# Patient Record
Sex: Female | Born: 1935 | Race: White | Hispanic: No | Smoking: Former smoker
Health system: Southern US, Community
[De-identification: ages and names within clinical notes are randomized; demographics above are authoritative.]

## PROBLEM LIST (undated history)

## (undated) DIAGNOSIS — I10 Essential (primary) hypertension: Secondary | ICD-10-CM

## (undated) DIAGNOSIS — G4730 Sleep apnea, unspecified: Secondary | ICD-10-CM

## (undated) DIAGNOSIS — I255 Ischemic cardiomyopathy: Secondary | ICD-10-CM

## (undated) DIAGNOSIS — G4733 Obstructive sleep apnea (adult) (pediatric): Secondary | ICD-10-CM

## (undated) DIAGNOSIS — E785 Hyperlipidemia, unspecified: Secondary | ICD-10-CM

## (undated) DIAGNOSIS — Z9221 Personal history of antineoplastic chemotherapy: Secondary | ICD-10-CM

## (undated) DIAGNOSIS — R011 Cardiac murmur, unspecified: Secondary | ICD-10-CM

## (undated) DIAGNOSIS — I472 Ventricular tachycardia: Secondary | ICD-10-CM

## (undated) DIAGNOSIS — M549 Dorsalgia, unspecified: Secondary | ICD-10-CM

## (undated) DIAGNOSIS — R9081 Abnormal echoencephalogram: Secondary | ICD-10-CM

## (undated) DIAGNOSIS — Z923 Personal history of irradiation: Secondary | ICD-10-CM

## (undated) DIAGNOSIS — I252 Old myocardial infarction: Secondary | ICD-10-CM

## (undated) DIAGNOSIS — I350 Nonrheumatic aortic (valve) stenosis: Secondary | ICD-10-CM

## (undated) DIAGNOSIS — I4729 Other ventricular tachycardia: Secondary | ICD-10-CM

## (undated) DIAGNOSIS — R001 Bradycardia, unspecified: Secondary | ICD-10-CM

## (undated) DIAGNOSIS — G8929 Other chronic pain: Secondary | ICD-10-CM

## (undated) DIAGNOSIS — I2510 Atherosclerotic heart disease of native coronary artery without angina pectoris: Secondary | ICD-10-CM

## (undated) DIAGNOSIS — G709 Myoneural disorder, unspecified: Secondary | ICD-10-CM

## (undated) DIAGNOSIS — C55 Malignant neoplasm of uterus, part unspecified: Secondary | ICD-10-CM

## (undated) DIAGNOSIS — M1990 Unspecified osteoarthritis, unspecified site: Secondary | ICD-10-CM

## (undated) DIAGNOSIS — Z9889 Other specified postprocedural states: Secondary | ICD-10-CM

## (undated) DIAGNOSIS — I4821 Permanent atrial fibrillation: Secondary | ICD-10-CM

## (undated) DIAGNOSIS — I272 Pulmonary hypertension, unspecified: Secondary | ICD-10-CM

## (undated) DIAGNOSIS — I219 Acute myocardial infarction, unspecified: Secondary | ICD-10-CM

## (undated) DIAGNOSIS — T451X5A Adverse effect of antineoplastic and immunosuppressive drugs, initial encounter: Secondary | ICD-10-CM

## (undated) DIAGNOSIS — I447 Left bundle-branch block, unspecified: Secondary | ICD-10-CM

## (undated) DIAGNOSIS — G620 Drug-induced polyneuropathy: Secondary | ICD-10-CM

## (undated) HISTORY — DX: Myoneural disorder, unspecified: G70.9

## (undated) HISTORY — PX: ABDOMINAL HYSTERECTOMY: SHX81

## (undated) HISTORY — DX: Other specified postprocedural states: Z98.890

## (undated) HISTORY — DX: Abnormal echoencephalogram: R90.81

## (undated) HISTORY — DX: Dorsalgia, unspecified: M54.9

## (undated) HISTORY — DX: Atherosclerotic heart disease of native coronary artery without angina pectoris: I25.10

## (undated) HISTORY — DX: Ventricular tachycardia: I47.2

## (undated) HISTORY — DX: Malignant neoplasm of uterus, part unspecified: C55

## (undated) HISTORY — DX: Hyperlipidemia, unspecified: E78.5

## (undated) HISTORY — DX: Other chronic pain: G89.29

## (undated) HISTORY — DX: Bradycardia, unspecified: R00.1

## (undated) HISTORY — DX: Other ventricular tachycardia: I47.29

---
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
## 2004-01-28 ENCOUNTER — Ambulatory Visit: Payer: Self-pay | Admitting: Unknown Physician Specialty

## 2005-02-23 ENCOUNTER — Ambulatory Visit: Payer: Self-pay | Admitting: Unknown Physician Specialty

## 2006-02-26 ENCOUNTER — Ambulatory Visit: Payer: Self-pay | Admitting: Unknown Physician Specialty

## 2006-09-19 ENCOUNTER — Ambulatory Visit: Payer: Self-pay | Admitting: Gastroenterology

## 2007-03-07 ENCOUNTER — Ambulatory Visit: Payer: Self-pay | Admitting: Unknown Physician Specialty

## 2008-03-09 ENCOUNTER — Ambulatory Visit: Payer: Self-pay | Admitting: Unknown Physician Specialty

## 2009-03-11 ENCOUNTER — Ambulatory Visit: Payer: Self-pay | Admitting: Unknown Physician Specialty

## 2010-01-10 ENCOUNTER — Ambulatory Visit: Payer: Self-pay | Admitting: Unknown Physician Specialty

## 2010-03-14 ENCOUNTER — Ambulatory Visit: Payer: Self-pay | Admitting: Unknown Physician Specialty

## 2010-10-10 ENCOUNTER — Ambulatory Visit: Payer: Self-pay | Admitting: Unknown Physician Specialty

## 2011-04-04 ENCOUNTER — Ambulatory Visit: Payer: Self-pay | Admitting: Unknown Physician Specialty

## 2012-04-04 ENCOUNTER — Ambulatory Visit: Payer: Self-pay | Admitting: Family Medicine

## 2012-10-19 ENCOUNTER — Emergency Department: Payer: Self-pay | Admitting: Emergency Medicine

## 2012-10-19 ENCOUNTER — Ambulatory Visit (HOSPITAL_COMMUNITY): Admit: 2012-10-19 | Payer: Self-pay | Admitting: Cardiovascular Disease

## 2012-10-19 ENCOUNTER — Encounter (HOSPITAL_COMMUNITY)
Admission: RE | Disposition: A | Discharge status: Home / Self Care | Payer: Self-pay | Attending: Cardiovascular Disease

## 2012-10-19 ENCOUNTER — Inpatient Hospital Stay (HOSPITAL_COMMUNITY)
Admission: RE | Admit: 2012-10-19 | Discharge: 2012-10-25 | DRG: 247 | Disposition: A | Discharge status: Home / Self Care | Payer: Medicare Other | Source: Other Acute Inpatient Hospital | Attending: Cardiovascular Disease | Admitting: Cardiovascular Disease

## 2012-10-19 DIAGNOSIS — I252 Old myocardial infarction: Secondary | ICD-10-CM | POA: Diagnosis present

## 2012-10-19 DIAGNOSIS — I2582 Chronic total occlusion of coronary artery: Secondary | ICD-10-CM | POA: Diagnosis present

## 2012-10-19 DIAGNOSIS — I498 Other specified cardiac arrhythmias: Secondary | ICD-10-CM | POA: Diagnosis not present

## 2012-10-19 DIAGNOSIS — I2102 ST elevation (STEMI) myocardial infarction involving left anterior descending coronary artery: Secondary | ICD-10-CM

## 2012-10-19 DIAGNOSIS — I237 Postinfarction angina: Secondary | ICD-10-CM | POA: Diagnosis not present

## 2012-10-19 DIAGNOSIS — E785 Hyperlipidemia, unspecified: Secondary | ICD-10-CM | POA: Diagnosis present

## 2012-10-19 DIAGNOSIS — I472 Ventricular tachycardia, unspecified: Secondary | ICD-10-CM | POA: Diagnosis not present

## 2012-10-19 DIAGNOSIS — I2510 Atherosclerotic heart disease of native coronary artery without angina pectoris: Secondary | ICD-10-CM | POA: Diagnosis not present

## 2012-10-19 DIAGNOSIS — I4729 Other ventricular tachycardia: Secondary | ICD-10-CM | POA: Diagnosis not present

## 2012-10-19 DIAGNOSIS — I2109 ST elevation (STEMI) myocardial infarction involving other coronary artery of anterior wall: Principal | ICD-10-CM

## 2012-10-19 DIAGNOSIS — I10 Essential (primary) hypertension: Secondary | ICD-10-CM | POA: Diagnosis present

## 2012-10-19 DIAGNOSIS — I119 Hypertensive heart disease without heart failure: Secondary | ICD-10-CM | POA: Diagnosis present

## 2012-10-19 DIAGNOSIS — I2589 Other forms of chronic ischemic heart disease: Secondary | ICD-10-CM | POA: Diagnosis present

## 2012-10-19 DIAGNOSIS — I219 Acute myocardial infarction, unspecified: Secondary | ICD-10-CM

## 2012-10-19 DIAGNOSIS — I255 Ischemic cardiomyopathy: Secondary | ICD-10-CM | POA: Diagnosis present

## 2012-10-19 DIAGNOSIS — I495 Sick sinus syndrome: Secondary | ICD-10-CM | POA: Diagnosis not present

## 2012-10-19 HISTORY — DX: Essential (primary) hypertension: I10

## 2012-10-19 HISTORY — PX: LEFT HEART CATHETERIZATION WITH CORONARY ANGIOGRAM: SHX5451

## 2012-10-19 HISTORY — PX: PERCUTANEOUS CORONARY STENT INTERVENTION (PCI-S): SHX5485

## 2012-10-19 LAB — BASIC METABOLIC PANEL
Anion Gap: 5 — ABNORMAL LOW (ref 7–16)
Calcium, Total: 9.9 mg/dL (ref 8.5–10.1)
Chloride: 103 mmol/L (ref 98–107)
Co2: 29 mmol/L (ref 21–32)
EGFR (African American): >60
EGFR (Non-African Amer.): 52 — ABNORMAL LOW
Glucose: 136 mg/dL — ABNORMAL HIGH (ref 65–99)
Potassium: 3.7 mmol/L (ref 3.5–5.1)
Sodium: 137 mmol/L (ref 136–145)

## 2012-10-19 LAB — CBC
HCT: 41.4 % (ref 35.0–47.0)
HGB: 14 g/dL (ref 12.0–16.0)
MCH: 29.8 pg (ref 26.0–34.0)
MCHC: 34 g/dL (ref 32.0–36.0)
Platelet: 258 10*3/uL (ref 150–440)
RBC: 4.71 10*6/uL (ref 3.80–5.20)
RDW: 13.3 % (ref 11.5–14.5)
WBC: 12 10*3/uL — ABNORMAL HIGH (ref 3.6–11.0)

## 2012-10-19 LAB — CK TOTAL AND CKMB (NOT AT ARMC): CK-MB: 63.8 ng/mL — ABNORMAL HIGH (ref 0.5–3.6)

## 2012-10-19 SURGERY — LEFT HEART CATHETERIZATION WITH CORONARY ANGIOGRAM

## 2012-10-19 MED ORDER — NITROGLYCERIN 0.4 MG SL SUBL
0.4000 mg | SUBLINGUAL_TABLET | SUBLINGUAL | Status: DC | PRN
  Administered 2012-10-21 (×2): 0.4 mg via SUBLINGUAL

## 2012-10-19 MED ORDER — TICAGRELOR 90 MG PO TABS
ORAL_TABLET | ORAL | Status: AC
  Administered 2012-10-20: 90 mg via ORAL
  Filled 2012-10-19: qty 2

## 2012-10-19 MED ORDER — FENTANYL CITRATE 0.05 MG/ML IJ SOLN
INTRAMUSCULAR | Status: AC
  Filled 2012-10-19: qty 2

## 2012-10-19 MED ORDER — ONDANSETRON HCL 4 MG/2ML IJ SOLN: 4.0000 mg | Freq: Four times a day (QID) | INTRAMUSCULAR | Status: DC | PRN

## 2012-10-19 MED ORDER — NITROGLYCERIN IN D5W 200-5 MCG/ML-% IV SOLN
INTRAVENOUS | Status: AC
  Filled 2012-10-19: qty 250

## 2012-10-19 MED ORDER — ATORVASTATIN CALCIUM 80 MG PO TABS: 80.0000 mg | ORAL_TABLET | Freq: Every day | ORAL | Status: DC

## 2012-10-19 MED ORDER — HEPARIN (PORCINE) IN NACL 2-0.9 UNIT/ML-% IJ SOLN
INTRAMUSCULAR | Status: AC
  Filled 2012-10-19: qty 1000

## 2012-10-19 MED ORDER — NITROGLYCERIN IN D5W 200-5 MCG/ML-% IV SOLN
2.0000 ug/min | INTRAVENOUS | Status: DC
  Filled 2012-10-19: qty 250

## 2012-10-19 MED ORDER — ASPIRIN EC 81 MG PO TBEC: 81.0000 mg | DELAYED_RELEASE_TABLET | Freq: Every day | ORAL | Status: DC

## 2012-10-19 MED ORDER — ATORVASTATIN CALCIUM 80 MG PO TABS
80.0000 mg | ORAL_TABLET | Freq: Every day | ORAL | Status: DC
  Administered 2012-10-20 – 2012-10-24 (×5): 80 mg via ORAL
  Filled 2012-10-19 (×6): qty 1

## 2012-10-19 MED ORDER — ACETAMINOPHEN 325 MG PO TABS: 650.0000 mg | ORAL_TABLET | ORAL | Status: DC | PRN

## 2012-10-19 MED ORDER — ACETAMINOPHEN 325 MG PO TABS
650.0000 mg | ORAL_TABLET | ORAL | Status: DC | PRN
  Administered 2012-10-20: 650 mg via ORAL
  Filled 2012-10-19: qty 2

## 2012-10-19 MED ORDER — LISINOPRIL 2.5 MG PO TABS
2.5000 mg | ORAL_TABLET | Freq: Every day | ORAL | Status: DC
  Administered 2012-10-20 – 2012-10-24 (×5): 2.5 mg via ORAL
  Filled 2012-10-19 (×6): qty 1

## 2012-10-19 MED ORDER — BIVALIRUDIN 250 MG IV SOLR
INTRAVENOUS | Status: AC
  Filled 2012-10-19: qty 250

## 2012-10-19 MED ORDER — METOPROLOL TARTRATE 25 MG PO TABS
25.0000 mg | ORAL_TABLET | Freq: Two times a day (BID) | ORAL | Status: DC
  Administered 2012-10-20 – 2012-10-23 (×7): 25 mg via ORAL
  Filled 2012-10-19 (×9): qty 1

## 2012-10-19 MED ORDER — TICAGRELOR 90 MG PO TABS
90.0000 mg | ORAL_TABLET | Freq: Two times a day (BID) | ORAL | Status: DC
  Administered 2012-10-20 – 2012-10-23 (×7): 90 mg via ORAL
  Filled 2012-10-19 (×9): qty 1

## 2012-10-19 MED ORDER — SODIUM CHLORIDE 0.9 % IV SOLN
INTRAVENOUS | Status: DC
  Administered 2012-10-20: 02:00:00 via INTRAVENOUS

## 2012-10-19 MED ORDER — NITROGLYCERIN 0.2 MG/ML ON CALL CATH LAB
INTRAVENOUS | Status: AC
  Filled 2012-10-19: qty 1

## 2012-10-19 MED ORDER — ASPIRIN EC 81 MG PO TBEC
81.0000 mg | DELAYED_RELEASE_TABLET | Freq: Every day | ORAL | Status: DC
  Administered 2012-10-20 – 2012-10-23 (×4): 81 mg via ORAL
  Filled 2012-10-19 (×4): qty 1

## 2012-10-19 MED ORDER — ENOXAPARIN SODIUM 40 MG/0.4ML ~~LOC~~ SOLN
40.0000 mg | SUBCUTANEOUS | Status: DC
  Filled 2012-10-19: qty 0.4

## 2012-10-19 MED ORDER — MIDAZOLAM HCL 2 MG/2ML IJ SOLN
INTRAMUSCULAR | Status: AC
  Filled 2012-10-19: qty 2

## 2012-10-19 MED ORDER — SODIUM CHLORIDE 0.9 % IV SOLN: 0.2500 mg/kg/h | INTRAVENOUS | Status: AC

## 2012-10-19 MED ORDER — LIDOCAINE HCL (PF) 1 % IJ SOLN
INTRAMUSCULAR | Status: AC
  Filled 2012-10-19: qty 30

## 2012-10-19 NOTE — Cardiovascular Report (Signed)
Susan [name], Susan [name]              ACCOUNT NO.:  [id number]  MEDICAL RECORD NO.:  [id number]  LOCATION:  2908                         FACILITY:  [facility]  PHYSICIAN:  Nicki Guadalajara, M.D.     DATE OF BIRTH:  November 07, 1934  DATE OF PROCEDURE:  10/19/2012 DATE OF DISCHARGE:                           CARDIAC CATHETERIZATION   PROCEDURES:  Emergent cardiac catheterization:  Cine coronary angiography; percutaneous coronary intervention to a totally occluded left anterior descending coronary artery with percutaneous transluminal coronary angioplasty stenting; left ventriculography.  INDICATIONS:  [name] is a very pleasant 78 year old female who lives in [place], [place].  Apparently, she developed back discomfort since 8 a.m. this morning.  She felt this was due to muscle spasm or strain.  However, the pain kept intensifying throughout the day.  Finally, this evening, she presented to [facility] Emergency Room where she was found to have acute ST-segment anterior wall elevation and a code STEMI was called.  The patient was treated with heparin and sublingual nitroglycerin.  She was transported to the [facility] by EMS and taken directly to the cardiac catheterization laboratory.  DESCRIPTION OF PROCEDURE:  Upon arrival to the catheterization laboratory, the patient was still having at least 5/10 chest, back discomfort.  Her EKG en route had shown at least 4 to 5 mm of ST-segment elevation anteriorly with Q-waves V1 through V5.  She was given 2 mg Versed and 50 mcg of fentanyl for conscious sedation.  Right femoral artery was punctured anteriorly and a 6-French sheath was inserted without difficulty.  Catheterization was done utilizing 6-French diagnostic test FL-4 and FR-4 catheters.  With a demonstration of total proximal-to-mid LAD occlusion with disease in the ostial proximal LAD prior to the total occlusion, we proceeded to perform percutaneous coronary intervention.   Angiomax bolus plus infusion was administered. 180 mg of oral Brilinta was given.  A 6-French XB LAD 3.5 guide was used.  A ChoICE PT wire was advanced into the LAD, was able to cross the total occlusion and was advanced to the LV apex.  A 2.5 x 15 mm Sprinter balloon was dilated at the site of total occlusion.  Two inflations at 16 atmospheres were made.  Since the patient did have significant proximal disease prior to this, it was felt that the entire region needed to be stented.  A 3.0 x 38 mm Promus Premier DES stent was then inserted to cover the most distal aspect of the lesion in the mid segment.  This covered the majority of the proximal stenoses, but there was still an area of proximal stenosis extending to the LAD ostium which needed to be stented.  A 3.0 x 16 mm Promus Premier DES stent was then inserted with careful attention, so as the left circumflex was not jailed.  The stent was advanced to the ostium.  This was dilated x2 up to 12 atmospheres.  A 3.25 x 20 mm noncompliant Trek was used for post stent dilatation in the entire stented segment.  Scout angiography confirmed an excellent angiographic result with all stenoses being reduced to 0%.  There was brisk TIMI-3 flow.  There was no evidence for dissection.  The wire  balloon and catheter were then removed.  A 6- French pigtail catheter was then advanced into the LV.  RAO ventriculography was performed.  An LVAO pullback was performed.  During the procedure, the patient did receive several doses of intracoronary nitroglycerin.  She tolerated the procedure well.  At the end of the procedure, she had stable hemodynamics and then essentially was chest pain free, although did have some mild residual faint back discomfort. The arterial sheath was sutured in place with plans for sheath removal 2 hours after Angiomax is discontinued.  The Angiomax will be continued at reduced dose for 4 additional hours following the acute  intervention.  HEMODYNAMIC DATA:  Upon arrival to the catheterization laboratory, her blood pressure initially was 143/74.  On pullback, left ventricular pressure was 152/21, post A-wave 31, and AO pressure 152/79.  ANGIOGRAPHIC DATA:  Left main coronary artery was angiographically normal and bifurcated into an LAD and left circumflex system.  The LAD had 60% to 70% smooth ostial narrowing followed by 70% and then 80% stenosis before the first septal perforating artery.  The LAD after the septal perforating artery was occluded with initial TIMI 0 flow.  The circumflex vessel was moderate-sized vessel.  This gave rise to upper takeoff marginal branch, but seemed to have some collateral filling from this.  The mid AV groove circumflex had 95% focal stenosis followed by 30% to 40% stenosis before the OM-2 vessel. The right coronary artery was a large caliber very dominant vessel.  It had a large PDA inferior LV branch and large PLA system which extended to the apex.  The proximal portion of the right coronary artery had 50% to 60% narrowing.  There was diffuse narrowing of 20% to 30% in the mid segment.  Following percutaneous coronary intervention with PTCA and stenting of the LAD with Tandem 3.0 x 38 mm stent and the more proximal 3.0 x 16 mm Promus Premier DES stent.  The entire stenoses from the ostium to the point of previous total occlusion were reduced to 0%.  There was brisk TIMI-3 flow.  There was no evidence for dissection.  The patient arrived at the catheterization laboratory at 2039.  Time of the first balloon inflation was 2106, giving an arrival to balloon inflation at 27 minutes.  RAO ventriculography revealed moderate LV acute LV dysfunction with an ejection fraction of approximately 35% to 40%.  There was severe hypo-to-akinesis involving the mid distal anterolateral wall, extending around the apex into the apical inferior segment with possible small region of apical  dyskinesis.  The basal segments were vigorously hypercontractile.  IMPRESSION: 1. Acute ST-segment elevation myocardial infarction secondary to total     occlusion of the proximal to mid left anterior descending artery. 2. Significant multivessel coronary artery disease with segmental left     anterior descending coronary artery narrowings of 60% to 70%     ostially, 70% and 80% prior to the septal perforating artery with     total occlusion after the septal perforating artery with initial     TIMI 0 flow. 3. 95% mid AV groove circumflex stenosis. 4. Large dominant right coronary artery with 50% to 60% proximal     stenosis and 30% irregularity in the mid segment in a very large     right coronary artery. 5. Successful percutaneous coronary intervention of the left anterior     descending artery with percutaneous transluminal coronary     angioplasty and ultimate insertion of Tandem 3.0 x 38 mm  and 3.0 x     16 mm Promus Premier DES stents with all lesions being reduced to     0% and resulted in TIMI-3 flow. 6. Total balloon time 27 minutes. 7. Anticoagulation with IV Angiomax and 180 mg of oral Brilinta. 8. Acute LV dysfunction with ejection fraction of 35% to 40% with     extensive mid distal anterolateral, apical, and apical inferior     severe hypocontractility, hypo-to-akinesis with small area of     possible apical dyskinesis.         ______________________________ Nicki Guadalajara, M.D.    TK/MEDQ  D:  10/19/2012  T:  10/19/2012  Job:  [id number]

## 2012-10-19 NOTE — Progress Notes (Signed)
Pt unable to void on bedpan. Bladder scan done 670 ml noted on scan. Dr. Tresa Endo at bedside. Okay to insert FC until BR is up.

## 2012-10-19 NOTE — H&P (Signed)
Susan [name] is an 78 y.o. female.    Chief Complaint: Chest Pain  HPI: 78 y/o female with a PMH of HTN presenting as a transfer from [facility] where she was originally seen for chest pain evaluation. Her chest pain started about 8 am on the day of presentation. It is described as a sharp pain 6/10 in severity and associated with shortness of breath and nausea.  She denied diaphoresis, palpitation or syncope.  At the outside hospital her EKG obtained 10/19/2012 at 20:35 showed sinus rhythm with 1st degree AVB and 3 mm ST elevation is leads V2-V5.  She was subsequently transferred to [facility] cath lab in consideration of cardiac catheterization. She is currently complaining of 5/10 chest pain, but is hemodynamically stable.  No past medical history on file. HTN  No past surgical history on file. None  No family history on file. Social History:  has no tobacco, alcohol, and drug history on file. Denies tobacco abuse of IV drug abuse  Allergies: Allergies not on file Penicillin   No prescriptions prior to admission   Medication: Not available  No results found for this or any previous visit (from the past 48 hour(s)). No results found. Labs currently pending  Review of Systems  Constitutional: Negative for fever, chills, weight loss, malaise/fatigue and diaphoresis.  HENT: Negative for hearing loss, ear pain, nosebleeds, congestion, sore throat, neck pain, tinnitus and ear discharge.   Eyes: Negative for pain.  Respiratory: Positive for shortness of breath. Negative for cough, hemoptysis, sputum production and wheezing.   Cardiovascular: Positive for chest pain. Negative for palpitations, orthopnea, claudication, leg swelling and PND.  Gastrointestinal: Positive for nausea. Negative for heartburn, vomiting, abdominal pain, diarrhea and constipation.  Genitourinary: Negative for dysuria, urgency, frequency, hematuria and flank pain.   Musculoskeletal: Negative for myalgias and back pain.  Skin: Negative for itching and rash.  Neurological: Negative for dizziness, tingling, tremors, sensory change, speech change, focal weakness, weakness and headaches.  Psychiatric/Behavioral: Negative for hallucinations.   T 97.8  BP 155/78 P 72 R18 O2 saturation 95% on 2 L nasal canula  There were no vitals taken for this visit. Physical Exam  Constitutional: She is oriented to person, place, and time. She appears well-developed and well-nourished. No distress.  HENT:  Head: Normocephalic.  Eyes: Pupils are equal, round, and reactive to light. Right eye exhibits no discharge. Left eye exhibits no discharge. No scleral icterus.  Neck: No JVD present. No tracheal deviation present. No thyromegaly present.  Cardiovascular: Normal rate, regular rhythm and normal heart sounds.  Exam reveals no gallop and no friction rub.   Respiratory: Effort normal and breath sounds normal. No stridor. No respiratory distress. She has no wheezes. She has no rales. She exhibits no tenderness.  GI: Soft. Bowel sounds are normal. She exhibits no distension. There is no tenderness. There is no rebound and no guarding.  Musculoskeletal: She exhibits no edema and no tenderness.  Neurological: She is alert and oriented to person, place, and time. No cranial nerve deficit. Coordination normal.  Skin: No rash noted. She is not diaphoretic. No erythema.  Psychiatric: She has a normal mood and affect.     Assessment/Plan  1. STEMI: Patient was transferred from [facility] strait to our cardiac catheterization lab for diagnostic and interventional procedure.  She received Heparin and SL nitroglycerin en-route to [facility], and she is hemodynamically stable.  We will start patient on dual anti-platelet medication, Metoprolol 25  mg bid, Lipitor 80 mg qhs and prn Nitrates for pain control.  We will obtain CBC, CMP, serial cardiac markers and obtain a TTE  in the morning to evaluate her LV function.    2.  HTN: Currently elevated in the setting of chest pain. We will start Lisinopril and re-evaluate after cardiac catheterization.    Dhilan Brauer E 10/19/2012, 9:18 PM

## 2012-10-20 ENCOUNTER — Encounter (HOSPITAL_COMMUNITY): Payer: Self-pay | Admitting: *Deleted

## 2012-10-20 DIAGNOSIS — I2109 ST elevation (STEMI) myocardial infarction involving other coronary artery of anterior wall: Secondary | ICD-10-CM

## 2012-10-20 DIAGNOSIS — I119 Hypertensive heart disease without heart failure: Secondary | ICD-10-CM | POA: Diagnosis present

## 2012-10-20 DIAGNOSIS — I10 Essential (primary) hypertension: Secondary | ICD-10-CM

## 2012-10-20 DIAGNOSIS — I252 Old myocardial infarction: Secondary | ICD-10-CM | POA: Diagnosis present

## 2012-10-20 DIAGNOSIS — I517 Cardiomegaly: Secondary | ICD-10-CM

## 2012-10-20 LAB — TSH: TSH: 0.754 u[IU]/mL (ref 0.350–4.500)

## 2012-10-20 LAB — T4, FREE: Free T4: 1.01 ng/dL (ref 0.80–1.80)

## 2012-10-20 LAB — CBC
MCH: 30 pg (ref 26.0–34.0)
MCH: 30 pg (ref 26.0–34.0)
MCHC: 34.1 g/dL (ref 30.0–36.0)
MCV: 87.8 fL (ref 78.0–100.0)
MCV: 87.9 fL (ref 78.0–100.0)
Platelets: 221 10*3/uL (ref 150–400)
Platelets: 236 10*3/uL (ref 150–400)
RBC: 4.34 MIL/uL (ref 3.87–5.11)
RDW: 13.1 % (ref 11.5–15.5)
WBC: 11.1 10*3/uL — ABNORMAL HIGH (ref 4.0–10.5)

## 2012-10-20 LAB — LIPID PANEL
Cholesterol: 170 mg/dL (ref 0–200)
HDL: 40 mg/dL (ref >39)
Total CHOL/HDL Ratio: 4.3 RATIO
Triglycerides: 202 mg/dL — ABNORMAL HIGH (ref <150)
VLDL: 40 mg/dL (ref 0–40)

## 2012-10-20 LAB — COMPREHENSIVE METABOLIC PANEL
ALT: 25 U/L (ref 0–35)
AST: 148 U/L — ABNORMAL HIGH (ref 0–37)
Albumin: 3.6 g/dL (ref 3.5–5.2)
Alkaline Phosphatase: 88 U/L (ref 39–117)
BUN: 18 mg/dL (ref 6–23)
Chloride: 100 mEq/L (ref 96–112)
Potassium: 4.1 mEq/L (ref 3.5–5.1)
Sodium: 134 mEq/L — ABNORMAL LOW (ref 135–145)
Total Bilirubin: 0.3 mg/dL (ref 0.3–1.2)
Total Protein: 6.5 g/dL (ref 6.0–8.3)

## 2012-10-20 LAB — BASIC METABOLIC PANEL
CO2: 27 mEq/L (ref 19–32)
Calcium: 9.6 mg/dL (ref 8.4–10.5)
Creatinine, Ser: 0.82 mg/dL (ref 0.50–1.10)
GFR calc non Af Amer: 68 mL/min — ABNORMAL LOW (ref >90)
Glucose, Bld: 109 mg/dL — ABNORMAL HIGH (ref 70–99)
Sodium: 136 mEq/L (ref 135–145)

## 2012-10-20 LAB — HEMOGLOBIN A1C: Mean Plasma Glucose: 117 mg/dL — ABNORMAL HIGH (ref <117)

## 2012-10-20 LAB — TROPONIN I
Troponin I: >20 ng/mL (ref <0.30)
Troponin I: >20 ng/mL (ref <0.30)
Troponin I: >20 ng/mL (ref <0.30)

## 2012-10-20 LAB — MRSA PCR SCREENING: MRSA by PCR: NEGATIVE

## 2012-10-20 MED ORDER — ATROPINE SULFATE 1 MG/ML IJ SOLN
INTRAMUSCULAR | Status: AC
  Filled 2012-10-20: qty 1

## 2012-10-20 MED ORDER — MORPHINE SULFATE 2 MG/ML IJ SOLN: 1.0000 mg | INTRAMUSCULAR | Status: DC | PRN

## 2012-10-20 NOTE — Plan of Care (Signed)
Attempted to place [facility] x2 with 2 different nurses, attempts  unsuccessful. Pt wanted to try to use bedpan again. Pt was able to void 700 ml of urine at this time. Foley cath not placed. Will continue to monitor. Peri care performed before and after each cath attempt. Pt tolerated well.

## 2012-10-20 NOTE — Progress Notes (Signed)
Utilization Review Completed.Susan [name] T7/27/2014  

## 2012-10-20 NOTE — Progress Notes (Signed)
*  PRELIMINARY RESULTS* Echocardiogram 2D Echocardiogram has been performed.  Jeryl Columbia 10/20/2012, 11:24 AM

## 2012-10-20 NOTE — Progress Notes (Signed)
DAILY PROGRESS NOTE  Subjective:  Events noted overnight - anterior STEMI.  She had resolution of her upper back pain after cath (it was 9/10 originally) ... This morning her pain has returned (4/10) and she is hypotensive.  She had PCI to the LAD, but had residual LCx disease, which was a small vessel.  Objective:  Temp:  [97.7 F (36.5 C)-97.8 F (36.6 C)] 97.7 F (36.5 C) (07/27 0800) Pulse Rate:  [43-68] 55 (07/27 0900) Resp:  [11-22] 15 (07/27 0900) BP: (86-142)/(36-88) 86/44 mmHg (07/27 0900) SpO2:  [94 %-100 %] 99 % (07/27 0900) Weight:  [226 lb 3.1 oz (102.6 kg)] 226 lb 3.1 oz (102.6 kg) (07/26 2300) Weight change:   Intake/Output from previous day: 07/26 0701 - 07/27 0700 In: 984.2 [P.O.:240; I.V.:744.2] Out: 700 [Urine:700]  Intake/Output from this shift: Total I/O In: 270 [P.O.:120; I.V.:150] Out: -   Medications: Current Facility-Administered Medications  Medication Dose Route Frequency Provider Last Rate Last Dose  . 0.9 %  sodium chloride infusion   Intravenous Continuous Lennette Bihari, MD 75 mL/hr at 10/20/12 0143    . acetaminophen (TYLENOL) tablet 650 mg  650 mg Oral Q4H PRN Lennette Bihari, MD      . aspirin EC tablet 81 mg  81 mg Oral Daily Lennette Bihari, MD      . atorvastatin (LIPITOR) tablet 80 mg  80 mg Oral q1800 Lennette Bihari, MD      . lisinopril (PRINIVIL,ZESTRIL) tablet 2.5 mg  2.5 mg Oral Daily Lennette Bihari, MD   2.5 mg at 10/20/12 0020  . metoprolol tartrate (LOPRESSOR) tablet 25 mg  25 mg Oral BID Lennette Bihari, MD   25 mg at 10/20/12 0019  . nitroGLYCERIN (NITROSTAT) SL tablet 0.4 mg  0.4 mg Sublingual Q5 Min x 3 PRN Lennette Bihari, MD      . nitroGLYCERIN 0.2 mg/mL in dextrose 5 % infusion  2-200 mcg/min Intravenous Continuous Lennette Bihari, MD   10 mcg/min at 10/20/12 0200  . ondansetron (ZOFRAN) injection 4 mg  4 mg Intravenous Q6H PRN Lennette Bihari, MD      . Ticagrelor [facility]) tablet 90 mg  90 mg Oral BID Lennette Bihari, MD   90  mg at 10/20/12 0020    Physical Exam: General appearance: alert and mild distress Neck: no adenopathy, no carotid bruit, no JVD, supple, symmetrical, trachea midline and thyroid not enlarged, symmetric, no tenderness/mass/nodules Lungs: clear to auscultation bilaterally Heart: bradycardic Abdomen: soft, non-tender; bowel sounds normal; no masses,  no organomegaly Extremities: extremities normal, atraumatic, no cyanosis or edema Pulses: 2+ and symmetric Skin: Skin color, texture, turgor normal. No rashes or lesions Neurologic: Grossly normal  Lab Results: Results for orders placed during the hospital encounter of 10/19/12 (from the past 48 hour(s))  MRSA PCR SCREENING     Status: None   Collection Time    10/19/12 10:43 PM      Result Value Range   MRSA by PCR NEGATIVE  NEGATIVE   Comment:            The GeneXpert MRSA Assay (FDA     approved for NASAL specimens     only), is one component of a     comprehensive MRSA colonization     surveillance program. It is not     intended to diagnose MRSA     infection nor to guide or     monitor treatment for  MRSA infections.  TROPONIN I     Status: Abnormal   Collection Time    10/19/12 11:50 PM      Result Value Range   Troponin I >20.00 (*) <0.30 ng/mL   Comment:            Due to the release kinetics of cTnI,     a negative result within the first hours     of the onset of symptoms does not rule out     myocardial infarction with certainty.     If myocardial infarction is still suspected,     repeat the test at appropriate intervals.     CRITICAL RESULT CALLED TO, READ BACK BY AND VERIFIED WITH:     STOWE [facility] 10/20/12 0042 WAYK  PROTIME-INR     Status: Abnormal   Collection Time    10/19/12 11:50 PM      Result Value Range   Prothrombin Time 20.9 (*) 11.6 - 15.2 seconds   INR 1.86 (*) 0.00 - 1.49  APTT     Status: Abnormal   Collection Time    10/19/12 11:50 PM      Result Value Range   aPTT 91 (*) 24 - 37 seconds    Comment:            IF BASELINE aPTT IS ELEVATED,     SUGGEST PATIENT RISK ASSESSMENT     BE USED TO DETERMINE APPROPRIATE     ANTICOAGULANT THERAPY.  COMPREHENSIVE METABOLIC PANEL     Status: Abnormal   Collection Time    10/19/12 11:50 PM      Result Value Range   Sodium 134 (*) 135 - 145 mEq/L   Potassium 4.1  3.5 - 5.1 mEq/L   Chloride 100  96 - 112 mEq/L   CO2 25  19 - 32 mEq/L   Glucose, Bld 128 (*) 70 - 99 mg/dL   BUN 18  6 - 23 mg/dL   Creatinine, Ser 4.13  0.50 - 1.10 mg/dL   Calcium 9.7  8.4 - 24.4 mg/dL   Total Protein 6.5  6.0 - 8.3 g/dL   Albumin 3.6  3.5 - 5.2 g/dL   AST 010 (*) 0 - 37 U/L   ALT 25  0 - 35 U/L   Alkaline Phosphatase 88  39 - 117 U/L   Total Bilirubin 0.3  0.3 - 1.2 mg/dL   GFR calc non Af Amer 80 (*) >90 mL/min   GFR calc Af Amer >90  >90 mL/min   Comment:            The eGFR has been calculated     using the CKD EPI equation.     This calculation has not been     validated in all clinical     situations.     eGFR's persistently     <90 mL/min signify     possible Chronic Kidney Disease.  MAGNESIUM     Status: None   Collection Time    10/19/12 11:50 PM      Result Value Range   Magnesium 2.1  1.5 - 2.5 mg/dL  PRO B NATRIURETIC PEPTIDE     Status: Abnormal   Collection Time    10/19/12 11:50 PM      Result Value Range   Pro B Natriuretic peptide (BNP) 1299.0 (*) 0 - 450 pg/mL  CBC     Status: Abnormal   Collection Time    10/19/12 11:50  PM      Result Value Range   WBC 11.1 (*) 4.0 - 10.5 K/uL   RBC 4.34  3.87 - 5.11 MIL/uL   Hemoglobin 13.0  12.0 - 15.0 g/dL   HCT 16.1  09.6 - 04.5 %   MCV 87.8  78.0 - 100.0 fL   MCH 30.0  26.0 - 34.0 pg   MCHC 34.1  30.0 - 36.0 g/dL   RDW 40.9  81.1 - 91.4 %   Platelets 236  150 - 400 K/uL  LIPID PANEL     Status: Abnormal   Collection Time    10/20/12  4:20 AM      Result Value Range   Cholesterol 170  0 - 200 mg/dL   Triglycerides 782 (*) <150 mg/dL   HDL 40  >95 mg/dL   Total  CHOL/HDL Ratio 4.3     VLDL 40  0 - 40 mg/dL   LDL Cholesterol 90  0 - 99 mg/dL   Comment:            Total Cholesterol/HDL:CHD Risk     Coronary Heart Disease Risk Table                         Men   Women      1/2 Average Risk   3.4   3.3      Average Risk       5.0   4.4      2 X Average Risk   9.6   7.1      3 X Average Risk  23.4   11.0                Use the calculated Patient Ratio     above and the CHD Risk Table     to determine the patient's CHD Risk.                ATP III CLASSIFICATION (LDL):      <100     mg/dL   Optimal      621-308  mg/dL   Near or Above                        Optimal      130-159  mg/dL   Borderline      657-846  mg/dL   High      >962     mg/dL   Very High  CBC     Status: None   Collection Time    10/20/12  4:20 AM      Result Value Range   WBC 9.6  4.0 - 10.5 K/uL   RBC 4.20  3.87 - 5.11 MIL/uL   Hemoglobin 12.6  12.0 - 15.0 g/dL   HCT 95.2  84.1 - 32.4 %   MCV 87.9  78.0 - 100.0 fL   MCH 30.0  26.0 - 34.0 pg   MCHC 34.1  30.0 - 36.0 g/dL   RDW 40.1  02.7 - 25.3 %   Platelets 221  150 - 400 K/uL  BASIC METABOLIC PANEL     Status: Abnormal   Collection Time    10/20/12  4:20 AM      Result Value Range   Sodium 136  135 - 145 mEq/L   Potassium 3.9  3.5 - 5.1 mEq/L   Chloride 101  96 - 112 mEq/L   CO2 27  19 -  32 mEq/L   Glucose, Bld 109 (*) 70 - 99 mg/dL   BUN 17  6 - 23 mg/dL   Creatinine, Ser 1.61  0.50 - 1.10 mg/dL   Calcium 9.6  8.4 - 09.6 mg/dL   GFR calc non Af Amer 68 (*) >90 mL/min   GFR calc Af Amer 79 (*) >90 mL/min   Comment:            The eGFR has been calculated     using the CKD EPI equation.     This calculation has not been     validated in all clinical     situations.     eGFR's persistently     <90 mL/min signify     possible Chronic Kidney Disease.    Imaging: No results found.  Assessment:  Principal Problem:   ST elevation (STEMI) myocardial infarction involving left anterior descending  coronary artery Active Problems:   HTN (hypertension)   Plan:  1. Anterior STEMI with resolution of upper back/chest pain, however, it may have returned. EKG this am shows worsening ST elevation in V2-V5 (about 2mm higher) than the post-PCI EKG. Awaiting cardiac enyzymes. She is hypotensive and bradycardic. Will d/w Dr. Tresa Endo, however, may have to go back to the cath lab this am. Please keep NPO.  Time Spent Directly with Patient:  15 minutes  Length of Stay:  LOS: 1 day   Chrystie Nose, MD, [facility] Attending Cardiologist [facility]  HILTY,Kenneth C 10/20/2012, 9:13 AM

## 2012-10-20 NOTE — Progress Notes (Signed)
Reviewed recurrent EKG's with Dr. Tresa Endo (interventional cardiology). He has examined the patient and feels that she is has not occluded the stent, despite mild re-development of upper back pain and increased ST segments. He suspects this could be evolving LV aneurysm.  Troponin remains >20. Will Rx for low dose morphine for pain.  Chrystie Nose, MD, [facility] Attending Cardiologist [facility]

## 2012-10-20 NOTE — Progress Notes (Signed)
Right femoral 6 FR sheath removed. Manual pressure held for 30 mins., hemostasis achieved. Pt had Slight oozing at beginning of pull. Vital signs remained stable during procedure (See flowsheet). Gauze pressure dressing applied. Groin site level 0 before and after pull. Pt educated on site care. Pt demonstrated and verbalized understanding. Pt tolerated well. Will monitor.

## 2012-10-20 NOTE — CV Procedure (Addendum)
This procedure was performed by Dr. Tresa Endo, the note was mistakenly placed in the wrong chart. I have moved it to the proper chart and deleted the incorrectly placed note.  -Dr. Rennis Golden   Emergency Catheterization/PCI of LAD  Susan [name], 78 y.o., female  Full note dictated; see diagram in chart  DICTATION # [id number]  Ao: 152/79 LV: 152/21/31  LM: nl LAD: 60 - 70% ostial, 70 and 80 % proximal stenosis before SP1 and totally occluded post septal with TIMI  0 flow. LCX: 95% mid AV groove between OM1 and OM2 RCA: large dominant vessel with 50 - 60% proximal stenosis and 30% mid stenosis.  PCI of LAD: 6 F XB 3.5 LAD guide, Choice PT wire, 2.5 x 15 Sprinter balloon, and tandem 3.0 x 38 and 3.0 x 16 Promus Premier DES stents post dilated with 3.25 x 20 [place] Trek from mid LAD to ostium with all lesions reduced to 0% and resumption of TIMI 3 flow.   LV fxn: EF 35 - 405 with severe hypo-ak of mid-distal anterolateral wall extending around apex to inferoapical segment with possible focal apical dyskinesis.  DTB time from cath lab arrival: 27 minutes  Angiomax, 180 mg brilinta, NTG.   May need staged PCI to LCX lesion.  Lennette Bihari., MD, [facility] 10/20/2012 8:25 AM

## 2012-10-21 DIAGNOSIS — I237 Postinfarction angina: Secondary | ICD-10-CM | POA: Diagnosis not present

## 2012-10-21 DIAGNOSIS — I209 Angina pectoris, unspecified: Secondary | ICD-10-CM

## 2012-10-21 LAB — BASIC METABOLIC PANEL
BUN: 17 mg/dL (ref 6–23)
CO2: 29 mEq/L (ref 19–32)
Chloride: 103 mEq/L (ref 96–112)
Glucose, Bld: 125 mg/dL — ABNORMAL HIGH (ref 70–99)
Potassium: 3.8 mEq/L (ref 3.5–5.1)

## 2012-10-21 MED ORDER — SODIUM CHLORIDE 0.9 % IV SOLN: INTRAVENOUS | Status: DC | PRN

## 2012-10-21 MED ORDER — HEPARIN BOLUS VIA INFUSION
4000.0000 [IU] | Freq: Once | INTRAVENOUS | Status: AC
  Administered 2012-10-21: 4000 [IU] via INTRAVENOUS
  Filled 2012-10-21: qty 4000

## 2012-10-21 MED ORDER — MAGNESIUM HYDROXIDE 400 MG/5ML PO SUSP
30.0000 mL | Freq: Every day | ORAL | Status: DC | PRN
  Administered 2012-10-21: 30 mL via ORAL
  Filled 2012-10-21: qty 30

## 2012-10-21 MED ORDER — HEPARIN (PORCINE) IN NACL 100-0.45 UNIT/ML-% IJ SOLN
1300.0000 [IU]/h | INTRAMUSCULAR | Status: DC
  Administered 2012-10-21: 1100 [IU]/h via INTRAVENOUS
  Administered 2012-10-22: 1300 [IU]/h via INTRAVENOUS
  Filled 2012-10-21 (×2): qty 250

## 2012-10-21 MED ORDER — NITROGLYCERIN IN D5W 200-5 MCG/ML-% IV SOLN
2.0000 ug/min | INTRAVENOUS | Status: DC
  Administered 2012-10-21: 10 ug/min via INTRAVENOUS

## 2012-10-21 MED FILL — Sodium Chloride IV Soln 0.9%: INTRAVENOUS | Qty: 50 | Status: AC

## 2012-10-21 NOTE — Progress Notes (Signed)
ANTICOAGULATION CONSULT NOTE - Initial Consult  Pharmacy Consult for Heparin Indication: chest pain/ACS  Allergies  Allergen Reactions  . Penicillins Rash    Patient Measurements: Height: 5\' 5"  (165.1 cm) Weight: 226 lb 3.1 oz (102.6 kg) IBW/kg (Calculated) : 57 Heparin Dosing Weight: 83kg  Vital Signs: Temp: 98.2 F (36.8 C) (07/28 1251) Temp src: Oral (07/28 1251) BP: 109/43 mmHg (07/28 1510)  Labs:  Recent Labs  10/19/12 2350 10/20/12 0420  10/20/12 1421 10/20/12 2002 10/21/12 1058  HGB 13.0 12.6  --   --   --   --   HCT 38.1 36.9  --   --   --   --   PLT 236 221  --   --   --   --   APTT 91*  --   --   --   --   --   LABPROT 20.9*  --   --   --   --   --   INR 1.86*  --   --   --   --   --   CREATININE 0.78 0.82  --   --   --  0.90  TROPONINI >20.00*  --   < > >20.00* >20.00* >20.00*  < > = values in this interval not displayed.  Estimated Creatinine Clearance: 64.1 ml/min (by C-G formula based on Cr of 0.9).   Medical History: Past Medical History  Diagnosis Date  . Hypertension       Assessment: 77yof admitted Friday 7/25 for STEMI and had PCI to LAD. She also had residual disease in LCx.  Angiomax was continued post cath x4hr and Brilinta started.  CBC stable as of 7/27.  Today she developed CP with ECK changes and decision to go back to the cath is being made by cardiologist.  Heparin drip will be restarted with a bolus since new CP and ECG changes and her sheath was pulled on Saturday with no bleeding per RN. Goal of Therapy:  Heparin level 0.3-0.7 units/ml Monitor platelets by anticoagulation protocol: Yes   Plan:  Heparin bolus 4000 uts IV x1 Heparin drip 1100 uts/hr 6hr HL if pt not in cath lab Daily HL and CBC if heparin continues  Leota Sauers Pharm.D. CPP, BCPS Clinical Pharmacist [phone] 10/21/2012 3:55 PM

## 2012-10-21 NOTE — Progress Notes (Signed)
ANTICOAGULATION CONSULT NOTE Pharmacy Consult for Heparin Indication: chest pain/ACS  Allergies  Allergen Reactions  . Penicillins Rash    Patient Measurements: Height: 5\' 5"  (165.1 cm) Weight: 226 lb 3.1 oz (102.6 kg) IBW/kg (Calculated) : 57 Heparin Dosing Weight: 83kg  Vital Signs: Temp: 98.3 F (36.8 C) (07/28 2024) Temp src: Oral (07/28 2024) BP: 131/72 mmHg (07/28 2303)  Labs:  Recent Labs  10/19/12 2350 10/20/12 0420  10/20/12 1421 10/20/12 2002 10/21/12 1058 10/21/12 2307  HGB 13.0 12.6  --   --   --   --   --   HCT 38.1 36.9  --   --   --   --   --   PLT 236 221  --   --   --   --   --   APTT 91*  --   --   --   --   --   --   LABPROT 20.9*  --   --   --   --   --   --   INR 1.86*  --   --   --   --   --   --   HEPARINUNFRC  --   --   --   --   --   --  0.27*  CREATININE 0.78 0.82  --   --   --  0.90  --   TROPONINI >20.00*  --   < > >20.00* >20.00* >20.00*  --   < > = values in this interval not displayed.  Estimated Creatinine Clearance: 64.1 ml/min (by C-G formula based on Cr of 0.9).  Assessment: 78 y.o. female with chest pain for heparin   Goal of Therapy:  Heparin level 0.3-0.7 units/ml Monitor platelets by anticoagulation protocol: Yes   Plan:  Increase Heparin 1300 units/hr Follow-up am labs.  Geannie Risen, PharmD, BCPS 10/21/2012 11:39 PM

## 2012-10-21 NOTE — Progress Notes (Signed)
Subjective: No further back pain  Objective: Vital signs in last 24 hours: Temp:  [98.2 F (36.8 C)-99.9 F (37.7 C)] 98.2 F (36.8 C) (07/28 0810) Pulse Rate:  [55-59] 55 (07/27 1400) Resp:  [10-24] 18 (07/28 0400) BP: (88-123)/(37-69) 88/43 mmHg (07/28 0400) SpO2:  [94 %-100 %] 96 % (07/28 0810) Last BM Date: 10/19/12  Intake/Output from previous day: 07/27 0701 - 07/28 0700 In: 1290 [P.O.:840; I.V.:450] Out: 800 [Urine:800] Intake/Output this shift:    Medications Current Facility-Administered Medications  Medication Dose Route Frequency Provider Last Rate Last Dose  . 0.9 %  sodium chloride infusion   Intravenous Continuous Lennette Bihari, MD      . acetaminophen (TYLENOL) tablet 650 mg  650 mg Oral Q4H PRN Lennette Bihari, MD   650 mg at 10/20/12 2232  . aspirin EC tablet 81 mg  81 mg Oral Daily Lennette Bihari, MD   81 mg at 10/20/12 1016  . atorvastatin (LIPITOR) tablet 80 mg  80 mg Oral q1800 Lennette Bihari, MD   80 mg at 10/20/12 1739  . lisinopril (PRINIVIL,ZESTRIL) tablet 2.5 mg  2.5 mg Oral Daily Lennette Bihari, MD   2.5 mg at 10/20/12 0020  . metoprolol tartrate (LOPRESSOR) tablet 25 mg  25 mg Oral BID Lennette Bihari, MD   25 mg at 10/20/12 2233  . morphine 2 MG/ML injection 1 mg  1 mg Intravenous Q2H PRN Chrystie Nose, MD      . nitroGLYCERIN (NITROSTAT) SL tablet 0.4 mg  0.4 mg Sublingual Q5 Min x 3 PRN Lennette Bihari, MD      . nitroGLYCERIN 0.2 mg/mL in dextrose 5 % infusion  2-200 mcg/min Intravenous Continuous Lennette Bihari, MD   10 mcg/min at 10/20/12 0200  . ondansetron (ZOFRAN) injection 4 mg  4 mg Intravenous Q6H PRN Lennette Bihari, MD      . Ticagrelor [facility]) tablet 90 mg  90 mg Oral BID Lennette Bihari, MD   90 mg at 10/20/12 2233    PE: General appearance: alert, cooperative and no distress Lungs: clear to auscultation bilaterally Heart: regular rate and rhythm and 1/6 sys MM Extremities: No LEE Pulses: 2+ and symmetric Skin: warm and  dry Neurologic: Grossly normal  Lab Results:   Recent Labs  10/19/12 2350 10/20/12 0420  WBC 11.1* 9.6  HGB 13.0 12.6  HCT 38.1 36.9  PLT 236 221   BMET  Recent Labs  10/19/12 2350 10/20/12 0420  NA 134* 136  K 4.1 3.9  CL 100 101  CO2 25 27  GLUCOSE 128* 109*  BUN 18 17  CREATININE 0.78 0.82  CALCIUM 9.7 9.6   PT/INR  Recent Labs  10/19/12 2350  LABPROT 20.9*  INR 1.86*   Cholesterol  Recent Labs  10/20/12 0420  CHOL 170   Lipid Panel     Component Value Date/Time   CHOL 170 10/20/2012 0420   TRIG 202* 10/20/2012 0420   HDL 40 10/20/2012 0420   CHOLHDL 4.3 10/20/2012 0420   VLDL 40 10/20/2012 0420   LDLCALC 90 10/20/2012 0420   Cardiac Panel (last 3 results)  Recent Labs  10/20/12 0814 10/20/12 1421 10/20/12 2002  TROPONINI >20.00* >20.00* >20.00*   DICTATION # [id number]  Ao: 152/79  LV: 152/21/31  LM: nl  LAD: 60 - 70% ostial, 70 and 80 % proximal stenosis before SP1 and totally occluded post septal with TIMI 0 flow.  LCX:  95% mid AV groove between OM1 and OM2  RCA: large dominant vessel with 50 - 60% proximal stenosis and 30% mid stenosis.  PCI of LAD: 6 F XB 3.5 LAD guide, Choice PT wire, 2.5 x 15 Sprinter balloon, and tandem 3.0 x 38 and 3.0 x 16 Promus Premier DES stents post dilated with 3.25 x 20 [place] from mid LAD to ostium with all lesions reduced to 0% and resumption of TIMI 3 flow.  LV fxn: EF 35 - 405 with severe hypo-ak of mid-distal anterolateral wall extending around apex to inferoapical segment with possible focal apical dyskinesis.  DTB time from cath lab arrival: 27 minutes  Angiomax, 180 mg brilinta, NTG.  May need staged PCI to LCX lesion.  Lennette Bihari., MD, [facility]  10/20/2012  Assessment/Plan  Principal Problem:   ST elevation (STEMI) myocardial infarction involving left anterior descending coronary artery Active Problems:   HTN (hypertension)  Plan:  SP STEMI with culprit vessel being the LAD.  54mm of  Promus Premier DES place.  LCX also with 95% mid AV groove stenosis.  May need staged intervention.  Last troponin >20.00.  Rechecking now.  Hypotensive 88/43 but BP has improve now.  On ASA, lisinopril 2.5, lopressor 12.5, brilinta, lipitor.   Some 2-3 beat wide NSVT.  Will need to try and titrate lopressor.  1-2 more days.        LOS: 2 days    HAGER, BRYAN 10/21/2012 10:17 AM  I have seen and evaluated the patient this PM along with Wilburt Finlay, PA. I agree with his findings, examination as well as impression recommendations. Unfortunately this PM, she developed recurrent (now R upper neck/back pain) (MI angina was upper L side back pain).   Minimally responsive to NTG SL - 6 -3 /10 after 2nd NTG.    She had similar, more severe Sx yesterday with worsening of the ECG changes.  ECG looks stable now -- evolving ST-T changes for Anterior STEMI.    What is not clear here is ?? Do we need to do PCI of Cx sooner than later?  Will review films & reassess -> restart NTG gtt & IV Heparin.  BP & HR stable - low dose BB & ACE-I; on DAPT & Statin   MD Time with pt: 10 min  Alphonza Tramell W, M.D., M.S. [facility] [address] [place], [place]  [address]  [phone] Pager # [phone] 10/21/2012 3:27 PM

## 2012-10-21 NOTE — Progress Notes (Signed)
Pt recently having back pain again per RN. Getting EKG. Will f/u in am. Ethelda Chick CES, ACSM 2:38 PM 10/21/2012

## 2012-10-21 NOTE — Progress Notes (Signed)
I went back this PM to re-check on the patient after initiating IV Heparin & NTG.  Currently pain free with no complaints. BP & HR stable.  I have reviewed the cath films -- small caliber distal Cx with focal lesion, would prefer to avoid taking her to the cath lab this PM in order to allow more time for LV recovery.  Have d/w D.r Allyson Sabal who will assess in the AM to decide +/- PCI of Cx vs. Simply re-look cath to ensure stability of LAD stent.  Marykay Lex, MD 10/21/2012 5:57 PM

## 2012-10-22 ENCOUNTER — Encounter (HOSPITAL_COMMUNITY)
Admission: RE | Disposition: A | Discharge status: Home / Self Care | Payer: Self-pay | Attending: Cardiovascular Disease

## 2012-10-22 DIAGNOSIS — I2589 Other forms of chronic ischemic heart disease: Secondary | ICD-10-CM

## 2012-10-22 DIAGNOSIS — I255 Ischemic cardiomyopathy: Secondary | ICD-10-CM | POA: Diagnosis present

## 2012-10-22 LAB — HEPARIN LEVEL (UNFRACTIONATED): Heparin Unfractionated: 0.41 IU/mL (ref 0.30–0.70)

## 2012-10-22 LAB — TROPONIN I
Troponin I: 11.76 ng/mL (ref <0.30)
Troponin I: 9.61 ng/mL (ref <0.30)

## 2012-10-22 LAB — CBC
HCT: 34.7 % — ABNORMAL LOW (ref 36.0–46.0)
Hemoglobin: 11.4 g/dL — ABNORMAL LOW (ref 12.0–15.0)
RBC: 3.85 MIL/uL — ABNORMAL LOW (ref 3.87–5.11)
RDW: 13.4 % (ref 11.5–15.5)
WBC: 8.4 10*3/uL (ref 4.0–10.5)

## 2012-10-22 LAB — PROTIME-INR: INR: 1.12 (ref 0.00–1.49)

## 2012-10-22 SURGERY — LEFT HEART CATHETERIZATION WITH CORONARY ANGIOGRAM
Anesthesia: LOCAL

## 2012-10-22 MED ORDER — FUROSEMIDE 20 MG PO TABS
20.0000 mg | ORAL_TABLET | Freq: Every day | ORAL | Status: DC
  Administered 2012-10-22 – 2012-10-25 (×3): 20 mg via ORAL
  Filled 2012-10-22 (×4): qty 1

## 2012-10-22 MED ORDER — ISOSORBIDE MONONITRATE ER 30 MG PO TB24
30.0000 mg | ORAL_TABLET | Freq: Every day | ORAL | Status: DC
  Administered 2012-10-22 – 2012-10-25 (×4): 30 mg via ORAL
  Filled 2012-10-22 (×4): qty 1

## 2012-10-22 NOTE — Progress Notes (Signed)
CARDIAC REHAB PHASE I   PRE:  Rate/Rhythm: 59 SB in bed,  bigeminy PACs going to BR    BP: sitting 88/53    SaO2:   MODE:  Ambulation: 290 ft   POST:  Rate/Rhythm: 79 SR    BP: sitting 134/58     SaO2:    To BR before walk, had bigeminy PACs upon getting up. Resolved with rest and not noted anymore. Fairly steady walking, denied angina however did have increased SOB. Resolved with rest in recliner after walk. Pt talkative during and after walk. BP low before walk but increased with activity. Began ed. Encouraged decrease sodium and rest from aerobics. Will continue tomorrow. [phone]  Elissa Lovett Klahr CES, ACSM 10/22/2012 11:23 AM

## 2012-10-22 NOTE — Progress Notes (Signed)
DAILY PROGRESS NOTE  Subjective:  No further chest pain. Dr. Herbie Baltimore reviewed the cath films and it is felt that the LCX is small and not likely to be a good PCI vessel with minimal benefit.  Objective:  Temp:  [98.2 F (36.8 C)-98.6 F (37 C)] 98.6 F (37 C) (07/29 0400) Resp:  [14-26] 19 (07/29 0600) BP: (103-144)/(35-72) 116/49 mmHg (07/29 0600) SpO2:  [96 %-100 %] 100 % (07/29 0400) Weight change:   Intake/Output from previous day: 07/28 0701 - 07/29 0700 In: 1308.3 [P.O.:880; I.V.:428.3] Out: 400 [Urine:400]  Intake/Output from this shift:    Medications: Current Facility-Administered Medications  Medication Dose Route Frequency Provider Last Rate Last Dose  . 0.9 %  sodium chloride infusion   Intravenous Continuous Lennette Bihari, MD      . 0.9 %  sodium chloride infusion   Intravenous PRN Lennette Bihari, MD 20 mL/hr at 10/21/12 2000    . acetaminophen (TYLENOL) tablet 650 mg  650 mg Oral Q4H PRN Lennette Bihari, MD   650 mg at 10/20/12 2232  . aspirin EC tablet 81 mg  81 mg Oral Daily Lennette Bihari, MD   81 mg at 10/21/12 1103  . atorvastatin (LIPITOR) tablet 80 mg  80 mg Oral q1800 Lennette Bihari, MD   80 mg at 10/21/12 1853  . heparin ADULT infusion 100 units/mL (25000 units/250 mL)  1,300 Units/hr Intravenous Continuous Lennette Bihari, MD 13 mL/hr at 10/22/12 0643 1,300 Units/hr at 10/22/12 0643  . lisinopril (PRINIVIL,ZESTRIL) tablet 2.5 mg  2.5 mg Oral Daily Lennette Bihari, MD   2.5 mg at 10/21/12 1104  . magnesium hydroxide (MILK OF MAGNESIA) suspension 30 mL  30 mL Oral Daily PRN Lennette Bihari, MD   30 mL at 10/21/12 2148  . metoprolol tartrate (LOPRESSOR) tablet 25 mg  25 mg Oral BID Lennette Bihari, MD   25 mg at 10/21/12 2148  . morphine 2 MG/ML injection 1 mg  1 mg Intravenous Q2H PRN Chrystie Nose, MD      . nitroGLYCERIN (NITROSTAT) SL tablet 0.4 mg  0.4 mg Sublingual Q5 Min x 3 PRN Lennette Bihari, MD   0.4 mg at 10/21/12 1510  . nitroGLYCERIN 0.2 mg/mL  in dextrose 5 % infusion  2-200 mcg/min Intravenous Continuous Lennette Bihari, MD   10 mcg/min at 10/20/12 0200  . nitroGLYCERIN 0.2 mg/mL in dextrose 5 % infusion  2-200 mcg/min Intravenous Titrated Marykay Lex, MD 3 mL/hr at 10/21/12 1618 10 mcg/min at 10/21/12 1618  . ondansetron (ZOFRAN) injection 4 mg  4 mg Intravenous Q6H PRN Lennette Bihari, MD      . Ticagrelor [facility]) tablet 90 mg  90 mg Oral BID Lennette Bihari, MD   90 mg at 10/21/12 2148    Physical Exam: General appearance: alert and no distress Neck: no adenopathy, no carotid bruit, no JVD, supple, symmetrical, trachea midline and thyroid not enlarged, symmetric, no tenderness/mass/nodules Lungs: clear to auscultation bilaterally Heart: regular rate and rhythm, S1, S2 normal, no murmur, click, rub or gallop Abdomen: soft, non-tender; bowel sounds normal; no masses,  no organomegaly Extremities: extremities normal, atraumatic, no cyanosis or edema Pulses: 2+ and symmetric Skin: Skin color, texture, turgor normal. No rashes or lesions Neurologic: Grossly normal  Lab Results: Results for orders placed during the hospital encounter of 10/19/12 (from the past 48 hour(s))  TROPONIN I     Status: Abnormal   Collection Time  10/20/12  8:14 AM      Result Value Range   Troponin I >20.00 (*) <0.30 ng/mL   Comment:            Due to the release kinetics of cTnI,     a negative result within the first hours     of the onset of symptoms does not rule out     myocardial infarction with certainty.     If myocardial infarction is still suspected,     repeat the test at appropriate intervals.     CRITICAL VALUE NOTED.  VALUE IS CONSISTENT WITH PREVIOUSLY REPORTED AND CALLED VALUE.  TROPONIN I     Status: Abnormal   Collection Time    10/20/12  2:21 PM      Result Value Range   Troponin I >20.00 (*) <0.30 ng/mL   Comment: CRITICAL VALUE NOTED.  VALUE IS CONSISTENT WITH PREVIOUSLY REPORTED AND CALLED VALUE.  TROPONIN I      Status: Abnormal   Collection Time    10/20/12  8:02 PM      Result Value Range   Troponin I >20.00 (*) <0.30 ng/mL   Comment:            Due to the release kinetics of cTnI,     a negative result within the first hours     of the onset of symptoms does not rule out     myocardial infarction with certainty.     If myocardial infarction is still suspected,     repeat the test at appropriate intervals.     CRITICAL VALUE NOTED.  VALUE IS CONSISTENT WITH PREVIOUSLY REPORTED AND CALLED VALUE.  TROPONIN I     Status: Abnormal   Collection Time    10/21/12 10:58 AM      Result Value Range   Troponin I >20.00 (*) <0.30 ng/mL   Comment:            Due to the release kinetics of cTnI,     a negative result within the first hours     of the onset of symptoms does not rule out     myocardial infarction with certainty.     If myocardial infarction is still suspected,     repeat the test at appropriate intervals.     CRITICAL VALUE NOTED.  VALUE IS CONSISTENT WITH PREVIOUSLY REPORTED AND CALLED VALUE.  BASIC METABOLIC PANEL     Status: Abnormal   Collection Time    10/21/12 10:58 AM      Result Value Range   Sodium 139  135 - 145 mEq/L   Potassium 3.8  3.5 - 5.1 mEq/L   Chloride 103  96 - 112 mEq/L   CO2 29  19 - 32 mEq/L   Glucose, Bld 125 (*) 70 - 99 mg/dL   BUN 17  6 - 23 mg/dL   Creatinine, Ser 8.46  0.50 - 1.10 mg/dL   Calcium 96.2  8.4 - 95.2 mg/dL   GFR calc non Af Amer 61 (*) >90 mL/min   GFR calc Af Amer 71 (*) >90 mL/min   Comment:            The eGFR has been calculated     using the CKD EPI equation.     This calculation has not been     validated in all clinical     situations.     eGFR's persistently     <90 mL/min signify  possible Chronic Kidney Disease.  HEPARIN LEVEL (UNFRACTIONATED)     Status: Abnormal   Collection Time    10/21/12 11:07 PM      Result Value Range   Heparin Unfractionated 0.27 (*) 0.30 - 0.70 IU/mL   Comment:            IF HEPARIN  RESULTS ARE BELOW     EXPECTED VALUES, AND PATIENT     DOSAGE HAS BEEN CONFIRMED,     SUGGEST FOLLOW UP TESTING     OF ANTITHROMBIN III LEVELS.  HEPARIN LEVEL (UNFRACTIONATED)     Status: None   Collection Time    10/22/12  5:23 AM      Result Value Range   Heparin Unfractionated 0.41  0.30 - 0.70 IU/mL   Comment:            IF HEPARIN RESULTS ARE BELOW     EXPECTED VALUES, AND PATIENT     DOSAGE HAS BEEN CONFIRMED,     SUGGEST FOLLOW UP TESTING     OF ANTITHROMBIN III LEVELS.  CBC     Status: Abnormal   Collection Time    10/22/12  5:23 AM      Result Value Range   WBC 8.4  4.0 - 10.5 K/uL   RBC 3.85 (*) 3.87 - 5.11 MIL/uL   Hemoglobin 11.4 (*) 12.0 - 15.0 g/dL   HCT 40.9 (*) 81.1 - 91.4 %   MCV 90.1  78.0 - 100.0 fL   MCH 29.6  26.0 - 34.0 pg   MCHC 32.9  30.0 - 36.0 g/dL   RDW 78.2  95.6 - 21.3 %   Platelets 194  150 - 400 K/uL  PROTIME-INR     Status: None   Collection Time    10/22/12  5:23 AM      Result Value Range   Prothrombin Time 14.2  11.6 - 15.2 seconds   INR 1.12  0.00 - 1.49    Imaging: No results found.  Assessment:  1. Principal Problem: 2.   ST elevation (STEMI) myocardial infarction involving left anterior descending coronary artery 3. Active Problems: 4.   HTN (hypertension) 5.   Post-infarction angina 6.   Plan:  1. No further upper back pain. Try to wean nitroglycerin and stop heparin today. EKG now more clearly shows large evolving anterior mi with deep TWI's. Ok to ambulate today with cardiac rehab around the unit. Can transfer to 2900 stepdown. Will start low dose lasix 20 mg daily today - she is net positive. Continue to trend troponin as it is >20. Re-check BNP in the am.  Time Spent Directly with Patient:  15 minutes  Length of Stay:  LOS: 3 days   Chrystie Nose, MD, [facility] Attending Cardiologist [facility]  HILTY,Kenneth C 10/22/2012, 7:45 AM

## 2012-10-23 ENCOUNTER — Encounter (HOSPITAL_COMMUNITY)
Admission: RE | Disposition: A | Discharge status: Home / Self Care | Payer: Self-pay | Attending: Cardiovascular Disease

## 2012-10-23 DIAGNOSIS — I959 Hypotension, unspecified: Secondary | ICD-10-CM

## 2012-10-23 HISTORY — PX: PERCUTANEOUS CORONARY STENT INTERVENTION (PCI-S): SHX5485

## 2012-10-23 LAB — BASIC METABOLIC PANEL
BUN: 19 mg/dL (ref 6–23)
Creatinine, Ser: 0.99 mg/dL (ref 0.50–1.10)
GFR calc Af Amer: 63 mL/min — ABNORMAL LOW (ref >90)
GFR calc non Af Amer: 54 mL/min — ABNORMAL LOW (ref >90)
Potassium: 4 mEq/L (ref 3.5–5.1)

## 2012-10-23 LAB — CBC
HCT: 35.6 % — ABNORMAL LOW (ref 36.0–46.0)
MCHC: 34 g/dL (ref 30.0–36.0)
RDW: 13.4 % (ref 11.5–15.5)

## 2012-10-23 LAB — HEPARIN LEVEL (UNFRACTIONATED): Heparin Unfractionated: <0.1 IU/mL — ABNORMAL LOW (ref 0.30–0.70)

## 2012-10-23 LAB — POCT ACTIVATED CLOTTING TIME: Activated Clotting Time: 411 seconds

## 2012-10-23 SURGERY — PERCUTANEOUS CORONARY STENT INTERVENTION (PCI-S)
Anesthesia: LOCAL

## 2012-10-23 MED ORDER — SODIUM CHLORIDE 0.9 % IV SOLN: 250.0000 mL | INTRAVENOUS | Status: DC | PRN

## 2012-10-23 MED ORDER — SODIUM CHLORIDE 0.9 % IJ SOLN: 3.0000 mL | INTRAMUSCULAR | Status: DC | PRN

## 2012-10-23 MED ORDER — SODIUM CHLORIDE 0.9 % IV SOLN: INTRAVENOUS | Status: DC

## 2012-10-23 MED ORDER — SODIUM CHLORIDE 0.9 % IV SOLN: INTRAVENOUS | Status: AC

## 2012-10-23 MED ORDER — ACETAMINOPHEN 325 MG PO TABS
650.0000 mg | ORAL_TABLET | ORAL | Status: DC | PRN
  Administered 2012-10-23: 650 mg via ORAL
  Filled 2012-10-23: qty 2

## 2012-10-23 MED ORDER — LIDOCAINE HCL (PF) 1 % IJ SOLN
INTRAMUSCULAR | Status: AC
  Filled 2012-10-23: qty 30

## 2012-10-23 MED ORDER — ASPIRIN EC 81 MG PO TBEC
81.0000 mg | DELAYED_RELEASE_TABLET | Freq: Every day | ORAL | Status: DC
  Administered 2012-10-24 – 2012-10-25 (×2): 81 mg via ORAL
  Filled 2012-10-23 (×2): qty 1

## 2012-10-23 MED ORDER — ONDANSETRON HCL 4 MG/2ML IJ SOLN: 4.0000 mg | Freq: Four times a day (QID) | INTRAMUSCULAR | Status: DC | PRN

## 2012-10-23 MED ORDER — SODIUM CHLORIDE 0.9 % IV SOLN
0.2500 mg/kg/h | INTRAVENOUS | Status: DC
  Filled 2012-10-23: qty 250

## 2012-10-23 MED ORDER — FENTANYL CITRATE 0.05 MG/ML IJ SOLN
INTRAMUSCULAR | Status: AC
  Filled 2012-10-23: qty 2

## 2012-10-23 MED ORDER — BIVALIRUDIN 250 MG IV SOLR
INTRAVENOUS | Status: AC
  Filled 2012-10-23: qty 250

## 2012-10-23 MED ORDER — MIDAZOLAM HCL 2 MG/2ML IJ SOLN
INTRAMUSCULAR | Status: AC
  Filled 2012-10-23: qty 2

## 2012-10-23 MED ORDER — SODIUM CHLORIDE 0.9 % IV SOLN
INTRAVENOUS | Status: AC
  Administered 2012-10-23: 23:00:00 via INTRAVENOUS

## 2012-10-23 MED ORDER — TICAGRELOR 90 MG PO TABS
90.0000 mg | ORAL_TABLET | Freq: Two times a day (BID) | ORAL | Status: DC
  Administered 2012-10-23 – 2012-10-25 (×4): 90 mg via ORAL
  Filled 2012-10-23 (×5): qty 1

## 2012-10-23 MED ORDER — NITROGLYCERIN 0.2 MG/ML ON CALL CATH LAB
INTRAVENOUS | Status: AC
  Filled 2012-10-23: qty 1

## 2012-10-23 MED ORDER — ATROPINE SULFATE 1 MG/ML IJ SOLN
INTRAMUSCULAR | Status: AC
  Filled 2012-10-23: qty 1

## 2012-10-23 MED ORDER — HEPARIN (PORCINE) IN NACL 2-0.9 UNIT/ML-% IJ SOLN
INTRAMUSCULAR | Status: AC
  Filled 2012-10-23: qty 1000

## 2012-10-23 MED ORDER — SODIUM CHLORIDE 0.9 % IJ SOLN: 3.0000 mL | Freq: Two times a day (BID) | INTRAMUSCULAR | Status: DC

## 2012-10-23 NOTE — Progress Notes (Signed)
I was called by the RN, Tim, because the patient's HR decreased to the low 30's.  She reports feeling like she needed to take some deep breaths but did not have any dizziness.  She got up from the chair to the bed without difficulties.    Telemetry Shows sinus brady.  PACs and several pauses ~2-2.5 sec and one after a PAC which was ~3.5 sec.  She also had a short run of SVT.  Unfortunately she already received her beta blocker this AM.  I will DC further doses.  Pacer pads have been placed just incase.  Out of bed with assistance only now.      Rosy Estabrook 11:09 AM

## 2012-10-23 NOTE — Progress Notes (Signed)
Report from Night RN. Chart reviewed together. Handoff complete.Introductions complete. Will continue to monitor and advise attending as needed.   

## 2012-10-23 NOTE — Progress Notes (Signed)
Noted events. Will hold ambulation today. Will f/u tomorrow. Ethelda Chick CES, ACSM 10:42 AM 10/23/2012

## 2012-10-23 NOTE — Progress Notes (Signed)
Subjective: No further Back/ neck pain.  Objective: Vital signs in last 24 hours: Temp:  [98.2 F (36.8 C)-99.5 F (37.5 C)] 98.7 F (37.1 C) (07/30 0800) Pulse Rate:  [65-68] 68 (07/30 0800) Resp:  [16-20] 20 (07/30 0800) BP: (83-134)/(38-63) 83/61 mmHg (07/30 0800) SpO2:  [90 %-98 %] 97 % (07/30 0800) Last BM Date: 10/19/12  Intake/Output from previous day: 07/29 0701 - 07/30 0700 In: 623.9 [P.O.:520; I.V.:103.9] Out: -  Intake/Output this shift:    Medications Current Facility-Administered Medications  Medication Dose Route Frequency Provider Last Rate Last Dose  . 0.9 %  sodium chloride infusion   Intravenous Continuous Lennette Bihari, MD      . 0.9 %  sodium chloride infusion   Intravenous PRN Lennette Bihari, MD 20 mL/hr at 10/21/12 2000    . acetaminophen (TYLENOL) tablet 650 mg  650 mg Oral Q4H PRN Lennette Bihari, MD   650 mg at 10/20/12 2232  . aspirin EC tablet 81 mg  81 mg Oral Daily Lennette Bihari, MD   81 mg at 10/22/12 1015  . atorvastatin (LIPITOR) tablet 80 mg  80 mg Oral q1800 Lennette Bihari, MD   80 mg at 10/22/12 1740  . furosemide (LASIX) tablet 20 mg  20 mg Oral Daily Chrystie Nose, MD   20 mg at 10/22/12 1016  . isosorbide mononitrate (IMDUR) 24 hr tablet 30 mg  30 mg Oral Daily Chrystie Nose, MD   30 mg at 10/22/12 1015  . lisinopril (PRINIVIL,ZESTRIL) tablet 2.5 mg  2.5 mg Oral Daily Lennette Bihari, MD   2.5 mg at 10/22/12 1015  . magnesium hydroxide (MILK OF MAGNESIA) suspension 30 mL  30 mL Oral Daily PRN Lennette Bihari, MD   30 mL at 10/21/12 2148  . metoprolol tartrate (LOPRESSOR) tablet 25 mg  25 mg Oral BID Lennette Bihari, MD   25 mg at 10/22/12 2108  . morphine 2 MG/ML injection 1 mg  1 mg Intravenous Q2H PRN Chrystie Nose, MD      . nitroGLYCERIN (NITROSTAT) SL tablet 0.4 mg  0.4 mg Sublingual Q5 Min x 3 PRN Lennette Bihari, MD   0.4 mg at 10/21/12 1510  . nitroGLYCERIN 0.2 mg/mL in dextrose 5 % infusion  2-200 mcg/min Intravenous  Titrated Marykay Lex, MD 1.5 mL/hr at 10/22/12 0915 5 mcg/min at 10/22/12 0915  . ondansetron (ZOFRAN) injection 4 mg  4 mg Intravenous Q6H PRN Lennette Bihari, MD      . Ticagrelor [facility]) tablet 90 mg  90 mg Oral BID Lennette Bihari, MD   90 mg at 10/22/12 2108    PE: General appearance: alert, cooperative and no distress Lungs: clear to auscultation bilaterally Heart: regular rate and rhythm and 1/6 sys MM Extremities: 1+ ankle edema. Pulses: 2+ and symmetric Skin: warm and dry Neurologic: Grossly normal  Lab Results:   Recent Labs  10/22/12 0523 10/23/12 0420  WBC 8.4 9.7  HGB 11.4* 12.1  HCT 34.7* 35.6*  PLT 194 227   BMET  Recent Labs  10/21/12 1058 10/23/12 0420  NA 139 138  K 3.8 4.0  CL 103 102  CO2 29 27  GLUCOSE 125* 102*  BUN 17 19  CREATININE 0.90 0.99  CALCIUM 10.0 9.5   PT/INR  Recent Labs  10/22/12 0523  LABPROT 14.2  INR 1.12    Assessment/Plan  Principal Problem:   ST elevation (STEMI) myocardial infarction  involving left anterior descending coronary artery Active Problems:   HTN (hypertension)   Cardiomyopathy, ischemic: EF 35-40% by echo post-MI  Plan:  Hypotensive this AM.  Troponin went back up yesterday: 9.61 at 1535hrs and 10.17 at 1953hrs.   Rechecking again this AM.  Now on lasix 20mg  daily.  Ambulated around the unit with cardiac rehab with no problems.     LOS: 4 days    HAGER, BRYAN 10/23/2012 8:41 AM   Patient seen and examined. Agree with assessment and plan. I have again reviewed cines from acute PCI to LAD. 95% concomitant AV groove LCX stenosis. With recurrent pain and slight additional increase in troponin discussed attempt at PCI to LCX lesion and reduce ischemic burden of additional myocardium following large anterior MI. Discussed risks/benefits of procedure. Will plan for later today.   Lennette Bihari, MD, [facility] 10/23/2012 12:28 PM

## 2012-10-23 NOTE — Progress Notes (Signed)
10/23/12 1050  Vitals  ECG Heart Rate ! 34  Alerted by monitor of HR. Pt c/o SHOB. Alert and oriented. bp 13./105. Pa made aware. Pacer pads placed on pt per pa. Will continue to monitor and advise attending as needed.

## 2012-10-24 ENCOUNTER — Encounter (HOSPITAL_COMMUNITY): Payer: Self-pay

## 2012-10-24 LAB — BASIC METABOLIC PANEL
BUN: 16 mg/dL (ref 6–23)
Chloride: 107 mEq/L (ref 96–112)
GFR calc Af Amer: 70 mL/min — ABNORMAL LOW (ref >90)
GFR calc non Af Amer: 60 mL/min — ABNORMAL LOW (ref >90)
Potassium: 4.1 mEq/L (ref 3.5–5.1)
Sodium: 140 mEq/L (ref 135–145)

## 2012-10-24 LAB — CBC
HCT: 35 % — ABNORMAL LOW (ref 36.0–46.0)
Platelets: 237 10*3/uL (ref 150–400)
RDW: 13.7 % (ref 11.5–15.5)
WBC: 7.9 10*3/uL (ref 4.0–10.5)

## 2012-10-24 MED ORDER — LISINOPRIL 5 MG PO TABS
5.0000 mg | ORAL_TABLET | Freq: Every day | ORAL | Status: DC
  Administered 2012-10-25: 5 mg via ORAL
  Filled 2012-10-24: qty 1

## 2012-10-24 MED FILL — Sodium Chloride IV Soln 0.9%: INTRAVENOUS | Qty: 50 | Status: AC

## 2012-10-24 NOTE — Progress Notes (Addendum)
Pt's right femoral artery sheath removed at 2252 per MD orders:  (If CrCl>30, remove sheath (no ACT) when off Angiomax for >2 hours and not receiving IV heparin.)  Per Epic documentation and shift change report from previous RN, Angiomax gtt was DC'd at 1711 and pt's CrCl was 57.8 mL/min; pt also not receiving IV heparin. With assistance from [place] Brody, RN, sheath removed with no complications. Manual pressure held for 20 minutes. Pt's vital signs remained stable throughout procedure. Distal pulses 2+ before and after sheath removal. Right groin is level 0 with no bruising, oozing, or hematoma development. Pressure dressing with 4x4 gauze and tape applied. Pt educated and given instructions to hold pressure on right groin during any episodes of coughing, laughing, sneezing, or straining of any kind. Pt also informed about bed-rest and keeping right leg straight for 4 hours. Will continue to monitor.

## 2012-10-24 NOTE — Progress Notes (Signed)
[facility]  Subjective: No further neck pain. Denies chest, groin, back and flank pain.   Objective: Vital signs in last 24 hours: Temp:  [97.7 F (36.5 C)-99 F (37.2 C)] 98.5 F (36.9 C) (07/31 0744) Pulse Rate:  [55-128] 56 (07/31 0744) Resp:  [15-30] 19 (07/31 0600) BP: (91-158)/(25-116) 133/57 mmHg (07/31 0744) SpO2:  [93 %-100 %] 95 % (07/31 0744) Arterial Line BP: (122-163)/(47-73) 150/67 mmHg (07/30 2250) Weight:  [222 lb 7.1 oz (100.9 kg)] 222 lb 7.1 oz (100.9 kg) (07/30 0925) Last BM Date: 10/23/12  Intake/Output from previous day: 07/30 0701 - 07/31 0700 In: 2059.2 [P.O.:480; I.V.:1579.2] Out: 1100 [Urine:1100] Intake/Output this shift:    Medications Current Facility-Administered Medications  Medication Dose Route Frequency Provider Last Rate Last Dose  . 0.9 %  sodium chloride infusion   Intravenous PRN Lennette Bihari, MD 20 mL/hr at 10/21/12 2000    . 0.9 %  sodium chloride infusion   Intravenous Continuous Lennette Bihari, MD 100 mL/hr at 10/24/12 0000    . acetaminophen (TYLENOL) tablet 650 mg  650 mg Oral Q4H PRN Lennette Bihari, MD   650 mg at 10/23/12 2119  . aspirin EC tablet 81 mg  81 mg Oral Daily Lennette Bihari, MD      . atorvastatin (LIPITOR) tablet 80 mg  80 mg Oral q1800 Lennette Bihari, MD   80 mg at 10/23/12 1912  . furosemide (LASIX) tablet 20 mg  20 mg Oral Daily Chrystie Nose, MD   20 mg at 10/22/12 1016  . isosorbide mononitrate (IMDUR) 24 hr tablet 30 mg  30 mg Oral Daily Chrystie Nose, MD   30 mg at 10/23/12 0910  . lisinopril (PRINIVIL,ZESTRIL) tablet 2.5 mg  2.5 mg Oral Daily Lennette Bihari, MD   2.5 mg at 10/23/12 0910  . magnesium hydroxide (MILK OF MAGNESIA) suspension 30 mL  30 mL Oral Daily PRN Lennette Bihari, MD   30 mL at 10/21/12 2148  . morphine 2 MG/ML injection 1 mg  1 mg Intravenous Q2H PRN Chrystie Nose, MD      . nitroGLYCERIN (NITROSTAT) SL tablet 0.4 mg  0.4 mg Sublingual Q5 Min x 3 PRN  Lennette Bihari, MD   0.4 mg at 10/21/12 1510  . ondansetron (ZOFRAN) injection 4 mg  4 mg Intravenous Q6H PRN Lennette Bihari, MD      . Ticagrelor [facility]) tablet 90 mg  90 mg Oral BID Lennette Bihari, MD   90 mg at 10/23/12 2119    PE: General appearance: alert, cooperative and no distress Lungs: clear to auscultation bilaterally Heart: regular rate and rhythm and 1/6 SM Extremities: no LEE Pulses: 2+ and symmetric Skin: warm and dry Neurologic: Grossly normal  Lab Results:   Recent Labs  10/22/12 0523 10/23/12 0420 10/24/12 0428  WBC 8.4 9.7 7.9  HGB 11.4* 12.1 11.8*  HCT 34.7* 35.6* 35.0*  PLT 194 227 237   BMET  Recent Labs  10/21/12 1058 10/23/12 0420 10/24/12 0428  NA 139 138 140  K 3.8 4.0 4.1  CL 103 102 107  CO2 29 27 25   GLUCOSE 125* 102* 99  BUN 17 19 16   CREATININE 0.90 0.99 0.91  CALCIUM 10.0 9.5 9.2   PT/INR  Recent Labs  10/22/12 0523  LABPROT 14.2  INR 1.12   Cardiac Panel (last 3 results)  Recent Labs  10/22/12 1535 10/22/12 1953 10/23/12 1610  TROPONINI 9.61* 10.17* 2.71*    Studies/Results: Re-look cath with attempted PCI 10/23/12  Cath report pending  Assessment/Plan  Principal Problem:   ST elevation (STEMI) myocardial infarction involving left anterior descending coronary artery Active Problems:   HTN (hypertension)   Cardiomyopathy, ischemic: EF 35-40% by echo post-MI  Plan: S/p re-look cath with PCI to the LCX yesterday. No further angina. Troponin's down trending. BP is stable. She is still bradycardic, but HR has improved to the upper 50s. She is asymptomatic. BB still on hold. Continue on AA, Brilinta, ACE-I and Imdur. Will get cardiac rehab to ambulate today. If HR remains stable may be able to transfer to telemetry today.     LOS: 5 days    Brittainy M. Delmer Islam 10/24/2012 8:26 AM  I have seen and examined the patient along with Brittainy M. Sharol Harness, PA-C.  I have reviewed the chart, notes and new data.   I agree with PA's note.  Key new complaints: no angina, no dyspnea at rest Key examination changes: No JVD, S3 or rales, no edema Key new findings / data: normal renal function and Hgb  PLAN: Telemetry today, probably home tomorrow if she ambulates without angina (her equivalent is shoulder pain) or dyspnea. Increase lisinopril. If heart rate allows would reintroduce a very low dose of carvedilol rather than metoprolol. Transfer telemetry. Reevaluate EF in several weeks.  Thurmon Fair, MD, [facility] [phone] 10/24/2012, 9:32 AM

## 2012-10-24 NOTE — Progress Notes (Signed)
CARDIAC REHAB PHASE I   PRE:  Rate/Rhythm: 57 SB    BP: sitting 120/60    SaO2:   MODE:  Ambulation: 350 ft   POST:  Rate/Rhythm: 96 SR    BP: sitting 160/70     SaO2: 97 RA   Pt SOB with walking. Could not complete sentences by end of walk. Pt makes light of this. HR increased to 96 SR, BP up too. Will f/u, encouraged more walking today. [id number] Elissa Lovett [place] CES, ACSM 10/24/2012 12:25 PM

## 2012-10-24 NOTE — CV Procedure (Addendum)
Susan [name] is a 78 y.o. female    [id number]  [id number] LOCATION:  FACILITY: [facility]  PHYSICIAN: Lennette Bihari, MD, [facility] 01-16-1935   DATE OF PROCEDURE:  10/23/2012    [facility]  Percutaneous Intervention Note     HISTORY: Susan [name] is a 43 -year-old white female who presented to [facility] on 10/19/2012 in the setting of an anterior wall ST segment elevation myocardial infarction. At that time, she had pain of greater than 12 hours duration. She had been transferred from [facility] emergency room to [facility] catheterization laboratory for acute intervention. At that time, her proximal LAD was totally occluded and she underwent stenting of her LAD from the proximal to mid region extending back up to the ostium with insertion of 3.0x38 and 3.0x16 mm probe was premier stents. He did have significant wall motion abnormality involving her LAD territory. She also had concomitant CAD with 95% mid AV groove left circumflex stenosis. She did develop significant evolutionary changes on her ECG she has experienced recurrent episodes of back discomfort. She also had a slight increase in her troponin levels appear she also has had recent development of bradycardia. In light of her concomitant high-grade left circumflex disease and with her additional symptomatology post successful PCI to LAD, she is now brought back to the catheterization laboratory for PCI of the left circumflex vessel.  PROCEDURE:  The patient was brought to the second floor [place] [facility] lab in the postabsorptive state. She was premedicated with Versed 2 mg and fentanyl 50 mcg. A right femoral artery was punctured anteriorly and a 6 French sheath was inserted. A 6 French XB 3.5 guide was used. At angiography confirmed excellent patency to her entire LAD system and there now appeared to be improved wall motion abnormality particularly in the distal anterolateral and apical segments.  With the demonstration [place] 90-95% AV groove circumflex stenosis or ventricle was performed. She received Angiomax bolus plus infusion. She has been on Brilinta 90 mg twice a day. Documentation of therapeutic anticoagulation, and twisted wire was advanced into the circumflex territory and advanced to the distal vessel. Predilatation was done with a 2.0x12 mm balloon. A 2.25x12 mm probe was premier stent successfully deployed with careful attention not to jail the small marginal vessels. This was dilated x2. A 2.5x8 mm noncompliant track was used for post dilatation. Scattered angiography confirmed excellent angiographic result. There was no evidence for dissection. The patient was pain-free and had stable hemodynamics. The arterial sheath was sutured in place with plans for sheath removal later today.   HEMODYNAMICS:   Central Aorta: 133/73     ANGIOGRAPHY:  Relookof the left system revealed a normal left main which bifurcated into an LAD and left circumflex vessel. The stents in the LAD were widely patent and extended from the proximal midsegment up to the ostium. There was 0 restenosis. There was brisk TIMI-3 flow. The LAD extended to and wrapped around the LV apex. There now appear to be improved contractility in the distal anterolateral wall and apex compared to the acute study in which there was severe hypo-to akinesis with suggestion of focal apical dyskinesis.  Left circumflex is a small-caliber vessel that gave rise to a prominent first marginal vessel. There was a 95% stenosis in the mid AV groove circumflex  Between 2  smaller marginal branches. Following PTCA and ultimate stenting with a 2.25x12 mm Promus  Premier stent post dilated with a 2.25 noncompliant balloon this  area was reduced to 0%. There was previously noted mild 20% narrowing just proximal to  small marginal vessel proximal to the stented segment. There was no evidence for dissection    IMPRESSION: Widely pain left  anterior descending artery stents with 0 restenosis and improve distal LAD contractility in this patient status post acute ST segment elevation myocardial infarction anteriorly on 10/19/2012 with delayed presentation of greater than 12 hours of chest pain onset. Successful PTCA/stenting of mid left circumflex vessel with a 95% stenosis being reduced to 0% with  insertion of 2.25x12 mm Promus Premier DES stent.  Bivalirudin/IC nitroglycerin/patient on brilinta/aspirin for dual antiplatelet therapy   Lennette Bihari, MD, [facility] 10/24/2012 8:13 AM

## 2012-10-24 NOTE — Plan of Care (Signed)
D/c to 3w 17. Vss. No c/o pain. Report to rn

## 2012-10-25 DIAGNOSIS — I472 Ventricular tachycardia: Secondary | ICD-10-CM | POA: Diagnosis not present

## 2012-10-25 DIAGNOSIS — I2510 Atherosclerotic heart disease of native coronary artery without angina pectoris: Secondary | ICD-10-CM | POA: Diagnosis not present

## 2012-10-25 DIAGNOSIS — I495 Sick sinus syndrome: Secondary | ICD-10-CM | POA: Diagnosis not present

## 2012-10-25 DIAGNOSIS — E785 Hyperlipidemia, unspecified: Secondary | ICD-10-CM | POA: Diagnosis present

## 2012-10-25 MED ORDER — ATORVASTATIN CALCIUM 80 MG PO TABS: 80.0000 mg | ORAL_TABLET | Freq: Every day | ORAL | Status: DC

## 2012-10-25 MED ORDER — TICAGRELOR 90 MG PO TABS: 90.0000 mg | ORAL_TABLET | Freq: Two times a day (BID) | ORAL | Status: DC

## 2012-10-25 MED ORDER — CARVEDILOL 3.125 MG PO TABS: 3.1250 mg | ORAL_TABLET | Freq: Two times a day (BID) | ORAL | Status: DC

## 2012-10-25 MED ORDER — FUROSEMIDE 20 MG PO TABS: 20.0000 mg | ORAL_TABLET | Freq: Every day | ORAL | Status: DC

## 2012-10-25 MED ORDER — CARVEDILOL 3.125 MG PO TABS
3.1250 mg | ORAL_TABLET | Freq: Two times a day (BID) | ORAL | Status: DC
  Administered 2012-10-25: 3.125 mg via ORAL
  Filled 2012-10-25 (×3): qty 1

## 2012-10-25 MED ORDER — LISINOPRIL 5 MG PO TABS: 5.0000 mg | ORAL_TABLET | Freq: Every day | ORAL | Status: DC

## 2012-10-25 MED ORDER — NITROGLYCERIN 0.4 MG SL SUBL: 0.4000 mg | SUBLINGUAL_TABLET | SUBLINGUAL | Status: DC | PRN

## 2012-10-25 MED ORDER — ISOSORBIDE MONONITRATE ER 30 MG PO TB24: 30.0000 mg | ORAL_TABLET | Freq: Every day | ORAL | Status: DC

## 2012-10-25 NOTE — Progress Notes (Signed)
Patient had 11 beats VTACH on telemetry followed by 8 beat run of irregular, wide QRS tachycardia. NSR refractory to these two, discrete events. Patient was sleeping at the time; arouses easily with no report of chest pain, SOB or other problems. Recent STEMI with new drug-eluting stents to LAD and circumflex arteries; currently on brillinta 90mg  PO BID also.  Spoke with Dr. Terressa Koyanagi, on-call for Dr. Tresa Endo to make aware. No new orders at this time.  Continuing to closely monitor.

## 2012-10-25 NOTE — Progress Notes (Signed)
CARDIAC REHAB PHASE I   PRE:  Rate/Rhythm: 59 SB    BP: sitting 90/60    SaO2: 98 RA  MODE:  Ambulation: 550 ft   POST:  Rate/Rhythm: 76 SR    BP: sitting 132/66     SaO2:   Tolerated fairly well. Less SOB than yesterday. Increased distance. Ed completed. Discussed HF as well.  [id number]   Elissa Lovett [place] CES, ACSM 10/25/2012 10:09 AM

## 2012-10-25 NOTE — Progress Notes (Signed)
[facility]  DAILY PROGRESS NOTE   Subjective:  No complaints. Walking without angina/dyspnea. Asymptomatic nonsustained VT last night, total of 17 beats  - 2 back to back episodes. No further bradycardia  Objective:  Temp:  [97.4 F (36.3 C)-98.3 F (36.8 C)] 98.3 F (36.8 C) (08/01 0557) Pulse Rate:  [59-65] 59 (08/01 0557) Resp:  [18-21] 18 (08/01 0557) BP: (109-136)/(57-60) 119/58 mmHg (08/01 0557) SpO2:  [94 %-100 %] 94 % (08/01 0557) Weight change:   Intake/Output from previous day: 07/31 0701 - 08/01 0700 In: 340 [P.O.:240; I.V.:100] Out: -   Intake/Output from this shift:    Medications: Current Facility-Administered Medications  Medication Dose Route Frequency Provider Last Rate Last Dose  . 0.9 %  sodium chloride infusion   Intravenous PRN Lennette Bihari, MD 20 mL/hr at 10/21/12 2000    . acetaminophen (TYLENOL) tablet 650 mg  650 mg Oral Q4H PRN Lennette Bihari, MD   650 mg at 10/23/12 2119  . aspirin EC tablet 81 mg  81 mg Oral Daily Lennette Bihari, MD   81 mg at 10/24/12 1610  . atorvastatin (LIPITOR) tablet 80 mg  80 mg Oral q1800 Lennette Bihari, MD   80 mg at 10/24/12 1732  . carvedilol (COREG) tablet 3.125 mg  3.125 mg Oral BID WC Tiffany Calmes, MD      . furosemide (LASIX) tablet 20 mg  20 mg Oral Daily Chrystie Nose, MD   20 mg at 10/24/12 9604  . isosorbide mononitrate (IMDUR) 24 hr tablet 30 mg  30 mg Oral Daily Chrystie Nose, MD   30 mg at 10/24/12 5409  . lisinopril (PRINIVIL,ZESTRIL) tablet 5 mg  5 mg Oral Daily Laurielle Selmon, MD      . magnesium hydroxide (MILK OF MAGNESIA) suspension 30 mL  30 mL Oral Daily PRN Lennette Bihari, MD   30 mL at 10/21/12 2148  . morphine 2 MG/ML injection 1 mg  1 mg Intravenous Q2H PRN Chrystie Nose, MD      . nitroGLYCERIN (NITROSTAT) SL tablet 0.4 mg  0.4 mg Sublingual Q5 Min x 3 PRN Lennette Bihari, MD   0.4 mg at 10/21/12 1510  . ondansetron (ZOFRAN) injection 4 mg  4 mg  Intravenous Q6H PRN Lennette Bihari, MD      . Ticagrelor [facility]) tablet 90 mg  90 mg Oral BID Lennette Bihari, MD   90 mg at 10/24/12 2142    Physical Exam: General appearance: alert, cooperative and no distress Neck: no adenopathy, no carotid bruit, no JVD, supple, symmetrical, trachea midline and thyroid not enlarged, symmetric, no tenderness/mass/nodules Lungs: clear to auscultation bilaterally Heart: regular rate and rhythm, S1, S2 normal, no murmur, click, rub or gallop Abdomen: soft, non-tender; bowel sounds normal; no masses,  no organomegaly Extremities: extremities normal, atraumatic, no cyanosis or edema Pulses: 2+ and symmetric Skin: Skin color, texture, turgor normal. No rashes or lesions Neurologic: Grossly normal  Lab Results: Results for orders placed during the hospital encounter of 10/19/12 (from the past 48 hour(s))  TROPONIN I     Status: Abnormal   Collection Time    10/23/12  9:25 AM      Result Value Range   Troponin I 2.71 (*) <0.30 ng/mL   Comment:            Due to the release kinetics of cTnI,     a negative result within the first  hours     of the onset of symptoms does not rule out     myocardial infarction with certainty.     If myocardial infarction is still suspected,     repeat the test at appropriate intervals.     CRITICAL VALUE NOTED.  VALUE IS CONSISTENT WITH PREVIOUSLY REPORTED AND CALLED VALUE.  POCT ACTIVATED CLOTTING TIME     Status: None   Collection Time    10/23/12  2:41 PM      Result Value Range   Activated Clotting Time 411    CBC     Status: Abnormal   Collection Time    10/24/12  4:28 AM      Result Value Range   WBC 7.9  4.0 - 10.5 K/uL   RBC 3.88  3.87 - 5.11 MIL/uL   Hemoglobin 11.8 (*) 12.0 - 15.0 g/dL   HCT 16.1 (*) 09.6 - 04.5 %   MCV 90.2  78.0 - 100.0 fL   MCH 30.4  26.0 - 34.0 pg   MCHC 33.7  30.0 - 36.0 g/dL   RDW 40.9  81.1 - 91.4 %   Platelets 237  150 - 400 K/uL  BASIC METABOLIC PANEL     Status: Abnormal    Collection Time    10/24/12  4:28 AM      Result Value Range   Sodium 140  135 - 145 mEq/L   Potassium 4.1  3.5 - 5.1 mEq/L   Chloride 107  96 - 112 mEq/L   CO2 25  19 - 32 mEq/L   Glucose, Bld 99  70 - 99 mg/dL   BUN 16  6 - 23 mg/dL   Creatinine, Ser 7.82  0.50 - 1.10 mg/dL   Calcium 9.2  8.4 - 95.6 mg/dL   GFR calc non Af Amer 60 (*) >90 mL/min   GFR calc Af Amer 70 (*) >90 mL/min   Comment:            The eGFR has been calculated     using the CKD EPI equation.     This calculation has not been     validated in all clinical     situations.     eGFR's persistently     <90 mL/min signify     possible Chronic Kidney Disease.    Imaging: No results found.  Assessment:  1. Principal Problem: 2.   ST elevation (STEMI) myocardial infarction involving left anterior descending coronary artery 3. Active Problems: 4.   HTN (hypertension) 5.   Cardiomyopathy, ischemic: EF 35-40% by echo post-MI 6. Nonsustained VT  Plan:  1. Resume beta blocker - carvedilol preferred with low EF/at risk for overt CHF 2. She was on Benicar before - I think she can go back to it at DC, rather than lisinopril, but restart at lower dose 10 mg a day 3. Titrate up ACEiARB and beta blocker as hemodynamic parameters allow 4. Reevaluate EF by echo in several weeks 5. Early office visit -TCM 6. Daily weight monitoring, sodium restriction, low fat low cholesterol diet discussed.  Time Spent Directly with Patient:  45 minutes  Length of Stay:  LOS: 6 days    Susan [name] 10/25/2012, 9:09 AM

## 2012-10-25 NOTE — Discharge Summary (Signed)
Patient ID: Susan [name],  MRN: [id number], DOB/AGE: [birth year] 78 y.o.  Admit date: 10/19/2012 Discharge date: 10/25/2012  Primary Care Provider:  Primary Cardiologist: Dr Tresa Endo  Discharge Diagnoses Principal Problem:   STEMI 10/19/12- LAD DES Active Problems:   Cardiomyopathy, ischemic: EF 35-40% by echo post-MI 10/20/12   CAD (coronary artery disease)- elective CFX DES 10/24/12 for recurrent angina   HTN (hypertension)   Dyslipidemia   NSVT (nonsustained ventricular tachycardia)   Bradycardia- tolerating low dose beta blocker    Procedures: LAD DES 10/19/12                        CFX DES 10/24/12   Hospital Course Susan [name] is a 78 -year-old white female who presented to [facility] on 10/19/2012 in the setting of an anterior wall ST segment elevation myocardial infarction. At that time, she had pain of greater than 12 hours duration. She had been transferred from [facility] emergency room to the catheterization laboratory at [facility] for acute intervention. Her proximal LAD was totally occluded and she underwent stenting of her LAD from the proximal to mid region extending back up to the ostium with insertion of 3.0x38 and 3.0x16 mm probe was premier stents. She did have a significant wall motion abnormality involving her LAD territory. She also had concomitant CAD with 95% mid AV groove left circumflex stenosis. Post PCI she did develop significant evolutionary changes on her ECG. She had recurrent episodes of back discomfort in the 48 hrs after admission.. She also had a slight increase in her troponin levels  she  had developmed bradycardia. Initially the plan was to try and treat the CFX medically as it was a small vessel with disatal disease, but she continued to have symptoms and she was taken back to the cath lab 10/24/12 and underwent intervention and DES placement to her CFX by Dr Tresa Endo. She tolerated this well and her symptoms resolved. She did have some bradycardia and we had  to back off on her beta blocker. The night before she was discharged she had two runs of NSVT of 17 beats each. She was seen by Dr Royann Shivers on the morning of 8/1 and we resumed her beta blocker at a lower dose.    Discharge Vitals:  Blood pressure 120/66, pulse 62, temperature 98.3 F (36.8 C), temperature source Oral, resp. rate 18, height 5\' 5"  (1.651 m), weight 222 lb 7.1 oz (100.9 kg), SpO2 94.00%.    Labs: Results for orders placed during the hospital encounter of 10/19/12 (from the past 48 hour(s))  POCT ACTIVATED CLOTTING TIME     Status: None   Collection Time    10/23/12  2:41 PM      Result Value Range   Activated Clotting Time 411    CBC     Status: Abnormal   Collection Time    10/24/12  4:28 AM      Result Value Range   WBC 7.9  4.0 - 10.5 K/uL   RBC 3.88  3.87 - 5.11 MIL/uL   Hemoglobin 11.8 (*) 12.0 - 15.0 g/dL   HCT 40.9 (*) 81.1 - 91.4 %   MCV 90.2  78.0 - 100.0 fL   MCH 30.4  26.0 - 34.0 pg   MCHC 33.7  30.0 - 36.0 g/dL   RDW 78.2  95.6 - 21.3 %   Platelets 237  150 - 400 K/uL  BASIC METABOLIC PANEL     Status: Abnormal  Collection Time    10/24/12  4:28 AM      Result Value Range   Sodium 140  135 - 145 mEq/L   Potassium 4.1  3.5 - 5.1 mEq/L   Chloride 107  96 - 112 mEq/L   CO2 25  19 - 32 mEq/L   Glucose, Bld 99  70 - 99 mg/dL   BUN 16  6 - 23 mg/dL   Creatinine, Ser 1.61  0.50 - 1.10 mg/dL   Calcium 9.2  8.4 - 09.6 mg/dL   GFR calc non Af Amer 60 (*) >90 mL/min   GFR calc Af Amer 70 (*) >90 mL/min   Comment:            The eGFR has been calculated     using the CKD EPI equation.     This calculation has not been     validated in all clinical     situations.     eGFR's persistently     <90 mL/min signify     possible Chronic Kidney Disease.    Disposition:      Follow-up Information   Follow up with Nicki Guadalajara A, MD. (office will call you)    Contact information:   [address] [place] [address] [phone]        Discharge Medications:    Medication List    STOP taking these medications       olmesartan 40 MG tablet  Commonly known as:  BENICAR     simvastatin 20 MG tablet  Commonly known as:  ZOCOR      TAKE these medications       acetaminophen 650 MG CR tablet  Commonly known as:  TYLENOL  Take 650 mg by mouth daily.     aspirin EC 81 MG tablet  Take 81 mg by mouth daily.     atorvastatin 80 MG tablet  Commonly known as:  LIPITOR  Take 1 tablet (80 mg total) by mouth daily at 6 PM.     CALCIUM-VITAMIN D PO  Take 2 tablets by mouth daily.     carvedilol 3.125 MG tablet  Commonly known as:  COREG  Take 1 tablet (3.125 mg total) by mouth 2 (two) times daily with a meal.     furosemide 20 MG tablet  Commonly known as:  LASIX  Take 1 tablet (20 mg total) by mouth daily.     isosorbide mononitrate 30 MG 24 hr tablet  Commonly known as:  IMDUR  Take 1 tablet (30 mg total) by mouth daily.     lisinopril 5 MG tablet  Commonly known as:  PRINIVIL,ZESTRIL  Take 1 tablet (5 mg total) by mouth daily.     multivitamin with minerals Tabs  Take 1 tablet by mouth daily.     nitroGLYCERIN 0.4 MG SL tablet  Commonly known as:  NITROSTAT  Place 1 tablet (0.4 mg total) under the tongue every 5 (five) minutes x 3 doses as needed for chest pain.     omeprazole 20 MG capsule  Commonly known as:  PRILOSEC  Take 20 mg by mouth daily.     OVER THE COUNTER MEDICATION  Inject 2 tablets into the skin daily. Supplement for Sciatica     Ticagrelor 90 MG Tabs tablet  Commonly known as:  BRILINTA  Take 1 tablet (90 mg total) by mouth 2 (two) times daily.     VITAMIN D PO  Take 1 tablet by mouth  daily.         Duration of Discharge Encounter: Greater than 30 minutes including physician time.  Jolene Provost PA-C 10/25/2012 1:54 PM

## 2012-10-28 ENCOUNTER — Telehealth: Payer: Self-pay | Admitting: Cardiology

## 2012-10-28 NOTE — Telephone Encounter (Signed)
I attempted TCM call, patient did not answer I left a message I will call her back tomorrow.

## 2012-11-01 ENCOUNTER — Ambulatory Visit (INDEPENDENT_AMBULATORY_CARE_PROVIDER_SITE_OTHER): Payer: Medicare Other | Admitting: Cardiology

## 2012-11-01 ENCOUNTER — Encounter: Payer: Self-pay | Admitting: Cardiology

## 2012-11-01 VITALS — BP 92/66 | HR 52 | Ht 65.0 in | Wt 216.8 lb

## 2012-11-01 DIAGNOSIS — I10 Essential (primary) hypertension: Secondary | ICD-10-CM

## 2012-11-01 DIAGNOSIS — I2109 ST elevation (STEMI) myocardial infarction involving other coronary artery of anterior wall: Secondary | ICD-10-CM

## 2012-11-01 DIAGNOSIS — I2589 Other forms of chronic ischemic heart disease: Secondary | ICD-10-CM

## 2012-11-01 DIAGNOSIS — I2102 ST elevation (STEMI) myocardial infarction involving left anterior descending coronary artery: Secondary | ICD-10-CM

## 2012-11-01 DIAGNOSIS — I498 Other specified cardiac arrhythmias: Secondary | ICD-10-CM

## 2012-11-01 DIAGNOSIS — I255 Ischemic cardiomyopathy: Secondary | ICD-10-CM

## 2012-11-01 DIAGNOSIS — R001 Bradycardia, unspecified: Secondary | ICD-10-CM

## 2012-11-01 NOTE — Assessment & Plan Note (Signed)
HR 52 on low dose Coreg, asymptomatic

## 2012-11-01 NOTE — Progress Notes (Signed)
11/01/2012 Susan [name]   Jul 06, 1934  [id number]  Primary Starr Sinclair, MD Primary Cardiologist: Dr Tresa Endo  HPI:  78 y/o who presented to [facility] 10/19/12. She was a late presentation MI- her initial presentation was shoulder pain. She had an LAD DES 10/19/12. EF was 35-40%. She had continued chest pain and a bump in her Troponin and she was brought back to the lab 10/24/12 and had a CFX DES. Since discharge she denies angina. She notes some dyspnea but no orthopnea or edema. She has not had near syncope or syncope. Her HR is in the low 50s. She does have a dry cough.   Current Outpatient Prescriptions  Medication Sig Dispense Refill  . acetaminophen (TYLENOL) 650 MG CR tablet Take 650 mg by mouth daily.      [name] aspirin EC 81 MG tablet Take 81 mg by mouth daily.      [name] atorvastatin (LIPITOR) 80 MG tablet Take 1 tablet (80 mg total) by mouth daily at 6 PM.  30 tablet  11  . CALCIUM-VITAMIN D PO Take 2 tablets by mouth daily.      . carvedilol (COREG) 3.125 MG tablet Take 1 tablet (3.125 mg total) by mouth 2 (two) times daily with a meal.  60 tablet  11  . Cholecalciferol (VITAMIN D PO) Take 1 tablet by mouth daily.      . furosemide (LASIX) 20 MG tablet Take 1 tablet (20 mg total) by mouth daily.  30 tablet  11  . isosorbide mononitrate (IMDUR) 30 MG 24 hr tablet Take 1 tablet (30 mg total) by mouth daily.  30 tablet  11  . Multiple Vitamin (MULTIVITAMIN WITH MINERALS) TABS Take 1 tablet by mouth daily.      . nitroGLYCERIN (NITROSTAT) 0.4 MG SL tablet Place 1 tablet (0.4 mg total) under the tongue every 5 (five) minutes x 3 doses as needed for chest pain.  25 tablet  2  . omeprazole (PRILOSEC) 20 MG capsule Take 20 mg by mouth daily.      . Ticagrelor (BRILINTA) 90 MG TABS tablet Take 1 tablet (90 mg total) by mouth 2 (two) times daily.  60 tablet  11   No current facility-administered medications for this visit.    Allergies  Allergen Reactions  . Penicillins Rash    History    Social History  . Marital Status: Married    Spouse Name: N/A    Number of Children: N/A  . Years of Education: N/A   Occupational History  . Not on file.   Social History Main Topics  . Smoking status: Never Smoker   . Smokeless tobacco: Never Used  . Alcohol Use: 0.6 oz/week    1 Glasses of wine per week  . Drug Use: No  . Sexually Active: Yes   Other Topics Concern  . Not on file   Social History Narrative  . No narrative on file     Review of Systems: General: negative for chills, fever, night sweats or weight changes.  Cardiovascular: negative for chest pain, dyspnea on exertion, edema, orthopnea, palpitations, paroxysmal nocturnal dyspnea or shortness of breath Dermatological: negative for rash Respiratory: negative for cough or wheezing Urologic: negative for hematuria Abdominal: negative for nausea, vomiting, diarrhea, bright red blood per rectum, melena, or hematemesis Neurologic: negative for visual changes, syncope, or dizziness All other systems reviewed and are otherwise negative except as noted above.    Blood pressure 92/66, pulse 52, height 5\' 5"  (1.651 m), weight  216 lb 12.8 oz (98.34 kg).  General appearance: alert, cooperative, no distress and moderately obese Lungs: clear to auscultation bilaterally Heart: regular rate and rhythm Extremities: trace edema  EKG  EKG: sinus bradycardia, 1st AVB, inferior Qs, septal Qs, diffuse TWI.  ASSESSMENT AND PLAN:   STEMI 10/19/12- LAD DES, and CFX DES 10/24/12 Doing well, no angina or shoulder pain  HTN (hypertension) She is hypotensive but asymptomatic (she needs a large cuff). B/P 92/60  Bradycardia- tolerating low dose beta blocker HR 52 on low dose Coreg, asymptomatic  Cardiomyopathy, ischemic: EF 35-40% by echo post-MI 10/20/12 She has a cough and some dyspnea but I think this is from ACE and Brilinta   PLAN  I stopped her ACE because of cough. I di not start an ARB as her B/P is borderline. I  continued her Coreg as she is asymptomatic with her bradycardia. She will follow up with Dr Tresa Endo in two weeks.   Damali Broadfoot KPA-C 11/01/2012 2:59 PM

## 2012-11-01 NOTE — Assessment & Plan Note (Signed)
She has a cough and some dyspnea but I think this is from ACE and Brilinta

## 2012-11-01 NOTE — Assessment & Plan Note (Signed)
Doing well, no angina or shoulder pain

## 2012-11-01 NOTE — Assessment & Plan Note (Signed)
She is hypotensive but asymptomatic (she needs a large cuff). B/P 92/60

## 2012-11-01 NOTE — Patient Instructions (Addendum)
Stop Lisinopril, keep appointment with Dr Tresa Endo

## 2012-11-12 ENCOUNTER — Ambulatory Visit (INDEPENDENT_AMBULATORY_CARE_PROVIDER_SITE_OTHER): Payer: Medicare Other | Admitting: Physician Assistant

## 2012-11-12 ENCOUNTER — Encounter: Payer: Self-pay | Admitting: Physician Assistant

## 2012-11-12 ENCOUNTER — Telehealth: Payer: Self-pay | Admitting: Cardiovascular Disease

## 2012-11-12 VITALS — BP 102/70 | HR 56 | Ht 65.0 in | Wt 214.5 lb

## 2012-11-12 DIAGNOSIS — I255 Ischemic cardiomyopathy: Secondary | ICD-10-CM

## 2012-11-12 DIAGNOSIS — I2510 Atherosclerotic heart disease of native coronary artery without angina pectoris: Secondary | ICD-10-CM

## 2012-11-12 DIAGNOSIS — I2589 Other forms of chronic ischemic heart disease: Secondary | ICD-10-CM

## 2012-11-12 DIAGNOSIS — I10 Essential (primary) hypertension: Secondary | ICD-10-CM

## 2012-11-12 NOTE — Telephone Encounter (Signed)
Hypotension and bradycardia 97/49-RA, 97/80-LA w/heart rate of 40.  C/O lightheadedness with standing any length of time.  Worked in to [place]' schedule.

## 2012-11-12 NOTE — Telephone Encounter (Signed)
Please call-Blood pressure is real low-today it is 97/49 right arm,left arm it is 97/80 and pulse rate is 40-she still have the hacking cough.

## 2012-11-12 NOTE — Assessment & Plan Note (Signed)
No current angina 

## 2012-11-12 NOTE — Patient Instructions (Addendum)
Stop taking Coreg.  Can use dextromethorphan for cough.  Followup with Dr. Tresa Endo on August 28,  3:00PM

## 2012-11-12 NOTE — Assessment & Plan Note (Signed)
Patient is having a persistently low systolicblood pressures in the 90s along with heart rate in the 40 due to persistent coughs even during the day. In going to discontinue her Coreg which is only a 3.125 mg twice a day.  Her lisinopril was stopped previously by [facility] due to persistent cough.  She still has some cough but it does not seem to be as frequent.  It is also nonproductive.

## 2012-11-12 NOTE — Assessment & Plan Note (Signed)
No overt signs of heart failure. Weight has been very stable.[name]

## 2012-11-12 NOTE — Progress Notes (Signed)
Date:  11/12/2012   ID:  Susan [name], Susan [name] [birth year], MRN [id number]  PCP:  Dorothey Baseman, MD  Primary Cardiologist:  Tresa Endo     History of Present Illness: Susan [name] is a 78 y.o. female  Who presented to [facility] 10/19/12. She was a late presentation MI- her initial presentation was shoulder pain. She had an LAD DES 10/19/12. EF was 35-40%. She had continued chest pain and a bump in her Troponin and she was brought back to the lab 10/24/12 and had a CFX DES.    The patient's had a persistent cough and her ACE inhibitor was discontinued by [facility]. On August 8.  She indicates she still has a cough but it's not as frequent. It is worse when she lays down.  She is here because of persistent hypotension and the low heart rate. Her blood pressure has ranged in the low 90s systolic to 130. Her heart rate is getting as low as 40 during the middle of the day.   The patient currently denies nausea, vomiting, fever, chest pain, orthopnea, dizziness, PND, cough, congestion, abdominal pain, hematochezia, melena, lower extremity edema.  Wt Readings from Last 3 Encounters:  11/12/12 214 lb 8 oz (97.297 kg)  11/01/12 216 lb 12.8 oz (98.34 kg)  10/23/12 222 lb 7.1 oz (100.9 kg)     Past Medical History  Diagnosis Date  . Hypertension     Current Outpatient Prescriptions  Medication Sig Dispense Refill  . acetaminophen (TYLENOL) 650 MG CR tablet Take 650 mg by mouth daily.      [name] aspirin EC 81 MG tablet Take 81 mg by mouth daily.      [name] atorvastatin (LIPITOR) 80 MG tablet Take 1 tablet (80 mg total) by mouth daily at 6 PM.  30 tablet  11  . CALCIUM-VITAMIN D PO Take 2 tablets by mouth daily.      . Cholecalciferol (VITAMIN D PO) Take 1 tablet by mouth daily.      . furosemide (LASIX) 20 MG tablet Take 1 tablet (20 mg total) by mouth daily.  30 tablet  11  . isosorbide mononitrate (IMDUR) 30 MG 24 hr tablet Take 1 tablet (30 mg total) by mouth daily.  30 tablet  11  . Multiple  Vitamin (MULTIVITAMIN WITH MINERALS) TABS Take 1 tablet by mouth daily.      . nitroGLYCERIN (NITROSTAT) 0.4 MG SL tablet Place 1 tablet (0.4 mg total) under the tongue every 5 (five) minutes x 3 doses as needed for chest pain.  25 tablet  2  . omeprazole (PRILOSEC) 20 MG capsule Take 20 mg by mouth daily.      . Ticagrelor (BRILINTA) 90 MG TABS tablet Take 1 tablet (90 mg total) by mouth 2 (two) times daily.  60 tablet  11   No current facility-administered medications for this visit.    Allergies:    Allergies  Allergen Reactions  . Penicillins Rash    Social History:  The patient  reports that she has never smoked. She has never used smokeless tobacco. She reports that she drinks about 0.6 ounces of alcohol per week. She reports that she does not use illicit drugs.   Family history:  No family history on file.  ROS:  Please see the history of present illness.  All other systems reviewed and negative.   PHYSICAL EXAM: VS:  BP 102/70  Pulse 56  Ht 5\' 5"  (1.651 m)  Wt 214 lb 8 oz (  97.297 kg)  BMI 35.69 kg/m2 Obese, well developed, in no acute distress HEENT: Pupils are equal round react to light accommodation extraocular movements are intact.  Neck: no JVDNo cervical lymphadenopathy. Cardiac: Regular rate and rhythm with very soft 1/6 MM Lungs:  clear to auscultation bilaterally, no wheezing, rhonchi or rales Ext: no lower extremity edema.  2+ radial and dorsalis pedis pulses. Skin: warm and dry Neuro:  Grossly normal  EKG:  Assessment bradycardia rate 56 beats per minute first degree AV block PR interval 270 ms inferior T wave inversion as well as V 2-6  ASSESSMENT AND PLAN:  Problem List Items Addressed This Visit   HTN (hypertension) (Chronic)      Patient is having a persistently low systolicblood pressures in the 90s along with heart rate in the 40 due to persistent coughs even during the day. In going to discontinue her Coreg which is only a 3.125 mg twice a day.  Her  lisinopril was stopped previously by [facility] due to persistent cough.  She still has some cough but it does not seem to be as frequent.  It is also nonproductive.    Cardiomyopathy, ischemic: EF 35-40% by echo post-MI 10/20/12 (Chronic)     No overt signs of heart failure. Weight has been very stable.[name]    CAD (coronary artery disease)- elective CFX DES 10/24/12 for recurrent angina - Primary (Chronic)     No current angina.    Relevant Orders      EKG 12-Lead

## 2012-11-18 ENCOUNTER — Other Ambulatory Visit: Payer: Self-pay | Admitting: *Deleted

## 2012-11-19 ENCOUNTER — Telehealth: Payer: Self-pay | Admitting: Cardiovascular Disease

## 2012-11-19 NOTE — Telephone Encounter (Signed)
Is needing a diagnosis code for the reason she was in the hospital on 10/19/12.Susan [name] Please Call  Thanks

## 2012-11-19 NOTE — Telephone Encounter (Signed)
Returned call.  Left message on secure voicemail w/ pt info and Dx code: [phone] per Bjorn Loser in billing who was notified prior to returning call.  Call back before 4pm if questions.

## 2012-11-21 ENCOUNTER — Encounter: Payer: Self-pay | Admitting: Cardiovascular Disease

## 2012-11-21 ENCOUNTER — Ambulatory Visit (INDEPENDENT_AMBULATORY_CARE_PROVIDER_SITE_OTHER): Payer: Medicare Other | Admitting: Cardiovascular Disease

## 2012-11-21 VITALS — BP 106/68 | HR 62 | Ht 65.0 in | Wt 214.5 lb

## 2012-11-21 DIAGNOSIS — I2510 Atherosclerotic heart disease of native coronary artery without angina pectoris: Secondary | ICD-10-CM

## 2012-11-21 DIAGNOSIS — E785 Hyperlipidemia, unspecified: Secondary | ICD-10-CM

## 2012-11-21 DIAGNOSIS — I255 Ischemic cardiomyopathy: Secondary | ICD-10-CM

## 2012-11-21 DIAGNOSIS — I10 Essential (primary) hypertension: Secondary | ICD-10-CM

## 2012-11-21 DIAGNOSIS — I2589 Other forms of chronic ischemic heart disease: Secondary | ICD-10-CM

## 2012-11-21 MED ORDER — FUROSEMIDE 20 MG PO TABS: 20.0000 mg | ORAL_TABLET | Freq: Every day | ORAL | Status: DC

## 2012-11-21 MED ORDER — LOSARTAN POTASSIUM 25 MG PO TABS: 25.0000 mg | ORAL_TABLET | Freq: Every day | ORAL | Status: DC

## 2012-11-21 MED ORDER — ATORVASTATIN CALCIUM 80 MG PO TABS: 80.0000 mg | ORAL_TABLET | Freq: Every day | ORAL | Status: DC

## 2012-11-21 NOTE — Patient Instructions (Addendum)
Your physician has recommended you make the following change in your medication: start new prescription for Losartan.  Your physician recommends that you return for lab work fasting.  Your physician has requested that you have en exercise stress myoview. For further information please visit [email or web address] Please follow instruction sheet, as given. This will be scheduled in the next 3-4 weeks. Your physician recommends that you schedule a follow-up appointment in: 4-6 WEEKS

## 2012-11-26 ENCOUNTER — Telehealth: Payer: Self-pay | Admitting: Cardiovascular Disease

## 2012-11-26 NOTE — Telephone Encounter (Signed)
Returning your call. °

## 2012-11-26 NOTE — Telephone Encounter (Signed)
Pt said that she was returning your call

## 2012-11-26 NOTE — Telephone Encounter (Signed)
Spoke with patient's husband . Patient not available. [facility] He will be picking her up at 11:00. He will have her to call back.

## 2012-11-26 NOTE — Telephone Encounter (Signed)
Patient  Phoned with complaints of having some clotted blood in her urine that started yesterday. She read the side effects of her medications and the Brilinta says that it can cause this. Informed patient per Dr. Herbie Baltimore to D/C the aspirin continue the Brilinta and consult a urologist. Patient voiced understanding.

## 2012-11-26 NOTE — Telephone Encounter (Signed)
Has a dry hacking cough,SOB, and yesterday started bleeding from her vagina-she thinks its one of her medicine.

## 2012-11-27 ENCOUNTER — Telehealth (HOSPITAL_COMMUNITY): Payer: Self-pay | Admitting: *Deleted

## 2012-11-27 ENCOUNTER — Telehealth: Payer: Self-pay | Admitting: Cardiovascular Disease

## 2012-11-27 DIAGNOSIS — R319 Hematuria, unspecified: Secondary | ICD-10-CM

## 2012-11-27 NOTE — Telephone Encounter (Signed)
Pt is calling in regards to Brilinta she took last night and got up yesterday and 4:30 in the morning to use the bathroom and she was bleeding. She wanted a phone call back.

## 2012-11-27 NOTE — Telephone Encounter (Signed)
Returning your call. °

## 2012-11-27 NOTE — Telephone Encounter (Signed)
Unfortunately - she is only ~2 months s/p PCI with DES - to high risk for acute stent thrombosis. Can definitely stop ASA.  & hold 1 dose of Brilinta.  Would not stop completely.  Needs urgent urology f/u.  Marykay Lex, MD

## 2012-11-27 NOTE — Telephone Encounter (Signed)
Returned call.  Pt stated she's on Brilinta.  Stated she started bleeding on Monday.  Stated she called yesterday and followed advice from Dr. Herbie Baltimore.  Took Brilinta last night and did not take ASA today.  Stated when she woke up at 4:30 am she had blood w/ urination and it looked just like blood.  Also stated she had bleeding at 8:30am too.  Pt stated she called PCP yesterday and they did UA and sent to lab.  No results yet.  Fax number given to [facility] for pt to have results sent: Attn: Hospital doctor, Char[employer] fundraiser.  Pt informed when results received will call her with further instructions.  Denied dizziness today and c/o some lightheadedness.  Stated BP was 97/50 HR 67 today and she normally has lightheadedness when BP low.  No other complaints except bleeding.  Pt will call PCP to have results faxed.

## 2012-11-27 NOTE — Telephone Encounter (Signed)
Returned call.  Left message to call back before 4pm.  

## 2012-11-27 NOTE — Telephone Encounter (Signed)
Returned call and informed pt per instructions by MD/PA.  Pt verbalized understanding and agreed w/ plan.  Referral ordered and lab results given to schedulers to fax when appt set up.

## 2012-11-27 NOTE — Telephone Encounter (Signed)
Lab results (UA) received and given to L. Annie Paras, NP to review and advise.  Advised pt stop ASA and see Urology.  Stated pt cannot stop Brilinta.  Also advised Drs. Herbie Baltimore be notified that pt still bleeding and inform labs do not look like infection.  Message forwarded to Dr. Herbie Baltimore for further instructions before contacting pt.  Dr. Tresa Endo is primary cardiologist and is out of the office today.

## 2012-11-27 NOTE — Telephone Encounter (Signed)
See previous phone note.  

## 2012-11-28 ENCOUNTER — Encounter: Payer: Self-pay | Admitting: Cardiovascular Disease

## 2012-11-28 NOTE — Progress Notes (Signed)
Patient ID: Susan [name], female   DOB: 1934-06-21, 78 y.o.   MRN: [id number]     HPI: Susan [name], is a 78 y.o. female who presents to the office today for cardiology evaluation following her recent ST segment elevation MI with urgent intervention to her LAD and subsequent staged procedure to her left circumflex vessel.  [name] is a 78 year old female who is originally from [place] and now resides in [place]. On 10/19/2012 she presented [facility] in transfer from [facility] with an anterior wall ST segment elevation myocardial infarction. At that time, she did have chest pain of greater than 12 hours duration. I took her to lead to the cardiac catheterization laboratory where her LAD was found to be totally occluded and she underwent stenting of her LAD extending from the ostium to mid region with 3.0x38 mm and 3.0x16 mm Promus premier DES stents. Initially she did have significant wall motion abnormality involving the LAD territory. Catching, and CAD with 95% mid AV groove circumflex stenosis. She involves significant ECG changes and also experienced recurrent episodes of back discomfort several days later on 10/23/2012 she underwent successful intervention with insertion of a 2.25x12 mm Promus premier DES stent into the circumflex vessel. Socially, she has done well. She did see a physician extenders on 2 occasions post discharge and apparently due to low blood pressure and slow pulse her car labetalol was ultimately discontinued and apparently her lisinopril was stopped due today cough.  She will be participating in cardiac rehabilitation but has not yet started this and this will be [facility]. She presents now for evaluation.   Past Medical History  Diagnosis Date  . Hypertension     History reviewed. No pertinent past surgical history.  Allergies  Allergen Reactions  . Penicillins Rash    Current Outpatient Prescriptions  Medication Sig Dispense  Refill  . acetaminophen (TYLENOL) 650 MG CR tablet Take 650 mg by mouth daily.      [name] aspirin EC 81 MG tablet Take 81 mg by mouth daily.      [name] atorvastatin (LIPITOR) 80 MG tablet Take 1 tablet (80 mg total) by mouth daily at 6 PM.  30 tablet  11  . CALCIUM-VITAMIN D PO Take 2 tablets by mouth daily.      . Cholecalciferol (VITAMIN D PO) Take 1 tablet by mouth daily.      [name] Dextromethorphan Polistirex ([employer] COUGH DM PO) Take by mouth.      . furosemide (LASIX) 20 MG tablet Take 1 tablet (20 mg total) by mouth daily.  30 tablet  11  . isosorbide mononitrate (IMDUR) 30 MG 24 hr tablet Take 1 tablet (30 mg total) by mouth daily.  30 tablet  11  . Multiple Vitamin (MULTIVITAMIN WITH MINERALS) TABS Take 1 tablet by mouth daily.      . nitroGLYCERIN (NITROSTAT) 0.4 MG SL tablet Place 1 tablet (0.4 mg total) under the tongue every 5 (five) minutes x 3 doses as needed for chest pain.  25 tablet  2  . omeprazole (PRILOSEC) 20 MG capsule Take 20 mg by mouth daily.      . Ticagrelor (BRILINTA) 90 MG TABS tablet Take 1 tablet (90 mg total) by mouth 2 (two) times daily.  60 tablet  11  . losartan (COZAAR) 25 MG tablet Take 1 tablet (25 mg total) by mouth daily.  30 tablet  6   No current facility-administered medications for this visit.    History  Social History  . Marital Status: Married    Spouse Name: N/A    Number of Children: N/A  . Years of Education: N/A   Occupational History  . Not on file.   Social History Main Topics  . Smoking status: Never Smoker   . Smokeless tobacco: Never Used  . Alcohol Use: 0.6 oz/week    1 Glasses of wine per week  . Drug Use: No  . Sexual Activity: Yes   Other Topics Concern  . Not on file   Social History Narrative  . No narrative on file   socially she is married. There is no tobacco use. She drinks alcohol rarely.  History reviewed. No pertinent family history.  ROS is negative for fevers, chills or night sweats.  She will be participating in  cardiac rehabilitation. Her blood pressure has been low and she had developed fatigue. This has improved following discontinuance of her carvedilol. Her cough has improved with discontinuance of lisinopril. She denies wheezing. She is unaware of tachycardia palpitations. She denies bleeding. She denies abdominal pain. She denies leg swelling. She denies myalgias. Other system review is negative.  PE BP 106/68  Pulse 62  Ht 5\' 5"  (1.651 m)  Wt 214 lb 8 oz (97.297 kg)  BMI 35.69 kg/m2  General: Alert, oriented, no distress.  Skin: normal turgor, no rashes HEENT: Normocephalic, atraumatic. Pupils round and reactive; sclera anicteric;no lid lag.  Nose without nasal septal hypertrophy Mouth/Parynx benign; Mallinpatti scale 3 Neck: No JVD, no carotid briuts Lungs: clear to ausculatation and percussion; no wheezing or rales Heart: RRR, s1 s2 normal 1/6 systolic Abdomen: soft, nontender; no hepatosplenomehaly, BS+; abdominal aorta nontender and not dilated by palpation. Pulses 2+ Extremities: no clubbing cyanosis or edema, Homan's sign negative  Neurologic: grossly nonfocal  ECG  sinus rhythm at 62 beats per minute. First degree AV block with a purulent 244 ms. QTc interval 452 ms. She has QS complexes V1 through V6 with T-wave inversion 1 and L. II, III, and F and V2 through V6  LABS:  BMET    Component Value Date/Time   NA 140 10/24/2012 0428   K 4.1 10/24/2012 0428   CL 107 10/24/2012 0428   CO2 25 10/24/2012 0428   GLUCOSE 99 10/24/2012 0428   BUN 16 10/24/2012 0428   CREATININE 0.91 10/24/2012 0428   CALCIUM 9.2 10/24/2012 0428   GFRNONAA 60* 10/24/2012 0428   GFRAA 70* 10/24/2012 0428     Hepatic Function Panel     Component Value Date/Time   PROT 6.5 10/19/2012 2350   ALBUMIN 3.6 10/19/2012 2350   AST 148* 10/19/2012 2350   ALT 25 10/19/2012 2350   ALKPHOS 88 10/19/2012 2350   BILITOT 0.3 10/19/2012 2350     CBC    Component Value Date/Time   WBC 7.9 10/24/2012 0428   RBC 3.88  10/24/2012 0428   HGB 11.8* 10/24/2012 0428   HCT 35.0* 10/24/2012 0428   PLT 237 10/24/2012 0428   MCV 90.2 10/24/2012 0428   MCH 30.4 10/24/2012 0428   MCHC 33.7 10/24/2012 0428   RDW 13.7 10/24/2012 0428     BNP    Component Value Date/Time   PROBNP 1918.0* 10/23/2012 0420    Lipid Panel     Component Value Date/Time   CHOL 170 10/20/2012 0420   TRIG 202* 10/20/2012 0420   HDL 40 10/20/2012 0420   CHOLHDL 4.3 10/20/2012 0420   VLDL 40 10/20/2012 0420   LDLCALC 90  10/20/2012 0420     RADIOLOGY: No results found.    ASSESSMENT AND PLAN: [name] is a very pleasant 78 year old female who presented to [facility] on 10/19/2012 after an approximate 12 hour delay from back and chest discomfort. When she arrived to [facility] she had excellent portable time at 27 minutes in the setting of an anterior wall ST segment elevation myocardial infarction. She underwent successful stenting of her LAD and D2 diffuse disease extending posteriorly to tandem DES stents were inserted. 4 days later stage intervention to the circumflex was successful. She did have concomitant 56% RCA narrowing and a large dominant right coronary artery. I am giving her permission to participate in [facility] cardiac rehabilitation program. I scheduled her for a nuclear perfusion study to document extent of scar versus myocardial salvage following her recent myocardial infarction to her LAD territory in stage intervention to her circumflex and to make sure she's not having ischemia in the RCA territory. The exercise test will also be used for exercise prescription for a cardiac rehabilitation program. I am recommending initiation of ARB therapy with losartan 25 mg. I will check laboratory including an NMR lipoprotein of assess for aggressive lipid therapy. I will see her back in 4-6 weeks following her upcoming nuclear perfusion study.     Lennette Bihari, MD, [facility]  11/28/2012 7:01 PM

## 2012-11-28 NOTE — Telephone Encounter (Signed)
Tom = thought you may want to field further ?s on this issue.  PCI ~7/30 with DES - on ASA & Brilinta --> having hematuria.   I said ok to stop ASA, but not Brilinta.  Susan [name] W

## 2012-12-03 ENCOUNTER — Telehealth: Payer: Self-pay | Admitting: Cardiovascular Disease

## 2012-12-03 ENCOUNTER — Ambulatory Visit (HOSPITAL_COMMUNITY)
Admission: RE | Admit: 2012-12-03 | Discharge: 2012-12-03 | Disposition: A | Discharge status: Home / Self Care | Payer: Medicare Other | Source: Ambulatory Visit | Attending: Cardiovascular Disease | Admitting: Cardiovascular Disease

## 2012-12-03 DIAGNOSIS — Z87891 Personal history of nicotine dependence: Secondary | ICD-10-CM | POA: Insufficient documentation

## 2012-12-03 DIAGNOSIS — R42 Dizziness and giddiness: Secondary | ICD-10-CM | POA: Insufficient documentation

## 2012-12-03 DIAGNOSIS — Z8249 Family history of ischemic heart disease and other diseases of the circulatory system: Secondary | ICD-10-CM | POA: Insufficient documentation

## 2012-12-03 DIAGNOSIS — R5381 Other malaise: Secondary | ICD-10-CM | POA: Insufficient documentation

## 2012-12-03 DIAGNOSIS — R0989 Other specified symptoms and signs involving the circulatory and respiratory systems: Secondary | ICD-10-CM | POA: Insufficient documentation

## 2012-12-03 DIAGNOSIS — I2510 Atherosclerotic heart disease of native coronary artery without angina pectoris: Secondary | ICD-10-CM | POA: Insufficient documentation

## 2012-12-03 DIAGNOSIS — R0609 Other forms of dyspnea: Secondary | ICD-10-CM | POA: Insufficient documentation

## 2012-12-03 LAB — COMPREHENSIVE METABOLIC PANEL
AST: 23 U/L (ref 0–37)
Albumin: 4 g/dL (ref 3.5–5.2)
Alkaline Phosphatase: 93 U/L (ref 39–117)
BUN: 21 mg/dL (ref 6–23)
Creat: 0.93 mg/dL (ref 0.50–1.10)
Potassium: 3.7 mEq/L (ref 3.5–5.3)

## 2012-12-03 LAB — CBC
MCV: 87.4 fL (ref 78.0–100.0)
Platelets: 326 10*3/uL (ref 150–400)
RDW: 13.8 % (ref 11.5–15.5)
WBC: 7.4 10*3/uL (ref 4.0–10.5)

## 2012-12-03 MED ORDER — TECHNETIUM TC 99M SESTAMIBI GENERIC - CARDIOLITE
30.0000 | Freq: Once | INTRAVENOUS | Status: AC | PRN
  Administered 2012-12-03: 30 via INTRAVENOUS

## 2012-12-03 MED ORDER — TECHNETIUM TC 99M SESTAMIBI GENERIC - CARDIOLITE
10.0000 | Freq: Once | INTRAVENOUS | Status: AC | PRN
  Administered 2012-12-03: 10 via INTRAVENOUS

## 2012-12-03 MED ORDER — REGADENOSON 0.4 MG/5ML IV SOLN
0.4000 mg | Freq: Once | INTRAVENOUS | Status: AC
  Administered 2012-12-03: 0.4 mg via INTRAVENOUS

## 2012-12-03 NOTE — Telephone Encounter (Signed)
Pt needs to have cardiac rehab and they need to the order for rehab faxed to [phone]. They are not on Epic.

## 2012-12-03 NOTE — Telephone Encounter (Signed)
Susan [name] schedules the cardiac rehab appointments. She can get Dr. Landry Dyke last office note and do the referral based on his note. She does the paper referrals.

## 2012-12-03 NOTE — Procedures (Addendum)
[facility] [address] [place] [address] [phone]  Cardiology Nuclear Med Study  Susan [name] is a 78 y.o. female     MRN : [id number]     DOB: [birth year]  Procedure Date: 12/03/2012  Nuclear Med Background Indication for Stress Test:  Evaluation for Ischemia and Stent Patency History:  CAD;MI-09/2012;STENT/PTCA--10/24/2012;NSVT Cardiac Risk Factors: Family History - CAD, History of Smoking, Lipids and Obesity  Symptoms:  DOE, Fatigue, Light-Headedness and SOB   Nuclear Pre-Procedure Caffeine/Decaff Intake:  8:00pm NPO After: 6:00am   IV Site: R Hand  IV 0.9% NS with Angio Cath:  22g  Chest Size (in):  N/A IV Started by: Emmit Pomfret, RN  Height: 5\' 5"  (1.651 m)  Cup Size: C  BMI:  Body mass index is 35.61 kg/(m^2). Weight:  214 lb (97.07 kg)   Tech Comments:  Patient was unable to reach target heart rate due to fatigue and shortness of breath. Test was changed to a [employer].    Nuclear Med Study 1 or 2 day study: 1 day  Stress Test Type:  Stress  Order Authorizing Provider:  Nicki Guadalajara, MD   Resting Radionuclide: Technetium 67m Sestamibi  Resting Radionuclide Dose: 10.3 mCi   Stress Radionuclide:  Technetium 53m Sestamibi  Stress Radionuclide Dose: 31.8 mCi           Stress Protocol Rest HR: 60 Stress HR: 74  Rest BP: 142/72 Stress BP: 102/55  Exercise Time (min): n/a METS: n/a          Dose of Adenosine (mg):  n/a Dose of Lexiscan: 0.4 mg  Dose of Atropine (mg): n/a Dose of Dobutamine: n/a mcg/kg/min (at max HR)  Stress Test Technologist: Ernestene Mention, CCT Nuclear Technologist: Koren Shiver, CNMT   Rest Procedure:  Myocardial perfusion imaging was performed at rest 45 minutes following the intravenous administration of Technetium 95m Sestamibi. Stress Procedure:  The patient received IV Lexiscan 0.4 mg over 15-seconds.  Technetium 70m Sestamibi injected at 30-seconds.  There  were no significant changes with Lexiscan.  Quantitative spect images were obtained after a 45 minute delay.  Transient Ischemic Dilatation (Normal <1.22):  0.92 Lung/Heart Ratio (Normal <0.45):  0.28 QGS EDV:  109 ml QGS ESV:  37 ml LV Ejection Fraction: 66%  Rest ECG: NSR with Q waves in V1-V5  Stress ECG: There are scattered PVCs.  QPS Raw Data Images:  Normal; no motion artifact; normal heart/lung ratio. Stress Images:  Dense area of decreased uptake at the apex, distal anteroseptum and inferoapical walls. Rest Images:  Dense area of decreased uptake at the apex, distal anteroseptum and inferoapical walls Subtraction (SDS):  No evidence of ischemia.SDS 1.  Impression Exercise Capacity:  Poor exercise capacity. Study converted to lexiscan due to fatigue and dyspnea. BP Response:  Normal blood pressure response. Clinical Symptoms:  Fatigue, dyspnea ECG Impression:  There are scattered PVCs. Comparison with Prior Nuclear Study: No previous nuclear study performed  Overall Impression:  Low risk stress nuclear study with a distal anteroapical, apical, septal and inferoapical scar. No evidence for ischemia.  LV Wall Motion:  EF 66%, apical akinesis.  Chrystie Nose, MD, [facility] Board Certified in Nuclear Cardiology Attending Cardiologist [facility]  Chrystie Nose, MD  12/03/2012 11:57 AM

## 2012-12-03 NOTE — Telephone Encounter (Signed)
Message forwarded to W. Waddell, CMA.  

## 2012-12-04 LAB — NMR LIPOPROFILE WITH LIPIDS
HDL Size: 9 nm — ABNORMAL LOW (ref >=9.2)
HDL-C: 40 mg/dL (ref >=40)
LDL (calc): 90 mg/dL (ref <100)
LDL Particle Number: 1352 nmol/L — ABNORMAL HIGH (ref <1000)
LDL Size: 20.7 nm (ref >20.5)
Large HDL-P: 4.1 umol/L — ABNORMAL LOW (ref >=4.8)
VLDL Size: 44.2 nm (ref <=46.6)

## 2012-12-04 LAB — TSH: TSH: 0.45 u[IU]/mL (ref 0.350–4.500)

## 2012-12-05 NOTE — Telephone Encounter (Signed)
Just recently had DES stents; cannot stop brilinta yet due to risk of stent thrombosis.

## 2012-12-05 NOTE — Telephone Encounter (Signed)
Agree with plan; f/u u/a

## 2012-12-05 NOTE — Telephone Encounter (Signed)
Message forwarded to Dr. Tresa Endo to review last message from pt r/t clearance for kidney stones.

## 2012-12-05 NOTE — Telephone Encounter (Signed)
The bleeding she had the week of September 1st was due to kidney stones in her left kidney, To remove them she needs to be off her aspirin and Brilinta  For 7 days.Please call her.

## 2012-12-06 NOTE — Telephone Encounter (Signed)
Returned call to pt and informed per Dr. Tresa Endo.  Pt verbalized understanding and agreed w/ plan.  Pt wanted to know if she would be able to stop it in about 6 months and informed standards indicate blood thinners for 12 months post stent.  If unable to perform procedure while on blood thinner, will have to postpone due to risk of having a blood clot off Brilinta.  Pt verbalized understanding and agreed w/ plan.

## 2012-12-09 ENCOUNTER — Encounter: Payer: Self-pay | Admitting: *Deleted

## 2012-12-09 ENCOUNTER — Other Ambulatory Visit: Payer: Self-pay | Admitting: *Deleted

## 2012-12-09 MED ORDER — EZETIMIBE 10 MG PO TABS: 10.0000 mg | ORAL_TABLET | Freq: Every day | ORAL | Status: DC

## 2012-12-13 ENCOUNTER — Telehealth: Payer: Self-pay | Admitting: Cardiovascular Disease

## 2012-12-13 NOTE — Telephone Encounter (Signed)
Message forwarded to W. Waddell, CMA.  

## 2012-12-13 NOTE — Telephone Encounter (Signed)
Needs order faxed for cardiac rehab----fax [phone]---they have faxed the request twice.

## 2012-12-13 NOTE — Telephone Encounter (Signed)
Need a referral sent to [facility] for [facility], they have received the records but not the referral and they need that before they can enroll her in to rehab.. Please call    Thanks

## 2012-12-16 ENCOUNTER — Encounter: Payer: Self-pay | Admitting: *Deleted

## 2012-12-16 NOTE — Progress Notes (Signed)
Quick Note:  Note sent to patient ______ 

## 2012-12-17 ENCOUNTER — Encounter: Payer: Self-pay | Admitting: Cardiovascular Disease

## 2012-12-18 ENCOUNTER — Encounter: Payer: Self-pay | Admitting: Cardiovascular Disease

## 2012-12-19 ENCOUNTER — Encounter: Payer: Self-pay | Admitting: Cardiovascular Disease

## 2012-12-19 ENCOUNTER — Ambulatory Visit (INDEPENDENT_AMBULATORY_CARE_PROVIDER_SITE_OTHER): Payer: Medicare Other | Admitting: Cardiovascular Disease

## 2012-12-19 VITALS — BP 124/82 | HR 62 | Ht 65.0 in | Wt 216.8 lb

## 2012-12-19 DIAGNOSIS — I2510 Atherosclerotic heart disease of native coronary artery without angina pectoris: Secondary | ICD-10-CM

## 2012-12-19 DIAGNOSIS — Z79899 Other long term (current) drug therapy: Secondary | ICD-10-CM

## 2012-12-19 DIAGNOSIS — I10 Essential (primary) hypertension: Secondary | ICD-10-CM

## 2012-12-19 DIAGNOSIS — R319 Hematuria, unspecified: Secondary | ICD-10-CM

## 2012-12-19 DIAGNOSIS — I2102 ST elevation (STEMI) myocardial infarction involving left anterior descending coronary artery: Secondary | ICD-10-CM

## 2012-12-19 DIAGNOSIS — E785 Hyperlipidemia, unspecified: Secondary | ICD-10-CM

## 2012-12-19 DIAGNOSIS — I2109 ST elevation (STEMI) myocardial infarction involving other coronary artery of anterior wall: Secondary | ICD-10-CM

## 2012-12-19 NOTE — Progress Notes (Signed)
Patient ID: Susan [name], female   DOB: 26-Oct-1934, 78 y.o.   MRN: [id number]     HPI: Susan [name], is a 77 y.o. female who presents to the office today for cardiology evaluation following her recent ST segment elevation MI with urgent intervention to her LAD and subsequent staged procedure to her left circumflex vessel.  Susan [name] is a 78 year old female who is originally from [place] and now resides in [place]. On 10/19/2012 she presented [place] in transfer from [facility] with an anterior wall ST segment elevation myocardial infarction. She did have chest pain of greater than 12 hours duration. I performed emergent cardiac catheterization  where her LAD was found to be totally occluded.  She underwent stenting of her LAD extending from the ostium to mid region with 3.0x38 mm and 3.0x16 mm Promus premier DES stents. Initially she did have significant wall motion abnormality involving the LAD territory. She also was found to have a 95% mid AV groove circumflex stenosis. She now significant ECG changes and also experienced recurrent episodes of back discomfort several days later. On 10/23/2012 she underwent successful intervention with insertion of a 2.25x12 mm Promus premier DES stent into the circumflex vessel. Subsequently she had done well. She did develop some hypotension leading to reduction in some of her medications. He now is enrolled in the [facility] cardiac rehabilitation program. I did schedule her for an exercise Myoview study which was done on 12/03/2012. This demonstrated significant salvage of myocardium in that she only had a small area of distal antral apical, apical, septal and mid flows apical scar without evidence for ischemia. Post-rest ejection fraction was 66% and there was evidence for apical akinesis. Did obtain subsequent laboratory iron current medical regimen and LDL particle number was still increased at 1352 despite being on atorvastatin 80 mg.  She was contacted and Zetia 10 mg was just added to this atorvastatin dose. She did have 735 small LDL particles and a calculated LDL of 90 with HDL 40 and triglycerides 119.  She has developed hematuria. She subsequently was found to have left kidney stones following several test. She is maintaining her current dose of Alimta. She denies chest pain. She is doing water aerobics and participating in cardiac rehabilitation.  Past Medical History  Diagnosis Date  . Hypertension     History reviewed. No pertinent past surgical history.  Allergies  Allergen Reactions  . Penicillins Rash    Current Outpatient Prescriptions  Medication Sig Dispense Refill  . acetaminophen (TYLENOL) 650 MG CR tablet Take 650 mg by mouth daily.      [name] aspirin EC 81 MG tablet Take 81 mg by mouth daily.      [name] atorvastatin (LIPITOR) 80 MG tablet Take 1 tablet (80 mg total) by mouth daily at 6 PM.  30 tablet  11  . CALCIUM-VITAMIN D PO Take 2 tablets by mouth daily.      . Cholecalciferol (VITAMIN D PO) Take 1 tablet by mouth daily.      [name] Dextromethorphan Polistirex ([employer] COUGH DM PO) Take by mouth.      . ezetimibe (ZETIA) 10 MG tablet Take 1 tablet (10 mg total) by mouth daily.  30 tablet  6  . furosemide (LASIX) 20 MG tablet Take 1 tablet (20 mg total) by mouth daily.  30 tablet  11  . isosorbide mononitrate (IMDUR) 30 MG 24 hr tablet Take 1 tablet (30 mg total) by mouth daily.  30 tablet  11  .  losartan (COZAAR) 25 MG tablet Take 1 tablet (25 mg total) by mouth daily.  30 tablet  6  . Multiple Vitamin (MULTIVITAMIN WITH MINERALS) TABS Take 1 tablet by mouth daily.      . nitroGLYCERIN (NITROSTAT) 0.4 MG SL tablet Place 1 tablet (0.4 mg total) under the tongue every 5 (five) minutes x 3 doses as needed for chest pain.  25 tablet  2  . omeprazole (PRILOSEC) 20 MG capsule Take 20 mg by mouth daily.      . Ticagrelor (BRILINTA) 90 MG TABS tablet Take 1 tablet (90 mg total) by mouth 2 (two) times daily.  60 tablet   11   No current facility-administered medications for this visit.    History   Social History  . Marital Status: Married    Spouse Name: N/A    Number of Children: N/A  . Years of Education: N/A   Occupational History  . Not on file.   Social History Main Topics  . Smoking status: Never Smoker   . Smokeless tobacco: Never Used  . Alcohol Use: 0.6 oz/week    1 Glasses of wine per week  . Drug Use: No  . Sexual Activity: Yes   Other Topics Concern  . Not on file   Social History Narrative  . No narrative on file    Socially she is married. There is no tobacco use. She drinks alcohol rarely.  History reviewed. No pertinent family history.  ROS is negative for fevers, chills or night sweats.  She will be participating in cardiac rehabilitation. Her blood pressure has been low and she had developed fatigue. This has improved following discontinuance of her carvedilol. Her cough has improved with discontinuance of lisinopril. She denies wheezing. She is unaware of tachycardia palpitations.  She recently was found to have hematuria. She denies abdominal pain. She denies leg swelling. She denies myalgias. Other system review is negative.  PE BP 124/82  Pulse 62  Ht 5\' 5"  (1.651 m)  Wt 216 lb 12.8 oz (98.34 kg)  BMI 36.08 kg/m2  General: Alert, oriented, no distress.  Skin: normal turgor, no rashes HEENT: Normocephalic, atraumatic. Pupils round and reactive; sclera anicteric;no lid lag.  Nose without nasal septal hypertrophy Mouth/Parynx benign; Mallinpatti scale 3 Neck: No JVD, no carotid briuts Lungs: clear to ausculatation and percussion; no wheezing or rales Heart: RRR, s1 s2 normal 1/6 systolic Abdomen: soft, nontender; no hepatosplenomehaly, BS+; abdominal aorta nontender and not dilated by palpation. Pulses 2+ Extremities: no clubbing cyanosis or edema, Homan's sign negative  Neurologic: grossly nonfocal   LABS:  BMET    Component Value Date/Time   NA 141  12/03/2012 1058   K 3.7 12/03/2012 1058   CL 104 12/03/2012 1058   CO2 27 12/03/2012 1058   GLUCOSE 97 12/03/2012 1058   BUN 21 12/03/2012 1058   CREATININE 0.93 12/03/2012 1058   CREATININE 0.91 10/24/2012 0428   CALCIUM 10.1 12/03/2012 1058   GFRNONAA 60* 10/24/2012 0428   GFRAA 70* 10/24/2012 0428     Hepatic Function Panel     Component Value Date/Time   PROT 6.9 12/03/2012 1058   ALBUMIN 4.0 12/03/2012 1058   AST 23 12/03/2012 1058   ALT 12 12/03/2012 1058   ALKPHOS 93 12/03/2012 1058   BILITOT 0.6 12/03/2012 1058     CBC    Component Value Date/Time   WBC 7.4 12/03/2012 1058   RBC 4.37 12/03/2012 1058   HGB 13.2 12/03/2012 1058  HCT 38.2 12/03/2012 1058   PLT 326 12/03/2012 1058   MCV 87.4 12/03/2012 1058   MCH 30.2 12/03/2012 1058   MCHC 34.6 12/03/2012 1058   RDW 13.8 12/03/2012 1058     BNP    Component Value Date/Time   PROBNP 1918.0* 10/23/2012 0420    Lipid Panel     Component Value Date/Time   CHOL 170 10/20/2012 0420   TRIG 119 12/03/2012 1058   TRIG 202* 10/20/2012 0420   HDL 40 10/20/2012 0420   CHOLHDL 4.3 10/20/2012 0420   VLDL 40 10/20/2012 0420   LDLCALC 90 12/03/2012 1058   LDLCALC 90 10/20/2012 0420     RADIOLOGY: No results found.    ASSESSMENT AND PLAN: [name] is a 78 year old female who presented to [facility] on 10/19/2012 after an approximate 12 hour delay from back and chest discomfort. When she arrived to [facility] she had excellent door to balloon time at 27 minutes in the setting of an anterior wall ST segment elevation myocardial infarction. She underwent successful stenting of her LAD and because of diffuse disease extending posteriorly to tandem DES stents were inserted. 4 days later stage intervention to the circumflex was successful. She did have concomitant 50 - 60% RCA narrowing and a large dominant right coronary artery. She now is tolerating the change to losartan from lisinopril without any further cough. I spent considerable time with her today  reviewing her nuclear perfusion study. This demonstrates significant salvage of myocardium and a majority of the LAD territory but only shows distal apical scar. Post-rest ejection fraction is improved to 66%. She does have DES stents and will require year-long antiplatelet therapy. She has been documented to have left kidney stones which was the reason for her development of hematuria. She is under evaluation for these. She tells me she'll be seeing her primary physician in 2 months. In 4 months I am recommending she undergo a followup NMR lipoprofile since Zetia 10 mg was added to her atorvastatin 80 mg and will also obtain a followup see med at that time. I will see her in the office in followup and further recommendations will be made at that time.    Lennette Bihari, MD, [facility]  12/19/2012 2:57 PM

## 2012-12-19 NOTE — Patient Instructions (Signed)
Your physician recommends that you return for lab work in:  January . Your physician recommends that you schedule a follow-up appointment in: January or February

## 2012-12-24 ENCOUNTER — Telehealth: Payer: Self-pay | Admitting: Cardiovascular Disease

## 2012-12-24 ENCOUNTER — Encounter: Payer: Self-pay | Admitting: *Deleted

## 2012-12-24 NOTE — Telephone Encounter (Signed)
Note faxed to Lonie [facility] @ [phone] okay to teach water aerobics.

## 2012-12-24 NOTE — Telephone Encounter (Signed)
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

## 2012-12-24 NOTE — Telephone Encounter (Signed)
Message forwarded to Dr. Kelly/Wanda, CMA.  

## 2012-12-25 ENCOUNTER — Encounter: Payer: Self-pay | Admitting: Cardiovascular Disease

## 2013-01-14 ENCOUNTER — Ambulatory Visit: Payer: Medicare Other | Admitting: Pharmacist Clinician (PhC)/ Clinical Pharmacy Specialist

## 2013-01-25 ENCOUNTER — Encounter: Payer: Self-pay | Admitting: Cardiovascular Disease

## 2013-01-30 ENCOUNTER — Other Ambulatory Visit: Payer: Self-pay

## 2013-02-24 ENCOUNTER — Encounter: Payer: Self-pay | Admitting: Cardiovascular Disease

## 2013-03-18 ENCOUNTER — Other Ambulatory Visit: Payer: Self-pay | Admitting: *Deleted

## 2013-03-18 ENCOUNTER — Encounter: Payer: Self-pay | Admitting: *Deleted

## 2013-03-18 DIAGNOSIS — E785 Hyperlipidemia, unspecified: Secondary | ICD-10-CM

## 2013-03-18 DIAGNOSIS — Z79899 Other long term (current) drug therapy: Secondary | ICD-10-CM

## 2013-03-27 ENCOUNTER — Encounter: Payer: Self-pay | Admitting: Cardiovascular Disease

## 2013-04-07 ENCOUNTER — Ambulatory Visit: Payer: Self-pay | Admitting: Family Medicine

## 2013-04-16 LAB — COMPREHENSIVE METABOLIC PANEL
ALT: 26 U/L (ref 0–35)
AST: 36 U/L (ref 0–37)
Albumin: 4.2 g/dL (ref 3.5–5.2)
Alkaline Phosphatase: 110 U/L (ref 39–117)
BUN: 18 mg/dL (ref 6–23)
CO2: 29 mEq/L (ref 19–32)
Calcium: 9.6 mg/dL (ref 8.4–10.5)
Chloride: 106 mEq/L (ref 96–112)
Creat: 0.89 mg/dL (ref 0.50–1.10)
Glucose, Bld: 84 mg/dL (ref 70–99)
Potassium: 4.3 mEq/L (ref 3.5–5.3)
Sodium: 140 mEq/L (ref 135–145)
Total Bilirubin: 0.6 mg/dL (ref 0.3–1.2)
Total Protein: 6.5 g/dL (ref 6.0–8.3)

## 2013-04-17 LAB — NMR LIPOPROFILE WITH LIPIDS
Cholesterol, Total: 141 mg/dL (ref <200)
HDL Particle Number: 33.4 umol/L (ref >=30.5)
HDL Size: 9.1 nm — ABNORMAL LOW (ref >=9.2)
HDL-C: 53 mg/dL (ref >=40)
LDL (calc): 66 mg/dL (ref <100)
LDL Particle Number: 849 nmol/L (ref <1000)
LDL Size: 20.6 nm (ref >20.5)
LP-IR Score: <25 (ref <=45)
Large HDL-P: 7.6 umol/L (ref >=4.8)
Large VLDL-P: 0.9 nmol/L (ref <=2.7)
Small LDL Particle Number: 265 nmol/L (ref <=527)
Triglycerides: 112 mg/dL (ref <150)
VLDL Size: 41.6 nm (ref <=46.6)

## 2013-04-21 ENCOUNTER — Encounter: Payer: Self-pay | Admitting: Cardiovascular Disease

## 2013-04-21 ENCOUNTER — Ambulatory Visit (INDEPENDENT_AMBULATORY_CARE_PROVIDER_SITE_OTHER): Payer: Medicare Other | Admitting: Cardiovascular Disease

## 2013-04-21 VITALS — BP 130/84 | HR 55 | Ht 64.5 in | Wt 211.0 lb

## 2013-04-21 DIAGNOSIS — I2109 ST elevation (STEMI) myocardial infarction involving other coronary artery of anterior wall: Secondary | ICD-10-CM

## 2013-04-21 DIAGNOSIS — R319 Hematuria, unspecified: Secondary | ICD-10-CM

## 2013-04-21 DIAGNOSIS — E785 Hyperlipidemia, unspecified: Secondary | ICD-10-CM

## 2013-04-21 DIAGNOSIS — I2510 Atherosclerotic heart disease of native coronary artery without angina pectoris: Secondary | ICD-10-CM

## 2013-04-21 DIAGNOSIS — I10 Essential (primary) hypertension: Secondary | ICD-10-CM

## 2013-04-21 DIAGNOSIS — I2102 ST elevation (STEMI) myocardial infarction involving left anterior descending coronary artery: Secondary | ICD-10-CM

## 2013-04-21 MED ORDER — LOSARTAN POTASSIUM 50 MG PO TABS: 50.0000 mg | ORAL_TABLET | Freq: Every day | ORAL | Status: DC

## 2013-04-21 NOTE — Progress Notes (Signed)
Patient ID: Susan [name], female   DOB: Mar 15, 1935, 79 y.o.   MRN: [id number]      HPI: Susan [name], is a 79 y.o. female who presents to the office today for a 4 month cardiology evaluation following Susan [name] ST segment elevation MI with urgent intervention to Susan [name] LAD and subsequent staged procedure to Susan [name] left circumflex vessel.  Susan [name] is a 79 year old female who is originally from [place] and now resides in [place]. On 10/19/2012 Susan [name] presented [place] in transfer from [facility] with an anterior wall ST segment elevation myocardial infarction. Susan [name] did have chest pain of greater than 12 hours duration. I performed emergent cardiac catheterization  where Susan [name] LAD was found to be totally occluded.  Susan [name] underwent stenting of Susan [name] LAD extending from the ostium to mid region with 3.0x38 mm and 3.0x16 mm Promus premier DES stents. Initially Susan [name] did have significant wall motion abnormality involving the LAD territory. Susan [name] also was found to have a 95% mid AV groove circumflex stenosis. Susan [name] now significant ECG changes and also experienced recurrent episodes of back discomfort several days later. On 10/23/2012 Susan [name] underwent successful intervention with insertion of a 2.25x12 mm Promus premier DES stent into the circumflex vessel. Subsequently Susan [name] had done well. Susan [name] did develop some hypotension leading to reduction in some of Susan [name] medications. Susan [name] was enrolled in the [facility] cardiac rehabilitation program. An exercise Myoview study on 12/03/2012 demonstrated significant salvage of myocardium with only a small area of distal antero-apical, apical, septal and  apical scar without evidence for ischemia. Post-rest ejection fraction was 66% and there was evidence for apical akinesis. Did obtain subsequent laboratory iron current medical regimen and LDL particle number was still increased at 1352 despite being on atorvastatin 80 mg. Susan [name] was contacted and Zetia 10 mg was just added to this  atorvastatin dose. Susan [name] did have 735 small LDL particles and a calculated LDL of 90 with HDL 40 and triglycerides 119.  Susan [name] had developed hematuria and was found to have left kidney stones following several test. Susan [name] is maintaining Susan [name] current dose of Alimta. Susan [name] denies chest pain. Susan [name] is doing water aerobics and participating in cardiac rehabilitation.  As the aggressively treated with lipid-lowering therapy. Susan [name] recently underwent a followup blood work which showed marked improvement in Susan [name] lipid status. Susan [name] had 849 LDL particle number was improved from 1352. Calculated LDL was 66. Triglycerides were excellent at 112, HDL particle number excellent at 33.4. Susan [name] insulin resistance score was excellent.  Past Medical History  Diagnosis Date  . Hypertension     History reviewed. No pertinent past surgical history.  Allergies  Allergen Reactions  . Penicillins Rash    Current Outpatient Prescriptions  Medication Sig Dispense Refill  . acetaminophen (TYLENOL) 650 MG CR tablet Take 650 mg by mouth daily.      [name] aspirin EC 81 MG tablet Take 81 mg by mouth daily.      [name] atorvastatin (LIPITOR) 80 MG tablet Take 1 tablet (80 mg total) by mouth daily at 6 PM.  30 tablet  11  . CALCIUM-VITAMIN D PO Take 2 tablets by mouth daily.      . Cholecalciferol (VITAMIN D PO) Take 1 tablet by mouth daily.      [name] ezetimibe (ZETIA) 10 MG tablet Take 1 tablet (10 mg total) by mouth daily.  30 tablet  6  . furosemide (LASIX) 20 MG tablet Take 1 tablet (20 mg total) by mouth daily.  30 tablet  11  . isosorbide mononitrate (IMDUR) 30 MG 24 hr tablet Take 1 tablet (30 mg total) by mouth daily.  30 tablet  11  . losartan (COZAAR) 25 MG tablet Take 1 tablet (25 mg total) by mouth daily.  30 tablet  6  . Multiple Vitamin (MULTIVITAMIN WITH MINERALS) TABS Take 1 tablet by mouth daily.      . nitroGLYCERIN (NITROSTAT) 0.4 MG SL tablet Place 1 tablet (0.4 mg total) under the tongue every 5 (five) minutes x 3 doses as  needed for chest pain.  25 tablet  2  . omeprazole (PRILOSEC) 20 MG capsule Take 20 mg by mouth daily.      . Ticagrelor (BRILINTA) 90 MG TABS tablet Take 1 tablet (90 mg total) by mouth 2 (two) times daily.  60 tablet  11   No current facility-administered medications for this visit.    History   Social History  . Marital Status: Married    Spouse Name: N/A    Number of Children: N/A  . Years of Education: N/A   Occupational History  . Not on file.   Social History Main Topics  . Smoking status: Never Smoker   . Smokeless tobacco: Never Used  . Alcohol Use: 0.6 oz/week    1 Glasses of wine per week  . Drug Use: No  . Sexual Activity: Yes   Other Topics Concern  . Not on file   Social History Narrative  . No narrative on file    Socially Susan [name] is married. There is no tobacco use. Susan [name] drinks alcohol rarely.  History reviewed. No pertinent family history.  ROS is negative for fevers, chills or night sweats.  Susan [name] wears glasses. Susan [name] denies change in vision or hearing. Susan [name] denies change in hearing. Previous fatigue has improved following discontinuance of Susan [name] carvedilol. Susan [name] cough has improved with discontinuance of lisinopril. Susan [name] denies wheezing. Susan [name] is unaware of tachycardia palpitations.  Susan [name] denies recurrent chest pain. Susan [name] denies dyspnea. Susan [name] recently was found to have hematuria. Susan [name] pulled Susan [name] back muscles and trying to lift an anchor out of a boat.. Susan [name] denies abdominal pain. Susan [name] denies leg swelling. Susan [name] denies myalgias. There is no diabetes. There is no tremor. Susan [name] denies claudication. Other comprehensive 14 system review is negative.  PE BP 130/84  Pulse 55  Ht 5' 4.5" (1.638 m)  Wt 211 lb (95.709 kg)  BMI 35.67 kg/m2  Repeat blood pressure by me 140/85. General: Alert, oriented, no distress.  Skin: normal turgor, no rashes HEENT: Normocephalic, atraumatic. Pupils round and reactive; sclera anicteric;no lid lag.  Nose without nasal septal hypertrophy Mouth/Parynx  benign; Mallinpatti scale 3 Neck: No JVD, no carotid briuts; normal upstroke Lungs: clear to ausculatation and percussion; no wheezing or rales Chest wall: No tenderness to palpation Heart: RRR, s1 s2 normal 1/6 systolic murmur, no S3 or S4 gallop. Abdomen: soft, nontender; no hepatosplenomehaly, BS+; abdominal aorta nontender and not dilated by palpation. Back: No CVA tenderness Pulses 2+ Extremities: no clubbing cyanosis or edema, Homan's sign negative  Neurologic: grossly nonfocal Psychological: Normal affect and mood  EKG (independently read by me): Sinus rhythm at 55 beats per minute. First degree AV block. Poor progression compatible with Susan [name] prior septal infarction. T-wave changes anterolaterally.  LABS:  BMET    Component Value Date/Time   NA 140 04/16/2013 0920   K 4.3 04/16/2013 0920   CL 106 04/16/2013 0920   CO2 29 04/16/2013 0920   GLUCOSE 84 04/16/2013 0920  BUN 18 04/16/2013 0920   CREATININE 0.89 04/16/2013 0920   CREATININE 0.91 10/24/2012 0428   CALCIUM 9.6 04/16/2013 0920   GFRNONAA 60* 10/24/2012 0428   GFRAA 70* 10/24/2012 0428     Hepatic Function Panel     Component Value Date/Time   PROT 6.5 04/16/2013 0920   ALBUMIN 4.2 04/16/2013 0920   AST 36 04/16/2013 0920   ALT 26 04/16/2013 0920   ALKPHOS 110 04/16/2013 0920   BILITOT 0.6 04/16/2013 0920     CBC    Component Value Date/Time   WBC 7.4 12/03/2012 1058   RBC 4.37 12/03/2012 1058   HGB 13.2 12/03/2012 1058   HCT 38.2 12/03/2012 1058   PLT 326 12/03/2012 1058   MCV 87.4 12/03/2012 1058   MCH 30.2 12/03/2012 1058   MCHC 34.6 12/03/2012 1058   RDW 13.8 12/03/2012 1058     BNP    Component Value Date/Time   PROBNP 1918.0* 10/23/2012 0420    Lipid Panel     Component Value Date/Time   CHOL 170 10/20/2012 0420   TRIG 112 04/16/2013 0924   TRIG 202* 10/20/2012 0420   HDL 40 10/20/2012 0420   CHOLHDL 4.3 10/20/2012 0420   VLDL 40 10/20/2012 0420   LDLCALC 66 04/16/2013 0924   LDLCALC 90 10/20/2012 0420      RADIOLOGY: No results found.    ASSESSMENT AND PLAN: Susan [name] is a 79 year old female who presented to [facility] on 10/19/2012 after an approximate 12 hour delay from back and chest discomfort. When Susan [name] arrived to [facility] Susan [name] had excellent door to balloon time at 27 minutes in the setting of an anterior wall ST segment elevation myocardial infarction. Susan [name] underwent successful stenting of Susan [name] LAD and because of diffuse disease extending posteriorly to tandem DES stents were inserted. 4 days later stage intervention to the circumflex was successful. Susan [name] did have concomitant 50 - 60% RCA narrowing and a large dominant right coronary artery. Susan [name] now is tolerating the change to losartan from lisinopril without any further cough. Susan [name] nuclear perfusion study demonstrates significant salvage of myocardium and a majority of the LAD territory but only shows distal apical scar. Post-rest ejection fraction is improved to 66%. Susan [name] does have DES stents and will require year-long antiplatelet therapy. Susan [name] has been documented to have left kidney stones which was the reason for Susan [name] development of hematuria. Susan [name] currently has complaints of frequent urination following the furosemide. Susan [name] denies significant leg swelling. I will attempt to discontinue the furosemide but we'll further titrate Susan [name] losartan to 50 mg daily. I also reviewed with Susan [name] most recent laboratory. Susan [name] is aggressively treated for Susan [name] lipids on a twist and 80 mg and Zetia 10 mg. LDL particle number is excellent. We again discussed exercise. Susan [name] teaches water aerobics. Susan [name] will try to also improve some of Susan [name] aerobic capacity. Susan [name] is tolerating the combination titanic over 90 twice a day and aspirin 81 mg. I recommended that Susan [name] not hold this medicine for a minimum of a year or. Susan [name] was contemplating having to potentially stop this for aggressive dental cleaning but I advised Susan [name] against this. I also discussed the most recent DAPP  trial and potential benefit with long-term antiplatelet therapy. I will see Susan [name] in 6 months for followup evaluation.    Troy Sine, MD, [facility]  04/21/2013 12:12 PM

## 2013-04-21 NOTE — Patient Instructions (Signed)
Your physician has recommended you make the following change in your medication: increase the losartan from 25 mg to 50 mg. This has already been sent to the pharmacy.  Take the fluid pill ( furosemide) as needed.  Your physician recommends that you schedule a follow-up appointment in:6 MONTHS.

## 2013-04-27 ENCOUNTER — Encounter: Payer: Self-pay | Admitting: Cardiovascular Disease

## 2013-05-25 ENCOUNTER — Encounter: Payer: Self-pay | Admitting: Cardiovascular Disease

## 2013-06-13 ENCOUNTER — Other Ambulatory Visit: Payer: Self-pay

## 2013-06-13 MED ORDER — LOSARTAN POTASSIUM 50 MG PO TABS: 50.0000 mg | ORAL_TABLET | Freq: Every day | ORAL | Status: DC

## 2013-06-13 NOTE — Telephone Encounter (Signed)
Rx was sent to pharmacy electronically. 

## 2013-06-19 ENCOUNTER — Other Ambulatory Visit: Payer: Self-pay | Admitting: Cardiology

## 2013-06-19 NOTE — Telephone Encounter (Signed)
Rx was sent to pharmacy electronically. 

## 2013-06-30 DIAGNOSIS — R609 Edema, unspecified: Secondary | ICD-10-CM | POA: Insufficient documentation

## 2013-06-30 DIAGNOSIS — C4491 Basal cell carcinoma of skin, unspecified: Secondary | ICD-10-CM | POA: Insufficient documentation

## 2013-06-30 DIAGNOSIS — M48061 Spinal stenosis, lumbar region without neurogenic claudication: Secondary | ICD-10-CM | POA: Insufficient documentation

## 2013-06-30 DIAGNOSIS — M1990 Unspecified osteoarthritis, unspecified site: Secondary | ICD-10-CM | POA: Insufficient documentation

## 2013-06-30 DIAGNOSIS — R011 Cardiac murmur, unspecified: Secondary | ICD-10-CM | POA: Insufficient documentation

## 2013-06-30 DIAGNOSIS — M48062 Spinal stenosis, lumbar region with neurogenic claudication: Secondary | ICD-10-CM | POA: Insufficient documentation

## 2013-06-30 DIAGNOSIS — M5430 Sciatica, unspecified side: Secondary | ICD-10-CM | POA: Insufficient documentation

## 2013-06-30 DIAGNOSIS — M9979 Connective tissue and disc stenosis of intervertebral foramina of abdomen and other regions: Secondary | ICD-10-CM | POA: Insufficient documentation

## 2013-08-01 ENCOUNTER — Telehealth: Payer: Self-pay | Admitting: Cardiovascular Disease

## 2013-08-05 ENCOUNTER — Other Ambulatory Visit: Payer: Self-pay | Admitting: *Deleted

## 2013-08-05 ENCOUNTER — Telehealth: Payer: Self-pay | Admitting: Cardiovascular Disease

## 2013-08-05 ENCOUNTER — Encounter: Payer: Self-pay | Admitting: *Deleted

## 2013-08-05 DIAGNOSIS — I2510 Atherosclerotic heart disease of native coronary artery without angina pectoris: Secondary | ICD-10-CM

## 2013-08-05 DIAGNOSIS — I10 Essential (primary) hypertension: Secondary | ICD-10-CM

## 2013-08-05 DIAGNOSIS — E785 Hyperlipidemia, unspecified: Secondary | ICD-10-CM

## 2013-08-05 NOTE — Telephone Encounter (Signed)
Labs ordered and slip mailed to patient.

## 2013-08-05 NOTE — Telephone Encounter (Signed)
Will need to ask Dr Evette Georges CMA. ,if labs are needed for appointment LAST SET OF LABS WERE OBTAINED IN 03/2013 NMR/LIPIDS AND CMP. WILL CONTACT PATIENT.

## 2013-08-05 NOTE — Telephone Encounter (Signed)
Is needing a  Lab order mailed to her before her appt on 10/27/2013.[name] Please Call  Thanks

## 2013-08-14 NOTE — Telephone Encounter (Signed)
Closed encounter °

## 2013-09-30 ENCOUNTER — Other Ambulatory Visit: Payer: Self-pay

## 2013-09-30 MED ORDER — FUROSEMIDE 20 MG PO TABS: 20.0000 mg | ORAL_TABLET | Freq: Every day | ORAL | Status: DC

## 2013-09-30 NOTE — Telephone Encounter (Signed)
Rx was sent to pharmacy electronically. 

## 2013-10-10 ENCOUNTER — Other Ambulatory Visit (HOSPITAL_COMMUNITY): Payer: Self-pay | Admitting: Cardiology

## 2013-10-10 NOTE — Telephone Encounter (Signed)
Rx refill sent to patient pharmacy   

## 2013-10-20 LAB — LIPID PANEL
CHOL/HDL RATIO: 2.7 ratio
Cholesterol: 130 mg/dL (ref 0–200)
HDL: 49 mg/dL (ref >39)
LDL CALC: 51 mg/dL (ref 0–99)
TRIGLYCERIDES: 152 mg/dL — AB (ref <150)
VLDL: 30 mg/dL (ref 0–40)

## 2013-10-20 LAB — TSH: TSH: 1.261 u[IU]/mL (ref 0.350–4.500)

## 2013-10-20 LAB — COMPREHENSIVE METABOLIC PANEL
ALBUMIN: 4.1 g/dL (ref 3.5–5.2)
ALK PHOS: 100 U/L (ref 39–117)
ALT: 14 U/L (ref 0–35)
AST: 25 U/L (ref 0–37)
BUN: 21 mg/dL (ref 6–23)
CALCIUM: 9.9 mg/dL (ref 8.4–10.5)
CO2: 31 mEq/L (ref 19–32)
CREATININE: 0.95 mg/dL (ref 0.50–1.10)
Chloride: 104 mEq/L (ref 96–112)
GLUCOSE: 90 mg/dL (ref 70–99)
POTASSIUM: 4.3 meq/L (ref 3.5–5.3)
Sodium: 141 mEq/L (ref 135–145)
Total Bilirubin: 0.6 mg/dL (ref 0.2–1.2)
Total Protein: 6.9 g/dL (ref 6.0–8.3)

## 2013-10-20 LAB — CBC
HEMATOCRIT: 40.3 % (ref 36.0–46.0)
HEMOGLOBIN: 13.4 g/dL (ref 12.0–15.0)
MCH: 29.7 pg (ref 26.0–34.0)
MCHC: 33.3 g/dL (ref 30.0–36.0)
MCV: 89.4 fL (ref 78.0–100.0)
Platelets: 261 10*3/uL (ref 150–400)
RBC: 4.51 MIL/uL (ref 3.87–5.11)
RDW: 13.7 % (ref 11.5–15.5)
WBC: 7 10*3/uL (ref 4.0–10.5)

## 2013-10-27 ENCOUNTER — Encounter: Payer: Self-pay | Admitting: Cardiovascular Disease

## 2013-10-27 ENCOUNTER — Ambulatory Visit (INDEPENDENT_AMBULATORY_CARE_PROVIDER_SITE_OTHER): Payer: Medicare Other | Admitting: Cardiovascular Disease

## 2013-10-27 VITALS — BP 110/70 | HR 60 | Ht 64.5 in | Wt 216.0 lb

## 2013-10-27 DIAGNOSIS — N200 Calculus of kidney: Secondary | ICD-10-CM | POA: Insufficient documentation

## 2013-10-27 DIAGNOSIS — I2109 ST elevation (STEMI) myocardial infarction involving other coronary artery of anterior wall: Secondary | ICD-10-CM

## 2013-10-27 DIAGNOSIS — I10 Essential (primary) hypertension: Secondary | ICD-10-CM

## 2013-10-27 DIAGNOSIS — I2102 ST elevation (STEMI) myocardial infarction involving left anterior descending coronary artery: Secondary | ICD-10-CM

## 2013-10-27 DIAGNOSIS — I2510 Atherosclerotic heart disease of native coronary artery without angina pectoris: Secondary | ICD-10-CM

## 2013-10-27 DIAGNOSIS — E785 Hyperlipidemia, unspecified: Secondary | ICD-10-CM

## 2013-10-27 NOTE — Patient Instructions (Signed)
Your physician has requested that you have en exercise stress myoview. For further information please visit [email or web address]. Please follow instruction sheet, as given.   Your physician recommends that you schedule a follow-up appointment in: 4 weeks

## 2013-10-27 NOTE — Progress Notes (Signed)
Patient ID: Susan [name], female   DOB: 17-Mar-1935, 79 y.o.   MRN: [id number]      HPI: Susan [name] is a 79 y.o. female who presents to the office today for a 7 month cardiology evaluation following her ST segment elevation MI with urgent intervention to her LAD and subsequent staged procedure to her left circumflex vessel.  Susan [name] is a 79 year old female who is originally from [place] and now resides in [place]. On 10/19/2012 she presented [facility] in transfer from [facility] with an anterior wall ST segment elevation myocardial infarction. She did have chest pain of greater than 12 hours duration. I performed emergent cardiac catheterization  where her LAD was found to be totally occluded.  She underwent stenting of her LAD extending from the ostium to mid region with 3.0x38 mm and 3.0x16 mm Promus Premier DES stents. Initially she did have significant wall motion abnormality involving the LAD territory. She also was found to have a 95% mid AV groove circumflex stenosis.  On 10/23/2012 she underwent successful intervention with insertion of a 2.25x12 mm Promus premier DES stent into the circumflex vessel. Subsequently she had done well. She did develop some hypotension leading to reduction in some of her medications. She was enrolled in the [facility] rehabilitation program. An exercise Myoview study on 12/03/2012 demonstrated significant salvage of myocardium with only a small area of distal antero-apical, apical, septal and  apical scar without evidence for ischemia. Post-rest ejection fraction was 66% and there was evidence for apical akinesis. Laboratory revealed an increased LDL particle number at 1352 despite being on atorvastatin 80 mg. She was contacted and Zetia 10 mg was just added to this atorvastatin dose. She did have 735 small LDL particles and a calculated LDL of 90 with HDL 40 and triglycerides 119.  She had developed hematuria and was found to  have left kidney stones following several test. She is maintaining her current dose of Alimta. She denies chest pain. She is doing water aerobics and participated in cardiac rehabilitation.  She tells me that she had 5 kidney stones documented and is has caused intermittent left flank discomfort.  It has now been one year since her myocardial infarction and initiation of Brilinta for dual antiplatelet therapy.  Prior to her MI last year, she did not experience any prodrome of chest pain or shortness of breath.   Past Medical History  Diagnosis Date  . Hypertension     History reviewed. No pertinent past surgical history.  Allergies  Allergen Reactions  . Penicillins Rash    Current Outpatient Prescriptions  Medication Sig Dispense Refill  . acetaminophen (TYLENOL) 500 MG tablet Take 500 mg by mouth every 6 (six) hours as needed.      [name] aspirin EC 81 MG tablet Take 81 mg by mouth daily.      [name] atorvastatin (LIPITOR) 80 MG tablet Take 1 tablet (80 mg total) by mouth daily at 6 PM.  30 tablet  11  . CALCIUM-VITAMIN D PO Take 1 tablet by mouth daily.       . Cholecalciferol (VITAMIN D PO) Take 1,000 mg by mouth daily.       . furosemide (LASIX) 20 MG tablet Take 20 mg by mouth daily.      . isosorbide mononitrate (IMDUR) 30 MG 24 hr tablet TAKE 1 TABLET (30 MG TOTAL) BY MOUTH DAILY.  30 tablet  5  . losartan (COZAAR) 50 MG tablet Take 1 tablet (50  mg total) by mouth daily.  90 tablet  3  . Multiple Vitamin (MULTIVITAMIN WITH MINERALS) TABS Take 1 tablet by mouth daily.      . nitroGLYCERIN (NITROSTAT) 0.4 MG SL tablet Place 1 tablet (0.4 mg total) under the tongue every 5 (five) minutes x 3 doses as needed for chest pain.  25 tablet  2  . omeprazole (PRILOSEC) 20 MG capsule Take 20 mg by mouth daily.      . Ticagrelor (BRILINTA) 90 MG TABS tablet Take 1 tablet (90 mg total) by mouth 2 (two) times daily.  60 tablet  11  . ZETIA 10 MG tablet TAKE 1 TABLET BY MOUTH EVERY DAY  30 tablet  10    No current facility-administered medications for this visit.    History   Social History  . Marital Status: Married    Spouse Name: N/A    Number of Children: N/A  . Years of Education: N/A   Occupational History  . Not on file.   Social History Main Topics  . Smoking status: Never Smoker   . Smokeless tobacco: Never Used  . Alcohol Use: 0.6 oz/week    1 Glasses of wine per week  . Drug Use: No  . Sexual Activity: Yes   Other Topics Concern  . Not on file   Social History Narrative  . No narrative on file    Socially she is married. There is no tobacco use. She drinks alcohol rarely.  History reviewed. No pertinent family history.  ROS General: Negative; No fevers, chills, or night sweats;  HEENT: Negative; No changes in vision or hearing, sinus congestion, difficulty swallowing Pulmonary: Negative; No cough, wheezing, shortness of breath, hemoptysis Cardiovascular: Negative; No chest pain, presyncope, syncope, palpatations GI: Positive for GERD; No nausea, vomiting, diarrhea, or abdominal pain GU: Positive for kidney stones, contributing to left flank pain intermittently; No dysuria, hematuria, or difficulty voiding Musculoskeletal: Negative; no myalgias, joint pain, or weakness Hematologic/Oncology: Negative; no easy bruising, bleeding Endocrine: Negative; no heat/cold intolerance; no diabetes Neuro: Negative; no changes in balance, headaches Skin: Negative; No rashes or skin lesions Psychiatric: Negative; No behavioral problems, depression Sleep: Negative; No snoring, daytime sleepiness, hypersomnolence, bruxism, restless legs, hypnogognic hallucinations, no cataplexy Other comprehensive 14 point system review is negative.   PE BP 110/70  Pulse 60  Ht 5' 4.5" (1.638 m)  Wt 216 lb (97.977 kg)  BMI 36.52 kg/m2  Repeat blood pressure by me 140/85. General: Alert, oriented, no distress.  Skin: normal turgor, no rashes HEENT: Normocephalic, atraumatic.  Pupils round and reactive; sclera anicteric;no lid lag.  Nose without nasal septal hypertrophy Mouth/Parynx benign; Mallinpatti scale 3 Neck: No JVD, no carotid briuts; normal upstroke Lungs: clear to ausculatation and percussion; no wheezing or rales Chest wall: No tenderness to palpation Heart: RRR, s1 s2 normal 1/6 systolic murmur, no S3 or S4 gallop. Abdomen: soft, nontender; no hepatosplenomehaly, BS+; abdominal aorta nontender and not dilated by palpation. Back: No CVA tenderness Pulses 2+ Extremities: no clubbing cyanosis or edema, Homan's sign negative  Neurologic: grossly nonfocal Psychological: Normal affect and mood  ECG (independently read by me): Normal sinus rhythm at 60 beats per minute.  First degree AV block with PR interval at 276 ms.  Old anteroseptal MI with QS complex in V1 and V2.  EKG (independently read by me): Sinus rhythm at 55 beats per minute. First degree AV block. Poor progression compatible with her prior septal infarction. T-wave changes anterolaterally.  LABS:  BMET  Component Value Date/Time   NA 141 10/20/2013 0925   K 4.3 10/20/2013 0925   CL 104 10/20/2013 0925   CO2 31 10/20/2013 0925   GLUCOSE 90 10/20/2013 0925   BUN 21 10/20/2013 0925   CREATININE 0.95 10/20/2013 0925   CREATININE 0.91 10/24/2012 0428   CALCIUM 9.9 10/20/2013 0925   GFRNONAA 60* 10/24/2012 0428   GFRAA 70* 10/24/2012 0428     Hepatic Function Panel     Component Value Date/Time   PROT 6.9 10/20/2013 0925   ALBUMIN 4.1 10/20/2013 0925   AST 25 10/20/2013 0925   ALT 14 10/20/2013 0925   ALKPHOS 100 10/20/2013 0925   BILITOT 0.6 10/20/2013 0925     CBC    Component Value Date/Time   WBC 7.0 10/20/2013 0925   RBC 4.51 10/20/2013 0925   HGB 13.4 10/20/2013 0925   HCT 40.3 10/20/2013 0925   PLT 261 10/20/2013 0925   MCV 89.4 10/20/2013 0925   MCH 29.7 10/20/2013 0925   MCHC 33.3 10/20/2013 0925   RDW 13.7 10/20/2013 0925     BNP    Component Value Date/Time   PROBNP  1918.0* 10/23/2012 0420    Lipid Panel     Component Value Date/Time   CHOL 130 10/20/2013 0925   TRIG 152* 10/20/2013 0925   TRIG 112 04/16/2013 0924   HDL 49 10/20/2013 0925   CHOLHDL 2.7 10/20/2013 0925   VLDL 30 10/20/2013 0925   LDLCALC 51 10/20/2013 0925   LDLCALC 66 04/16/2013 0924     RADIOLOGY: No results found.    ASSESSMENT AND PLAN: Susan [name] is a 79year-old female who presented to [facility] on 10/19/2012 after an approximate 12 hour delay from back and chest discomfort. When she arrived to [facility] she had excellent door to balloon time at 27 minutes in the setting of an anterior wall ST segment elevation myocardial infarction. She underwent successful stenting of her LAD and because of diffuse disease extending posteriorly  tandem DES stents were inserted.  Stage intervention to the circumflex was successful 4 days later.. She did have concomitant 50 - 60% RCA narrowing and a large dominant right coronary artery.  She has a history of ACE inhibitor induced cough, but since he has been taking losartan.  She denies recurrent cough.  A nuclear perfusion study last year demonstrated significant salvage of myocardium and a majority of the LAD territory but only shows distal apical scar. Post-rest ejection fraction is improved to 66%. She does have DES stents and will require year-long antiplatelet therapy. She has been documented to have approximately 5 stones in the left kidney which was the reason for her development of hematuria.  Now that has been a year since her MI.  She presents today to also discuss the possibility of stopping her Brilinta to allow for potential urologic lithotripsy or surgery.  I have recommended that she undergo her one-year followup nuclear perfusion study in light of her concomitant CAD.  If this remains low risk and does not show significant additional ischemia, she will then be able to hold her Brilinta for at least 5 days prior to any  urologic procedure.  I will see her in followup of the above nuclear study and then  arrange followup urologic evaluation and plan for intervention.  Troy Sine, MD, [facility]  10/27/2013 6:51 PM

## 2013-10-31 ENCOUNTER — Telehealth (HOSPITAL_COMMUNITY): Payer: Self-pay

## 2013-10-31 NOTE — Telephone Encounter (Signed)
Encounter complete. 

## 2013-11-03 ENCOUNTER — Other Ambulatory Visit (HOSPITAL_COMMUNITY): Payer: Self-pay | Admitting: Cardiology

## 2013-11-03 NOTE — Telephone Encounter (Signed)
Rx was sent to pharmacy electronically. 

## 2013-11-05 ENCOUNTER — Ambulatory Visit (HOSPITAL_COMMUNITY)
Admission: RE | Admit: 2013-11-05 | Discharge: 2013-11-05 | Disposition: A | Discharge status: Home / Self Care | Payer: Medicare Other | Source: Ambulatory Visit | Attending: Cardiovascular Disease | Admitting: Cardiovascular Disease

## 2013-11-05 DIAGNOSIS — I2589 Other forms of chronic ischemic heart disease: Secondary | ICD-10-CM | POA: Diagnosis not present

## 2013-11-05 DIAGNOSIS — Z9861 Coronary angioplasty status: Secondary | ICD-10-CM | POA: Insufficient documentation

## 2013-11-05 DIAGNOSIS — I2510 Atherosclerotic heart disease of native coronary artery without angina pectoris: Secondary | ICD-10-CM | POA: Diagnosis not present

## 2013-11-05 DIAGNOSIS — I10 Essential (primary) hypertension: Secondary | ICD-10-CM | POA: Insufficient documentation

## 2013-11-05 DIAGNOSIS — Z951 Presence of aortocoronary bypass graft: Secondary | ICD-10-CM | POA: Insufficient documentation

## 2013-11-05 DIAGNOSIS — Z8249 Family history of ischemic heart disease and other diseases of the circulatory system: Secondary | ICD-10-CM | POA: Diagnosis not present

## 2013-11-05 DIAGNOSIS — Z87891 Personal history of nicotine dependence: Secondary | ICD-10-CM | POA: Diagnosis not present

## 2013-11-05 MED ORDER — TECHNETIUM TC 99M SESTAMIBI GENERIC - CARDIOLITE
10.0000 | Freq: Once | INTRAVENOUS | Status: AC | PRN
  Administered 2013-11-05: 10 via INTRAVENOUS

## 2013-11-05 MED ORDER — TECHNETIUM TC 99M SESTAMIBI GENERIC - CARDIOLITE
30.0000 | Freq: Once | INTRAVENOUS | Status: AC | PRN
  Administered 2013-11-05: 30 via INTRAVENOUS

## 2013-11-05 MED ORDER — REGADENOSON 0.4 MG/5ML IV SOLN
0.4000 mg | Freq: Once | INTRAVENOUS | Status: AC
  Administered 2013-11-05: 0.4 mg via INTRAVENOUS

## 2013-11-05 NOTE — Procedures (Addendum)
[facility] [address] [place] [address] [phone]  Cardiology Nuclear Med Study  Susan [name] is a 79 y.o. female     MRN : [id number]     DOB: 1934-05-13  Procedure Date: 11/05/2013  Nuclear Med Background Indication for Stress Test:  Evaluation for Ischemia, Stent Patency and Abnormal EKG History:  CAD;MI-10/13/2012;STENT/PTCA-10/24/2012;Ischemic cardiomyopathy;NSVT;Bradycardia;Last NUC MPI on 12/03/2012-nonischemic;EF+66% Cardiac Risk Factors: Family History - CAD, History of Smoking, Hypertension, Lipids and Obesity  Symptoms:  Pt denies symptoms.   Nuclear Pre-Procedure Caffeine/Decaff Intake:  7:00pm NPO After: 5:00am   IV Site: R Hand  IV 0.9% NS with Angio Cath:  22g  Chest Size (in):  n/a IV Started by: Rolene Course, RN  Height: 5\' 5"  (1.651 m)  Cup Size: C  BMI:  Body mass index is 35.94 kg/(m^2). Weight:  216 lb (97.977 kg)   Tech Comments:  Pt. Couldn't walk treadmill due to back pain; changed to [employer] Med Study 1 or 2 day study: 1 day  Stress Test Type:  Stress  Order Authorizing Provider:  Shelva Majestic, MD   Resting Radionuclide: Technetium 30m Sestamibi  Resting Radionuclide Dose: 10.5 mCi   Stress Radionuclide:  Technetium 48m Sestamibi  Stress Radionuclide Dose: 29.8 mCi           Stress Protocol Rest HR: 48 Stress HR: 54  Rest BP: 143/75 Stress BP: 145/71  Exercise Time (min): n/a METS: n/a   Predicted Max HR: 144 bpm % Max HR: 44.44 bpm Rate Pressure Product: 9280  Dose of Adenosine (mg):  n/a Dose of Lexiscan: 0.4 mg  Dose of Atropine (mg): n/a Dose of Dobutamine: n/a mcg/kg/min (at max HR)  Stress Test Technologist: Leane Para, CCT Nuclear Technologist: Otho Perl, CNMT   Rest Procedure:  Myocardial perfusion imaging was performed at rest 45 minutes following the intravenous administration of Technetium 44m Sestamibi. Stress Procedure:  The patient  received IV Lexiscan 0.4 mg over 15-seconds.  Technetium 87m Sestamibi injected IV at 30-seconds.  There were no significant changes with Lexiscan.  Quantitative spect images were obtained after a 45 minute delay.  Transient Ischemic Dilatation (Normal <1.22):  1.05 QGS EDV:  136 ml QGS ESV:  71 ml LV Ejection Fraction: 48%     Rest ECG: NSR, old anteroseptal infarction  Stress ECG: No significant change from baseline ECG  QPS Raw Data Images:  Normal; no motion artifact; normal heart/lung ratio. Stress Images:  There is an extensive and severe reduction intracer uptake involving the entire apex, and the mid-distal inferior wall Rest Images:  Comparison with the stress images reveals no significant change. Subtraction (SDS):  There is a fixed defect that is most consistent with a previous infarction. LV Wall Motion:  Apical akinesis/mild dyskinesis. Mildly reduced overall LV systolic function.  Impression Exercise Capacity:  Lexiscan with no exercise. BP Response:  Normal blood pressure response. Clinical Symptoms:  No significant symptoms noted. ECG Impression:  No significant ST segment change suggestive of ischemia. Comparison with Prior Nuclear Study: No significant change in the perfusion pattern from previous study. EF is lower.   Overall Impression:  Low risk stress nuclear study with an extensive scar in the distal LAD artery distribution and mildly depressed LVEF.[name]   Sanda Klein, MD  11/05/2013 1:15 PM

## 2013-11-07 ENCOUNTER — Encounter: Payer: Self-pay | Admitting: Cardiovascular Disease

## 2013-12-02 ENCOUNTER — Ambulatory Visit (INDEPENDENT_AMBULATORY_CARE_PROVIDER_SITE_OTHER): Payer: Medicare Other | Admitting: Cardiovascular Disease

## 2013-12-02 VITALS — BP 137/76 | HR 58 | Ht 64.0 in | Wt 214.9 lb

## 2013-12-02 DIAGNOSIS — I255 Ischemic cardiomyopathy: Secondary | ICD-10-CM

## 2013-12-02 DIAGNOSIS — N200 Calculus of kidney: Secondary | ICD-10-CM

## 2013-12-02 DIAGNOSIS — I2510 Atherosclerotic heart disease of native coronary artery without angina pectoris: Secondary | ICD-10-CM

## 2013-12-02 DIAGNOSIS — I10 Essential (primary) hypertension: Secondary | ICD-10-CM

## 2013-12-02 DIAGNOSIS — E785 Hyperlipidemia, unspecified: Secondary | ICD-10-CM

## 2013-12-02 DIAGNOSIS — I2589 Other forms of chronic ischemic heart disease: Secondary | ICD-10-CM

## 2013-12-02 NOTE — Patient Instructions (Signed)
Your physician wants you to follow-up in: 6 months. You will receive a reminder letter in the mail two months in advance. If you don't receive a letter, please call our office to schedule the follow-up appointment. No changes were made today in your therapy.  

## 2013-12-03 ENCOUNTER — Encounter: Payer: Self-pay | Admitting: Cardiovascular Disease

## 2013-12-03 NOTE — Progress Notes (Signed)
Patient ID: Susan [name], female   DOB: Jun 29, 1934, 79 y.o.   MRN: [id number]       HPI: Susan [name] is a 79 y.o. female who presents to the office today for follow-up cardiology evaluation following her recent following her myocardial perfusion study.   Susan [name] is a 79 year old female who is originally from [place] and now resides in [place]. On 10/19/2012 she presented [facility] in transfer from [facility] with an anterior wall ST segment elevation myocardial infarction. She did have chest pain of greater than 12 hours duration. I performed emergent cardiac catheterization  where her LAD was found to be totally occluded.  She underwent stenting of her LAD extending from the ostium to mid region with 3.0x38 mm and 3.0x16 mm Promus Premier DES stents. Initially she did have significant wall motion abnormality involving the LAD territory. She also was found to have a 95% mid AV groove circumflex stenosis.  On 10/23/2012 she underwent successful intervention with insertion of a 2.25x12 mm Promus premier DES stent into the circumflex vessel. Subsequently she had done well. She did develop some hypotension leading to reduction in some of her medications. She was enrolled in the [place] cardiac rehabilitation program. An exercise Myoview study on 12/03/2012 demonstrated significant salvage of myocardium with only a small area of distal antero-apical, apical, septal and  apical scar without evidence for ischemia. Post-rest ejection fraction was 66% and there was evidence for apical akinesis. Laboratory revealed an increased LDL particle number at 1352 despite being on atorvastatin 80 mg. She was contacted and Zetia 10 mg was just added to this atorvastatin dose. She did have 735 small LDL particles and a calculated LDL of 90 with HDL 40 and triglycerides 119.  She had developed hematuria and was found to have left kidney stones following several test. She is maintaining her  current dose of Alimta. She denies chest pain. She is doing water aerobics and participated in cardiac rehabilitation.  She tells me that she had 5 kidney stones documented and is has caused intermittent left flank discomfort.  It has now been slightly greater than one year since her myocardial infarction and initiation of Brilinta for dual antiplatelet therapy.  Prior to her MI last year, she did not experience any prodrome of chest pain or shortness of breath.  When I saw 5 weeks ago, she told me that she did undergo lithotripsy for her multiple kidney stones.  I scheduled her for a nuclear perfusion study prior to potential discontinuance of dual anti-platelet therapy.  This was done on 11/05/2013 this was interpreted as low risk, but did show extensive scar in the distal LAD to return distribution.  Post-rest ejection fraction was 40%.  There was evidence for apical akinesis/mild dyskinesis.  Susan [name], denies any recurrent chest pain, as well as exertional dyspnea.  She scheduled to see the urologist this week to followup urologic studies to assess her kidney stones.  She presents for evaluation.   Past Medical History  Diagnosis Date  . Hypertension     History reviewed. No pertinent past surgical history.  Allergies  Allergen Reactions  . Penicillins Rash    Current Outpatient Prescriptions  Medication Sig Dispense Refill  . acetaminophen (TYLENOL) 500 MG tablet Take 500 mg by mouth every 6 (six) hours as needed.      [name] aspirin EC 81 MG tablet Take 81 mg by mouth daily.      [name] atorvastatin (LIPITOR) 80 MG tablet Take  1 tablet (80 mg total) by mouth daily at 6 PM.  30 tablet  11  . BRILINTA 90 MG TABS tablet TAKE 1 TABLET (90 MG TOTAL) BY MOUTH 2 (TWO) TIMES DAILY.  60 tablet  11  . CALCIUM-VITAMIN D PO Take 1 tablet by mouth daily.       . Cholecalciferol (VITAMIN D PO) Take 1,000 mg by mouth daily.       . furosemide (LASIX) 20 MG tablet Take 20 mg by mouth daily.      .  isosorbide mononitrate (IMDUR) 30 MG 24 hr tablet TAKE 1 TABLET (30 MG TOTAL) BY MOUTH DAILY.  30 tablet  5  . losartan (COZAAR) 50 MG tablet Take 1 tablet (50 mg total) by mouth daily.  90 tablet  3  . Multiple Vitamin (MULTIVITAMIN WITH MINERALS) TABS Take 1 tablet by mouth daily.      . nitroGLYCERIN (NITROSTAT) 0.4 MG SL tablet Place 1 tablet (0.4 mg total) under the tongue every 5 (five) minutes x 3 doses as needed for chest pain.  25 tablet  2  . omeprazole (PRILOSEC) 20 MG capsule Take 20 mg by mouth daily.      [name] ZETIA 10 MG tablet TAKE 1 TABLET BY MOUTH EVERY DAY  30 tablet  10   No current facility-administered medications for this visit.    History   Social History  . Marital Status: Married    Spouse Name: N/A    Number of Children: N/A  . Years of Education: N/A   Occupational History  . Not on file.   Social History Main Topics  . Smoking status: Never Smoker   . Smokeless tobacco: Never Used  . Alcohol Use: 0.6 oz/week    1 Glasses of wine per week  . Drug Use: No  . Sexual Activity: Yes   Other Topics Concern  . Not on file   Social History Narrative  . No narrative on file    Socially she is married. There is no tobacco use. She drinks alcohol rarely.  History reviewed. No pertinent family history.  ROS General: Negative; No fevers, chills, or night sweats;  HEENT: Negative; No changes in vision or hearing, sinus congestion, difficulty swallowing Pulmonary: Negative; No cough, wheezing, shortness of breath, hemoptysis Cardiovascular: Negative; No chest pain, presyncope, syncope, palpatations GI: Positive for GERD; No nausea, vomiting, diarrhea, or abdominal pain GU: Positive for kidney stones, contributing to left flank pain intermittently; No dysuria, hematuria, or difficulty voiding Musculoskeletal: Negative; no myalgias, joint pain, or weakness Hematologic/Oncology: Negative; no easy bruising, bleeding Endocrine: Negative; no heat/cold intolerance;  no diabetes Neuro: Negative; no changes in balance, headaches Skin: Negative; No rashes or skin lesions Psychiatric: Negative; No behavioral problems, depression Sleep: Negative; No snoring, daytime sleepiness, hypersomnolence, bruxism, restless legs, hypnogognic hallucinations, no cataplexy Other comprehensive 14 point system review is negative.   PE BP 137/76  Pulse 58  Ht 5\' 4"  (1.626 m)  Wt 214 lb 14.4 oz (97.478 kg)  BMI 36.87 kg/m2  Repeat blood pressure by me 140/85. General: Alert, oriented, no distress.  Skin: normal turgor, no rashes HEENT: Normocephalic, atraumatic. Pupils round and reactive; sclera anicteric;no lid lag.  Nose without nasal septal hypertrophy Mouth/Parynx benign; Mallinpatti scale 3 Neck: No JVD, no carotid briuts; normal upstroke Lungs: clear to ausculatation and percussion; no wheezing or rales Chest wall: No tenderness to palpation Heart: RRR, s1 s2 normal 1/6 systolic murmur, no S3 or S4 gallop. Abdomen: soft, nontender; no  hepatosplenomehaly, BS+; abdominal aorta nontender and not dilated by palpation. Back: No CVA tenderness Pulses 2+ Extremities: no clubbing cyanosis or edema, Homan's sign negative  Neurologic: grossly nonfocal Psychological: Normal affect and mood  October 27, 2013 ECG (independently read by me): Normal sinus rhythm at 60 beats per minute.  First degree AV block with PR interval at 276 ms.  Old anteroseptal MI with QS complex in V1 and V2.  EKG (independently read by me): Sinus rhythm at 55 beats per minute. First degree AV block. Poor progression compatible with her prior septal infarction. T-wave changes anterolaterally.  LABS:  BMET    Component Value Date/Time   NA 141 10/20/2013 0925   K 4.3 10/20/2013 0925   CL 104 10/20/2013 0925   CO2 31 10/20/2013 0925   GLUCOSE 90 10/20/2013 0925   BUN 21 10/20/2013 0925   CREATININE 0.95 10/20/2013 0925   CREATININE 0.91 10/24/2012 0428   CALCIUM 9.9 10/20/2013 0925   GFRNONAA 60*  10/24/2012 0428   GFRAA 70* 10/24/2012 0428     Hepatic Function Panel     Component Value Date/Time   PROT 6.9 10/20/2013 0925   ALBUMIN 4.1 10/20/2013 0925   AST 25 10/20/2013 0925   ALT 14 10/20/2013 0925   ALKPHOS 100 10/20/2013 0925   BILITOT 0.6 10/20/2013 0925     CBC    Component Value Date/Time   WBC 7.0 10/20/2013 0925   RBC 4.51 10/20/2013 0925   HGB 13.4 10/20/2013 0925   HCT 40.3 10/20/2013 0925   PLT 261 10/20/2013 0925   MCV 89.4 10/20/2013 0925   MCH 29.7 10/20/2013 0925   MCHC 33.3 10/20/2013 0925   RDW 13.7 10/20/2013 0925     BNP    Component Value Date/Time   PROBNP 1918.0* 10/23/2012 0420    Lipid Panel     Component Value Date/Time   CHOL 130 10/20/2013 0925   TRIG 152* 10/20/2013 0925   TRIG 112 04/16/2013 0924   HDL 49 10/20/2013 0925   CHOLHDL 2.7 10/20/2013 0925   VLDL 30 10/20/2013 0925   LDLCALC 51 10/20/2013 0925   LDLCALC 66 04/16/2013 0924     RADIOLOGY: No results found.    ASSESSMENT AND PLAN: Susan [name] is a 79year-old female who presented to [facility] on 10/19/2012 after an approximate 12 hour delay from back and chest discomfort. When she arrived to [facility] she had excellent door to balloon time at 27 minutes in the setting of an anterior wall ST segment elevation myocardial infarction. She underwent successful stenting of her LAD and because of diffuse disease extending posteriorly  tandem DES stents were inserted.  Stage intervention to the circumflex was successful 4 days later.. She did have concomitant 50 - 60% RCA narrowing and a large dominant right coronary artery.  She has a history of ACE inhibitor induced cough, but since he has been taking losartan.  She denies recurrent cough.  A nuclear perfusion study last year demonstrated significant salvage of myocardium and a majority of the LAD territory and showed distal apical scar. Post-rest ejection fraction is improved to 66%.  She has DES stents in place and completed one  year of 2 and a platelet therapy.  Her most recent nuclear perfusion study again demonstrates the region of the distal LAD territory scar.  Her most recent valuation, post-rest ejection fraction was 48%.  His Holliday is presently asymptomatic from a cardiovascular standpoint.  She denies any CHF symptomatology.  No  ischemia was demonstrated.  Her most recent study argue against significant restenosis.  I am giving her clearance to undergo her urologic procedure.  She will need to hold her aspirin and Brilinta for minimum of 5 days prior to the procedure.  Based on Pegasus trial data , as well as DAPT trial data, I would recommend resumption of dual antiplatelet therapy once she is stable following her urologic procedure.  Troy Sine, MD, [facility]  12/03/2013 7:22 AM

## 2013-12-03 NOTE — Addendum Note (Signed)
Addended by: Shelva Majestic A on: 12/03/2013 07:32 AM   Modules accepted: Level of Service

## 2013-12-08 ENCOUNTER — Telehealth: Payer: Self-pay | Admitting: Cardiovascular Disease

## 2013-12-08 NOTE — Telephone Encounter (Signed)
Message sent to Dr.Kelly for advice. 

## 2013-12-08 NOTE — Telephone Encounter (Signed)
Angie called in stating that Dr. Eliberto Ivory wants to do a lithotripsy on Susan [name] and he would like to know if it is ok for her to come off the aspirin and Brilinta prior to having this done.Please call  Thanks

## 2013-12-09 ENCOUNTER — Encounter: Payer: Self-pay | Admitting: *Deleted

## 2013-12-09 NOTE — Telephone Encounter (Signed)
Per Dr. Evette Georges recent office note okay for patient to have procedure and hold requested medications. Note sent to Dr. Eliberto Ivory.

## 2013-12-11 ENCOUNTER — Other Ambulatory Visit: Payer: Self-pay | Admitting: Cardiovascular Disease

## 2013-12-11 NOTE — Telephone Encounter (Signed)
Rx was sent to pharmacy electronically. 

## 2013-12-17 ENCOUNTER — Ambulatory Visit: Payer: Self-pay | Admitting: Urology

## 2013-12-17 LAB — BASIC METABOLIC PANEL
Anion Gap: 4 — ABNORMAL LOW (ref 7–16)
BUN: 20 mg/dL — AB (ref 7–18)
Calcium, Total: 9.5 mg/dL (ref 8.5–10.1)
Chloride: 105 mmol/L (ref 98–107)
Co2: 32 mmol/L (ref 21–32)
Creatinine: 0.97 mg/dL (ref 0.60–1.30)
EGFR (African American): >60
EGFR (Non-African Amer.): 59 — ABNORMAL LOW
Glucose: 86 mg/dL (ref 65–99)
OSMOLALITY: 283 (ref 275–301)
POTASSIUM: 4.1 mmol/L (ref 3.5–5.1)
Sodium: 141 mmol/L (ref 136–145)

## 2013-12-17 LAB — PROTIME-INR
INR: 1
PROTHROMBIN TIME: 13.3 s (ref 11.5–14.7)

## 2013-12-18 ENCOUNTER — Ambulatory Visit: Payer: Self-pay | Admitting: Urology

## 2014-02-13 DIAGNOSIS — E785 Hyperlipidemia, unspecified: Secondary | ICD-10-CM | POA: Insufficient documentation

## 2014-03-05 ENCOUNTER — Encounter (HOSPITAL_COMMUNITY): Payer: Self-pay | Admitting: Cardiovascular Disease

## 2014-03-18 ENCOUNTER — Other Ambulatory Visit: Payer: Self-pay | Admitting: Cardiovascular Disease

## 2014-03-18 NOTE — Telephone Encounter (Signed)
Rx(s) sent to pharmacy electronically.  

## 2014-04-14 ENCOUNTER — Other Ambulatory Visit: Payer: Self-pay | Admitting: Cardiovascular Disease

## 2014-04-22 ENCOUNTER — Ambulatory Visit: Payer: Self-pay | Admitting: Family Medicine

## 2014-05-27 ENCOUNTER — Telehealth: Payer: Self-pay | Admitting: Cardiovascular Disease

## 2014-05-27 NOTE — Telephone Encounter (Signed)
Spoke with pt, aware she had her cholesterol checked in July. Not sure if need again. Will forward to [place], dr Evette Georges cma

## 2014-05-27 NOTE — Telephone Encounter (Signed)
Returning your call. °

## 2014-05-27 NOTE — Telephone Encounter (Signed)
Left message for pt to call.

## 2014-05-27 NOTE — Telephone Encounter (Signed)
Pt wants to know if she needs lab work before her appointment on Monday with Dr Claiborne Billings.

## 2014-05-28 ENCOUNTER — Other Ambulatory Visit: Payer: Self-pay | Admitting: *Deleted

## 2014-05-28 DIAGNOSIS — Z79899 Other long term (current) drug therapy: Secondary | ICD-10-CM

## 2014-05-28 DIAGNOSIS — I2510 Atherosclerotic heart disease of native coronary artery without angina pectoris: Secondary | ICD-10-CM

## 2014-05-28 DIAGNOSIS — E785 Hyperlipidemia, unspecified: Secondary | ICD-10-CM

## 2014-05-28 DIAGNOSIS — I10 Essential (primary) hypertension: Secondary | ICD-10-CM

## 2014-05-28 NOTE — Telephone Encounter (Signed)
Called patient han informed her that due the medications that she is taking she will need to have blood drawn. Orders placed in the system. Patient will have drawn tomorrow.

## 2014-05-29 LAB — CBC
HCT: 40.8 % (ref 36.0–46.0)
HEMOGLOBIN: 13.4 g/dL (ref 12.0–15.0)
MCH: 30 pg (ref 26.0–34.0)
MCHC: 32.8 g/dL (ref 30.0–36.0)
MCV: 91.3 fL (ref 78.0–100.0)
MPV: 9.7 fL (ref 8.6–12.4)
Platelets: 235 10*3/uL (ref 150–400)
RBC: 4.47 MIL/uL (ref 3.87–5.11)
RDW: 14.1 % (ref 11.5–15.5)
WBC: 6.2 10*3/uL (ref 4.0–10.5)

## 2014-05-29 LAB — COMPREHENSIVE METABOLIC PANEL
ALBUMIN: 4 g/dL (ref 3.5–5.2)
ALT: 13 U/L (ref 0–35)
AST: 22 U/L (ref 0–37)
Alkaline Phosphatase: 94 U/L (ref 39–117)
BILIRUBIN TOTAL: 0.7 mg/dL (ref 0.2–1.2)
BUN: 18 mg/dL (ref 6–23)
CHLORIDE: 107 meq/L (ref 96–112)
CO2: 28 meq/L (ref 19–32)
CREATININE: 0.91 mg/dL (ref 0.50–1.10)
Calcium: 9.7 mg/dL (ref 8.4–10.5)
Glucose, Bld: 83 mg/dL (ref 70–99)
POTASSIUM: 4.5 meq/L (ref 3.5–5.3)
SODIUM: 141 meq/L (ref 135–145)
TOTAL PROTEIN: 6.5 g/dL (ref 6.0–8.3)

## 2014-05-29 LAB — LIPID PANEL
CHOL/HDL RATIO: 2.2 ratio
Cholesterol: 120 mg/dL (ref 0–200)
HDL: 55 mg/dL (ref >=46)
LDL Cholesterol: 42 mg/dL (ref 0–99)
TRIGLYCERIDES: 114 mg/dL (ref <150)
VLDL: 23 mg/dL (ref 0–40)

## 2014-05-30 LAB — TSH: TSH: 1.167 u[IU]/mL (ref 0.350–4.500)

## 2014-06-01 ENCOUNTER — Ambulatory Visit (INDEPENDENT_AMBULATORY_CARE_PROVIDER_SITE_OTHER): Payer: Medicare Other | Admitting: Cardiovascular Disease

## 2014-06-01 ENCOUNTER — Other Ambulatory Visit: Payer: Self-pay | Admitting: Cardiology

## 2014-06-01 VITALS — BP 114/62 | HR 52 | Ht 64.5 in | Wt 213.1 lb

## 2014-06-01 DIAGNOSIS — I255 Ischemic cardiomyopathy: Secondary | ICD-10-CM

## 2014-06-01 DIAGNOSIS — Z79899 Other long term (current) drug therapy: Secondary | ICD-10-CM

## 2014-06-01 DIAGNOSIS — E785 Hyperlipidemia, unspecified: Secondary | ICD-10-CM

## 2014-06-01 DIAGNOSIS — I2510 Atherosclerotic heart disease of native coronary artery without angina pectoris: Secondary | ICD-10-CM

## 2014-06-01 DIAGNOSIS — I2102 ST elevation (STEMI) myocardial infarction involving left anterior descending coronary artery: Secondary | ICD-10-CM

## 2014-06-01 DIAGNOSIS — I10 Essential (primary) hypertension: Secondary | ICD-10-CM

## 2014-06-01 MED ORDER — TICAGRELOR 60 MG PO TABS: 60.0000 mg | ORAL_TABLET | Freq: Two times a day (BID) | ORAL | Status: DC

## 2014-06-01 NOTE — Patient Instructions (Addendum)
Your physician has recommended you make the following change in your medication:  Brilinta has been changed to 60 mg twice daily. This has been sent to your pharmacy. STOP the Zetia.   Your physician recommends that you return for lab work in: 6 months. Lab slips will be sent to you when it's time to have these labs drawn.  Your physician wants you to follow-up in: 6 months or sooner if needed with Dr. Claiborne Billings. You will receive a reminder letter in the mail two months in advance. If you don't receive a letter, please call our office to schedule the follow-up appointment.

## 2014-06-03 ENCOUNTER — Encounter: Payer: Self-pay | Admitting: Cardiovascular Disease

## 2014-06-03 NOTE — Progress Notes (Signed)
Patient ID: [name], female   DOB: 02-Aug-1934, 80 y.o.   MRN: [id number]      HPI: EQUILLA QUE is a 80 y.o. female who presents to the office today for a 6 month follow-up cardiology evaluation.  Ms. Andree Moro is originally from [place] and now resides in [place]. On 10/19/2012 she presented [facility] in transfer from [facility] with an anterior wall ST segment elevation myocardial infarction. She had chest pain of greater than 12 hours duration. I performed emergent cardiac catheterization  where her LAD was found to be totally occluded.  She underwent stenting of her LAD extending from the ostium to mid region with 3.0x38 mm and 3.0x16 mm Promus Premier DES stents. Initially she had significant wall motion abnormality involving the LAD territory. She also was found to have a 95% mid AV groove circumflex stenosis.  On 10/23/2012 she underwent successful staged intervention with insertion of a 2.25x12 mm Promus premier DES stent into the circumflex vessel. Subsequently she had done well. She did develop some hypotension leading to reduction in some of her medications.  She was enrolled in the [facility] cardiac rehabilitation program. An exercise Myoview study on 12/03/2012 demonstrated significant salvage of myocardium with only a small area of distal antero-apical, apical, septal and  apical scar without evidence for ischemia. Post-rest ejection fraction was 66% and there was evidence for apical akinesis. Laboratory revealed an increased LDL particle number at 1352 despite being on atorvastatin 80 mg. She was contacted and Zetia 10 mg was just added to this atorvastatin dose. She did have 735 small LDL particles and a calculated LDL of 90 with HDL 40 and triglycerides 119.  She had developed hematuria and was found to have left kidney stones. She is maintaining her current dose of Alimta. She denies chest pain. She is doing water aerobics and participated in cardiac  rehabilitation.  She underwent lithotripsy for her multiple kidney stones. A nuclear perfusion study prior to potential discontinuance of dual anti-platelet therapy on 11/05/2013  was interpreted as low risk, but did show extensive scar in the distal LAD to return distribution.  Post-rest ejection fraction was 40%.  There was evidence for apical akinesis/mild dyskinesis.  As I last saw her 6 months ago, she has remained fairly stable.  She has not had any recurrent episodes of kidney stones.  She denies chest pain.  She has remained active.  He denies bleeding.  She denies PND or orthopnea.  She is unaware of palpitations.  Past Medical History  Diagnosis Date  . Hypertension     Past Surgical History  Procedure Laterality Date  . Left heart catheterization with coronary angiogram  10/19/2012    Procedure: LEFT HEART CATHETERIZATION WITH CORONARY ANGIOGRAM;  Surgeon: Troy Sine, MD;  Location: [facility] CATH LAB;  Service: Cardiovascular;;  . Percutaneous coronary stent intervention (pci-s)  10/19/2012    Procedure: PERCUTANEOUS CORONARY STENT INTERVENTION (PCI-S);  Surgeon: Troy Sine, MD;  Location: [facility] CATH LAB;  Service: Cardiovascular;;  . Percutaneous coronary stent intervention (pci-s) N/A 10/23/2012    Procedure: PERCUTANEOUS CORONARY STENT INTERVENTION (PCI-S);  Surgeon: Troy Sine, MD;  Location: [facility] CATH LAB;  Service: Cardiovascular;  Laterality: N/A;    Allergies  Allergen Reactions  . Penicillins Rash    Current Outpatient Prescriptions  Medication Sig Dispense Refill  . acetaminophen (TYLENOL) 500 MG tablet Take 500 mg by mouth every 6 (six) hours as needed.    [name] aspirin EC 81 MG  tablet Take 81 mg by mouth daily.    [name] atorvastatin (LIPITOR) 80 MG tablet TAKE 1 TABLET (80 MG TOTAL) BY MOUTH DAILY AT 6 PM. 30 tablet 11  . CALCIUM-VITAMIN D PO Take 1 tablet by mouth daily.     . Cholecalciferol (VITAMIN D PO) Take 1,000 mg by mouth daily.     . furosemide (LASIX) 20 MG  tablet TAKE 1 TABLET BY MOUTH DAILY. 90 tablet 1  . isosorbide mononitrate (IMDUR) 30 MG 24 hr tablet TAKE 1 TABLET BY MOUTH EVERY DAY (Patient taking differently: ith) 30 tablet 5  . losartan (COZAAR) 50 MG tablet Take 1 tablet (50 mg total) by mouth daily. 90 tablet 3  . Multiple Vitamin (MULTIVITAMIN WITH MINERALS) TABS Take 1 tablet by mouth daily.    . nitroGLYCERIN (NITROSTAT) 0.4 MG SL tablet Place 1 tablet (0.4 mg total) under the tongue every 5 (five) minutes x 3 doses as needed for chest pain. 25 tablet 2  . Nutritional Supplements (THERALITH XR PO) Take 1 tablet by mouth 2 (two) times daily.    [name] omeprazole (PRILOSEC) 20 MG capsule Take 20 mg by mouth daily.    . ticagrelor (BRILINTA) 60 MG TABS tablet Take 1 tablet (60 mg total) by mouth 2 (two) times daily. 60 tablet 6   No current facility-administered medications for this visit.    History   Social History  . Marital Status: Married    Spouse Name: N/A  . Number of Children: N/A  . Years of Education: N/A   Occupational History  . Not on file.   Social History Main Topics  . Smoking status: Never Smoker   . Smokeless tobacco: Never Used  . Alcohol Use: 0.6 oz/week    1 Glasses of wine per week  . Drug Use: No  . Sexual Activity: Yes   Other Topics Concern  . Not on file   Social History Narrative    Socially she is married. There is no tobacco use. She drinks alcohol rarely.  No family history on file.  ROS General: Negative; No fevers, chills, or night sweats;  HEENT: Negative; No changes in vision or hearing, sinus congestion, difficulty swallowing Pulmonary: Negative; No cough, wheezing, shortness of breath, hemoptysis Cardiovascular: Negative; No chest pain, presyncope, syncope, palpatations GI: Positive for GERD; No nausea, vomiting, diarrhea, or abdominal pain GU: Positive for kidney stones, contributing to left flank pain intermittently; No dysuria, hematuria, or difficulty voiding Musculoskeletal:  Negative; no myalgias, joint pain, or weakness Hematologic/Oncology: Negative; no easy bruising, bleeding Endocrine: Negative; no heat/cold intolerance; no diabetes Neuro: Negative; no changes in balance, headaches Skin: Negative; No rashes or skin lesions Psychiatric: Negative; No behavioral problems, depression Sleep: Negative; No snoring, daytime sleepiness, hypersomnolence, bruxism, restless legs, hypnogognic hallucinations, no cataplexy Other comprehensive 14 point system review is negative.   PE BP 114/62 mmHg  Pulse 52  Ht 5' 4.5" (1.638 m)  Wt 213 lb 1.6 oz (96.662 kg)  BMI 36.03 kg/m2  Repeat blood pressure by me 140/85. General: Alert, oriented, no distress.  Skin: normal turgor, no rashes HEENT: Normocephalic, atraumatic. Pupils round and reactive; sclera anicteric;no lid lag.  Nose without nasal septal hypertrophy Mouth/Parynx benign; Mallinpatti scale 3 Neck: No JVD, no carotid briuts; normal upstroke Lungs: clear to ausculatation and percussion; no wheezing or rales Chest wall: No tenderness to palpation Heart: RRR, s1 s2 normal 1/6 systolic murmur, no S3 or S4 gallop. Abdomen: soft, nontender; no hepatosplenomehaly, BS+; abdominal aorta nontender and  not dilated by palpation. Back: No CVA tenderness Pulses 2+ Extremities: no clubbing cyanosis or edema, Homan's sign negative  Neurologic: grossly nonfocal Psychological: Normal affect and mood  ECG (independently read by me): Normal sinus rhythm.  Old anteroseptal MI.  First-degree AV block.  October 27, 2013 ECG (independently read by me): Normal sinus rhythm at 60 beats per minute.  First degree AV block with PR interval at 276 ms.  Old anteroseptal MI with QS complex in V1 and V2.  EKG (independently read by me): Sinus rhythm at 55 beats per minute. First degree AV block. Poor progression compatible with her prior septal infarction. T-wave changes anterolaterally.  LABS:  BMET    Component Value Date/Time   NA  141 05/28/2014 1001   K 4.5 05/28/2014 1001   CL 107 05/28/2014 1001   CO2 28 05/28/2014 1001   GLUCOSE 83 05/28/2014 1001   BUN 18 05/28/2014 1001   CREATININE 0.91 05/28/2014 1001   CREATININE 0.91 10/24/2012 0428   CALCIUM 9.7 05/28/2014 1001   GFRNONAA 60* 10/24/2012 0428   GFRAA 70* 10/24/2012 0428     Hepatic Function Panel     Component Value Date/Time   PROT 6.5 05/28/2014 1001   ALBUMIN 4.0 05/28/2014 1001   AST 22 05/28/2014 1001   ALT 13 05/28/2014 1001   ALKPHOS 94 05/28/2014 1001   BILITOT 0.7 05/28/2014 1001     CBC    Component Value Date/Time   WBC 6.2 05/28/2014 1001   RBC 4.47 05/28/2014 1001   HGB 13.4 05/28/2014 1001   HCT 40.8 05/28/2014 1001   PLT 235 05/28/2014 1001   MCV 91.3 05/28/2014 1001   MCH 30.0 05/28/2014 1001   MCHC 32.8 05/28/2014 1001   RDW 14.1 05/28/2014 1001     BNP    Component Value Date/Time   PROBNP 1918.0* 10/23/2012 0420    Lipid Panel     Component Value Date/Time   CHOL 120 05/28/2014 1001   CHOL 141 04/16/2013 0924   TRIG 114 05/28/2014 1001   TRIG 112 04/16/2013 0924   HDL 55 05/28/2014 1001   HDL 53 04/16/2013 0924   CHOLHDL 2.2 05/28/2014 1001   VLDL 23 05/28/2014 1001   LDLCALC 42 05/28/2014 1001   LDLCALC 66 04/16/2013 0924     RADIOLOGY: No results found.    ASSESSMENT AND PLAN: Ms. Carole Binning is a 81 year-old female who presented to [facility] on 10/19/2012 after an approximate 12 hour delay from back and chest discomfort. When she arrived to [facility] she had excellent door to balloon time at 27 minutes in the setting of an anterior wall ST segment elevation myocardial infarction. She underwent successful stenting of her LAD and because of diffuse disease tandem DES stents were inserted.  Stage intervention to the circumflex was successful 4 days later.. She did have concomitant 50 - 60% RCA narrowing and a large dominant right coronary artery.  She has a history of ACE inhibitor  induced cough, but since he has been taking losartan .  She has done well.  There is no cough or wheezing..  A nuclear perfusion study  demonstrated significant salvage of myocardium in the majority of the LAD territory and showed distal apical scar.  Discussed with her mother recently completed Pegasys trial.  With her prior MI history, she meets criteria set forth in this trial and I have recommended that she continue DAPT with reduced dose of Brilinta at 60 mg twice a  day in addition to her baby aspirin.  Her most recent laboratory has shown excellent lipid studies with a total cholesterol of 120, LDL of 42, HDL 55 and triglycerides 114.  On her atorvastatin 80 mg in addition to Zetia.  I recommended she discontinue Zetia.  She is remaining angina free.  Her blood pressure is stable.  I will see her in 6 months for reevaluation or sooner if necessary.  Troy Sine, MD, [facility]  06/03/2014 7:29 PM

## 2014-08-06 ENCOUNTER — Other Ambulatory Visit: Payer: Self-pay | Admitting: *Deleted

## 2014-08-06 MED ORDER — LOSARTAN POTASSIUM 50 MG PO TABS: 50.0000 mg | ORAL_TABLET | Freq: Every day | ORAL | Status: DC

## 2014-08-17 DIAGNOSIS — M8580 Other specified disorders of bone density and structure, unspecified site: Secondary | ICD-10-CM | POA: Insufficient documentation

## 2014-09-12 ENCOUNTER — Other Ambulatory Visit: Payer: Self-pay | Admitting: Cardiovascular Disease

## 2014-09-14 NOTE — Telephone Encounter (Signed)
Rx(s) sent to pharmacy electronically.  

## 2014-09-15 ENCOUNTER — Telehealth: Payer: Self-pay | Admitting: Cardiovascular Disease

## 2014-09-15 DIAGNOSIS — I255 Ischemic cardiomyopathy: Secondary | ICD-10-CM

## 2014-09-15 DIAGNOSIS — I2102 ST elevation (STEMI) myocardial infarction involving left anterior descending coronary artery: Secondary | ICD-10-CM

## 2014-09-15 DIAGNOSIS — E785 Hyperlipidemia, unspecified: Secondary | ICD-10-CM

## 2014-09-15 NOTE — Telephone Encounter (Signed)
Mrs.Kalata is calling to get lab orders sent to her  before her appt on 12/21/14.. Thanks

## 2014-09-15 NOTE — Telephone Encounter (Signed)
Returned call to patient fasting lab orders mailed.

## 2014-09-18 NOTE — Telephone Encounter (Signed)
Closed encounter °

## 2014-10-12 ENCOUNTER — Telehealth: Payer: Self-pay | Admitting: Cardiovascular Disease

## 2014-10-12 NOTE — Telephone Encounter (Signed)
[place] for [employer]

## 2014-10-12 NOTE — Telephone Encounter (Signed)
Mrs.Canter is calling because her pulse rate is always between 40-45 . Having some shortness of breath, sometimes.Please call    Thanks

## 2014-10-12 NOTE — Telephone Encounter (Signed)
Msg sent to scheduling, pt notified they will call - she voiced acknowledgment.

## 2014-10-12 NOTE — Telephone Encounter (Signed)
Patient noted pulse rate between 40-45, last time she was at PCP about a week ago. She is having occasional exertional dyspnea, but o/w no symptoms or concerns. She denies syncopal episodes, noted this was a concern PCP had.  No hx of a fib/arrythmias, she is not on rate controlling meds. Hx of CAD, STEMI w/ DESx2 2 yrs ago, ischemic cardiomyopathy w/ est EF 35-40% by echo 2 yrs ago.  Pt has upcoming appt w/ Dr. Claiborne Billings 12/21/14.  Discussed may be advised to be seen in interim by PA - will confirm w/ Dr. Stanford Breed ([facility]).

## 2014-11-05 ENCOUNTER — Ambulatory Visit (INDEPENDENT_AMBULATORY_CARE_PROVIDER_SITE_OTHER): Payer: Medicare Other | Admitting: Cardiology

## 2014-11-05 ENCOUNTER — Encounter (INDEPENDENT_AMBULATORY_CARE_PROVIDER_SITE_OTHER): Payer: Medicare Other

## 2014-11-05 ENCOUNTER — Encounter: Payer: Self-pay | Admitting: Cardiology

## 2014-11-05 VITALS — BP 110/60 | HR 52 | Ht 64.0 in | Wt 207.4 lb

## 2014-11-05 DIAGNOSIS — R001 Bradycardia, unspecified: Secondary | ICD-10-CM

## 2014-11-05 MED ORDER — NITROGLYCERIN 0.4 MG SL SUBL: 0.4000 mg | SUBLINGUAL_TABLET | SUBLINGUAL | Status: DC | PRN

## 2014-11-05 NOTE — Progress Notes (Signed)
11/05/2014 Susan [name]   18-Aug-1934  [id number]  Primary Physician [name], MD Primary Cardiologist: Dr. Claiborne Billings Electrophysiologist: N/A  Reason for Visit/CC: Resting Bradycardia  HPI:  The patient is a 80 year old female followed by Dr. Claiborne Billings with a history of coronary artery disease. In July 2014, she presented to [facility] with an anterior wall STEMI and underwent emergent cardiac catheterization where her LAD was found to be totally occluded. She underwent stenting of her LAD extending from the ostium to the mid region with a 3.0x38 mm and 3.0x16 mm Promus Premier DES stents. Initially she had significant wall motion abnormality involving the LAD territory. She also was found to have a 95% mid AV groove circumflex stenosis. On 10/23/2012 she underwent successful staged intervention with insertion of a 2.25x12 mm Promus premier DES stent into the circumflex vessel.  She has done well with this since. In August 2015, she underwent a nuclear stress study that was interpreted as low risk but did show extensive scar in the distal LAD distribution. Post rest ejection fraction was 40%. her other medical problems include a history of treated hypertension and hyperlipidemia.  She presents to clinic today with concerns regarding resting bradycardia. She keeps a regular check of her blood pressure and pulse rate at home and keeps a daily record book of her vital signs. She has noticed persistent readings of bradycardia with rates averaging in the upper 40s to low 50s. She has also had documentation of heart rates as low as the mid 30s. Despite this, she has remained fairly asymptomatic. She denies feeling dizzy or lightheaded. No syncope/near-syncope. No chest pain and no dyspnea. She  is not on any AV nodal blocking agents. TSH was recently checked by Dr. Claiborne Billings at her last office visit in March 2016 and was within normal limits. Records reveal that her TSH has also been normal over  the last 2 years.  EKG is obtained in clinic today and reveals sinus bradycardia with a resting rate of 52 bpm.   Current Outpatient Prescriptions  Medication Sig Dispense Refill  . acetaminophen (TYLENOL) 500 MG tablet Take 500 mg by mouth every 6 (six) hours as needed.    [name] aspirin EC 81 MG tablet Take 81 mg by mouth daily.    [name] atorvastatin (LIPITOR) 80 MG tablet TAKE 1 TABLET (80 MG TOTAL) BY MOUTH DAILY AT 6 PM. 30 tablet 11  . furosemide (LASIX) 20 MG tablet TAKE 1 TABLET BY MOUTH DAILY. 90 tablet 2  . isosorbide mononitrate (IMDUR) 30 MG 24 hr tablet TAKE 1 TABLET BY MOUTH EVERY DAY (Patient taking differently: ith) 30 tablet 5  . losartan (COZAAR) 50 MG tablet Take 1 tablet (50 mg total) by mouth daily. 90 tablet 3  . Multiple Vitamin (MULTIVITAMIN WITH MINERALS) TABS Take 1 tablet by mouth daily.    . nitroGLYCERIN (NITROSTAT) 0.4 MG SL tablet Place 1 tablet (0.4 mg total) under the tongue every 5 (five) minutes x 3 doses as needed for chest pain. 25 tablet 2  . omeprazole (PRILOSEC) 20 MG capsule Take 20 mg by mouth daily.    . ticagrelor (BRILINTA) 60 MG TABS tablet Take 1 tablet (60 mg total) by mouth 2 (two) times daily. 60 tablet 6  . Vitamin D, Cholecalciferol, 1000 UNITS CAPS Take 1 capsule by mouth daily.     No current facility-administered medications for this visit.    Allergies  Allergen Reactions  . Penicillins Rash    Social History  Social History  . Marital Status: Married    Spouse Name: N/A  . Number of Children: N/A  . Years of Education: N/A   Occupational History  . Not on file.   Social History Main Topics  . Smoking status: Never Smoker   . Smokeless tobacco: Never Used  . Alcohol Use: 0.6 oz/week    1 Glasses of wine per week  . Drug Use: No  . Sexual Activity: Yes   Other Topics Concern  . Not on file   Social History Narrative     Review of Systems: General: negative for chills, fever, night sweats or weight changes.    Cardiovascular: negative for chest pain, dyspnea on exertion, edema, orthopnea, palpitations, paroxysmal nocturnal dyspnea or shortness of breath Dermatological: negative for rash Respiratory: negative for cough or wheezing Urologic: negative for hematuria Abdominal: negative for nausea, vomiting, diarrhea, bright red blood per rectum, melena, or hematemesis Neurologic: negative for visual changes, syncope, or dizziness All other systems reviewed and are otherwise negative except as noted above.    Blood pressure 110/60, pulse 52, height 5\' 4"  (1.626 m), weight 207 lb 6.4 oz (94.076 kg).  General appearance: alert, cooperative and no distress Neck: no carotid bruit and no JVD Lungs: clear to auscultation bilaterally Heart: regular rate and rhythm and 1/6 SM at RUSB Extremities: no LEE Pulses: 2+ and symmetric Skin: warm and dry Neurologic: Grossly normal  EKG sinus bradycardia. 52 bpm. No ischemia.   ASSESSMENT AND PLAN:   1. Resting bradycardia: Patient has had pulse rates averaging in the upper 40s to low 50s. EKG in clinic today shows sinus bradycardia with a heart rate of 52 bpm. She is on no AV nodal blocking agents. Previous thyroid studies have been unremarkable. She is entirely asymptomatic. We'll plan for a two-week heart monitor to further assess the severity and frequency of her bradycardia, and determine whether or not she will need a permanent pacemaker.  2. CAD: s/p anterior STEMI in 2014 resulting in stenting of her LAD followed by staged intervention of the circumflex. Follow-up nuclear stress test in 2015 was negative for ischemia. She denies any recurrent anginal symptoms. Continue current medical therapy  3. Hyperlipidemia: Continue statin therapy with Lipitor.   PLAN  2 week event monitor. F/u in 2-3 weeks. Plan for EP evaluation if significant bradycardia is noted.   Lyda Jester PA-C 11/05/2014 3:11 PM

## 2014-11-05 NOTE — Patient Instructions (Signed)
Your physician has recommended that you wear an event monitor. Event monitors are medical devices that record the heart's electrical activity. Doctors most often [place] these monitors to diagnose arrhythmias. Arrhythmias are problems with the speed or rhythm of the heartbeat. The monitor is a small, portable device. You can wear one while you do your normal daily activities. This is usually used to diagnose what is causing palpitations/syncope (passing out).  Brittainy Simmons, PA-C, recommends that you schedule a follow-up appointment in 2-3 weeks with an extender.  Your Doctor has ordered you to wear a heart monitor. You will wear this for 2 weeks.   TIPS -  REMINDERS 1. The sensor is the lanyard that is worn around your neck every day - this is powered by a battery that needs to be changed every day 2. The monitor is the device that allows you to record symptoms - this will need to be charged daily 3. The sensor & monitor need to be within 100 feet of each other at all times 4. The sensor connects to the electrodes (stickers) - these should be changed every 24-48 hours (you do not have to remove them when you bathe, just make sure they are dry when you connect it back to the sensor 5. If you need more supplies (electrodes, batteries), please call the 1-800 # on the back of the pamphlet and [employer] will mail you more supplies 6. If your skin becomes sensitive, please try the sample pack of sensitive skin electrodes (the white packet in your silver box) and call [employer] to have them mail you more of these type of electrodes 7. When you are finish wearing the monitor, please place all supplies back in the silver box, place the silver box in the pre-packaged UPS bag and drop off at [employer] or call them so they can come pick it up   Cardiac Event Monitoring A cardiac event monitor is a small recording device used to help detect abnormal heart rhythms (arrhythmias). The monitor is used to record heart  rhythm when noticeable symptoms such as the following occur:  Fast heartbeats (palpitations), such as heart racing or fluttering.  Dizziness.  Fainting or light-headedness.  Unexplained weakness. The monitor is wired to two electrodes placed on your chest. Electrodes are flat, sticky disks that attach to your skin. The monitor can be worn for up to 30 days. You will wear the monitor at all times, except when bathing.  HOW TO USE YOUR CARDIAC EVENT MONITOR A technician will prepare your chest for the electrode placement. The technician will show you how to place the electrodes, how to work the monitor, and how to replace the batteries. Take time to practice using the monitor before you leave the office. Make sure you understand how to send the information from the monitor to your health care provider. This requires a telephone with a landline, not a cell phone. You need to:  Wear your monitor at all times, except when you are in water:  Do not get the monitor wet.  Take the monitor off when bathing. Do not swim or use a hot tub with it on.  Keep your skin clean. Do not put body lotion or moisturizer on your chest.  Change the electrodes daily or any time they stop sticking to your skin. You might need to use tape to keep them on.  It is possible that your skin under the electrodes could become irritated. To keep this from happening, try to put  the electrodes in slightly different places on your chest. However, they must remain in the area under your left breast and in the upper right section of your chest.  Make sure the monitor is safely clipped to your clothing or in a location close to your body that your health care provider recommends.  Press the button to record when you feel symptoms of heart trouble, such as dizziness, weakness, light-headedness, palpitations, thumping, shortness of breath, unexplained weakness, or a fluttering or racing heart. The monitor is always on and records  what happened slightly before you pressed the button, so do not worry about being too late to get good information.  Keep a diary of your activities, such as walking, doing chores, and taking medicine. It is especially important to note what you were doing when you pushed the button to record your symptoms. This will help your health care provider determine what might be contributing to your symptoms. The information stored in your monitor will be reviewed by your health care provider alongside your diary entries.  Send the recorded information as recommended by your health care provider. It is important to understand that it will take some time for your health care provider to process the results.  Change the batteries as recommended by your health care provider. SEEK IMMEDIATE MEDICAL CARE IF:   You have chest pain.  You have extreme difficulty breathing or shortness of breath.  You develop a very fast heartbeat that persists.  You develop dizziness that does not go away.  You faint or constantly feel you are about to faint. Document Released: 12/21/2007 Document Revised: 07/28/2013 Document Reviewed: 09/09/2012 [employer] Patient Information 2015 [place], [place]. This information is not intended to replace advice given to you by your health care provider. Make sure you discuss any questions you have with your health care provider.

## 2014-11-19 ENCOUNTER — Encounter: Payer: Self-pay | Admitting: *Deleted

## 2014-11-19 ENCOUNTER — Other Ambulatory Visit: Payer: Self-pay | Admitting: *Deleted

## 2014-11-19 DIAGNOSIS — Z79899 Other long term (current) drug therapy: Secondary | ICD-10-CM

## 2014-11-19 DIAGNOSIS — E785 Hyperlipidemia, unspecified: Secondary | ICD-10-CM

## 2014-11-27 ENCOUNTER — Ambulatory Visit (INDEPENDENT_AMBULATORY_CARE_PROVIDER_SITE_OTHER): Payer: Medicare Other | Admitting: Physician Assistant

## 2014-11-27 ENCOUNTER — Encounter: Payer: Self-pay | Admitting: Physician Assistant

## 2014-11-27 VITALS — BP 130/68 | HR 48 | Ht 63.0 in | Wt 214.4 lb

## 2014-11-27 DIAGNOSIS — R001 Bradycardia, unspecified: Secondary | ICD-10-CM | POA: Diagnosis not present

## 2014-11-27 DIAGNOSIS — I472 Ventricular tachycardia: Secondary | ICD-10-CM | POA: Diagnosis not present

## 2014-11-27 DIAGNOSIS — I4729 Other ventricular tachycardia: Secondary | ICD-10-CM

## 2014-11-27 NOTE — Patient Instructions (Signed)
NO CHANGE WITH CURRENT MEDICATIONS   You have been referred to  Dr Caryl Comes ( electrophysiologist)- bradycardia, NSVT  Keep appointment with Dr Claiborne Billings on Sept 26,2016 -

## 2014-11-27 NOTE — Progress Notes (Signed)
Patient ID: Susan [name], female   DOB: 09/23/1934, 80 y.o.   MRN: [id number]     Date:  11/27/2014   ID:  Susan [name], DOB 10/08/1934, MRN [id number]  PCP:  [name], MD  Primary Cardiologist:  Claiborne Billings   Chief Complaint  Patient presents with  . Follow-up    1 Month, follow up Monitor     History of Present Illness: Susan [name] is a 80 y.o. female with a history of coronary artery disease. In July 2014, she presented to [facility] with an anterior wall STEMI and underwent emergent cardiac catheterization where her LAD was found to be totally occluded. She underwent stenting of her LAD extending from the ostium to the mid region with a 3.0x38 mm and 3.0x16 mm Promus Premier DES stents. Initially she had significant wall motion abnormality involving the LAD territory. She also was found to have a 95% mid AV groove circumflex stenosis. On 10/23/2012 she underwent successful staged intervention with insertion of a 2.25x12 mm Promus premier DES stent into the circumflex vessel. She has done well with this since. In August 2015, she underwent a nuclear stress study that was interpreted as low risk but did show extensive scar in the distal LAD distribution. Post rest ejection fraction was 40%. her other medical problems include a history of treated hypertension and hyperlipidemia.  She was seen in the clinic 11/05/2014 due to concerns of a resting bradycardia she had documented heart rates as low as the mid 30s and was not on any AV nodal blocking agents at that time. TSH in March was within normal limits.  She was given a CardioNet heart monitor for 2 weeks which revealed sinus bradycardia as low as 34 bpm during early evening hours as well as nonsustained ventricular tachycardia as long as 22 beats. Patient denies any syncope, presyncope, dizziness or lightheadedness or chest pain.  She sings regularly in the choir.  She will be out of town from September 13 through the  15th.  The patient currently denies nausea, vomiting, fever, orthopnea, PND, cough, congestion, abdominal pain, hematochezia, melena, lower extremity edema, claudication.  Wt Readings from Last 3 Encounters:  11/27/14 214 lb 6.4 oz (97.251 kg)  11/05/14 207 lb 6.4 oz (94.076 kg)  06/01/14 213 lb 1.6 oz (96.662 kg)     Past Medical History  Diagnosis Date  . Hypertension     Current Outpatient Prescriptions  Medication Sig Dispense Refill  . acetaminophen (TYLENOL) 500 MG tablet Take 500 mg by mouth every 6 (six) hours as needed.    [name] aspirin EC 81 MG tablet Take 81 mg by mouth daily.    [name] atorvastatin (LIPITOR) 80 MG tablet TAKE 1 TABLET (80 MG TOTAL) BY MOUTH DAILY AT 6 PM. 30 tablet 11  . furosemide (LASIX) 20 MG tablet TAKE 1 TABLET BY MOUTH DAILY. 90 tablet 2  . isosorbide mononitrate (IMDUR) 30 MG 24 hr tablet TAKE 1 TABLET BY MOUTH EVERY DAY (Patient taking differently: ith) 30 tablet 5  . losartan (COZAAR) 50 MG tablet Take 1 tablet (50 mg total) by mouth daily. 90 tablet 3  . Multiple Vitamin (MULTIVITAMIN WITH MINERALS) TABS Take 1 tablet by mouth daily.    . nitroGLYCERIN (NITROSTAT) 0.4 MG SL tablet Place 1 tablet (0.4 mg total) under the tongue every 5 (five) minutes x 3 doses as needed for chest pain. 25 tablet 3  . omeprazole (PRILOSEC) 20 MG capsule Take 20 mg by mouth daily.    [name]  ticagrelor (BRILINTA) 60 MG TABS tablet Take 1 tablet (60 mg total) by mouth 2 (two) times daily. 60 tablet 6  . Vitamin D, Cholecalciferol, 1000 UNITS CAPS Take 1 capsule by mouth daily.     No current facility-administered medications for this visit.    Allergies:    Allergies  Allergen Reactions  . Penicillins Rash    Social History:  The patient  reports that she has never smoked. She has never used smokeless tobacco. She reports that she drinks about 0.6 oz of alcohol per week. She reports that she does not use illicit drugs.   Family history:  No family history on file.  ROS:   Please see the history of present illness.  All other systems reviewed and negative.   PHYSICAL EXAM: VS:  BP 130/68 mmHg  Pulse 48  Ht 5\' 3"  (1.6 m)  Wt 214 lb 6.4 oz (97.251 kg)  BMI 37.99 kg/m2 obese, well developed, in no acute distress HEENT: Pupils are equal round react to light accommodation extraocular movements are intact.  Neck: no JVDNo cervical lymphadenopathy. Cardiac: Regular rate and rhythm with 1 out of 6 harsh systolic murmur  Lungs:  clear to auscultation bilaterally, no wheezing, rhonchi or rales Ext: no lower extremity edema.  2+ radial and dorsalis pedis pulses. Skin: warm and dry Neuro:  Grossly normal   ASSESSMENT AND PLAN:  Problem List Items Addressed This Visit    NSVT (nonsustained ventricular tachycardia)   Relevant Orders   Ambulatory referral to Cardiac Electrophysiology   Bradycardia- tolerating low dose beta blocker - Primary   Relevant Orders   Ambulatory referral to Cardiac Electrophysiology     Bradycardia Review of CardioNet data for 2 week period reveals a heart rate as low as 34 bpm(sinus bradycardia) which was at 7:00 in the evening; most likely when the patient was having her dinner.  Asymptomatic. She is on no rate control medications.  She will be referred to electrophysiology for consideration of pacemaker and perhaps ICD.  If she develops significant symptoms, I recommended she go immediately to the emergency room.  Nonsustained ventricular tachycardia Also seen on CardioNet monitor were 5 beats, 17 beat and 22 beat nonsustained ventricular tachycardia.  Most of these episodes occurred early in the morning while the patient was sleeping.  Unable to prescribe beta blocker due to significant bradycardia.  Coronary artery disease She has no complaints of angina. Last stress test August of last year was nonischemic with significant scar  Hyperlipidemia  Continue statin  Chronic systolic heart failure,  EF 35-40% by echocardiogram 2  years ago. She appears euvolemic and well compensated on only 20 mg of Lasix daily.

## 2014-12-14 ENCOUNTER — Other Ambulatory Visit: Payer: Self-pay | Admitting: Cardiovascular Disease

## 2014-12-14 NOTE — Telephone Encounter (Signed)
Rx request sent to pharmacy.  

## 2014-12-16 ENCOUNTER — Ambulatory Visit (INDEPENDENT_AMBULATORY_CARE_PROVIDER_SITE_OTHER): Payer: Medicare Other | Admitting: Cardiology

## 2014-12-16 ENCOUNTER — Encounter: Payer: Self-pay | Admitting: Cardiology

## 2014-12-16 VITALS — BP 146/84 | HR 53 | Ht 63.0 in | Wt 210.2 lb

## 2014-12-16 DIAGNOSIS — R001 Bradycardia, unspecified: Secondary | ICD-10-CM | POA: Diagnosis not present

## 2014-12-16 NOTE — Progress Notes (Signed)
Electrophysiology Office Note   Date:  12/16/2014   ID:  Susan [name], DOB 11-19-1934, MRN [id number]  PCP:  [name], MD  Cardiologist:  Shelva Majestic Primary Electrophysiologist:  Constance Haw, MD    Chief Complaint  Patient presents with  . NSVT  . Bradycardia     History of Present Illness: Susan [name] is a 80 y.o. female who presents today for electrophysiology evaluation.   History of coronary artery disease. In July 2014, she presented to [facility] with an anterior wall STEMI and underwent emergent cardiac catheterization where her LAD was found to be totally occluded. She underwent stenting of her LAD extending from the ostium to the mid region with a 3.0x38 mm and 3.0x16 mm Promus Premier DES stents. Initially she had significant wall motion abnormality involving the LAD territory. She also was found to have a 95% mid AV groove circumflex stenosis. On 10/23/2012 she underwent successful staged intervention with insertion of a 2.25x12 mm Promus premier DES stent into the circumflex vessel. She has done well with this since. In August 2015, she underwent a nuclear stress study that was interpreted as low risk but did show extensive scar in the distal LAD distribution. Post rest ejection fraction was 40%. her other medical problems include a history of treated hypertension and hyperlipidemia.   Today, she denies symptoms of palpitations, chest pain, shortness of breath, orthopnea, PND, lower extremity edema, claudication, dizziness, presyncope, syncope, bleeding, or neurologic sequela. The patient is tolerating medications without difficulties and is otherwise without complaint today. She is able to participate in her water aerobics.  She can walk on flat ground without issues other than back pain.     Past Medical History  Diagnosis Date  . Hypertension   . Coronary artery disease   . Hyperlipidemia   . NSVT (nonsustained ventricular  tachycardia)   . Bradycardia    Past Surgical History  Procedure Laterality Date  . Left heart catheterization with coronary angiogram  10/19/2012    Procedure: LEFT HEART CATHETERIZATION WITH CORONARY ANGIOGRAM;  Surgeon: Troy Sine, MD;  Location: [facility] CATH LAB;  Service: Cardiovascular;;  . Percutaneous coronary stent intervention (pci-s)  10/19/2012    Procedure: PERCUTANEOUS CORONARY STENT INTERVENTION (PCI-S);  Surgeon: Troy Sine, MD;  Location: [facility] CATH LAB;  Service: Cardiovascular;;  . Percutaneous coronary stent intervention (pci-s) N/A 10/23/2012    Procedure: PERCUTANEOUS CORONARY STENT INTERVENTION (PCI-S);  Surgeon: Troy Sine, MD;  Location: [facility] CATH LAB;  Service: Cardiovascular;  Laterality: N/A;     Current Outpatient Prescriptions  Medication Sig Dispense Refill  . acetaminophen (TYLENOL) 500 MG tablet Take 500 mg by mouth every 6 (six) hours as needed.    [name] aspirin EC 81 MG tablet Take 81 mg by mouth daily.    [name] atorvastatin (LIPITOR) 80 MG tablet TAKE 1 TABLET BY MOUTH AT 6PM 30 tablet 0  . furosemide (LASIX) 20 MG tablet TAKE 1 TABLET BY MOUTH DAILY. 90 tablet 2  . isosorbide mononitrate (IMDUR) 30 MG 24 hr tablet TAKE 1 TABLET BY MOUTH EVERY DAY (Patient taking differently: ith) 30 tablet 5  . losartan (COZAAR) 50 MG tablet Take 1 tablet (50 mg total) by mouth daily. 90 tablet 3  . Multiple Vitamin (MULTIVITAMIN WITH MINERALS) TABS Take 1 tablet by mouth daily.    . nitroGLYCERIN (NITROSTAT) 0.4 MG SL tablet Place 1 tablet (0.4 mg total) under the tongue every 5 (five) minutes x 3 doses  as needed for chest pain. 25 tablet 3  . omeprazole (PRILOSEC) 20 MG capsule Take 20 mg by mouth daily.    . ticagrelor (BRILINTA) 60 MG TABS tablet Take 1 tablet (60 mg total) by mouth 2 (two) times daily. 60 tablet 6  . Vitamin D, Cholecalciferol, 1000 UNITS CAPS Take 1 capsule by mouth daily.     No current facility-administered medications for this visit.    Allergies:    Penicillins   Social History:  The patient  reports that she has never smoked. She has never used smokeless tobacco. She reports that she drinks about 0.6 oz of alcohol per week. She reports that she does not use illicit drugs.   Family History:  The patient's family history includes Breast cancer in her mother; Diabetes in her brother and father; Heart disease in her father and mother; Stroke in her brother.    ROS:  Please see the history of present illness.   Otherwise, review of systems is positive for bradycardia.   All other systems are reviewed and negative.    PHYSICAL EXAM: VS:  BP 146/84 mmHg  Pulse 53  Ht 5\' 3"  (1.6 m)  Wt 210 lb 3.2 oz (95.346 kg)  BMI 37.24 kg/m2 , BMI Body mass index is 37.24 kg/(m^2). GEN: Well nourished, well developed, in no acute distress HEENT: normal Neck: no JVD, carotid bruits, or masses Cardiac: RRR; no murmurs, rubs, or gallops,no edema  Respiratory:  clear to auscultation bilaterally, normal work of breathing GI: soft, nontender, nondistended, + BS MS: no deformity or atrophy Skin: warm and dry, device pocket is well healed Neuro:  Strength and sensation are intact Psych: euthymic mood, full affect  EKG:  EKG is ordered today. The ekg ordered today shows sinus bradycardia, rate 53, 1 degree AVB, old anterior MI, LAFB  Recent Labs: 05/28/2014: ALT 13; BUN 18; Creat 0.91; Hemoglobin 13.4; Platelets 235; Potassium 4.5; Sodium 141; TSH 1.167    Lipid Panel     Component Value Date/Time   CHOL 120 05/28/2014 1001   CHOL 141 04/16/2013 0924   TRIG 114 05/28/2014 1001   TRIG 112 04/16/2013 0924   HDL 55 05/28/2014 1001   HDL 53 04/16/2013 0924   CHOLHDL 2.2 05/28/2014 1001   VLDL 23 05/28/2014 1001   LDLCALC 42 05/28/2014 1001   LDLCALC 66 04/16/2013 0924     Wt Readings from Last 3 Encounters:  12/16/14 210 lb 3.2 oz (95.346 kg)  11/27/14 214 lb 6.4 oz (97.251 kg)  11/05/14 207 lb 6.4 oz (94.076 kg)      Other studies  Reviewed: Additional studies/ records that were reviewed today include: Event monitor 11/05/14 Review of the above records today demonstrates:  1. nsr 2. Sinus bradycardia 3. NSVT 4. PVC's   ASSESSMENT AND PLAN:  1.  Sinus bradycardia at rest: HR today in clinic is 53.  Has been in the mid 40s when sleeping.  Is able to exercise and walk without limitation other than back pain.  She does have first degree AV block on her ECG and sinus bradycardia, but without symptoms she does not qualify for pacemaker placement.  I told her to call back if she develops symptoms of dizziness, lightheadedness, or syncope and will schedule pacemaker at that time.  2. Coronary artery disease: stable and currently asymptomatic.  Will make no medication changes.    Current medicines are reviewed at length with the patient today.   The patient does not have concerns  regarding her medicines.  The following changes were made today:  none  Labs/ tests ordered today include: none  Orders Placed This Encounter  Procedures  . EKG 12-Lead     Disposition:   FU with Dr. Claiborne Billings at next appointment  Signed, Will Meredith Leeds, MD  12/16/2014 12:15 PM     [address] [place] [address] [phone] (office) [phone] (fax)

## 2014-12-16 NOTE — Patient Instructions (Signed)
Medication Instructions:  Your physician recommends that you continue on your current medications as directed. Please refer to the Current Medication list given to you today.  Labwork: None ordered  Testing/Procedures: None ordered  Follow-Up: Please call office if you have symptoms.  No follow up is needed at this time.  Any Other Special Instructions Will Be Listed Below (If Applicable). Thank you for choosing [facility]!!   Trinidad Curet, RN [phone]

## 2014-12-17 ENCOUNTER — Other Ambulatory Visit: Payer: Self-pay | Admitting: Cardiovascular Disease

## 2014-12-17 LAB — COMPREHENSIVE METABOLIC PANEL
ALK PHOS: 92 U/L (ref 33–130)
ALT: 16 U/L (ref 6–29)
AST: 28 U/L (ref 10–35)
Albumin: 4.1 g/dL (ref 3.6–5.1)
BILIRUBIN TOTAL: 0.8 mg/dL (ref 0.2–1.2)
BUN: 19 mg/dL (ref 7–25)
CO2: 27 mmol/L (ref 20–31)
CREATININE: 0.82 mg/dL (ref 0.60–0.93)
Calcium: 9.7 mg/dL (ref 8.6–10.4)
Chloride: 105 mmol/L (ref 98–110)
GLUCOSE: 79 mg/dL (ref 65–99)
Potassium: 4.5 mmol/L (ref 3.5–5.3)
Sodium: 141 mmol/L (ref 135–146)
TOTAL PROTEIN: 6.5 g/dL (ref 6.1–8.1)

## 2014-12-17 LAB — CBC
HCT: 41.8 % (ref 36.0–46.0)
HEMOGLOBIN: 13.8 g/dL (ref 12.0–15.0)
MCH: 30 pg (ref 26.0–34.0)
MCHC: 33 g/dL (ref 30.0–36.0)
MCV: 90.9 fL (ref 78.0–100.0)
MPV: 9.3 fL (ref 8.6–12.4)
Platelets: 249 10*3/uL (ref 150–400)
RBC: 4.6 MIL/uL (ref 3.87–5.11)
RDW: 13.9 % (ref 11.5–15.5)
WBC: 6.5 10*3/uL (ref 4.0–10.5)

## 2014-12-17 LAB — LIPID PANEL
CHOLESTEROL: 177 mg/dL (ref 125–200)
HDL: 52 mg/dL (ref >=46)
LDL Cholesterol: 95 mg/dL (ref <130)
Total CHOL/HDL Ratio: 3.4 Ratio (ref <=5.0)
Triglycerides: 152 mg/dL — ABNORMAL HIGH (ref <150)
VLDL: 30 mg/dL (ref <30)

## 2014-12-17 MED ORDER — ATORVASTATIN CALCIUM 80 MG PO TABS: 80.0000 mg | ORAL_TABLET | Freq: Every day | ORAL | Status: DC

## 2014-12-21 ENCOUNTER — Ambulatory Visit (INDEPENDENT_AMBULATORY_CARE_PROVIDER_SITE_OTHER): Payer: Medicare Other | Admitting: Cardiovascular Disease

## 2014-12-21 VITALS — BP 100/60 | HR 52 | Ht 63.0 in | Wt 209.6 lb

## 2014-12-21 DIAGNOSIS — I10 Essential (primary) hypertension: Secondary | ICD-10-CM | POA: Diagnosis not present

## 2014-12-21 DIAGNOSIS — E785 Hyperlipidemia, unspecified: Secondary | ICD-10-CM | POA: Diagnosis not present

## 2014-12-21 DIAGNOSIS — I472 Ventricular tachycardia: Secondary | ICD-10-CM

## 2014-12-21 DIAGNOSIS — I4729 Other ventricular tachycardia: Secondary | ICD-10-CM

## 2014-12-21 DIAGNOSIS — R0683 Snoring: Secondary | ICD-10-CM | POA: Diagnosis not present

## 2014-12-21 DIAGNOSIS — I2510 Atherosclerotic heart disease of native coronary artery without angina pectoris: Secondary | ICD-10-CM

## 2014-12-21 MED ORDER — EZETIMIBE 10 MG PO TABS: 10.0000 mg | ORAL_TABLET | Freq: Every day | ORAL | Status: DC

## 2014-12-21 NOTE — Patient Instructions (Signed)
Your physician wants you to follow-up in: [place] will receive a reminder letter in the mail two months in advance. If you don't receive a letter, please call our office to schedule the follow-up appointment.   START ZETIA 10 MG ONCE DAILY  Your physician has recommended that you have a sleep study. This test records several body functions during sleep, including: brain activity, eye movement, oxygen and carbon dioxide blood levels, heart rate and rhythm, breathing rate and rhythm, the flow of air through your mouth and nose, snoring, body muscle movements, and chest and belly movement.

## 2014-12-23 ENCOUNTER — Encounter: Payer: Self-pay | Admitting: Cardiovascular Disease

## 2014-12-23 NOTE — Progress Notes (Signed)
Patient ID: Susan [name], female   DOB: [birth year], 80 y.o.   MRN: [id number]      HPI: SYNDEY JASKOLSKI is a 80 y.o. female who presents to the office today for a 6 month follow-up cardiology evaluation.  [name] is originally from [place], [place] and now resides in [place]. On 10/19/2012 she presented [name] in transfer from [facility] with an anterior wall ST segment elevation myocardial infarction. She had chest pain of greater than 12 hours duration. I performed emergent cardiac catheterization  where her LAD was found to be totally occluded.  She underwent stenting of her LAD extending from the ostium to mid region with 3.0x38 mm and 3.0x16 mm Promus Premier DES stents. Initially she had significant wall motion abnormality involving the LAD territory. She also was found to have a 95% mid AV groove circumflex stenosis.  On 10/23/2012 she underwent successful staged intervention with insertion of a 2.25x12 mm Promus premier DES stent into the circumflex vessel. Subsequently she had done well. She did develop some hypotension leading to reduction in some of her medications.  She was enrolled in the [place] cardiac rehabilitation program. An exercise Myoview study on 12/03/2012 demonstrated significant salvage of myocardium with only a small area of distal antero-apical, apical, septal and  apical scar without evidence for ischemia. Post-rest ejection fraction was 66% and there was evidence for apical akinesis. Laboratory revealed an increased LDL particle number at 1352 despite being on atorvastatin 80 mg. She was contacted and Zetia 10 mg was just added to this atorvastatin dose. She did have 735 small LDL particles and a calculated LDL of 90 with HDL 40 and triglycerides 119.  She had developed hematuria and was found to have left kidney stones.  She underwent lithotripsy for her multiple kidney stones. A nuclear perfusion study prior to potential  discontinuance of dual anti-platelet therapy on 11/05/2013  was interpreted as low risk, but did show extensive scar in the distal LAD to return distribution.  Post-rest ejection fraction was 40%.  There was evidence for apical akinesis/mild dyskinesis.  When I last saw her 6 months ago.  She was doing well, remaining stable.  She subsequently was seen in the office on 3 occasions by Lesia Hausen, [name], and most recently [name].  She was found to have bradycardia and also episodes of nonsustained ventricular tachycardia.  Her bradycardia has been more profound while sleeping.  She was able to exercise and walk without limitation other than back discomfort.  She was evaluated for possible indication for pacemaker was not felt that a pacemaker is indicated presently.  Because of her bradycardia.  She has not been on beta blocker therapy.  She has had episodes of nonsustained VT noted on a CardioNet monitor.  She denies chest pain.  She is unaware of recent palpitations.  She admits that her sleep is not well and is not restorative.  She does snore loudly.  She  takes daytime naps almost on a daily basis.  She goes to bed at approximately 11 to 11:30 PM and wakes up at 7:30 AM.  She presents for evaluation.   Past Medical History  Diagnosis Date  . Hypertension   . Coronary artery disease   . Hyperlipidemia   . NSVT (nonsustained ventricular tachycardia)   . Bradycardia     Past Surgical History  Procedure Laterality Date  . Left heart catheterization with coronary angiogram  10/19/2012    Procedure: LEFT HEART  CATHETERIZATION WITH CORONARY ANGIOGRAM;  Surgeon: Troy Sine, MD;  Location: [facility] CATH LAB;  Service: Cardiovascular;;  . Percutaneous coronary stent intervention (pci-s)  10/19/2012    Procedure: PERCUTANEOUS CORONARY STENT INTERVENTION (PCI-S);  Surgeon: Troy Sine, MD;  Location: [facility] CATH LAB;  Service: Cardiovascular;;  . Percutaneous coronary stent intervention  (pci-s) N/A 10/23/2012    Procedure: PERCUTANEOUS CORONARY STENT INTERVENTION (PCI-S);  Surgeon: Troy Sine, MD;  Location: [facility] CATH LAB;  Service: Cardiovascular;  Laterality: N/A;    Allergies  Allergen Reactions  . Penicillins Rash    Current Outpatient Prescriptions  Medication Sig Dispense Refill  . acetaminophen (TYLENOL) 500 MG tablet Take 500 mg by mouth every 6 (six) hours as needed.    [name] aspirin EC 81 MG tablet Take 81 mg by mouth daily.    [name] atorvastatin (LIPITOR) 80 MG tablet Take 1 tablet (80 mg total) by mouth daily at 6 PM. 90 tablet 0  . furosemide (LASIX) 20 MG tablet TAKE 1 TABLET BY MOUTH DAILY. 90 tablet 2  . isosorbide mononitrate (IMDUR) 30 MG 24 hr tablet TAKE 1 TABLET BY MOUTH EVERY DAY (Patient taking differently: ith) 30 tablet 5  . losartan (COZAAR) 50 MG tablet Take 1 tablet (50 mg total) by mouth daily. 90 tablet 3  . Multiple Vitamin (MULTIVITAMIN WITH MINERALS) TABS Take 1 tablet by mouth daily.    . nitroGLYCERIN (NITROSTAT) 0.4 MG SL tablet Place 1 tablet (0.4 mg total) under the tongue every 5 (five) minutes x 3 doses as needed for chest pain. 25 tablet 3  . omeprazole (PRILOSEC) 20 MG capsule Take 20 mg by mouth daily.    . ticagrelor (BRILINTA) 60 MG TABS tablet Take 1 tablet (60 mg total) by mouth 2 (two) times daily. 60 tablet 6  . Vitamin D, Cholecalciferol, 1000 UNITS CAPS Take 1 capsule by mouth daily.    [name] ezetimibe (ZETIA) 10 MG tablet Take 1 tablet (10 mg total) by mouth daily. 30 tablet 11   No current facility-administered medications for this visit.    Social History   Social History  . Marital Status: Married    Spouse Name: N/A  . Number of Children: N/A  . Years of Education: N/A   Occupational History  . Not on file.   Social History Main Topics  . Smoking status: Never Smoker   . Smokeless tobacco: Never Used  . Alcohol Use: 0.6 oz/week    1 Glasses of wine per week  . Drug Use: No  . Sexual Activity: Yes   Other  Topics Concern  . Not on file   Social History Narrative    Socially she is married. There is no tobacco use. She drinks alcohol rarely.  Family History  Problem Relation Age of Onset  . Heart disease Mother   . Breast cancer Mother   . Heart disease Father   . Diabetes Father   . Diabetes Brother   . Stroke Brother     ROS General: Negative; No fevers, chills, or night sweats;  HEENT: Negative; No changes in vision or hearing, sinus congestion, difficulty swallowing Pulmonary: Negative; No cough, wheezing, shortness of breath, hemoptysis Cardiovascular: Negative; No chest pain, presyncope, syncope, palpatations GI: Positive for GERD; No nausea, vomiting, diarrhea, or abdominal pain GU: Positive for kidney stones, contributing to left flank pain intermittently; No dysuria, hematuria, or difficulty voiding Musculoskeletal: Negative; no myalgias, joint pain, or weakness Hematologic/Oncology: Negative; no easy bruising, bleeding Endocrine: Negative; no  heat/cold intolerance; no diabetes Neuro: Negative; no changes in balance, headaches Skin: Negative; No rashes or skin lesions Psychiatric: Negative; No behavioral problems, depression Sleep: Negative; No snoring, daytime sleepiness, hypersomnolence, bruxism, restless legs, hypnogognic hallucinations, no cataplexy Other comprehensive 14 point system review is negative.   PE BP 100/60 mmHg  Pulse 52  Ht _0  (1.6 m)  Wt 209 lb 9.6 oz (95.074 kg)  BMI 37.14 kg/m2  Repeat blood pressure by me 120/76.  Wt Readings from Last 3 Encounters:  12/21/14 209 lb 9.6 oz (95.074 kg)  12/16/14 210 lb 3.2 oz (95.346 kg)  11/27/14 214 lb 6.4 oz (97.251 kg)   General: Alert, oriented, no distress.  Skin: normal turgor, no rashes HEENT: Normocephalic, atraumatic. Pupils round and reactive; sclera anicteric;no lid lag.  Nose without nasal septal hypertrophy Mouth/Parynx benign; Mallinpatti scale 3 Neck: No JVD, no carotid briuts; normal  upstroke Lungs: clear to ausculatation and percussion; no wheezing or rales Chest wall: No tenderness to palpation Heart: RRR, s1 s2 normal 1/6 systolic murmur, no S3 or S4 gallop.  No diastolic.  No rubs thrills or heaves. Abdomen: soft, nontender; no hepatosplenomehaly, BS+; abdominal aorta nontender and not dilated by palpation. Back: No CVA tenderness Pulses 2+ Extremities: no clubbing cyanosis or edema, Homan's sign negative  Neurologic: grossly nonfocal Psychological: Normal affect and mood  ECG (independently read by me): Sinus bradycardia 52 bpm with first-degree block.  PR interval 284 ms.  LVH by voltage.  QS V1 through V3 concordant with old anteroseptal MI.  March 2016 ECG (independently read by me): Normal sinus rhythm.  Old anteroseptal MI.  First-degree AV block.  October 27, 2013 ECG (independently read by me): Normal sinus rhythm at 60 beats per minute.  First degree AV block with PR interval at 276 ms.  Old anteroseptal MI with QS complex in V1 and V2.  EKG (independently read by me): Sinus rhythm at 55 beats per minute. First degree AV block. Poor progression compatible with her prior septal infarction. T-wave changes anterolaterally.  LABS: BMP Latest Ref Rng 12/16/2014 05/28/2014 12/17/2013  Glucose 65 - 99 mg/dL 79 83 86  BUN 7 - 25 mg/dL 19 18 20(H)  Creatinine 0.60 - 0.93 mg/dL 0.82 0.91 0.97  Sodium 135 - 146 mmol/L 141 141 141  Potassium 3.5 - 5.3 mmol/L 4.5 4.5 4.1  Chloride 98 - 110 mmol/L 105 107 105  CO2 20 - 31 mmol/L 27 28 32  Calcium 8.6 - 10.4 mg/dL 9.7 9.7 9.5   Hepatic Function Latest Ref Rng 12/16/2014 05/28/2014 10/20/2013  Total Protein 6.1 - 8.1 g/dL 6.5 6.5 6.9  Albumin 3.6 - 5.1 g/dL 4.1 4.0 4.1  AST 10 - 35 U/L _1 ALT 6 - 29 U/L _2 Alk Phosphatase 33 - 130 U/L 92 94 100  Total Bilirubin 0.2 - 1.2 mg/dL 0.8 0.7 0.6   CBC Latest Ref Rng 12/16/2014 05/28/2014 10/20/2013  WBC 4.0 - 10.5 K/uL 6.5 6.2 7.0  Hemoglobin 12.0 - 15.0 g/dL 13.8  13.4 13.4  Hematocrit 36.0 - 46.0 % 41.8 40.8 40.3  Platelets 150 - 400 K/uL 249 235 261   Lab Results  Component Value Date   MCV 90.9 12/16/2014   MCV 91.3 05/28/2014   MCV 89.4 10/20/2013   Lab Results  Component Value Date   TSH 1.167 05/28/2014   Lab Results  Component Value Date   HGBA1C 5.7* 10/19/2012   Lipid Panel  Component Value Date/Time   CHOL 177 12/16/2014 1038   CHOL 141 04/16/2013 0924   TRIG 152* 12/16/2014 1038   TRIG 112 04/16/2013 0924   HDL 52 12/16/2014 1038   HDL 53 04/16/2013 0924   CHOLHDL 3.4 12/16/2014 1038   VLDL 30 12/16/2014 1038   LDLCALC 95 12/16/2014 1038   LDLCALC 66 04/16/2013 0924   RADIOLOGY: No results found.    ASSESSMENT AND PLAN: Ms. Carole Binning is a 80 year-old female who presented to [facility] on 10/19/2012 after an approximate 12 hour delay from back and chest discomfort. When she arrived to [facility] she had excellent door to balloon time at 27 minutes in the setting of an anterior wall ST segment elevation myocardial infarction. She underwent successful stenting of her LAD and because of diffuse disease tandem DES stents were inserted.  She underwent staged intervention to the circumflex  4 days later.. She did have concomitant 50 - 60% RCA narrowing and a large dominant right coronary artery.  She has a history of ACE inhibitor induced cough, but since he has been taking losartan .  She has done well.  There is no cough or wheezing..  A nuclear perfusion study  demonstrated significant salvage of myocardium in the majority of the LAD territory and showed distal apical scar.  I last saw her, I discussed the  Pegasys trial and recommended that she continue DAPT with reduced dose of Brilinta at 60 mg twice a day in addition to her baby aspirin.  Her most recent laboratory has shown excellent lipid studies with a total cholesterol of 120, LDL of 42, HDL 55 and triglycerides 114.  When she was last seen, her Zetia was  discontinued.  Repeat laboratory has now shown further increase of her LDL to 95 from a nadir of 66.  I have recommended reinstitution of Zetia 10 mg for more optimal treatment.  I reviewed her office notes from [place], [name], as well as Dr. Shonna Chock.  I reviewed her monitor.  Because of her recent bradycardia she is not on beta blocker therapy and Dr. Shonna Chock does not feel at this point, she is in need for pacemaker.  We will need to monitor any recurrent tachycardia dysrhythmia and his beta blocker is necessary apermanent pacemaker may be needed in the future.  Her sleeve history and bradycardia at night.  I am recommending a sleep study to evaluate for obstructive sleep apnea.  I will see her back in the office in follow-up and further recommendations will be made at that time.  Time spent: 25 minutes  Troy Sine, MD, [facility]  12/23/2014 2:52 PM

## 2015-01-08 ENCOUNTER — Other Ambulatory Visit: Payer: Self-pay | Admitting: Cardiovascular Disease

## 2015-01-08 NOTE — Telephone Encounter (Signed)
Rx(s) sent to pharmacy electronically.  

## 2015-01-22 ENCOUNTER — Other Ambulatory Visit: Payer: Self-pay | Admitting: Cardiovascular Disease

## 2015-02-12 ENCOUNTER — Other Ambulatory Visit: Payer: Self-pay

## 2015-02-12 MED ORDER — ISOSORBIDE MONONITRATE ER 30 MG PO TB24: 30.0000 mg | ORAL_TABLET | Freq: Every day | ORAL | Status: DC

## 2015-02-15 DIAGNOSIS — K219 Gastro-esophageal reflux disease without esophagitis: Secondary | ICD-10-CM | POA: Insufficient documentation

## 2015-03-08 ENCOUNTER — Ambulatory Visit (HOSPITAL_BASED_OUTPATIENT_CLINIC_OR_DEPARTMENT_OTHER): Payer: Medicare Other | Attending: Cardiovascular Disease

## 2015-03-08 VITALS — Ht 63.5 in | Wt 219.0 lb

## 2015-03-08 DIAGNOSIS — G4733 Obstructive sleep apnea (adult) (pediatric): Secondary | ICD-10-CM

## 2015-03-08 DIAGNOSIS — G4710 Hypersomnia, unspecified: Secondary | ICD-10-CM | POA: Insufficient documentation

## 2015-03-08 DIAGNOSIS — R0683 Snoring: Secondary | ICD-10-CM | POA: Diagnosis not present

## 2015-03-09 NOTE — Sleep Study (Signed)
Patient Name: Susan [name], Susan [name] Date: 03/08/2015 Gender: Female D.O.B: [birth year] Age (years): 89 Referring Provider: Shelva Majestic MD, ABSM Height (inches): 64 Interpreting Physician: Shelva Majestic MD, ABSM Weight (lbs): 219 RPSGT: Madelon Lips BMI: 38 MRN: [id number] Neck Size: 15.00  CLINICAL INFORMATION Sleep Study Type: Split Night CPAP Indication for sleep study: Snoring, Daytime sleepiness Epworth Sleepiness Score: 11  SLEEP STUDY TECHNIQUE As per the AASM Manual for the Scoring of Sleep and Associated Events v2.3 (April 2016) with a hypopnea requiring 4% desaturations. The channels recorded and monitored were frontal, central and occipital EEG, electrooculogram (EOG), submentalis EMG (chin), nasal and oral airflow, thoracic and abdominal wall motion, anterior tibialis EMG, snore microphone, electrocardiogram, and pulse oximetry. Continuous positive airway pressure (CPAP) was initiated when the patient met split night criteria and was titrated according to treat sleep-disordered breathing.  MEDICATIONS  acetaminophen (TYLENOL) 500 MG tablet 500 mg, Every 6 hours PRN     aspirin EC 81 MG tablet 81 mg, Daily     atorvastatin (LIPITOR) 80 MG tablet 80 mg, Daily-1800     BRILINTA 60 MG TABS tablet      ezetimibe (ZETIA) 10 MG tablet 10 mg, Daily     furosemide (LASIX) 20 MG tablet      isosorbide mononitrate (IMDUR) 30 MG 24 hr tablet 30 mg, Daily     losartan (COZAAR) 50 MG tablet 50 mg, Daily     Multiple Vitamin (MULTIVITAMIN WITH MINERALS) TABS 1 tablet, Daily     nitroGLYCERIN (NITROSTAT) 0.4 MG SL tablet 0.4 mg, Every 5 min x3 PRN     omeprazole (PRILOSEC) 20 MG capsule 20 mg, Daily     Vitamin D, Cholecalciferol, 1000    Medications administered by patient during sleep study : No sleep medicine administered.  RESPIRATORY PARAMETERS Diagnostic Total AHI (/hr): 35.8 RDI (/hr): 36.6 OA Index (/hr): - CA Index (/hr): 0.0 REM AHI (/hr): 67.5 NREM AHI  (/hr): 30.5 Supine AHI (/hr): N/A Non-supine AHI (/hr): 35.84 Min O2 Sat (%): 77.00 Mean O2 (%): 91.24 Time below 88% (min): 14.2   Titration Optimal Pressure (cm): 8 AHI at Optimal Pressure (/hr): 1.4 Min O2 at Optimal Pressure (%): 90.0 Supine % at Optimal (%): 100 Sleep % at Optimal (%): 89    SLEEP ARCHITECTURE The recording time for the entire night was 392.1 minutes. During a baseline period of 177.6 minutes, the patient slept for 154.0 minutes in REM and nonREM, yielding a sleep efficiency of 86.7%. Wake after sleep onset (WASO) was 18 minutes. Sleep onset after lights out was 5.6 minutes with a REM latency of 84.0 minutes. The patient spent 7.14% of the night in stage N1 sleep, 70.45% in stage N2 sleep, 6.82% in stage N3 and 15.58% in REM.   During the titration period of 204.9 minutes, the patient slept for 169.5 minutes in REM and nonREM, yielding a sleep efficiency of 82.7%. Sleep onset after CPAP initiation was 11.9 minutes with a REM latency of 62.0 minutes. The patient spent 6.49% of the night in stage N1 sleep, 67.85% in stage N2 sleep, 2.65% in stage N3 and 23.01% in REM.  CARDIAC DATA The 2 lead EKG demonstrated sinus rhythm. The mean heart rate was 54.02 beats per minute. Other EKG findings include: None.  LEG MOVEMENT DATA The total Periodic Limb Movements of Sleep (PLMS) were 0. The PLMS index was 0.00 .  IMPRESSIONS - Severe obstructive sleep apnea with an AHI  of 35.8/hour overall, with events more  severe during REM sleep with a REM sleep AHI of  67.5/hr.   - No significant central sleep apnea occurred during the diagnostic portion of the study (CAI = 0.0/hour). - Significant oxygen desaturation was noted during the diagnostic portion of the study to a nadir of 77.00%. - The patient snored with Loud snoring volume during the diagnostic portion of the study. - No cardiac abnormalities were noted during this study. - Clinically significant periodic limb movements did not  occur during sleep.  DIAGNOSIS - Obstructive Sleep Apnea (327.23 [G47.33 ICD-10])  RECOMMENDATIONS - Although the patient was titrated only up to 8 cm water pressure, due to mild residual snoring in the supine position, recommend an initial trial of CPAP therapy with EPR of 3 at 9 cm H2O pressure with heated humidification and mask of choice. A medium size F&P nasal mask was used for the study. - Avoid alcohol, sedatives and other CNS depressants that may worsen sleep apnea and disrupt normal sleep architecture. - Sleep hygiene should be reviewed to assess factors that may improve sleep quality. - Weight management (BMI 38) and regular exercise should be initiated or continued. - Recommend download in 30 days and sleep clinic evaluation.   Troy Sine, MD, [place], American Board of Sleep Medicine  ELECTRONICALLY SIGNED ON:  03/09/2015, 11:12 AM [place] PH: [phone]   FX: [phone] [place]

## 2015-03-11 ENCOUNTER — Telehealth: Payer: Self-pay | Admitting: *Deleted

## 2015-03-11 NOTE — Telephone Encounter (Signed)
-----   Message from Troy Sine, MD sent at 03/09/2015 11:27 AM EST ----- Mariann Laster please set up with DME to start CPAP

## 2015-03-11 NOTE — Telephone Encounter (Signed)
Spoke with patient's husband. Gave him sleep study results and recommendations. He will inform patient. He was instructed have her to call me back if she has any questions or concerns. CPAP referral sent to [facility].

## 2015-03-11 NOTE — Progress Notes (Signed)
referal sent to [facility].

## 2015-03-24 ENCOUNTER — Inpatient Hospital Stay: Payer: Medicare Other

## 2015-03-24 ENCOUNTER — Inpatient Hospital Stay: Payer: Medicare Other | Attending: Obstetrics and Gynecology | Admitting: Obstetrics and Gynecology

## 2015-03-24 ENCOUNTER — Encounter: Payer: Self-pay | Admitting: Obstetrics and Gynecology

## 2015-03-24 VITALS — BP 145/86 | HR 60 | Temp 97.9°F | Resp 18 | Wt 214.4 lb

## 2015-03-24 DIAGNOSIS — I10 Essential (primary) hypertension: Secondary | ICD-10-CM | POA: Insufficient documentation

## 2015-03-24 DIAGNOSIS — C541 Malignant neoplasm of endometrium: Secondary | ICD-10-CM | POA: Diagnosis not present

## 2015-03-24 DIAGNOSIS — I2510 Atherosclerotic heart disease of native coronary artery without angina pectoris: Secondary | ICD-10-CM | POA: Insufficient documentation

## 2015-03-24 DIAGNOSIS — I209 Angina pectoris, unspecified: Secondary | ICD-10-CM | POA: Insufficient documentation

## 2015-03-24 DIAGNOSIS — Z79899 Other long term (current) drug therapy: Secondary | ICD-10-CM | POA: Insufficient documentation

## 2015-03-24 DIAGNOSIS — I252 Old myocardial infarction: Secondary | ICD-10-CM | POA: Insufficient documentation

## 2015-03-24 DIAGNOSIS — I255 Ischemic cardiomyopathy: Secondary | ICD-10-CM | POA: Insufficient documentation

## 2015-03-24 DIAGNOSIS — Z7982 Long term (current) use of aspirin: Secondary | ICD-10-CM | POA: Insufficient documentation

## 2015-03-24 NOTE — Progress Notes (Signed)
Assisted MD with pelvic exam

## 2015-03-24 NOTE — Progress Notes (Signed)
  Oncology Nurse Navigator Documentation  Referral date to RadOnc/MedOnc: 03/24/15 (03/24/15 1000) Navigator Encounter Type:  (Initial Gyn) (03/24/15 1000) Patient Visit Type:  (GYN Oncology) (03/24/15 1000)                    Time Spent with Patient: 15 (03/24/15 1000)   Met with patient in exam room along with her spouse. Introduced Therapist, nutritional as a resource to help make her care less complicated. Provided her with my contact information and encouraged her to call with any future questions or concerns.

## 2015-03-24 NOTE — Progress Notes (Signed)
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 P56 female who is seen in consultation from Dr. Lovie Macadamia for serous uterine cancer.  After Thanksgiving, for at least 7-10 days, she had noted dried blood on her pad. She did a U/A and took Cipro for 3 days but culture was negative for UTI. 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  She is still having the spotting. No itching or vaginal discharge. Denies pelvic pain.   Patient is on ticagrelor (Brilinta) for her h/o stent placement after MI in July 2014.  She is very functional and teaches water aerobics.  No chest pain or other cardiac symptoms.    Problem List: Patient Active Problem List   Diagnosis Date Noted  . Kidney stones 10/27/2013  . Hematuria 12/19/2012  . CAD (coronary artery disease)- elective CFX DES 10/24/12 for recurrent angina 10/25/2012  . Dyslipidemia 10/25/2012  . NSVT (nonsustained ventricular tachycardia) ([place]) 10/25/2012  . Bradycardia- tolerating low dose beta blocker 10/25/2012  . Cardiomyopathy, ischemic: EF 35-40% by echo post-MI 10/20/12 10/22/2012  . STEMI 10/19/12- LAD DES, and CFX DES 10/24/12 10/20/2012  . HTN (hypertension) 10/20/2012    Past Medical History: Past Medical History  Diagnosis Date  . Hypertension   . Coronary artery disease   . Hyperlipidemia   . NSVT (nonsustained ventricular tachycardia)   . Bradycardia     Past Surgical History: Past Surgical History  Procedure Laterality Date  . Left heart catheterization with coronary angiogram  10/19/2012    Procedure: LEFT HEART CATHETERIZATION WITH CORONARY ANGIOGRAM;  Surgeon: Troy Sine, MD;  Location: [facility] CATH LAB;  Service: Cardiovascular;;  . Percutaneous coronary stent intervention (pci-s)  10/19/2012    Procedure: PERCUTANEOUS CORONARY STENT INTERVENTION (PCI-S);   Surgeon: Troy Sine, MD;  Location: [facility] CATH LAB;  Service: Cardiovascular;;  . Percutaneous coronary stent intervention (pci-s) N/A 10/23/2012    Procedure: PERCUTANEOUS CORONARY STENT INTERVENTION (PCI-S);  Surgeon: Troy Sine, MD;  Location: [facility] CATH LAB;  Service: Cardiovascular;  Laterality: N/A;    Family History: Family History  Problem Relation Age of Onset  . Heart disease Mother   . Breast cancer Mother   . Heart disease Father   . Diabetes Father   . Diabetes Brother   . Stroke Brother     Social History: Social History   Social History  . Marital Status: Married    Spouse Name: N/A  . Number of Children: N/A  . Years of Education: N/A   Occupational History  . Not on file.   Social History Main Topics  . Smoking status: Never Smoker   . Smokeless tobacco: Never Used  . Alcohol Use: 0.6 oz/week    1 Glasses of wine per week  . Drug Use: No  . Sexual Activity: Yes   Other Topics Concern  . Not on file   Social History Narrative    Allergies: Allergies  Allergen Reactions  . Penicillins Rash    Current Medications: Current Outpatient Prescriptions  Medication Sig Dispense Refill  . acetaminophen (TYLENOL) 500 MG tablet Take 500 mg by mouth every 6 (six) hours as needed.    [name] aspirin EC 81 MG tablet Take 81 mg by mouth daily.    [name] atorvastatin (LIPITOR) 80 MG tablet Take 1 tablet (80 mg total) by mouth daily at 6 PM.  90 tablet 0  . BRILINTA 60 MG TABS tablet TAKE 1 TABLET (60 MG TOTAL) BY MOUTH 2 (TWO) TIMES DAILY. 60 tablet 5  . ezetimibe (ZETIA) 10 MG tablet Take 1 tablet (10 mg total) by mouth daily. 30 tablet 11  . furosemide (LASIX) 20 MG tablet TAKE 1 TABLET BY MOUTH DAILY. 90 tablet 2  . isosorbide mononitrate (IMDUR) 30 MG 24 hr tablet Take 1 tablet (30 mg total) by mouth daily. 30 tablet 11  . losartan (COZAAR) 50 MG tablet Take 1 tablet (50 mg total) by mouth daily. 90 tablet 3  . Multiple Vitamin (MULTIVITAMIN WITH MINERALS) TABS Take  1 tablet by mouth daily.    . nitroGLYCERIN (NITROSTAT) 0.4 MG SL tablet Place 1 tablet (0.4 mg total) under the tongue every 5 (five) minutes x 3 doses as needed for chest pain. 25 tablet 3  . omeprazole (PRILOSEC) 20 MG capsule Take 20 mg by mouth daily.    . Vitamin D, Cholecalciferol, 1000 UNITS CAPS Take 1 capsule by mouth daily.     No current facility-administered medications for this visit.    Review of Systems General: negative for, fevers, chills, fatigue, changes in sleep, changes in weight or appetite Skin: negative for changes in color, texture, moles or lesions Eyes: negative for, changes in vision, pain, diplopia HEENT: negative for, change in hearing, pain, discharge, tinnitus, vertigo, voice changes, sore throat, neck masses Breasts: negative for breast lumps Pulmonary: negative for, dyspnea, orthopnea, productive cough Cardiac: negative for, palpitations, syncope, pain, discomfort, pressure Gastrointestinal: negative for, dysphagia, nausea, vomiting, jaundice, pain, constipation, diarrhea, hematemesis, hematochezia Genitourinary/Sexual: negative for, dysuria, discharge, hesitancy, nocturia, retention, stones, infections, STD's, incontinence Ob/Gyn: negative for pain Musculoskeletal: negative for, pain, stiffness, swelling, range of motion limitation Hematology: No coagulation disorder, negative for, easy bruising, bleeding Neurologic/Psych: negative for, headaches, seizures, paralysis, weakness, tremor, change in gait, change in sensation, mood swings, depression, anxiety, change in memory  Objective:  Physical Examination:  BP 145/86 mmHg  Pulse 60  Temp(Src) 97.9 F (36.6 C) (Oral)  Resp 18  Wt 214 lb 6.4 oz (97.25 kg)   ECOG Performance Status: 0 - Asymptomatic  General appearance: alert, cooperative and appears stated age HEENT:PERRLA, neck supple with midline trachea and thyroid without masses Lymph node survey: non-palpable, axillary, inguinal,  supraclavicular Cardiovascular: regular rate and rhythm, no murmurs or gallops Respiratory: normal air entry, lungs clear to auscultation and no rales, rhonchi or wheezing Abdomen: soft, non-tender, without masses or organomegaly, no hernias and well healed incision Back: inspection of back is normal Extremities: extremities normal, atraumatic, no cyanosis or edema Skin exam - normal coloration and turgor, no rashes, no suspicious skin lesions noted. Neurological exam reveals alert, oriented, normal speech, no focal findings or movement disorder noted.  Pelvic: exam chaperoned by nurse;  Vulva: normal appearing vulva with no masses, tenderness or lesions; Vagina: normal vagina; Adnexa: normal adnexa in size, nontender and no masses; Uterus: uterus is normal size, shape, consistency and nontender; Cervix: no lesions; Rectal: not indicated and normal rectal, no masses    Assessment:  Susan [name] is a 80 y.o. female diagnosed with clinical stage I serous uterine cancer.  No evidence of metastatic disease on exam, but this is an aggressive histological type.    Ischemic heart disease with cornonary stents on Brilinta, but good functional status.  Problem List Items Addressed This Visit      Genitourinary   Endometrial cancer [facility]) - Primary   Relevant Orders  CT Abdomen Pelvis W Contrast     We discussed options for management including TLH/BSO and surgical staging for serous endometrial cancer.  Because this histology has a higher risk of metastasis we will order a pre op CT scan of the abdomen and pelvis to be done at [facility] in the next few days.  She will see me in clinic next Friday Jan 6 and will be scheduled for surgery the following week on 1/12.  Her  Brilinta needs to be discontinued 5 days prior to surgery.  A total of 60 minutes were spent with the patient/family today; 25% was spent in education, counseling and coordination of care for endometrial cancer.    Mellody Drown,  MD    CC:  [name], MD [facility] [address] [place], [place] [address] [phone]

## 2015-03-30 ENCOUNTER — Ambulatory Visit
Admission: RE | Admit: 2015-03-30 | Discharge: 2015-03-30 | Disposition: A | Discharge status: Home / Self Care | Payer: Medicare Other | Source: Ambulatory Visit | Attending: Obstetrics and Gynecology | Admitting: Obstetrics and Gynecology

## 2015-03-30 DIAGNOSIS — C541 Malignant neoplasm of endometrium: Secondary | ICD-10-CM | POA: Diagnosis not present

## 2015-03-30 DIAGNOSIS — I2510 Atherosclerotic heart disease of native coronary artery without angina pectoris: Secondary | ICD-10-CM | POA: Diagnosis not present

## 2015-03-30 DIAGNOSIS — M5136 Other intervertebral disc degeneration, lumbar region: Secondary | ICD-10-CM | POA: Insufficient documentation

## 2015-03-30 LAB — POCT I-STAT CREATININE: CREATININE: 1 mg/dL (ref 0.44–1.00)

## 2015-03-30 MED ORDER — IOHEXOL 300 MG/ML  SOLN
100.0000 mL | Freq: Once | INTRAMUSCULAR | Status: AC | PRN
  Administered 2015-03-30: 100 mL via INTRAVENOUS

## 2015-04-02 DIAGNOSIS — C541 Malignant neoplasm of endometrium: Secondary | ICD-10-CM | POA: Insufficient documentation

## 2015-04-05 ENCOUNTER — Other Ambulatory Visit: Payer: Self-pay | Admitting: Cardiovascular Disease

## 2015-04-05 NOTE — Telephone Encounter (Signed)
Rx request sent to pharmacy.  

## 2015-04-08 DIAGNOSIS — Z9889 Other specified postprocedural states: Secondary | ICD-10-CM | POA: Insufficient documentation

## 2015-04-09 ENCOUNTER — Telehealth: Payer: Self-pay | Admitting: Cardiovascular Disease

## 2015-04-09 NOTE — Telephone Encounter (Signed)
Dr. Jimmye Norman is calling to see when Susan [name] can start her Brilinta post hospital , please call    Thanks

## 2015-04-09 NOTE — Telephone Encounter (Signed)
Dr. Claiborne Billings is out on vacation until Monday 1/16 Discussed when patient can start back on her Brilanta after surgery (Laposcopy hysterectomy with bilateral salpingoectomy) with Dr. Stanford Breed. Returned call to Dr. Lindaann Slough,( [phone]) patients surgeon at [facility] and informed her that she could resume the [facility] as long as she has no active bleeding

## 2015-04-09 NOTE — Telephone Encounter (Signed)
  The number ot call is Dr. Lindaann Slough is [phone]  Thanks             Romana Juniper at 04/09/2015 8:03 AM     Status: Signed        Dr. Jimmye Norman is calling to see when Mrs. Reineck can start her Brilinta post hospital , please call    Thanks

## 2015-04-09 NOTE — Telephone Encounter (Signed)
The number ot call is Dr. Lindaann Slough is [phone]  Thanks

## 2015-04-21 ENCOUNTER — Inpatient Hospital Stay: Payer: Medicare Other | Attending: Obstetrics and Gynecology | Admitting: Obstetrics and Gynecology

## 2015-04-21 VITALS — BP 139/82 | HR 61 | Temp 97.4°F | Resp 18 | Wt 211.1 lb

## 2015-04-21 DIAGNOSIS — C541 Malignant neoplasm of endometrium: Secondary | ICD-10-CM

## 2015-04-21 NOTE — Progress Notes (Signed)
Gynecologic Oncology Consult Visit   Referring Provider: Dr Joylene Igo  Chief Concern: stage IIIC1serous endometrial cancer  Subjective:  Susan [name] is a 81 y.o. P47 female who is seen in consultation from Dr. Lovie Macadamia for serous uterine cancer.  In 11/16 noted dried blood on her pad. 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  Patient is on ticagrelor (Brilinta) for her h/o stent placement after MI in July 2014.   Patient underwent TLH/BSO and staging 04/08/15. Path report below shows stage IIIC1 disease.  Washings were negative, but she had 3 positive SLNs in pelvis.  An aortic SLN was negative.    She has no complaints today.   A. Lymph node, sentinel, right external iliac, excision: Minute focus of metastatic carcinoma involving one lymph node (1/1). See note. Size of metastasis: Less than 0.1 mm. Negative for extranodal invasion.  Note: A cytokeratin AE1/3 highlights the metastatic focus.  B. Lymph node, sentinel, right external iliac #2, excision: Metastatic carcinoma involving one lymph node (1/1). See note. Size of metastasis: 0.5 mm. Negative for extranodal invasion. Note: A cytokeratin AE1/3 highlights the metastatic focus.  C. Lymph node, sentinel, right aortic, excision: One benign lymph node (0/1). Note: A cytokeratin AE1/3 immunostain is negative.  D. Lymph node, sentinel, left external iliac, excision:  Metastatic adenocarcinoma involving one lymph node (1/1).   Size of metastasis: 1 cm. Negative for extranodal invasion. Note: A cytokeratin AE1/3 highlights the metastatic focus.  E. 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 Tumor size: 5.1 cm Extent of invasion: The tumor invades 2 mm into a 14 mm thick myometrium  Negative for cervical  involvement. Lymphovascular invasion is present.  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 Endometrial cancer ([place]) 03/24/2015  . Kidney stones 10/27/2013  . Hematuria 12/19/2012  . CAD (coronary artery disease)- elective CFX DES 10/24/12 for recurrent angina 10/25/2012  . Dyslipidemia 10/25/2012  . NSVT (nonsustained ventricular tachycardia) ([place]) 10/25/2012  . Bradycardia- tolerating low dose beta blocker 10/25/2012  . Cardiomyopathy, ischemic: EF 35-40% by echo post-MI 10/20/12 10/22/2012  . STEMI 10/19/12- LAD DES, and CFX DES 10/24/12 10/20/2012  . HTN (hypertension) 10/20/2012    Past Medical History: Past Medical History  Diagnosis Date  . Hypertension   . Coronary artery disease   . Hyperlipidemia   . NSVT (nonsustained ventricular tachycardia) ([place])   . Bradycardia   . Uterine cancer ([place])     serrous    Past Surgical History: Past Surgical History  Procedure Laterality Date  . Left heart catheterization with coronary angiogram  10/19/2012    Procedure: LEFT HEART CATHETERIZATION WITH CORONARY ANGIOGRAM;  Surgeon: Troy Sine, MD;  Location: [facility] CATH LAB;  Service: Cardiovascular;;  . Percutaneous coronary stent intervention (pci-s)  10/19/2012    Procedure: PERCUTANEOUS CORONARY STENT INTERVENTION (PCI-S);  Surgeon: Troy Sine, MD;  Location: [facility] CATH LAB;  Service: Cardiovascular;;  . Percutaneous coronary stent intervention (pci-s) N/A 10/23/2012    Procedure: PERCUTANEOUS CORONARY STENT INTERVENTION (PCI-S);  Surgeon: Troy Sine, MD;  Location: [facility] CATH LAB;  Service: Cardiovascular;  Laterality: N/A;    Family History: Family History  Problem Relation Age of Onset  . Heart disease Mother   . Breast cancer Mother   .  Heart disease Father   . Diabetes Father   . Diabetes  Brother   . Stroke Brother     Social History: Social History   Social History  . Marital Status: Married    Spouse Name: N/A  . Number of Children: N/A  . Years of Education: N/A   Occupational History  . Not on file.   Social History Main Topics  . Smoking status: Never Smoker   . Smokeless tobacco: Never Used  . Alcohol Use: 0.6 oz/week    1 Glasses of wine per week  . Drug Use: No  . Sexual Activity: Yes   Other Topics Concern  . Not on file   Social History Narrative    Allergies: Allergies  Allergen Reactions  . Penicillins Rash    Current Medications: Current Outpatient Prescriptions  Medication Sig Dispense Refill  . acetaminophen (TYLENOL) 500 MG tablet Take 500 mg by mouth every 6 (six) hours as needed.    [name] aspirin EC 81 MG tablet Take 81 mg by mouth daily.    [name] atorvastatin (LIPITOR) 80 MG tablet TAKE 1 TABLET BY MOUTH AT 6PM 30 tablet 0  . BRILINTA 60 MG TABS tablet TAKE 1 TABLET (60 MG TOTAL) BY MOUTH 2 (TWO) TIMES DAILY. 60 tablet 5  . ezetimibe (ZETIA) 10 MG tablet Take 1 tablet (10 mg total) by mouth daily. 30 tablet 11  . furosemide (LASIX) 20 MG tablet TAKE 1 TABLET BY MOUTH DAILY. 90 tablet 2  . isosorbide mononitrate (IMDUR) 30 MG 24 hr tablet Take 1 tablet (30 mg total) by mouth daily. 30 tablet 11  . losartan (COZAAR) 50 MG tablet Take 1 tablet (50 mg total) by mouth daily. 90 tablet 3  . Multiple Vitamin (MULTIVITAMIN WITH MINERALS) TABS Take 1 tablet by mouth daily.    . nitroGLYCERIN (NITROSTAT) 0.4 MG SL tablet Place 1 tablet (0.4 mg total) under the tongue every 5 (five) minutes x 3 doses as needed for chest pain. 25 tablet 3  . omeprazole (PRILOSEC) 20 MG capsule Take 20 mg by mouth daily.    . Vitamin D, Cholecalciferol, 1000 UNITS CAPS Take 1 capsule by mouth daily.     No current facility-administered medications for this visit.    Review of Systems General: negative for, fevers, chills, fatigue, changes in sleep, changes in  weight or appetite Skin: negative for changes in color, texture, moles or lesions Eyes: negative for, changes in vision, pain, diplopia HEENT: negative for, change in hearing, pain, discharge, tinnitus, vertigo, voice changes, sore throat, neck masses Breasts: negative for breast lumps Pulmonary: negative for, dyspnea, orthopnea, productive cough Cardiac: negative for, palpitations, syncope, pain, discomfort, pressure Gastrointestinal: negative for, dysphagia, nausea, vomiting, jaundice, pain, constipation, diarrhea, hematemesis, hematochezia Genitourinary/Sexual: negative for, dysuria, discharge, hesitancy, nocturia, retention, stones, infections, STD's, incontinence Ob/Gyn: negative for pain Musculoskeletal: negative for, pain, stiffness, swelling, range of motion limitation Hematology: No coagulation disorder, negative for, easy bruising, bleeding Neurologic/Psych: negative for, headaches, seizures, paralysis, weakness, tremor, change in gait, change in sensation, mood swings, depression, anxiety, change in memory  Objective:  Physical Examination:  BP 139/82 mmHg  Pulse 61  Temp(Src) 97.4 F (36.3 C) (Tympanic)  Resp 18  Wt 211 lb 1.5 oz (95.75 kg)   ECOG Performance Status: 0 - Asymptomatic  General appearance: alert, cooperative and appears stated age HEENT:PERRLA, neck supple with midline trachea and thyroid without masses Lymph node survey: non-palpable, axillary, inguinal, supraclavicular Cardiovascular: regular rate and rhythm, no murmurs or  gallops Respiratory: normal air entry, lungs clear to auscultation and no rales, rhonchi or wheezing Abdomen: well healed incisions, nontender Back: inspection of back is normal Extremities: extremities normal, atraumatic, no cyanosis or edema Skin exam - normal coloration and turgor, no rashes, no suspicious skin lesions noted. Neurological exam reveals alert, oriented, normal speech, no focal findings or movement disorder  noted.  Pelvic: exam chaperoned by nurse;  Vulva: normal appearing vulva with no masses, tenderness or lesions; Vagina: normal vagina; Adnexa: normal adnexa in size, nontender and no masses; Uterus: uterus is normal size, shape, consistency and nontender; Cervix: no lesions; Rectal: not indicated and normal rectal, no masses    Assessment:  Susan [name] is a 81 y.o. 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  She has three positive pelvic sentinel nodes and negative right aortic SLN and washings.  No cervical or adnexal involvement. Normal post op recovery.  Ischemic heart disease with cornonary stents on Brilinta, but good functional status.  Problem List Items Addressed This Visit      Genitourinary   Endometrial cancer [facility]) - Primary     Discussed path report with patient and her husband and the need for adjuvant therapy.  Recommend sandwich therapy with 3 cycles of carboplatin/taxol, then pelvic RT followed by an additional 3 cycles of chemotherapy.   Dr Oliva Bustard will meet with her today to discuss initiation of chemotherapy.  Mellody Drown, MD  CC:  [name], MD [facility] [address] [place], [place] [address] [phone]

## 2015-04-26 ENCOUNTER — Telehealth: Payer: Self-pay | Admitting: Cardiovascular Disease

## 2015-04-26 NOTE — Telephone Encounter (Signed)
New Message  Pt requested to speak w/ RN to discuss upcoming chemotherapy pt has- and its affect on past hx of stints and current meds. Pt stated it would be best to call home # after 2pm today. .Please call back and discuss.

## 2015-04-27 NOTE — Telephone Encounter (Signed)
F/u    Pt requesting call back today from previous message.

## 2015-04-27 NOTE — Telephone Encounter (Signed)
PHONE  RANG  TWICE  THEN  MADE  BUSY SIGNAL  SOUND  WILL TRY LATER .Susan [name]

## 2015-04-28 ENCOUNTER — Inpatient Hospital Stay: Payer: Medicare Other

## 2015-04-28 ENCOUNTER — Inpatient Hospital Stay: Payer: Medicare Other | Attending: Oncology | Admitting: Oncology

## 2015-04-28 VITALS — BP 164/70 | HR 58 | Temp 95.8°F | Resp 18 | Wt 209.4 lb

## 2015-04-28 DIAGNOSIS — C541 Malignant neoplasm of endometrium: Secondary | ICD-10-CM | POA: Diagnosis not present

## 2015-04-28 DIAGNOSIS — I10 Essential (primary) hypertension: Secondary | ICD-10-CM | POA: Insufficient documentation

## 2015-04-28 DIAGNOSIS — Z7689 Persons encountering health services in other specified circumstances: Secondary | ICD-10-CM | POA: Insufficient documentation

## 2015-04-28 DIAGNOSIS — R5383 Other fatigue: Secondary | ICD-10-CM | POA: Diagnosis not present

## 2015-04-28 DIAGNOSIS — I252 Old myocardial infarction: Secondary | ICD-10-CM | POA: Diagnosis not present

## 2015-04-28 DIAGNOSIS — Z79899 Other long term (current) drug therapy: Secondary | ICD-10-CM | POA: Diagnosis not present

## 2015-04-28 DIAGNOSIS — R011 Cardiac murmur, unspecified: Secondary | ICD-10-CM | POA: Diagnosis not present

## 2015-04-28 DIAGNOSIS — R531 Weakness: Secondary | ICD-10-CM | POA: Insufficient documentation

## 2015-04-28 DIAGNOSIS — Z5111 Encounter for antineoplastic chemotherapy: Secondary | ICD-10-CM | POA: Diagnosis not present

## 2015-04-28 DIAGNOSIS — Z7901 Long term (current) use of anticoagulants: Secondary | ICD-10-CM | POA: Diagnosis not present

## 2015-04-28 DIAGNOSIS — I2510 Atherosclerotic heart disease of native coronary artery without angina pectoris: Secondary | ICD-10-CM | POA: Diagnosis not present

## 2015-04-28 DIAGNOSIS — E785 Hyperlipidemia, unspecified: Secondary | ICD-10-CM | POA: Diagnosis not present

## 2015-04-28 DIAGNOSIS — Z95818 Presence of other cardiac implants and grafts: Secondary | ICD-10-CM | POA: Insufficient documentation

## 2015-04-28 DIAGNOSIS — Z90710 Acquired absence of both cervix and uterus: Secondary | ICD-10-CM | POA: Insufficient documentation

## 2015-04-28 DIAGNOSIS — G8918 Other acute postprocedural pain: Secondary | ICD-10-CM | POA: Diagnosis not present

## 2015-04-28 DIAGNOSIS — Z90722 Acquired absence of ovaries, bilateral: Secondary | ICD-10-CM | POA: Insufficient documentation

## 2015-04-28 DIAGNOSIS — Z7982 Long term (current) use of aspirin: Secondary | ICD-10-CM | POA: Diagnosis not present

## 2015-04-28 LAB — CBC WITH DIFFERENTIAL/PLATELET
BASOS PCT: 1 %
Basophils Absolute: 0.1 10*3/uL (ref 0–0.1)
EOS ABS: 0.3 10*3/uL (ref 0–0.7)
Eosinophils Relative: 4 %
HEMATOCRIT: 40 % (ref 35.0–47.0)
Hemoglobin: 13.7 g/dL (ref 12.0–16.0)
Lymphocytes Relative: 22 %
Lymphs Abs: 1.5 10*3/uL (ref 1.0–3.6)
MCH: 30.6 pg (ref 26.0–34.0)
MCHC: 34.4 g/dL (ref 32.0–36.0)
MCV: 88.9 fL (ref 80.0–100.0)
MONO ABS: 0.6 10*3/uL (ref 0.2–0.9)
MONOS PCT: 9 %
NEUTROS ABS: 4.4 10*3/uL (ref 1.4–6.5)
Neutrophils Relative %: 64 %
Platelets: 288 10*3/uL (ref 150–440)
RBC: 4.5 MIL/uL (ref 3.80–5.20)
RDW: 13.3 % (ref 11.5–14.5)
WBC: 6.8 10*3/uL (ref 3.6–11.0)

## 2015-04-28 LAB — COMPREHENSIVE METABOLIC PANEL
ALBUMIN: 4.2 g/dL (ref 3.5–5.0)
ALT: 14 U/L (ref 14–54)
ANION GAP: 6 (ref 5–15)
AST: 21 U/L (ref 15–41)
Alkaline Phosphatase: 91 U/L (ref 38–126)
BILIRUBIN TOTAL: 0.4 mg/dL (ref 0.3–1.2)
BUN: 20 mg/dL (ref 6–20)
CO2: 26 mmol/L (ref 22–32)
Calcium: 9.4 mg/dL (ref 8.9–10.3)
Chloride: 103 mmol/L (ref 101–111)
Creatinine, Ser: 0.89 mg/dL (ref 0.44–1.00)
GLUCOSE: 99 mg/dL (ref 65–99)
POTASSIUM: 3.7 mmol/L (ref 3.5–5.1)
Sodium: 135 mmol/L (ref 135–145)
TOTAL PROTEIN: 7.2 g/dL (ref 6.5–8.1)

## 2015-04-28 LAB — MAGNESIUM: MAGNESIUM: 2.3 mg/dL (ref 1.7–2.4)

## 2015-04-28 NOTE — Progress Notes (Signed)
Patient here today as new evaluation.  Referred by Dr. Fransisca Connors.  Patient is 3 weeks post hysterectomy.

## 2015-04-29 LAB — CA 125: CA 125: 82.5 U/mL — ABNORMAL HIGH (ref 0.0–38.1)

## 2015-04-29 NOTE — Patient Instructions (Signed)
Paclitaxel injection What is this medicine? PACLITAXEL (PAK li TAX el) is a chemotherapy drug. It targets fast dividing cells, like cancer cells, and causes these cells to die. This medicine is used to treat ovarian cancer, breast cancer, and other cancers. This medicine may be used for other purposes; ask your health care provider or pharmacist if you have questions. What should I tell my health care provider before I take this medicine? They need to know if you have any of these conditions: -blood disorders -irregular heartbeat -infection (especially a virus infection such as chickenpox, cold sores, or herpes) -liver disease -previous or ongoing radiation therapy -an unusual or allergic reaction to paclitaxel, alcohol, polyoxyethylated castor oil, other chemotherapy agents, other medicines, foods, dyes, or preservatives -pregnant or trying to get pregnant -breast-feeding How should I use this medicine? This drug is given as an infusion into a vein. It is administered in a hospital or clinic by a specially trained health care professional. Talk to your pediatrician regarding the use of this medicine in children. Special care may be needed. Overdosage: If you think you have taken too much of this medicine contact a poison control center or emergency room at once. NOTE: This medicine is only for you. Do not share this medicine with others. What if I miss a dose? It is important not to miss your dose. Call your doctor or health care professional if you are unable to keep an appointment. What may interact with this medicine? Do not take this medicine with any of the following medications: -disulfiram -metronidazole This medicine may also interact with the following medications: -cyclosporine -diazepam -ketoconazole -medicines to increase blood counts like filgrastim, pegfilgrastim, sargramostim -other chemotherapy drugs like cisplatin, doxorubicin, epirubicin, etoposide, teniposide,  vincristine -quinidine -testosterone -vaccines -verapamil Talk to your doctor or health care professional before taking any of these medicines: -acetaminophen -aspirin -ibuprofen -ketoprofen -naproxen This list may not describe all possible interactions. Give your health care provider a list of all the medicines, herbs, non-prescription drugs, or dietary supplements you use. Also tell them if you smoke, drink alcohol, or use illegal drugs. Some items may interact with your medicine. What should I watch for while using this medicine? Your condition will be monitored carefully while you are receiving this medicine. You will need important blood work done while you are taking this medicine. This drug may make you feel generally unwell. This is not uncommon, as chemotherapy can affect healthy cells as well as cancer cells. Report any side effects. Continue your course of treatment even though you feel ill unless your doctor tells you to stop. This medicine can cause serious allergic reactions. To reduce your risk you will need to take other medicine(s) before treatment with this medicine. In some cases, you may be given additional medicines to help with side effects. Follow all directions for their use. Call your doctor or health care professional for advice if you get a fever, chills or sore throat, or other symptoms of a cold or flu. Do not treat yourself. This drug decreases your body's ability to fight infections. Try to avoid being around people who are sick. This medicine may increase your risk to bruise or bleed. Call your doctor or health care professional if you notice any unusual bleeding. Be careful brushing and flossing your teeth or using a toothpick because you may get an infection or bleed more easily. If you have any dental work done, tell your dentist you are receiving this medicine. Avoid taking products that   contain aspirin, acetaminophen, ibuprofen, naproxen, or ketoprofen unless  instructed by your doctor. These medicines may hide a fever. Do not become pregnant while taking this medicine. Women should inform their doctor if they wish to become pregnant or think they might be pregnant. There is a potential for serious side effects to an unborn child. Talk to your health care professional or pharmacist for more information. Do not breast-feed an infant while taking this medicine. Men are advised not to father a child while receiving this medicine. This product may contain alcohol. Ask your pharmacist or healthcare provider if this medicine contains alcohol. Be sure to tell all healthcare providers you are taking this medicine. Certain medicines, like metronidazole and disulfiram, can cause an unpleasant reaction when taken with alcohol. The reaction includes flushing, headache, nausea, vomiting, sweating, and increased thirst. The reaction can last from 30 minutes to several hours. What side effects may I notice from receiving this medicine? Side effects that you should report to your doctor or health care professional as soon as possible: -allergic reactions like skin rash, itching or hives, swelling of the face, lips, or tongue -low blood counts - This drug may decrease the number of white blood cells, red blood cells and platelets. You may be at increased risk for infections and bleeding. -signs of infection - fever or chills, cough, sore throat, pain or difficulty passing urine -signs of decreased platelets or bleeding - bruising, pinpoint red spots on the skin, black, tarry stools, nosebleeds -signs of decreased red blood cells - unusually weak or tired, fainting spells, lightheadedness -breathing problems -chest pain -high or low blood pressure -mouth sores -nausea and vomiting -pain, swelling, redness or irritation at the injection site -pain, tingling, numbness in the hands or feet -slow or irregular heartbeat -swelling of the ankle, feet, hands Side effects that  usually do not require medical attention (report to your doctor or health care professional if they continue or are bothersome): -bone pain -complete hair loss including hair on your head, underarms, pubic hair, eyebrows, and eyelashes -changes in the color of fingernails -diarrhea -loosening of the fingernails -loss of appetite -muscle or joint pain -red flush to skin -sweating This list may not describe all possible side effects. Call your doctor for medical advice about side effects. You may report side effects to FDA at 1-800-FDA-1088. Where should I keep my medicine? This drug is given in a hospital or clinic and will not be stored at home. NOTE: This sheet is a summary. It may not cover all possible information. If you have questions about this medicine, talk to your doctor, pharmacist, or health care provider.    2016, [employer]. (2014-10-29 13:02:56) Carboplatin injection What is this medicine? CARBOPLATIN (KAR boe pla tin) is a chemotherapy drug. It targets fast dividing cells, like cancer cells, and causes these cells to die. This medicine is used to treat ovarian cancer and many other cancers. This medicine may be used for other purposes; ask your health care provider or pharmacist if you have questions. What should I tell my health care provider before I take this medicine? They need to know if you have any of these conditions: -blood disorders -hearing problems -kidney disease -recent or ongoing radiation therapy -an unusual or allergic reaction to carboplatin, cisplatin, other chemotherapy, other medicines, foods, dyes, or preservatives -pregnant or trying to get pregnant -breast-feeding How should I use this medicine? This drug is usually given as an infusion into a vein. It is administered in a   hospital or clinic by a specially trained health care professional. Talk to your pediatrician regarding the use of this medicine in children. Special care may be  needed. Overdosage: If you think you have taken too much of this medicine contact a poison control center or emergency room at once. NOTE: This medicine is only for you. Do not share this medicine with others. What if I miss a dose? It is important not to miss a dose. Call your doctor or health care professional if you are unable to keep an appointment. What may interact with this medicine? -medicines for seizures -medicines to increase blood counts like filgrastim, pegfilgrastim, sargramostim -some antibiotics like amikacin, gentamicin, neomycin, streptomycin, tobramycin -vaccines Talk to your doctor or health care professional before taking any of these medicines: -acetaminophen -aspirin -ibuprofen -ketoprofen -naproxen This list may not describe all possible interactions. Give your health care provider a list of all the medicines, herbs, non-prescription drugs, or dietary supplements you use. Also tell them if you smoke, drink alcohol, or use illegal drugs. Some items may interact with your medicine. What should I watch for while using this medicine? Your condition will be monitored carefully while you are receiving this medicine. You will need important blood work done while you are taking this medicine. This drug may make you feel generally unwell. This is not uncommon, as chemotherapy can affect healthy cells as well as cancer cells. Report any side effects. Continue your course of treatment even though you feel ill unless your doctor tells you to stop. In some cases, you may be given additional medicines to help with side effects. Follow all directions for their use. Call your doctor or health care professional for advice if you get a fever, chills or sore throat, or other symptoms of a cold or flu. Do not treat yourself. This drug decreases your body's ability to fight infections. Try to avoid being around people who are sick. This medicine may increase your risk to bruise or bleed.  Call your doctor or health care professional if you notice any unusual bleeding. Be careful brushing and flossing your teeth or using a toothpick because you may get an infection or bleed more easily. If you have any dental work done, tell your dentist you are receiving this medicine. Avoid taking products that contain aspirin, acetaminophen, ibuprofen, naproxen, or ketoprofen unless instructed by your doctor. These medicines may hide a fever. Do not become pregnant while taking this medicine. Women should inform their doctor if they wish to become pregnant or think they might be pregnant. There is a potential for serious side effects to an unborn child. Talk to your health care professional or pharmacist for more information. Do not breast-feed an infant while taking this medicine. What side effects may I notice from receiving this medicine? Side effects that you should report to your doctor or health care professional as soon as possible: -allergic reactions like skin rash, itching or hives, swelling of the face, lips, or tongue -signs of infection - fever or chills, cough, sore throat, pain or difficulty passing urine -signs of decreased platelets or bleeding - bruising, pinpoint red spots on the skin, black, tarry stools, nosebleeds -signs of decreased red blood cells - unusually weak or tired, fainting spells, lightheadedness -breathing problems -changes in hearing -changes in vision -chest pain -high blood pressure -low blood counts - This drug may decrease the number of white blood cells, red blood cells and platelets. You may be at increased risk for infections and   bleeding. -nausea and vomiting -pain, swelling, redness or irritation at the injection site -pain, tingling, numbness in the hands or feet -problems with balance, talking, walking -trouble passing urine or change in the amount of urine Side effects that usually do not require medical attention (report to your doctor or health  care professional if they continue or are bothersome): -hair loss -loss of appetite -metallic taste in the mouth or changes in taste This list may not describe all possible side effects. Call your doctor for medical advice about side effects. You may report side effects to FDA at 1-800-FDA-1088. Where should I keep my medicine? This drug is given in a hospital or clinic and will not be stored at home. NOTE: This sheet is a summary. It may not cover all possible information. If you have questions about this medicine, talk to your doctor, pharmacist, or health care provider.    2016, [employer]. (2007-06-18 14:38:05) Pegfilgrastim injection What is this medicine? PEGFILGRASTIM (PEG fil gra stim) is a long-acting granulocyte colony-stimulating factor that stimulates the growth of neutrophils, a type of white blood cell important in the body's fight against infection. It is used to reduce the incidence of fever and infection in patients with certain types of cancer who are receiving chemotherapy that affects the bone marrow, and to increase survival after being exposed to high doses of radiation. This medicine may be used for other purposes; ask your health care provider or pharmacist if you have questions. What should I tell my health care provider before I take this medicine? They need to know if you have any of these conditions: -kidney disease -latex allergy -ongoing radiation therapy -sickle cell disease -skin reactions to acrylic adhesives (On-Body Injector only) -an unusual or allergic reaction to pegfilgrastim, filgrastim, other medicines, foods, dyes, or preservatives -pregnant or trying to get pregnant -breast-feeding How should I use this medicine? This medicine is for injection under the skin. If you get this medicine at home, you will be taught how to prepare and give the pre-filled syringe or how to use the On-body Injector. Refer to the patient Instructions for Use  for detailed instructions. Use exactly as directed. Take your medicine at regular intervals. Do not take your medicine more often than directed. It is important that you put your used needles and syringes in a special sharps container. Do not put them in a trash can. If you do not have a sharps container, call your pharmacist or healthcare provider to get one. Talk to your pediatrician regarding the use of this medicine in children. While this drug may be prescribed for selected conditions, precautions do apply. Overdosage: If you think you have taken too much of this medicine contact a poison control center or emergency room at once. NOTE: This medicine is only for you. Do not share this medicine with others. What if I miss a dose? It is important not to miss your dose. Call your doctor or health care professional if you miss your dose. If you miss a dose due to an On-body Injector failure or leakage, a new dose should be administered as soon as possible using a single prefilled syringe for manual use. What may interact with this medicine? Interactions have not been studied. Give your health care provider a list of all the medicines, herbs, non-prescription drugs, or dietary supplements you use. Also tell them if you smoke, drink alcohol, or use illegal drugs. Some items may interact with your medicine. This list may not describe all   possible interactions. Give your health care provider a list of all the medicines, herbs, non-prescription drugs, or dietary supplements you use. Also tell them if you smoke, drink alcohol, or use illegal drugs. Some items may interact with your medicine. What should I watch for while using this medicine? You may need blood work done while you are taking this medicine. If you are going to need a MRI, CT scan, or other procedure, tell your doctor that you are using this medicine (On-Body Injector only). What side effects may I notice from receiving this medicine? Side  effects that you should report to your doctor or health care professional as soon as possible: -allergic reactions like skin rash, itching or hives, swelling of the face, lips, or tongue -dizziness -fever -pain, redness, or irritation at site where injected -pinpoint red spots on the skin -red or dark-brown urine -shortness of breath or breathing problems -stomach or side pain, or pain at the shoulder -swelling -tiredness -trouble passing urine or change in the amount of urine Side effects that usually do not require medical attention (report to your doctor or health care professional if they continue or are bothersome): -bone pain -muscle pain This list may not describe all possible side effects. Call your doctor for medical advice about side effects. You may report side effects to FDA at 1-800-FDA-1088. Where should I keep my medicine? Keep out of the reach of children. Store pre-filled syringes in a refrigerator between 2 and 8 degrees C (36 and 46 degrees F). Do not freeze. Keep in carton to protect from light. Throw away this medicine if it is left out of the refrigerator for more than 48 hours. Throw away any unused medicine after the expiration date. NOTE: This sheet is a summary. It may not cover all possible information. If you have questions about this medicine, talk to your doctor, pharmacist, or health care provider.    2016, [employer]. (2014-04-02 14:30:14)  

## 2015-04-29 NOTE — Telephone Encounter (Signed)
Follow up ° ° ° ° ° °Returning a call to the nurse °

## 2015-04-30 ENCOUNTER — Encounter: Payer: Self-pay | Admitting: Oncology

## 2015-04-30 NOTE — Telephone Encounter (Signed)
Returned call to patient, instructed to d/c Brilinta today for proc on 2/8 and resume afterward. Pt verbalized understanding.

## 2015-04-30 NOTE — Progress Notes (Signed)
[facility] Telephone:(336) [phone]  Fax:(336) [place]: Jan 08, 1935  MR#: [id number]  GGY#:[id number]  Patient Care Team: [name], MD as PCP - General (Family Medicine)  CHIEF COMPLAINT:  Chief Complaint  Patient presents with  . Endometrial Cancer   endometrial cancer stage IIIc 1  VISIT DIAGNOSIS:     ICD-9-CM ICD-10-CM   1. Endometrial cancer (HCC) 182.0 C54.1 CBC with Differential     Comprehensive metabolic panel     Magnesium     CA 125      No history exists.    No flowsheet data found.  INTERVAL HISTORY: After Thanksgiving, for at least 7-10 days, she had noted dried blood on her pad. She did a U/A and took Cipro for 3 days but culture was negative for UTI. 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 She is still having the spotting. No itching or vaginal discharge. Denies pelvic pain.   Patient is on ticagrelor (Brilinta) for her h/o stent placement after MI in July 2014. She is very functional and teaches water aerobics. No chest pain or other cardiac symptoms.  81 year old lady was evaluated by GYN oncology is had a surgery done with   Hysterectomy ,bilateral oophorectomy and lymphadenectomy in January of 2017  Patient was referred to me for possibility of chemotherapy and radiation therapy  REVIEW OF SYSTEMS:    general status: Patient is feeling weak and tired.  No change in a performance status.  No chills.  No fever. Recovering from surgical intervention HEENT: No headache or dizziness. Lungs: No cough or shortness of breath Cardiac: No chest pain or paroxysmal nocturnal dyspnea A senna has a history of coronary artery disease and stent placement GI: No nausea no vomiting no diarrhea no abdominal pain Skin: No rash Lower extremity no swelling Neurological system: No tingling.  No numbness.  No other focal signs Musculoskeletal system no bony  pains GU: Patient had a recent surgery is some abdominal discomfort. Nausea vomiting diarrhea or rectal bleeding  As per HPI. Otherwise, a complete review of systems is negatve.  PAST MEDICAL HISTORY: Past Medical History  Diagnosis Date  . Hypertension   . Coronary artery disease   . Hyperlipidemia   . NSVT (nonsustained ventricular tachycardia) ([place])   . Bradycardia   . 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 Left heart catheterization with coronary angiogram  10/19/2012    Procedure: LEFT HEART CATHETERIZATION WITH CORONARY ANGIOGRAM;  Surgeon: Troy Sine, MD;  Location: [facility] CATH LAB;  Service: Cardiovascular;;  . Percutaneous coronary stent intervention (pci-s)  10/19/2012    Procedure: PERCUTANEOUS CORONARY STENT INTERVENTION (PCI-S);  Surgeon: Troy Sine, MD;  Location: [facility] CATH LAB;  Service: Cardiovascular;;  . Percutaneous coronary stent intervention (pci-s) N/A 10/23/2012    Procedure: PERCUTANEOUS CORONARY STENT INTERVENTION (PCI-S);  Surgeon: Troy Sine, MD;  Location: [facility] CATH LAB;  Service: Cardiovascular;  Laterality: N/A;    FAMILY HISTORY Family History  Problem Relation Age of Onset  . Heart disease Mother   . Breast cancer Mother   . Heart disease Father   . Diabetes Father   . Diabetes Brother   . Stroke Brother     GYNECOLOGIC HISTORY:  No LMP recorded. Patient is postmenopausal.     ADVANCED DIRECTIVES:  Patient does not have any  living will or healthcare power of attorney.  Information was given .  Available resources had been discussed.  We will follow-up on subsequent appointments regarding this issue  HEALTH MAINTENANCE: Social History  Substance Use Topics  . Smoking status: Never Smoker   . Smokeless tobacco: Never Used  . Alcohol Use: 0.6 oz/week    1 Glasses of wine per week      Allergies  Allergen Reactions  . Penicillins Rash    Current Outpatient  Prescriptions  Medication Sig Dispense Refill  . acetaminophen (TYLENOL) 500 MG tablet Take 500 mg by mouth every 6 (six) hours as needed.    [name] aspirin EC 81 MG tablet Take 81 mg by mouth daily.    [name] atorvastatin (LIPITOR) 80 MG tablet TAKE 1 TABLET BY MOUTH AT 6PM 30 tablet 0  . BRILINTA 60 MG TABS tablet TAKE 1 TABLET (60 MG TOTAL) BY MOUTH 2 (TWO) TIMES DAILY. 60 tablet 5  . ezetimibe (ZETIA) 10 MG tablet Take 1 tablet (10 mg total) by mouth daily. 30 tablet 11  . furosemide (LASIX) 20 MG tablet TAKE 1 TABLET BY MOUTH DAILY. 90 tablet 2  . isosorbide mononitrate (IMDUR) 30 MG 24 hr tablet Take 1 tablet (30 mg total) by mouth daily. 30 tablet 11  . losartan (COZAAR) 50 MG tablet Take 1 tablet (50 mg total) by mouth daily. 90 tablet 3  . Multiple Vitamin (MULTIVITAMIN WITH MINERALS) TABS Take 1 tablet by mouth daily.    . nitroGLYCERIN (NITROSTAT) 0.4 MG SL tablet Place 1 tablet (0.4 mg total) under the tongue every 5 (five) minutes x 3 doses as needed for chest pain. 25 tablet 3  . omeprazole (PRILOSEC) 20 MG capsule Take 20 mg by mouth daily.    . Vitamin D, Cholecalciferol, 1000 UNITS CAPS Take 1 capsule by mouth daily.     No current facility-administered medications for this visit.    OBJECTIVE: PHYSICAL EXAM: Gen. status: Patient is alert oriented in mild distress because of postoperative pain Head exam was generally normal. There was no scleral icterus or corneal arcus. Mucous membranes were moist. Examination of the chest was unremarkable. There were no bony deformities, no asymmetry, and no other abnormalities. Cardiac: Systolic murmur.  Regular heart sounds. Abdomen: Soft.  Tenderness present.  Liver and spleen not palpable.  No masses palpable. Neurologically, the patient was awake, alert, and oriented to person, place and time. There were no obvious focal neurologic abnormalities. Examination of the skin revealed no evidence of significant rashes, suspicious appearing nevi or  other concerning lesions. Lower extremity no edema Musculoskeletal system within normal limit  Filed Vitals:   04/28/15 0945  BP: 164/70  Pulse: 58  Temp: 95.8 F (35.4 C)  Resp: 18     Body mass index is 36.5 kg/(m^2).    ECOG FS:1 - Symptomatic but completely ambulatory  LAB RESULTS:  Appointment on 04/28/2015  Component Date Value Ref Range Status  . WBC 04/28/2015 6.8  3.6 - 11.0 K/uL Final  . RBC 04/28/2015 4.50  3.80 - 5.20 MIL/uL Final  . Hemoglobin 04/28/2015 13.7  12.0 - 16.0 g/dL Final  . HCT 04/28/2015 40.0  35.0 - 47.0 % Final  . MCV 04/28/2015 88.9  80.0 - 100.0 fL Final  . MCH 04/28/2015 30.6  26.0 - 34.0 pg Final  . MCHC 04/28/2015 34.4  32.0 - 36.0 g/dL Final  . RDW 04/28/2015 13.3  11.5 - 14.5 % Final  . Platelets 04/28/2015 288  150 - 440 K/uL Final  . Neutrophils Relative % 04/28/2015 64   Final  . Neutro Abs 04/28/2015 4.4  1.4 - 6.5 K/uL Final  . Lymphocytes Relative 04/28/2015 22   Final  . Lymphs Abs 04/28/2015 1.5  1.0 - 3.6 K/uL Final  . Monocytes Relative 04/28/2015 9   Final  . Monocytes Absolute 04/28/2015 0.6  0.2 - 0.9 K/uL Final  . Eosinophils Relative 04/28/2015 4   Final  . Eosinophils Absolute 04/28/2015 0.3  0 - 0.7 K/uL Final  . Basophils Relative 04/28/2015 1   Final  . Basophils Absolute 04/28/2015 0.1  0 - 0.1 K/uL Final  . Sodium 04/28/2015 135  135 - 145 mmol/L Final  . Potassium 04/28/2015 3.7  3.5 - 5.1 mmol/L Final  . Chloride 04/28/2015 103  101 - 111 mmol/L Final  . CO2 04/28/2015 26  22 - 32 mmol/L Final  . Glucose, Bld 04/28/2015 99  65 - 99 mg/dL Final  . BUN 04/28/2015 20  6 - 20 mg/dL Final  . Creatinine, Ser 04/28/2015 0.89  0.44 - 1.00 mg/dL Final  . Calcium 04/28/2015 9.4  8.9 - 10.3 mg/dL Final  . Total Protein 04/28/2015 7.2  6.5 - 8.1 g/dL Final  . Albumin 04/28/2015 4.2  3.5 - 5.0 g/dL Final  . AST 04/28/2015 21  15 - 41 U/L Final  . ALT 04/28/2015 14  14 - 54 U/L Final  . Alkaline Phosphatase 04/28/2015 91  38  - 126 U/L Final  . Total Bilirubin 04/28/2015 0.4  0.3 - 1.2 mg/dL Final  . GFR calc non Af Amer 04/28/2015 >60  >60 mL/min Final  . GFR calc Af Amer 04/28/2015 >60  >60 mL/min Final   Comment: (NOTE) The eGFR has been calculated using the CKD EPI equation. This calculation has not been validated in all clinical situations. eGFR's persistently <60 mL/min signify possible Chronic Kidney Disease.   . Anion gap 04/28/2015 6  5 - 15 Final  . Magnesium 04/28/2015 2.3  1.7 - 2.4 mg/dL Final  . CA 125 04/28/2015 82.5* 0.0 - 38.1 U/mL Final   Comment: (NOTE) Roche ECLIA methodology Performed At: [facility] [place], [place] [id number] Lindon Romp MD FO:[id number]      STUDIES: Pathology report as well as previous CT scan has been reviewed independently ASSESSMENT:  Endometrial carcinoma stage III C1 Lymphovascular invasion Endometrioid and serous carcinoma Regional lymph nodes are positive Aortic lymph nodes are negative washings were negative   PLAN:  No problem-specific assessment & plan notes found for this encounter.  had discussed situation with GYN oncologist and had prolonged discussion with patient and family.  We proceed with salvage therapy with carboplatinum and Taxol threes-4 cycles followed by radiation therapy followed by additional 2-3 cycles of chemotherapy All the side effects of chemotherapy including myelosuppression, alopecia, nausea vomiting fatigue weakness.  Secondary infection, and   peripheral neuropathy .  Has been discussed in details. Informal consent has been obtained and will be documented by nurses in the chart Intent of chemotherapy   is   Cure  Proceed with port placement  Patient expressed understanding and was in agreement with this plan. She also understands that She can call clinic at any time with any questions, concerns, or complaints.    Carcinoma of endometrium [facility])   Staging form: Corpus Uteri - Carcinoma,  AJCC 7th Edition     Clinical: Stage IIIC1 (T1a, N1, M0) - Signed by Forest Gleason,  MD on 04/30/2015   Forest Gleason, MD   04/30/2015 4:54 PM     Gy

## 2015-04-30 NOTE — Telephone Encounter (Signed)
Spoke to patient.  She is concerned about whether or not chemo meds will interact w/ her Brilinta/other meds. She has already been advised by oncologist that this should not be an issue.  She also notes she is going for portacath placement on Wednesday 2/8 - does not know if she should stop Brilinta and was not given instruction on this by surg scheduler. Aware I will defer to physician for review. She remarks that she was given clearance to hold Brilinta for recent hysterectomy.

## 2015-04-30 NOTE — Telephone Encounter (Signed)
Susan [name] is calling to speak to a nurse , states that she has called everyday this week. Please call

## 2015-04-30 NOTE — Telephone Encounter (Signed)
She should stop Brilinta 5 days before the procedure, please

## 2015-05-02 ENCOUNTER — Other Ambulatory Visit: Payer: Self-pay | Admitting: Cardiovascular Disease

## 2015-05-03 NOTE — Telephone Encounter (Signed)
Rx(s) sent to pharmacy electronically.  

## 2015-05-04 ENCOUNTER — Inpatient Hospital Stay: Payer: Medicare Other

## 2015-05-04 ENCOUNTER — Other Ambulatory Visit: Payer: Self-pay | Admitting: Vascular Surgery

## 2015-05-05 ENCOUNTER — Encounter
Admission: RE | Disposition: A | Discharge status: Home / Self Care | Payer: Self-pay | Source: Ambulatory Visit | Attending: Vascular Surgery

## 2015-05-05 ENCOUNTER — Ambulatory Visit
Admission: RE | Admit: 2015-05-05 | Discharge: 2015-05-05 | Disposition: A | Discharge status: Home / Self Care | Payer: Medicare Other | Source: Ambulatory Visit | Attending: Vascular Surgery | Admitting: Vascular Surgery

## 2015-05-05 ENCOUNTER — Ambulatory Visit: Payer: Medicare Other

## 2015-05-05 ENCOUNTER — Encounter: Payer: Self-pay | Admitting: *Deleted

## 2015-05-05 DIAGNOSIS — Z79899 Other long term (current) drug therapy: Secondary | ICD-10-CM | POA: Insufficient documentation

## 2015-05-05 DIAGNOSIS — Z880 Allergy status to penicillin: Secondary | ICD-10-CM | POA: Insufficient documentation

## 2015-05-05 DIAGNOSIS — E785 Hyperlipidemia, unspecified: Secondary | ICD-10-CM | POA: Insufficient documentation

## 2015-05-05 DIAGNOSIS — Z8542 Personal history of malignant neoplasm of other parts of uterus: Secondary | ICD-10-CM | POA: Diagnosis not present

## 2015-05-05 DIAGNOSIS — I2510 Atherosclerotic heart disease of native coronary artery without angina pectoris: Secondary | ICD-10-CM | POA: Insufficient documentation

## 2015-05-05 DIAGNOSIS — Z7982 Long term (current) use of aspirin: Secondary | ICD-10-CM | POA: Insufficient documentation

## 2015-05-05 DIAGNOSIS — I252 Old myocardial infarction: Secondary | ICD-10-CM | POA: Diagnosis not present

## 2015-05-05 DIAGNOSIS — I10 Essential (primary) hypertension: Secondary | ICD-10-CM | POA: Diagnosis not present

## 2015-05-05 DIAGNOSIS — C569 Malignant neoplasm of unspecified ovary: Secondary | ICD-10-CM | POA: Insufficient documentation

## 2015-05-05 HISTORY — PX: PERIPHERAL VASCULAR CATHETERIZATION: SHX172C

## 2015-05-05 HISTORY — DX: Acute myocardial infarction, unspecified: I21.9

## 2015-05-05 HISTORY — DX: Cardiac murmur, unspecified: R01.1

## 2015-05-05 SURGERY — PORTA CATH INSERTION
Anesthesia: Moderate Sedation

## 2015-05-05 MED ORDER — LIDOCAINE-EPINEPHRINE (PF) 1 %-1:200000 IJ SOLN
INTRAMUSCULAR | Status: AC
  Filled 2015-05-05: qty 30

## 2015-05-05 MED ORDER — MIDAZOLAM HCL 2 MG/2ML IJ SOLN
INTRAMUSCULAR | Status: DC | PRN
  Administered 2015-05-05 (×3): 1 mg via INTRAVENOUS

## 2015-05-05 MED ORDER — ONDANSETRON HCL 4 MG/2ML IJ SOLN: 4.0000 mg | Freq: Four times a day (QID) | INTRAMUSCULAR | Status: DC | PRN

## 2015-05-05 MED ORDER — HYDROMORPHONE HCL 1 MG/ML IJ SOLN: 1.0000 mg | Freq: Once | INTRAMUSCULAR | Status: DC

## 2015-05-05 MED ORDER — MIDAZOLAM HCL 2 MG/2ML IJ SOLN
INTRAMUSCULAR | Status: AC
  Filled 2015-05-05: qty 2

## 2015-05-05 MED ORDER — SODIUM CHLORIDE 0.9 % IV SOLN
INTRAVENOUS | Status: DC
  Administered 2015-05-05 (×2): via INTRAVENOUS

## 2015-05-05 MED ORDER — FENTANYL CITRATE (PF) 100 MCG/2ML IJ SOLN
INTRAMUSCULAR | Status: DC | PRN
  Administered 2015-05-05 (×3): 25 ug via INTRAVENOUS

## 2015-05-05 MED ORDER — FENTANYL CITRATE (PF) 100 MCG/2ML IJ SOLN
INTRAMUSCULAR | Status: AC
  Filled 2015-05-05: qty 2

## 2015-05-05 MED ORDER — NITROGLYCERIN 1 MG/10 ML FOR IR/CATH LAB
INTRA_ARTERIAL | Status: DC | PRN
  Administered 2015-05-05: 15:00:00

## 2015-05-05 MED ORDER — CLINDAMYCIN PHOSPHATE 300 MG/50ML IV SOLN
300.0000 mg | Freq: Once | INTRAVENOUS | Status: AC
  Administered 2015-05-05: 300 mg via INTRAVENOUS

## 2015-05-05 MED ORDER — GENTAMICIN SULFATE 40 MG/ML IJ SOLN
Freq: Once | INTRAMUSCULAR | Status: AC
  Administered 2015-05-05: 15:00:00
  Filled 2015-05-05: qty 2

## 2015-05-05 MED ORDER — CLINDAMYCIN PHOSPHATE 300 MG/50ML IV SOLN
INTRAVENOUS | Status: AC
  Filled 2015-05-05: qty 50

## 2015-05-05 MED ORDER — CLINDAMYCIN PHOSPHATE 300 MG/50ML IV SOLN: 300.0000 mg | Freq: Once | INTRAVENOUS | Status: DC

## 2015-05-05 SURGICAL SUPPLY — 10 items
BAG DECANTER STRL (MISCELLANEOUS) ×3 IMPLANT
KIT PORT POWER 8FR ISP CVUE (Catheter) ×3 IMPLANT
PACK ANGIOGRAPHY (CUSTOM PROCEDURE TRAY) ×3 IMPLANT
PAD GROUND ADULT SPLIT (MISCELLANEOUS) ×3 IMPLANT
PENCIL ELECTRO HAND CTR (MISCELLANEOUS) ×3 IMPLANT
PREP CHG 10.5 TEAL (MISCELLANEOUS) ×3 IMPLANT
SUT MNCRL AB 4-0 PS2 18 (SUTURE) ×3 IMPLANT
SUT PROLENE 0 CT 1 30 (SUTURE) ×3 IMPLANT
SUTURE VIC 3-0 (SUTURE) ×3 IMPLANT
TOWEL OR 17X26 4PK STRL BLUE (TOWEL DISPOSABLE) ×3 IMPLANT

## 2015-05-05 NOTE — Op Note (Signed)
      [facility]       Operative Note  Date: 05/05/2015  Preoperative diagnosis:  1. Ovarian cancer  Postoperative diagnosis:  Same as above  Procedures: #1. Ultrasound guidance for vascular access to the right internal jugular vein. #2. Fluoroscopic guidance for placement of catheter. #3. Placement of CT compatible Port-A-Cath, right internal jugular vein.  Surgeon: Leotis Pain, MD.   Anesthesia: Local with moderate conscious sedation for approximately 25  minutes using 3 mg of Versed and 75 mcg of Fentanyl  Fluoroscopy time: less than 1 minute  Contrast used: 0  Estimated blood loss: 20 cc  Indication for the procedure:  The patient is a 81 y.o.female with ovarian cancer.  The patient needs a Port-A-Cath for durable venous access, chemotherapy, lab draws, and CT scans. We are asked to place this. Risks and benefits were discussed and informed consent was obtained.  Description of procedure: The patient was brought to the vascular and interventional radiology suite.  Moderate conscious sedation was administered throughout a face to face encounter with the patient during the procedure with my supervision of the RN administering medicines and monitoring the patient's vital signs, pulse oximetry, telemetry and mental status throughout from the start of the procedure until the patient was taken to the recovery room. The right neck chest and shoulder were sterilely prepped and draped, and a sterile surgical field was created. Ultrasound was used to help visualize a patent right internal jugular vein. This was then accessed under direct ultrasound guidance without difficulty with the Seldinger needle and a permanent image was recorded. A J-wire was placed. After skin nick and dilatation, the peel-away sheath was then placed over the wire. I then anesthetized an area under the clavicle approximately 1-2 fingerbreadths. A transverse incision was created and an inferior pocket  was created with electrocautery and blunt dissection. The port was then brought onto the field, placed into the pocket and secured to the chest wall with 2 Prolene sutures. The catheter was connected to the port and tunneled from the subclavicular incision to the access site. Fluoroscopic guidance was then used to cut the catheter to an appropriate length. The catheter was then placed through the peel-away sheath and the peel-away sheath was removed. The catheter tip was parked in excellent location under fluorocoscopic guidance in the cavoatrial junction. The pocket was then irrigated with antibiotic impregnated saline and the wound was closed with a running 3-0 Vicryl and a 4-0 Monocryl. The access incision was closed with a single 4-0 Monocryl. The Huber needle was used to withdraw blood and flush the port with heparinized saline. Dermabond was then placed as a dressing. The patient tolerated the procedure well and was taken to the recovery room in stable condition.   DEW,JASON 05/05/2015 2:46 PM

## 2015-05-05 NOTE — H&P (Signed)
  [facility] History & Physical Update  The patient was interviewed and re-examined.  The patient's previous History and Physical has been reviewed and is unchanged.  There is no change in the plan of care. We plan to proceed with the scheduled procedure.  Chanta Bauers, MD  05/05/2015, 1:38 PM

## 2015-05-05 NOTE — Discharge Instructions (Signed)
, Care After °Refer to this sheet in the next few weeks. These instructions provide you with information on caring for yourself after your procedure. Your caregiver may also give you more specific instructions. Your treatment has been planned according to current medical practices, but problems sometimes occur. Call your caregiver if you have any problems or questions after your procedure.  °HOME CARE INSTRUCTIONS °· Rest at home the day of the procedure. You will likely be able to return to normal activities the following day. °· Follow your caregiver's specific instructions for the type of device that you have. °· Only take over-the-counter or prescription medicines as directed by your caregiver. °· Keep the insertion site of the catheter clean and dry at all times. °¨ Change the bandages (dressings) over the catheter site as directed by your caregiver. °¨ Wash the area around the catheter site during each dressing change. Sponge bathe the area using a germ-killing (antiseptic) solution as directed by your caregiver. °¨ Look for redness or swelling at the insertion site during each dressing change. °· Apply an antibiotic ointment as directed by your caregiver. °· Flush your catheter as directed to keep it from becoming clogged. °· Always wash your hands thoroughly before changing dressings or flushing the catheter. °· Do not let air enter the catheter. °¨ Never open the cap at the catheter tip. °¨ Always make sure there is no air in the syringe or in the tubing for infusions.    °· Do not lift anything heavy. °· Do not drive until your caregiver approves. °· Do not shower or bathe until your caregiver approves. When you shower or bathe, place a piece of plastic wrap over the catheter site. Do not allow the catheter site or the dressing to get wet. If taking a bath, do not allow the catheter to get submerged in the water. °If the catheter was inserted through an arm vein:  °· Avoid wearing tight clothes or jewelry  on the arm that has the catheter.   °· Do not sleep with your head on the arm that has the catheter.   °· Do not allow use of a blood pressure cuff on the arm that has the catheter.   °· Do not let anyone draw blood from the arm that has the catheter, except through the catheter itself. °SEEK MEDICAL CARE IF: °· You have bleeding at the insertion site of the catheter.   °· You feel weak or nauseous.   °· Your catheter is not working properly.   °· You have redness, pain, swelling, and warmth at the insertion site.   °· You notice fluid draining from the insertion site.   °SEEK IMMEDIATE MEDICAL CARE IF: °· Your catheter breaks or has a hole in it.   °· Your catheter comes loose or gets pulled completely out. If this happens, hold firm pressure over the area with your hand or a clean cloth.   °· You have a fever. °· You have chills.   °· Your catheter becomes totally blocked.   °· You have swelling in your arm, shoulder, neck, or face.   °· You have bleeding from the insertion site that does not stop.   °· You develop chest pain or have trouble breathing.   °· You feel dizzy or faint.   °MAKE SURE YOU: °· Understand these instructions. °· Will watch your condition. °· Will get help right away if you are not doing well or get worse. °  °This information is not intended to replace advice given to you by your health care provider. Make sure you discuss any questions you   have with your health care provider. °  °Document Released: 02/28/2012 Document Revised: 11/13/2012 Document Reviewed: 02/28/2012 °Elsevier Interactive Patient Education ©2016 [employer] ° °

## 2015-05-05 NOTE — Progress Notes (Signed)
Pt clinically stable post port insertion, husband present, awake, alert and oriented, Dr dEW OUT to speak with patient with questions answered, ready for discharge.

## 2015-05-06 ENCOUNTER — Encounter: Payer: Self-pay | Admitting: Vascular Surgery

## 2015-05-06 ENCOUNTER — Other Ambulatory Visit: Payer: Self-pay | Admitting: *Deleted

## 2015-05-06 DIAGNOSIS — C541 Malignant neoplasm of endometrium: Secondary | ICD-10-CM

## 2015-05-06 MED ORDER — ONDANSETRON HCL 8 MG PO TABS: 8.0000 mg | ORAL_TABLET | Freq: Two times a day (BID) | ORAL | Status: DC | PRN

## 2015-05-06 MED ORDER — DEXAMETHASONE 4 MG PO TABS: 4.0000 mg | ORAL_TABLET | Freq: Every day | ORAL | Status: DC

## 2015-05-06 MED ORDER — LIDOCAINE-PRILOCAINE 2.5-2.5 % EX CREA: TOPICAL_CREAM | CUTANEOUS | Status: DC

## 2015-05-10 NOTE — H&P (Signed)
[facility] Admission History & Physical  MRN : [id number]  Susan [name] is a 81 y.o. (Aug 03, 1934) female who presents with chief complaint of No chief complaint on file. [name]  History of Present Illness: Patient is scheduled to start chemotherapy and needs a Port-A-Cath for durable venous access. She has a recently diagnosed ovarian cancer. She reports no specific complaints today.  No current facility-administered medications for this encounter.   Current Outpatient Prescriptions  Medication Sig Dispense Refill  . acetaminophen (TYLENOL) 500 MG tablet Take 500 mg by mouth every 6 (six) hours as needed.    [name] aspirin EC 81 MG tablet Take 81 mg by mouth daily.    [name] atorvastatin (LIPITOR) 80 MG tablet TAKE 1 TABLET BY MOUTH AT 6PM 30 tablet 0  . ezetimibe (ZETIA) 10 MG tablet Take 1 tablet (10 mg total) by mouth daily. 30 tablet 11  . furosemide (LASIX) 20 MG tablet TAKE 1 TABLET BY MOUTH DAILY. 90 tablet 2  . isosorbide mononitrate (IMDUR) 30 MG 24 hr tablet Take 1 tablet (30 mg total) by mouth daily. 30 tablet 11  . losartan (COZAAR) 50 MG tablet Take 1 tablet (50 mg total) by mouth daily. 90 tablet 3  . Multiple Vitamin (MULTIVITAMIN WITH MINERALS) TABS Take 1 tablet by mouth daily.    . nitroGLYCERIN (NITROSTAT) 0.4 MG SL tablet Place 1 tablet (0.4 mg total) under the tongue every 5 (five) minutes x 3 doses as needed for chest pain. 25 tablet 3  . omeprazole (PRILOSEC) 20 MG capsule Take 20 mg by mouth daily.    . Vitamin D, Cholecalciferol, 1000 UNITS CAPS Take 1 capsule by mouth daily.    [name] BRILINTA 60 MG TABS tablet TAKE 1 TABLET (60 MG TOTAL) BY MOUTH 2 (TWO) TIMES DAILY. 60 tablet 5  . dexamethasone (DECADRON) 4 MG tablet Take 1 tablet (4 mg total) by mouth daily. Take one tablet off Decadron (4 mg) twice a day before each chemotherapy 20 tablet 1  . lidocaine-prilocaine (EMLA) cream Apply to affected area once 30 g 3  . ondansetron (ZOFRAN) 8 MG tablet  Take 1 tablet (8 mg total) by mouth 2 (two) times daily as needed for refractory nausea / vomiting. Start on day 3 after chemo. 30 tablet 1    Past Medical History  Diagnosis Date  . Hypertension   . Coronary artery disease   . Hyperlipidemia   . NSVT (nonsustained ventricular tachycardia) ([place])   . Bradycardia   . 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 Myocardial infarction ([place])   . Heart murmur     Past Surgical History  Procedure Laterality Date  . Left heart catheterization with coronary angiogram  10/19/2012    Procedure: LEFT HEART CATHETERIZATION WITH CORONARY ANGIOGRAM;  Surgeon: Troy Sine, MD;  Location: [facility] CATH LAB;  Service: Cardiovascular;;  . Percutaneous coronary stent intervention (pci-s)  10/19/2012    Procedure: PERCUTANEOUS CORONARY STENT INTERVENTION (PCI-S);  Surgeon: Troy Sine, MD;  Location: [facility] CATH LAB;  Service: Cardiovascular;;  . Percutaneous coronary stent intervention (pci-s) N/A 10/23/2012    Procedure: PERCUTANEOUS CORONARY STENT INTERVENTION (PCI-S);  Surgeon: Troy Sine, MD;  Location: [facility] CATH LAB;  Service: Cardiovascular;  Laterality: N/A;  . Abdominal hysterectomy    . Peripheral vascular catheterization N/A 05/05/2015    Procedure: Glori Luis Cath Insertion;  Surgeon: Algernon Huxley, MD;  Location: [facility] CV LAB;  Service: Cardiovascular;  Laterality: N/A;  Social History Social History  Substance Use Topics  . Smoking status: Never Smoker   . Smokeless tobacco: Never Used  . Alcohol Use: 0.6 oz/week    1 Glasses of wine per week    Family History Family History  Problem Relation Age of Onset  . Heart disease Mother   . Breast cancer Mother   . Heart disease Father   . Diabetes Father   . Diabetes Brother   . Stroke Brother     Allergies  Allergen Reactions  . Penicillins Rash     REVIEW OF SYSTEMS (Negative unless checked)  Constitutional: [] Weight loss  [] Fever  [] Chills Cardiac: [] Chest pain   [] Chest pressure    [] Palpitations   [] Shortness of breath when laying flat   [] Shortness of breath at rest   [] Shortness of breath with exertion. Vascular:  [] Pain in legs with walking   [] Pain in legs at rest   [] Pain in legs when laying flat   [] Claudication   [] Pain in feet when walking  [] Pain in feet at rest  [] Pain in feet when laying flat   [] History of DVT   [] Phlebitis   [] Swelling in legs   [] Varicose veins   [] Non-healing ulcers Pulmonary:   [] Uses home oxygen   [] Productive cough   [] Hemoptysis   [] Wheeze  [] COPD   [] Asthma Neurologic:  [] Dizziness  [] Blackouts   [] Seizures   [] History of stroke   [] History of TIA  [] Aphasia   [] Temporary blindness   [] Dysphagia   [] Weakness or numbness in arms   [] Weakness or numbness in legs Musculoskeletal:  [] Arthritis   [] Joint swelling   [] Joint pain   [] Low back pain Hematologic:  [] Easy bruising  [] Easy bleeding   [] Hypercoagulable state   [] Anemic  [] Hepatitis Gastrointestinal:  [] Blood in stool   [] Vomiting blood  [] Gastroesophageal reflux/heartburn   [] Difficulty swallowing. Genitourinary:  [] Chronic kidney disease   [] Difficult urination  [] Frequent urination  [] Burning with urination   [] Blood in urine Skin:  [] Rashes   [] Ulcers   [] Wounds Psychological:  [] History of anxiety   []  History of major depression.  Physical Examination  Filed Vitals:   05/05/15 1458 05/05/15 1500 05/05/15 1517 05/05/15 1521  BP: 116/48 118/47 113/52 118/77  Pulse: 59 59    Resp: 16 16 18 14   Height:      Weight:      SpO2: 96% 93% 95% 97%   Body mass index is 37.03 kg/(m^2). Gen: WD/WN, NAD Head: [place]/AT, No temporalis wasting. Prominent temp pulse not noted. Ear/Nose/Throat: Hearing grossly intact, nares w/o erythema or drainage, oropharynx w/o Erythema/Exudate,  Eyes: PERRLA, EOMI.  Neck: Supple, no nuchal rigidity.  No bruit or JVD.  Pulmonary:  Good air movement, clear to auscultation bilaterally, no use of accessory muscles.  Cardiac: RRR, normal S1, S2, no Murmurs,  rubs or gallops. Vascular:  Vessel Right Left  Radial Palpable Palpable  Ulnar Palpable Palpable  Brachial Palpable Palpable  Carotid Palpable, without bruit Palpable, without bruit  Aorta Not palpable N/A  Femoral Palpable Palpable  Popliteal Palpable Palpable  PT Palpable Palpable  DP Palpable Palpable   Gastrointestinal: soft, non-tender/non-distended. No guarding/reflex.  Musculoskeletal: M/S 5/5 throughout.  Extremities without ischemic changes.  No deformity or atrophy.  Neurologic: CN 2-12 intact. Pain and light touch intact in extremities.  Symmetrical.  Speech is fluent. Motor exam as listed above. Psychiatric: Judgment intact, Mood & affect appropriate for pt's clinical situation. Dermatologic: No rashes or ulcers noted.  No cellulitis  or open wounds. Lymph : No Cervical, Axillary, or Inguinal lymphadenopathy.     CBC Lab Results  Component Value Date   WBC 6.8 04/28/2015   HGB 13.7 04/28/2015   HCT 40.0 04/28/2015   MCV 88.9 04/28/2015   PLT 288 04/28/2015    BMET    Component Value Date/Time   NA 135 04/28/2015 1114   NA 141 12/17/2013 1314   K 3.7 04/28/2015 1114   K 4.1 12/17/2013 1314   CL 103 04/28/2015 1114   CL 105 12/17/2013 1314   CO2 26 04/28/2015 1114   CO2 32 12/17/2013 1314   GLUCOSE 99 04/28/2015 1114   GLUCOSE 86 12/17/2013 1314   BUN 20 04/28/2015 1114   BUN 20* 12/17/2013 1314   CREATININE 0.89 04/28/2015 1114   CREATININE 0.82 12/16/2014 1425   CREATININE 0.97 12/17/2013 1314   CALCIUM 9.4 04/28/2015 1114   CALCIUM 9.5 12/17/2013 1314   GFRNONAA >60 04/28/2015 1114   GFRNONAA 59* 12/17/2013 1314   GFRNONAA 52* 10/19/2012 1935   GFRAA >60 04/28/2015 1114   GFRAA >60 12/17/2013 1314   GFRAA >60 10/19/2012 1935   Estimated Creatinine Clearance: 57.1 mL/min (by C-G formula based on Cr of 0.89).  COAG Lab Results  Component Value Date   INR 1.0 12/17/2013   INR 1.12 10/22/2012   INR 1.86* 10/19/2012    Radiology No results  found.    Assessment/Plan 1. Ovarian cancer. Needs durable venous access for chemotherapy. Her oncologist has recommended a Port-A-Cath and we will place this today. Risks and benefits were discussed. 2. Hyperlipidemia. Stable outpatient medications. 3. Coronary disease. Stable. No current symptoms.   Issac Moure, MD  05/10/2015 11:45 AM

## 2015-05-12 ENCOUNTER — Inpatient Hospital Stay (HOSPITAL_BASED_OUTPATIENT_CLINIC_OR_DEPARTMENT_OTHER): Payer: Medicare Other | Admitting: Oncology

## 2015-05-12 ENCOUNTER — Encounter: Payer: Self-pay | Admitting: Oncology

## 2015-05-12 ENCOUNTER — Inpatient Hospital Stay: Payer: Medicare Other

## 2015-05-12 VITALS — BP 144/70 | HR 59

## 2015-05-12 VITALS — BP 158/80 | HR 61 | Temp 96.2°F | Wt 211.9 lb

## 2015-05-12 DIAGNOSIS — R531 Weakness: Secondary | ICD-10-CM | POA: Diagnosis not present

## 2015-05-12 DIAGNOSIS — C541 Malignant neoplasm of endometrium: Secondary | ICD-10-CM | POA: Diagnosis not present

## 2015-05-12 DIAGNOSIS — I2510 Atherosclerotic heart disease of native coronary artery without angina pectoris: Secondary | ICD-10-CM

## 2015-05-12 DIAGNOSIS — Z90710 Acquired absence of both cervix and uterus: Secondary | ICD-10-CM

## 2015-05-12 DIAGNOSIS — Z90722 Acquired absence of ovaries, bilateral: Secondary | ICD-10-CM | POA: Diagnosis not present

## 2015-05-12 DIAGNOSIS — I10 Essential (primary) hypertension: Secondary | ICD-10-CM

## 2015-05-12 DIAGNOSIS — Z7901 Long term (current) use of anticoagulants: Secondary | ICD-10-CM

## 2015-05-12 DIAGNOSIS — Z95818 Presence of other cardiac implants and grafts: Secondary | ICD-10-CM

## 2015-05-12 DIAGNOSIS — I252 Old myocardial infarction: Secondary | ICD-10-CM

## 2015-05-12 DIAGNOSIS — R5383 Other fatigue: Secondary | ICD-10-CM

## 2015-05-12 DIAGNOSIS — Z7982 Long term (current) use of aspirin: Secondary | ICD-10-CM

## 2015-05-12 DIAGNOSIS — R011 Cardiac murmur, unspecified: Secondary | ICD-10-CM

## 2015-05-12 MED ORDER — SODIUM CHLORIDE 0.9% FLUSH
10.0000 mL | INTRAVENOUS | Status: DC | PRN
  Administered 2015-05-12: 10 mL
  Filled 2015-05-12: qty 10

## 2015-05-12 MED ORDER — HEPARIN SOD (PORK) LOCK FLUSH 100 UNIT/ML IV SOLN
500.0000 [IU] | Freq: Once | INTRAVENOUS | Status: AC | PRN
  Administered 2015-05-12: 500 [IU]
  Filled 2015-05-12: qty 5

## 2015-05-12 MED ORDER — PACLITAXEL CHEMO INJECTION 300 MG/50ML
175.0000 mg/m2 | Freq: Once | INTRAVENOUS | Status: DC
  Filled 2015-05-12: qty 60

## 2015-05-12 MED ORDER — SODIUM CHLORIDE 0.9 % IV SOLN
175.0000 mg/m2 | Freq: Once | INTRAVENOUS | Status: AC
  Administered 2015-05-12: 360 mg via INTRAVENOUS
  Filled 2015-05-12: qty 60

## 2015-05-12 MED ORDER — SODIUM CHLORIDE 0.9 % IV SOLN
Freq: Once | INTRAVENOUS | Status: AC
  Administered 2015-05-12: 10:00:00 via INTRAVENOUS
  Filled 2015-05-12: qty 1000

## 2015-05-12 MED ORDER — DIPHENHYDRAMINE HCL 50 MG/ML IJ SOLN
50.0000 mg | Freq: Once | INTRAMUSCULAR | Status: AC
  Administered 2015-05-12: 50 mg via INTRAVENOUS
  Filled 2015-05-12: qty 1

## 2015-05-12 MED ORDER — PEGFILGRASTIM 6 MG/0.6ML ~~LOC~~ PSKT
6.0000 mg | PREFILLED_SYRINGE | Freq: Once | SUBCUTANEOUS | Status: AC
  Administered 2015-05-12: 6 mg via SUBCUTANEOUS
  Filled 2015-05-12: qty 0.6

## 2015-05-12 MED ORDER — FAMOTIDINE IN NACL 20-0.9 MG/50ML-% IV SOLN
20.0000 mg | Freq: Once | INTRAVENOUS | Status: AC
Start: 2015-05-12 — End: 2015-05-12
  Administered 2015-05-12: 20 mg via INTRAVENOUS
  Filled 2015-05-12: qty 50

## 2015-05-12 MED ORDER — PALONOSETRON HCL INJECTION 0.25 MG/5ML
0.2500 mg | Freq: Once | INTRAVENOUS | Status: AC
  Administered 2015-05-12: 0.25 mg via INTRAVENOUS
  Filled 2015-05-12: qty 5

## 2015-05-12 MED ORDER — CARBOPLATIN CHEMO INJECTION 600 MG/60ML
472.5000 mg | Freq: Once | INTRAVENOUS | Status: AC
  Administered 2015-05-12: 470 mg via INTRAVENOUS
  Filled 2015-05-12: qty 47

## 2015-05-12 MED ORDER — SODIUM CHLORIDE 0.9 % IV SOLN
20.0000 mg | Freq: Once | INTRAVENOUS | Status: AC
  Administered 2015-05-12: 20 mg via INTRAVENOUS
  Filled 2015-05-12: qty 2

## 2015-05-12 NOTE — Progress Notes (Signed)
Susan [name] @ [facility] Telephone:([phone]) [phone]  Fax:([phone]) [place]: February 01, 1935  MR#: [id number]  XQJ#:[id number]  Patient Care Team: Susan Pitch, [name] as PCP - General (Family Medicine)  CHIEF COMPLAINT:  Chief Complaint  Patient presents with  . Endometrial Cancer   endometrial cancer stage IIIc 1  VISIT DIAGNOSIS:     ICD-9-CM ICD-10-CM   1. Endometrial cancer (HCC) 182.0 C54.1 CBC with Differential     Comprehensive metabolic panel     Magnesium     CA 125      No history exists.    No flowsheet data found.  INTERVAL HISTORY: After Thanksgiving, for at least 7-10 days, she had noted dried blood on her pad. She did a U/A and took Cipro for 3 days but culture was negative for UTI. 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 She is still having the spotting. No itching or vaginal discharge. Denies pelvic pain.   Patient is on ticagrelor (Brilinta) for her h/o stent placement after MI in July 2014. She is very functional and teaches water aerobics. No chest pain or other cardiac symptoms.  81 year old lady was evaluated by GYN oncology is had a surgery done with   Hysterectomy ,bilateral oophorectomy and lymphadenectomy in January of 2017  Patient is here to start the first cycle of chemotherapy.  Patient had a port placement as well as had chemotherapy class. REVIEW OF SYSTEMS:    general status: Patient is feeling weak and tired.  No change in a performance status.  No chills.  No fever. Recovering from surgical intervention HEENT: No headache or dizziness. Lungs: No cough or shortness of breath Cardiac: No chest pain or paroxysmal nocturnal dyspnea A senna has a history of coronary artery disease and stent placement GI: No nausea no vomiting no diarrhea no abdominal pain Skin: No rash Lower extremity no swelling Neurological system: No tingling.  No numbness.  No other focal  signs Musculoskeletal system no bony pains GU: Patient had a recent surgery is some abdominal discomfort. Nausea vomiting diarrhea or rectal bleeding  As per HPI. Otherwise, a complete review of systems is negatve.  PAST MEDICAL HISTORY: Past Medical History  Diagnosis Date  . Hypertension   . Coronary artery disease   . Hyperlipidemia   . NSVT (nonsustained ventricular tachycardia) ([place])   . Bradycardia   . 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 Myocardial infarction ([place])   . Heart murmur     PAST SURGICAL HISTORY: Past Surgical History  Procedure Laterality Date  . Left heart catheterization with coronary angiogram  10/19/2012    Procedure: LEFT HEART CATHETERIZATION WITH CORONARY ANGIOGRAM;  Surgeon: Susan [name];  Location: [facility] CATH LAB;  Service: Cardiovascular;;  . Percutaneous coronary stent intervention (pci-s)  10/19/2012    Procedure: PERCUTANEOUS CORONARY STENT INTERVENTION (PCI-S);  Surgeon: Susan [name];  Location: [facility] CATH LAB;  Service: Cardiovascular;;  . Percutaneous coronary stent intervention (pci-s) N/A 10/23/2012    Procedure: PERCUTANEOUS CORONARY STENT INTERVENTION (PCI-S);  Surgeon: Susan [name];  Location: [facility] CATH LAB;  Service: Cardiovascular;  Laterality: N/A;  . Abdominal hysterectomy    . Peripheral vascular catheterization N/A 05/05/2015    Procedure: Susan [name] Cath Insertion;  Surgeon: Susan [name];  Location: [facility] CV LAB;  Service: Cardiovascular;  Laterality: N/A;    FAMILY HISTORY Family History  Problem Relation Age of  Onset  . Heart disease Mother   . Breast cancer Mother   . Heart disease Father   . Diabetes Father   . Diabetes Brother   . Stroke Brother     GYNECOLOGIC HISTORY:  No LMP recorded. Patient is postmenopausal.     ADVANCED DIRECTIVES:  Patient does not have any living will or healthcare power of attorney.  Information was given .  Available resources had been discussed.  We will follow-up on  subsequent appointments regarding this issue  HEALTH MAINTENANCE: Social History  Substance Use Topics  . Smoking status: Never Smoker   . Smokeless tobacco: Never Used  . Alcohol Use: 0.6 oz/week    1 Glasses of wine per week      Allergies  Allergen Reactions  . Penicillins Rash    Current Outpatient Prescriptions  Medication Sig Dispense Refill  . acetaminophen (TYLENOL) 500 MG tablet Take 500 mg by mouth every 6 (six) hours as needed.    Susan [name] aspirin EC 81 MG tablet Take 81 mg by mouth daily.    Susan [name] atorvastatin (LIPITOR) 80 MG tablet TAKE 1 TABLET BY MOUTH AT 6PM 30 tablet 0  . BRILINTA 60 MG TABS tablet TAKE 1 TABLET (60 MG TOTAL) BY MOUTH 2 (TWO) TIMES DAILY. 60 tablet 5  . dexamethasone (DECADRON) 4 MG tablet Take 1 tablet (4 mg total) by mouth daily. Take one tablet off Decadron (4 mg) twice a day before each chemotherapy 20 tablet 1  . ezetimibe (ZETIA) 10 MG tablet Take 1 tablet (10 mg total) by mouth daily. 30 tablet 11  . furosemide (LASIX) 20 MG tablet TAKE 1 TABLET BY MOUTH DAILY. 90 tablet 2  . isosorbide mononitrate (IMDUR) 30 MG 24 hr tablet Take 1 tablet (30 mg total) by mouth daily. 30 tablet 11  . lidocaine-prilocaine (EMLA) cream Apply to affected area once 30 g 3  . losartan (COZAAR) 50 MG tablet Take 1 tablet (50 mg total) by mouth daily. 90 tablet 3  . Multiple Vitamin (MULTIVITAMIN WITH MINERALS) TABS Take 1 tablet by mouth daily.    . nitroGLYCERIN (NITROSTAT) 0.4 MG SL tablet Place 1 tablet (0.4 mg total) under the tongue every 5 (five) minutes x 3 doses as needed for chest pain. 25 tablet 3  . omeprazole (PRILOSEC) 20 MG capsule Take 20 mg by mouth daily.    . ondansetron (ZOFRAN) 8 MG tablet Take 1 tablet (8 mg total) by mouth 2 (two) times daily as needed for refractory nausea / vomiting. Start on day 3 after chemo. 30 tablet 1  . Vitamin D, Cholecalciferol, 1000 UNITS CAPS Take 1 capsule by mouth daily.     No current facility-administered medications  for this visit.    OBJECTIVE: PHYSICAL EXAM: Gen. status: Patient is alert oriented in mild distress because of postoperative pain Head exam was generally normal. There was no scleral icterus or corneal arcus. Mucous membranes were moist. Examination of the chest was unremarkable. There were no bony deformities, no asymmetry, and no other abnormalities. Cardiac: Systolic murmur.  Regular heart sounds. Abdomen: Soft.  Tenderness present.  Liver and spleen not palpable.  No masses palpable. Neurologically, the patient was awake, alert, and oriented to person, place and time. There were no obvious focal neurologic abnormalities. Examination of the skin revealed no evidence of significant rashes, suspicious appearing nevi or other concerning lesions. Lower extremity no edema Musculoskeletal system within normal limit  Filed Vitals:   05/12/15 0848  BP: 158/80  Pulse:  61  Temp: 96.2 F (35.7 C)     Body mass index is 37.54 kg/(m^2).    ECOG FS:1 - Symptomatic but completely ambulatory  LAB RESULTS:  No visits with results within 5 Day(s) from this visit. Latest known visit with results is:  Appointment on 04/28/2015  Component Date Value Ref Range Status  . WBC 04/28/2015 6.8  3.6 - 11.0 K/uL Final  . RBC 04/28/2015 4.50  3.80 - 5.20 MIL/uL Final  . Hemoglobin 04/28/2015 13.7  12.0 - 16.0 g/dL Final  . HCT 04/28/2015 40.0  35.0 - 47.0 % Final  . MCV 04/28/2015 88.9  80.0 - 100.0 fL Final  . MCH 04/28/2015 30.6  26.0 - 34.0 pg Final  . MCHC 04/28/2015 34.4  32.0 - 36.0 g/dL Final  . RDW 04/28/2015 13.3  11.5 - 14.5 % Final  . Platelets 04/28/2015 288  150 - 440 K/uL Final  . Neutrophils Relative % 04/28/2015 64   Final  . Neutro Abs 04/28/2015 4.4  1.4 - 6.5 K/uL Final  . Lymphocytes Relative 04/28/2015 22   Final  . Lymphs Abs 04/28/2015 1.5  1.0 - 3.6 K/uL Final  . Monocytes Relative 04/28/2015 9   Final  . Monocytes Absolute 04/28/2015 0.6  0.2 - 0.9 K/uL Final  .  Eosinophils Relative 04/28/2015 4   Final  . Eosinophils Absolute 04/28/2015 0.3  0 - 0.7 K/uL Final  . Basophils Relative 04/28/2015 1   Final  . Basophils Absolute 04/28/2015 0.1  0 - 0.1 K/uL Final  . Sodium 04/28/2015 135  135 - 145 mmol/L Final  . Potassium 04/28/2015 3.7  3.5 - 5.1 mmol/L Final  . Chloride 04/28/2015 103  101 - 111 mmol/L Final  . CO2 04/28/2015 26  22 - 32 mmol/L Final  . Glucose, Bld 04/28/2015 99  65 - 99 mg/dL Final  . BUN 04/28/2015 20  6 - 20 mg/dL Final  . Creatinine, Ser 04/28/2015 0.89  0.44 - 1.00 mg/dL Final  . Calcium 04/28/2015 9.4  8.9 - 10.3 mg/dL Final  . Total Protein 04/28/2015 7.2  6.5 - 8.1 g/dL Final  . Albumin 04/28/2015 4.2  3.5 - 5.0 g/dL Final  . AST 04/28/2015 21  15 - 41 U/L Final  . ALT 04/28/2015 14  14 - 54 U/L Final  . Alkaline Phosphatase 04/28/2015 91  38 - 126 U/L Final  . Total Bilirubin 04/28/2015 0.4  0.3 - 1.2 mg/dL Final  . GFR calc non Af Amer 04/28/2015 >60  >60 mL/min Final  . GFR calc Af Amer 04/28/2015 >60  >60 mL/min Final   Comment: (NOTE) The eGFR has been calculated using the CKD EPI equation. This calculation has not been validated in all clinical situations. eGFR's persistently <60 mL/min signify possible Chronic Kidney Disease.   . Anion gap 04/28/2015 6  5 - 15 Final  . Magnesium 04/28/2015 2.3  1.7 - 2.4 mg/dL Final  . CA 125 04/28/2015 82.5* 0.0 - 38.1 U/mL Final   Comment: (NOTE) Roche ECLIA methodology Performed At: [facility] [place], [place] [id number] Lindon Romp [name] CZ:[id number]      STUDIES: Pathology report as well as previous CT scan has been reviewed independently ASSESSMENT:  Endometrial carcinoma stage III C1 Lymphovascular invasion Endometrioid and serous carcinoma Regional lymph nodes are positive Aortic lymph nodes are negative washings were negative   PLAN:  No problem-specific assessment & plan notes found for this encounter.  had discussed  situation with GYN oncologist and had prolonged discussion with patient and family.  We proceed with salvage therapy with carboplatinum and Taxol threes-4 cycles followed by radiation therapy followed by additional 2-3 cycles of chemotherapy All the side effects of chemotherapy including myelosuppression, alopecia, nausea vomiting fatigue weakness.  Secondary infection, and   peripheral neuropathy .  Has been discussed in details.  Considering  patient's old age and some comorbid condition will also consist consider preventative or prophylactic Neulasta Patient expressed understanding and was in agreement with this plan. She also understands that She can call clinic at any time with any questions, concerns, or complaints.    Carcinoma of endometrium [facility])   Staging form: Corpus Uteri - Carcinoma, AJCC 7th Edition     Clinical: Stage IIIC1 (T1a, N1, M0) - Signed by Forest Gleason, [name] on 04/30/2015   Forest Gleason, [name]   05/12/2015 9:15 AM

## 2015-05-15 ENCOUNTER — Other Ambulatory Visit: Payer: Self-pay | Admitting: Internal Medicine

## 2015-05-17 ENCOUNTER — Telehealth: Payer: Self-pay | Admitting: *Deleted

## 2015-05-17 NOTE — Telephone Encounter (Signed)
Called pt per MD request to see how she was feeling since first treatment last week. Pt stated had bone pain in legs after neulasta injection was given. Pt is feeling better today but is still having tingling in both feet. Instructed pt to monitor tingling in both feet and to call back if not any better by tomorrow (2/21). If patient continues to have numbness in both feet MD would like to start pt on gabapentin.

## 2015-05-19 ENCOUNTER — Telehealth: Payer: Self-pay | Admitting: *Deleted

## 2015-05-19 ENCOUNTER — Telehealth: Payer: Self-pay | Admitting: Cardiovascular Disease

## 2015-05-19 ENCOUNTER — Inpatient Hospital Stay: Payer: Medicare Other

## 2015-05-19 DIAGNOSIS — C541 Malignant neoplasm of endometrium: Secondary | ICD-10-CM

## 2015-05-19 LAB — CBC WITH DIFFERENTIAL/PLATELET
BASOS PCT: 1 %
Basophils Absolute: 0.2 10*3/uL — ABNORMAL HIGH (ref 0–0.1)
EOS ABS: 0.5 10*3/uL (ref 0–0.7)
Eosinophils Relative: 3 %
HCT: 38 % (ref 35.0–47.0)
HEMOGLOBIN: 12.7 g/dL (ref 12.0–16.0)
LYMPHS ABS: 2.6 10*3/uL (ref 1.0–3.6)
LYMPHS PCT: 15 %
MCH: 30.1 pg (ref 26.0–34.0)
MCHC: 33.4 g/dL (ref 32.0–36.0)
MCV: 89.9 fL (ref 80.0–100.0)
MONOS PCT: 11 %
Monocytes Absolute: 1.9 10*3/uL — ABNORMAL HIGH (ref 0.2–0.9)
NEUTROS ABS: 11.9 10*3/uL — AB (ref 1.4–6.5)
Neutrophils Relative %: 70 %
PLATELETS: 125 10*3/uL — AB (ref 150–440)
RBC: 4.22 MIL/uL (ref 3.80–5.20)
RDW: 13.3 % (ref 11.5–14.5)
WBC: 17.1 10*3/uL — ABNORMAL HIGH (ref 3.6–11.0)

## 2015-05-19 MED ORDER — GABAPENTIN 100 MG PO CAPS: 200.0000 mg | ORAL_CAPSULE | Freq: Two times a day (BID) | ORAL | Status: DC

## 2015-05-19 NOTE — Telephone Encounter (Signed)
Gabapentin 200 mg bid #60 tabs per VO Dr Oliva Bustard. Pt informed of this and not to drive after taking med as it will make her drowsy and dizzy. She stated she has not driven since her surgery

## 2015-05-19 NOTE — Telephone Encounter (Signed)
Returned call to patient.She stated she has been recently diagnosed with cancer and she is taking chemo therapy.Stated she was told to increase her fluid intake and she is up and down all night to the bathroom.Stated she is constantly taking cpap on and off wanting to know if she could just use a humidifier.Advised she needs to continue with cpap.

## 2015-05-19 NOTE — Telephone Encounter (Signed)
Please call,pt have some questions. She has cancer and it is hard for her to use her C-Pap machine at night.

## 2015-05-19 NOTE — Telephone Encounter (Signed)
Requesting that she be started on med for her neuropathy

## 2015-05-19 NOTE — Telephone Encounter (Signed)
opened in error

## 2015-05-26 ENCOUNTER — Inpatient Hospital Stay: Payer: Medicare Other | Attending: Oncology

## 2015-05-26 DIAGNOSIS — R5383 Other fatigue: Secondary | ICD-10-CM | POA: Diagnosis not present

## 2015-05-26 DIAGNOSIS — Z5111 Encounter for antineoplastic chemotherapy: Secondary | ICD-10-CM | POA: Insufficient documentation

## 2015-05-26 DIAGNOSIS — Z7689 Persons encountering health services in other specified circumstances: Secondary | ICD-10-CM | POA: Diagnosis not present

## 2015-05-26 DIAGNOSIS — R197 Diarrhea, unspecified: Secondary | ICD-10-CM | POA: Diagnosis not present

## 2015-05-26 DIAGNOSIS — G620 Drug-induced polyneuropathy: Secondary | ICD-10-CM | POA: Diagnosis not present

## 2015-05-26 DIAGNOSIS — T451X5S Adverse effect of antineoplastic and immunosuppressive drugs, sequela: Secondary | ICD-10-CM | POA: Diagnosis not present

## 2015-05-26 DIAGNOSIS — E785 Hyperlipidemia, unspecified: Secondary | ICD-10-CM | POA: Insufficient documentation

## 2015-05-26 DIAGNOSIS — I10 Essential (primary) hypertension: Secondary | ICD-10-CM | POA: Insufficient documentation

## 2015-05-26 DIAGNOSIS — R531 Weakness: Secondary | ICD-10-CM | POA: Insufficient documentation

## 2015-05-26 DIAGNOSIS — A047 Enterocolitis due to Clostridium difficile: Secondary | ICD-10-CM | POA: Insufficient documentation

## 2015-05-26 DIAGNOSIS — C541 Malignant neoplasm of endometrium: Secondary | ICD-10-CM | POA: Insufficient documentation

## 2015-05-26 DIAGNOSIS — Z7982 Long term (current) use of aspirin: Secondary | ICD-10-CM | POA: Diagnosis not present

## 2015-05-26 DIAGNOSIS — Z95818 Presence of other cardiac implants and grafts: Secondary | ICD-10-CM | POA: Diagnosis not present

## 2015-05-26 DIAGNOSIS — Z79899 Other long term (current) drug therapy: Secondary | ICD-10-CM | POA: Diagnosis not present

## 2015-05-26 DIAGNOSIS — R262 Difficulty in walking, not elsewhere classified: Secondary | ICD-10-CM | POA: Diagnosis not present

## 2015-05-26 DIAGNOSIS — R6883 Chills (without fever): Secondary | ICD-10-CM | POA: Diagnosis not present

## 2015-05-26 DIAGNOSIS — I2510 Atherosclerotic heart disease of native coronary artery without angina pectoris: Secondary | ICD-10-CM | POA: Diagnosis not present

## 2015-05-26 DIAGNOSIS — Z780 Asymptomatic menopausal state: Secondary | ICD-10-CM | POA: Diagnosis not present

## 2015-05-26 DIAGNOSIS — I252 Old myocardial infarction: Secondary | ICD-10-CM | POA: Diagnosis not present

## 2015-05-26 DIAGNOSIS — R509 Fever, unspecified: Secondary | ICD-10-CM | POA: Diagnosis not present

## 2015-05-26 DIAGNOSIS — Z803 Family history of malignant neoplasm of breast: Secondary | ICD-10-CM | POA: Insufficient documentation

## 2015-05-26 LAB — CBC WITH DIFFERENTIAL/PLATELET
BASOS ABS: 0.1 10*3/uL (ref 0–0.1)
Basophils Relative: 1 %
EOS ABS: 0.1 10*3/uL (ref 0–0.7)
EOS PCT: 1 %
HCT: 33.9 % — ABNORMAL LOW (ref 35.0–47.0)
HEMOGLOBIN: 11.4 g/dL — AB (ref 12.0–16.0)
LYMPHS ABS: 1.8 10*3/uL (ref 1.0–3.6)
Lymphocytes Relative: 14 %
MCH: 30.1 pg (ref 26.0–34.0)
MCHC: 33.7 g/dL (ref 32.0–36.0)
MCV: 89.4 fL (ref 80.0–100.0)
Monocytes Absolute: 1.2 10*3/uL — ABNORMAL HIGH (ref 0.2–0.9)
Monocytes Relative: 10 %
NEUTROS PCT: 76 %
Neutro Abs: 9.9 10*3/uL — ABNORMAL HIGH (ref 1.4–6.5)
PLATELETS: 214 10*3/uL (ref 150–440)
RBC: 3.79 MIL/uL — AB (ref 3.80–5.20)
RDW: 13.6 % (ref 11.5–14.5)
WBC: 13.2 10*3/uL — AB (ref 3.6–11.0)

## 2015-06-01 ENCOUNTER — Encounter: Payer: Self-pay | Admitting: Cardiovascular Disease

## 2015-06-01 ENCOUNTER — Ambulatory Visit (INDEPENDENT_AMBULATORY_CARE_PROVIDER_SITE_OTHER): Payer: Medicare Other | Admitting: Cardiovascular Disease

## 2015-06-01 VITALS — BP 141/70 | HR 66 | Ht 63.5 in | Wt 209.3 lb

## 2015-06-01 DIAGNOSIS — Z79899 Other long term (current) drug therapy: Secondary | ICD-10-CM | POA: Diagnosis not present

## 2015-06-01 DIAGNOSIS — E785 Hyperlipidemia, unspecified: Secondary | ICD-10-CM | POA: Diagnosis not present

## 2015-06-01 DIAGNOSIS — I10 Essential (primary) hypertension: Secondary | ICD-10-CM | POA: Diagnosis not present

## 2015-06-01 NOTE — Patient Instructions (Signed)
Your physician recommends that you schedule a follow-up appointment in: 6 months for cardiology appointment and 1 year for sleep with Dr Claiborne Billings.  Your physician recommends that you return for lab work.

## 2015-06-02 ENCOUNTER — Inpatient Hospital Stay: Payer: Medicare Other

## 2015-06-02 ENCOUNTER — Inpatient Hospital Stay (HOSPITAL_BASED_OUTPATIENT_CLINIC_OR_DEPARTMENT_OTHER): Payer: Medicare Other | Admitting: Oncology

## 2015-06-02 ENCOUNTER — Encounter: Payer: Self-pay | Admitting: Oncology

## 2015-06-02 VITALS — BP 147/77 | HR 74 | Temp 97.0°F | Resp 20

## 2015-06-02 VITALS — BP 169/73 | HR 67 | Temp 96.3°F | Resp 18 | Wt 208.3 lb

## 2015-06-02 DIAGNOSIS — R531 Weakness: Secondary | ICD-10-CM | POA: Diagnosis not present

## 2015-06-02 DIAGNOSIS — Z79899 Other long term (current) drug therapy: Secondary | ICD-10-CM

## 2015-06-02 DIAGNOSIS — Z7982 Long term (current) use of aspirin: Secondary | ICD-10-CM

## 2015-06-02 DIAGNOSIS — R5383 Other fatigue: Secondary | ICD-10-CM

## 2015-06-02 DIAGNOSIS — I2510 Atherosclerotic heart disease of native coronary artery without angina pectoris: Secondary | ICD-10-CM

## 2015-06-02 DIAGNOSIS — I252 Old myocardial infarction: Secondary | ICD-10-CM

## 2015-06-02 DIAGNOSIS — I10 Essential (primary) hypertension: Secondary | ICD-10-CM

## 2015-06-02 DIAGNOSIS — E785 Hyperlipidemia, unspecified: Secondary | ICD-10-CM

## 2015-06-02 DIAGNOSIS — G63 Polyneuropathy in diseases classified elsewhere: Secondary | ICD-10-CM

## 2015-06-02 DIAGNOSIS — Z95818 Presence of other cardiac implants and grafts: Secondary | ICD-10-CM

## 2015-06-02 DIAGNOSIS — T451X5S Adverse effect of antineoplastic and immunosuppressive drugs, sequela: Secondary | ICD-10-CM

## 2015-06-02 DIAGNOSIS — C541 Malignant neoplasm of endometrium: Secondary | ICD-10-CM

## 2015-06-02 DIAGNOSIS — G620 Drug-induced polyneuropathy: Secondary | ICD-10-CM

## 2015-06-02 DIAGNOSIS — C801 Malignant (primary) neoplasm, unspecified: Secondary | ICD-10-CM | POA: Insufficient documentation

## 2015-06-02 LAB — CBC WITH DIFFERENTIAL/PLATELET
BASOS ABS: 0 10*3/uL (ref 0–0.1)
BASOS PCT: 0 %
EOS ABS: 0 10*3/uL (ref 0–0.7)
Eosinophils Relative: 0 %
HEMATOCRIT: 33.4 % — AB (ref 35.0–47.0)
HEMOGLOBIN: 11.6 g/dL — AB (ref 12.0–16.0)
Lymphocytes Relative: 6 %
Lymphs Abs: 0.9 10*3/uL — ABNORMAL LOW (ref 1.0–3.6)
MCH: 31 pg (ref 26.0–34.0)
MCHC: 34.7 g/dL (ref 32.0–36.0)
MCV: 89.4 fL (ref 80.0–100.0)
Monocytes Absolute: 0.9 10*3/uL (ref 0.2–0.9)
Monocytes Relative: 6 %
NEUTROS ABS: 14 10*3/uL — AB (ref 1.4–6.5)
NEUTROS PCT: 88 %
PLATELETS: 423 10*3/uL (ref 150–440)
RBC: 3.74 MIL/uL — ABNORMAL LOW (ref 3.80–5.20)
RDW: 14.1 % (ref 11.5–14.5)
WBC: 15.8 10*3/uL — AB (ref 3.6–11.0)

## 2015-06-02 LAB — COMPREHENSIVE METABOLIC PANEL
ALT: 22 U/L (ref 14–54)
ANION GAP: 6 (ref 5–15)
AST: 23 U/L (ref 15–41)
Albumin: 3.7 g/dL (ref 3.5–5.0)
Alkaline Phosphatase: 93 U/L (ref 38–126)
BILIRUBIN TOTAL: 0.7 mg/dL (ref 0.3–1.2)
BUN: 23 mg/dL — ABNORMAL HIGH (ref 6–20)
CHLORIDE: 105 mmol/L (ref 101–111)
CO2: 25 mmol/L (ref 22–32)
Calcium: 9.4 mg/dL (ref 8.9–10.3)
Creatinine, Ser: 0.68 mg/dL (ref 0.44–1.00)
Glucose, Bld: 121 mg/dL — ABNORMAL HIGH (ref 65–99)
POTASSIUM: 3.6 mmol/L (ref 3.5–5.1)
Sodium: 136 mmol/L (ref 135–145)
TOTAL PROTEIN: 6.7 g/dL (ref 6.5–8.1)

## 2015-06-02 LAB — MAGNESIUM: MAGNESIUM: 2.3 mg/dL (ref 1.7–2.4)

## 2015-06-02 MED ORDER — PEGFILGRASTIM 6 MG/0.6ML ~~LOC~~ PSKT
6.0000 mg | PREFILLED_SYRINGE | Freq: Once | SUBCUTANEOUS | Status: AC
  Administered 2015-06-02: 6 mg via SUBCUTANEOUS
  Filled 2015-06-02: qty 0.6

## 2015-06-02 MED ORDER — SODIUM CHLORIDE 0.9 % IV SOLN
472.5000 mg | Freq: Once | INTRAVENOUS | Status: AC
  Administered 2015-06-02: 470 mg via INTRAVENOUS
  Filled 2015-06-02: qty 47

## 2015-06-02 MED ORDER — SODIUM CHLORIDE 0.9 % IV SOLN
175.0000 mg/m2 | Freq: Once | INTRAVENOUS | Status: AC
  Administered 2015-06-02: 360 mg via INTRAVENOUS
  Filled 2015-06-02: qty 60

## 2015-06-02 MED ORDER — HEPARIN SOD (PORK) LOCK FLUSH 100 UNIT/ML IV SOLN
500.0000 [IU] | Freq: Once | INTRAVENOUS | Status: AC
  Administered 2015-06-02: 500 [IU] via INTRAVENOUS
  Filled 2015-06-02: qty 5

## 2015-06-02 MED ORDER — FAMOTIDINE IN NACL 20-0.9 MG/50ML-% IV SOLN
20.0000 mg | Freq: Once | INTRAVENOUS | Status: AC
  Administered 2015-06-02: 20 mg via INTRAVENOUS
  Filled 2015-06-02: qty 50

## 2015-06-02 MED ORDER — PACLITAXEL CHEMO INJECTION 300 MG/50ML: 175.0000 mg/m2 | Freq: Once | INTRAVENOUS | Status: DC

## 2015-06-02 MED ORDER — PALONOSETRON HCL INJECTION 0.25 MG/5ML
0.2500 mg | Freq: Once | INTRAVENOUS | Status: AC
  Administered 2015-06-02: 0.25 mg via INTRAVENOUS
  Filled 2015-06-02: qty 5

## 2015-06-02 MED ORDER — SODIUM CHLORIDE 0.9 % IV SOLN
Freq: Once | INTRAVENOUS | Status: AC
  Administered 2015-06-02: 12:00:00 via INTRAVENOUS
  Filled 2015-06-02: qty 1000

## 2015-06-02 MED ORDER — HEPARIN SOD (PORK) LOCK FLUSH 100 UNIT/ML IV SOLN: 500.0000 [IU] | Freq: Once | INTRAVENOUS | Status: DC | PRN

## 2015-06-02 MED ORDER — SODIUM CHLORIDE 0.9% FLUSH
10.0000 mL | INTRAVENOUS | Status: DC | PRN
  Administered 2015-06-02: 10 mL via INTRAVENOUS
  Filled 2015-06-02: qty 10

## 2015-06-02 MED ORDER — DEXTROSE 5 % IV SOLN: 175.0000 mg/m2 | Freq: Once | INTRAVENOUS | Status: DC

## 2015-06-02 MED ORDER — DIPHENHYDRAMINE HCL 50 MG/ML IJ SOLN
50.0000 mg | Freq: Once | INTRAMUSCULAR | Status: AC
  Administered 2015-06-02: 50 mg via INTRAVENOUS
  Filled 2015-06-02: qty 1

## 2015-06-02 MED ORDER — SODIUM CHLORIDE 0.9 % IV SOLN
20.0000 mg | Freq: Once | INTRAVENOUS | Status: AC
  Administered 2015-06-02: 20 mg via INTRAVENOUS
  Filled 2015-06-02: qty 2

## 2015-06-02 NOTE — Progress Notes (Signed)
Patient continues to have neuropathy in the fingers and feet.

## 2015-06-02 NOTE — Progress Notes (Addendum)
[place] @ [facility] [phone]  [phone] [place]: Jul 26, 1934  MR#: [id number]  XNT#:[id number]  Patient Care Team: [name], MD as PCP - General (Family Medicine)  CHIEF COMPLAINT:  Chief Complaint  Patient presents with  . Endometrial cancer   endometrial cancer stage IIIc 1  VISIT DIAGNOSIS:     ICD-9-CM ICD-10-CM   1. Endometrial cancer (HCC) 182.0 C54.1 Comprehensive metabolic panel     Magnesium      No history exists.    Oncology Flowsheet 05/12/2015 06/02/2015  Day, Cycle Day 1, Cycle 1 Day 1, Cycle 2  CARBOplatin (PARAPLATIN) IV 470 mg -  dexamethasone (DECADRON) IV 20 mg 20 mg  PACLitaxel (TAXOL) IV 175 mg/m2 175 mg/m2  palonosetron (ALOXI) IV 0.25 mg 0.25 mg  pegfilgrastim (NEULASTA ONPRO KIT) [place] 6 mg -    INTERVAL HISTORY: After Thanksgiving, for at least 7-10 days, she had noted dried blood on her pad. She did a U/A and took Cipro for 3 days but culture was negative for UTI. 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 She is still having the spotting. No itching or vaginal discharge. Denies pelvic pain.   Agent is here to initiate second cycle of chemotherapy.  After first cycle of chemotherapy patient had burning in both lower extremity and tingling and numbness.  Which gradually recovered.\This has been started on gabapentin which has been now increased to 3 tablets a day And alopecia  No stomatitis. REVIEW OF SYSTEMS:    general status: Patient is feeling weak and tired.  No change in a performance status.  No chills.  No fever.  HEENT: No headache or dizziness. Lungs: No cough or shortness of breath Cardiac: No chest pain or paroxysmal nocturnal dyspnea A senna has a history of coronary artery disease and stent placement GI: No nausea no vomiting no diarrhea no abdominal pain Skin: No rash Lower extremity no swelling Neurological system: As  described about patient had burning tingling in both lower extremity and minimally in upper extremity after last chemotherapy was given Neurontin which has helped.  Patient is taking twice a day which will be increased to 3 times a day Musculoskeletal system no bony pains GU: Patient had a recent surgery is some abdominal discomfort. Nausea vomiting diarrhea or rectal bleeding  As per HPI. Otherwise, a complete review of systems is negatve.  PAST MEDICAL HISTORY: Past Medical History  Diagnosis Date  . Hypertension   . Coronary artery disease   . Hyperlipidemia   . NSVT (nonsustained ventricular tachycardia) ([place])   . Bradycardia   . Uterine cancer ([place])     serrous  . Myocardial infarction ([place])   . Heart murmur     PAST SURGICAL HISTORY: Past Surgical History  Procedure Laterality Date  . Left heart catheterization with coronary angiogram  10/19/2012    Procedure: LEFT HEART CATHETERIZATION WITH CORONARY ANGIOGRAM;  Surgeon: Troy Sine, MD;  Location: [facility] CATH LAB;  Service: Cardiovascular;;  . Percutaneous coronary stent intervention (pci-s)  10/19/2012    Procedure: PERCUTANEOUS CORONARY STENT INTERVENTION (PCI-S);  Surgeon: Troy Sine, MD;  Location: [facility] CATH LAB;  Service: Cardiovascular;;  . Percutaneous coronary stent intervention (pci-s) N/A 10/23/2012    Procedure: PERCUTANEOUS CORONARY STENT INTERVENTION (PCI-S);  Surgeon: Troy Sine, MD;  Location: [facility] CATH LAB;  Service: Cardiovascular;  Laterality: N/A;  . Abdominal hysterectomy    . Peripheral vascular  catheterization N/A 05/05/2015    Procedure: Glori Luis Cath Insertion;  Surgeon: Algernon Huxley, MD;  Location: [facility] CV LAB;  Service: Cardiovascular;  Laterality: N/A;    FAMILY HISTORY Family History  Problem Relation Age of Onset  . Heart disease Mother   . Breast cancer Mother   . Heart disease Father   . Diabetes Father   . Diabetes Brother   . Stroke Brother         ADVANCED DIRECTIVES:    Patient does not have any living will or healthcare power of attorney.  Information was given .  Available resources had been discussed.  We will follow-up on subsequent appointments regarding this issue  HEALTH MAINTENANCE: Social History  Substance Use Topics  . Smoking status: Never Smoker   . Smokeless tobacco: Never Used  . Alcohol Use: 0.6 oz/week    1 Glasses of wine per week      Allergies  Allergen Reactions  . Penicillins Rash    Current Outpatient Prescriptions  Medication Sig Dispense Refill  . acetaminophen (TYLENOL) 500 MG tablet Take 500 mg by mouth every 6 (six) hours as needed.    [name] aspirin EC 81 MG tablet Take 81 mg by mouth daily.    [name] atorvastatin (LIPITOR) 80 MG tablet TAKE 1 TABLET BY MOUTH AT 6PM 30 tablet 0  . BRILINTA 60 MG TABS tablet TAKE 1 TABLET (60 MG TOTAL) BY MOUTH 2 (TWO) TIMES DAILY. 60 tablet 5  . dexamethasone (DECADRON) 4 MG tablet Take 1 tablet (4 mg total) by mouth daily. Take one tablet off Decadron (4 mg) twice a day before each chemotherapy 20 tablet 1  . ezetimibe (ZETIA) 10 MG tablet Take 1 tablet (10 mg total) by mouth daily. 30 tablet 11  . furosemide (LASIX) 20 MG tablet TAKE 1 TABLET BY MOUTH DAILY. 90 tablet 2  . gabapentin (NEURONTIN) 100 MG capsule Take 2 capsules (200 mg total) by mouth 2 (two) times daily. 120 capsule 0  . isosorbide mononitrate (IMDUR) 30 MG 24 hr tablet Take 1 tablet (30 mg total) by mouth daily. 30 tablet 11  . lidocaine-prilocaine (EMLA) cream Apply to affected area once 30 g 3  . losartan (COZAAR) 50 MG tablet Take 1 tablet (50 mg total) by mouth daily. 90 tablet 3  . Multiple Vitamin (MULTIVITAMIN WITH MINERALS) TABS Take 1 tablet by mouth daily.    . nitroGLYCERIN (NITROSTAT) 0.4 MG SL tablet Place 1 tablet (0.4 mg total) under the tongue every 5 (five) minutes x 3 doses as needed for chest pain. 25 tablet 3  . omeprazole (PRILOSEC) 20 MG capsule Take 20 mg by mouth daily.    . ondansetron (ZOFRAN) 8 MG  tablet Take 1 tablet (8 mg total) by mouth 2 (two) times daily as needed for refractory nausea / vomiting. Start on day 3 after chemo. 30 tablet 1  . Vitamin D, Cholecalciferol, 1000 UNITS CAPS Take 1 capsule by mouth daily.     No current facility-administered medications for this visit.   Facility-Administered Medications Ordered in Other Visits  Medication Dose Route Frequency Provider Last Rate Last Dose  . CARBOplatin (PARAPLATIN) 470 mg in sodium chloride 0.9 % 250 mL chemo infusion  470 mg Intravenous Once Forest Gleason, MD      . heparin lock flush 100 unit/mL  500 Units Intravenous Once Forest Gleason, MD      . heparin lock flush 100 unit/mL  500 Units Intracatheter Once PRN Forest Gleason,  MD      . PACLitaxel (TAXOL) 360 mg in sodium chloride 0.9 % 500 mL chemo infusion (> 54m/m2)  175 mg/m2 (Treatment Plan Actual) Intravenous Once JForest Gleason MD 187 mL/hr at 06/02/15 1256 360 mg at 06/02/15 1256  . pegfilgrastim (NEULASTA ONPRO KIT) injection 6 mg  6 mg Subcutaneous Once JForest Gleason MD      . sodium chloride flush (NS) 0.9 % injection 10 mL  10 mL Intravenous PRN JForest Gleason MD   10 mL at 06/02/15 1011    OBJECTIVE: PHYSICAL EXAM: Gen. status: Patient is alert oriented in mild distress because of postoperative pain Head exam was generally normal. There was no scleral icterus or corneal arcus. Mucous membranes were moist. Examination of the chest was unremarkable. There were no bony deformities, no asymmetry, and no other abnormalities. Cardiac: Systolic murmur.  Regular heart sounds. Abdomen: Soft.  Tenderness present.  Liver and spleen not palpable.  No masses palpable. Neurologically, Patient has burning and tingling in both lower extremity Grade 1 neuropathy without any motor loss Started on Neurontin to be increased to 3 times a week Examination of the skin revealed no evidence of significant rashes, suspicious appearing nevi or other concerning lesions. Lower extremity  no edema Musculoskeletal system within normal limit  Filed Vitals:   06/02/15 1053  BP: 169/73  Pulse: 67  Temp: 96.3 F (35.7 C)  Resp: 18     Body mass index is 36.32 kg/(m^2).    ECOG FS:1 - Symptomatic but completely ambulatory  LAB RESULTS:  Infusion on 06/02/2015  Component Date Value Ref Range Status  . WBC 06/02/2015 15.8* 3.6 - 11.0 K/uL Final  . RBC 06/02/2015 3.74* 3.80 - 5.20 MIL/uL Final  . Hemoglobin 06/02/2015 11.6* 12.0 - 16.0 g/dL Final  . HCT 06/02/2015 33.4* 35.0 - 47.0 % Final  . MCV 06/02/2015 89.4  80.0 - 100.0 fL Final  . MCH 06/02/2015 31.0  26.0 - 34.0 pg Final  . MCHC 06/02/2015 34.7  32.0 - 36.0 g/dL Final  . RDW 06/02/2015 14.1  11.5 - 14.5 % Final  . Platelets 06/02/2015 423  150 - 440 K/uL Final  . Neutrophils Relative % 06/02/2015 88   Final  . Neutro Abs 06/02/2015 14.0* 1.4 - 6.5 K/uL Final  . Lymphocytes Relative 06/02/2015 6   Final  . Lymphs Abs 06/02/2015 0.9* 1.0 - 3.6 K/uL Final  . Monocytes Relative 06/02/2015 6   Final  . Monocytes Absolute 06/02/2015 0.9  0.2 - 0.9 K/uL Final  . Eosinophils Relative 06/02/2015 0   Final  . Eosinophils Absolute 06/02/2015 0.0  0 - 0.7 K/uL Final  . Basophils Relative 06/02/2015 0   Final  . Basophils Absolute 06/02/2015 0.0  0 - 0.1 K/uL Final  . Sodium 06/02/2015 136  135 - 145 mmol/L Final  . Potassium 06/02/2015 3.6  3.5 - 5.1 mmol/L Final  . Chloride 06/02/2015 105  101 - 111 mmol/L Final  . CO2 06/02/2015 25  22 - 32 mmol/L Final  . Glucose, Bld 06/02/2015 121* 65 - 99 mg/dL Final  . BUN 06/02/2015 23* 6 - 20 mg/dL Final  . Creatinine, Ser 06/02/2015 0.68  0.44 - 1.00 mg/dL Final  . Calcium 06/02/2015 9.4  8.9 - 10.3 mg/dL Final  . Total Protein 06/02/2015 6.7  6.5 - 8.1 g/dL Final  . Albumin 06/02/2015 3.7  3.5 - 5.0 g/dL Final  . AST 06/02/2015 23  15 - 41 U/L Final  . ALT  06/02/2015 22  14 - 54 U/L Final  . Alkaline Phosphatase 06/02/2015 93  38 - 126 U/L Final  . Total Bilirubin  06/02/2015 0.7  0.3 - 1.2 mg/dL Final  . GFR calc non Af Amer 06/02/2015 >60  >60 mL/min Final  . GFR calc Af Amer 06/02/2015 >60  >60 mL/min Final   Comment: (NOTE) The eGFR has been calculated using the CKD EPI equation. This calculation has not been validated in all clinical situations. eGFR's persistently <60 mL/min signify possible Chronic Kidney Disease.   . Anion gap 06/02/2015 6  5 - 15 Final  . Magnesium 06/02/2015 2.3  1.7 - 2.4 mg/dL Final     STUDIES: Pathology report as well as previous CT scan has been reviewed independently ASSESSMENT:  Endometrial carcinoma stage III C1 Lymphovascular invasion Endometrioid and serous carcinoma Regional lymph nodes are positive Aortic lymph nodes are negative washings were negative   PLAN:  Proceed with second cycle of chemotherapy.  . Peripheral  neuropathy grade 1 Patient was advised to continue Neurontin 3 times a day If patient has still burning and tingling the dose of Taxol can be reduced..  All lab data has been reviewed. Continue Neulasta Patient expressed understanding and was in agreement with this plan. She also understands that She can call clinic at any time with any questions, concerns, or complaints.    Carcinoma of endometrium [facility])   Staging form: Corpus Uteri - Carcinoma, AJCC 7th Edition     Clinical: Stage IIIC1 (T1a, N1, M0) - Signed by Forest Gleason, MD on 04/30/2015   Forest Gleason, MD   06/02/2015 1:42 PM

## 2015-06-03 ENCOUNTER — Encounter: Payer: Self-pay | Admitting: Cardiovascular Disease

## 2015-06-03 LAB — CA 125: CA 125: 22 U/mL (ref 0.0–38.1)

## 2015-06-03 NOTE — Progress Notes (Signed)
Patient ID: Susan [name], female   DOB: 17-May-1934, 81 y.o.   MRN: [id number]      HPI: Susan [name] is a 81 y.o. female who presents to the office today for a  follow-up cardiology and sleep clinic evaluation.  Susan [name] is originally from [place], [place] and now resides in [place]. On 10/19/2012 she presented [facility] in transfer from [facility] with an anterior wall ST segment elevation myocardial infarction. She had chest pain of greater than 12 hours duration. I performed emergent cardiac catheterization  where her LAD was found to be totally occluded.  She underwent stenting of her LAD extending from the ostium to mid region with 3.0x38 mm and 3.0x16 mm Promus Premier DES stents. Initially she had significant wall motion abnormality involving the LAD territory. She also was found to have a 95% mid AV groove circumflex stenosis.  On 10/23/2012 she underwent successful staged intervention with insertion of a 2.25x12 mm Promus premier DES stent into the circumflex vessel. Subsequently she had done well. She did develop some hypotension leading to reduction in some of her medications.  She was enrolled in the [place] cardiac rehabilitation program. An exercise Myoview study on 12/03/2012 demonstrated significant salvage of myocardium with only a small area of distal antero-apical, apical, septal and  apical scar without evidence for ischemia. Post-rest ejection fraction was 66% and there was evidence for apical akinesis. Laboratory revealed an increased LDL particle number at 1352 despite being on atorvastatin 80 mg. She was contacted and Zetia 10 mg was just added to this atorvastatin dose. She did have 735 small LDL particles and a calculated LDL of 90 with HDL 40 and triglycerides 119.  She had developed hematuria and was found to have left kidney stones.  She underwent lithotripsy for her multiple kidney stones. A nuclear perfusion study prior to potential  discontinuance of dual anti-platelet therapy on 11/05/2013  was interpreted as low risk, but did show extensive scar in the distal LAD to return distribution.  Post-rest ejection fraction was 40%.  There was evidence for apical akinesis/mild dyskinesis.  She has been documented to have episodes of nonsustained ventricular tachycardia and has seen Dr. commits..  Her bradycardia has been more profound while sleeping.  She was able to exercise and walk without limitation other than back discomfort.  She was evaluated for possible indication for pacemaker was not felt that a pacemaker is indicated presently.  Because of her bradycardia she has not been on beta blocker therapy.  She has had episodes of nonsustained VT noted on a CardioNet monitor.  When I I last saw her admited that her sleep was poor and nonrestorative.  She snored loudly and required frequent daytime naps.  He was sleeping at least 8 hours per night, but poor quality.  Referred her for a sleep study which was done in a split-night protocol.  She was found to have severe sleep apnea with an AHI of 35.8.  Overall and more severe with rems sleep with an AHI of 67.5 per hour.  She had significant oxygen desaturation to a nadir of 77%.  There was loud snoring.  I recommended tai chi CPAP therapy at 9 cm water pressure.  Since initiating CPAP therapy.  She has felt well.  She is no longer having to take naps.  She is using choice for DME company.  A download was obtained from 05/01/2015 through 05/30/2015.  She is meeting Medicare compliance with usage stays at 93%  and 70% of days greater than 4 hours.  Her AHI was excellent at 1.9 with a set pressure of 9 cm.  Some of her initial use was complicated by her concomitant disease which she had developed.  Since I had seen her, she was diagnosed with endometrial cancer.  She required a total hysterectomy.  She also has a Port-A-Cath that has been undergoing chemotherapy.  She also has had radiation.  She has  developed a neuropathy for which he takes gabapentin.  He is unaware of chest pain or palpitations.  She presents for evaluation.  Past Medical History  Diagnosis Date  . Hypertension   . Coronary artery disease   . Hyperlipidemia   . NSVT (nonsustained ventricular tachycardia) (HCC)   . Bradycardia   . Uterine cancer (HCC)     serrous  . Myocardial infarction (HCC)   . Heart murmur     Past Surgical History  Procedure Laterality Date  . Left heart catheterization with coronary angiogram  10/19/2012    Procedure: LEFT HEART CATHETERIZATION WITH CORONARY ANGIOGRAM;  Surgeon: Lennette Bihari, MD;  Location: [facility] CATH LAB;  Service: Cardiovascular;;  . Percutaneous coronary stent intervention (pci-s)  10/19/2012    Procedure: PERCUTANEOUS CORONARY STENT INTERVENTION (PCI-S);  Surgeon: Lennette Bihari, MD;  Location: [facility] CATH LAB;  Service: Cardiovascular;;  . Percutaneous coronary stent intervention (pci-s) N/A 10/23/2012    Procedure: PERCUTANEOUS CORONARY STENT INTERVENTION (PCI-S);  Surgeon: Lennette Bihari, MD;  Location: [facility] CATH LAB;  Service: Cardiovascular;  Laterality: N/A;  . Abdominal hysterectomy    . Peripheral vascular catheterization N/A 05/05/2015    Procedure: Shelda Pal Cath Insertion;  Surgeon: Annice Needy, MD;  Location: [facility] INVASIVE CV LAB;  Service: Cardiovascular;  Laterality: N/A;    Allergies  Allergen Reactions  . Penicillins Rash    Current Outpatient Prescriptions  Medication Sig Dispense Refill  . acetaminophen (TYLENOL) 500 MG tablet Take 500 mg by mouth every 6 (six) hours as needed.    [name] aspirin EC 81 MG tablet Take 81 mg by mouth daily.    [name] atorvastatin (LIPITOR) 80 MG tablet TAKE 1 TABLET BY MOUTH AT 6PM 30 tablet 0  . BRILINTA 60 MG TABS tablet TAKE 1 TABLET (60 MG TOTAL) BY MOUTH 2 (TWO) TIMES DAILY. 60 tablet 5  . dexamethasone (DECADRON) 4 MG tablet Take 1 tablet (4 mg total) by mouth daily. Take one tablet off Decadron (4 mg) twice a day before each  chemotherapy 20 tablet 1  . ezetimibe (ZETIA) 10 MG tablet Take 1 tablet (10 mg total) by mouth daily. 30 tablet 11  . furosemide (LASIX) 20 MG tablet TAKE 1 TABLET BY MOUTH DAILY. 90 tablet 2  . gabapentin (NEURONTIN) 100 MG capsule Take 2 capsules (200 mg total) by mouth 2 (two) times daily. 120 capsule 0  . isosorbide mononitrate (IMDUR) 30 MG 24 hr tablet Take 1 tablet (30 mg total) by mouth daily. 30 tablet 11  . lidocaine-prilocaine (EMLA) cream Apply to affected area once 30 g 3  . losartan (COZAAR) 50 MG tablet Take 1 tablet (50 mg total) by mouth daily. 90 tablet 3  . Multiple Vitamin (MULTIVITAMIN WITH MINERALS) TABS Take 1 tablet by mouth daily.    . nitroGLYCERIN (NITROSTAT) 0.4 MG SL tablet Place 1 tablet (0.4 mg total) under the tongue every 5 (five) minutes x 3 doses as needed for chest pain. 25 tablet 3  . omeprazole (PRILOSEC) 20 MG capsule Take 20 mg  by mouth daily.    . ondansetron (ZOFRAN) 8 MG tablet Take 1 tablet (8 mg total) by mouth 2 (two) times daily as needed for refractory nausea / vomiting. Start on day 3 after chemo. 30 tablet 1  . Vitamin D, Cholecalciferol, 1000 UNITS CAPS Take 1 capsule by mouth daily.     No current facility-administered medications for this visit.    Social History   Social History  . Marital Status: Married    Spouse Name: N/A  . Number of Children: N/A  . Years of Education: N/A   Occupational History  . Not on file.   Social History Main Topics  . Smoking status: Never Smoker   . Smokeless tobacco: Never Used  . Alcohol Use: 0.6 oz/week    1 Glasses of wine per week  . Drug Use: No  . Sexual Activity: Yes   Other Topics Concern  . Not on file   Social History Narrative    Socially she is married. There is no tobacco use. She drinks alcohol rarely.  Family History  Problem Relation Age of Onset  . Heart disease Mother   . Breast cancer Mother   . Heart disease Father   . Diabetes Father   . Diabetes Brother   .  Stroke Brother     ROS General: Negative; No fevers, chills, or night sweats;  HEENT: Negative; No changes in vision or hearing, sinus congestion, difficulty swallowing Pulmonary: Negative; No cough, wheezing, shortness of breath, hemoptysis Cardiovascular: Negative; No chest pain, presyncope, syncope, palpatations GI: Positive for GERD; No nausea, vomiting, diarrhea, or abdominal pain GU: Positive for kidney stones, contributing to left flank pain intermittently; No dysuria, hematuria, or difficulty voiding Musculoskeletal: Negative; no myalgias, joint pain, or weakness Hematologic/Oncology: Negative; no easy bruising, bleeding Endocrine: Negative; no heat/cold intolerance; no diabetes Neuro: Negative; no changes in balance, headaches Skin: Negative; No rashes or skin lesions Psychiatric: Negative; No behavioral problems, depression Sleep: Negative; No snoring, daytime sleepiness, hypersomnolence, bruxism, restless legs, hypnogognic hallucinations, no cataplexy Other comprehensive 14 point system review is negative.   PE BP 141/70 mmHg  Pulse 66  Ht 5' 3.5" (1.613 m)  Wt 209 lb 4.8 oz (94.938 kg)  BMI 36.49 kg/m2  Repeat blood pressure by me 120/76.  Wt Readings from Last 3 Encounters:  06/02/15 208 lb 5.4 oz (94.5 kg)  06/01/15 209 lb 4.8 oz (94.938 kg)  05/12/15 211 lb 13.8 oz (96.1 kg)   General: Alert, oriented, no distress. She was wearing a wig since she has lost her hair secondary to the chemotherapy. Skin: normal turgor, no rashes HEENT: Normocephalic, atraumatic. Pupils round and reactive; sclera anicteric;no lid lag.  Nose without nasal septal hypertrophy Mouth/Parynx benign; Mallinpatti scale 3 Neck: No JVD, no carotid briuts; normal upstroke Lungs: clear to ausculatation and percussion; no wheezing or rales Chest wall: No tenderness to palpation Heart: RRR, s1 s2 normal 1/6 systolic murmur, no S3 or S4 gallop.  No diastolic.  No rubs thrills or heaves. Abdomen:  soft, nontender; no hepatosplenomehaly, BS+; abdominal aorta nontender and not dilated by palpation. Back: No CVA tenderness Pulses 2+ Extremities: no clubbing cyanosis or edema, Homan's sign negative  Neurologic: grossly nonfocal Psychological: Normal affect and mood  ECG (independently read by me): Sinus bradycardia 52 bpm with first-degree block.  PR interval 284 ms.  LVH by voltage.  QS V1 through V3 concordant with old anteroseptal MI.  March 2016 ECG (independently read by me): Normal sinus rhythm.  Old anteroseptal MI.  First-degree AV block.  October 27, 2013 ECG (independently read by me): Normal sinus rhythm at 60 beats per minute.  First degree AV block with PR interval at 276 ms.  Old anteroseptal MI with QS complex in V1 and V2.  EKG (independently read by me): Sinus rhythm at 55 beats per minute. First degree AV block. Poor progression compatible with her prior septal infarction. T-wave changes anterolaterally.  LABS: BMP Latest Ref Rng 06/02/2015 04/28/2015 03/30/2015  Glucose 65 - 99 mg/dL 121(H) 99 -  BUN 6 - 20 mg/dL 23(H) 20 -  Creatinine 0.44 - 1.00 mg/dL 0.68 0.89 1.00  Sodium 135 - 145 mmol/L 136 135 -  Potassium 3.5 - 5.1 mmol/L 3.6 3.7 -  Chloride 101 - 111 mmol/L 105 103 -  CO2 22 - 32 mmol/L 25 26 -  Calcium 8.9 - 10.3 mg/dL 9.4 9.4 -   Hepatic Function Latest Ref Rng 06/02/2015 04/28/2015 12/16/2014  Total Protein 6.5 - 8.1 g/dL 6.7 7.2 6.5  Albumin 3.5 - 5.0 g/dL 3.7 4.2 4.1  AST 15 - 41 U/L _0 ALT 14 - 54 U/L _1 Alk Phosphatase 38 - 126 U/L 93 91 92  Total Bilirubin 0.3 - 1.2 mg/dL 0.7 0.4 0.8   CBC Latest Ref Rng 06/02/2015 05/26/2015 05/19/2015  WBC 3.6 - 11.0 K/uL 15.8(H) 13.2(H) 17.1(H)  Hemoglobin 12.0 - 16.0 g/dL 11.6(L) 11.4(L) 12.7  Hematocrit 35.0 - 47.0 % 33.4(L) 33.9(L) 38.0  Platelets 150 - 440 K/uL 423 214 125(L)   Lab Results  Component Value Date   MCV 89.4 06/02/2015   MCV 89.4 05/26/2015   MCV 89.9 05/19/2015   Lab Results    Component Value Date   TSH 1.167 05/28/2014   Lab Results  Component Value Date   HGBA1C 5.7* 10/19/2012   Lipid Panel     Component Value Date/Time   CHOL 177 12/16/2014 1038   CHOL 141 04/16/2013 0924   TRIG 152* 12/16/2014 1038   TRIG 112 04/16/2013 0924   HDL 52 12/16/2014 1038   HDL 53 04/16/2013 0924   CHOLHDL 3.4 12/16/2014 1038   VLDL 30 12/16/2014 1038   LDLCALC 95 12/16/2014 1038   LDLCALC 66 04/16/2013 0924   RADIOLOGY: No results found.    ASSESSMENT AND PLAN: Susan [name] is a 81 year-old female who presented to [facility] on 10/19/2012 after an approximate 12 hour delay from back and chest discomfort. When she arrived to [facility] she had excellent door to balloon time at 27 minutes in the setting of an anterior wall ST segment elevation myocardial infarction. She underwent successful stenting of her LAD and because of diffuse disease tandem DES stents were inserted.  She underwent staged intervention to the circumflex  4 days later.. She did have concomitant 50 - 60% RCA narrowing and a large dominant right coronary artery.  She has a history of ACE inhibitor induced cough, but has tolerated ARB therapy with losartan .  A nuclear perfusion study  demonstrated significant salvage of myocardium in the majority of the LAD territory and showed distal apical scar.  She continues to be on dual antiplatelet therapy and now is on the reduced dose of Brilinta per [employer] trial data.  When I last saw her, was concerned about obstructive sleep apnea.  She was found to have severe obstructive sleep apnea which was significant.  He worse with ramp sleep.  She also had significant nocturnal  oxygen desaturation, which undoubtedly may be plate may have played a role in her cardiac arrhythmias.  She has now been started on CPAP therapy and notes marked improvement in her sleep quality.  She is using the mask that was given to her in the sleep lab, which was a fissure and pain  medium nasal mask.  She is meeting compliance standards.  There was some complication due to her concomitant chemotherapy.  Her blood pressure today is controlled on treatment. I reviewed recent blood work.  She's not having any anginal symptoms.  I will see her in 6 months for cardiology reevaluation. Time spent: 25 minutes  Troy Sine, MD, [facility]  06/03/2015 9:37 PM

## 2015-06-09 ENCOUNTER — Other Ambulatory Visit: Payer: Self-pay | Admitting: Cardiovascular Disease

## 2015-06-09 ENCOUNTER — Other Ambulatory Visit: Payer: Self-pay | Admitting: Oncology

## 2015-06-09 ENCOUNTER — Inpatient Hospital Stay: Payer: Medicare Other

## 2015-06-09 DIAGNOSIS — C541 Malignant neoplasm of endometrium: Secondary | ICD-10-CM

## 2015-06-09 LAB — CBC WITH DIFFERENTIAL/PLATELET
BASOS PCT: 1 %
Basophils Absolute: 0.1 10*3/uL (ref 0–0.1)
EOS ABS: 0.3 10*3/uL (ref 0–0.7)
EOS PCT: 2 %
HCT: 36.9 % (ref 35.0–47.0)
Hemoglobin: 12.5 g/dL (ref 12.0–16.0)
LYMPHS ABS: 1.9 10*3/uL (ref 1.0–3.6)
Lymphocytes Relative: 14 %
MCH: 30.5 pg (ref 26.0–34.0)
MCHC: 34 g/dL (ref 32.0–36.0)
MCV: 89.9 fL (ref 80.0–100.0)
MONO ABS: 1.5 10*3/uL — AB (ref 0.2–0.9)
MONOS PCT: 11 %
Neutro Abs: 9.6 10*3/uL — ABNORMAL HIGH (ref 1.4–6.5)
Neutrophils Relative %: 72 %
Platelets: 189 10*3/uL (ref 150–440)
RBC: 4.1 MIL/uL (ref 3.80–5.20)
RDW: 14.5 % (ref 11.5–14.5)
WBC: 13.3 10*3/uL — AB (ref 3.6–11.0)

## 2015-06-09 NOTE — Telephone Encounter (Signed)
REFILL 

## 2015-06-10 ENCOUNTER — Other Ambulatory Visit: Payer: Self-pay | Admitting: *Deleted

## 2015-06-10 ENCOUNTER — Telehealth: Payer: Self-pay | Admitting: *Deleted

## 2015-06-10 ENCOUNTER — Ambulatory Visit: Payer: Medicare Other

## 2015-06-10 DIAGNOSIS — R197 Diarrhea, unspecified: Secondary | ICD-10-CM

## 2015-06-10 DIAGNOSIS — R509 Fever, unspecified: Secondary | ICD-10-CM

## 2015-06-10 DIAGNOSIS — C541 Malignant neoplasm of endometrium: Secondary | ICD-10-CM | POA: Diagnosis not present

## 2015-06-10 NOTE — Telephone Encounter (Signed)
Agrees to give stool sample, but does not think she can do so before tomorrow and see NP tomorrow @ 10 am

## 2015-06-10 NOTE — Telephone Encounter (Signed)
Called patient regarding coming in to give a stool sample today and be seen by NP tomorrow.  Patient states she does not think she can give a stool sample today, however, is sending her husband to pick up container for her.  She has agreed to come in tomorrow 06-11-15 to see NP @ 10 am.

## 2015-06-10 NOTE — Telephone Encounter (Signed)
Called to report 3 days of fever and diarrhea awaking her at 2 AM uncontrollable and reports watery stools 3 - 4 times per day.  Temp 100 - 101 since Tuesday. Asking what she can take for it. She has not taken anything for either symptom

## 2015-06-11 ENCOUNTER — Inpatient Hospital Stay (HOSPITAL_BASED_OUTPATIENT_CLINIC_OR_DEPARTMENT_OTHER): Payer: Medicare Other | Admitting: Family Medicine

## 2015-06-11 ENCOUNTER — Inpatient Hospital Stay: Payer: Medicare Other

## 2015-06-11 VITALS — BP 103/69 | HR 79 | Temp 95.4°F | Resp 18 | Wt 201.1 lb

## 2015-06-11 DIAGNOSIS — Z95818 Presence of other cardiac implants and grafts: Secondary | ICD-10-CM

## 2015-06-11 DIAGNOSIS — C541 Malignant neoplasm of endometrium: Secondary | ICD-10-CM | POA: Diagnosis not present

## 2015-06-11 DIAGNOSIS — I2510 Atherosclerotic heart disease of native coronary artery without angina pectoris: Secondary | ICD-10-CM

## 2015-06-11 DIAGNOSIS — Z7982 Long term (current) use of aspirin: Secondary | ICD-10-CM

## 2015-06-11 DIAGNOSIS — R531 Weakness: Secondary | ICD-10-CM

## 2015-06-11 DIAGNOSIS — R197 Diarrhea, unspecified: Secondary | ICD-10-CM | POA: Diagnosis not present

## 2015-06-11 DIAGNOSIS — Z79899 Other long term (current) drug therapy: Secondary | ICD-10-CM

## 2015-06-11 DIAGNOSIS — A047 Enterocolitis due to Clostridium difficile: Secondary | ICD-10-CM | POA: Diagnosis not present

## 2015-06-11 DIAGNOSIS — R509 Fever, unspecified: Secondary | ICD-10-CM | POA: Diagnosis not present

## 2015-06-11 DIAGNOSIS — R5383 Other fatigue: Secondary | ICD-10-CM

## 2015-06-11 DIAGNOSIS — I252 Old myocardial infarction: Secondary | ICD-10-CM

## 2015-06-11 DIAGNOSIS — Z780 Asymptomatic menopausal state: Secondary | ICD-10-CM

## 2015-06-11 LAB — GASTROINTESTINAL PANEL BY PCR, STOOL (REPLACES STOOL CULTURE)

## 2015-06-11 LAB — C DIFFICILE QUICK SCREEN W PCR REFLEX
C Diff antigen: POSITIVE — AB
C Diff interpretation: POSITIVE
C Diff toxin: POSITIVE — AB

## 2015-06-11 MED ORDER — DEXAMETHASONE SODIUM PHOSPHATE 10 MG/ML IJ SOLN: 10.0000 mg | Freq: Once | INTRAMUSCULAR | Status: DC

## 2015-06-11 MED ORDER — SODIUM CHLORIDE 0.9% FLUSH
10.0000 mL | Freq: Once | INTRAVENOUS | Status: AC
  Administered 2015-06-11: 10 mL via INTRAVENOUS
  Filled 2015-06-11: qty 10

## 2015-06-11 MED ORDER — METRONIDAZOLE 500 MG PO TABS: 500.0000 mg | ORAL_TABLET | Freq: Three times a day (TID) | ORAL | Status: DC

## 2015-06-11 MED ORDER — HEPARIN SOD (PORK) LOCK FLUSH 100 UNIT/ML IV SOLN
500.0000 [IU] | Freq: Once | INTRAVENOUS | Status: AC
  Administered 2015-06-11: 500 [IU] via INTRAVENOUS
  Filled 2015-06-11: qty 5

## 2015-06-11 MED ORDER — SODIUM CHLORIDE 0.9 % IV SOLN
10.0000 mg | Freq: Once | INTRAVENOUS | Status: AC
  Administered 2015-06-11: 10 mg via INTRAVENOUS
  Filled 2015-06-11: qty 1

## 2015-06-11 MED ORDER — SODIUM CHLORIDE 0.9 % IV SOLN
Freq: Once | INTRAVENOUS | Status: AC
  Administered 2015-06-11: 11:00:00 via INTRAVENOUS
  Filled 2015-06-11: qty 1000

## 2015-06-11 NOTE — Progress Notes (Signed)
Patient here due to diarrhea and fever since Tuesday.  States fever broke last night. Patient had loose stool this morning, but has been taking imodium AD.  Also c/o having achiness in her knees, ankles and feet.  Patient brought stool samples this morning.

## 2015-06-11 NOTE — Progress Notes (Signed)
[place]  Telephone:(336) [phone]  Fax:(336) [phone]     Susan [name] DOB: [birth year]  MR#: [id number]  [id number]  Patient Care Team: [name], MD as PCP - General (Family Medicine)  CHIEF COMPLAINT:  Chief Complaint  Patient presents with  . Endometrial Cancer    INTERVAL HISTORY:  Patient is here as an acute add on regarding diarrhea, fever, and chills for the past 3 days. She reports having 4-6 loose stools every day. She awakens at night with diarrhea as well. She recently received chemotherapy for endometrial cancer on 06/02/2015 with Carbo and Taxol, with Neulasta support. She overall reports tolerating chemotherapy very well but diarrhea and fever started approximately 3 days ago and she is persistently fatigued and has not "recovered" from last treatment. Ferver has not been greater than 101, and today is 99.3.  REVIEW OF SYSTEMS:   Review of Systems  Constitutional: Positive for fever and chills. Negative for weight loss, malaise/fatigue and diaphoresis.  HENT: Negative.   Eyes: Negative.   Respiratory: Negative for cough, hemoptysis, sputum production, shortness of breath and wheezing.   Cardiovascular: Negative for chest pain, palpitations, orthopnea, claudication, leg swelling and PND.  Gastrointestinal: Positive for diarrhea. Negative for heartburn, nausea, vomiting, abdominal pain, constipation, blood in stool and melena.  Genitourinary: Negative.   Musculoskeletal: Negative.   Skin: Negative.   Neurological: Positive for weakness. Negative for dizziness, tingling, focal weakness and seizures.  Endo/Heme/Allergies: Does not bruise/bleed easily.  Psychiatric/Behavioral: Negative for depression. The patient is not nervous/anxious and does not have insomnia.     As per HPI. Otherwise, a complete review of systems is negatve.  ONCOLOGY HISTORY: Oncology History   Endometrial cancer stage IIIc      Carcinoma of endometrium ([place])   04/02/2015 Initial Diagnosis Carcinoma of endometrium [facility])    PAST MEDICAL HISTORY: Past Medical History  Diagnosis Date  . Hypertension   . Coronary artery disease   . Hyperlipidemia   . NSVT (nonsustained ventricular tachycardia) ([place])   . Bradycardia   . 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 Myocardial infarction ([place])   . Heart murmur     PAST SURGICAL HISTORY: Past Surgical History  Procedure Laterality Date  . Left heart catheterization with coronary angiogram  10/19/2012    Procedure: LEFT HEART CATHETERIZATION WITH CORONARY ANGIOGRAM;  Surgeon: Troy Sine, MD;  Location: [facility] CATH LAB;  Service: Cardiovascular;;  . Percutaneous coronary stent intervention (pci-s)  10/19/2012    Procedure: PERCUTANEOUS CORONARY STENT INTERVENTION (PCI-S);  Surgeon: Troy Sine, MD;  Location: [facility] CATH LAB;  Service: Cardiovascular;;  . Percutaneous coronary stent intervention (pci-s) N/A 10/23/2012    Procedure: PERCUTANEOUS CORONARY STENT INTERVENTION (PCI-S);  Surgeon: Troy Sine, MD;  Location: [facility] CATH LAB;  Service: Cardiovascular;  Laterality: N/A;  . Abdominal hysterectomy    . Peripheral vascular catheterization N/A 05/05/2015    Procedure: Glori Luis Cath Insertion;  Surgeon: Algernon Huxley, MD;  Location: [place] CV LAB;  Service: Cardiovascular;  Laterality: N/A;    FAMILY HISTORY Family History  Problem Relation Age of Onset  . Heart disease Mother   . Breast cancer Mother   . Heart disease Father   . Diabetes Father   . Diabetes Brother   . Stroke Brother     GYNECOLOGIC HISTORY:  No LMP recorded. Patient is postmenopausal.     ADVANCED DIRECTIVES:    HEALTH MAINTENANCE: Social History  Substance Use Topics  .  Smoking status: Never Smoker   . Smokeless tobacco: Never Used  . Alcohol Use: 0.6 oz/week    1 Glasses of wine per week     Allergies  Allergen Reactions  . Penicillins Rash    Current Outpatient Prescriptions  Medication Sig Dispense Refill  .  acetaminophen (TYLENOL) 500 MG tablet Take 500 mg by mouth every 6 (six) hours as needed.    [name] aspirin EC 81 MG tablet Take 81 mg by mouth daily.    [name] atorvastatin (LIPITOR) 80 MG tablet Take 1 tablet (80 mg total) by mouth daily at 6 PM. NEED OV. 30 tablet 0  . BRILINTA 60 MG TABS tablet TAKE 1 TABLET (60 MG TOTAL) BY MOUTH 2 (TWO) TIMES DAILY. 60 tablet 5  . ezetimibe (ZETIA) 10 MG tablet Take 1 tablet (10 mg total) by mouth daily. 30 tablet 11  . furosemide (LASIX) 20 MG tablet TAKE 1 TABLET BY MOUTH DAILY. 90 tablet 2  . gabapentin (NEURONTIN) 100 MG capsule Take 2 capsules (200 mg total) by mouth 2 (two) times daily. 120 capsule 0  . isosorbide mononitrate (IMDUR) 30 MG 24 hr tablet Take 1 tablet (30 mg total) by mouth daily. 30 tablet 11  . lidocaine-prilocaine (EMLA) cream Apply to affected area once 30 g 3  . loperamide (IMODIUM A-D) 2 MG tablet Take 2 mg by mouth 4 (four) times daily as needed for diarrhea or loose stools.    [name] losartan (COZAAR) 50 MG tablet Take 1 tablet (50 mg total) by mouth daily. 90 tablet 3  . Multiple Vitamin (MULTIVITAMIN WITH MINERALS) TABS Take 1 tablet by mouth daily.    . nitroGLYCERIN (NITROSTAT) 0.4 MG SL tablet Place 1 tablet (0.4 mg total) under the tongue every 5 (five) minutes x 3 doses as needed for chest pain. 25 tablet 3  . omeprazole (PRILOSEC) 20 MG capsule Take 20 mg by mouth daily.    . ondansetron (ZOFRAN) 8 MG tablet Take 1 tablet (8 mg total) by mouth 2 (two) times daily as needed for refractory nausea / vomiting. Start on day 3 after chemo. 30 tablet 1  . Vitamin D, Cholecalciferol, 1000 UNITS CAPS Take 1 capsule by mouth daily.    [name] dexamethasone (DECADRON) 4 MG tablet TAKE 1 TABLET BY MOUTH QD EXCEPT TWICE A DAY BEFORE EACH CHEMO 20 tablet 1  . metroNIDAZOLE (FLAGYL) 500 MG tablet Take 1 tablet (500 mg total) by mouth 3 (three) times daily. 42 tablet 0   No current facility-administered medications for this visit.    OBJECTIVE: BP 103/69  mmHg  Pulse 79  Temp(Src) 95.4 F (35.2 C) (Tympanic)  Resp 18  Wt 201 lb 2 oz (91.23 kg)   Body mass index is 35.06 kg/(m^2).    ECOG FS:1 - Symptomatic but completely ambulatory  General: Well-developed, well-nourished, no acute distress. Eyes: Pink conjunctiva, anicteric sclera. HEENT: Normocephalic, moist mucous membranes, clear oropharnyx. Lungs: Clear to auscultation bilaterally. Heart: Regular rate and rhythm. No rubs, murmurs, or gallops. Abdomen: Soft, nontender, nondistended. No organomegaly noted, normoactive bowel sounds. Musculoskeletal: No edema, cyanosis, or clubbing. Neuro: Alert, answering all questions appropriately. Cranial nerves grossly intact. Skin: No rashes or petechiae noted. Psych: Normal affect.   LAB RESULTS:  Infusion on 06/11/2015  Component Date Value Ref Range Status  . Campylobacter species 06/11/2015 NOT DETECTED  NOT DETECTED Final  . Plesimonas shigelloides 06/11/2015 NOT DETECTED  NOT DETECTED Final  . Salmonella species 06/11/2015 NOT DETECTED  NOT DETECTED Final  .  Yersinia enterocolitica 06/11/2015 NOT DETECTED  NOT DETECTED Final  . Vibrio species 06/11/2015 NOT DETECTED  NOT DETECTED Final  . Vibrio cholerae 06/11/2015 NOT DETECTED  NOT DETECTED Final  . Enteroaggregative E coli (EAEC) 06/11/2015 NOT DETECTED  NOT DETECTED Final  . Enteropathogenic E coli (EPEC) 06/11/2015 NOT DETECTED  NOT DETECTED Final  . Enterotoxigenic E coli (ETEC) 06/11/2015 NOT DETECTED  NOT DETECTED Final  . Shiga like toxin producing E coli * 06/11/2015 NOT DETECTED  NOT DETECTED Final  . E. coli O157 06/11/2015 NOT DETECTED  NOT DETECTED Final  . Shigella/Enteroinvasive E coli (EI* 06/11/2015 NOT DETECTED  NOT DETECTED Final  . Cryptosporidium 06/11/2015 NOT DETECTED  NOT DETECTED Final  . Cyclospora cayetanensis 06/11/2015 NOT DETECTED  NOT DETECTED Final  . Entamoeba histolytica 06/11/2015 NOT DETECTED  NOT DETECTED Final  . Giardia lamblia 06/11/2015 NOT  DETECTED  NOT DETECTED Final  . Adenovirus F40/41 06/11/2015 NOT DETECTED  NOT DETECTED Final  . Astrovirus 06/11/2015 NOT DETECTED  NOT DETECTED Final  . Norovirus GI/GII 06/11/2015 NOT DETECTED  NOT DETECTED Final  . Rotavirus A 06/11/2015 NOT DETECTED  NOT DETECTED Final  . Sapovirus (I, II, IV, and V) 06/11/2015 NOT DETECTED  NOT DETECTED Final  . C Diff antigen 06/10/2015 POSITIVE* NEGATIVE Final  . C Diff toxin 06/10/2015 POSITIVE* NEGATIVE Final  . C Diff interpretation 06/10/2015 Positive for toxigenic C. difficile, active toxin production present.   Final   Comment: CRITICAL RESULT CALLED TO, READ BACK BY AND VERIFIED WITH: Georgeanne Nim, NP at 1502 06/11/15 by JRS   Appointment on 06/09/2015  Component Date Value Ref Range Status  . WBC 06/09/2015 13.3* 3.6 - 11.0 K/uL Final  . RBC 06/09/2015 4.10  3.80 - 5.20 MIL/uL Final  . Hemoglobin 06/09/2015 12.5  12.0 - 16.0 g/dL Final  . HCT 06/09/2015 36.9  35.0 - 47.0 % Final  . MCV 06/09/2015 89.9  80.0 - 100.0 fL Final  . MCH 06/09/2015 30.5  26.0 - 34.0 pg Final  . MCHC 06/09/2015 34.0  32.0 - 36.0 g/dL Final  . RDW 06/09/2015 14.5  11.5 - 14.5 % Final  . Platelets 06/09/2015 189  150 - 440 K/uL Final  . Neutrophils Relative % 06/09/2015 72   Final  . Neutro Abs 06/09/2015 9.6* 1.4 - 6.5 K/uL Final  . Lymphocytes Relative 06/09/2015 14   Final  . Lymphs Abs 06/09/2015 1.9  1.0 - 3.6 K/uL Final  . Monocytes Relative 06/09/2015 11   Final  . Monocytes Absolute 06/09/2015 1.5* 0.2 - 0.9 K/uL Final  . Eosinophils Relative 06/09/2015 2   Final  . Eosinophils Absolute 06/09/2015 0.3  0 - 0.7 K/uL Final  . Basophils Relative 06/09/2015 1   Final  . Basophils Absolute 06/09/2015 0.1  0 - 0.1 K/uL Final    STUDIES: No results found.  ASSESSMENT:  Diarrhea with fever.  PLAN:   1. Diarrhea with fever. Positive stool sample for Clostridium Difficile. Patient was started on Metronidazole 500mg  every 8 hours for 14 days. Advised  patient on proper hand hygiene and use of soap and hot water versus hand sanitizers. Patient has been instructed that if she does not improve over the next 1 week she is to call us back as we may need to change her medications. She has follow up scheduled for March 27th with Dr. Oliva Bustard.  Patient expressed understanding and was in agreement with this plan. She also understands that She can call clinic at  any time with any questions, concerns, or complaints.   Dr. Grayland Ormond was available for consultation and review of plan of care for this patient.  Carcinoma of endometrium [facility])   Staging form: Corpus Uteri - Carcinoma, AJCC 7th Edition     Clinical: Stage IIIC1 (T1a, N1, M0) - Signed by Forest Gleason, MD on 04/30/2015   Evlyn Kanner, NP   06/11/2015 9:31 AM

## 2015-06-14 ENCOUNTER — Other Ambulatory Visit: Payer: Self-pay | Admitting: Oncology

## 2015-06-15 ENCOUNTER — Encounter: Payer: Self-pay | Admitting: Cardiovascular Disease

## 2015-06-16 ENCOUNTER — Inpatient Hospital Stay: Payer: Medicare Other

## 2015-06-16 DIAGNOSIS — C541 Malignant neoplasm of endometrium: Secondary | ICD-10-CM

## 2015-06-16 LAB — CBC WITH DIFFERENTIAL/PLATELET
BASOS PCT: 0 %
Basophils Absolute: 0.1 10*3/uL (ref 0–0.1)
EOS ABS: 0 10*3/uL (ref 0–0.7)
Eosinophils Relative: 0 %
HCT: 35.3 % (ref 35.0–47.0)
HEMOGLOBIN: 11.9 g/dL — AB (ref 12.0–16.0)
Lymphocytes Relative: 9 %
Lymphs Abs: 1.9 10*3/uL (ref 1.0–3.6)
MCH: 30.5 pg (ref 26.0–34.0)
MCHC: 33.7 g/dL (ref 32.0–36.0)
MCV: 90.4 fL (ref 80.0–100.0)
Monocytes Absolute: 0.9 10*3/uL (ref 0.2–0.9)
Monocytes Relative: 4 %
NEUTROS PCT: 87 %
Neutro Abs: 18.9 10*3/uL — ABNORMAL HIGH (ref 1.4–6.5)
Platelets: 233 10*3/uL (ref 150–440)
RBC: 3.9 MIL/uL (ref 3.80–5.20)
RDW: 14.9 % — ABNORMAL HIGH (ref 11.5–14.5)
WBC: 21.8 10*3/uL — AB (ref 3.6–11.0)

## 2015-06-17 ENCOUNTER — Telehealth: Payer: Self-pay | Admitting: *Deleted

## 2015-06-17 MED ORDER — GABAPENTIN 100 MG PO CAPS: 200.0000 mg | ORAL_CAPSULE | Freq: Two times a day (BID) | ORAL | Status: DC

## 2015-06-17 NOTE — Telephone Encounter (Signed)
Refill sent to [facility] for gabapentin

## 2015-06-18 LAB — CBC
HCT: 36.3 % (ref 36.0–46.0)
Hemoglobin: 12.2 g/dL (ref 12.0–15.0)
MCH: 29.8 pg (ref 26.0–34.0)
MCHC: 33.6 g/dL (ref 30.0–36.0)
MCV: 88.5 fL (ref 78.0–100.0)
MPV: 9 fL (ref 8.6–12.4)
Platelets: 266 10*3/uL (ref 150–400)
RBC: 4.1 MIL/uL (ref 3.87–5.11)
RDW: 14.7 % (ref 11.5–15.5)
WBC: 17.8 10*3/uL — AB (ref 4.0–10.5)

## 2015-06-21 ENCOUNTER — Encounter: Payer: Self-pay | Admitting: Oncology

## 2015-06-21 ENCOUNTER — Inpatient Hospital Stay (HOSPITAL_BASED_OUTPATIENT_CLINIC_OR_DEPARTMENT_OTHER): Payer: Medicare Other | Admitting: Oncology

## 2015-06-21 ENCOUNTER — Inpatient Hospital Stay: Payer: Medicare Other

## 2015-06-21 VITALS — BP 111/64 | HR 60 | Temp 96.5°F | Resp 18 | Wt 198.5 lb

## 2015-06-21 DIAGNOSIS — T451X5S Adverse effect of antineoplastic and immunosuppressive drugs, sequela: Secondary | ICD-10-CM | POA: Diagnosis not present

## 2015-06-21 DIAGNOSIS — I2510 Atherosclerotic heart disease of native coronary artery without angina pectoris: Secondary | ICD-10-CM

## 2015-06-21 DIAGNOSIS — Z79899 Other long term (current) drug therapy: Secondary | ICD-10-CM

## 2015-06-21 DIAGNOSIS — I10 Essential (primary) hypertension: Secondary | ICD-10-CM

## 2015-06-21 DIAGNOSIS — R6883 Chills (without fever): Secondary | ICD-10-CM

## 2015-06-21 DIAGNOSIS — R262 Difficulty in walking, not elsewhere classified: Secondary | ICD-10-CM | POA: Diagnosis not present

## 2015-06-21 DIAGNOSIS — C541 Malignant neoplasm of endometrium: Secondary | ICD-10-CM

## 2015-06-21 DIAGNOSIS — Z7982 Long term (current) use of aspirin: Secondary | ICD-10-CM

## 2015-06-21 DIAGNOSIS — R531 Weakness: Secondary | ICD-10-CM

## 2015-06-21 DIAGNOSIS — R5383 Other fatigue: Secondary | ICD-10-CM

## 2015-06-21 DIAGNOSIS — G620 Drug-induced polyneuropathy: Secondary | ICD-10-CM

## 2015-06-21 DIAGNOSIS — E785 Hyperlipidemia, unspecified: Secondary | ICD-10-CM

## 2015-06-21 LAB — COMPREHENSIVE METABOLIC PANEL
ALBUMIN: 3.9 g/dL (ref 3.5–5.0)
ALK PHOS: 93 U/L (ref 38–126)
ALT: 28 U/L (ref 14–54)
AST: 26 U/L (ref 15–41)
Anion gap: 6 (ref 5–15)
BILIRUBIN TOTAL: 0.7 mg/dL (ref 0.3–1.2)
BUN: 25 mg/dL — AB (ref 6–20)
CALCIUM: 9.4 mg/dL (ref 8.9–10.3)
CO2: 29 mmol/L (ref 22–32)
Chloride: 100 mmol/L — ABNORMAL LOW (ref 101–111)
Creatinine, Ser: 0.9 mg/dL (ref 0.44–1.00)
GFR calc Af Amer: >60 mL/min (ref >60)
GFR calc non Af Amer: 60 mL/min — ABNORMAL LOW (ref >60)
GLUCOSE: 99 mg/dL (ref 65–99)
Potassium: 3.9 mmol/L (ref 3.5–5.1)
SODIUM: 135 mmol/L (ref 135–145)
TOTAL PROTEIN: 6.5 g/dL (ref 6.5–8.1)

## 2015-06-21 LAB — CBC WITH DIFFERENTIAL/PLATELET
BASOS ABS: 0.1 10*3/uL (ref 0–0.1)
BASOS PCT: 1 %
EOS ABS: 0 10*3/uL (ref 0–0.7)
Eosinophils Relative: 0 %
HEMATOCRIT: 36.1 % (ref 35.0–47.0)
HEMOGLOBIN: 12.4 g/dL (ref 12.0–16.0)
Lymphocytes Relative: 8 %
Lymphs Abs: 1.5 10*3/uL (ref 1.0–3.6)
MCH: 31 pg (ref 26.0–34.0)
MCHC: 34.5 g/dL (ref 32.0–36.0)
MCV: 90 fL (ref 80.0–100.0)
Monocytes Absolute: 1.5 10*3/uL — ABNORMAL HIGH (ref 0.2–0.9)
Monocytes Relative: 8 %
NEUTROS ABS: 16.8 10*3/uL — AB (ref 1.4–6.5)
NEUTROS PCT: 83 %
Platelets: 263 10*3/uL (ref 150–440)
RBC: 4.01 MIL/uL (ref 3.80–5.20)
RDW: 14.9 % — ABNORMAL HIGH (ref 11.5–14.5)
WBC: 20 10*3/uL — AB (ref 3.6–11.0)

## 2015-06-21 LAB — MAGNESIUM: MAGNESIUM: 2 mg/dL (ref 1.7–2.4)

## 2015-06-21 NOTE — Progress Notes (Signed)
Patient states she cannot feel her feet.  Would like MD to go over labwork.

## 2015-06-21 NOTE — Progress Notes (Signed)
Susan [name] @ [name] Telephone:(336) [phone]  Fax:(336) [place]: 14-Oct-1934  MR#: [id number]  TMA#:[id number]  Patient Care Team: [name], MD as PCP - General (Family Medicine)  CHIEF COMPLAINT:  Chief Complaint  Patient presents with  . Endometrial Cancer   endometrial cancer stage IIIc 1  VISIT DIAGNOSIS:   No diagnosis found.   Oncology History   Endometrial cancer stage IIIc      Carcinoma of endometrium ([place])   04/02/2015 Initial Diagnosis Carcinoma of endometrium ([place])      INTERVAL HISTORY: After Thanksgiving, for at least 7-10 days, she had noted dried blood on her pad. She did a U/A and took Cipro for 3 days but culture was negative for UTI. 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 She is still having the spotting. No itching or vaginal discharge. Denies pelvic pain.   Patient came today to initiate third cycle of chemotherapy. Patient continues to feel numbness in both lower extremity resulting into difficulty in ambulation requiring cane to walk.  Chills no fever.  No nausea or vomiting. Patient has not lost any power in the lower extremity.  But still walking with the help of  CANE  REVIEW OF SYSTEMS:    general status: Patient is feeling weak and tired.  No change in a performance status.  No chills.  No fever.  HEENT: No headache or dizziness. Lungs: No cough or shortness of breath Cardiac: No chest pain or paroxysmal nocturnal dyspnea A senna has a history of coronary artery disease and stent placement GI: No nausea no vomiting no diarrhea no abdominal pain Skin: No rash Lower extremity no swelling Neurological system: As described about patient had burning tingling in both lower extremity and minimally in upper extremity after last chemotherapy was given Neurontin which has helped.  Patient is taking twice a day which will be increased to 3 times a  day Difficulty in ambulation.  Patient does not feel the ground walking with the help of a cane Musculoskeletal system no bony pains GU: Patient had a recent surgery is some abdominal discomfort. Nausea vomiting diarrhea or rectal bleeding  As per HPI. Otherwise, a complete review of systems is negatve.  PAST MEDICAL HISTORY: Past Medical History  Diagnosis Date  . Hypertension   . Coronary artery disease   . Hyperlipidemia   . NSVT (nonsustained ventricular tachycardia) ([place])   . Bradycardia   . 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 Myocardial infarction ([place])   . Heart murmur     PAST SURGICAL HISTORY: Past Surgical History  Procedure Laterality Date  . Left heart catheterization with coronary angiogram  10/19/2012    Procedure: LEFT HEART CATHETERIZATION WITH CORONARY ANGIOGRAM;  Surgeon: Troy Sine, MD;  Location: [facility] CATH LAB;  Service: Cardiovascular;;  . Percutaneous coronary stent intervention (pci-s)  10/19/2012    Procedure: PERCUTANEOUS CORONARY STENT INTERVENTION (PCI-S);  Surgeon: Troy Sine, MD;  Location: [facility] CATH LAB;  Service: Cardiovascular;;  . Percutaneous coronary stent intervention (pci-s) N/A 10/23/2012    Procedure: PERCUTANEOUS CORONARY STENT INTERVENTION (PCI-S);  Surgeon: Troy Sine, MD;  Location: [facility] CATH LAB;  Service: Cardiovascular;  Laterality: N/A;  . Abdominal hysterectomy    . Peripheral vascular catheterization N/A 05/05/2015    Procedure: Glori Luis Cath Insertion;  Surgeon: Algernon Huxley, MD;  Location: [place] CV LAB;  Service: Cardiovascular;  Laterality: N/A;  FAMILY HISTORY Family History  Problem Relation Age of Onset  . Heart disease Mother   . Breast cancer Mother   . Heart disease Father   . Diabetes Father   . Diabetes Brother   . Stroke Brother         ADVANCED DIRECTIVES:  Patient does not have any living will or healthcare power of attorney.  Information was given .  Available resources had been discussed.  We  will follow-up on subsequent appointments regarding this issue  HEALTH MAINTENANCE: Social History  Substance Use Topics  . Smoking status: Never Smoker   . Smokeless tobacco: Never Used  . Alcohol Use: 0.6 oz/week    1 Glasses of wine per week      Allergies  Allergen Reactions  . Penicillins Rash    Current Outpatient Prescriptions  Medication Sig Dispense Refill  . acetaminophen (TYLENOL) 500 MG tablet Take 500 mg by mouth every 6 (six) hours as needed.    [name] aspirin EC 81 MG tablet Take 81 mg by mouth daily.    [name] atorvastatin (LIPITOR) 80 MG tablet Take 1 tablet (80 mg total) by mouth daily at 6 PM. NEED OV. 30 tablet 0  . BRILINTA 60 MG TABS tablet TAKE 1 TABLET (60 MG TOTAL) BY MOUTH 2 (TWO) TIMES DAILY. 60 tablet 5  . dexamethasone (DECADRON) 4 MG tablet TAKE 1 TABLET BY MOUTH QD EXCEPT TWICE A DAY BEFORE EACH CHEMO 20 tablet 1  . ezetimibe (ZETIA) 10 MG tablet Take 1 tablet (10 mg total) by mouth daily. 30 tablet 11  . furosemide (LASIX) 20 MG tablet TAKE 1 TABLET BY MOUTH DAILY. 90 tablet 2  . gabapentin (NEURONTIN) 100 MG capsule Take 2 capsules (200 mg total) by mouth 2 (two) times daily. 120 capsule 3  . isosorbide mononitrate (IMDUR) 30 MG 24 hr tablet Take 1 tablet (30 mg total) by mouth daily. 30 tablet 11  . lidocaine-prilocaine (EMLA) cream Apply to affected area once 30 g 3  . loperamide (IMODIUM A-D) 2 MG tablet Take 2 mg by mouth 4 (four) times daily as needed for diarrhea or loose stools.    [name] losartan (COZAAR) 50 MG tablet Take 1 tablet (50 mg total) by mouth daily. 90 tablet 3  . metroNIDAZOLE (FLAGYL) 500 MG tablet Take 1 tablet (500 mg total) by mouth 3 (three) times daily. 42 tablet 0  . Multiple Vitamin (MULTIVITAMIN WITH MINERALS) TABS Take 1 tablet by mouth daily.    . nitroGLYCERIN (NITROSTAT) 0.4 MG SL tablet Place 1 tablet (0.4 mg total) under the tongue every 5 (five) minutes x 3 doses as needed for chest pain. 25 tablet 3  . omeprazole (PRILOSEC) 20  MG capsule Take 20 mg by mouth daily.    . ondansetron (ZOFRAN) 8 MG tablet Take 1 tablet (8 mg total) by mouth 2 (two) times daily as needed for refractory nausea / vomiting. Start on day 3 after chemo. 30 tablet 1  . Vitamin D, Cholecalciferol, 1000 UNITS CAPS Take 1 capsule by mouth daily.     No current facility-administered medications for this visit.    OBJECTIVE: PHYSICAL EXAM: Gen. status: Patient is alert oriented in mild distress because of postoperative pain Head exam was generally normal. There was no scleral icterus or corneal arcus. Mucous membranes were moist. Examination of the chest was unremarkable. There were no bony deformities, no asymmetry, and no other abnormalities. Cardiac: Systolic murmur.  Regular heart sounds. Abdomen: Soft.  Tenderness present.  Liver and spleen not palpable.  No masses palpable. Neurologically, Patient has burning and tingling in both lower extremity Grade 2 neuropathy without any motor loss Started on Neurontin to be increased to 3 times a week Examination of the skin revealed no evidence of significant rashes, suspicious appearing nevi or other concerning lesions. Lower extremity no edema Musculoskeletal system within normal limit  Filed Vitals:   06/21/15 1024  BP: 111/64  Pulse: 60  Temp: 96.5 F (35.8 C)  Resp: 18     Body mass index is 34.61 kg/(m^2).    ECOG FS:1 - Symptomatic but completely ambulatory  LAB RESULTS:  Appointment on 06/21/2015  Component Date Value Ref Range Status  . WBC 06/21/2015 20.0* 3.6 - 11.0 K/uL Final  . RBC 06/21/2015 4.01  3.80 - 5.20 MIL/uL Final  . Hemoglobin 06/21/2015 12.4  12.0 - 16.0 g/dL Final  . HCT 06/21/2015 36.1  35.0 - 47.0 % Final  . MCV 06/21/2015 90.0  80.0 - 100.0 fL Final  . MCH 06/21/2015 31.0  26.0 - 34.0 pg Final  . MCHC 06/21/2015 34.5  32.0 - 36.0 g/dL Final  . RDW 06/21/2015 14.9* 11.5 - 14.5 % Final  . Platelets 06/21/2015 263  150 - 440 K/uL Final  . Neutrophils  Relative % 06/21/2015 83   Final  . Neutro Abs 06/21/2015 16.8* 1.4 - 6.5 K/uL Final  . Lymphocytes Relative 06/21/2015 8   Final  . Lymphs Abs 06/21/2015 1.5  1.0 - 3.6 K/uL Final  . Monocytes Relative 06/21/2015 8   Final  . Monocytes Absolute 06/21/2015 1.5* 0.2 - 0.9 K/uL Final  . Eosinophils Relative 06/21/2015 0   Final  . Eosinophils Absolute 06/21/2015 0.0  0 - 0.7 K/uL Final  . Basophils Relative 06/21/2015 1   Final  . Basophils Absolute 06/21/2015 0.1  0 - 0.1 K/uL Final  . Sodium 06/21/2015 135  135 - 145 mmol/L Final  . Potassium 06/21/2015 3.9  3.5 - 5.1 mmol/L Final  . Chloride 06/21/2015 100* 101 - 111 mmol/L Final  . CO2 06/21/2015 29  22 - 32 mmol/L Final  . Glucose, Bld 06/21/2015 99  65 - 99 mg/dL Final  . BUN 06/21/2015 25* 6 - 20 mg/dL Final  . Creatinine, Ser 06/21/2015 0.90  0.44 - 1.00 mg/dL Final  . Calcium 06/21/2015 9.4  8.9 - 10.3 mg/dL Final  . Total Protein 06/21/2015 6.5  6.5 - 8.1 g/dL Final  . Albumin 06/21/2015 3.9  3.5 - 5.0 g/dL Final  . AST 06/21/2015 26  15 - 41 U/L Final  . ALT 06/21/2015 28  14 - 54 U/L Final  . Alkaline Phosphatase 06/21/2015 93  38 - 126 U/L Final  . Total Bilirubin 06/21/2015 0.7  0.3 - 1.2 mg/dL Final  . GFR calc non Af Amer 06/21/2015 60* >60 mL/min Final  . GFR calc Af Amer 06/21/2015 >60  >60 mL/min Final   Comment: (NOTE) The eGFR has been calculated using the CKD EPI equation. This calculation has not been validated in all clinical situations. eGFR's persistently <60 mL/min signify possible Chronic Kidney Disease.   . Anion gap 06/21/2015 6  5 - 15 Final  . Magnesium 06/21/2015 2.0  1.7 - 2.4 mg/dL Final     STUDIES: Pathology report as well as previous CT scan has been reviewed independently ASSESSMENT:  Endometrial carcinoma stage III C1 Lymphovascular invasion Endometrioid and serous carcinoma Regional lymph nodes are positive Aortic lymph nodes are negative washings were  negative  Patient has a grade  2 neuropathy Because of that.  We will hold off chemotherapy break for a few weeks to see whether there is any improvement in patient's neuropathy. Then patient will be given third cycle of chemotherapy with reduced dose of Taxol If neuropathy does not improve then patient will proceed with radiation therapy DiscussED  that with the patient. Total duration of visit was  25    minutes.  50% or more time was spent in counseling patient and family regarding prognosis and options of treatment and available resources 8 to hold off chemotherapy to improve neuropathy. Patient expressed understanding and was in agreement with this plan. She also understands that She can call clinic at any time with any questions, concerns, or complaints.    Carcinoma of endometrium [facility])   Staging form: Corpus Uteri - Carcinoma, AJCC 7th Edition     Clinical: Stage IIIC1 (T1a, N1, M0) - Signed by Forest Gleason, MD on 04/30/2015   Forest Gleason, MD   06/21/2015 11:18 AM

## 2015-06-23 ENCOUNTER — Inpatient Hospital Stay: Payer: Medicare Other

## 2015-06-23 ENCOUNTER — Ambulatory Visit: Payer: Medicare Other | Admitting: Obstetrics and Gynecology

## 2015-06-23 ENCOUNTER — Ambulatory Visit: Payer: Medicare Other

## 2015-06-23 LAB — COMPREHENSIVE METABOLIC PANEL
ALBUMIN: 3.6 g/dL (ref 3.6–5.1)
ALT: 31 U/L — ABNORMAL HIGH (ref 6–29)
AST: 23 U/L (ref 10–35)
Alkaline Phosphatase: 98 U/L (ref 33–130)
BILIRUBIN TOTAL: 0.6 mg/dL (ref 0.2–1.2)
BUN: 28 mg/dL — ABNORMAL HIGH (ref 7–25)
CHLORIDE: 100 mmol/L (ref 98–110)
CO2: 28 mmol/L (ref 20–31)
CREATININE: 1 mg/dL — AB (ref 0.60–0.93)
Calcium: 9.9 mg/dL (ref 8.6–10.4)
GLUCOSE: 86 mg/dL (ref 65–99)
Potassium: 4.1 mmol/L (ref 3.5–5.3)
SODIUM: 139 mmol/L (ref 135–146)
Total Protein: 6.2 g/dL (ref 6.1–8.1)

## 2015-06-23 LAB — LIPID PANEL
Cholesterol: 129 mg/dL (ref 125–200)
HDL: 61 mg/dL (ref >=46)
LDL CALC: 43 mg/dL (ref <130)
TRIGLYCERIDES: 125 mg/dL (ref <150)
Total CHOL/HDL Ratio: 2.1 Ratio (ref <=5.0)
VLDL: 25 mg/dL (ref <30)

## 2015-06-25 ENCOUNTER — Telehealth: Payer: Self-pay | Admitting: *Deleted

## 2015-06-25 NOTE — Telephone Encounter (Signed)
Did not get cemo this week due to affect on her feet. Called to report that her temp is 100.6 and asking what she is to do about it

## 2015-06-25 NOTE — Telephone Encounter (Signed)
Per L Herring, AGNP-C take tylenol and if continues to go up, see PCP for flu swab

## 2015-07-02 ENCOUNTER — Telehealth: Payer: Self-pay

## 2015-07-02 NOTE — Telephone Encounter (Signed)
  Oncology Nurse Navigator Documentation  Navigator Location: [facility] (07/02/15 1400) Navigator Encounter Type: Telephone (07/02/15 1400) Telephone: Symptom Mgt (07/02/15 1400)                                        Time Spent with Patient: 15 (07/02/15 1400)   Received call from Susan [name] at Dr Reuel Boom office. She stated that Susan [name] came in four days ago with low grade temp in the 98's. Patients normal is 96's. Reported she has no other symptoms except the fever. Has not received any chemo recently. Dr Grayland Ormond notified and no other actions need to take place at present.

## 2015-07-06 ENCOUNTER — Other Ambulatory Visit: Payer: Self-pay | Admitting: Cardiovascular Disease

## 2015-07-06 NOTE — Telephone Encounter (Signed)
REFILL 

## 2015-07-14 ENCOUNTER — Inpatient Hospital Stay (HOSPITAL_BASED_OUTPATIENT_CLINIC_OR_DEPARTMENT_OTHER): Payer: Medicare Other | Admitting: Oncology

## 2015-07-14 ENCOUNTER — Inpatient Hospital Stay: Payer: Medicare Other | Attending: Oncology

## 2015-07-14 ENCOUNTER — Inpatient Hospital Stay: Payer: Medicare Other

## 2015-07-14 VITALS — BP 155/83 | HR 58 | Temp 96.1°F | Resp 18 | Wt 202.0 lb

## 2015-07-14 DIAGNOSIS — R001 Bradycardia, unspecified: Secondary | ICD-10-CM | POA: Diagnosis not present

## 2015-07-14 DIAGNOSIS — C541 Malignant neoplasm of endometrium: Secondary | ICD-10-CM

## 2015-07-14 DIAGNOSIS — Z79899 Other long term (current) drug therapy: Secondary | ICD-10-CM | POA: Diagnosis not present

## 2015-07-14 DIAGNOSIS — R32 Unspecified urinary incontinence: Secondary | ICD-10-CM | POA: Insufficient documentation

## 2015-07-14 DIAGNOSIS — Z90722 Acquired absence of ovaries, bilateral: Secondary | ICD-10-CM

## 2015-07-14 DIAGNOSIS — I255 Ischemic cardiomyopathy: Secondary | ICD-10-CM | POA: Diagnosis not present

## 2015-07-14 DIAGNOSIS — I2510 Atherosclerotic heart disease of native coronary artery without angina pectoris: Secondary | ICD-10-CM | POA: Diagnosis not present

## 2015-07-14 DIAGNOSIS — R270 Ataxia, unspecified: Secondary | ICD-10-CM | POA: Diagnosis not present

## 2015-07-14 DIAGNOSIS — T451X5S Adverse effect of antineoplastic and immunosuppressive drugs, sequela: Secondary | ICD-10-CM | POA: Diagnosis not present

## 2015-07-14 DIAGNOSIS — G63 Polyneuropathy in diseases classified elsewhere: Secondary | ICD-10-CM

## 2015-07-14 DIAGNOSIS — R531 Weakness: Secondary | ICD-10-CM | POA: Insufficient documentation

## 2015-07-14 DIAGNOSIS — Z95818 Presence of other cardiac implants and grafts: Secondary | ICD-10-CM | POA: Insufficient documentation

## 2015-07-14 DIAGNOSIS — R58 Hemorrhage, not elsewhere classified: Secondary | ICD-10-CM | POA: Insufficient documentation

## 2015-07-14 DIAGNOSIS — C801 Malignant (primary) neoplasm, unspecified: Secondary | ICD-10-CM

## 2015-07-14 DIAGNOSIS — I10 Essential (primary) hypertension: Secondary | ICD-10-CM | POA: Insufficient documentation

## 2015-07-14 DIAGNOSIS — N8110 Cystocele, unspecified: Secondary | ICD-10-CM | POA: Diagnosis not present

## 2015-07-14 DIAGNOSIS — R5383 Other fatigue: Secondary | ICD-10-CM

## 2015-07-14 DIAGNOSIS — Z90710 Acquired absence of both cervix and uterus: Secondary | ICD-10-CM | POA: Insufficient documentation

## 2015-07-14 DIAGNOSIS — E785 Hyperlipidemia, unspecified: Secondary | ICD-10-CM | POA: Insufficient documentation

## 2015-07-14 DIAGNOSIS — I252 Old myocardial infarction: Secondary | ICD-10-CM | POA: Diagnosis not present

## 2015-07-14 DIAGNOSIS — G620 Drug-induced polyneuropathy: Secondary | ICD-10-CM

## 2015-07-14 DIAGNOSIS — R262 Difficulty in walking, not elsewhere classified: Secondary | ICD-10-CM

## 2015-07-14 DIAGNOSIS — Z7982 Long term (current) use of aspirin: Secondary | ICD-10-CM | POA: Diagnosis not present

## 2015-07-14 LAB — COMPREHENSIVE METABOLIC PANEL
ALK PHOS: 98 U/L (ref 38–126)
ALT: 30 U/L (ref 14–54)
AST: 32 U/L (ref 15–41)
Albumin: 3.9 g/dL (ref 3.5–5.0)
Anion gap: 6 (ref 5–15)
BUN: 22 mg/dL — AB (ref 6–20)
CALCIUM: 9.5 mg/dL (ref 8.9–10.3)
CO2: 26 mmol/L (ref 22–32)
CREATININE: 0.83 mg/dL (ref 0.44–1.00)
Chloride: 104 mmol/L (ref 101–111)
Glucose, Bld: 122 mg/dL — ABNORMAL HIGH (ref 65–99)
Potassium: 3.6 mmol/L (ref 3.5–5.1)
Sodium: 136 mmol/L (ref 135–145)
Total Bilirubin: 0.7 mg/dL (ref 0.3–1.2)
Total Protein: 6.8 g/dL (ref 6.5–8.1)

## 2015-07-14 LAB — CBC WITH DIFFERENTIAL/PLATELET
BASOS ABS: 0 10*3/uL (ref 0–0.1)
Basophils Relative: 0 %
Eosinophils Absolute: 0 10*3/uL (ref 0–0.7)
Eosinophils Relative: 0 %
HCT: 36.1 % (ref 35.0–47.0)
HEMOGLOBIN: 12.5 g/dL (ref 12.0–16.0)
LYMPHS ABS: 1.1 10*3/uL (ref 1.0–3.6)
LYMPHS PCT: 8 %
MCH: 31.7 pg (ref 26.0–34.0)
MCHC: 34.7 g/dL (ref 32.0–36.0)
MCV: 91.3 fL (ref 80.0–100.0)
Monocytes Absolute: 0.6 10*3/uL (ref 0.2–0.9)
Monocytes Relative: 4 %
NEUTROS ABS: 11.9 10*3/uL — AB (ref 1.4–6.5)
NEUTROS PCT: 88 %
Platelets: 409 10*3/uL (ref 150–440)
RBC: 3.95 MIL/uL (ref 3.80–5.20)
RDW: 17.1 % — ABNORMAL HIGH (ref 11.5–14.5)
WBC: 13.5 10*3/uL — AB (ref 3.6–11.0)

## 2015-07-14 LAB — MAGNESIUM: MAGNESIUM: 2.2 mg/dL (ref 1.7–2.4)

## 2015-07-14 MED ORDER — HEPARIN SOD (PORK) LOCK FLUSH 100 UNIT/ML IV SOLN
500.0000 [IU] | Freq: Once | INTRAVENOUS | Status: AC
  Administered 2015-07-14: 500 [IU] via INTRAVENOUS
  Filled 2015-07-14: qty 5

## 2015-07-14 MED ORDER — SODIUM CHLORIDE 0.9% FLUSH
10.0000 mL | Freq: Once | INTRAVENOUS | Status: AC
  Administered 2015-07-14: 10 mL via INTRAVENOUS
  Filled 2015-07-14: qty 10

## 2015-07-14 NOTE — Progress Notes (Signed)
Patient states she continues to have neuropathy in her feet and legs.  Does not feel Gabapentin is helping.  Patient states she is having urinary  Incontinence, wants to discuss with MD.

## 2015-07-15 LAB — CA 125: CA 125: 18.2 U/mL (ref 0.0–38.1)

## 2015-07-18 ENCOUNTER — Encounter: Payer: Self-pay | Admitting: Oncology

## 2015-07-18 NOTE — Progress Notes (Signed)
[place] @ [facility] [phone]  Fax:([phone]) [place]: 09-09-1934  MR#: [id number]  QMV#:[id number]  Patient Care Team: [name], MD as PCP - General (Family Medicine)  CHIEF COMPLAINT:  Chief Complaint  Patient presents with  . Endometrial Cancer   endometrial cancer stage IIIc 1  VISIT DIAGNOSIS:   No diagnosis found.   Oncology History   Endometrial cancer stage IIIc      Carcinoma of endometrium ([place])   04/02/2015 Initial Diagnosis Carcinoma of endometrium ([place])      INTERVAL HISTORY: After Thanksgiving, for at least 7-10 days, she had noted dried blood on her pad. She did a U/A and took Cipro for 3 days but culture was negative for UTI. 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 She is still having the spotting. No itching or vaginal discharge. Denies pelvic pain.   Patient is on ticagrelor (Brilinta) for her h/o stent placement after MI in July 2014. She is very functional and teaches water aerobics. No chest pain or other cardiac symptoms.  81 year old lady was evaluated by GYN oncology is had a surgery done with   Hysterectomy ,bilateral oophorectomy and lymphadenectomy in January of 2017  Patient had received 2 cycles of chemotherapy with carboplatinum and Taxol.  Then developed significant neuropathy.  Recently he was given a break from the chemotherapy with absolutely no improvement in neuropathy.  Has difficulty maintaining balance started walking with the help of walker.  Erick Blinks recently and had ecchymoses and bruises in the back Neurontin is being increased.  Numbness and pain in lower extremity persist.  Also having increasing problems with urinary incontinence and prolapse bladder here for further follow-up and treatment consideration  REVIEW OF SYSTEMS:    general status: Patient is feeling weak and tired.  No change in a performance status.  No chills.   No fever.  HEENT: No headache or dizziness. Lungs: No cough or shortness of breath Cardiac: No chest pain or paroxysmal nocturnal dyspnea A senna has a history of coronary artery disease and stent placement GI: No nausea no vomiting no diarrhea no abdominal pain Skin: Ecchymosis on the back (had a fall) Lower extremity no swelling Neurological system: Significant numbness and pain in lower extremity.  Difficulty maintaining balance.  Difficulty in ambulation. Musculoskeletal system no bony pains GU: Patient had a recent surgery is some abdominal discomfort. Nausea vomiting diarrhea or rectal bleeding  As per HPI. Otherwise, a complete review of systems is negatve.  PAST MEDICAL HISTORY: Past Medical History  Diagnosis Date  . Hypertension   . Coronary artery disease   . Hyperlipidemia   . NSVT (nonsustained ventricular tachycardia) ([place])   . Bradycardia   . 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 Myocardial infarction ([place])   . Heart murmur     PAST SURGICAL HISTORY: Past Surgical History  Procedure Laterality Date  . Left heart catheterization with coronary angiogram  10/19/2012    Procedure: LEFT HEART CATHETERIZATION WITH CORONARY ANGIOGRAM;  Surgeon: Troy Sine, MD;  Location: [facility] CATH LAB;  Service: Cardiovascular;;  . Percutaneous coronary stent intervention (pci-s)  10/19/2012    Procedure: PERCUTANEOUS CORONARY STENT INTERVENTION (PCI-S);  Surgeon: Troy Sine, MD;  Location: [facility] CATH LAB;  Service: Cardiovascular;;  . Percutaneous coronary stent intervention (pci-s) N/A 10/23/2012    Procedure: PERCUTANEOUS CORONARY STENT INTERVENTION (PCI-S);  Surgeon: Troy Sine, MD;  Location: [facility]  CATH LAB;  Service: Cardiovascular;  Laterality: N/A;  . Abdominal hysterectomy    . Peripheral vascular catheterization N/A 05/05/2015    Procedure: Glori Luis Cath Insertion;  Surgeon: Algernon Huxley, MD;  Location: [facility] CV LAB;  Service: Cardiovascular;  Laterality: N/A;    FAMILY  HISTORY Family History  Problem Relation Age of Onset  . Heart disease Mother   . Breast cancer Mother   . Heart disease Father   . Diabetes Father   . Diabetes Brother   . Stroke Brother     GYNECOLOGIC HISTORY:  No LMP recorded. Patient is postmenopausal.     ADVANCED DIRECTIVES:  Patient does not have any living will or healthcare power of attorney.  Information was given .  Available resources had been discussed.  We will follow-up on subsequent appointments regarding this issue  HEALTH MAINTENANCE: Social History  Substance Use Topics  . Smoking status: Never Smoker   . Smokeless tobacco: Never Used  . Alcohol Use: 0.6 oz/week    1 Glasses of wine per week      Allergies  Allergen Reactions  . Penicillins Rash    Current Outpatient Prescriptions  Medication Sig Dispense Refill  . acetaminophen (TYLENOL) 500 MG tablet Take 500 mg by mouth every 6 (six) hours as needed.    [name] aspirin EC 81 MG tablet Take 81 mg by mouth daily.    [name] atorvastatin (LIPITOR) 80 MG tablet Take 1 tablet (80 mg total) by mouth daily at 6 PM. NEED OV. 30 tablet 0  . BRILINTA 60 MG TABS tablet TAKE 1 TABLET (60 MG TOTAL) BY MOUTH 2 (TWO) TIMES DAILY. 60 tablet 5  . dexamethasone (DECADRON) 4 MG tablet TAKE 1 TABLET BY MOUTH QD EXCEPT TWICE A DAY BEFORE EACH CHEMO 20 tablet 1  . ezetimibe (ZETIA) 10 MG tablet Take 1 tablet (10 mg total) by mouth daily. 30 tablet 11  . furosemide (LASIX) 20 MG tablet TAKE 1 TABLET BY MOUTH DAILY. 90 tablet 2  . gabapentin (NEURONTIN) 100 MG capsule TAKE 2 CAPSULES (200 MG TOTAL) BY MOUTH 2 (TWO) TIMES DAILY. 120 capsule 0  . gabapentin (NEURONTIN) 100 MG capsule Take 2 capsules (200 mg total) by mouth 2 (two) times daily. 120 capsule 3  . isosorbide mononitrate (IMDUR) 30 MG 24 hr tablet Take 1 tablet (30 mg total) by mouth daily. 30 tablet 11  . lidocaine-prilocaine (EMLA) cream Apply to affected area once 30 g 3  . loperamide (IMODIUM A-D) 2 MG tablet Take 2 mg  by mouth 4 (four) times daily as needed for diarrhea or loose stools.    [name] losartan (COZAAR) 50 MG tablet Take 1 tablet (50 mg total) by mouth daily. 90 tablet 3  . metroNIDAZOLE (FLAGYL) 500 MG tablet Take 1 tablet (500 mg total) by mouth 3 (three) times daily. 42 tablet 0  . Multiple Vitamin (MULTIVITAMIN WITH MINERALS) TABS Take 1 tablet by mouth daily.    . nitroGLYCERIN (NITROSTAT) 0.4 MG SL tablet Place 1 tablet (0.4 mg total) under the tongue every 5 (five) minutes x 3 doses as needed for chest pain. 25 tablet 3  . omeprazole (PRILOSEC) 20 MG capsule Take 20 mg by mouth daily.    . ondansetron (ZOFRAN) 8 MG tablet Take 1 tablet (8 mg total) by mouth 2 (two) times daily as needed for refractory nausea / vomiting. Start on day 3 after chemo. 30 tablet 1  . Vitamin D, Cholecalciferol, 1000 UNITS CAPS Take 1  capsule by mouth daily.    [name] oxyCODONE (OXY IR/ROXICODONE) 5 MG immediate release tablet Take 5 mg by mouth every 4 (four) hours as needed. for pain  0   No current facility-administered medications for this visit.    OBJECTIVE: PHYSICAL EXAM: Gen. status: Patient is alert oriented in mild distress because of postoperative pain Head exam was generally normal. There was no scleral icterus or corneal arcus. Mucous membranes were moist. Examination of the chest was unremarkable. There were no bony deformities, no asymmetry, and no other abnormalities. Cardiac: Systolic murmur.  Regular heart sounds. Abdomen: Soft.  Tenderness present.  Liver and spleen not palpable.  No masses palpable. Neurologically, the patient was awake, alert, and oriented to person, place and time. Recently has significant sensory and motor impairment with peripheral neuropathy.  Sensory ataxia Examination of the skin revealed no evidence of significant rashes, suspicious appearing nevi or other concerning lesions. Lower extremity no edema Musculoskeletal system within normal limit  Skin: Ecchymosis on the left side  of mid back Filed Vitals:   07/14/15 0952  BP: 155/83  Pulse: 58  Temp: 96.1 F (35.6 C)  Resp: 18     Body mass index is 35.22 kg/(m^2).    ECOG FS:1 - Symptomatic but completely ambulatory  LAB RESULTS:  Appointment on 07/14/2015  Component Date Value Ref Range Status  . WBC 07/14/2015 13.5* 3.6 - 11.0 K/uL Final  . RBC 07/14/2015 3.95  3.80 - 5.20 MIL/uL Final  . Hemoglobin 07/14/2015 12.5  12.0 - 16.0 g/dL Final  . HCT 07/14/2015 36.1  35.0 - 47.0 % Final  . MCV 07/14/2015 91.3  80.0 - 100.0 fL Final  . MCH 07/14/2015 31.7  26.0 - 34.0 pg Final  . MCHC 07/14/2015 34.7  32.0 - 36.0 g/dL Final  . RDW 07/14/2015 17.1* 11.5 - 14.5 % Final  . Platelets 07/14/2015 409  150 - 440 K/uL Final  . Neutrophils Relative % 07/14/2015 88   Final  . Neutro Abs 07/14/2015 11.9* 1.4 - 6.5 K/uL Final  . Lymphocytes Relative 07/14/2015 8   Final  . Lymphs Abs 07/14/2015 1.1  1.0 - 3.6 K/uL Final  . Monocytes Relative 07/14/2015 4   Final  . Monocytes Absolute 07/14/2015 0.6  0.2 - 0.9 K/uL Final  . Eosinophils Relative 07/14/2015 0   Final  . Eosinophils Absolute 07/14/2015 0.0  0 - 0.7 K/uL Final  . Basophils Relative 07/14/2015 0   Final  . Basophils Absolute 07/14/2015 0.0  0 - 0.1 K/uL Final  . Sodium 07/14/2015 136  135 - 145 mmol/L Final  . Potassium 07/14/2015 3.6  3.5 - 5.1 mmol/L Final  . Chloride 07/14/2015 104  101 - 111 mmol/L Final  . CO2 07/14/2015 26  22 - 32 mmol/L Final  . Glucose, Bld 07/14/2015 122* 65 - 99 mg/dL Final  . BUN 07/14/2015 22* 6 - 20 mg/dL Final  . Creatinine, Ser 07/14/2015 0.83  0.44 - 1.00 mg/dL Final  . Calcium 07/14/2015 9.5  8.9 - 10.3 mg/dL Final  . Total Protein 07/14/2015 6.8  6.5 - 8.1 g/dL Final  . Albumin 07/14/2015 3.9  3.5 - 5.0 g/dL Final  . AST 07/14/2015 32  15 - 41 U/L Final  . ALT 07/14/2015 30  14 - 54 U/L Final  . Alkaline Phosphatase 07/14/2015 98  38 - 126 U/L Final  . Total Bilirubin 07/14/2015 0.7  0.3 - 1.2 mg/dL Final  . GFR  calc non Af Wyvonnia Lora  07/14/2015 >60  >60 mL/min Final  . GFR calc Af Amer 07/14/2015 >60  >60 mL/min Final   Comment: (NOTE) The eGFR has been calculated using the CKD EPI equation. This calculation has not been validated in all clinical situations. eGFR's persistently <60 mL/min signify possible Chronic Kidney Disease.   . Anion gap 07/14/2015 6  5 - 15 Final  . Magnesium 07/14/2015 2.2  1.7 - 2.4 mg/dL Final  . CA 125 07/14/2015 18.2  0.0 - 38.1 U/mL Final   Comment: (NOTE) Roche ECLIA methodology Performed At: [facility] [place], [place] [id number] Lindon Romp MD QP:[id number]      STUDIES: Pathology report as well as previous CT scan has been reviewed independently ASSESSMENT:  Endometrial carcinoma stage III C1 Lymphovascular invasion Endometrioid and serous carcinoma Regional lymph nodes are positive Aortic lymph nodes are negative washings were negative   PLAN: Patient has developed significant neuropathy impairing her quality of life has sensory ataxia.  Frequent fall.  Has to walk with the help of walker.  In view of all these findings chemotherapy with carboplatinum and Taxol discontinued. Patient will be referred to radiation oncologist for possibility of brachii therapy and pelvic radiation treatment Neurontin dose has been total 500 mg per day which would be continued and well tolerated Regarding urinary incontinence and prolapse of bladder patient has an appointment to see GYN oncology center will discuss further management issue.  Duration of visit is 25 minutes and 50% of time was spent discussing VARIOUS  optionsare with other physicians social worker Also at length discuss regarding discontinuing chemotherapy because of significant side effect impairing quality of life  Carcinoma of endometrium ([place])   Staging form: Corpus Uteri - Carcinoma, AJCC 7th Edition     Clinical: Stage IIIC1 (T1a, N1, M0) - Signed by Forest Gleason, MD on  04/30/2015   Forest Gleason, MD   07/18/2015

## 2015-07-21 ENCOUNTER — Inpatient Hospital Stay (HOSPITAL_BASED_OUTPATIENT_CLINIC_OR_DEPARTMENT_OTHER): Payer: Medicare Other | Admitting: Obstetrics and Gynecology

## 2015-07-21 ENCOUNTER — Inpatient Hospital Stay (HOSPITAL_BASED_OUTPATIENT_CLINIC_OR_DEPARTMENT_OTHER): Payer: Medicare Other | Admitting: Oncology

## 2015-07-21 ENCOUNTER — Inpatient Hospital Stay: Payer: Medicare Other

## 2015-07-21 ENCOUNTER — Ambulatory Visit
Admission: RE | Admit: 2015-07-21 | Discharge: 2015-07-21 | Disposition: A | Discharge status: Home / Self Care | Payer: Medicare Other | Source: Ambulatory Visit | Attending: Radiation Oncology | Admitting: Radiation Oncology

## 2015-07-21 ENCOUNTER — Encounter: Payer: Self-pay | Admitting: Obstetrics and Gynecology

## 2015-07-21 ENCOUNTER — Encounter: Payer: Self-pay | Admitting: Radiation Oncology

## 2015-07-21 VITALS — BP 102/64 | HR 75 | Temp 97.4°F | Ht 64.0 in | Wt 198.3 lb

## 2015-07-21 DIAGNOSIS — R5383 Other fatigue: Secondary | ICD-10-CM

## 2015-07-21 DIAGNOSIS — C541 Malignant neoplasm of endometrium: Secondary | ICD-10-CM | POA: Diagnosis not present

## 2015-07-21 DIAGNOSIS — Z9221 Personal history of antineoplastic chemotherapy: Secondary | ICD-10-CM | POA: Insufficient documentation

## 2015-07-21 DIAGNOSIS — R270 Ataxia, unspecified: Secondary | ICD-10-CM

## 2015-07-21 DIAGNOSIS — G609 Hereditary and idiopathic neuropathy, unspecified: Secondary | ICD-10-CM | POA: Diagnosis not present

## 2015-07-21 DIAGNOSIS — R531 Weakness: Secondary | ICD-10-CM

## 2015-07-21 DIAGNOSIS — R32 Unspecified urinary incontinence: Secondary | ICD-10-CM

## 2015-07-21 DIAGNOSIS — Z90710 Acquired absence of both cervix and uterus: Secondary | ICD-10-CM | POA: Diagnosis not present

## 2015-07-21 DIAGNOSIS — G620 Drug-induced polyneuropathy: Secondary | ICD-10-CM | POA: Diagnosis not present

## 2015-07-21 DIAGNOSIS — Z90722 Acquired absence of ovaries, bilateral: Secondary | ICD-10-CM

## 2015-07-21 DIAGNOSIS — T451X5S Adverse effect of antineoplastic and immunosuppressive drugs, sequela: Secondary | ICD-10-CM

## 2015-07-21 DIAGNOSIS — N8110 Cystocele, unspecified: Secondary | ICD-10-CM

## 2015-07-21 DIAGNOSIS — Z510 Encounter for antineoplastic radiation therapy: Secondary | ICD-10-CM | POA: Insufficient documentation

## 2015-07-21 DIAGNOSIS — G629 Polyneuropathy, unspecified: Secondary | ICD-10-CM | POA: Diagnosis not present

## 2015-07-21 DIAGNOSIS — R58 Hemorrhage, not elsewhere classified: Secondary | ICD-10-CM

## 2015-07-21 DIAGNOSIS — R262 Difficulty in walking, not elsewhere classified: Secondary | ICD-10-CM

## 2015-07-21 NOTE — Consult Note (Signed)
Except an outstanding is perfect of Radiation Oncology NEW PATIENT EVALUATION  Name: Susan [name]  MRN: [id number]  Date:   07/21/2015     DOB: 1934/08/01   This 81 y.o. female patient presents to the clinic for initial evaluation of stage IIIc 1 endometrial serous carcinoma status post TAH and sentinel lymph node biopsy of the pelvis status post initial chemotherapy now for radiation oncology opinion.  REFERRING PHYSICIAN: [name], MD  CHIEF COMPLAINT: No chief complaint on file.   DIAGNOSIS: The encounter diagnosis was Endometrial cancer ([place]).   PREVIOUS INVESTIGATIONS:  Pathology reports reviewed Clinical notes reviewed CT scan reviewed   HPI: Patient is a 81 year old female who presented with some postmenopausal spotting around Thanksgiving. Initial biopsy of her endometrium showed adenocarcinoma. CT scan demonstrated Fluid and/or soft tissue filling the endometrial cavity, potentially prolapsing through the cervical os into the upper vagina, compatible with the reported endometrial neoplasm. No definite evidence of metastatic disease in the abdomen or pelvis.She was seen by GYN oncology was taken to [facility] where she underwent TAH/BSO with pelvic lymph node sampling with uterus showing at the time of surgery a 5.1 cm lesion composed of serous carcinoma and endometrial adenocarcinoma. Tumor invaded to a 14 mm of the myometrium. Negative cervical involvement lymphovascular invasion was present. Sentinel lymph node showed minute focus of metastatic disease in the right external iliac, left external iliac lymph nodes. Patient was seen by medical oncology and started on carboplatinum and Taxol. She underwent 2 cycles and then developed significant neuropathy impacting her quality of life with this special significant peripheral peripheral neuropathy in her feet. Patient also has significant urinary incontinence even before the time of surgery. Patient has a history of ischemic  heart disease with coronary stents on Brilinta. At this time chemotherapy is being discontinued I've been asked to evaluate the patient for whole pelvic radiation.  PLANNED TREATMENT REGIMEN: Whole pelvic radiation plus vaginal brachytherapy  PAST MEDICAL HISTORY:  has a past medical history of Hypertension; Coronary artery disease; Hyperlipidemia; NSVT (nonsustained ventricular tachycardia) ([place]); Bradycardia; Uterine cancer ([place]); Myocardial infarction [facility]); and Heart murmur.    PAST SURGICAL HISTORY:  Past Surgical History  Procedure Laterality Date  . Left heart catheterization with coronary angiogram  10/19/2012    Procedure: LEFT HEART CATHETERIZATION WITH CORONARY ANGIOGRAM;  Surgeon: Troy Sine, MD;  Location: [facility] CATH LAB;  Service: Cardiovascular;;  . Percutaneous coronary stent intervention (pci-s)  10/19/2012    Procedure: PERCUTANEOUS CORONARY STENT INTERVENTION (PCI-S);  Surgeon: Troy Sine, MD;  Location: [facility] CATH LAB;  Service: Cardiovascular;;  . Percutaneous coronary stent intervention (pci-s) N/A 10/23/2012    Procedure: PERCUTANEOUS CORONARY STENT INTERVENTION (PCI-S);  Surgeon: Troy Sine, MD;  Location: [facility] CATH LAB;  Service: Cardiovascular;  Laterality: N/A;  . Abdominal hysterectomy    . Peripheral vascular catheterization N/A 05/05/2015    Procedure: Glori Luis Cath Insertion;  Surgeon: Algernon Huxley, MD;  Location: [place] CV LAB;  Service: Cardiovascular;  Laterality: N/A;    FAMILY HISTORY: family history includes Breast cancer in her mother; Diabetes in her brother and father; Heart disease in her father and mother; Stroke in her brother.  SOCIAL HISTORY:  reports that she has never smoked. She has never used smokeless tobacco. She reports that she drinks about 0.6 oz of alcohol per week. She reports that she does not use illicit drugs.  ALLERGIES: Penicillins  MEDICATIONS:  Current Outpatient Prescriptions  Medication Sig Dispense Refill  .  acetaminophen (TYLENOL) 500 MG tablet Take 500 mg by mouth every 6 (six) hours as needed.    [name] aspirin EC 81 MG tablet Take 81 mg by mouth daily.    [name] atorvastatin (LIPITOR) 80 MG tablet Take 1 tablet (80 mg total) by mouth daily at 6 PM. NEED OV. 30 tablet 0  . BRILINTA 60 MG TABS tablet TAKE 1 TABLET (60 MG TOTAL) BY MOUTH 2 (TWO) TIMES DAILY. 60 tablet 5  . dexamethasone (DECADRON) 4 MG tablet TAKE 1 TABLET BY MOUTH QD EXCEPT TWICE A DAY BEFORE EACH CHEMO 20 tablet 1  . ezetimibe (ZETIA) 10 MG tablet Take 1 tablet (10 mg total) by mouth daily. 30 tablet 11  . furosemide (LASIX) 20 MG tablet TAKE 1 TABLET BY MOUTH DAILY. 90 tablet 2  . gabapentin (NEURONTIN) 100 MG capsule TAKE 2 CAPSULES (200 MG TOTAL) BY MOUTH 2 (TWO) TIMES DAILY. 120 capsule 0  . gabapentin (NEURONTIN) 100 MG capsule Take 2 capsules (200 mg total) by mouth 2 (two) times daily. 120 capsule 3  . isosorbide mononitrate (IMDUR) 30 MG 24 hr tablet Take 1 tablet (30 mg total) by mouth daily. 30 tablet 11  . lidocaine-prilocaine (EMLA) cream Apply to affected area once 30 g 3  . loperamide (IMODIUM A-D) 2 MG tablet Take 2 mg by mouth 4 (four) times daily as needed for diarrhea or loose stools.    [name] losartan (COZAAR) 50 MG tablet Take 1 tablet (50 mg total) by mouth daily. 90 tablet 3  . metroNIDAZOLE (FLAGYL) 500 MG tablet Take 1 tablet (500 mg total) by mouth 3 (three) times daily. 42 tablet 0  . Multiple Vitamin (MULTIVITAMIN WITH MINERALS) TABS Take 1 tablet by mouth daily.    . nitroGLYCERIN (NITROSTAT) 0.4 MG SL tablet Place 1 tablet (0.4 mg total) under the tongue every 5 (five) minutes x 3 doses as needed for chest pain. 25 tablet 3  . omeprazole (PRILOSEC) 20 MG capsule Take 20 mg by mouth daily.    . ondansetron (ZOFRAN) 8 MG tablet Take 1 tablet (8 mg total) by mouth 2 (two) times daily as needed for refractory nausea / vomiting. Start on day 3 after chemo. 30 tablet 1  . oxyCODONE (OXY IR/ROXICODONE) 5 MG immediate release  tablet Take 5 mg by mouth every 4 (four) hours as needed. for pain  0  . Vitamin D, Cholecalciferol, 1000 UNITS CAPS Take 1 capsule by mouth daily.     No current facility-administered medications for this encounter.    ECOG PERFORMANCE STATUS:  1 - Symptomatic but completely ambulatory  REVIEW OF SYSTEMS: Except for the urinary incontinence and peripheral neuropathy and episodes of losing her balance Patient denies any weight loss, fatigue, weakness, fever, chills or night sweats. Patient denies any loss of vision, blurred vision. Patient denies any ringing  of the ears or hearing loss. No irregular heartbeat. Patient denies heart murmur or history of fainting. Patient denies any chest pain or pain radiating to her upper extremities. Patient denies any shortness of breath, difficulty breathing at night, cough or hemoptysis. Patient denies any swelling in the lower legs. Patient denies any nausea vomiting, vomiting of blood, or coffee ground material in the vomitus. Patient denies any stomach pain. Patient states has had normal bowel movements no significant constipation or diarrhea. Patient denies any dysuria, hematuria or significant nocturia. Patient denies any problems walking, swelling in the joints or loss of balance. Patient denies any skin changes, loss of hair or  loss of weight. Patient denies any excessive worrying or anxiety or significant depression. Patient denies any problems with insomnia. Patient denies excessive thirst, polyuria, polydipsia. Patient denies any swollen glands, patient denies easy bruising or easy bleeding. Patient denies any recent infections, allergies or URI. Patient "s visual fields have not changed significantly in recent time.    PHYSICAL EXAM: There were no vitals taken for this visit. She underwent a pelvic exam prior to my examination by Dr. Theora Gianotti showing excellent healing of the vaginal vault no evidence of mass or nodularity in the parametrial region or rectal  region. Well-developed well-nourished patient in NAD. HEENT reveals PERLA, EOMI, discs not visualized.  Oral cavity is clear. No oral mucosal lesions are identified. Neck is clear without evidence of cervical or supraclavicular adenopathy. Lungs are clear to A&P. Cardiac examination is essentially unremarkable with regular rate and rhythm without murmur rub or thrill. Abdomen is benign with no organomegaly or masses noted. Motor sensory and DTR levels are equal and symmetric in the upper and lower extremities. Cranial nerves II through XII are grossly intact. Proprioception is intact. No peripheral adenopathy or edema is identified. No motor or sensory levels are noted. Crude visual fields are within normal range.  LABORATORY DATA: Pathology reports reviewed    RADIOLOGY RESULTS: CT scan reviewed   IMPRESSION: Stage IIIc 1 serous endometrial adenocarcinoma status post TAH and sentinel lymph node biopsies of the pelvis in 81 year old female undergoing 2 cycles of chemotherapy now for whole pelvic radiation.  PLAN: At this time I to go ahead with whole pelvic radiation therapy to 4500 cGy over 5 weeks. I also would plan on delivering 1200 cGy using high dose rate remote afterloading through vaginal cylinder based on lymphovascular invasion. Risks and benefits of treatment including alteration of blood counts, possible diarrhea, possible increased lower urinary tract symptoms secondary to radiation cystitis, fatigue skin reaction all were discussed in detail. Will not be treating her urinary sphincter although I explained to her her symptoms may worsen with urinary incontinence secondary to radiation cystitis temporarily. She is being referred to urology for evaluation of her incontinence. I personally set up and ordered CT simulation early next week. Case was discussed personally with medical oncology as well as GYN oncology.  I would like to take this opportunity for allowing me to participate in the  care of your patient.Armstead Peaks., MD

## 2015-07-21 NOTE — Progress Notes (Signed)
Gynecologic Oncology Visit   Referring Provider: Dr Joylene Igo  Chief Concern: stage IIIC1serous endometrial cancer  Subjective:  Susan [name] is a 81 y.o. P39 female who is seen in consultation from Dr. Newman Nip for serous uterine cancer.  She presents today regarding urinary incontinence and prolapse of bladder. She stopped chemotherapy due to significant neuropathy limiting ambulation. She is scheduled to see Dr. Baruch Gouty today to discuss radiation.   Gynecologic Oncology  Susan [name] is a 81 y.o. 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  Please see complete details in prior notes.   Path report below shows stage IIIC1 disease.  Washings were negative, but she had 3 positive SLNs in pelvis.  An aortic SLN was negative.     A. Lymph node, sentinel, right external iliac, excision: Minute focus of metastatic carcinoma involving one lymph node (1/1). See note. Size of metastasis: Less than 0.1 mm. Negative for extranodal invasion.  Note: A cytokeratin AE1/3 highlights the metastatic focus.  B. Lymph node, sentinel, right external iliac #2, excision: Metastatic carcinoma involving one lymph node (1/1). See note. Size of metastasis: 0.5 mm. Negative for extranodal invasion. Note: A cytokeratin AE1/3 highlights the metastatic focus.  C. Lymph node, sentinel, right aortic, excision: One benign lymph node (0/1). Note: A cytokeratin AE1/3 immunostain is negative.  D. Lymph node, sentinel, left external iliac, excision:  Metastatic adenocarcinoma involving one lymph node (1/1).   Size of metastasis: 1 cm. Negative for extranodal invasion. Note: A cytokeratin AE1/3 highlights the metastatic focus.  E. Uterus, bilateral ovaries and fallopian tubes, hysterectomy and bilateral salpingo-oophorectomy: Mixed endometrial carcinoma composed of serous carcinoma and  endometrioid adenocarcinoma. Tumor size: 5.1 cm Extent of invasion: The tumor invades 2 mm into a 14 mm thick myometrium  Negative for cervical involvement. Lymphovascular invasion is present.  Recommendation to proceed with with carboplatinum and paclitaxel threes-4 cycles followed by radiation therapy followed by additional 2-3 cycles of chemotherapy. She started on chemotherapy on 05/12/2015 and received 2nd cycle on 06/02/2015. She developed significant neuropathy impairing her quality of life with complaints of sensory ataxia, frequent falls, and needs to walk with the help of walker. Chemotherapy was discontinued.   Problem List: Patient Active Problem List   Diagnosis Date Noted  . Absence of bladder continence 07/21/2015  . Postoperative state 04/08/2015  . Carcinoma of endometrium ([place]) 04/02/2015  . Endometrial cancer ([place]) 03/24/2015  . Gastro-esophageal reflux disease without esophagitis 02/15/2015  . Osteopenia 08/17/2014  . HLD (hyperlipidemia) 02/13/2014  . Kidney stones 10/27/2013  . CA skin, basal cell 06/30/2013  . Narrowing of intervertebral disc space 06/30/2013  . Accumulation of fluid in tissues 06/30/2013  . Benign essential HTN 06/30/2013  . Calculus of kidney 06/30/2013  . Lumbar canal stenosis 06/30/2013  . Arthritis, degenerative 06/30/2013  . Neuralgia neuritis, sciatic nerve 06/30/2013  . Edema 06/30/2013  . Cardiac murmur 06/30/2013  . Hematuria 12/19/2012  . CAD (coronary artery disease)- elective CFX DES 10/24/12 for recurrent angina 10/25/2012  . Dyslipidemia 10/25/2012  . NSVT (nonsustained ventricular tachycardia) ([place]) 10/25/2012  . Bradycardia- tolerating low dose beta blocker 10/25/2012  . Cardiomyopathy, ischemic: EF 35-40% by echo post-MI 10/20/12 10/22/2012  . STEMI 10/19/12- LAD DES, and CFX DES 10/24/12 10/20/2012  . HTN (hypertension) 10/20/2012    Past Medical History: Past Medical History  Diagnosis Date  . Hypertension   . Coronary  artery disease   .  Hyperlipidemia   . NSVT (nonsustained ventricular tachycardia) ([place])   . Bradycardia   . Uterine cancer ([place])     serrous  . Myocardial infarction ([place])   . Heart murmur     Past Surgical History: Past Surgical History  Procedure Laterality Date  . Left heart catheterization with coronary angiogram  10/19/2012    Procedure: LEFT HEART CATHETERIZATION WITH CORONARY ANGIOGRAM;  Surgeon: Troy Sine, MD;  Location: [facility] CATH LAB;  Service: Cardiovascular;;  . Percutaneous coronary stent intervention (pci-s)  10/19/2012    Procedure: PERCUTANEOUS CORONARY STENT INTERVENTION (PCI-S);  Surgeon: Troy Sine, MD;  Location: [facility] CATH LAB;  Service: Cardiovascular;;  . Percutaneous coronary stent intervention (pci-s) N/A 10/23/2012    Procedure: PERCUTANEOUS CORONARY STENT INTERVENTION (PCI-S);  Surgeon: Troy Sine, MD;  Location: [facility] CATH LAB;  Service: Cardiovascular;  Laterality: N/A;  . Abdominal hysterectomy    . Peripheral vascular catheterization N/A 05/05/2015    Procedure: Glori Luis Cath Insertion;  Surgeon: Algernon Huxley, MD;  Location: [facility] CV LAB;  Service: Cardiovascular;  Laterality: N/A;    Family History: Family History  Problem Relation Age of Onset  . Heart disease Mother   . Breast cancer Mother   . Heart disease Father   . Diabetes Father   . Diabetes Brother   . Stroke Brother     Social History: Social History   Social History  . Marital Status: Married    Spouse Name: N/A  . Number of Children: N/A  . Years of Education: N/A   Occupational History  . Not on file.   Social History Main Topics  . Smoking status: Never Smoker   . Smokeless tobacco: Never Used  . Alcohol Use: 0.6 oz/week    1 Glasses of wine per week  . Drug Use: No  . Sexual Activity: Yes   Other Topics Concern  . Not on file   Social History Narrative    Allergies: Allergies  Allergen Reactions  . Penicillins Rash    Current Medications: Current  Outpatient Prescriptions  Medication Sig Dispense Refill  . acetaminophen (TYLENOL) 500 MG tablet Take 500 mg by mouth every 6 (six) hours as needed.    [name] aspirin EC 81 MG tablet Take 81 mg by mouth daily.    [name] atorvastatin (LIPITOR) 80 MG tablet Take 1 tablet (80 mg total) by mouth daily at 6 PM. NEED OV. 30 tablet 0  . BRILINTA 60 MG TABS tablet TAKE 1 TABLET (60 MG TOTAL) BY MOUTH 2 (TWO) TIMES DAILY. 60 tablet 5  . dexamethasone (DECADRON) 4 MG tablet TAKE 1 TABLET BY MOUTH QD EXCEPT TWICE A DAY BEFORE EACH CHEMO 20 tablet 1  . ezetimibe (ZETIA) 10 MG tablet Take 1 tablet (10 mg total) by mouth daily. 30 tablet 11  . furosemide (LASIX) 20 MG tablet TAKE 1 TABLET BY MOUTH DAILY. 90 tablet 2  . gabapentin (NEURONTIN) 100 MG capsule TAKE 2 CAPSULES (200 MG TOTAL) BY MOUTH 2 (TWO) TIMES DAILY. 120 capsule 0  . gabapentin (NEURONTIN) 100 MG capsule Take 2 capsules (200 mg total) by mouth 2 (two) times daily. 120 capsule 3  . isosorbide mononitrate (IMDUR) 30 MG 24 hr tablet Take 1 tablet (30 mg total) by mouth daily. 30 tablet 11  . lidocaine-prilocaine (EMLA) cream Apply to affected area once 30 g 3  . loperamide (IMODIUM A-D) 2 MG tablet Take 2 mg by mouth 4 (four) times daily as needed for diarrhea  or loose stools.    [name] losartan (COZAAR) 50 MG tablet Take 1 tablet (50 mg total) by mouth daily. 90 tablet 3  . metroNIDAZOLE (FLAGYL) 500 MG tablet Take 1 tablet (500 mg total) by mouth 3 (three) times daily. 42 tablet 0  . Multiple Vitamin (MULTIVITAMIN WITH MINERALS) TABS Take 1 tablet by mouth daily.    . nitroGLYCERIN (NITROSTAT) 0.4 MG SL tablet Place 1 tablet (0.4 mg total) under the tongue every 5 (five) minutes x 3 doses as needed for chest pain. 25 tablet 3  . omeprazole (PRILOSEC) 20 MG capsule Take 20 mg by mouth daily.    . ondansetron (ZOFRAN) 8 MG tablet Take 1 tablet (8 mg total) by mouth 2 (two) times daily as needed for refractory nausea / vomiting. Start on day 3 after chemo. 30  tablet 1  . oxyCODONE (OXY IR/ROXICODONE) 5 MG immediate release tablet Take 5 mg by mouth every 4 (four) hours as needed. for pain  0  . Vitamin D, Cholecalciferol, 1000 UNITS CAPS Take 1 capsule by mouth daily.     No current facility-administered medications for this visit.    Review of Systems General: fatigue Pulmonary: negative for, dyspnea,  productive cough Gastrointestinal: negative for pain, distension/bloating, decreased appetite, n/v,constipation, diarrhea Genitourinary/Sexual: bladder function issues with incontinence. She has both stress incontinence and incontinence that is not precipitated or associated with urge. She can not control her urine and has had to use larger Poise pads since the neuropathy started.  Ob/Gyn: negative for pain or bleeding Musculoskeletal: back pain and leg swelling Hematology: No coagulation disorder, negative for, easy bruising, bleeding Neurologic/Psych: peripheral neuropathy  Objective:  Physical Examination:  BP 102/64 mmHg  Pulse 75  Temp(Src) 97.4 F (36.3 C) (Tympanic)  Ht 5\' 4"  (1.626 m)  Wt 198 lb 4.9 oz (89.95 kg)  BMI 34.02 kg/m2   ECOG Performance Status: 0 - Asymptomatic  General appearance: alert, cooperative and appears stated age HEENT:PERRLA, neck supple with midline trachea and thyroid without masses Lymph node survey: non-palpable, axillary, inguinal, supraclavicular Cardiovascular: regular rate and rhythm  Respiratory: normal air entry, lungs clear to auscultation and no rales Abdomen: well healed incisions, nontender Back: inspection of back is normal Extremities: extremities normal, atraumatic, no cyanosis or edema Neurological exam reveals alert, oriented, normal speech, she is walking with a cane  Pelvic: exam chaperoned by nurse;  Vulva: normal appearing vulva with no masses, tenderness or lesions; Vagina: normal vagina; cuff is well healed with no evidence of defects; Adnexa: no masses; Uterus/Cervix:surgically  absent; Rectal: no masses; sphincter tone moderate 3/5    Assessment:  Susan [name] is a 81 y.o. 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  She has three positive pelvic sentinel nodes and negative right aortic SLN and washings. Clinically NED. S/p 2 cycles of adjuvant carboplatin/paclitaxel chemotherapy notable for significant neuropathy interfering with ambulation. Urinary incontinence, worsening.  Ischemic heart disease with cornonary stents on Brilinta, but good functional status.  Problem List Items Addressed This Visit      Genitourinary   Endometrial cancer ([place])     Other   Absence of bladder continence - Primary    Other Visit Diagnoses    Hereditary and idiopathic peripheral neuropathy           We discussed neuropathy. I have recommended decreasing gabapentin dose as she is not having neuropathic pain but did have a fall associated with dizziness possible from the medication. We also discussed  vitamin B supplementation. She is interested in vitamin B6 supplementation and we will check her vitamin B12 level.   I have recommended Urology consultation for assessment of incontinence. I suspect she has multifactorial issues including stress, urge, and possibly compounded by peripheral neuropathy.   We will continue to follow closely with follow up exam in 3 months.   Gillis Ends, MD  CC:  Dr Joylene Igo

## 2015-07-21 NOTE — Progress Notes (Signed)
Chaperoned pelvic exam

## 2015-07-21 NOTE — Patient Instructions (Signed)
Vitamin B6 dose - over the counter take one tab twice a day.  Peripheral Neuropathy Peripheral neuropathy is a type of nerve damage. It affects nerves that carry signals between the spinal cord and other parts of the body. These are called peripheral nerves. With peripheral neuropathy, one nerve or a group of nerves may be damaged.  CAUSES  Many things can damage peripheral nerves. For some people with peripheral neuropathy, the cause is unknown. Some causes include:  Diabetes. This is the most common cause of peripheral neuropathy.  Injury to a nerve.  Pressure or stress on a nerve that lasts a long time.  Too little vitamin B. Alcoholism can lead to this.  Infections.  Autoimmune diseases, such as multiple sclerosis and systemic lupus erythematosus.  Inherited nerve diseases.  Some medicines, such as cancer drugs.  Toxic substances, such as lead and mercury.  Too little blood flowing to the legs.  Kidney disease.  Thyroid disease. SIGNS AND SYMPTOMS  Different people have different symptoms. The symptoms you have will depend on which of your nerves is damaged. Common symptoms include: 1. Loss of feeling (numbness) in the feet and hands. 2. Tingling in the feet and hands. 3. Pain that burns. 4. Very sensitive skin. 5. Weakness. 6. Not being able to move a part of the body (paralysis). 7. Muscle twitching. 8. Clumsiness or poor coordination. 9. Loss of balance. 10. Not being able to control your bladder. 11. Feeling dizzy. 12. Sexual problems. DIAGNOSIS  Peripheral neuropathy is a symptom, not a disease. Finding the cause of peripheral neuropathy can be hard. To figure that out, your health care provider will take a medical history and do a physical exam. A neurological exam will also be done. This involves checking things affected by your brain, spinal cord, and nerves (nervous system). For example, your health care provider will check your reflexes, how you move, and  what you can feel.  Other types of tests may also be ordered, such as:  Blood tests.  A test of the fluid in your spinal cord.  Imaging tests, such as CT scans or an MRI.  Electromyography (EMG). This test checks the nerves that control muscles.  Nerve conduction velocity tests. These tests check how fast messages pass through your nerves.  Nerve biopsy. A small piece of nerve is removed. It is then checked under a microscope. TREATMENT   Medicine is often used to treat peripheral neuropathy. Medicines may include:  Pain-relieving medicines. Prescription or over-the-counter medicine may be suggested.  Antiseizure medicine. This may be used for pain.  Antidepressants. These also may help ease pain from neuropathy.  Lidocaine. This is a numbing medicine. You might wear a patch or be given a shot.  Mexiletine. This medicine is typically used to help control irregular heart rhythms.  Surgery. Surgery may be needed to relieve pressure on a nerve or to destroy a nerve that is causing pain.  Physical therapy to help movement.  Assistive devices to help movement. HOME CARE INSTRUCTIONS   Only take over-the-counter or prescription medicines as directed by your health care provider. Follow the instructions carefully for any given medicines. Do not take any other medicines without first getting approval from your health care provider.  If you have diabetes, work closely with your health care provider to keep your blood sugar under control.  If you have numbness in your feet:  Check every day for signs of injury or infection. Watch for redness, warmth, and swelling.  Wear  padded socks and comfortable shoes. These help protect your feet.  Do not do things that put pressure on your damaged nerve.  Do not smoke. Smoking keeps blood from getting to damaged nerves.  Avoid or limit alcohol. Too much alcohol can cause a lack of B vitamins. These vitamins are needed for healthy  nerves.  Develop a good support system. Coping with peripheral neuropathy can be stressful. Talk to a mental health specialist or join a support group if you are struggling.  Follow up with your health care provider as directed. SEEK MEDICAL CARE IF:   You have new signs or symptoms of peripheral neuropathy.  You are struggling emotionally from dealing with peripheral neuropathy.  You have a fever. SEEK IMMEDIATE MEDICAL CARE IF:   You have an injury or infection that is not healing.  You feel very dizzy or begin vomiting.  You have chest pain.  You have trouble breathing.   This information is not intended to replace advice given to you by your health care provider. Make sure you discuss any questions you have with your health care provider.   Document Released: 03/03/2002 Document Revised: 11/23/2010 Document Reviewed: 11/18/2012 Elsevier Interactive Patient Education 2016 [employer]  Urinary Incontinence Urinary incontinence is the involuntary loss of urine from your bladder. CAUSES  There are many causes of urinary incontinence. They include:  Medicines.  Infections.  Prostatic enlargement, leading to overflow of urine from your bladder.  Surgery.  Neurological diseases.  Emotional factors. SIGNS AND SYMPTOMS Urinary Incontinence can be divided into four types: 13. Urge incontinence. Urge incontinence is the involuntary loss of urine before you have the opportunity to go to the bathroom. There is a sudden urge to void but not enough time to reach a bathroom. 14. Stress incontinence. Stress incontinence is the sudden loss of urine with any activity that forces urine to pass. It is commonly caused by anatomical changes to the pelvis and sphincter areas of your body. 15. Overflow incontinence. Overflow incontinence is the loss of urine from an obstructed opening to your bladder. This results in a backup of urine and a resultant buildup of pressure within the  bladder. When the pressure within the bladder exceeds the closing pressure of the sphincter, the urine overflows, which causes incontinence, similar to water overflowing a dam. 16. Total incontinence. Total incontinence is the loss of urine as a result of the inability to store urine within your bladder. DIAGNOSIS  Evaluating the cause of incontinence may require:  A thorough and complete medical and obstetric history.  A complete physical exam.  Laboratory tests such as a urine culture and sensitivities. When additional tests are indicated, they can include:  An ultrasound exam.  Kidney and bladder X-rays.  Cystoscopy. This is an exam of the bladder using a narrow scope.  Urodynamic testing to test the nerve function to the bladder and sphincter areas. TREATMENT  Treatment for urinary incontinence depends on the cause:  For urge incontinence caused by a bacterial infection, antibiotics will be prescribed. If the urge incontinence is related to medicines you take, your health care provider may have you change the medicine.  For stress incontinence, surgery to re-establish anatomical support to the bladder or sphincter, or both, will often correct the condition.  For overflow incontinence caused by an enlarged prostate, an operation to open the channel through the enlarged prostate will allow the flow of urine out of the bladder. In women with fibroids, a hysterectomy may be recommended.  For total incontinence, surgery on your urinary sphincter may help. An artificial urinary sphincter (an inflatable cuff placed around the urethra) may be required. In women who have developed a hole-like passage between their bladder and vagina (vesicovaginal fistula), surgery to close the fistula often is required. HOME CARE INSTRUCTIONS  Normal daily hygiene and the use of pads or adult diapers that are changed regularly will help prevent odors and skin damage.  Avoid caffeine. It can overstimulate  your bladder.  Use the bathroom regularly. Try about every 2-3 hours to go to the bathroom, even if you do not feel the need to do so. Take time to empty your bladder completely. After urinating, wait a minute. Then try to urinate again.  For causes involving nerve dysfunction, keep a log of the medicines you take and a journal of the times you go to the bathroom. SEEK MEDICAL CARE IF:  You experience worsening of pain instead of improvement in pain after your procedure.  Your incontinence becomes worse instead of better. SEE IMMEDIATE MEDICAL CARE IF:  You experience fever or shaking chills.  You are unable to pass your urine.  You have redness spreading into your groin or down into your thighs. MAKE SURE YOU:   Understand these instructions.   Will watch your condition.  Will get help right away if you are not doing well or get worse.   This information is not intended to replace advice given to you by your health care provider. Make sure you discuss any questions you have with your health care provider.   Document Released: 04/20/2004 Document Revised: 04/03/2014 Document Reviewed: 08/20/2012 Elsevier Interactive Patient Education [employer].

## 2015-07-21 NOTE — Progress Notes (Signed)
Patient ambulates with a cane, brought to exam room 14, accompanied by her husband.  Patient states she has chronic lower back  And bilateral foot pain. Medication record updated, information provided by patient.

## 2015-07-22 ENCOUNTER — Encounter: Payer: Self-pay | Admitting: Oncology

## 2015-07-22 LAB — VITAMIN B12: VITAMIN B 12: 1218 pg/mL — AB (ref 180–914)

## 2015-07-22 NOTE — Progress Notes (Addendum)
[place] @ [facility] Telephone:(336) [phone]  Fax:([phone]) [place]: [birth year]  MR#: [id number]  KPV#:[id number]  Patient Care Team: [name], MD as PCP - General (Family Medicine)  CHIEF COMPLAINT:  No chief complaint on file.  endometrial cancer stage IIIc 1  VISIT DIAGNOSIS:     ICD-9-CM ICD-10-CM   1. Endometrial cancer (HCC) 182.0 C54.1 Urine culture     CBC with Differential     Comprehensive metabolic panel     CA 675     Oncology History   Endometrial cancer stage IIIc      Carcinoma of endometrium ([place])   04/02/2015 Initial Diagnosis Carcinoma of endometrium ([place])      INTERVAL HISTORY: After Thanksgiving, for at least 7-10 days, she had noted dried blood on her pad. She did a U/A and took Cipro for 3 days but culture was negative for UTI. 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 She is still having the spotting. No itching or vaginal discharge. Denies pelvic pain.   Patient is on ticagrelor (Brilinta) for her h/o stent placement after MI in July 2014. She is very functional and teaches water aerobics. No chest pain or other cardiac symptoms.  81 year old lady was evaluated by GYN oncology is had a surgery done with   Hysterectomy ,bilateral oophorectomy and lymphadenectomy in January of 2017  Patient had received 2 cycles of chemotherapy with carboplatinum and Taxol.  Then developed significant neuropathy.  Recently he was given a break from the chemotherapy with absolutely no improvement in neuropathy.  Has difficulty maintaining balance started walking with the help of walker.  Erick Blinks recently and had ecchymoses and bruises in the back Neurontin is being increased.  Numbness and pain in lower extremity persist.  Also having increasing problems with urinary incontinence and prolapse bladder here for further follow-up and treatment consideration,  came today for the  follow-up has been seen today by radiation oncologist as well as by GYN oncologist. Patient continues to have problems with ambulation.  Walking with the help of a cane.  Incontinence of urine persists. REVIEW OF SYSTEMS:    general status: Patient is feeling weak and tired.  No change in a performance status.  No chills.  No fever.  HEENT: No headache or dizziness. Lungs: No cough or shortness of breath Cardiac: No chest pain or paroxysmal nocturnal dyspnea A senna has a history of coronary artery disease and stent placement GI: No nausea no vomiting no diarrhea no abdominal pain Skin: Ecchymosis on the back (had a fall) Lower extremity no swelling Neurological system: Significant numbness and pain in lower extremity.  Difficulty maintaining balance.  Difficulty in ambulation. Musculoskeletal system no bony pains GU: Patient had a recent surgery is some abdominal discomfort. Nausea vomiting diarrhea or rectal bleeding  As per HPI. Otherwise, a complete review of systems is negatve.  PAST MEDICAL HISTORY: Past Medical History  Diagnosis Date  . Hypertension   . Coronary artery disease   . Hyperlipidemia   . NSVT (nonsustained ventricular tachycardia) ([place])   . Bradycardia   . Uterine cancer ([place])     serrous  . Myocardial infarction ([place])   . Heart murmur     PAST SURGICAL HISTORY: Past Surgical History  Procedure Laterality Date  . Left heart catheterization with coronary angiogram  10/19/2012    Procedure: LEFT HEART CATHETERIZATION WITH CORONARY ANGIOGRAM;  Surgeon: Troy Sine, MD;  Location:  [place] CATH LAB;  Service: Cardiovascular;;  . Percutaneous coronary stent intervention (pci-s)  10/19/2012    Procedure: PERCUTANEOUS CORONARY STENT INTERVENTION (PCI-S);  Surgeon: Troy Sine, MD;  Location: [facility] CATH LAB;  Service: Cardiovascular;;  . Percutaneous coronary stent intervention (pci-s) N/A 10/23/2012    Procedure: PERCUTANEOUS CORONARY STENT INTERVENTION (PCI-S);   Surgeon: Troy Sine, MD;  Location: [facility] CATH LAB;  Service: Cardiovascular;  Laterality: N/A;  . Abdominal hysterectomy    . Peripheral vascular catheterization N/A 05/05/2015    Procedure: Glori Luis Cath Insertion;  Surgeon: Algernon Huxley, MD;  Location: [place] CV LAB;  Service: Cardiovascular;  Laterality: N/A;    FAMILY HISTORY Family History  Problem Relation Age of Onset  . Heart disease Mother   . Breast cancer Mother   . Heart disease Father   . Diabetes Father   . Diabetes Brother   . Stroke Brother     GYNECOLOGIC HISTORY:  No LMP recorded. Patient is postmenopausal.     ADVANCED DIRECTIVES:  Patient does not have any living will or healthcare power of attorney.  Information was given .  Available resources had been discussed.  We will follow-up on subsequent appointments regarding this issue  HEALTH MAINTENANCE: Social History  Substance Use Topics  . Smoking status: Never Smoker   . Smokeless tobacco: Never Used  . Alcohol Use: 0.6 oz/week    1 Glasses of wine per week      Allergies  Allergen Reactions  . Penicillins Rash    Current Outpatient Prescriptions  Medication Sig Dispense Refill  . acetaminophen (TYLENOL) 500 MG tablet Take 500 mg by mouth every 6 (six) hours as needed.    [name] aspirin EC 81 MG tablet Take 81 mg by mouth daily.    [name] atorvastatin (LIPITOR) 80 MG tablet Take 1 tablet (80 mg total) by mouth daily at 6 PM. NEED OV. 30 tablet 0  . BRILINTA 60 MG TABS tablet TAKE 1 TABLET (60 MG TOTAL) BY MOUTH 2 (TWO) TIMES DAILY. 60 tablet 5  . dexamethasone (DECADRON) 4 MG tablet TAKE 1 TABLET BY MOUTH QD EXCEPT TWICE A DAY BEFORE EACH CHEMO 20 tablet 1  . ezetimibe (ZETIA) 10 MG tablet Take 1 tablet (10 mg total) by mouth daily. 30 tablet 11  . furosemide (LASIX) 20 MG tablet TAKE 1 TABLET BY MOUTH DAILY. 90 tablet 2  . gabapentin (NEURONTIN) 100 MG capsule TAKE 2 CAPSULES (200 MG TOTAL) BY MOUTH 2 (TWO) TIMES DAILY. 120 capsule 0  . gabapentin  (NEURONTIN) 100 MG capsule Take 2 capsules (200 mg total) by mouth 2 (two) times daily. 120 capsule 3  . isosorbide mononitrate (IMDUR) 30 MG 24 hr tablet Take 1 tablet (30 mg total) by mouth daily. 30 tablet 11  . lidocaine-prilocaine (EMLA) cream Apply to affected area once 30 g 3  . loperamide (IMODIUM A-D) 2 MG tablet Take 2 mg by mouth 4 (four) times daily as needed for diarrhea or loose stools.    [name] losartan (COZAAR) 50 MG tablet Take 1 tablet (50 mg total) by mouth daily. 90 tablet 3  . metroNIDAZOLE (FLAGYL) 500 MG tablet Take 1 tablet (500 mg total) by mouth 3 (three) times daily. 42 tablet 0  . Multiple Vitamin (MULTIVITAMIN WITH MINERALS) TABS Take 1 tablet by mouth daily.    . nitroGLYCERIN (NITROSTAT) 0.4 MG SL tablet Place 1 tablet (0.4 mg total) under the tongue every 5 (five) minutes x 3 doses as needed for  chest pain. 25 tablet 3  . omeprazole (PRILOSEC) 20 MG capsule Take 20 mg by mouth daily.    . ondansetron (ZOFRAN) 8 MG tablet Take 1 tablet (8 mg total) by mouth 2 (two) times daily as needed for refractory nausea / vomiting. Start on day 3 after chemo. 30 tablet 1  . oxyCODONE (OXY IR/ROXICODONE) 5 MG immediate release tablet Take 5 mg by mouth every 4 (four) hours as needed. for pain  0  . Vitamin D, Cholecalciferol, 1000 UNITS CAPS Take 1 capsule by mouth daily.     No current facility-administered medications for this visit.    OBJECTIVE: PHYSICAL EXAM: Gen. status: Patient is alert oriented in mild distress because of postoperative pain Head exam was generally normal. There was no scleral icterus or corneal arcus. Mucous membranes were moist. Examination of the chest was unremarkable. There were no bony deformities, no asymmetry, and no other abnormalities. Cardiac: Systolic murmur.  Regular heart sounds. Abdomen: Soft.  Tenderness present.  Liver and spleen not palpable.  No masses palpable. Neurologically, the patient was awake, alert, and oriented to person, place  and time. Recently has significant sensory and motor impairment with peripheral neuropathy.  Sensory ataxia Examination of the skin revealed no evidence of significant rashes, suspicious appearing nevi or other concerning lesions. Lower extremity no edema Musculoskeletal system within normal limit  Skin: Ecchymosis on the left side of mid back There were no vitals filed for this visit.   There is no weight on file to calculate BMI.    ECOG FS:1 - Symptomatic but completely ambulatory  LAB RESULTS:  No visits with results within 5 Day(s) from this visit. Latest known visit with results is:  Appointment on 07/14/2015  Component Date Value Ref Range Status  . WBC 07/14/2015 13.5* 3.6 - 11.0 K/uL Final  . RBC 07/14/2015 3.95  3.80 - 5.20 MIL/uL Final  . Hemoglobin 07/14/2015 12.5  12.0 - 16.0 g/dL Final  . HCT 07/14/2015 36.1  35.0 - 47.0 % Final  . MCV 07/14/2015 91.3  80.0 - 100.0 fL Final  . MCH 07/14/2015 31.7  26.0 - 34.0 pg Final  . MCHC 07/14/2015 34.7  32.0 - 36.0 g/dL Final  . RDW 07/14/2015 17.1* 11.5 - 14.5 % Final  . Platelets 07/14/2015 409  150 - 440 K/uL Final  . Neutrophils Relative % 07/14/2015 88   Final  . Neutro Abs 07/14/2015 11.9* 1.4 - 6.5 K/uL Final  . Lymphocytes Relative 07/14/2015 8   Final  . Lymphs Abs 07/14/2015 1.1  1.0 - 3.6 K/uL Final  . Monocytes Relative 07/14/2015 4   Final  . Monocytes Absolute 07/14/2015 0.6  0.2 - 0.9 K/uL Final  . Eosinophils Relative 07/14/2015 0   Final  . Eosinophils Absolute 07/14/2015 0.0  0 - 0.7 K/uL Final  . Basophils Relative 07/14/2015 0   Final  . Basophils Absolute 07/14/2015 0.0  0 - 0.1 K/uL Final  . Sodium 07/14/2015 136  135 - 145 mmol/L Final  . Potassium 07/14/2015 3.6  3.5 - 5.1 mmol/L Final  . Chloride 07/14/2015 104  101 - 111 mmol/L Final  . CO2 07/14/2015 26  22 - 32 mmol/L Final  . Glucose, Bld 07/14/2015 122* 65 - 99 mg/dL Final  . BUN 07/14/2015 22* 6 - 20 mg/dL Final  . Creatinine, Ser 07/14/2015  0.83  0.44 - 1.00 mg/dL Final  . Calcium 07/14/2015 9.5  8.9 - 10.3 mg/dL Final  . Total Protein 07/14/2015 6.8  6.5 - 8.1 g/dL Final  . Albumin 07/14/2015 3.9  3.5 - 5.0 g/dL Final  . AST 07/14/2015 32  15 - 41 U/L Final  . ALT 07/14/2015 30  14 - 54 U/L Final  . Alkaline Phosphatase 07/14/2015 98  38 - 126 U/L Final  . Total Bilirubin 07/14/2015 0.7  0.3 - 1.2 mg/dL Final  . GFR calc non Af Amer 07/14/2015 >60  >60 mL/min Final  . GFR calc Af Amer 07/14/2015 >60  >60 mL/min Final   Comment: (NOTE) The eGFR has been calculated using the CKD EPI equation. This calculation has not been validated in all clinical situations. eGFR's persistently <60 mL/min signify possible Chronic Kidney Disease.   . Anion gap 07/14/2015 6  5 - 15 Final  . Magnesium 07/14/2015 2.2  1.7 - 2.4 mg/dL Final  . CA 125 07/14/2015 18.2  0.0 - 38.1 U/mL Final   Comment: (NOTE) Roche ECLIA methodology Performed At: [facility] [place], [place] [id number] Lindon Romp MD VG:[id number]      STUDIES: Pathology report as well as previous CT scan has been reviewed independently ASSESSMENT:  Endometrial carcinoma stage III C1 Lymphovascular invasion Endometrioid and serous carcinoma Regional lymph nodes are positive Aortic lymph nodes are negative washings were negative   PLAN: Due to significant neuropathy chemotherapy has been discontinued.  Dr. Theora Gianotti agrees with the plan. Patient has been evaluated by Dr. Donella Stade I had discussion with Dr. Donella Stade today is going to proceed with radiation therapy Dr. Theora Gianotti is examining the patient i, incontinence of urine was secondary to prolapsed bladder patient is going to be evaluated by urologist, Reevaluation of patient in 3 months after radiation therapy is finished and because of my planned retirement my associate will take care of the patient. 2.she  has been referred for physiotherapy because of difficulty in ambulation.  Carcinoma of  endometrium [facility])   Staging form: Corpus Uteri - Carcinoma, AJCC 7th Edition     Clinical: Stage IIIC1 (T1a, N1, M0) - Signed by Forest Gleason, MD on 04/30/2015   Forest Gleason, MD   07/22/2015

## 2015-07-23 LAB — URINE CULTURE: CULTURE: NO GROWTH

## 2015-07-26 ENCOUNTER — Other Ambulatory Visit: Payer: Self-pay | Admitting: *Deleted

## 2015-07-26 ENCOUNTER — Ambulatory Visit
Admission: RE | Admit: 2015-07-26 | Discharge: 2015-07-26 | Disposition: A | Discharge status: Home / Self Care | Payer: Medicare Other | Source: Ambulatory Visit | Attending: Radiation Oncology | Admitting: Radiation Oncology

## 2015-07-26 DIAGNOSIS — C541 Malignant neoplasm of endometrium: Secondary | ICD-10-CM | POA: Diagnosis not present

## 2015-07-26 MED ORDER — GABAPENTIN 100 MG PO CAPS: 200.0000 mg | ORAL_CAPSULE | Freq: Two times a day (BID) | ORAL | Status: DC

## 2015-07-27 ENCOUNTER — Other Ambulatory Visit: Payer: Self-pay | Admitting: *Deleted

## 2015-07-27 MED ORDER — GABAPENTIN 100 MG PO CAPS
200.0000 mg | ORAL_CAPSULE | Freq: Two times a day (BID) | ORAL | Status: DC
Start: 2015-07-27 — End: 2015-11-30

## 2015-07-28 DIAGNOSIS — C541 Malignant neoplasm of endometrium: Secondary | ICD-10-CM | POA: Diagnosis not present

## 2015-07-30 ENCOUNTER — Other Ambulatory Visit: Payer: Self-pay | Admitting: *Deleted

## 2015-07-30 DIAGNOSIS — C541 Malignant neoplasm of endometrium: Secondary | ICD-10-CM

## 2015-08-02 ENCOUNTER — Ambulatory Visit
Admission: RE | Admit: 2015-08-02 | Discharge: 2015-08-02 | Disposition: A | Discharge status: Home / Self Care | Payer: Medicare Other | Source: Ambulatory Visit | Attending: Radiation Oncology | Admitting: Radiation Oncology

## 2015-08-02 DIAGNOSIS — C541 Malignant neoplasm of endometrium: Secondary | ICD-10-CM | POA: Diagnosis not present

## 2015-08-03 ENCOUNTER — Other Ambulatory Visit: Payer: Self-pay | Admitting: Cardiovascular Disease

## 2015-08-03 ENCOUNTER — Ambulatory Visit
Admission: RE | Admit: 2015-08-03 | Discharge: 2015-08-03 | Disposition: A | Discharge status: Home / Self Care | Payer: Medicare Other | Source: Ambulatory Visit | Attending: Radiation Oncology | Admitting: Radiation Oncology

## 2015-08-03 DIAGNOSIS — C541 Malignant neoplasm of endometrium: Secondary | ICD-10-CM | POA: Diagnosis not present

## 2015-08-04 ENCOUNTER — Ambulatory Visit
Admission: RE | Admit: 2015-08-04 | Discharge: 2015-08-04 | Disposition: A | Discharge status: Home / Self Care | Payer: Medicare Other | Source: Ambulatory Visit | Attending: Radiation Oncology | Admitting: Radiation Oncology

## 2015-08-04 DIAGNOSIS — C541 Malignant neoplasm of endometrium: Secondary | ICD-10-CM | POA: Diagnosis not present

## 2015-08-05 ENCOUNTER — Ambulatory Visit
Admission: RE | Admit: 2015-08-05 | Discharge: 2015-08-05 | Disposition: A | Discharge status: Home / Self Care | Payer: Medicare Other | Source: Ambulatory Visit | Attending: Radiation Oncology | Admitting: Radiation Oncology

## 2015-08-05 DIAGNOSIS — C541 Malignant neoplasm of endometrium: Secondary | ICD-10-CM | POA: Diagnosis not present

## 2015-08-06 ENCOUNTER — Ambulatory Visit
Admission: RE | Admit: 2015-08-06 | Discharge: 2015-08-06 | Disposition: A | Discharge status: Home / Self Care | Payer: Medicare Other | Source: Ambulatory Visit | Attending: Radiation Oncology | Admitting: Radiation Oncology

## 2015-08-06 DIAGNOSIS — C541 Malignant neoplasm of endometrium: Secondary | ICD-10-CM | POA: Diagnosis not present

## 2015-08-08 ENCOUNTER — Other Ambulatory Visit: Payer: Self-pay | Admitting: Cardiovascular Disease

## 2015-08-09 ENCOUNTER — Ambulatory Visit
Admission: RE | Admit: 2015-08-09 | Discharge: 2015-08-09 | Disposition: A | Discharge status: Home / Self Care | Payer: Medicare Other | Source: Ambulatory Visit | Attending: Radiation Oncology | Admitting: Radiation Oncology

## 2015-08-09 ENCOUNTER — Telehealth: Payer: Self-pay | Admitting: Cardiovascular Disease

## 2015-08-09 DIAGNOSIS — C541 Malignant neoplasm of endometrium: Secondary | ICD-10-CM | POA: Diagnosis not present

## 2015-08-09 NOTE — Telephone Encounter (Signed)
Pt called in stating that she will be going out of town on 5/24 and she will be traveling by airplane. She wanted to know if she needed a note in order to take her CPAP machine on the plane with her. Please f/u with her. Please f/u with her.   Thanks

## 2015-08-09 NOTE — Telephone Encounter (Signed)
Rx request sent to pharmacy.  

## 2015-08-09 NOTE — Telephone Encounter (Signed)
Pt called back. Advised patient to contact her airline to see what their requirements are for traveling with a CPAP. She verbalized understanding.  She also wanted me to update Dr Claiborne Billings on her treatment progress with her endometrial cancer. She received 2 chemo treatments then it attacked her nervous system by giving her neuropathy in her hands and feet. Now they are doing radiation treatments. She just wanted Dr Claiborne Billings to be aware.  Will route to Dr Claiborne Billings for his information.

## 2015-08-09 NOTE — Telephone Encounter (Signed)
No answer. Left message to call back.   

## 2015-08-10 ENCOUNTER — Ambulatory Visit
Admission: RE | Admit: 2015-08-10 | Discharge: 2015-08-10 | Disposition: A | Discharge status: Home / Self Care | Payer: Medicare Other | Source: Ambulatory Visit | Attending: Radiation Oncology | Admitting: Radiation Oncology

## 2015-08-10 ENCOUNTER — Inpatient Hospital Stay: Payer: Medicare Other | Attending: Oncology

## 2015-08-10 DIAGNOSIS — Z90710 Acquired absence of both cervix and uterus: Secondary | ICD-10-CM | POA: Diagnosis not present

## 2015-08-10 DIAGNOSIS — C541 Malignant neoplasm of endometrium: Secondary | ICD-10-CM | POA: Diagnosis not present

## 2015-08-10 DIAGNOSIS — Z90722 Acquired absence of ovaries, bilateral: Secondary | ICD-10-CM | POA: Diagnosis not present

## 2015-08-10 LAB — CBC
HEMATOCRIT: 34.5 % — AB (ref 35.0–47.0)
HEMOGLOBIN: 12 g/dL (ref 12.0–16.0)
MCH: 32.1 pg (ref 26.0–34.0)
MCHC: 34.8 g/dL (ref 32.0–36.0)
MCV: 92.3 fL (ref 80.0–100.0)
Platelets: 280 10*3/uL (ref 150–440)
RBC: 3.74 MIL/uL — AB (ref 3.80–5.20)
RDW: 14.6 % — ABNORMAL HIGH (ref 11.5–14.5)
WBC: 5.9 10*3/uL (ref 3.6–11.0)

## 2015-08-11 ENCOUNTER — Ambulatory Visit
Admission: RE | Admit: 2015-08-11 | Discharge: 2015-08-11 | Disposition: A | Discharge status: Home / Self Care | Payer: Medicare Other | Source: Ambulatory Visit | Attending: Radiation Oncology | Admitting: Radiation Oncology

## 2015-08-11 DIAGNOSIS — C541 Malignant neoplasm of endometrium: Secondary | ICD-10-CM | POA: Diagnosis not present

## 2015-08-12 ENCOUNTER — Ambulatory Visit
Admission: RE | Admit: 2015-08-12 | Discharge: 2015-08-12 | Disposition: A | Discharge status: Home / Self Care | Payer: Medicare Other | Source: Ambulatory Visit | Attending: Radiation Oncology | Admitting: Radiation Oncology

## 2015-08-12 ENCOUNTER — Other Ambulatory Visit: Payer: Self-pay | Admitting: Cardiovascular Disease

## 2015-08-12 DIAGNOSIS — C541 Malignant neoplasm of endometrium: Secondary | ICD-10-CM | POA: Diagnosis not present

## 2015-08-12 NOTE — Telephone Encounter (Signed)
Rx request sent to pharmacy.  

## 2015-08-13 ENCOUNTER — Ambulatory Visit
Admission: RE | Admit: 2015-08-13 | Discharge: 2015-08-13 | Disposition: A | Discharge status: Home / Self Care | Payer: Medicare Other | Source: Ambulatory Visit | Attending: Radiation Oncology | Admitting: Radiation Oncology

## 2015-08-13 DIAGNOSIS — C541 Malignant neoplasm of endometrium: Secondary | ICD-10-CM | POA: Diagnosis not present

## 2015-08-16 ENCOUNTER — Ambulatory Visit
Admission: RE | Admit: 2015-08-16 | Discharge: 2015-08-16 | Disposition: A | Discharge status: Home / Self Care | Payer: Medicare Other | Source: Ambulatory Visit | Attending: Radiation Oncology | Admitting: Radiation Oncology

## 2015-08-16 DIAGNOSIS — C541 Malignant neoplasm of endometrium: Secondary | ICD-10-CM | POA: Diagnosis not present

## 2015-08-16 NOTE — Telephone Encounter (Signed)
ackowledged 

## 2015-08-17 ENCOUNTER — Ambulatory Visit
Admission: RE | Admit: 2015-08-17 | Discharge: 2015-08-17 | Disposition: A | Discharge status: Home / Self Care | Payer: Medicare Other | Source: Ambulatory Visit | Attending: Radiation Oncology | Admitting: Radiation Oncology

## 2015-08-17 ENCOUNTER — Inpatient Hospital Stay: Payer: Medicare Other

## 2015-08-17 DIAGNOSIS — C541 Malignant neoplasm of endometrium: Secondary | ICD-10-CM | POA: Diagnosis not present

## 2015-08-17 LAB — CBC
HCT: 34.1 % — ABNORMAL LOW (ref 35.0–47.0)
Hemoglobin: 11.7 g/dL — ABNORMAL LOW (ref 12.0–16.0)
MCH: 31.8 pg (ref 26.0–34.0)
MCHC: 34.2 g/dL (ref 32.0–36.0)
MCV: 92.8 fL (ref 80.0–100.0)
PLATELETS: 172 10*3/uL (ref 150–440)
RBC: 3.67 MIL/uL — AB (ref 3.80–5.20)
RDW: 14.6 % — ABNORMAL HIGH (ref 11.5–14.5)
WBC: 7.1 10*3/uL (ref 3.6–11.0)

## 2015-08-18 ENCOUNTER — Ambulatory Visit
Admission: RE | Admit: 2015-08-18 | Discharge: 2015-08-18 | Disposition: A | Discharge status: Home / Self Care | Payer: Medicare Other | Source: Ambulatory Visit | Attending: Radiation Oncology | Admitting: Radiation Oncology

## 2015-08-18 DIAGNOSIS — C541 Malignant neoplasm of endometrium: Secondary | ICD-10-CM | POA: Diagnosis not present

## 2015-08-19 ENCOUNTER — Ambulatory Visit: Payer: Medicare Other

## 2015-08-20 ENCOUNTER — Ambulatory Visit: Payer: Medicare Other

## 2015-08-24 ENCOUNTER — Ambulatory Visit: Payer: Medicare Other

## 2015-08-24 ENCOUNTER — Inpatient Hospital Stay: Payer: Medicare Other

## 2015-08-25 ENCOUNTER — Ambulatory Visit: Payer: Medicare Other

## 2015-08-26 ENCOUNTER — Ambulatory Visit
Admission: RE | Admit: 2015-08-26 | Discharge: 2015-08-26 | Disposition: A | Discharge status: Home / Self Care | Payer: Medicare Other | Source: Ambulatory Visit | Attending: Radiation Oncology | Admitting: Radiation Oncology

## 2015-08-26 DIAGNOSIS — C541 Malignant neoplasm of endometrium: Secondary | ICD-10-CM | POA: Diagnosis not present

## 2015-08-27 ENCOUNTER — Ambulatory Visit
Admission: RE | Admit: 2015-08-27 | Discharge: 2015-08-27 | Disposition: A | Discharge status: Home / Self Care | Payer: Medicare Other | Source: Ambulatory Visit | Attending: Radiation Oncology | Admitting: Radiation Oncology

## 2015-08-27 DIAGNOSIS — C541 Malignant neoplasm of endometrium: Secondary | ICD-10-CM | POA: Diagnosis not present

## 2015-08-30 ENCOUNTER — Ambulatory Visit: Payer: Medicare Other | Attending: Oncology

## 2015-08-30 ENCOUNTER — Ambulatory Visit
Admission: RE | Admit: 2015-08-30 | Discharge: 2015-08-30 | Disposition: A | Discharge status: Home / Self Care | Payer: Medicare Other | Source: Ambulatory Visit | Attending: Radiation Oncology | Admitting: Radiation Oncology

## 2015-08-30 DIAGNOSIS — M6281 Muscle weakness (generalized): Secondary | ICD-10-CM | POA: Diagnosis present

## 2015-08-30 DIAGNOSIS — R2681 Unsteadiness on feet: Secondary | ICD-10-CM | POA: Insufficient documentation

## 2015-08-30 DIAGNOSIS — C541 Malignant neoplasm of endometrium: Secondary | ICD-10-CM | POA: Diagnosis not present

## 2015-08-30 NOTE — Therapy (Signed)
[place] MAIN [facility] SERVICES [address] [place], [place], [address] Phone: [phone]   Fax:  [phone]  Physical Therapy Evaluation  Patient Details  Name: Susan [name] MRN: [id number] Date of Birth: Feb 26, 1935 Referring Provider: Jeb Levering  Encounter Date: 08/30/2015      PT End of Session - 08/30/15 1622    Visit Number 1   Number of Visits 17   Date for PT Re-Evaluation 09/27/15   Authorization Type 1/10   PT Start Time 1435   PT Stop Time 1525   PT Time Calculation (min) 50 min   Equipment Utilized During Treatment Gait belt   Activity Tolerance Patient tolerated treatment well   Behavior During Therapy [facility] for tasks assessed/performed      Past Medical History  Diagnosis Date  . Hypertension   . Coronary artery disease   . Hyperlipidemia   . NSVT (nonsustained ventricular tachycardia) ([place])   . Bradycardia   . Uterine cancer ([place])     serrous  . Myocardial infarction ([place])   . Heart murmur     Past Surgical History  Procedure Laterality Date  . Left heart catheterization with coronary angiogram  10/19/2012    Procedure: LEFT HEART CATHETERIZATION WITH CORONARY ANGIOGRAM;  Surgeon: Troy Sine, MD;  Location: [facility] CATH LAB;  Service: Cardiovascular;;  . Percutaneous coronary stent intervention (pci-s)  10/19/2012    Procedure: PERCUTANEOUS CORONARY STENT INTERVENTION (PCI-S);  Surgeon: Troy Sine, MD;  Location: [facility] CATH LAB;  Service: Cardiovascular;;  . Percutaneous coronary stent intervention (pci-s) N/A 10/23/2012    Procedure: PERCUTANEOUS CORONARY STENT INTERVENTION (PCI-S);  Surgeon: Troy Sine, MD;  Location: [facility] CATH LAB;  Service: Cardiovascular;  Laterality: N/A;  . Abdominal hysterectomy    . Peripheral vascular catheterization N/A 05/05/2015    Procedure: Glori Luis Cath Insertion;  Surgeon: Algernon Huxley, MD;  Location: [place] CV LAB;  Service: Cardiovascular;  Laterality: N/A;    There were no  vitals filed for this visit.       Subjective Assessment - 08/30/15 1444    Subjective pt reports having developed neuropathy in the feet and hands after chemo. she is currently undergoing radiation for uterine cancer. pt had a fall in the shower April 15th. she is a Press photographer 2 days as week. pt reports she has started using a cane outside of her home since Feb 2017.    Pertinent History chronic LBP, uterine CA, hx of MI   Patient Stated Goals Improve balance, walk without cane    Currently in Pain? Yes   Pain Score 6    Pain Location Back   Pain Orientation Lower   Pain Descriptors / Indicators Aching   Pain Type Chronic pain   Multiple Pain Sites Yes   Pain Score 3   Pain Location Shoulder   Pain Orientation Left   Pain Descriptors / Indicators Aching   Pain Type Acute pain   Pain Onset 1 to 4 weeks ago            [facility] PT Assessment - 08/30/15 1445    Assessment   Medical Diagnosis peripheral neuropathy   Referring Provider [facility]   Onset Date/Surgical Date 05/02/15   Next MD Visit 08/31/15   Prior Therapy no   Precautions   Precautions Fall   Restrictions   Weight Bearing Restrictions No   Balance Screen   Has the patient fallen in the past 6 months Yes  How many times? 1   Has the patient had a decrease in activity level because of a fear of falling?  Yes   Is the patient reluctant to leave their home because of a fear of falling?  Yes   [place] Private residence   Living Arrangements Spouse/significant other   Available Help at Discharge Family   Type of [place] to enter   Entrance Stairs-Number of Steps 5   Entrance Stairs-Rails Left   Home Layout Two level   Alternate Level Stairs-Number of Steps [place] - single point   Additional Comments --  PT adjusted cane to appropriate height    Prior Function   Level of Independence Independent with community mobility without  device   Vocation Retired   IT trainer   Light Touch Impaired by gross assessment;Impaired Detail   Light Touch Impaired Details Impaired LLE;Impaired RLE   Proprioception Appears Intact   Additional Comments difficulty with sharp/dull discrimination    Standardized Balance Assessment   Standardized Balance Assessment Berg Balance Test;Five Times Sit to Stand;10 meter walk test   Five times sit to stand comments  15.4   Berg Balance Test   Sit to Stand Able to stand without using hands and stabilize independently   Standing Unsupported Able to stand safely 2 minutes   Sitting with Back Unsupported but Feet Supported on Floor or Stool Able to sit safely and securely 2 minutes   Stand to Sit Sits safely with minimal use of hands   Transfers Able to transfer safely, minor use of hands   Standing Unsupported with Eyes Closed Able to stand 10 seconds with supervision   Standing Ubsupported with Feet Together Able to place feet together independently and stand for 1 minute with supervision   From Standing, Reach Forward with Outstretched Arm Can reach forward >12 cm safely (5")   From Standing Position, Pick up Object from Floor Able to pick up shoe, needs supervision   From Standing Position, Turn to Look Behind Over each Shoulder Turn sideways only but maintains balance   Turn 360 Degrees Able to turn 360 degrees safely but slowly   Standing Unsupported, Alternately Place Feet on Step/Stool Able to stand independently and complete 8 steps >20 seconds   Standing Unsupported, One Foot in Front Able to take small step independently and hold 30 seconds   Standing on One Leg Tries to lift leg/unable to hold 3 seconds but remains standing independently   Total Score 42         POSTURE/OBSERVATION:  forward flexed at hips in standing.  LE edema (pt reports this is baseline/chronic) L shoulder edema- pt reports this is recent onset. Pt  instructed to notify PCP  PROM/AROM: Limited L shoulder flexion due to swelling STRENGTH:  Graded on a 0-5 scale Muscle Group Left Right                          Hip Flex 4- 4  Hip Abd 4- 4-  Hip Add 3+ 3+  Hip Ext 3 3+  Hip IR/ER    Knee Flex 4 4+  Knee Ext 4 4+  Ankle DF 4- 4-  Ankle PF       SPECIAL TESTS: + [place] bilaterally Impaired calf flexibilty  FUNCTIONAL MOBILITY: Independent with bed mobility and transfers Bilateral knee valgus  during sit to stand  BALANCE: Impaired- see Berg  GAIT: Mild  L Trendelenburg. Impaired heel strike/toe off bilaterally, wide BOS. Impaired cadence    OUTCOME MEASURES: TEST Outcome Interpretation      10 meter walk test       0.78          m/s <1.0 m/s indicates increased risk for falls; limited community ambulator                                     PT Education - 08/30/15 1622    Education provided Yes   Education Details exam findings, POC   Person(s) Educated Patient   Methods Explanation   Comprehension Verbalized understanding             PT Long Term Goals - 08/30/15 1628    PT LONG TERM GOAL #1   Title pt will improve Berg balance score by 6pts to reduce fall risk    Time 8   Period Weeks   Status New   PT LONG TERM GOAL #2   Title pt will improve 15m walk speed to 1.2 m/s to improve her community mobility    Baseline 0.72m/s   Time 8   Period Weeks   Status New   PT LONG TERM GOAL #3   Title pt will improve 5xsit to stand time to <14.78m/s demonstrating improved LE strength   Time 8   Period Weeks   Status New   PT LONG TERM GOAL #4   Title pt will improve ankle strength to 4/5 to improve her static standing balance in the shower.    Time 8   Period Weeks   Status New               Plan - 08/30/15 1623    Clinical Impression Statement pt presents as 81 y/o F with recent history of uterine CA with chemo-related neuropathy. she reports this is improving now that she  is only having radiation. pt demonstrates impaired LE strength, impaired sensation, impaired balance and gait. pt would benefit form continued skilled PT services to reduce fall risk and maximize mobilty.    Rehab Potential Good   Clinical Impairments Affecting Rehab Potential neuropathy, chronic LBP, hx of CA, cardiac history, fatigue following CA treatment    PT Frequency 2x / week   PT Duration 8 weeks   PT Treatment/Interventions Aquatic Therapy;Gait training;Stair training;Therapeutic activities;Therapeutic exercise;Balance training;Neuromuscular re-education;Patient/family education;Cryotherapy;Vestibular   PT Next Visit Plan HEP   PT Home Exercise Plan ankle PF/DF in pool   Consulted and Agree with Plan of Care Patient      Patient will benefit from skilled therapeutic intervention in order to improve the following deficits and impairments:  Decreased strength, Abnormal gait, Impaired flexibility, Decreased activity tolerance, Decreased balance, Decreased range of motion, Impaired sensation, Pain, Decreased endurance  Visit Diagnosis: Unsteadiness on feet - Plan: PT plan of care cert/re-cert  Muscle weakness (generalized) - Plan: PT plan of care cert/re-cert      G-Codes - [id number]    Functional Assessment Tool Used 70mwalk/5xsittostand/berg   Functional Limitation Mobility: Walking and moving around   Mobility: Walking and Moving Around Current Status [id number]) At least 20 percent but less than 40 percent impaired, limited or restricted   Mobility: Walking and Moving Around Goal Status [id number]) At least 1 percent but less than 20 percent impaired, limited or  restricted       Problem List Patient Active Problem List   Diagnosis Date Noted  . Absence of bladder continence 07/21/2015  . Postoperative state 04/08/2015  . Carcinoma of endometrium ([place]) 04/02/2015  . Endometrial cancer ([place]) 03/24/2015  . Gastro-esophageal reflux disease without esophagitis 02/15/2015  .  Osteopenia 08/17/2014  . HLD (hyperlipidemia) 02/13/2014  . Kidney stones 10/27/2013  . CA skin, basal cell 06/30/2013  . Narrowing of intervertebral disc space 06/30/2013  . Accumulation of fluid in tissues 06/30/2013  . Benign essential HTN 06/30/2013  . Calculus of kidney 06/30/2013  . Lumbar canal stenosis 06/30/2013  . Arthritis, degenerative 06/30/2013  . Neuralgia neuritis, sciatic nerve 06/30/2013  . Edema 06/30/2013  . Cardiac murmur 06/30/2013  . Hematuria 12/19/2012  . CAD (coronary artery disease)- elective CFX DES 10/24/12 for recurrent angina 10/25/2012  . Dyslipidemia 10/25/2012  . NSVT (nonsustained ventricular tachycardia) ([place]) 10/25/2012  . Bradycardia- tolerating low dose beta blocker 10/25/2012  . Cardiomyopathy, ischemic: EF 35-40% by echo post-MI 10/20/12 10/22/2012  . STEMI 10/19/12- LAD DES, and CFX DES 10/24/12 10/20/2012  . HTN (hypertension) 10/20/2012   Gorden Harms. Cherilynn Schomburg, PT, DPT [id number]  Carrington Olazabal 08/30/2015, 4:37 PM  [place] MAIN [facility] SERVICES [address] [place], [place], [address] Phone: [phone]   Fax:  [phone]  Name: Susan [name] MRN: [id number] Date of Birth: 06-06-1934   f

## 2015-08-31 ENCOUNTER — Inpatient Hospital Stay: Payer: Medicare Other | Attending: Oncology

## 2015-08-31 ENCOUNTER — Ambulatory Visit
Admission: RE | Admit: 2015-08-31 | Discharge: 2015-08-31 | Disposition: A | Discharge status: Home / Self Care | Payer: Medicare Other | Source: Ambulatory Visit | Attending: Radiation Oncology | Admitting: Radiation Oncology

## 2015-08-31 DIAGNOSIS — C541 Malignant neoplasm of endometrium: Secondary | ICD-10-CM | POA: Diagnosis present

## 2015-08-31 DIAGNOSIS — Z90710 Acquired absence of both cervix and uterus: Secondary | ICD-10-CM | POA: Insufficient documentation

## 2015-08-31 DIAGNOSIS — Z90722 Acquired absence of ovaries, bilateral: Secondary | ICD-10-CM | POA: Insufficient documentation

## 2015-08-31 LAB — CBC
HEMATOCRIT: 32.4 % — AB (ref 35.0–47.0)
Hemoglobin: 11.4 g/dL — ABNORMAL LOW (ref 12.0–16.0)
MCH: 32.4 pg (ref 26.0–34.0)
MCHC: 35.1 g/dL (ref 32.0–36.0)
MCV: 92.2 fL (ref 80.0–100.0)
Platelets: 222 10*3/uL (ref 150–440)
RBC: 3.52 MIL/uL — ABNORMAL LOW (ref 3.80–5.20)
RDW: 14.3 % (ref 11.5–14.5)
WBC: 5.9 10*3/uL (ref 3.6–11.0)

## 2015-09-01 ENCOUNTER — Ambulatory Visit
Admission: RE | Admit: 2015-09-01 | Discharge: 2015-09-01 | Disposition: A | Discharge status: Home / Self Care | Payer: Medicare Other | Source: Ambulatory Visit | Attending: Radiation Oncology | Admitting: Radiation Oncology

## 2015-09-01 ENCOUNTER — Ambulatory Visit: Payer: Medicare Other

## 2015-09-01 DIAGNOSIS — C541 Malignant neoplasm of endometrium: Secondary | ICD-10-CM | POA: Diagnosis not present

## 2015-09-01 DIAGNOSIS — R2681 Unsteadiness on feet: Secondary | ICD-10-CM | POA: Diagnosis not present

## 2015-09-01 DIAGNOSIS — M6281 Muscle weakness (generalized): Secondary | ICD-10-CM

## 2015-09-01 NOTE — Patient Instructions (Signed)
[email or web address] Bil Standing marching, 3x10, 1x/day Bil Standing heel raises, 3x10, 1x/day Bil Standing toe raises in staggered stance with weight shifts, 3x10, 1x/day Bil Standing hip abd with RTB, 3x10, 1x/day Sidestepping with RTB, 3 laps, 1x/day Sit to stands with RTB, 3x10, 1x/day

## 2015-09-01 NOTE — Therapy (Addendum)
[place] MAIN [facility] SERVICES [address] [place], [place], [address] Phone: [phone]   Fax:  [phone]  Physical Therapy Treatment  Patient Details  Name: Susan [name] MRN: [id number] Date of Birth: [birth year] Referring Provider: Jeb Levering  Encounter Date: 09/01/2015      PT End of Session - 09/01/15 1533    Visit Number 2   Number of Visits 17   Date for PT Re-Evaluation 09/27/15   Authorization Type 2/10   PT Start Time 1430   PT Stop Time 1520   PT Time Calculation (min) 50 min   Equipment Utilized During Treatment Gait belt   Activity Tolerance Patient tolerated treatment well   Behavior During Therapy [facility] for tasks assessed/performed      Past Medical History  Diagnosis Date  . Hypertension   . Coronary artery disease   . Hyperlipidemia   . NSVT (nonsustained ventricular tachycardia) ([place])   . Bradycardia   . Uterine cancer ([place])     serrous  . Myocardial infarction ([place])   . Heart murmur     Past Surgical History  Procedure Laterality Date  . Left heart catheterization with coronary angiogram  10/19/2012    Procedure: LEFT HEART CATHETERIZATION WITH CORONARY ANGIOGRAM;  Surgeon: Troy Sine, MD;  Location: [facility] CATH LAB;  Service: Cardiovascular;;  . Percutaneous coronary stent intervention (pci-s)  10/19/2012    Procedure: PERCUTANEOUS CORONARY STENT INTERVENTION (PCI-S);  Surgeon: Troy Sine, MD;  Location: [facility] CATH LAB;  Service: Cardiovascular;;  . Percutaneous coronary stent intervention (pci-s) N/A 10/23/2012    Procedure: PERCUTANEOUS CORONARY STENT INTERVENTION (PCI-S);  Surgeon: Troy Sine, MD;  Location: [facility] CATH LAB;  Service: Cardiovascular;  Laterality: N/A;  . Abdominal hysterectomy    . Peripheral vascular catheterization N/A 05/05/2015    Procedure: Glori Luis Cath Insertion;  Surgeon: Algernon Huxley, MD;  Location: [place] CV LAB;  Service: Cardiovascular;  Laterality: N/A;    There were no  vitals filed for this visit.      Subjective Assessment - 09/01/15 1452    Subjective "I'm feeling a little tired today."   Pertinent History chronic LBP, uterine CA, hx of MI   Patient Stated Goals Improve balance, walk without cane    Currently in Pain? No/denies   Pain Onset 1 to 4 weeks ago      Therex: Nustep L 3 x 5 min (unbilled) bil standing marching 1x10 bil standing hip abd 1x10 bil standing heel raises 1x10 bil staggered stance toe raises 1x10 each; more difficulty raising L > R; mod verbal cues to decrease hip hinge and increase posterior weight shift Side stepping x 4 laps Sit <> stand with RTB, 1x10; min verbal cues to maintain hip abd throughout transfer Resisted walking fwd/retro, 12.5# x 5; min verbal cues for control with retro   NMR: Bil SLS with 2 finger hold to maintain balance, 3x10 sec each NBOS static standing w/ EC, 3x30 sec; min-mod sway, CGA  Ball toss with NBOS x 10 Ball toss on airex pad x25 with CGA                           PT Education - 09/01/15 1532    Education provided Yes   Education Details HEP, exercise technique    Person(s) Educated Patient   Methods Explanation   Comprehension Verbalized understanding  PT Long Term Goals - 08/30/15 1628    PT LONG TERM GOAL #1   Title pt will improve Berg balance score by 6pts to reduce fall risk    Time 8   Period Weeks   Status New   PT LONG TERM GOAL #2   Title pt will improve 26m walk speed to 1.2 m/s to improve her community mobility    Baseline 0.56m/s   Time 8   Period Weeks   Status New   PT LONG TERM GOAL #3   Title pt will improve 5xsit to stand time to <14.22m/s demonstrating improved LE strength   Time 8   Period Weeks   Status New   PT LONG TERM GOAL #4   Title pt will improve ankle strength to 4/5 to improve her static standing balance in the shower.    Time 8   Period Weeks   Status New               Plan - 09/01/15 1534     Clinical Impression Statement pt able to perform and tolerate balance and strengthening exercises today. She still demonstrates min-mod sway with NBOS and EC but able to maintain for 30 seconds each time with no LOB and only CGA. pt able to maintain balance while performing ball toss on airex pad and demonstrated good ankle and hip strategies throughout however, she did have more difficulty with recovering from  shifting her weight to the right. pt continues to have difficulty with SLS bilaterally and requires light support (2 fingers) to maintain balance. She was given an HEP and was instructed she could perform exercises on land and/or during her water aerobics classes. Pt needs continued skilled intervention to address remaining deficits to improve overall function and decrease risk of falls.   Rehab Potential Good   Clinical Impairments Affecting Rehab Potential neuropathy, chronic LBP, hx of CA, cardiac history, fatigue following CA treatment    PT Frequency 2x / week   PT Duration 8 weeks   PT Treatment/Interventions Aquatic Therapy;Gait training;Stair training;Therapeutic activities;Therapeutic exercise;Balance training;Neuromuscular re-education;Patient/family education;Cryotherapy;Vestibular   PT Next Visit Plan increase resistance/balance as tolerated   PT Home Exercise Plan see pt instructions tab above   Consulted and Agree with Plan of Care Patient      Patient will benefit from skilled therapeutic intervention in order to improve the following deficits and impairments:  Decreased strength, Abnormal gait, Impaired flexibility, Decreased activity tolerance, Decreased balance, Decreased range of motion, Impaired sensation, Pain, Decreased endurance  Visit Diagnosis: Unsteadiness on feet  Muscle weakness (generalized)     Problem List Patient Active Problem List   Diagnosis Date Noted  . Absence of bladder continence 07/21/2015  . Postoperative state 04/08/2015  . Carcinoma  of endometrium ([place]) 04/02/2015  . Endometrial cancer ([place]) 03/24/2015  . Gastro-esophageal reflux disease without esophagitis 02/15/2015  . Osteopenia 08/17/2014  . HLD (hyperlipidemia) 02/13/2014  . Kidney stones 10/27/2013  . CA skin, basal cell 06/30/2013  . Narrowing of intervertebral disc space 06/30/2013  . Accumulation of fluid in tissues 06/30/2013  . Benign essential HTN 06/30/2013  . Calculus of kidney 06/30/2013  . Lumbar canal stenosis 06/30/2013  . Arthritis, degenerative 06/30/2013  . Neuralgia neuritis, sciatic nerve 06/30/2013  . Edema 06/30/2013  . Cardiac murmur 06/30/2013  . Hematuria 12/19/2012  . CAD (coronary artery disease)- elective CFX DES 10/24/12 for recurrent angina 10/25/2012  . Dyslipidemia 10/25/2012  . NSVT (nonsustained ventricular tachycardia) ([place]) 10/25/2012  .  Bradycardia- tolerating low dose beta blocker 10/25/2012  . Cardiomyopathy, ischemic: EF 35-40% by echo post-MI 10/20/12 10/22/2012  . STEMI 10/19/12- LAD DES, and CFX DES 10/24/12 10/20/2012  . HTN (hypertension) 10/20/2012    Tayo Maute 09/01/2015, 3:48 PM  [place] MAIN [facility] SERVICES [address] [place], [place], [address] Phone: [phone]   Fax:  [phone]  Name: KATHA ROZARIO MRN: [id number] Date of Birth: 1934/08/14

## 2015-09-02 ENCOUNTER — Ambulatory Visit
Admission: RE | Admit: 2015-09-02 | Discharge: 2015-09-02 | Disposition: A | Discharge status: Home / Self Care | Payer: Medicare Other | Source: Ambulatory Visit | Attending: Radiation Oncology | Admitting: Radiation Oncology

## 2015-09-02 DIAGNOSIS — C541 Malignant neoplasm of endometrium: Secondary | ICD-10-CM | POA: Diagnosis not present

## 2015-09-03 ENCOUNTER — Ambulatory Visit
Admission: RE | Admit: 2015-09-03 | Discharge: 2015-09-03 | Disposition: A | Discharge status: Home / Self Care | Payer: Medicare Other | Source: Ambulatory Visit | Attending: Radiation Oncology | Admitting: Radiation Oncology

## 2015-09-03 DIAGNOSIS — C541 Malignant neoplasm of endometrium: Secondary | ICD-10-CM | POA: Diagnosis not present

## 2015-09-06 ENCOUNTER — Ambulatory Visit: Payer: Medicare Other

## 2015-09-06 ENCOUNTER — Ambulatory Visit
Admission: RE | Admit: 2015-09-06 | Discharge: 2015-09-06 | Disposition: A | Discharge status: Home / Self Care | Payer: Medicare Other | Source: Ambulatory Visit | Attending: Radiation Oncology | Admitting: Radiation Oncology

## 2015-09-06 VITALS — HR 96

## 2015-09-06 DIAGNOSIS — R2681 Unsteadiness on feet: Secondary | ICD-10-CM

## 2015-09-06 DIAGNOSIS — M6281 Muscle weakness (generalized): Secondary | ICD-10-CM

## 2015-09-06 DIAGNOSIS — C541 Malignant neoplasm of endometrium: Secondary | ICD-10-CM | POA: Diagnosis not present

## 2015-09-06 NOTE — Therapy (Signed)
[place] MAIN [facility] SERVICES [address] [place], [place], [address] Phone: [phone]   Fax:  [phone]  Physical Therapy Treatment  Patient Details  Name: Susan [name] MRN: [id number] Date of Birth: 1934/12/02 Referring Provider: Jeb Levering  Encounter Date: 09/06/2015      PT End of Session - 09/06/15 1516    Visit Number 3   Number of Visits 17   Date for PT Re-Evaluation 09/27/15   Authorization Type 3/10   PT Start Time 1435   PT Stop Time 1518   PT Time Calculation (min) 43 min   Equipment Utilized During Treatment Gait belt   Activity Tolerance Patient tolerated treatment well   Behavior During Therapy [facility] for tasks assessed/performed      Past Medical History  Diagnosis Date  . Hypertension   . Coronary artery disease   . Hyperlipidemia   . NSVT (nonsustained ventricular tachycardia) ([place])   . Bradycardia   . Uterine cancer ([place])     serrous  . Myocardial infarction ([place])   . Heart murmur     Past Surgical History  Procedure Laterality Date  . Left heart catheterization with coronary angiogram  10/19/2012    Procedure: LEFT HEART CATHETERIZATION WITH CORONARY ANGIOGRAM;  Surgeon: Troy Sine, MD;  Location: [facility] CATH LAB;  Service: Cardiovascular;;  . Percutaneous coronary stent intervention (pci-s)  10/19/2012    Procedure: PERCUTANEOUS CORONARY STENT INTERVENTION (PCI-S);  Surgeon: Troy Sine, MD;  Location: [facility] CATH LAB;  Service: Cardiovascular;;  . Percutaneous coronary stent intervention (pci-s) N/A 10/23/2012    Procedure: PERCUTANEOUS CORONARY STENT INTERVENTION (PCI-S);  Surgeon: Troy Sine, MD;  Location: [facility] CATH LAB;  Service: Cardiovascular;  Laterality: N/A;  . Abdominal hysterectomy    . Peripheral vascular catheterization N/A 05/05/2015    Procedure: Glori Luis Cath Insertion;  Surgeon: Algernon Huxley, MD;  Location: [place] CV LAB;  Service: Cardiovascular;  Laterality: N/A;    Filed Vitals:    09/06/15 1515  Pulse: 96  SpO2: 98%        Subjective Assessment - 09/06/15 1436    Subjective "I'm having diarrhea today. Not incontinent but have to go frequently"   Pertinent History chronic LBP, uterine CA, hx of MI   Patient Stated Goals Improve balance, walk without cane    Currently in Pain? No/denies   Pain Onset 1 to 4 weeks ago      therex: nustep x 5 min, L2, LE only (unbilled) Standing march with 3#, 3 x 10 Standing hip abd with 3#, 2x10 Alternating lunges, 3 x 5 each; mod verbal cues for proper technique  Pt required CGA throughout all exercises.   NMR large rockerboard balance w/ ball toss 2 x 15; demonstrated increase hip flexion throughout to maintain balance; 1 slight LOB fwd/bkwd tandem walk on long airex with BUE support x 5 laps;  Bil step ups with high knee with airex pad on top of 4" step with BUE support, 1 x 10;  Bil cone taps while on airex pad 5 x 3 each with light UE support (2 fingers only) Pt required CGA throughout all exercises.                            PT Education - 09/06/15 1515    Education provided Yes   Education Details exercise technique   Person(s) Educated Patient   Methods Explanation;Demonstration   Comprehension Verbalized  understanding             PT Long Term Goals - 08/30/15 1628    PT LONG TERM GOAL #1   Title pt will improve Berg balance score by 6pts to reduce fall risk    Time 8   Period Weeks   Status New   PT LONG TERM GOAL #2   Title pt will improve 40m walk speed to 1.2 m/s to improve her community mobility    Baseline 0.44m/s   Time 8   Period Weeks   Status New   PT LONG TERM GOAL #3   Title pt will improve 5xsit to stand time to <14.29m/s demonstrating improved LE strength   Time 8   Period Weeks   Status New   PT LONG TERM GOAL #4   Title pt will improve ankle strength to 4/5 to improve her static standing balance in the shower.    Time 8   Period Weeks   Status New                Plan - 09/06/15 1446    Clinical Impression Statement pt making steady progress in her balance and strength but presented to therapy a little more fatigued than usual which is likely due to her subjective complaints of diarrhea today as well as radiation treatment prior to therapy. she was able to participate in progressed strengthening exercises but self limited herself in reps due to fatigue. she was also able to participate in progressed balance exercises where she demonstrated increased hip flexion and required UE support occasionally to maintain balance. she continues to have deficits remaining in her dynamic balance and in her LE strength and  endurance and needs continued skilled PT to maximize overall function and decrease risk of falls.   Rehab Potential Good   Clinical Impairments Affecting Rehab Potential neuropathy, chronic LBP, hx of CA, cardiac history, fatigue following CA treatment    PT Frequency 2x / week   PT Duration 8 weeks   PT Treatment/Interventions Aquatic Therapy;Gait training;Stair training;Therapeutic activities;Therapeutic exercise;Balance training;Neuromuscular re-education;Patient/family education;Cryotherapy;Vestibular   PT Next Visit Plan increase resistance/balance as tolerated   PT Home Exercise Plan --   Consulted and Agree with Plan of Care Patient      Patient will benefit from skilled therapeutic intervention in order to improve the following deficits and impairments:  Decreased strength, Abnormal gait, Impaired flexibility, Decreased activity tolerance, Decreased balance, Decreased range of motion, Impaired sensation, Pain, Decreased endurance  Visit Diagnosis: Unsteadiness on feet  Muscle weakness (generalized)     Problem List Patient Active Problem List   Diagnosis Date Noted  . Absence of bladder continence 07/21/2015  . Postoperative state 04/08/2015  . Carcinoma of endometrium ([place]) 04/02/2015  . Endometrial cancer  ([place]) 03/24/2015  . Gastro-esophageal reflux disease without esophagitis 02/15/2015  . Osteopenia 08/17/2014  . HLD (hyperlipidemia) 02/13/2014  . Kidney stones 10/27/2013  . CA skin, basal cell 06/30/2013  . Narrowing of intervertebral disc space 06/30/2013  . Accumulation of fluid in tissues 06/30/2013  . Benign essential HTN 06/30/2013  . Calculus of kidney 06/30/2013  . Lumbar canal stenosis 06/30/2013  . Arthritis, degenerative 06/30/2013  . Neuralgia neuritis, sciatic nerve 06/30/2013  . Edema 06/30/2013  . Cardiac murmur 06/30/2013  . Hematuria 12/19/2012  . CAD (coronary artery disease)- elective CFX DES 10/24/12 for recurrent angina 10/25/2012  . Dyslipidemia 10/25/2012  . NSVT (nonsustained ventricular tachycardia) ([place]) 10/25/2012  . Bradycardia- tolerating low dose beta  blocker 10/25/2012  . Cardiomyopathy, ischemic: EF 35-40% by echo post-MI 10/20/12 10/22/2012  . STEMI 10/19/12- LAD DES, and CFX DES 10/24/12 10/20/2012  . HTN (hypertension) 10/20/2012   Geraldine Solar, SPT This entire session was performed under direct supervision and direction of a licensed therapist/therapist assistant . I have personally read, edited and approve of the note as written. Gorden Harms. Tortorici, PT, DPT [phone]   Tortorici,Ashley 09/06/2015, 6:42 PM  [place] MAIN [facility] SERVICES [address] [place], [place], [address] Phone: [phone]   Fax:  [phone]  Name: IRVIN HIBBITT MRN: [id number] Date of Birth: 11/12/1934

## 2015-09-07 ENCOUNTER — Ambulatory Visit
Admission: RE | Admit: 2015-09-07 | Discharge: 2015-09-07 | Disposition: A | Discharge status: Home / Self Care | Payer: Medicare Other | Source: Ambulatory Visit | Attending: Radiation Oncology | Admitting: Radiation Oncology

## 2015-09-07 DIAGNOSIS — C541 Malignant neoplasm of endometrium: Secondary | ICD-10-CM | POA: Diagnosis not present

## 2015-09-08 ENCOUNTER — Ambulatory Visit: Payer: Medicare Other

## 2015-09-08 ENCOUNTER — Ambulatory Visit
Admission: RE | Admit: 2015-09-08 | Discharge: 2015-09-08 | Disposition: A | Discharge status: Home / Self Care | Payer: Medicare Other | Source: Ambulatory Visit | Attending: Radiation Oncology | Admitting: Radiation Oncology

## 2015-09-08 DIAGNOSIS — R2681 Unsteadiness on feet: Secondary | ICD-10-CM | POA: Diagnosis not present

## 2015-09-08 DIAGNOSIS — M6281 Muscle weakness (generalized): Secondary | ICD-10-CM

## 2015-09-08 DIAGNOSIS — C541 Malignant neoplasm of endometrium: Secondary | ICD-10-CM | POA: Diagnosis not present

## 2015-09-08 NOTE — Therapy (Signed)
[place] MAIN [facility] SERVICES [address] [place], [place], [address] Phone: [phone]   Fax:  [phone]  Physical Therapy Treatment  Patient Details  Name: Susan [name] MRN: [id number] Date of Birth: May 21, 1934 Referring Provider: Jeb Levering  Encounter Date: 09/08/2015      PT End of Session - 09/08/15 1440    Visit Number 4   Number of Visits 17   Date for PT Re-Evaluation 09/27/15   Authorization Type 4/10   PT Start Time 1432   PT Stop Time 1515   PT Time Calculation (min) 43 min   Equipment Utilized During Treatment Gait belt   Activity Tolerance Patient tolerated treatment well   Behavior During Therapy [facility] for tasks assessed/performed      Past Medical History  Diagnosis Date  . Hypertension   . Coronary artery disease   . Hyperlipidemia   . NSVT (nonsustained ventricular tachycardia) ([place])   . Bradycardia   . Uterine cancer ([place])     serrous  . Myocardial infarction ([place])   . Heart murmur     Past Surgical History  Procedure Laterality Date  . Left heart catheterization with coronary angiogram  10/19/2012    Procedure: LEFT HEART CATHETERIZATION WITH CORONARY ANGIOGRAM;  Surgeon: Troy Sine, MD;  Location: [facility] CATH LAB;  Service: Cardiovascular;;  . Percutaneous coronary stent intervention (pci-s)  10/19/2012    Procedure: PERCUTANEOUS CORONARY STENT INTERVENTION (PCI-S);  Surgeon: Troy Sine, MD;  Location: [facility] CATH LAB;  Service: Cardiovascular;;  . Percutaneous coronary stent intervention (pci-s) N/A 10/23/2012    Procedure: PERCUTANEOUS CORONARY STENT INTERVENTION (PCI-S);  Surgeon: Troy Sine, MD;  Location: [facility] CATH LAB;  Service: Cardiovascular;  Laterality: N/A;  . Abdominal hysterectomy    . Peripheral vascular catheterization N/A 05/05/2015    Procedure: Glori Luis Cath Insertion;  Surgeon: Algernon Huxley, MD;  Location: [place] CV LAB;  Service: Cardiovascular;  Laterality: N/A;    There were no  vitals filed for this visit.      Subjective Assessment - 09/08/15 1440    Subjective pt reports the radiation is still giving her frequent bowels, but she took some medicine. she has no pain   Pertinent History chronic LBP, uterine CA, hx of MI   Patient Stated Goals Improve balance, walk without cane    Currently in Pain? No/denies   Pain Onset 1 to 4 weeks ago        Therex: Nustep L 3 x 5 min no charge Leg press 90lbs 3 x 10 min cues for cadence Side stepping red band x 3 laps in // bars  Monster walk fwd/retro red band x 4 laps in // bars Mini squat with light UE slide on // bars 2 x 10 with red band around knees  Pt requires min verbal and tactile cues for proper exercise performance    NMR: (no UE) Fwd step up /down AIREX 2 x 10 Side step over AIREX x 10 grape vine (cross in front) x 3 laps in // bars Toe taps onto Balance stones standing on AIREX  3 x min   pt requires CGA-min A for safety on balance exercises   pt needed intermittant seated rest due to fatigue.                             PT Long Term Goals - 08/30/15 1628    PT LONG TERM  GOAL #1   Title pt will improve Berg balance score by 6pts to reduce fall risk    Time 8   Period Weeks   Status New   PT LONG TERM GOAL #2   Title pt will improve 69m walk speed to 1.2 m/s to improve her community mobility    Baseline 0.19m/s   Time 8   Period Weeks   Status New   PT LONG TERM GOAL #3   Title pt will improve 5xsit to stand time to <14.23m/s demonstrating improved LE strength   Time 8   Period Weeks   Status New   PT LONG TERM GOAL #4   Title pt will improve ankle strength to 4/5 to improve her static standing balance in the shower.    Time 8   Period Weeks   Status New               Plan - 09/08/15 1650    Clinical Impression Statement pt found progression of balance exercises quite challenging today, she had a few LOB needing assist. she has a great difficulty on  compliant surfaces. some of her difficulty today is likley due to fatigue from side effects of radiation which will be finished Monday. pt would benefit from continued skilled PT services to further address balance, strength and endurance to maximzie function.    Rehab Potential Good   Clinical Impairments Affecting Rehab Potential neuropathy, chronic LBP, hx of CA, cardiac history, fatigue following CA treatment    PT Frequency 2x / week   PT Duration 8 weeks   PT Treatment/Interventions Aquatic Therapy;Gait training;Stair training;Therapeutic activities;Therapeutic exercise;Balance training;Neuromuscular re-education;Patient/family education;Cryotherapy;Vestibular   PT Next Visit Plan increase resistance/balance as tolerated   Consulted and Agree with Plan of Care Patient      Patient will benefit from skilled therapeutic intervention in order to improve the following deficits and impairments:  Decreased strength, Abnormal gait, Impaired flexibility, Decreased activity tolerance, Decreased balance, Decreased range of motion, Impaired sensation, Pain, Decreased endurance  Visit Diagnosis: Unsteadiness on feet  Muscle weakness (generalized)     Problem List Patient Active Problem List   Diagnosis Date Noted  . Absence of bladder continence 07/21/2015  . Postoperative state 04/08/2015  . Carcinoma of endometrium ([place]) 04/02/2015  . Endometrial cancer ([place]) 03/24/2015  . Gastro-esophageal reflux disease without esophagitis 02/15/2015  . Osteopenia 08/17/2014  . HLD (hyperlipidemia) 02/13/2014  . Kidney stones 10/27/2013  . CA skin, basal cell 06/30/2013  . Narrowing of intervertebral disc space 06/30/2013  . Accumulation of fluid in tissues 06/30/2013  . Benign essential HTN 06/30/2013  . Calculus of kidney 06/30/2013  . Lumbar canal stenosis 06/30/2013  . Arthritis, degenerative 06/30/2013  . Neuralgia neuritis, sciatic nerve 06/30/2013  . Edema 06/30/2013  . Cardiac murmur  06/30/2013  . Hematuria 12/19/2012  . CAD (coronary artery disease)- elective CFX DES 10/24/12 for recurrent angina 10/25/2012  . Dyslipidemia 10/25/2012  . NSVT (nonsustained ventricular tachycardia) ([place]) 10/25/2012  . Bradycardia- tolerating low dose beta blocker 10/25/2012  . Cardiomyopathy, ischemic: EF 35-40% by echo post-MI 10/20/12 10/22/2012  . STEMI 10/19/12- LAD DES, and CFX DES 10/24/12 10/20/2012  . HTN (hypertension) 10/20/2012   Gorden Harms. Raechell Singleton, PT, DPT [phone]  Paige Vanderwoude 09/08/2015, 4:53 PM  [place] MAIN [facility] SERVICES [address] [place], [place], [address] Phone: [phone]   Fax:  [phone]  Name: Susan [name] MRN: [id number] Date of Birth: 06/09/1934

## 2015-09-09 ENCOUNTER — Ambulatory Visit
Admission: RE | Admit: 2015-09-09 | Discharge: 2015-09-09 | Disposition: A | Discharge status: Home / Self Care | Payer: Medicare Other | Source: Ambulatory Visit | Attending: Radiation Oncology | Admitting: Radiation Oncology

## 2015-09-09 DIAGNOSIS — C541 Malignant neoplasm of endometrium: Secondary | ICD-10-CM | POA: Diagnosis not present

## 2015-09-10 ENCOUNTER — Ambulatory Visit
Admission: RE | Admit: 2015-09-10 | Discharge: 2015-09-10 | Disposition: A | Discharge status: Home / Self Care | Payer: Medicare Other | Source: Ambulatory Visit | Attending: Radiation Oncology | Admitting: Radiation Oncology

## 2015-09-10 DIAGNOSIS — C541 Malignant neoplasm of endometrium: Secondary | ICD-10-CM | POA: Diagnosis not present

## 2015-09-13 ENCOUNTER — Ambulatory Visit
Admission: RE | Admit: 2015-09-13 | Discharge: 2015-09-13 | Disposition: A | Discharge status: Home / Self Care | Payer: Medicare Other | Source: Ambulatory Visit | Attending: Radiation Oncology | Admitting: Radiation Oncology

## 2015-09-13 ENCOUNTER — Ambulatory Visit: Payer: Medicare Other

## 2015-09-13 DIAGNOSIS — R2681 Unsteadiness on feet: Secondary | ICD-10-CM

## 2015-09-13 DIAGNOSIS — M6281 Muscle weakness (generalized): Secondary | ICD-10-CM

## 2015-09-13 DIAGNOSIS — C541 Malignant neoplasm of endometrium: Secondary | ICD-10-CM | POA: Diagnosis not present

## 2015-09-13 NOTE — Therapy (Signed)
[place] MAIN [facility] SERVICES [address] [place], [place], [address] Phone: [phone]   Fax:  [phone]  Physical Therapy Treatment  Patient Details  Name: Susan [name] MRN: [id number] Date of Birth: 1935-03-24 Referring Provider: Jeb Levering  Encounter Date: 09/13/2015      PT End of Session - 09/13/15 1619    Visit Number 5   Number of Visits 17   Date for PT Re-Evaluation 09/27/15   Authorization Type 5/10   PT Start Time 1430   PT Stop Time 1515   PT Time Calculation (min) 45 min   Equipment Utilized During Treatment Gait belt   Activity Tolerance Patient tolerated treatment well   Behavior During Therapy [facility] for tasks assessed/performed      Past Medical History  Diagnosis Date  . Hypertension   . Coronary artery disease   . Hyperlipidemia   . NSVT (nonsustained ventricular tachycardia) ([place])   . Bradycardia   . 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 Myocardial infarction ([place])   . Heart murmur     Past Surgical History  Procedure Laterality Date  . Left heart catheterization with coronary angiogram  10/19/2012    Procedure: LEFT HEART CATHETERIZATION WITH CORONARY ANGIOGRAM;  Surgeon: Troy Sine, MD;  Location: [facility] CATH LAB;  Service: Cardiovascular;;  . Percutaneous coronary stent intervention (pci-s)  10/19/2012    Procedure: PERCUTANEOUS CORONARY STENT INTERVENTION (PCI-S);  Surgeon: Troy Sine, MD;  Location: [facility] CATH LAB;  Service: Cardiovascular;;  . Percutaneous coronary stent intervention (pci-s) N/A 10/23/2012    Procedure: PERCUTANEOUS CORONARY STENT INTERVENTION (PCI-S);  Surgeon: Troy Sine, MD;  Location: [facility] CATH LAB;  Service: Cardiovascular;  Laterality: N/A;  . Abdominal hysterectomy    . Peripheral vascular catheterization N/A 05/05/2015    Procedure: Glori Luis Cath Insertion;  Surgeon: Algernon Huxley, MD;  Location: [place] CV LAB;  Service: Cardiovascular;  Laterality: N/A;    There were no  vitals filed for this visit.      Subjective Assessment - 09/13/15 1613    Subjective pt reports she is happy to be done with radiation. she reports she hopes to feel less fatigued    Pertinent History chronic LBP, uterine CA, hx of MI   Patient Stated Goals Improve balance, walk without cane    Currently in Pain? No/denies   Pain Onset 1 to 4 weeks ago      ThereX: Resisted walking fwd/retro and side to side 7.5lbs x 10 laps fwd, 5 laps retro, 3 laps side to side on Cable Column  pt requires CGA for safety on balance exercises    NMR: Fwd step over boulster x 20 each leg no Ue Side step over boulster x 20 each leg no UE 1/2 roll AP balance 30s x 5 1/2 roll tandem balance 1 min each side   pt requires CGA for safety on balance exercises                             PT Education - 09/13/15 1614    Education provided Yes   Education Details weight shift during resisted walking   Person(s) Educated Patient   Methods Explanation   Comprehension Verbalized understanding             PT Long Term Goals - 08/30/15 1628    PT LONG TERM GOAL #1   Title pt will improve  Berg balance score by 6pts to reduce fall risk    Time 8   Period Weeks   Status New   PT LONG TERM GOAL #2   Title pt will improve 51m walk speed to 1.2 m/s to improve her community mobility    Baseline 0.61m/s   Time 8   Period Weeks   Status New   PT LONG TERM GOAL #3   Title pt will improve 5xsit to stand time to <14.66m/s demonstrating improved LE strength   Time 8   Period Weeks   Status New   PT LONG TERM GOAL #4   Title pt will improve ankle strength to 4/5 to improve her static standing balance in the shower.    Time 8   Period Weeks   Status New               Plan - 09/13/15 1621    Clinical Impression Statement pt reports appropriate muscle fatigue during exercises. she does need brief seated rest breaks due to fatigue. pt is doing well with progression of  strength and balance exercises    Rehab Potential Good   Clinical Impairments Affecting Rehab Potential neuropathy, chronic LBP, hx of CA, cardiac history, fatigue following CA treatment    PT Frequency 2x / week   PT Duration 8 weeks   PT Treatment/Interventions Aquatic Therapy;Gait training;Stair training;Therapeutic activities;Therapeutic exercise;Balance training;Neuromuscular re-education;Patient/family education;Cryotherapy;Vestibular   PT Next Visit Plan increase resistance/balance as tolerated   Consulted and Agree with Plan of Care Patient      Patient will benefit from skilled therapeutic intervention in order to improve the following deficits and impairments:  Decreased strength, Abnormal gait, Impaired flexibility, Decreased activity tolerance, Decreased balance, Decreased range of motion, Impaired sensation, Pain, Decreased endurance  Visit Diagnosis: Unsteadiness on feet  Muscle weakness (generalized)     Problem List Patient Active Problem List   Diagnosis Date Noted  . Absence of bladder continence 07/21/2015  . Postoperative state 04/08/2015  . Carcinoma of endometrium ([place]) 04/02/2015  . Endometrial cancer ([place]) 03/24/2015  . Gastro-esophageal reflux disease without esophagitis 02/15/2015  . Osteopenia 08/17/2014  . HLD (hyperlipidemia) 02/13/2014  . Kidney stones 10/27/2013  . CA skin, basal cell 06/30/2013  . Narrowing of intervertebral disc space 06/30/2013  . Accumulation of fluid in tissues 06/30/2013  . Benign essential HTN 06/30/2013  . Calculus of kidney 06/30/2013  . Lumbar canal stenosis 06/30/2013  . Arthritis, degenerative 06/30/2013  . Neuralgia neuritis, sciatic nerve 06/30/2013  . Edema 06/30/2013  . Cardiac murmur 06/30/2013  . Hematuria 12/19/2012  . CAD (coronary artery disease)- elective CFX DES 10/24/12 for recurrent angina 10/25/2012  . Dyslipidemia 10/25/2012  . NSVT (nonsustained ventricular tachycardia) ([place]) 10/25/2012  .  Bradycardia- tolerating low dose beta blocker 10/25/2012  . Cardiomyopathy, ischemic: EF 35-40% by echo post-MI 10/20/12 10/22/2012  . STEMI 10/19/12- LAD DES, and CFX DES 10/24/12 10/20/2012  . HTN (hypertension) 10/20/2012    Judye Lorino 09/13/2015, 4:24 PM  [place] MAIN [facility] SERVICES [address] [place], [place], [address] Phone: [phone]   Fax:  [phone]  Name: Susan [name] MRN: [id number] Date of Birth: [birth year]

## 2015-09-15 ENCOUNTER — Ambulatory Visit: Payer: Medicare Other

## 2015-09-15 DIAGNOSIS — R2681 Unsteadiness on feet: Secondary | ICD-10-CM

## 2015-09-15 DIAGNOSIS — M6281 Muscle weakness (generalized): Secondary | ICD-10-CM

## 2015-09-15 NOTE — Therapy (Signed)
[place] MAIN [facility] SERVICES [address] [place], [place], [address] Phone: [phone]   Fax:  [phone]  Physical Therapy Treatment  Patient Details  Name: Susan [name] MRN: [id number] Date of Birth: 14-Jul-1934 Referring Provider: Jeb Levering  Encounter Date: 09/15/2015      PT End of Session - 09/15/15 1537    Visit Number 6   Number of Visits 17   Date for PT Re-Evaluation 09/27/15   Authorization Type 6/10   PT Start Time 1435   PT Stop Time 1515   PT Time Calculation (min) 40 min   Equipment Utilized During Treatment Gait belt   Activity Tolerance Patient tolerated treatment well   Behavior During Therapy [facility] for tasks assessed/performed      Past Medical History  Diagnosis Date  . Hypertension   . Coronary artery disease   . Hyperlipidemia   . NSVT (nonsustained ventricular tachycardia) ([place])   . Bradycardia   . 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 Myocardial infarction ([place])   . Heart murmur     Past Surgical History  Procedure Laterality Date  . Left heart catheterization with coronary angiogram  10/19/2012    Procedure: LEFT HEART CATHETERIZATION WITH CORONARY ANGIOGRAM;  Surgeon: Troy Sine, MD;  Location: [facility] CATH LAB;  Service: Cardiovascular;;  . Percutaneous coronary stent intervention (pci-s)  10/19/2012    Procedure: PERCUTANEOUS CORONARY STENT INTERVENTION (PCI-S);  Surgeon: Troy Sine, MD;  Location: [facility] CATH LAB;  Service: Cardiovascular;;  . Percutaneous coronary stent intervention (pci-s) N/A 10/23/2012    Procedure: PERCUTANEOUS CORONARY STENT INTERVENTION (PCI-S);  Surgeon: Troy Sine, MD;  Location: [facility] CATH LAB;  Service: Cardiovascular;  Laterality: N/A;  . Abdominal hysterectomy    . Peripheral vascular catheterization N/A 05/05/2015    Procedure: Glori Luis Cath Insertion;  Surgeon: Algernon Huxley, MD;  Location: [place] CV LAB;  Service: Cardiovascular;  Laterality: N/A;    There were no  vitals filed for this visit.      Subjective Assessment - 09/15/15 1536    Subjective pt reports she has trouble carrying things through her home and keeping her balance. she reports she is still having diarhea   Pertinent History chronic LBP, uterine CA, hx of MI   Patient Stated Goals Improve balance, walk without cane    Currently in Pain? No/denies   Pain Onset 1 to 4 weeks ago       Therex: Leg press (single leg ) 75lbs 2 x 12 each leg  Resisted walking fwd x 5 laps, retro x 3 laps 7.5# cues for inc foot clearance and CGA to min A for safety  NMR: Dual tasking in hallway 2 x 64ft fwd/retro with ball bounce, cues for increased cadence/step length 52min fwd/retro ball bounce with verbal cues to change direction 2 x 68ft fwd ball toss+bounce Side to side ball pass with cues to change direction 2 x 3 min  min verbal cues to increase R foot clearance     pt requires CGA for safety on balance exercises                         PT Education - 09/15/15 1537    Education provided Yes   Education Details weight shift during resisted walking              PT Long Term Goals - 08/30/15 1628    PT LONG  TERM GOAL #1   Title pt will improve Berg balance score by 6pts to reduce fall risk    Time 8   Period Weeks   Status New   PT LONG TERM GOAL #2   Title pt will improve 4m walk speed to 1.2 m/s to improve her community mobility    Baseline 0.43m/s   Time 8   Period Weeks   Status New   PT LONG TERM GOAL #3   Title pt will improve 5xsit to stand time to <14.67m/s demonstrating improved LE strength   Time 8   Period Weeks   Status New   PT LONG TERM GOAL #4   Title pt will improve ankle strength to 4/5 to improve her static standing balance in the shower.    Time 8   Period Weeks   Status New               Plan - 09/15/15 1537    Clinical Impression Statement pt was challenged by activities which involved dual tasking evidenced by increased  postural sway and gait deviation. pt seemed more easily faitgued today vs previous treatments, needing a few seated rest breaks.    Rehab Potential Good   Clinical Impairments Affecting Rehab Potential neuropathy, chronic LBP, hx of CA, cardiac history, fatigue following CA treatment    PT Frequency 2x / week   PT Duration 8 weeks   PT Treatment/Interventions Aquatic Therapy;Gait training;Stair training;Therapeutic activities;Therapeutic exercise;Balance training;Neuromuscular re-education;Patient/family education;Cryotherapy;Vestibular   PT Next Visit Plan increase resistance/balance as tolerated   Consulted and Agree with Plan of Care Patient      Patient will benefit from skilled therapeutic intervention in order to improve the following deficits and impairments:  Decreased strength, Abnormal gait, Impaired flexibility, Decreased activity tolerance, Decreased balance, Decreased range of motion, Impaired sensation, Pain, Decreased endurance  Visit Diagnosis: Unsteadiness on feet  Muscle weakness (generalized)     Problem List Patient Active Problem List   Diagnosis Date Noted  . Absence of bladder continence 07/21/2015  . Postoperative state 04/08/2015  . Carcinoma of endometrium ([place]) 04/02/2015  . Endometrial cancer ([place]) 03/24/2015  . Gastro-esophageal reflux disease without esophagitis 02/15/2015  . Osteopenia 08/17/2014  . HLD (hyperlipidemia) 02/13/2014  . Kidney stones 10/27/2013  . CA skin, basal cell 06/30/2013  . Narrowing of intervertebral disc space 06/30/2013  . Accumulation of fluid in tissues 06/30/2013  . Benign essential HTN 06/30/2013  . Calculus of kidney 06/30/2013  . Lumbar canal stenosis 06/30/2013  . Arthritis, degenerative 06/30/2013  . Neuralgia neuritis, sciatic nerve 06/30/2013  . Edema 06/30/2013  . Cardiac murmur 06/30/2013  . Hematuria 12/19/2012  . CAD (coronary artery disease)- elective CFX DES 10/24/12 for recurrent angina 10/25/2012  .  Dyslipidemia 10/25/2012  . NSVT (nonsustained ventricular tachycardia) ([place]) 10/25/2012  . Bradycardia- tolerating low dose beta blocker 10/25/2012  . Cardiomyopathy, ischemic: EF 35-40% by echo post-MI 10/20/12 10/22/2012  . STEMI 10/19/12- LAD DES, and CFX DES 10/24/12 10/20/2012  . HTN (hypertension) 10/20/2012    Samanthan Dugo 09/15/2015, 3:39 PM  [place] MAIN [facility] SERVICES [address] [place], [place], [address] Phone: [phone]   Fax:  [phone]  Name: Susan [name] MRN: [id number] Date of Birth: 11/25/1934

## 2015-09-20 ENCOUNTER — Ambulatory Visit: Payer: Medicare Other

## 2015-09-20 ENCOUNTER — Inpatient Hospital Stay: Payer: Medicare Other

## 2015-09-20 ENCOUNTER — Ambulatory Visit
Admission: RE | Admit: 2015-09-20 | Discharge: 2015-09-20 | Disposition: A | Discharge status: Home / Self Care | Payer: Medicare Other | Source: Ambulatory Visit | Attending: Radiation Oncology | Admitting: Radiation Oncology

## 2015-09-20 ENCOUNTER — Encounter: Payer: Self-pay | Admitting: Radiation Oncology

## 2015-09-20 VITALS — BP 112/72 | HR 55 | Temp 97.2°F | Resp 20 | Wt 190.1 lb

## 2015-09-20 DIAGNOSIS — R2681 Unsteadiness on feet: Secondary | ICD-10-CM | POA: Diagnosis not present

## 2015-09-20 DIAGNOSIS — M6281 Muscle weakness (generalized): Secondary | ICD-10-CM

## 2015-09-20 DIAGNOSIS — C541 Malignant neoplasm of endometrium: Secondary | ICD-10-CM | POA: Diagnosis not present

## 2015-09-20 NOTE — Progress Notes (Signed)
Radiation Oncology Follow up Note  Name: Susan [name]   Date:   09/20/2015 MRN:  [id number] DOB: Aug 16, 1934    This 81 y.o. 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  REFERRING PROVIDER: [name], MD  HPI: Patient is a 81 year old female originally presented with postmenopausal spotting had endometrial biopsy showing adenocarcinoma. She underwent TAH/BSO with pelvic lymph node sampling. Tumor invaded 14 mm the myometrium negative cervical involvement although lymphovascular invasion was present. Lymph nodes showed multiple focus of metastatic disease in right external iliac and left external iliac lymph nodes. She was started on carboplatinum and Taxol underwent 2 cycles although developed significant neuropathy. She underwent pelvic radiation therapy which is complete a week ago and is done well. She still having some slight intermittent diarrhea. She does have issues is her prior to her surgery with urinary incontinence. She seen today for consideration of vaginal brachytherapy..  COMPLICATIONS OF TREATMENT: none  FOLLOW UP COMPLIANCE: keeps appointments   PHYSICAL EXAM:  BP 112/72 mmHg  Pulse 55  Temp(Src) 97.2 F (36.2 C)  Resp 20  Wt 190 lb 2.4 oz (86.25 kg) Well-developed well-nourished patient in NAD. HEENT reveals PERLA, EOMI, discs not visualized.  Oral cavity is clear. No oral mucosal lesions are identified. Neck is clear without evidence of cervical or supraclavicular adenopathy. Lungs are clear to A&P. Cardiac examination is essentially unremarkable with regular rate and rhythm without murmur rub or thrill. Abdomen is benign with no organomegaly or masses noted. Motor sensory and DTR levels are equal and symmetric in the upper and lower extremities. Cranial nerves II through XII are grossly intact. Proprioception is  intact. No peripheral adenopathy or edema is identified. No motor or sensory levels are noted. Crude visual fields are within normal range.  RADIOLOGY RESULTS: No current films for review  PLAN: At this time I to go ahead with vaginal brachytherapy. We'll plan on delivering 1200 cGy in 3 fractions at 400 cGy per fraction using high dose rate remote afterloading with 4 cm active length. Risks and benefits of treatment including possible exacerbation of her diarrhea urinary frequency and urgency fatigue and possible vaginal stenosis all were described in detail to the patient. I have set up and ordered CT simulation. Patient copy has my treatment plan well.  I would like to take this opportunity to thank you for allowing me to participate in the care of your patient.Armstead Peaks., MD

## 2015-09-21 NOTE — Therapy (Signed)
[place] MAIN [facility] SERVICES [address] [place], [place], [address] Phone: [phone]   Fax:  [phone]  Physical Therapy Treatment  Patient Details  Name: Susan [name] MRN: [id number] Date of Birth: [birth year] Referring Provider: Jeb Levering  Encounter Date: 09/20/2015      PT End of Session - 09/21/15 1014    Visit Number 7   Number of Visits 17   Date for PT Re-Evaluation 09/27/15   Authorization Type 7/10   PT Start Time 1430   PT Stop Time 1515   PT Time Calculation (min) 45 min   Equipment Utilized During Treatment Gait belt   Activity Tolerance Patient tolerated treatment well   Behavior During Therapy [facility] for tasks assessed/performed      Past Medical History  Diagnosis Date  . Hypertension   . Coronary artery disease   . Hyperlipidemia   . NSVT (nonsustained ventricular tachycardia) ([place])   . Bradycardia   . 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 Myocardial infarction ([place])   . Heart murmur     Past Surgical History  Procedure Laterality Date  . Left heart catheterization with coronary angiogram  10/19/2012    Procedure: LEFT HEART CATHETERIZATION WITH CORONARY ANGIOGRAM;  Surgeon: Troy Sine, MD;  Location: [facility] CATH LAB;  Service: Cardiovascular;;  . Percutaneous coronary stent intervention (pci-s)  10/19/2012    Procedure: PERCUTANEOUS CORONARY STENT INTERVENTION (PCI-S);  Surgeon: Troy Sine, MD;  Location: [facility] CATH LAB;  Service: Cardiovascular;;  . Percutaneous coronary stent intervention (pci-s) N/A 10/23/2012    Procedure: PERCUTANEOUS CORONARY STENT INTERVENTION (PCI-S);  Surgeon: Troy Sine, MD;  Location: [facility] CATH LAB;  Service: Cardiovascular;  Laterality: N/A;  . Abdominal hysterectomy    . Peripheral vascular catheterization N/A 05/05/2015    Procedure: Glori Luis Cath Insertion;  Surgeon: Algernon Huxley, MD;  Location: [place] CV LAB;  Service: Cardiovascular;  Laterality: N/A;    There were no  vitals filed for this visit.      Subjective Assessment - 09/21/15 1013    Subjective pt reports she is feeling a little better since stopping the radiation. she reports she has difficulty carrying items through her home while walking    Pertinent History chronic LBP, uterine CA, hx of MI   Patient Stated Goals Improve balance, walk without cane    Currently in Pain? No/denies   Pain Onset 1 to 4 weeks ago       NMR: Ball squat on wall 2 x 10 Standing on AIREX balloon volleyball against mirror 1 min x 3 Fwd step up /down off AIREX no UE 2 x 10 4 corner drill side stepping/fwd/retro stepping while holding 5lb item. X 10 CW/ x 10CCW, mod cues to increase L lean and L weight shift Toe taps on cone cues to promote L weight shift 3 x 10 Pt requires min verbal and tactile cues for proper exercise performance   Pt needs short rest between activities                          PT Education - 09/21/15 1014    Education provided Yes   Education Details promote more weight shift to the L during stance phase   Person(s) Educated Patient   Methods Explanation;Demonstration;Verbal cues;Tactile cues   Comprehension Verbalized understanding;Verbal cues required;Need further instruction;Tactile cues required  PT Long Term Goals - 08/30/15 1628    PT LONG TERM GOAL #1   Title pt will improve Berg balance score by 6pts to reduce fall risk    Time 8   Period Weeks   Status New   PT LONG TERM GOAL #2   Title pt will improve 35m walk speed to 1.2 m/s to improve her community mobility    Baseline 0.57m/s   Time 8   Period Weeks   Status New   PT LONG TERM GOAL #3   Title pt will improve 5xsit to stand time to <14.22m/s demonstrating improved LE strength   Time 8   Period Weeks   Status New   PT LONG TERM GOAL #4   Title pt will improve ankle strength to 4/5 to improve her static standing balance in the shower.    Time 8   Period Weeks   Status New                Plan - 09/21/15 1015    Clinical Impression Statement pt demonstrates increased R toe trag during most activities with R lean. she has difficulty shifting weight to the L and has reduced SLS balance on that side. addressed subjective complaint carrying 5lb item 4 directions. she did not have LOB but R postural lean and R foot drag, despite cues to increase weight shift.    Rehab Potential Good   Clinical Impairments Affecting Rehab Potential neuropathy, chronic LBP, hx of CA, cardiac history, fatigue following CA treatment    PT Frequency 2x / week   PT Duration 8 weeks   PT Treatment/Interventions Aquatic Therapy;Gait training;Stair training;Therapeutic activities;Therapeutic exercise;Balance training;Neuromuscular re-education;Patient/family education;Cryotherapy;Vestibular   PT Next Visit Plan increase resistance/balance as tolerated   Consulted and Agree with Plan of Care Patient      Patient will benefit from skilled therapeutic intervention in order to improve the following deficits and impairments:  Decreased strength, Abnormal gait, Impaired flexibility, Decreased activity tolerance, Decreased balance, Decreased range of motion, Impaired sensation, Pain, Decreased endurance  Visit Diagnosis: Unsteadiness on feet  Muscle weakness (generalized)     Problem List Patient Active Problem List   Diagnosis Date Noted  . Absence of bladder continence 07/21/2015  . Postoperative state 04/08/2015  . Carcinoma of endometrium ([place]) 04/02/2015  . Endometrial cancer ([place]) 03/24/2015  . Gastro-esophageal reflux disease without esophagitis 02/15/2015  . Osteopenia 08/17/2014  . HLD (hyperlipidemia) 02/13/2014  . Kidney stones 10/27/2013  . CA skin, basal cell 06/30/2013  . Narrowing of intervertebral disc space 06/30/2013  . Accumulation of fluid in tissues 06/30/2013  . Benign essential HTN 06/30/2013  . Calculus of kidney 06/30/2013  . Lumbar canal stenosis  06/30/2013  . Arthritis, degenerative 06/30/2013  . Neuralgia neuritis, sciatic nerve 06/30/2013  . Edema 06/30/2013  . Cardiac murmur 06/30/2013  . Hematuria 12/19/2012  . CAD (coronary artery disease)- elective CFX DES 10/24/12 for recurrent angina 10/25/2012  . Dyslipidemia 10/25/2012  . NSVT (nonsustained ventricular tachycardia) ([place]) 10/25/2012  . Bradycardia- tolerating low dose beta blocker 10/25/2012  . Cardiomyopathy, ischemic: EF 35-40% by echo post-MI 10/20/12 10/22/2012  . STEMI 10/19/12- LAD DES, and CFX DES 10/24/12 10/20/2012  . HTN (hypertension) 10/20/2012   Gorden Harms. Leslea Vowles, PT, DPT [phone]  Suzane Vanderweide 09/21/2015, 10:24 AM  [place] MAIN [facility] SERVICES [address] [place], [place], [address] Phone: [phone]   Fax:  [phone]  Name: Susan [name] MRN: [id number] Date of Birth:  05/12/1934     

## 2015-09-22 ENCOUNTER — Ambulatory Visit: Payer: Medicare Other

## 2015-09-22 DIAGNOSIS — M6281 Muscle weakness (generalized): Secondary | ICD-10-CM

## 2015-09-22 DIAGNOSIS — R2681 Unsteadiness on feet: Secondary | ICD-10-CM

## 2015-09-22 NOTE — Therapy (Addendum)
[place] MAIN [facility] SERVICES [address] [place], [place], [address] Phone: [phone]   Fax:  [phone]  Physical Therapy Treatment/ progress note  Patient Details  Name: Susan [name] MRN: [id number] Date of Birth: 02/06/1935 Referring Provider: Jeb Levering  Encounter Date: 09/22/2015      PT End of Session - 09/22/15 1446    Visit Number 8   Number of Visits 17   Date for PT Re-Evaluation 09/27/15   Authorization Type 8/10   PT Start Time 1435   PT Stop Time 1515   PT Time Calculation (min) 40 min   Equipment Utilized During Treatment Gait belt   Activity Tolerance Patient tolerated treatment well   Behavior During Therapy [facility] for tasks assessed/performed      Past Medical History  Diagnosis Date  . Hypertension   . Coronary artery disease   . Hyperlipidemia   . NSVT (nonsustained ventricular tachycardia) ([place])   . Bradycardia   . Uterine cancer ([place])     serrous  . Myocardial infarction ([place])   . Heart murmur     Past Surgical History  Procedure Laterality Date  . Left heart catheterization with coronary angiogram  10/19/2012    Procedure: LEFT HEART CATHETERIZATION WITH CORONARY ANGIOGRAM;  Surgeon: Troy Sine, MD;  Location: [facility] CATH LAB;  Service: Cardiovascular;;  . Percutaneous coronary stent intervention (pci-s)  10/19/2012    Procedure: PERCUTANEOUS CORONARY STENT INTERVENTION (PCI-S);  Surgeon: Troy Sine, MD;  Location: [facility] CATH LAB;  Service: Cardiovascular;;  . Percutaneous coronary stent intervention (pci-s) N/A 10/23/2012    Procedure: PERCUTANEOUS CORONARY STENT INTERVENTION (PCI-S);  Surgeon: Troy Sine, MD;  Location: [facility] CATH LAB;  Service: Cardiovascular;  Laterality: N/A;  . Abdominal hysterectomy    . Peripheral vascular catheterization N/A 05/05/2015    Procedure: Glori Luis Cath Insertion;  Surgeon: Algernon Huxley, MD;  Location: [place] CV LAB;  Service: Cardiovascular;  Laterality: N/A;     There were no vitals filed for this visit.      Subjective Assessment - 09/22/15 1443    Subjective pt reports falling Monday night at home as she went to sit food down on the nightstand. She reports that she did not hurt herself other than a minor scrape on her head from the fireplace.   Pertinent History chronic LBP, uterine CA, hx of MI   Patient Stated Goals Improve balance, walk without cane    Currently in Pain? No/denies   Pain Onset 1 to 4 weeks ago       Therex: SPT reassessed outcome measures and progress towards goals as follows:     [facility] PT Assessment - 09/22/15 0001    Standardized Balance Assessment   Standardized Balance Assessment 10 meter walk test   Five times sit to stand comments  13.2   10 Meter Walk 0.76ms   Berg Balance Test   Sit to Stand Able to stand without using hands and stabilize independently   Standing Unsupported Able to stand safely 2 minutes   Sitting with Back Unsupported but Feet Supported on Floor or Stool Able to sit safely and securely 2 minutes   Stand to Sit Sits safely with minimal use of hands   Transfers Able to transfer safely, minor use of hands   Standing Unsupported with Eyes Closed Able to stand 10 seconds safely   Standing Ubsupported with Feet Together Able to place feet together independently and stand 1 minute safely  From Standing, Reach Forward with Outstretched Arm Can reach forward >12 cm safely (5")   From Standing Position, Pick up Object from [place] to pick up shoe safely and easily   From Standing Position, Turn to Look Behind Over each Shoulder Looks behind one side only/other side shows less weight shift   Turn 360 Degrees Able to turn 360 degrees safely one side only in 4 seconds or less   Standing Unsupported, Alternately Place Feet on Step/Stool Able to stand independently and safely and complete 8 steps in 20 seconds   Standing Unsupported, One Foot in Front Able to take small step independently and  hold 30 seconds   Standing on One Leg Able to lift leg independently and hold equal to or more than 3 seconds   Total Score 49      THEREX Outcome measures: BERG: 49/56; mod risk for falls, 7 pt increase from initial eval (was significant risk) 5xSTS: 13.2s; mild impairment  10MWT: 0.61ms; mild impairment MMT: hip flexion 4+ BLE, knee flexion/extension 4+BLE, ankle DF 4- BLE NMR bil stance on airex pad picking up 2 5# plates from floor x 2; pt c/o dizziness so SPT elevated plates on 6" step, pt picked up and placed plates on step 5x with CGA, demonstrated poor lifting mechanics and required min verbal cues to improve; decreased c/o dizziness following.       PT Education - 09/22/15 1445    Education provided Yes   Education Details exercise technique, balance test findings   Person(s) Educated Patient   Methods Explanation   Comprehension Verbalized understanding             PT Long Term Goals - 09/22/15 1516    PT LONG TERM GOAL #1   Title pt will improve Berg balance score by 6pts to reduce fall risk    Time 8   Period Weeks   Status Achieved   PT LONG TERM GOAL #2   Title pt will improve 120malk speed to 1.2 m/s to improve her community mobility    Baseline 0.93544mwith no AD   Time 8   Period Weeks   Status Partially Met   PT LONG TERM GOAL #3   Title pt will improve 5xsit to stand time to <14.44m/94memonstrating improved LE strength   Time 8   Period Weeks   Status Achieved   PT LONG TERM GOAL #4   Title pt will improve ankle strength to 4/5 to improve her static standing balance in the shower.    Time 8   Period Weeks   Status On-going   PT LONG TERM GOAL #5   Title pt will be able to demonstrate proper lifting technique 3/5 times in order to demonstrate improved balance and decrease risk fo falls.    Time 4   Period Weeks   Status New               Plan - 09/22/15 1522    Clinical Impression Statement pt outcome measures reassessd today  and pt has demonstrated improvements in all. She has met 2 goals and partially met the other 2. Her balance has improved as evidenced by her 7 point increase in BERG balance score but is still at risk for falls and demonstrates the most difficulty with NBOS. pt demonstrates improper lifting mechanics as evidenced during exercises of picking objects up off the floor. pt needs continued skilled PT intervention to address remaining deficits in order to maximize  independence and overall function.    Rehab Potential Good   Clinical Impairments Affecting Rehab Potential neuropathy, chronic LBP, hx of CA, cardiac history, fatigue following CA treatment    PT Frequency 2x / week   PT Duration 8 weeks   PT Treatment/Interventions Aquatic Therapy;Gait training;Stair training;Therapeutic activities;Therapeutic exercise;Balance training;Neuromuscular re-education;Patient/family education;Cryotherapy;Vestibular   PT Next Visit Plan increase resistance/balance as tolerated   Consulted and Agree with Plan of Care Patient      Patient will benefit from skilled therapeutic intervention in order to improve the following deficits and impairments:  Decreased strength, Abnormal gait, Impaired flexibility, Decreased activity tolerance, Decreased balance, Decreased range of motion, Impaired sensation, Pain, Decreased endurance  Visit Diagnosis: Unsteadiness on feet  Muscle weakness (generalized)     Problem List Patient Active Problem List   Diagnosis Date Noted  . Absence of bladder continence 07/21/2015  . Postoperative state 04/08/2015  . Carcinoma of endometrium ([place]) 04/02/2015  . Endometrial cancer ([place]) 03/24/2015  . Gastro-esophageal reflux disease without esophagitis 02/15/2015  . Osteopenia 08/17/2014  . HLD (hyperlipidemia) 02/13/2014  . Kidney stones 10/27/2013  . CA skin, basal cell 06/30/2013  . Narrowing of intervertebral disc space 06/30/2013  . Accumulation of fluid in tissues 06/30/2013   . Benign essential HTN 06/30/2013  . Calculus of kidney 06/30/2013  . Lumbar canal stenosis 06/30/2013  . Arthritis, degenerative 06/30/2013  . Neuralgia neuritis, sciatic nerve 06/30/2013  . Edema 06/30/2013  . Cardiac murmur 06/30/2013  . Hematuria 12/19/2012  . CAD (coronary artery disease)- elective CFX DES 10/24/12 for recurrent angina 10/25/2012  . Dyslipidemia 10/25/2012  . NSVT (nonsustained ventricular tachycardia) ([place]) 10/25/2012  . Bradycardia- tolerating low dose beta blocker 10/25/2012  . Cardiomyopathy, ischemic: EF 35-40% by echo post-MI 10/20/12 10/22/2012  . STEMI 10/19/12- LAD DES, and CFX DES 10/24/12 10/20/2012  . HTN (hypertension) 10/20/2012   Geraldine Solar, SPT  Tortorici,Ashley 09/22/2015, 4:33 PM  [place] MAIN [facility] SERVICES [address] [place], [place], [address] Phone: [phone]   Fax:  [phone]  Name: Susan [name] MRN: [id number] Date of Birth: 12-31-1934

## 2015-09-25 DIAGNOSIS — C541 Malignant neoplasm of endometrium: Secondary | ICD-10-CM | POA: Insufficient documentation

## 2015-09-25 DIAGNOSIS — Z510 Encounter for antineoplastic radiation therapy: Secondary | ICD-10-CM | POA: Insufficient documentation

## 2015-09-27 ENCOUNTER — Ambulatory Visit: Payer: Medicare Other | Attending: Oncology

## 2015-09-27 DIAGNOSIS — M6281 Muscle weakness (generalized): Secondary | ICD-10-CM | POA: Diagnosis present

## 2015-09-27 DIAGNOSIS — R2681 Unsteadiness on feet: Secondary | ICD-10-CM

## 2015-09-27 NOTE — Therapy (Signed)
[place] MAIN [facility] SERVICES [address] [place], [place], [address] Phone: [phone]   Fax:  [phone]  Physical Therapy Treatment  Patient Details  Name: Susan [name] MRN: [id number] Date of Birth: June 11, 1934 Referring Provider: Jeb Levering  Encounter Date: 09/27/2015      PT End of Session - 09/27/15 1636    Visit Number 9   Number of Visits 17   Date for PT Re-Evaluation 10/20/15   Authorization Type 2/10   PT Start Time 1430   PT Stop Time 1515   PT Time Calculation (min) 45 min   Equipment Utilized During Treatment Gait belt   Activity Tolerance Patient tolerated treatment well   Behavior During Therapy [facility] for tasks assessed/performed      Past Medical History  Diagnosis Date  . Hypertension   . Coronary artery disease   . Hyperlipidemia   . NSVT (nonsustained ventricular tachycardia) ([place])   . Bradycardia   . Uterine cancer ([place])     serrous  . Myocardial infarction ([place])   . Heart murmur     Past Surgical History  Procedure Laterality Date  . Left heart catheterization with coronary angiogram  10/19/2012    Procedure: LEFT HEART CATHETERIZATION WITH CORONARY ANGIOGRAM;  Surgeon: Troy Sine, MD;  Location: [facility] CATH LAB;  Service: Cardiovascular;;  . Percutaneous coronary stent intervention (pci-s)  10/19/2012    Procedure: PERCUTANEOUS CORONARY STENT INTERVENTION (PCI-S);  Surgeon: Troy Sine, MD;  Location: [facility] CATH LAB;  Service: Cardiovascular;;  . Percutaneous coronary stent intervention (pci-s) N/A 10/23/2012    Procedure: PERCUTANEOUS CORONARY STENT INTERVENTION (PCI-S);  Surgeon: Troy Sine, MD;  Location: [facility] CATH LAB;  Service: Cardiovascular;  Laterality: N/A;  . Abdominal hysterectomy    . Peripheral vascular catheterization N/A 05/05/2015    Procedure: Glori Luis Cath Insertion;  Surgeon: Algernon Huxley, MD;  Location: [facility] CV LAB;  Service: Cardiovascular;  Laterality: N/A;    There were no  vitals filed for this visit.      Subjective Assessment - 09/27/15 1636    Subjective pt reports no new falls.    Pertinent History chronic LBP, uterine CA, hx of MI   Patient Stated Goals Improve balance, walk without cane    Currently in Pain? No/denies   Pain Onset 1 to 4 weeks ago       NMR: BOSU lunge x 20 each side 1 UE cues to reduce knee valgus Fwd step up onto 6in step x 10 each leg 1 UE In  Hallway duel tasking 4 x 61f finding cards on wall Fwd / retro directional changes in hallway 2 x 80 ft Fwd / retro "stop" commands in hallway x 3 min Ball toss + bounce in hallway 4 x 872f Figure 8 walk with ball bounce 2 x  52m56m pt requires CGA for safety on balance exercises                            PT Education - 09/27/15 1636    Education provided Yes   Education Details progression of dynamic balance / challenge   Person(s) Educated Patient   Methods Explanation   Comprehension Verbalized understanding             PT Long Term Goals - 09/22/15 1516    PT LONG TERM GOAL #1   Title pt will improve Berg balance score by 6pts to  reduce fall risk    Time 8   Period Weeks   Status Achieved   PT LONG TERM GOAL #2   Title pt will improve 85mwalk speed to 1.2 m/s to improve her community mobility    Baseline 0.964m with no AD   Time 8   Period Weeks   Status Partially Met   PT LONG TERM GOAL #3   Title pt will improve 5xsit to stand time to <14.20m53mdemonstrating improved LE strength   Time 8   Period Weeks   Status Achieved   PT LONG TERM GOAL #4   Title pt will improve ankle strength to 4/5 to improve her static standing balance in the shower.    Time 8   Period Weeks   Status On-going   PT LONG TERM GOAL #5   Title pt will be able to demonstrate proper lifting technique 3/5 times in order to demonstrate improved balance and decrease risk fo falls.    Time 4   Period Weeks   Status New               Plan - 09/27/15  1636    Clinical Impression Statement pt did fairly well with progression of dynamic balance activities / multitasking. she was fatigued at end of session. focused also on hip strengthening particularly functional when ascending / descending stairs when reducing knee valgus at that time.    Rehab Potential Good   Clinical Impairments Affecting Rehab Potential neuropathy, chronic LBP, hx of CA, cardiac history, fatigue following CA treatment    PT Frequency 2x / week   PT Duration 8 weeks   PT Treatment/Interventions Aquatic Therapy;Gait training;Stair training;Therapeutic activities;Therapeutic exercise;Balance training;Neuromuscular re-education;Patient/family education;Cryotherapy;Vestibular   PT Next Visit Plan increase resistance/balance as tolerated   Consulted and Agree with Plan of Care Patient      Patient will benefit from skilled therapeutic intervention in order to improve the following deficits and impairments:  Decreased strength, Abnormal gait, Impaired flexibility, Decreased activity tolerance, Decreased balance, Decreased range of motion, Impaired sensation, Pain, Decreased endurance  Visit Diagnosis: Unsteadiness on feet  Muscle weakness (generalized)     Problem List Patient Active Problem List   Diagnosis Date Noted  . Absence of bladder continence 07/21/2015  . Postoperative state 04/08/2015  . Carcinoma of endometrium ([place]CCMokelumne Hill1/08/2015  . Endometrial cancer (HCC[place]2/28/2016  . Gastro-esophageal reflux disease without esophagitis 02/15/2015  . Osteopenia 08/17/2014  . HLD (hyperlipidemia) 02/13/2014  . Kidney stones 10/27/2013  . CA skin, basal cell 06/30/2013  . Narrowing of intervertebral disc space 06/30/2013  . Accumulation of fluid in tissues 06/30/2013  . Benign essential HTN 06/30/2013  . Calculus of kidney 06/30/2013  . Lumbar canal stenosis 06/30/2013  . Arthritis, degenerative 06/30/2013  . Neuralgia neuritis, sciatic nerve 06/30/2013  . Edema  06/30/2013  . Cardiac murmur 06/30/2013  . Hematuria 12/19/2012  . CAD (coronary artery disease)- elective CFX DES 10/24/12 for recurrent angina 10/25/2012  . Dyslipidemia 10/25/2012  . NSVT (nonsustained ventricular tachycardia) ([place]CC[place]8/03/2012  . Bradycardia- tolerating low dose beta blocker 10/25/2012  . Cardiomyopathy, ischemic: EF 35-40% by echo post-MI 10/20/12 10/22/2012  . STEMI 10/19/12- LAD DES, and CFX DES 10/24/12 10/20/2012  . HTN (hypertension) 10/20/2012   AshGorden Harmsortorici, PT, DPT #[id number] [phone],Marcelis Wissner 09/27/2015, 4:39 PM  [facility] [address] [place]one: [phone]:  [phone]: Susan [name]  MRN: [id number] Date of Birth: 03/29/1934

## 2015-09-29 ENCOUNTER — Ambulatory Visit: Payer: Medicare Other

## 2015-09-29 DIAGNOSIS — R2681 Unsteadiness on feet: Secondary | ICD-10-CM | POA: Diagnosis not present

## 2015-09-29 DIAGNOSIS — M6281 Muscle weakness (generalized): Secondary | ICD-10-CM

## 2015-09-29 NOTE — Therapy (Signed)
[place] MAIN [facility] SERVICES [address] [place], [place], [address] Phone: [phone]   Fax:  [phone]  Physical Therapy Treatment  Patient Details  Name: XYLAH EARLY MRN: [id number] Date of Birth: 09/10/1934 Referring Provider: Jeb Levering  Encounter Date: 09/29/2015      PT End of Session - 09/29/15 1436    Visit Number 10   Number of Visits 17   Date for PT Re-Evaluation 10/20/15   Authorization Type 3/10   PT Start Time 1430   PT Stop Time 1515   PT Time Calculation (min) 45 min   Equipment Utilized During Treatment Gait belt   Activity Tolerance Patient tolerated treatment well   Behavior During Therapy [facility] for tasks assessed/performed      Past Medical History  Diagnosis Date  . Hypertension   . Coronary artery disease   . Hyperlipidemia   . NSVT (nonsustained ventricular tachycardia) ([place])   . Bradycardia   . Uterine cancer ([place])     serrous  . Myocardial infarction ([place])   . Heart murmur     Past Surgical History  Procedure Laterality Date  . Left heart catheterization with coronary angiogram  10/19/2012    Procedure: LEFT HEART CATHETERIZATION WITH CORONARY ANGIOGRAM;  Surgeon: Troy Sine, MD;  Location: [facility] CATH LAB;  Service: Cardiovascular;;  . Percutaneous coronary stent intervention (pci-s)  10/19/2012    Procedure: PERCUTANEOUS CORONARY STENT INTERVENTION (PCI-S);  Surgeon: Troy Sine, MD;  Location: [facility] CATH LAB;  Service: Cardiovascular;;  . Percutaneous coronary stent intervention (pci-s) N/A 10/23/2012    Procedure: PERCUTANEOUS CORONARY STENT INTERVENTION (PCI-S);  Surgeon: Troy Sine, MD;  Location: [facility] CATH LAB;  Service: Cardiovascular;  Laterality: N/A;  . Abdominal hysterectomy    . Peripheral vascular catheterization N/A 05/05/2015    Procedure: Glori Luis Cath Insertion;  Surgeon: Algernon Huxley, MD;  Location: [place] CV LAB;  Service: Cardiovascular;  Laterality: N/A;    There were no  vitals filed for this visit.      Subjective Assessment - 09/29/15 1433    Subjective pt reports feeling tired today.   Pertinent History chronic LBP, uterine CA, hx of MI   Patient Stated Goals Improve balance, walk without cane    Currently in Pain? Yes   Pain Score 5    Pain Location Back   Pain Orientation Right;Left   Pain Descriptors / Indicators Aching   Pain Onset 1 to 4 weeks ago     Therex: Nustep L2 x 5 min (unbilled)    NMR: figure 8 around cones with ball toss and bounce x 5;  backwards walking with ball toss and bounce, 2f x 2;  fwd/retro walking on command with ball toss and bounce, 563f2 bil side stepping with ball bounce, 18070f each; bil side stepping with ball toss and bounce 180f8feach; cues to maintain stepping during toss/bounce, CGA - min A for unsteadiness Pt required CGA throughout all exercises.        PT Education - 09/29/15 1435    Education provided Yes   Education Details exercise technique, weight shifting   Person(s) Educated Patient   Methods Explanation   Comprehension Verbalized understanding             PT Long Term Goals - 09/22/15 1516    PT LONG TERM GOAL #1   Title pt will improve Berg balance score by 6pts to reduce fall risk    Time 8  Period Weeks   Status Achieved   PT LONG TERM GOAL #2   Title pt will improve 76mwalk speed to 1.2 m/s to improve her community mobility    Baseline 0.951m with no AD   Time 8   Period Weeks   Status Partially Met   PT LONG TERM GOAL #3   Title pt will improve 5xsit to stand time to <14.15m37mdemonstrating improved LE strength   Time 8   Period Weeks   Status Achieved   PT LONG TERM GOAL #4   Title pt will improve ankle strength to 4/5 to improve her static standing balance in the shower.    Time 8   Period Weeks   Status On-going   PT LONG TERM GOAL #5   Title pt will be able to demonstrate proper lifting technique 3/5 times in order to demonstrate improved balance and  decrease risk fo falls.    Time 4   Period Weeks   Status New               Plan - 09/29/15 1624    Clinical Impression Statement worked with pt on dynamic gait/multitasking again during session today. pt still unsteady, especially when making turns and sidestepping while bouncing and tossing ball. pt continues to demonstrate fatigue and needs rest breaks between activities. pt not returning to therapy until beginning of August for personal reasons.    Rehab Potential Good   Clinical Impairments Affecting Rehab Potential neuropathy, chronic LBP, hx of CA, cardiac history, fatigue following CA treatment    PT Frequency 2x / week   PT Duration 8 weeks   PT Treatment/Interventions Aquatic Therapy;Gait training;Stair training;Therapeutic activities;Therapeutic exercise;Balance training;Neuromuscular re-education;Patient/family education;Cryotherapy;Vestibular   PT Next Visit Plan increase resistance/balance as tolerated   Consulted and Agree with Plan of Care Patient      Patient will benefit from skilled therapeutic intervention in order to improve the following deficits and impairments:  Decreased strength, Abnormal gait, Impaired flexibility, Decreased activity tolerance, Decreased balance, Decreased range of motion, Impaired sensation, Pain, Decreased endurance  Visit Diagnosis: Unsteadiness on feet  Muscle weakness (generalized)     Problem List Patient Active Problem List   Diagnosis Date Noted  . Absence of bladder continence 07/21/2015  . Postoperative state 04/08/2015  . Carcinoma of endometrium (HCCK[place]1/08/2015  . Endometrial cancer (HCCDubuque2/28/2016  . Gastro-esophageal reflux disease without esophagitis 02/15/2015  . Osteopenia 08/17/2014  . HLD (hyperlipidemia) 02/13/2014  . Kidney stones 10/27/2013  . CA skin, basal cell 06/30/2013  . Narrowing of intervertebral disc space 06/30/2013  . Accumulation of fluid in tissues 06/30/2013  . Benign essential HTN  06/30/2013  . Calculus of kidney 06/30/2013  . Lumbar canal stenosis 06/30/2013  . Arthritis, degenerative 06/30/2013  . Neuralgia neuritis, sciatic nerve 06/30/2013  . Edema 06/30/2013  . Cardiac murmur 06/30/2013  . Hematuria 12/19/2012  . CAD (coronary artery disease)- elective CFX DES 10/24/12 for recurrent angina 10/25/2012  . Dyslipidemia 10/25/2012  . NSVT (nonsustained ventricular tachycardia) ([place]CC[place]8/03/2012  . Bradycardia- tolerating low dose beta blocker 10/25/2012  . Cardiomyopathy, ischemic: EF 35-40% by echo post-MI 10/20/12 10/22/2012  . STEMI 10/19/12- LAD DES, and CFX DES 10/24/12 10/20/2012  . HTN (hypertension) 10/20/2012   BroGeraldine SolarPT This entire session was performed under direct supervision and direction of a licensed therapist/therapist assistant . I have personally read, edited and approve of the note as written. AshGorden Harmsortorici, PT, DPT #[id number],Ashley 09/30/2015, 10:41  [place] MAIN [facility] SERVICES [address] [place], [place], [address] Phone: [phone]   Fax:  [phone]  Name: MAZI SCHUFF MRN: [id number] Date of Birth: [birth year]

## 2015-10-05 ENCOUNTER — Ambulatory Visit: Payer: Medicare Other

## 2015-10-06 ENCOUNTER — Ambulatory Visit
Admission: RE | Admit: 2015-10-06 | Discharge: 2015-10-06 | Disposition: A | Discharge status: Home / Self Care | Payer: Medicare Other | Source: Ambulatory Visit | Attending: Radiation Oncology | Admitting: Radiation Oncology

## 2015-10-06 DIAGNOSIS — C541 Malignant neoplasm of endometrium: Secondary | ICD-10-CM | POA: Diagnosis not present

## 2015-10-07 ENCOUNTER — Ambulatory Visit: Payer: Medicare Other

## 2015-10-08 DIAGNOSIS — C541 Malignant neoplasm of endometrium: Secondary | ICD-10-CM | POA: Diagnosis not present

## 2015-10-12 ENCOUNTER — Ambulatory Visit
Admission: RE | Admit: 2015-10-12 | Discharge: 2015-10-12 | Disposition: A | Discharge status: Home / Self Care | Payer: Medicare Other | Source: Ambulatory Visit | Attending: Radiation Oncology | Admitting: Radiation Oncology

## 2015-10-12 DIAGNOSIS — Z510 Encounter for antineoplastic radiation therapy: Secondary | ICD-10-CM | POA: Diagnosis not present

## 2015-10-12 DIAGNOSIS — C541 Malignant neoplasm of endometrium: Secondary | ICD-10-CM | POA: Diagnosis not present

## 2015-10-14 ENCOUNTER — Ambulatory Visit
Admission: RE | Admit: 2015-10-14 | Discharge: 2015-10-14 | Disposition: A | Discharge status: Home / Self Care | Payer: Medicare Other | Source: Ambulatory Visit | Attending: Radiation Oncology | Admitting: Radiation Oncology

## 2015-10-14 ENCOUNTER — Ambulatory Visit: Payer: Medicare Other

## 2015-10-14 DIAGNOSIS — C541 Malignant neoplasm of endometrium: Secondary | ICD-10-CM | POA: Diagnosis not present

## 2015-10-18 ENCOUNTER — Ambulatory Visit
Admission: RE | Admit: 2015-10-18 | Discharge: 2015-10-18 | Disposition: A | Discharge status: Home / Self Care | Payer: Medicare Other | Source: Ambulatory Visit | Attending: Radiation Oncology | Admitting: Radiation Oncology

## 2015-10-18 ENCOUNTER — Ambulatory Visit: Payer: Medicare Other

## 2015-10-18 DIAGNOSIS — C541 Malignant neoplasm of endometrium: Secondary | ICD-10-CM | POA: Diagnosis not present

## 2015-10-19 ENCOUNTER — Ambulatory Visit: Payer: Medicare Other

## 2015-10-20 ENCOUNTER — Ambulatory Visit: Payer: Medicare Other | Admitting: Radiation Oncology

## 2015-10-20 ENCOUNTER — Inpatient Hospital Stay: Payer: Medicare Other | Attending: Obstetrics and Gynecology | Admitting: Obstetrics and Gynecology

## 2015-10-20 VITALS — BP 125/76 | HR 60 | Temp 98.0°F | Ht 64.0 in | Wt 191.8 lb

## 2015-10-20 DIAGNOSIS — R5383 Other fatigue: Secondary | ICD-10-CM | POA: Insufficient documentation

## 2015-10-20 DIAGNOSIS — Z79899 Other long term (current) drug therapy: Secondary | ICD-10-CM | POA: Insufficient documentation

## 2015-10-20 DIAGNOSIS — Z880 Allergy status to penicillin: Secondary | ICD-10-CM

## 2015-10-20 DIAGNOSIS — I2510 Atherosclerotic heart disease of native coronary artery without angina pectoris: Secondary | ICD-10-CM | POA: Diagnosis not present

## 2015-10-20 DIAGNOSIS — C541 Malignant neoplasm of endometrium: Secondary | ICD-10-CM | POA: Diagnosis not present

## 2015-10-20 DIAGNOSIS — Z7982 Long term (current) use of aspirin: Secondary | ICD-10-CM | POA: Insufficient documentation

## 2015-10-20 DIAGNOSIS — G620 Drug-induced polyneuropathy: Secondary | ICD-10-CM | POA: Diagnosis not present

## 2015-10-20 DIAGNOSIS — R197 Diarrhea, unspecified: Secondary | ICD-10-CM | POA: Diagnosis not present

## 2015-10-20 DIAGNOSIS — I252 Old myocardial infarction: Secondary | ICD-10-CM | POA: Insufficient documentation

## 2015-10-20 DIAGNOSIS — C775 Secondary and unspecified malignant neoplasm of intrapelvic lymph nodes: Secondary | ICD-10-CM | POA: Insufficient documentation

## 2015-10-20 DIAGNOSIS — E785 Hyperlipidemia, unspecified: Secondary | ICD-10-CM | POA: Insufficient documentation

## 2015-10-20 DIAGNOSIS — Z452 Encounter for adjustment and management of vascular access device: Secondary | ICD-10-CM | POA: Diagnosis not present

## 2015-10-20 DIAGNOSIS — R270 Ataxia, unspecified: Secondary | ICD-10-CM | POA: Insufficient documentation

## 2015-10-20 DIAGNOSIS — Z923 Personal history of irradiation: Secondary | ICD-10-CM | POA: Insufficient documentation

## 2015-10-20 DIAGNOSIS — Z9221 Personal history of antineoplastic chemotherapy: Secondary | ICD-10-CM | POA: Diagnosis not present

## 2015-10-20 DIAGNOSIS — T451X5S Adverse effect of antineoplastic and immunosuppressive drugs, sequela: Secondary | ICD-10-CM | POA: Diagnosis not present

## 2015-10-20 DIAGNOSIS — I10 Essential (primary) hypertension: Secondary | ICD-10-CM | POA: Insufficient documentation

## 2015-10-20 NOTE — Progress Notes (Signed)
Patient here for follow up. No complaints today. 

## 2015-10-20 NOTE — Progress Notes (Signed)
Gynecologic Oncology Visit   Referring Provider: Dr Joylene Igo  Chief Concern: stage IIIC1serous endometrial cancer  Subjective:  Susan [name] is a 81 y.o. P38 female who is seen in consultation from Dr. Newman Nip for serous uterine cancer.  She has some more frequent soft BMs with radiation.  Just finished last vaginal brachytherapy.  Still has significant neuropathy in feet requiring her to use a cane.  She continues to teach water aerobics though.  Is going to start PT again now that radiation is finished. Also has some fatigue. No more urinary incontinence.   Gynecologic Oncology  Susan [name] is a 81 y.o. 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  Please see complete details in prior notes.   Path report below shows stage IIIC1 disease.  Washings were negative, but she had 3 positive SLNs in pelvis.  An aortic SLN was negative.     A. Lymph node, sentinel, right external iliac, excision: Minute focus of metastatic carcinoma involving one lymph node (1/1). See note. Size of metastasis: Less than 0.1 mm. Negative for extranodal invasion.  Note: A cytokeratin AE1/3 highlights the metastatic focus.  B. Lymph node, sentinel, right external iliac #2, excision: Metastatic carcinoma involving one lymph node (1/1). See note. Size of metastasis: 0.5 mm. Negative for extranodal invasion. Note: A cytokeratin AE1/3 highlights the metastatic focus.  C. Lymph node, sentinel, right aortic, excision: One benign lymph node (0/1). Note: A cytokeratin AE1/3 immunostain is negative.  D. Lymph node, sentinel, left external iliac, excision:  Metastatic adenocarcinoma involving one lymph node (1/1).   Size of metastasis: 1 cm. Negative for extranodal invasion. Note: A cytokeratin AE1/3 highlights the metastatic focus.  E. Uterus, bilateral ovaries and fallopian tubes,  hysterectomy and bilateral salpingo-oophorectomy: Mixed endometrial carcinoma composed of serous carcinoma and endometrioid adenocarcinoma.  Tumor size: 5.1 cm. The tumor invades 2 mm into a 14 mm thick myometrium. Negative for cervical involvement. Lymphovascular invasion is present.  Recommendation to proceed with with carboplatinum and paclitaxel three-four cycles followed by radiation therapy followed by additional 2-3 cycles of chemotherapy. She started on chemotherapy on 05/12/2015 and received 2nd cycle on 06/02/2015. She developed significant neuropathy impairing her quality of life with complaints of sensory ataxia, frequent falls, and needs to walk with the help of walker. Chemotherapy was discontinued. Then received 4500 cGy pelvic radiation and 1200 cGy using high dose rate remote afterloading through vaginal cylinder completed 7/17.  Problem List: Patient Active Problem List   Diagnosis Date Noted  . Absence of bladder continence 07/21/2015  . Postoperative state 04/08/2015  . Carcinoma of endometrium ([place]) 04/02/2015  . Endometrial cancer ([place]) 03/24/2015  . Gastro-esophageal reflux disease without esophagitis 02/15/2015  . Osteopenia 08/17/2014  . HLD (hyperlipidemia) 02/13/2014  . Kidney stones 10/27/2013  . CA skin, basal cell 06/30/2013  . Narrowing of intervertebral disc space 06/30/2013  . Accumulation of fluid in tissues 06/30/2013  . Benign essential HTN 06/30/2013  . Calculus of kidney 06/30/2013  . Lumbar canal stenosis 06/30/2013  . Arthritis, degenerative 06/30/2013  . Neuralgia neuritis, sciatic nerve 06/30/2013  . Edema 06/30/2013  . Cardiac murmur 06/30/2013  . Hematuria 12/19/2012  . CAD (coronary artery disease)- elective CFX DES 10/24/12 for recurrent angina 10/25/2012  . Dyslipidemia 10/25/2012  . NSVT (nonsustained ventricular tachycardia) ([place]) 10/25/2012  . Bradycardia- tolerating low dose beta blocker 10/25/2012  . Cardiomyopathy, ischemic: EF 35-40%  by  echo post-MI 10/20/12 10/22/2012  . STEMI 10/19/12- LAD DES, and CFX DES 10/24/12 10/20/2012  . HTN (hypertension) 10/20/2012    Past Medical History: Past Medical History:  Diagnosis Date  . Bradycardia   . Coronary artery disease   . Heart murmur   . Hyperlipidemia   . Hypertension   . Myocardial infarction ([place])   . NSVT (nonsustained ventricular tachycardia) ([place])   . Uterine cancer ([place])    serrous    Past Surgical History: Past Surgical History:  Procedure Laterality Date  . ABDOMINAL HYSTERECTOMY    . LEFT HEART CATHETERIZATION WITH CORONARY ANGIOGRAM  10/19/2012   Procedure: LEFT HEART CATHETERIZATION WITH CORONARY ANGIOGRAM;  Surgeon: Susan Sine, MD;  Location: [facility] CATH LAB;  Service: Cardiovascular;;  . PERCUTANEOUS CORONARY STENT INTERVENTION (PCI-S)  10/19/2012   Procedure: PERCUTANEOUS CORONARY STENT INTERVENTION (PCI-S);  Surgeon: Susan Sine, MD;  Location: [facility] CATH LAB;  Service: Cardiovascular;;  . PERCUTANEOUS CORONARY STENT INTERVENTION (PCI-S) N/A 10/23/2012   Procedure: PERCUTANEOUS CORONARY STENT INTERVENTION (PCI-S);  Surgeon: Susan Sine, MD;  Location: [facility] CATH LAB;  Service: Cardiovascular;  Laterality: N/A;  . PERIPHERAL VASCULAR CATHETERIZATION N/A 05/05/2015   Procedure: Susan [name] Cath Insertion;  Surgeon: Susan Huxley, MD;  Location: [place] CV LAB;  Service: Cardiovascular;  Laterality: N/A;    Family History: Family History  Problem Relation Age of Onset  . Heart disease Mother   . Breast cancer Mother   . Heart disease Father   . Diabetes Father   . Diabetes Brother   . Stroke Brother     Social History: Social History   Social History  . Marital status: Married    Spouse name: N/A  . Number of children: N/A  . Years of education: N/A   Occupational History  . Not on file.   Social History Main Topics  . Smoking status: Never Smoker  . Smokeless tobacco: Never Used  . Alcohol use 0.6 oz/week    1 Glasses of wine per week   . Drug use: No  . Sexual activity: Yes   Other Topics Concern  . Not on file   Social History Narrative  . No narrative on file    Allergies: Allergies  Allergen Reactions  . Penicillins Rash    Current Medications: Current Outpatient Prescriptions  Medication Sig Dispense Refill  . acetaminophen (TYLENOL) 500 MG tablet Take 500 mg by mouth every 6 (six) hours as needed.    [name] aspirin EC 81 MG tablet Take 81 mg by mouth daily.    [name] atorvastatin (LIPITOR) 80 MG tablet TAKE 1 TABLET BY MOUTH AT 6PM 30 tablet 6  . dexamethasone (DECADRON) 4 MG tablet TAKE 1 TABLET BY MOUTH QD EXCEPT TWICE A DAY BEFORE EACH CHEMO (Patient not taking: Reported on 09/20/2015) 20 tablet 1  . ezetimibe (ZETIA) 10 MG tablet Take 1 tablet (10 mg total) by mouth daily. 30 tablet 11  . furosemide (LASIX) 20 MG tablet TAKE 1 TABLET BY MOUTH DAILY. 90 tablet 2  . gabapentin (NEURONTIN) 100 MG capsule Take 2 capsules (200 mg total) by mouth 2 (two) times daily. (Patient taking differently: Take 100 mg by mouth 2 (two) times daily. ) 360 capsule 0  . isosorbide mononitrate (IMDUR) 30 MG 24 hr tablet Take 1 tablet (30 mg total) by mouth daily. 30 tablet 11  . lidocaine-prilocaine (EMLA) cream Apply to affected area once 30 g 3  . loperamide (IMODIUM A-D) 2 MG tablet Take  2 mg by mouth 4 (four) times daily as needed for diarrhea or loose stools.    [name] losartan (COZAAR) 50 MG tablet TAKE 1 TABLET (50 MG TOTAL) BY MOUTH DAILY. 90 tablet 0  . Multiple Vitamin (MULTIVITAMIN WITH MINERALS) TABS Take 1 tablet by mouth daily.    . nitroGLYCERIN (NITROSTAT) 0.4 MG SL tablet Place 1 tablet (0.4 mg total) under the tongue every 5 (five) minutes x 3 doses as needed for chest pain. 25 tablet 3  . omeprazole (PRILOSEC) 20 MG capsule Take 20 mg by mouth daily.    . ondansetron (ZOFRAN) 8 MG tablet Take 1 tablet (8 mg total) by mouth 2 (two) times daily as needed for refractory nausea / vomiting. Start on day 3 after chemo. 30 tablet  1  . oxyCODONE (OXY IR/ROXICODONE) 5 MG immediate release tablet Take 5 mg by mouth every 4 (four) hours as needed. for pain  0  . ticagrelor (BRILINTA) 60 MG TABS tablet Take 1 tablet (60 mg total) by mouth 2 (two) times daily. Please keep your upcoming appointment (11/28/15) for refills. 60 tablet 5  . Vitamin D, Cholecalciferol, 1000 UNITS CAPS Take 1 capsule by mouth daily.     No current facility-administered medications for this visit.     Review of Systems General: fatigue Pulmonary: negative for, dyspnea,  productive cough Gastrointestinal: negative for pain, distension/bloating, decreased appetite, n/v,constipation, diarrhea Genitourinary/Sexual: none Ob/Gyn: negative for pain or bleeding Musculoskeletal: back pain and leg swelling Hematology: No coagulation disorder, negative for, easy bruising, bleeding Neurologic/Psych: peripheral neuropathy  Objective:  Physical Examination:  BP 125/76 (BP Location: Left Arm, Patient Position: Sitting)   Pulse 60   Temp 98 F (36.7 C) (Tympanic)   Ht 5\' 4"  (1.626 m)   Wt 191 lb 12.8 oz (87 kg)   BMI 32.92 kg/m    ECOG Performance Status: 0 - Asymptomatic  General appearance: alert, cooperative and appears stated age HEENT:PERRLA, neck supple with midline trachea and thyroid without masses Lymph node survey: non-palpable, axillary, inguinal, supraclavicular Cardiovascular: regular rate and rhythm  Respiratory: normal air entry, lungs clear to auscultation and no rales Abdomen: well healed incisions, nontender Back: inspection of back is normal Extremities: extremities normal, atraumatic, no cyanosis or edema Neurological exam reveals alert, oriented, normal speech, she is walking with a cane  Pelvic: exam chaperoned by nurse;  Vulva: normal appearing vulva with no masses, tenderness or lesions; Vagina: normal vagina; no lesions; Adnexa: no masses; Uterus/Cervix:surgically absent; Rectal: no masses; sphincter tone moderate 3/5     Assessment:  JAIANNAH TEUBNER is a 81 y.o. 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 NED today.  Ischemic heart disease with cornonary stents on Brilinta, but good functional status.  Problem List Items Addressed This Visit      Genitourinary   Carcinoma of endometrium ([place]) - Primary    Other Visit Diagnoses   None.    No evidence of recurrent disease at completion of treatment.  Explained to patient that we do not do routine surveillance CT scans.  Rather we will continue to follow closely with follow up exam in 3-4 months and will order imaging if she has concerning symptoms or exam findings.  She will continue follow up with Drs.Corcoran and Chrystal at the [facility] cancer center.  Will resume PT in view of difficulty ambulating secondary to chemotherapy induced peripheral neuropathy.  Mellody Drown, MD

## 2015-10-21 ENCOUNTER — Inpatient Hospital Stay: Payer: Medicare Other

## 2015-10-21 ENCOUNTER — Inpatient Hospital Stay (HOSPITAL_BASED_OUTPATIENT_CLINIC_OR_DEPARTMENT_OTHER): Payer: Medicare Other | Admitting: Hematology and Oncology

## 2015-10-21 ENCOUNTER — Inpatient Hospital Stay (HOSPITAL_BASED_OUTPATIENT_CLINIC_OR_DEPARTMENT_OTHER): Payer: Medicare Other

## 2015-10-21 VITALS — BP 108/66 | HR 56 | Temp 97.8°F | Resp 18 | Wt 192.5 lb

## 2015-10-21 DIAGNOSIS — G629 Polyneuropathy, unspecified: Secondary | ICD-10-CM

## 2015-10-21 DIAGNOSIS — R5383 Other fatigue: Secondary | ICD-10-CM

## 2015-10-21 DIAGNOSIS — C775 Secondary and unspecified malignant neoplasm of intrapelvic lymph nodes: Secondary | ICD-10-CM | POA: Diagnosis not present

## 2015-10-21 DIAGNOSIS — Z923 Personal history of irradiation: Secondary | ICD-10-CM | POA: Diagnosis not present

## 2015-10-21 DIAGNOSIS — C541 Malignant neoplasm of endometrium: Secondary | ICD-10-CM | POA: Diagnosis not present

## 2015-10-21 DIAGNOSIS — T451X5S Adverse effect of antineoplastic and immunosuppressive drugs, sequela: Secondary | ICD-10-CM

## 2015-10-21 DIAGNOSIS — G620 Drug-induced polyneuropathy: Secondary | ICD-10-CM

## 2015-10-21 DIAGNOSIS — Z95828 Presence of other vascular implants and grafts: Secondary | ICD-10-CM

## 2015-10-21 DIAGNOSIS — I10 Essential (primary) hypertension: Secondary | ICD-10-CM

## 2015-10-21 DIAGNOSIS — I252 Old myocardial infarction: Secondary | ICD-10-CM

## 2015-10-21 DIAGNOSIS — R197 Diarrhea, unspecified: Secondary | ICD-10-CM

## 2015-10-21 DIAGNOSIS — I2510 Atherosclerotic heart disease of native coronary artery without angina pectoris: Secondary | ICD-10-CM

## 2015-10-21 DIAGNOSIS — Z79899 Other long term (current) drug therapy: Secondary | ICD-10-CM

## 2015-10-21 DIAGNOSIS — Z9221 Personal history of antineoplastic chemotherapy: Secondary | ICD-10-CM

## 2015-10-21 DIAGNOSIS — Z7982 Long term (current) use of aspirin: Secondary | ICD-10-CM

## 2015-10-21 DIAGNOSIS — E785 Hyperlipidemia, unspecified: Secondary | ICD-10-CM

## 2015-10-21 DIAGNOSIS — R270 Ataxia, unspecified: Secondary | ICD-10-CM

## 2015-10-21 LAB — COMPREHENSIVE METABOLIC PANEL
ALBUMIN: 4 g/dL (ref 3.5–5.0)
ALK PHOS: 105 U/L (ref 38–126)
ALT: 17 U/L (ref 14–54)
ANION GAP: 6 (ref 5–15)
AST: 33 U/L (ref 15–41)
BILIRUBIN TOTAL: 0.7 mg/dL (ref 0.3–1.2)
BUN: 25 mg/dL — AB (ref 6–20)
CALCIUM: 9.4 mg/dL (ref 8.9–10.3)
CO2: 27 mmol/L (ref 22–32)
CREATININE: 0.84 mg/dL (ref 0.44–1.00)
Chloride: 106 mmol/L (ref 101–111)
GFR calc Af Amer: >60 mL/min (ref >60)
GFR calc non Af Amer: >60 mL/min (ref >60)
GLUCOSE: 142 mg/dL — AB (ref 65–99)
Potassium: 3.4 mmol/L — ABNORMAL LOW (ref 3.5–5.1)
Sodium: 139 mmol/L (ref 135–145)
TOTAL PROTEIN: 6.6 g/dL (ref 6.5–8.1)

## 2015-10-21 LAB — CBC WITH DIFFERENTIAL/PLATELET
BASOS PCT: 0 %
Basophils Absolute: 0 10*3/uL (ref 0–0.1)
EOS ABS: 0.2 10*3/uL (ref 0–0.7)
Eosinophils Relative: 3 %
HCT: 33.5 % — ABNORMAL LOW (ref 35.0–47.0)
HEMOGLOBIN: 11.6 g/dL — AB (ref 12.0–16.0)
Lymphocytes Relative: 15 %
Lymphs Abs: 0.9 10*3/uL — ABNORMAL LOW (ref 1.0–3.6)
MCH: 31.2 pg (ref 26.0–34.0)
MCHC: 34.8 g/dL (ref 32.0–36.0)
MCV: 89.8 fL (ref 80.0–100.0)
Monocytes Absolute: 0.5 10*3/uL (ref 0.2–0.9)
Monocytes Relative: 9 %
NEUTROS PCT: 73 %
Neutro Abs: 4.3 10*3/uL (ref 1.4–6.5)
Platelets: 195 10*3/uL (ref 150–440)
RBC: 3.73 MIL/uL — AB (ref 3.80–5.20)
RDW: 13.6 % (ref 11.5–14.5)
WBC: 5.9 10*3/uL (ref 3.6–11.0)

## 2015-10-21 MED ORDER — HEPARIN SOD (PORK) LOCK FLUSH 100 UNIT/ML IV SOLN
500.0000 [IU] | Freq: Once | INTRAVENOUS | Status: AC
  Administered 2015-10-21: 500 [IU] via INTRAVENOUS

## 2015-10-21 MED ORDER — SODIUM CHLORIDE 0.9% FLUSH
10.0000 mL | INTRAVENOUS | Status: DC | PRN
  Administered 2015-10-21: 10 mL via INTRAVENOUS
  Filled 2015-10-21: qty 10

## 2015-10-21 NOTE — Progress Notes (Signed)
[facility] day:  10/21/15  Chief Complaint: Susan [name] is a 81 y.o. female with stage IIIC uterine cancer who is seen for reassessment.  HPI:  The patient presented with vaginal bleeding.  She was seen by Dr.Bronstein then referred to Dr. Fransisca Connors.  She underwent TLH/BSO and staging on 04/08/2015 at [facility].  Pathology revealed stage IIIC1 disease.  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  The tumor invaded 2 mm into a 14 mm thick myometrium.  There was no cervical involvement.   There was lymphovascular invasion.  Washings were negative.  There were 3 positive lymph nodes in the pelvis (left external iliac node: 1 cm focus without extranodal extension; less than 0.1 mm focus in 1 external right iliac node and a 0.5 mm focus in another right external iliac node).    Initial plan was  3-4 cycles of carboplatin and Taxol followed by radiation therapy then an additional 2-3 cycles of chemotherapy. She began chemotherapy on 05/12/2015.  She received cycle #2 on 06/02/2015.   She developed significant neuropathy impairing her quality of life (sensory ataxia, frequent falls, and the need to walk with a walker). Chemotherapy was discontinued.   She then received 4500 cGy pelvic radiation and 1200 cGy using high dose rate remote afterloading through vaginal cylinder completed 10/18/2015.  She was started on Neurontin for neuropathy.  She states that the higher doses made her "fuzzy headed".  Symptomatically, she can't feel her feet.  The tips of her fingers are numb.  She had some diarrhea this morning.  She is planning on physical therapy (Monday and Wednesday) beginning in 10/2015.  She is in water aerobics 2 times a week.   Past Medical History:  Diagnosis Date  . Bradycardia   . Coronary artery disease   . Heart murmur   . Hyperlipidemia   . Hypertension   . Myocardial infarction  ([place])   . NSVT (nonsustained ventricular tachycardia) ([place])   . Uterine cancer ([place])    serrous    Past Surgical History:  Procedure Laterality Date  . ABDOMINAL HYSTERECTOMY    . LEFT HEART CATHETERIZATION WITH CORONARY ANGIOGRAM  10/19/2012   Procedure: LEFT HEART CATHETERIZATION WITH CORONARY ANGIOGRAM;  Surgeon: Troy Sine, MD;  Location: [facility] CATH LAB;  Service: Cardiovascular;;  . PERCUTANEOUS CORONARY STENT INTERVENTION (PCI-S)  10/19/2012   Procedure: PERCUTANEOUS CORONARY STENT INTERVENTION (PCI-S);  Surgeon: Troy Sine, MD;  Location: [facility] CATH LAB;  Service: Cardiovascular;;  . PERCUTANEOUS CORONARY STENT INTERVENTION (PCI-S) N/A 10/23/2012   Procedure: PERCUTANEOUS CORONARY STENT INTERVENTION (PCI-S);  Surgeon: Troy Sine, MD;  Location: [facility] CATH LAB;  Service: Cardiovascular;  Laterality: N/A;  . PERIPHERAL VASCULAR CATHETERIZATION N/A 05/05/2015   Procedure: Glori Luis Cath Insertion;  Surgeon: Algernon Huxley, MD;  Location: [place] CV LAB;  Service: Cardiovascular;  Laterality: N/A;    Family History  Problem Relation Age of Onset  . Heart disease Mother   . Breast cancer Mother   . Heart disease Father   . Diabetes Father   . Diabetes Brother   . Stroke Brother     Social History:  reports that she has never smoked. She has never used smokeless tobacco. She reports that she drinks about 0.6 oz of alcohol per week . She reports that she does not use drugs.  She is originally from [place].  She lives in [place].  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  [name], [name], today.  Allergies:  Allergies  Allergen Reactions  . Penicillins Rash    Current Medications: Current Outpatient Prescriptions  Medication Sig Dispense Refill  . aspirin EC 81 MG tablet Take 81 mg by mouth daily.    [name] atorvastatin (LIPITOR) 80 MG tablet TAKE 1 TABLET BY MOUTH AT 6PM 30 tablet 6  . ezetimibe (ZETIA) 10 MG tablet Take  1 tablet (10 mg total) by mouth daily. 30 tablet 11  . furosemide (LASIX) 20 MG tablet TAKE 1 TABLET BY MOUTH DAILY. 90 tablet 2  . gabapentin (NEURONTIN) 100 MG capsule Take 2 capsules (200 mg total) by mouth 2 (two) times daily. (Patient taking differently: Take 100 mg by mouth 2 (two) times daily. ) 360 capsule 0  . isosorbide mononitrate (IMDUR) 30 MG 24 hr tablet Take 1 tablet (30 mg total) by mouth daily. 30 tablet 11  . lidocaine-prilocaine (EMLA) cream Apply to affected area once 30 g 3  . loperamide (IMODIUM A-D) 2 MG tablet Take 2 mg by mouth 4 (four) times daily as needed for diarrhea or loose stools.    [name] losartan (COZAAR) 50 MG tablet TAKE 1 TABLET (50 MG TOTAL) BY MOUTH DAILY. 90 tablet 0  . Multiple Vitamin (MULTIVITAMIN WITH MINERALS) TABS Take 1 tablet by mouth daily.    . nitroGLYCERIN (NITROSTAT) 0.4 MG SL tablet Place 1 tablet (0.4 mg total) under the tongue every 5 (five) minutes x 3 doses as needed for chest pain. 25 tablet 3  . omeprazole (PRILOSEC) 20 MG capsule Take 20 mg by mouth daily.    [name] pyridOXINE (VITAMIN B-6) 100 MG tablet Take 100 mg by mouth 2 (two) times daily.    . ticagrelor (BRILINTA) 60 MG TABS tablet Take 1 tablet (60 mg total) by mouth 2 (two) times daily. Please keep your upcoming appointment (11/28/15) for refills. 60 tablet 5  . Vitamin D, Cholecalciferol, 1000 UNITS CAPS Take 1 capsule by mouth daily.     No current facility-administered medications for this visit.    Facility-Administered Medications Ordered in Other Visits  Medication Dose Route Frequency Provider Last Rate Last Dose  . sodium chloride flush (NS) 0.9 % injection 10 mL  10 mL Intravenous PRN Lequita Asal, MD   10 mL at 10/21/15 1347    Review of Systems:  GENERAL:  Feels "ok".  No fevers, sweats or weight loss. PERFORMANCE STATUS (ECOG):  2 HEENT:  No visual changes, runny nose, sore throat, mouth sores or tenderness. Lungs: No shortness of breath or cough.  No hemoptysis.   Sleep apnea.   Cardiac:  No chest pain, palpitations, orthopnea, or PND. GI:  Diarrhea this AM.  No nausea, vomiting, constipation, melena or hematochezia. MI in 2014. GU:  No urgency, frequency, dysuria, or hematuria. Musculoskeletal:  No back pain.  No joint pain.  No muscle tenderness. Extremities:  No pain or swelling. Skin:  No rashes or skin changes. Neuro:  Neuropathy (see HPI).  No headache, focal weakness, balance or coordination issues. Endocrine:  No diabetes, thyroid issues, hot flashes or night sweats. Psych:  No mood changes, depression or anxiety. Pain:  No focal pain. Review of systems:  All other systems reviewed and found to be negative.  Physical Exam: Blood pressure 108/66, pulse (!) 56, temperature 97.8 F (36.6 C), temperature source Tympanic, resp. rate 18, weight 192 lb 7.4 oz (  87.3 kg). GENERAL:  Well developed, well nourished, woman sitting comfortably in the exam room in no acute distress.  She has a cane at her side. MENTAL STATUS:  Alert and oriented to person, place and time. HEAD:  Short blonde hair.  Normocephalic, atraumatic, face symmetric, no Cushingoid features. EYES:  Glasses.  Blue eyes.  Pupils equal round and reactive to light and accomodation.  No conjunctivitis or scleral icterus. ENT:  Oropharynx clear without lesion.  Tongue normal. Mucous membranes moist.  RESPIRATORY:  Clear to auscultation without rales, wheezes or rhonchi. CARDIOVASCULAR:  Regular rate and rhythm without murmur, rub or gallop. ABDOMEN:  Soft, non-tender, with active bowel sounds, and no hepatosplenomegaly.  No masses. SKIN:  No rashes, ulcers or lesions. EXTREMITIES: No edema, no skin discoloration or tenderness.  No palpable cords. LYMPH NODES: No palpable cervical, supraclavicular, axillary or inguinal adenopathy  NEUROLOGICAL: Ambulates slowly secondary to neuropathy. PSYCH:  Appropriate.   Infusion on 10/21/2015  Component Date Value Ref Range Status  . WBC  10/21/2015 5.9  3.6 - 11.0 K/uL Final  . RBC 10/21/2015 3.73* 3.80 - 5.20 MIL/uL Final  . Hemoglobin 10/21/2015 11.6* 12.0 - 16.0 g/dL Final  . HCT 10/21/2015 33.5* 35.0 - 47.0 % Final  . MCV 10/21/2015 89.8  80.0 - 100.0 fL Final  . MCH 10/21/2015 31.2  26.0 - 34.0 pg Final  . MCHC 10/21/2015 34.8  32.0 - 36.0 g/dL Final  . RDW 10/21/2015 13.6  11.5 - 14.5 % Final  . Platelets 10/21/2015 195  150 - 440 K/uL Final  . Neutrophils Relative % 10/21/2015 73  % Final  . Neutro Abs 10/21/2015 4.3  1.4 - 6.5 K/uL Final  . Lymphocytes Relative 10/21/2015 15  % Final  . Lymphs Abs 10/21/2015 0.9* 1.0 - 3.6 K/uL Final  . Monocytes Relative 10/21/2015 9  % Final  . Monocytes Absolute 10/21/2015 0.5  0.2 - 0.9 K/uL Final  . Eosinophils Relative 10/21/2015 3  % Final  . Eosinophils Absolute 10/21/2015 0.2  0 - 0.7 K/uL Final  . Basophils Relative 10/21/2015 0  % Final  . Basophils Absolute 10/21/2015 0.0  0 - 0.1 K/uL Final  . Sodium 10/21/2015 139  135 - 145 mmol/L Final  . Potassium 10/21/2015 3.4* 3.5 - 5.1 mmol/L Final  . Chloride 10/21/2015 106  101 - 111 mmol/L Final  . CO2 10/21/2015 27  22 - 32 mmol/L Final  . Glucose, Bld 10/21/2015 142* 65 - 99 mg/dL Final  . BUN 10/21/2015 25* 6 - 20 mg/dL Final  . Creatinine, Ser 10/21/2015 0.84  0.44 - 1.00 mg/dL Final  . Calcium 10/21/2015 9.4  8.9 - 10.3 mg/dL Final  . Total Protein 10/21/2015 6.6  6.5 - 8.1 g/dL Final  . Albumin 10/21/2015 4.0  3.5 - 5.0 g/dL Final  . AST 10/21/2015 33  15 - 41 U/L Final  . ALT 10/21/2015 17  14 - 54 U/L Final  . Alkaline Phosphatase 10/21/2015 105  38 - 126 U/L Final  . Total Bilirubin 10/21/2015 0.7  0.3 - 1.2 mg/dL Final  . GFR calc non Af Amer 10/21/2015 >60  >60 mL/min Final  . GFR calc Af Amer 10/21/2015 >60  >60 mL/min Final   Comment: (NOTE) The eGFR has been calculated using the CKD EPI equation. This calculation has not been validated in all clinical situations. eGFR's persistently <60 mL/min  signify possible Chronic Kidney Disease.   . Anion gap 10/21/2015 6  5 - 15  Final    Assessment:  Susan [name] is a 81 y.o. female with stage IIIC endometrial cancer s/p TLH/BSO and staging on 04/08/2015 at [facility].  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  Tumor invaded 2 mm into a 14 mm thick myometrium.  There was no cervical involvement.   There was lymphovascular invasion.  Washings were negative.  There were 3 positive lymph nodes in the pelvis (left external iliac node: 1 cm focus without extranodal extension; less than 0.1 mm focus in 1 external right iliac node and a 0.5 mm focus in another right external iliac node).  Pathologic stage was [id number].  She received 2 cycles of carboplatin and Taxol (05/12/2015 - 06/02/2015).  Chemotherapy was truncated secondary to a debilitating neuropathy.  She received 4500 cGy pelvic radiation and 1200 cGy using high dose rate remote afterloading through vaginal cylinder (completed 10/18/2015).  She was started on Neurontin for neuropathy.  She states that the higher doses made her "fuzzy headed".  Symptomatically, she can't feel her feet.  The tips of her fingers are numb.  She had some diarrhea this morning.   Plan: 1.  Discuss entire medical history, diagnosis and management of endometrial cancer.  Discuss no plan for surveillance imaging unless concerning symptoms, exam or labs. 2.  Labs today:  CBC with diff, CMP, CA125. 3.  Continue Neurontin (low dose secondary to side effects). 4.  Port flush every 6-8 weeks. 5.  RTC in 3 months for MD assess and labs (CBC with diff, CMP, CA125).   Lequita Asal, MD  10/21/2015, 2:50 PM

## 2015-10-21 NOTE — Progress Notes (Signed)
Patient is here today for follow up, former [facility] patient, no complaints did have a spell of diarrhea today but better now.

## 2015-10-21 NOTE — Progress Notes (Signed)
Survivorship Care Plan visit completed.  Treatment summary reviewed and given to patient.  ASCO answers booklet reviewed and given to patient.  CARE program and Cancer Transitions discussed along with other resources available through the cancer center.  Patient verbalized understanding.  

## 2015-10-22 LAB — CA 125: CA 125: 11.7 U/mL (ref 0.0–38.1)

## 2015-10-23 ENCOUNTER — Encounter: Payer: Self-pay | Admitting: Hematology and Oncology

## 2015-10-25 ENCOUNTER — Ambulatory Visit: Payer: Medicare Other

## 2015-10-25 ENCOUNTER — Other Ambulatory Visit: Payer: Self-pay | Admitting: *Deleted

## 2015-10-25 DIAGNOSIS — C541 Malignant neoplasm of endometrium: Secondary | ICD-10-CM

## 2015-10-25 DIAGNOSIS — R2681 Unsteadiness on feet: Secondary | ICD-10-CM | POA: Diagnosis not present

## 2015-10-25 DIAGNOSIS — M6281 Muscle weakness (generalized): Secondary | ICD-10-CM

## 2015-10-25 NOTE — Therapy (Signed)
[place] MAIN [facility] SERVICES [address] [place], [place], [address] Phone: [phone]   Fax:  [phone]  Physical Therapy Treatment/ Progress note  Patient Details  Name: Susan [name] MRN: [id number] Date of Birth: May 13, 1934 Referring Provider: Jeb Levering  Encounter Date: 10/25/2015      PT End of Session - 10/25/15 1437    Visit Number 11   Number of Visits 25   Date for PT Re-Evaluation 10/20/15   Authorization Type 1/10   PT Start Time 1430   PT Stop Time 1513   PT Time Calculation (min) 43 min   Equipment Utilized During Treatment Gait belt   Activity Tolerance Patient tolerated treatment well   Behavior During Therapy WFL for tasks assessed/performed      Past Medical History:  Diagnosis Date  . Bradycardia   . Coronary artery disease   . Heart murmur   . Hyperlipidemia   . Hypertension   . Myocardial infarction ([place])   . NSVT (nonsustained ventricular tachycardia) ([place])   . Uterine cancer ([place])    serrous    Past Surgical History:  Procedure Laterality Date  . ABDOMINAL HYSTERECTOMY    . LEFT HEART CATHETERIZATION WITH CORONARY ANGIOGRAM  10/19/2012   Procedure: LEFT HEART CATHETERIZATION WITH CORONARY ANGIOGRAM;  Surgeon: Troy Sine, MD;  Location: [facility] CATH LAB;  Service: Cardiovascular;;  . PERCUTANEOUS CORONARY STENT INTERVENTION (PCI-S)  10/19/2012   Procedure: PERCUTANEOUS CORONARY STENT INTERVENTION (PCI-S);  Surgeon: Troy Sine, MD;  Location: [facility] CATH LAB;  Service: Cardiovascular;;  . PERCUTANEOUS CORONARY STENT INTERVENTION (PCI-S) N/A 10/23/2012   Procedure: PERCUTANEOUS CORONARY STENT INTERVENTION (PCI-S);  Surgeon: Troy Sine, MD;  Location: [facility] CATH LAB;  Service: Cardiovascular;  Laterality: N/A;  . PERIPHERAL VASCULAR CATHETERIZATION N/A 05/05/2015   Procedure: Glori Luis Cath Insertion;  Surgeon: Algernon Huxley, MD;  Location: [facility] CV LAB;  Service: Cardiovascular;  Laterality: N/A;     There were no vitals filed for this visit.      Subjective Assessment - 10/25/15 1438    Subjective Pt reports having a fall last week when at home trying to clear a threshold and broke 2 toes on her L foot.    Pertinent History chronic LBP, uterine CA, hx of MI   Patient Stated Goals Improve balance, walk without cane    Currently in Pain? Yes   Pain Score 4    Pain Location Shoulder   Pain Orientation Left   Pain Descriptors / Indicators Sore   Pain Onset 1 to 4 weeks ago            [facility] PT Assessment - 10/25/15 0001      Standardized Balance Assessment   Five times sit to stand comments  14.15s   10 Meter Walk 0.78 m/s  no AD; 0.51ms wi SPC     Berg Balance Test   Sit to Stand Able to stand without using hands and stabilize independently   Standing Unsupported Able to stand safely 2 minutes   Sitting with Back Unsupported but Feet Supported on Floor or Stool Able to sit safely and securely 2 minutes   Stand to Sit Sits safely with minimal use of hands   Transfers Able to transfer safely, minor use of hands   Standing Unsupported with Eyes Closed Able to stand 10 seconds safely   Standing Ubsupported with Feet Together Able to place feet together independently and stand 1 minute safely   From  Standing, Reach Forward with Outstretched Arm Can reach forward >12 cm safely (5")   From Standing Position, Pick up Object from [place] to pick up shoe safely and easily   From Standing Position, Turn to Look Behind Over each Shoulder Looks behind one side only/other side shows less weight shift   Turn 360 Degrees Able to turn 360 degrees safely but slowly   Standing Unsupported, Alternately Place Feet on Step/Stool Able to stand independently and safely and complete 8 steps in 20 seconds   Standing Unsupported, One Foot in Front Able to take small step independently and hold 30 seconds   Standing on One Leg Able to lift leg independently and hold equal to or more than 3  seconds   Total Score 48       Therex: Nustep L3, LE only, x 5 min (unbilled) Outcome measures: BERG: 48/56; mod fall risk 10MWT: 0.23ms with SPC, 0.78 with no AD 5xSTS: 14.15 s  Resisted walking 4-way x10#, x 3 laps each; CGA for unsteadiness, increased difficulty with side stepping, L>R  NMR: Bil stance on airex pad with bil UE chops/lifts, circle in/out, and bil trunk rotation with 2.5# weight bar, 2x10 each; CGA - min A for balance; trunk rotation most difficult for pt       PT Education - 10/25/15 1511    Education provided Yes   Education Details POC, balance   Person(s) Educated Patient   Methods Explanation   Comprehension Verbalized understanding             PT Long Term Goals - 10/25/15 1605      PT LONG TERM GOAL #1   Title pt will improve Berg balance score by 6pts to reduce fall risk    Time 8   Period Weeks   Status Achieved     PT LONG TERM GOAL #2   Title pt will improve 124malk speed to 1.2 m/s to improve her community mobility    Baseline 0.84 m/s with SPC, 0.78 with no AD (10/25/15)   Time 8   Period Weeks   Status Partially Met     PT LONG TERM GOAL #3   Title pt will improve 5xsit to stand time to <14.52m15mdemonstrating improved LE strength   Time 8   Period Weeks   Status Achieved     PT LONG TERM GOAL #4   Title pt will improve ankle strength to 4/5 to improve her static standing balance in the shower.    Time 8   Period Weeks   Status On-going     PT LONG TERM GOAL #5   Title pt will be able to demonstrate proper lifting technique 3/5 times in order to demonstrate improved balance and decrease risk fo falls.    Time 4   Period Weeks   Status On-going               Plan - 10/25/15 1604    Clinical Impression Statement SPT reassessed pt's outcome measures this date and pt balance and LE strength remain limited. Pt demonstrates most difficulty with NBOS as well as LE endurance. Pt reports having a recent fall when  attempting to clear a threshold in her house. Pt required frequent rest breaks during treatment session. She needs continued skilled PT intervention to maximize overall function and decrease risk of falls.   Rehab Potential Good   Clinical Impairments Affecting Rehab Potential neuropathy, chronic LBP, hx of CA, cardiac history, fatigue following  CA treatment    PT Frequency 2x / week   PT Duration 4 weeks   PT Treatment/Interventions Aquatic Therapy;Gait training;Stair training;Therapeutic activities;Therapeutic exercise;Balance training;Neuromuscular re-education;Patient/family education;Cryotherapy;Vestibular   PT Next Visit Plan increase resistance/balance as tolerated   Consulted and Agree with Plan of Care Patient      Patient will benefit from skilled therapeutic intervention in order to improve the following deficits and impairments:  Decreased strength, Abnormal gait, Impaired flexibility, Decreased activity tolerance, Decreased balance, Decreased range of motion, Impaired sensation, Pain, Decreased endurance  Visit Diagnosis: Unsteadiness on feet - Plan: PT plan of care cert/re-cert  Muscle weakness (generalized) - Plan: PT plan of care cert/re-cert     Problem List Patient Active Problem List   Diagnosis Date Noted  . Absence of bladder continence 07/21/2015  . Neuropathy ([place]) 06/02/2015  . Postoperative state 04/08/2015  . Carcinoma of endometrium ([place]) 04/02/2015  . Endometrial cancer ([place]) 03/24/2015  . Gastro-esophageal reflux disease without esophagitis 02/15/2015  . Osteopenia 08/17/2014  . Hyperlipidemia 02/13/2014  . Kidney stones 10/27/2013  . CA skin, basal cell 06/30/2013  . Narrowing of intervertebral disc space 06/30/2013  . Accumulation of fluid in tissues 06/30/2013  . Benign essential HTN 06/30/2013  . Calculus of kidney 06/30/2013  . Lumbar canal stenosis 06/30/2013  . Arthritis, degenerative 06/30/2013  . Neuralgia neuritis, sciatic nerve  06/30/2013  . Edema 06/30/2013  . Heart murmur 06/30/2013  . Hematuria 12/19/2012  . CAD (coronary artery disease)- elective CFX DES 10/24/12 for recurrent angina 10/25/2012  . Dyslipidemia 10/25/2012  . NSVT (nonsustained ventricular tachycardia) ([place]) 10/25/2012  . Bradycardia- tolerating low dose beta blocker 10/25/2012  . Cardiomyopathy, ischemic: EF 35-40% by echo post-MI 10/20/12 10/22/2012  . STEMI 10/19/12- LAD DES, and CFX DES 10/24/12 10/20/2012  . HTN (hypertension) 10/20/2012   Geraldine Solar, SPT  This entire session was performed under direct supervision and direction of a licensed therapist/therapist assistant . I have personally read, edited and approve of the note as written. Gorden Harms. Tortorici, PT, DPT [phone]   Tortorici,Ashley 10/25/2015, 5:39 PM  [place] MAIN [facility] SERVICES [address] [place], [place], [address] Phone: [phone]   Fax:  [phone]  Name: Susan [name] MRN: [id number] Date of Birth: 1934-06-10

## 2015-10-28 ENCOUNTER — Ambulatory Visit: Payer: Medicare Other | Attending: Hematology and Oncology

## 2015-10-28 DIAGNOSIS — R2681 Unsteadiness on feet: Secondary | ICD-10-CM | POA: Insufficient documentation

## 2015-10-28 DIAGNOSIS — M6281 Muscle weakness (generalized): Secondary | ICD-10-CM | POA: Diagnosis present

## 2015-10-28 NOTE — Therapy (Signed)
[place] MAIN [facility] SERVICES [address] [place], [place], [address] Phone: [phone]   Fax:  [phone]  Physical Therapy Treatment  Patient Details  Name: Susan [name] MRN: [id number] Date of Birth: Feb 11, 1935 Referring Provider: Jeb Levering  Encounter Date: 10/28/2015      PT End of Session - 10/28/15 1435    Visit Number 12   Number of Visits 25   Date for PT Re-Evaluation 10/20/15   Authorization Type 2/10   PT Start Time 1432   PT Stop Time 1515   PT Time Calculation (min) 43 min   Equipment Utilized During Treatment Gait belt   Activity Tolerance Patient tolerated treatment well   Behavior During Therapy [facility] for tasks assessed/performed      Past Medical History:  Diagnosis Date  . Bradycardia   . Coronary artery disease   . Heart murmur   . Hyperlipidemia   . Hypertension   . Myocardial infarction ([place])   . NSVT (nonsustained ventricular tachycardia) ([place])   . Uterine cancer ([place])    serrous    Past Surgical History:  Procedure Laterality Date  . ABDOMINAL HYSTERECTOMY    . LEFT HEART CATHETERIZATION WITH CORONARY ANGIOGRAM  10/19/2012   Procedure: LEFT HEART CATHETERIZATION WITH CORONARY ANGIOGRAM;  Surgeon: Troy Sine, MD;  Location: [facility] CATH LAB;  Service: Cardiovascular;;  . PERCUTANEOUS CORONARY STENT INTERVENTION (PCI-S)  10/19/2012   Procedure: PERCUTANEOUS CORONARY STENT INTERVENTION (PCI-S);  Surgeon: Troy Sine, MD;  Location: [facility] CATH LAB;  Service: Cardiovascular;;  . PERCUTANEOUS CORONARY STENT INTERVENTION (PCI-S) N/A 10/23/2012   Procedure: PERCUTANEOUS CORONARY STENT INTERVENTION (PCI-S);  Surgeon: Troy Sine, MD;  Location: [facility] CATH LAB;  Service: Cardiovascular;  Laterality: N/A;  . PERIPHERAL VASCULAR CATHETERIZATION N/A 05/05/2015   Procedure: Glori Luis Cath Insertion;  Surgeon: Algernon Huxley, MD;  Location: [place] CV LAB;  Service: Cardiovascular;  Laterality: N/A;    There were no  vitals filed for this visit.      Subjective Assessment - 10/28/15 1435    Subjective Pt states she was sore following last treatment session. 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   Pertinent History chronic LBP, uterine CA, hx of MI   Patient Stated Goals Improve balance, walk without cane    Currently in Pain? Yes   Pain Score 5    Pain Location Shoulder   Pain Orientation Left   Pain Descriptors / Indicators Throbbing   Pain Onset 1 to 4 weeks ago       Therex: Nustep L3 x 5 min (unbilled) Wall sits x 5 sec hold, 2x10; SBA, pt demonstrated fatigue of BLE  Bil side stepping in // bars x 5 laps; min verbal cues for increased step width  NMR: Fwd step ups with airex on step and airex on floor Fwd/retro tandem walking on long airex x 10 laps with no UE support fwd and 1 UE support retro; CGA for balance Bil staggered stance with front foot on dynadisc and trunk rotation/ball hand off, 2x10; CGA - min A for balance, increased difficulty with LLE back Star drill on airex 1x5 each leg; CGA - min A for balance, increased difficulty when on L leg tapping R        PT Education - 10/28/15 1612    Education provided Yes   Education Details exercise technique, weight shifting   Person(s) Educated Patient   Methods Explanation  Comprehension Verbalized understanding             PT Long Term Goals - 10/25/15 1605      PT LONG TERM GOAL #1   Title pt will improve Berg balance score by 6pts to reduce fall risk    Time 8   Period Weeks   Status Achieved     PT LONG TERM GOAL #2   Title pt will improve 84mwalk speed to 1.2 m/s to improve her community mobility    Baseline 0.84 m/s with SPC, 0.78 with no AD (10/25/15)   Time 8   Period Weeks   Status Partially Met     PT LONG TERM GOAL #3   Title pt will improve 5xsit to stand time to <14.295m demonstrating improved LE strength   Time 8   Period Weeks   Status Achieved     PT LONG  TERM GOAL #4   Title pt will improve ankle strength to 4/5 to improve her static standing balance in the shower.    Time 8   Period Weeks   Status On-going     PT LONG TERM GOAL #5   Title pt will be able to demonstrate proper lifting technique 3/5 times in order to demonstrate improved balance and decrease risk fo falls.    Time 4   Period Weeks   Status On-going               Plan - 10/28/15 1612    Clinical Impression Statement Pt making steady progress towards goals. She tolerated progressed balance and strengthening exercises this date. She demonstrates increased difficulty with LLE throughout all activities as evidenced by decreased endurance, strenght and balance on LLE. She needs continued skilled PT intervention to maximize overall function and decrease risk of falls.   Rehab Potential Good   Clinical Impairments Affecting Rehab Potential neuropathy, chronic LBP, hx of CA, cardiac history, fatigue following CA treatment    PT Frequency 2x / week   PT Duration 4 weeks   PT Treatment/Interventions Aquatic Therapy;Gait training;Stair training;Therapeutic activities;Therapeutic exercise;Balance training;Neuromuscular re-education;Patient/family education;Cryotherapy;Vestibular   PT Next Visit Plan increase resistance/balance as tolerated   Consulted and Agree with Plan of Care Patient      Patient will benefit from skilled therapeutic intervention in order to improve the following deficits and impairments:  Decreased strength, Abnormal gait, Impaired flexibility, Decreased activity tolerance, Decreased balance, Decreased range of motion, Impaired sensation, Pain, Decreased endurance  Visit Diagnosis: Unsteadiness on feet  Muscle weakness (generalized)     Problem List Patient Active Problem List   Diagnosis Date Noted  . Absence of bladder continence 07/21/2015  . Neuropathy ([place]03/10/2015  . Postoperative state 04/08/2015  . Carcinoma of endometrium ([place] 04/02/2015  . Endometrial cancer ([place]12/28/2016  . Gastro-esophageal reflux disease without esophagitis 02/15/2015  . Osteopenia 08/17/2014  . Hyperlipidemia 02/13/2014  . Kidney stones 10/27/2013  . CA skin, basal cell 06/30/2013  . Narrowing of intervertebral disc space 06/30/2013  . Accumulation of fluid in tissues 06/30/2013  . Benign essential HTN 06/30/2013  . Calculus of kidney 06/30/2013  . Lumbar canal stenosis 06/30/2013  . Arthritis, degenerative 06/30/2013  . Neuralgia neuritis, sciatic nerve 06/30/2013  . Edema 06/30/2013  . Heart murmur 06/30/2013  . Hematuria 12/19/2012  . CAD (coronary artery disease)- elective CFX DES 10/24/12 for recurrent angina 10/25/2012  . Dyslipidemia 10/25/2012  . NSVT (nonsustained ventricular tachycardia) ([place]08/03/2012  . Bradycardia- tolerating low dose beta  blocker 10/25/2012  . Cardiomyopathy, ischemic: EF 35-40% by echo post-MI 10/20/12 10/22/2012  . STEMI 10/19/12- LAD DES, and CFX DES 10/24/12 10/20/2012  . HTN (hypertension) 10/20/2012   Geraldine Solar, SPT This entire session was performed under direct supervision and direction of a licensed therapist/therapist assistant . I have personally read, edited and approve of the note as written. Gorden Harms. Tortorici, PT, DPT [phone]  Tortorici,Ashley 10/28/2015, 5:19 PM  [place] MAIN [facility] SERVICES [address] [place], [place], [address] Phone: [phone]   Fax:  [phone]  Name: Susan [name] MRN: [id number] Date of Birth: Aug 24, 1934

## 2015-11-01 ENCOUNTER — Ambulatory Visit: Payer: Medicare Other

## 2015-11-01 DIAGNOSIS — R2681 Unsteadiness on feet: Secondary | ICD-10-CM

## 2015-11-01 DIAGNOSIS — M6281 Muscle weakness (generalized): Secondary | ICD-10-CM

## 2015-11-01 NOTE — Therapy (Signed)
[place] MAIN [facility] SERVICES [address] [place], [place], [address] Phone: [phone]   Fax:  [phone]  Physical Therapy Treatment  Patient Details  Name: Susan [name] MRN: [id number] Date of Birth: 1934-12-29 Referring Provider: Jeb Levering  Encounter Date: 11/01/2015      PT End of Session - 11/01/15 1411    Visit Number 13   Number of Visits 25   Date for PT Re-Evaluation 10/20/15   Authorization Type 3/10   PT Start Time 1309   PT Stop Time 1347   PT Time Calculation (min) 38 min   Equipment Utilized During Treatment Gait belt   Activity Tolerance Patient tolerated treatment well   Behavior During Therapy [facility] for tasks assessed/performed      Past Medical History:  Diagnosis Date  . Bradycardia   . Coronary artery disease   . Heart murmur   . Hyperlipidemia   . Hypertension   . Myocardial infarction ([place])   . NSVT (nonsustained ventricular tachycardia) ([place])   . Uterine cancer ([place])    serrous    Past Surgical History:  Procedure Laterality Date  . ABDOMINAL HYSTERECTOMY    . LEFT HEART CATHETERIZATION WITH CORONARY ANGIOGRAM  10/19/2012   Procedure: LEFT HEART CATHETERIZATION WITH CORONARY ANGIOGRAM;  Surgeon: Troy Sine, MD;  Location: [facility] CATH LAB;  Service: Cardiovascular;;  . PERCUTANEOUS CORONARY STENT INTERVENTION (PCI-S)  10/19/2012   Procedure: PERCUTANEOUS CORONARY STENT INTERVENTION (PCI-S);  Surgeon: Troy Sine, MD;  Location: [facility] CATH LAB;  Service: Cardiovascular;;  . PERCUTANEOUS CORONARY STENT INTERVENTION (PCI-S) N/A 10/23/2012   Procedure: PERCUTANEOUS CORONARY STENT INTERVENTION (PCI-S);  Surgeon: Troy Sine, MD;  Location: [facility] CATH LAB;  Service: Cardiovascular;  Laterality: N/A;  . PERIPHERAL VASCULAR CATHETERIZATION N/A 05/05/2015   Procedure: Glori Luis Cath Insertion;  Surgeon: Algernon Huxley, MD;  Location: [facility] CV LAB;  Service: Cardiovascular;  Laterality: N/A;    There were no  vitals filed for this visit.      Subjective Assessment - 11/01/15 1422    Subjective Pt states she feels tired today because of the rain.   Pertinent History chronic LBP, uterine CA, hx of MI   Patient Stated Goals Improve balance, walk without cane    Currently in Pain? No/denies   Pain Onset 1 to 4 weeks ago      Therex: Resisted walking 4-way with 12.5# x 3 laps each; CGA for balance, increased unsteadiness with retro and R lateral walking Bil step ups and contralateral march on 6" step with airex on top with 1 light UE support (fingers), 2x10 each; pt demonstrated fatigue following exercise   Gait training in hall: Bil side stepping with directional changes and fwd/retro walking with directional changes x 5 min each while holding 5#DB; SBA for balance, retro more difficult Bil side stepping with turn 132f without DB and 1548fwith 5#DB; increased unsteadiness with turns with weight; CGA for balance Bil side stepping on long airex with ball toss against mirror x 10 laps; CGA - min A for balance      PT Education - 11/01/15 1423    Education provided Yes   Education Details gait, exercise technique   Person(s) Educated Patient   Methods Explanation   Comprehension Verbalized understanding             PT Long Term Goals - 10/25/15 1605      PT LONG TERM GOAL #1   Title pt will improve  Berg balance score by 6pts to reduce fall risk    Time 8   Period Weeks   Status Achieved     PT LONG TERM GOAL #2   Title pt will improve 73mwalk speed to 1.2 m/s to improve her community mobility    Baseline 0.84 m/s with SPC, 0.78 with no AD (10/25/15)   Time 8   Period Weeks   Status Partially Met     PT LONG TERM GOAL #3   Title pt will improve 5xsit to stand time to <14.263m demonstrating improved LE strength   Time 8   Period Weeks   Status Achieved     PT LONG TERM GOAL #4   Title pt will improve ankle strength to 4/5 to improve her static standing balance in the  shower.    Time 8   Period Weeks   Status On-going     PT LONG TERM GOAL #5   Title pt will be able to demonstrate proper lifting technique 3/5 times in order to demonstrate improved balance and decrease risk fo falls.    Time 4   Period Weeks   Status On-going               Plan - 11/01/15 1420    Clinical Impression Statement Pt continues to make progress towards goals. She tolerated progressed gait training this date with making turns and simulating carrying plate of food. Pt unsteady when making turns and carrying object and demonstrated fatigue trowards end of session. Pt needs continued skilled PT intervention to maximize overall function and decrease risk of falls.   Rehab Potential Good   Clinical Impairments Affecting Rehab Potential neuropathy, chronic LBP, hx of CA, cardiac history, fatigue following CA treatment    PT Frequency 2x / week   PT Duration 4 weeks   PT Treatment/Interventions Aquatic Therapy;Gait training;Stair training;Therapeutic activities;Therapeutic exercise;Balance training;Neuromuscular re-education;Patient/family education;Cryotherapy;Vestibular   PT Next Visit Plan increase resistance/balance as tolerated   Consulted and Agree with Plan of Care Patient      Patient will benefit from skilled therapeutic intervention in order to improve the following deficits and impairments:  Decreased strength, Abnormal gait, Impaired flexibility, Decreased activity tolerance, Decreased balance, Decreased range of motion, Impaired sensation, Pain, Decreased endurance  Visit Diagnosis: Unsteadiness on feet  Muscle weakness (generalized)     Problem List Patient Active Problem List   Diagnosis Date Noted  . Absence of bladder continence 07/21/2015  . Neuropathy (HCOlin03/10/2015  . Postoperative state 04/08/2015  . Carcinoma of endometrium (HCL[place]il[place]01/08/2015  . Endometrial cancer (HCE[place]12/28/2016  . Gastro-esophageal reflux disease without esophagitis  02/15/2015  . Osteopenia 08/17/2014  . Hyperlipidemia 02/13/2014  . Kidney stones 10/27/2013  . CA skin, basal cell 06/30/2013  . Narrowing of intervertebral disc space 06/30/2013  . Accumulation of fluid in tissues 06/30/2013  . Benign essential HTN 06/30/2013  . Calculus of kidney 06/30/2013  . Lumbar canal stenosis 06/30/2013  . Arthritis, degenerative 06/30/2013  . Neuralgia neuritis, sciatic nerve 06/30/2013  . Edema 06/30/2013  . Heart murmur 06/30/2013  . Hematuria 12/19/2012  . CAD (coronary artery disease)- elective CFX DES 10/24/12 for recurrent angina 10/25/2012  . Dyslipidemia 10/25/2012  . NSVT (nonsustained ventricular tachycardia) (HCB[place]08/03/2012  . Bradycardia- tolerating low dose beta blocker 10/25/2012  . Cardiomyopathy, ischemic: EF 35-40% by echo post-MI 10/20/12 10/22/2012  . STEMI 10/19/12- LAD DES, and CFX DES 10/24/12 10/20/2012  . HTN (hypertension) 10/20/2012   BrGeraldine SolarSPT  This entire session was performed under direct supervision and direction of a licensed therapist/therapist assistant . I have personally read, edited and approve of the note as written. Gorden Harms. Tortorici, PT, DPT [phone]  Tortorici,Ashley 11/01/2015, 5:00 PM  [place] MAIN [facility] SERVICES [address] [place], [place], [address] Phone: [phone]   Fax:  [phone]  Name: Susan [name] MRN: [id number] Date of Birth: January 04, 1935

## 2015-11-03 ENCOUNTER — Ambulatory Visit: Payer: Medicare Other

## 2015-11-03 DIAGNOSIS — M6281 Muscle weakness (generalized): Secondary | ICD-10-CM

## 2015-11-03 DIAGNOSIS — R2681 Unsteadiness on feet: Secondary | ICD-10-CM

## 2015-11-03 NOTE — Therapy (Signed)
[place] MAIN [facility] SERVICES [address] [place], [place], [address] Phone: [phone]   Fax:  [phone]  Physical Therapy Treatment  Patient Details  Name: Susan [name] MRN: [id number] Date of Birth: 1934/08/13 Referring Provider: Jeb Levering  Encounter Date: 11/03/2015      PT End of Session - 11/03/15 1334    Visit Number 14   Number of Visits 25   Date for PT Re-Evaluation 10/20/15   Authorization Type 4/10   PT Start Time 3437   PT Stop Time 1347   PT Time Calculation (min) 34 min   Equipment Utilized During Treatment Gait belt   Activity Tolerance Patient tolerated treatment well   Behavior During Therapy [facility] for tasks assessed/performed      Past Medical History:  Diagnosis Date  . Bradycardia   . Coronary artery disease   . Heart murmur   . Hyperlipidemia   . Hypertension   . Myocardial infarction ([place])   . NSVT (nonsustained ventricular tachycardia) ([place])   . Uterine cancer ([place])    serrous    Past Surgical History:  Procedure Laterality Date  . ABDOMINAL HYSTERECTOMY    . LEFT HEART CATHETERIZATION WITH CORONARY ANGIOGRAM  10/19/2012   Procedure: LEFT HEART CATHETERIZATION WITH CORONARY ANGIOGRAM;  Surgeon: Troy Sine, MD;  Location: [facility] CATH LAB;  Service: Cardiovascular;;  . PERCUTANEOUS CORONARY STENT INTERVENTION (PCI-S)  10/19/2012   Procedure: PERCUTANEOUS CORONARY STENT INTERVENTION (PCI-S);  Surgeon: Troy Sine, MD;  Location: [facility] CATH LAB;  Service: Cardiovascular;;  . PERCUTANEOUS CORONARY STENT INTERVENTION (PCI-S) N/A 10/23/2012   Procedure: PERCUTANEOUS CORONARY STENT INTERVENTION (PCI-S);  Surgeon: Troy Sine, MD;  Location: [facility] CATH LAB;  Service: Cardiovascular;  Laterality: N/A;  . PERIPHERAL VASCULAR CATHETERIZATION N/A 05/05/2015   Procedure: Glori Luis Cath Insertion;  Surgeon: Algernon Huxley, MD;  Location: [place] CV LAB;  Service: Cardiovascular;  Laterality: N/A;    There were no  vitals filed for this visit.      Subjective Assessment - 11/03/15 1333    Subjective Pt reports she is a little sore today and that she mulched some this morning.   Pertinent History chronic LBP, uterine CA, hx of MI   Patient Stated Goals Improve balance, walk without cane    Currently in Pain? Yes   Pain Score 2    Pain Location Back   Pain Orientation Right;Left   Pain Descriptors / Indicators Sore   Pain Onset 1 to 4 weeks ago      NMR: Nustep L8 maintaining SPM above 70 x 2 min; min verbal cues for proper technique; pt demonstrated fatigue Bil tandem stance on airex with EC 10 x 15 sec each Bil tandem stance on  foam roll (upside down) with OH elevation with ball, 3x10 each Bil SLS with UE lifts with 3# weight bar, 3x10 each; min A for balance Lateral step ups and over with airex on top of 4" step, 1x10 each  Pt had increased difficulty with LLE throughout all balance activities. Pt required CGA-mod A throughout all exercises.       PT Education - 11/03/15 1334    Education provided Yes   Education Details proper weight shifting during NMR   Person(s) Educated Patient   Methods Explanation   Comprehension Verbalized understanding             PT Long Term Goals - 10/25/15 1605      PT LONG TERM GOAL #1  Title pt will improve Berg balance score by 6pts to reduce fall risk    Time 8   Period Weeks   Status Achieved     PT LONG TERM GOAL #2   Title pt will improve 34mwalk speed to 1.2 m/s to improve her community mobility    Baseline 0.84 m/s with SPC, 0.78 with no AD (10/25/15)   Time 8   Period Weeks   Status Partially Met     PT LONG TERM GOAL #3   Title pt will improve 5xsit to stand time to <14.273m demonstrating improved LE strength   Time 8   Period Weeks   Status Achieved     PT LONG TERM GOAL #4   Title pt will improve ankle strength to 4/5 to improve her static standing balance in the shower.    Time 8   Period Weeks   Status On-going      PT LONG TERM GOAL #5   Title pt will be able to demonstrate proper lifting technique 3/5 times in order to demonstrate improved balance and decrease risk fo falls.    Time 4   Period Weeks   Status On-going               Plan - 11/03/15 1537    Clinical Impression Statement Treatment session limited due to patient being late to appointment. Pt demonstrated difficulty with dynamic balance this date as evidenced by increased need for UE support to prevent LOB throughout. Pt demonstrated fatigue and difficulty balancing with BLE, L>R, throughout all exercises this date. Pt needs continued skilled PT intervention to maximize overall function and decrease risk of falls.   Rehab Potential Good   Clinical Impairments Affecting Rehab Potential neuropathy, chronic LBP, hx of CA, cardiac history, fatigue following CA treatment    PT Frequency 2x / week   PT Duration 4 weeks   PT Treatment/Interventions Aquatic Therapy;Gait training;Stair training;Therapeutic activities;Therapeutic exercise;Balance training;Neuromuscular re-education;Patient/family education;Cryotherapy;Vestibular   PT Next Visit Plan increase resistance/balance as tolerated   Consulted and Agree with Plan of Care Patient      Patient will benefit from skilled therapeutic intervention in order to improve the following deficits and impairments:  Decreased strength, Abnormal gait, Impaired flexibility, Decreased activity tolerance, Decreased balance, Decreased range of motion, Impaired sensation, Pain, Decreased endurance  Visit Diagnosis: Unsteadiness on feet  Muscle weakness (generalized)     Problem List Patient Active Problem List   Diagnosis Date Noted  . Absence of bladder continence 07/21/2015  . Neuropathy (HCTor[place]03/10/2015  . Postoperative state 04/08/2015  . Carcinoma of endometrium (HCHag[place]01/08/2015  . Endometrial cancer (HCHampton12/28/2016  . Gastro-esophageal reflux disease without esophagitis 02/15/2015   . Osteopenia 08/17/2014  . Hyperlipidemia 02/13/2014  . Kidney stones 10/27/2013  . CA skin, basal cell 06/30/2013  . Narrowing of intervertebral disc space 06/30/2013  . Accumulation of fluid in tissues 06/30/2013  . Benign essential HTN 06/30/2013  . Calculus of kidney 06/30/2013  . Lumbar canal stenosis 06/30/2013  . Arthritis, degenerative 06/30/2013  . Neuralgia neuritis, sciatic nerve 06/30/2013  . Edema 06/30/2013  . Heart murmur 06/30/2013  . Hematuria 12/19/2012  . CAD (coronary artery disease)- elective CFX DES 10/24/12 for recurrent angina 10/25/2012  . Dyslipidemia 10/25/2012  . NSVT (nonsustained ventricular tachycardia) (HCWellton08/03/2012  . Bradycardia- tolerating low dose beta blocker 10/25/2012  . Cardiomyopathy, ischemic: EF 35-40% by echo post-MI 10/20/12 10/22/2012  . STEMI 10/19/12- LAD DES, and CFX DES 10/24/12 10/20/2012  .  HTN (hypertension) 10/20/2012   Geraldine Solar, SPT This entire session was performed under direct supervision and direction of a licensed therapist/therapist assistant . I have personally read, edited and approve of the note as written. Gorden Harms. Tortorici, PT, DPT [phone]  Tortorici,Ashley 11/03/2015, 4:03 PM  [place] MAIN [facility] SERVICES [address] [place], [place], [address] Phone: [phone]   Fax:  [phone]  Name: NAHIMA ALES MRN: [id number] Date of Birth: 1934-08-14

## 2015-11-05 ENCOUNTER — Other Ambulatory Visit: Payer: Self-pay | Admitting: Cardiovascular Disease

## 2015-11-05 NOTE — Telephone Encounter (Signed)
REFILL 

## 2015-11-08 ENCOUNTER — Ambulatory Visit: Payer: Medicare Other

## 2015-11-08 DIAGNOSIS — M6281 Muscle weakness (generalized): Secondary | ICD-10-CM

## 2015-11-08 DIAGNOSIS — R2681 Unsteadiness on feet: Secondary | ICD-10-CM

## 2015-11-08 NOTE — Therapy (Signed)
[place] MAIN [facility] SERVICES [address] [place], [place], [address] Phone: [phone]   Fax:  [phone]  Physical Therapy Treatment  Patient Details  Name: Susan [name] MRN: [id number] Date of Birth: October 29, 1934 Referring Provider: Jeb Levering  Encounter Date: 11/08/2015      PT End of Session - 11/08/15 1308    Visit Number 15   Number of Visits 25   Date for PT Re-Evaluation 10/20/15   Authorization Type 5/10   PT Start Time 5681   PT Stop Time 1346   PT Time Calculation (min) 43 min   Equipment Utilized During Treatment Gait belt   Activity Tolerance Patient tolerated treatment well   Behavior During Therapy [facility] for tasks assessed/performed      Past Medical History:  Diagnosis Date  . Bradycardia   . Coronary artery disease   . Heart murmur   . Hyperlipidemia   . Hypertension   . Myocardial infarction ([place])   . NSVT (nonsustained ventricular tachycardia) ([place])   . Uterine cancer ([place])    serrous    Past Surgical History:  Procedure Laterality Date  . ABDOMINAL HYSTERECTOMY    . LEFT HEART CATHETERIZATION WITH CORONARY ANGIOGRAM  10/19/2012   Procedure: LEFT HEART CATHETERIZATION WITH CORONARY ANGIOGRAM;  Surgeon: Troy Sine, MD;  Location: [facility] CATH LAB;  Service: Cardiovascular;;  . PERCUTANEOUS CORONARY STENT INTERVENTION (PCI-S)  10/19/2012   Procedure: PERCUTANEOUS CORONARY STENT INTERVENTION (PCI-S);  Surgeon: Troy Sine, MD;  Location: [facility] CATH LAB;  Service: Cardiovascular;;  . PERCUTANEOUS CORONARY STENT INTERVENTION (PCI-S) N/A 10/23/2012   Procedure: PERCUTANEOUS CORONARY STENT INTERVENTION (PCI-S);  Surgeon: Troy Sine, MD;  Location: [facility] CATH LAB;  Service: Cardiovascular;  Laterality: N/A;  . PERIPHERAL VASCULAR CATHETERIZATION N/A 05/05/2015   Procedure: Glori Luis Cath Insertion;  Surgeon: Algernon Huxley, MD;  Location: [place] CV LAB;  Service: Cardiovascular;  Laterality: N/A;    There were no  vitals filed for this visit.      Subjective Assessment - 11/08/15 1308    Subjective Pt reports being sore following last treatment session.   Pertinent History chronic LBP, uterine CA, hx of MI   Patient Stated Goals Improve balance, walk without cane    Currently in Pain? Yes   Pain Score 5    Pain Location Back   Pain Orientation Right;Left   Pain Descriptors / Indicators Sore   Pain Onset 1 to 4 weeks ago     NMR: Bil staggered stance on large rocker board and lifts with RTB, and bil trunk rotation with small yellow medicine ball, maintaining balance in middle, 2x10 each; pt with increased posterior LOB; CGA - min A throughout, increased difficulty with LLE back Bil staggered stance with front foot on dyna disc and rear foot on airex with balloon punches x 8 min; increased difficulty with LLE back and increased R posterolateral LOB throughout requiring CGA - min A Bil march on airex with 3 sec hold at top, 1x10 each; CGA for balance, increased difficulty with LLE  Therex: Nustep L4 x 5 min (unbilled) Resisted walking 4-way x 15# x 3 laps each; increased difficulty with R lateral walking, 2 small LOB but pt able to maintain balance with CGA, pt demonstrated fatigue towards end       PT Education - 11/08/15 1504    Education provided Yes   Education Details exercise technique, maintaining weight over BOS   Person(s) Educated Patient   Methods  Explanation   Comprehension Verbalized understanding             PT Long Term Goals - 10/25/15 1605      PT LONG TERM GOAL #1   Title pt will improve Berg balance score by 6pts to reduce fall risk    Time 8   Period Weeks   Status Achieved     PT LONG TERM GOAL #2   Title pt will improve 44mwalk speed to 1.2 m/s to improve her community mobility    Baseline 0.84 m/s with SPC, 0.78 with no AD (10/25/15)   Time 8   Period Weeks   Status Partially Met     PT LONG TERM GOAL #3   Title pt will improve 5xsit to stand time  to <14.275m demonstrating improved LE strength   Time 8   Period Weeks   Status Achieved     PT LONG TERM GOAL #4   Title pt will improve ankle strength to 4/5 to improve her static standing balance in the shower.    Time 8   Period Weeks   Status On-going     PT LONG TERM GOAL #5   Title pt will be able to demonstrate proper lifting technique 3/5 times in order to demonstrate improved balance and decrease risk fo falls.    Time 4   Period Weeks   Status On-going               Plan - 11/08/15 1505    Clinical Impression Statement Pt continues to make progress as demonstrated by ability to perform progressed dynamic balance activities. Pt continues to demonstrate LLE weakness > RLE as well as fatigue throughout. Pt has increased difficulty when balancing on LLE as well. pt needs continued skilled PT intervention to maximize overall function.   Rehab Potential Good   Clinical Impairments Affecting Rehab Potential neuropathy, chronic LBP, hx of CA, cardiac history, fatigue following CA treatment    PT Frequency 2x / week   PT Duration 4 weeks   PT Treatment/Interventions Aquatic Therapy;Gait training;Stair training;Therapeutic activities;Therapeutic exercise;Balance training;Neuromuscular re-education;Patient/family education;Cryotherapy;Vestibular   PT Next Visit Plan increase resistance/balance as tolerated   Consulted and Agree with Plan of Care Patient      Patient will benefit from skilled therapeutic intervention in order to improve the following deficits and impairments:  Decreased strength, Abnormal gait, Impaired flexibility, Decreased activity tolerance, Decreased balance, Decreased range of motion, Impaired sensation, Pain, Decreased endurance  Visit Diagnosis: Unsteadiness on feet  Muscle weakness (generalized)     Problem List Patient Active Problem List   Diagnosis Date Noted  . Absence of bladder continence 07/21/2015  . Neuropathy (HCBonneville03/10/2015  .  Postoperative state 04/08/2015  . Carcinoma of endometrium (HCMoorefield01/08/2015  . Endometrial cancer (HCCochran12/28/2016  . Gastro-esophageal reflux disease without esophagitis 02/15/2015  . Osteopenia 08/17/2014  . Hyperlipidemia 02/13/2014  . Kidney stones 10/27/2013  . CA skin, basal cell 06/30/2013  . Narrowing of intervertebral disc space 06/30/2013  . Accumulation of fluid in tissues 06/30/2013  . Benign essential HTN 06/30/2013  . Calculus of kidney 06/30/2013  . Lumbar canal stenosis 06/30/2013  . Arthritis, degenerative 06/30/2013  . Neuralgia neuritis, sciatic nerve 06/30/2013  . Edema 06/30/2013  . Heart murmur 06/30/2013  . Hematuria 12/19/2012  . CAD (coronary artery disease)- elective CFX DES 10/24/12 for recurrent angina 10/25/2012  . Dyslipidemia 10/25/2012  . NSVT (nonsustained ventricular tachycardia) (HCEast Enterprise08/03/2012  . Bradycardia- tolerating low  dose beta blocker 10/25/2012  . Cardiomyopathy, ischemic: EF 35-40% by echo post-MI 10/20/12 10/22/2012  . STEMI 10/19/12- LAD DES, and CFX DES 10/24/12 10/20/2012  . HTN (hypertension) 10/20/2012   Geraldine Solar, SPT This entire session was performed under direct supervision and direction of a licensed therapist/therapist assistant . I have personally read, edited and approve of the note as written. Gorden Harms. Tortorici, PT, DPT [phone]  Tortorici,Ashley 11/08/2015, 5:02 PM  [place] MAIN [facility] SERVICES [address] [place], [place], [address] Phone: [phone]   Fax:  [phone]  Name: Susan [name] MRN: [id number] Date of Birth: February 15, 1935

## 2015-11-10 ENCOUNTER — Ambulatory Visit: Payer: Medicare Other

## 2015-11-10 DIAGNOSIS — R2681 Unsteadiness on feet: Secondary | ICD-10-CM | POA: Diagnosis not present

## 2015-11-10 DIAGNOSIS — M6281 Muscle weakness (generalized): Secondary | ICD-10-CM

## 2015-11-10 NOTE — Therapy (Signed)
[place] MAIN [facility] SERVICES [address] [place], [place], [address] Phone: [phone]   Fax:  [phone]  Physical Therapy Treatment  Patient Details  Name: Susan [name] MRN: [id number] Date of Birth: August 31, 1934 Referring Provider: Jeb Levering  Encounter Date: 11/10/2015      PT End of Session - 11/10/15 1306    Visit Number 16   Number of Visits 25   Date for PT Re-Evaluation 10/20/15   Authorization Type 6/10   PT Start Time 5852   PT Stop Time 1348   PT Time Calculation (min) 45 min   Equipment Utilized During Treatment Gait belt   Activity Tolerance Patient tolerated treatment well   Behavior During Therapy [facility] for tasks assessed/performed      Past Medical History:  Diagnosis Date  . Bradycardia   . Coronary artery disease   . Heart murmur   . Hyperlipidemia   . Hypertension   . Myocardial infarction ([place])   . NSVT (nonsustained ventricular tachycardia) ([place])   . Uterine cancer ([place])    serrous    Past Surgical History:  Procedure Laterality Date  . ABDOMINAL HYSTERECTOMY    . LEFT HEART CATHETERIZATION WITH CORONARY ANGIOGRAM  10/19/2012   Procedure: LEFT HEART CATHETERIZATION WITH CORONARY ANGIOGRAM;  Surgeon: Troy Sine, MD;  Location: [facility] CATH LAB;  Service: Cardiovascular;;  . PERCUTANEOUS CORONARY STENT INTERVENTION (PCI-S)  10/19/2012   Procedure: PERCUTANEOUS CORONARY STENT INTERVENTION (PCI-S);  Surgeon: Troy Sine, MD;  Location: [facility] CATH LAB;  Service: Cardiovascular;;  . PERCUTANEOUS CORONARY STENT INTERVENTION (PCI-S) N/A 10/23/2012   Procedure: PERCUTANEOUS CORONARY STENT INTERVENTION (PCI-S);  Surgeon: Troy Sine, MD;  [facility] CATH LAB;  Service: Cardiovascular;  Laterality: N/A;  . PERIPHERAL VASCULAR CATHETERIZATION N/A 05/05/2015   Procedure: Glori Luis Cath Insertion;  Surgeon: Algernon Huxley, MD;  Location: [place] CV LAB;  Service: Cardiovascular;  Laterality: N/A;    There were no  vitals filed for this visit.      Subjective Assessment - 11/10/15 1310    Subjective Pt reports being a little sore following last treatment session and that her L shoulder pain is increased today.   Pertinent History chronic LBP, uterine CA, hx of MI   Patient Stated Goals Improve balance, walk without cane    Currently in Pain? Yes   Pain Score 4    Pain Location Shoulder   Pain Orientation Left   Pain Descriptors / Indicators Sore   Pain Onset 1 to 4 weeks ago     Gait training: Nustep L4 x 5 min (unbilled) Fwd/lateral walking in hallway with ball toss (up; up and bounce), and with directional changes, x 15 min total Bil side stepping over obstacles (low hurdle, yoga blocks (x2)) in // bars with no UE support and dual task thinking of names with each letter of the alphabet x 8 min; mod verbal cues to increase step height and maintain dual task while performing side stepping; min A for balance throughout  NMR: SLS on airex with UE chops/lifts with 3# weight bar, 2x10 each; mod verbal cues for proper technique; min A for balance, increased lateral LOB bil Bil tandem stance with both feet on dynadisc with dual task thinking of female names and animals A-Z each; min A throughout, increased difficulty with RLE back      PT Education - 11/10/15 1520    Education provided Yes   Education Details exercise technique, proper weight shifting, dual  task   Person(s) Educated Patient   Methods Explanation   Comprehension Verbalized understanding             PT Long Term Goals - 10/25/15 1605      PT LONG TERM GOAL #1   Title pt will improve Berg balance score by 6pts to reduce fall risk    Time 8   Period Weeks   Status Achieved     PT LONG TERM GOAL #2   Title pt will improve 59mwalk speed to 1.2 m/s to improve her community mobility    Baseline 0.84 m/s with SPC, 0.78 with no AD (10/25/15)   Time 8   Period Weeks   Status Partially Met     PT LONG TERM GOAL #3   Title  pt will improve 5xsit to stand time to <14.211m demonstrating improved LE strength   Time 8   Period Weeks   Status Achieved     PT LONG TERM GOAL #4   Title pt will improve ankle strength to 4/5 to improve her static standing balance in the shower.    Time 8   Period Weeks   Status On-going     PT LONG TERM GOAL #5   Title pt will be able to demonstrate proper lifting technique 3/5 times in order to demonstrate improved balance and decrease risk fo falls.    Time 4   Period Weeks   Status On-going               Plan - 11/10/15 1520    Clinical Impression Statement Pt tolerated dual task activities during gait and balance training this session. Pt demonstrated increased unsteadiness during side stepping and dual task, requiring min A for balance. Pt dynamic balance continues to be decreased as demonstrated by difficulty with SLS and UE movements. Pt needs continued skilled PT intervention to maximize overall function and decrease risk of falls.   Rehab Potential Good   Clinical Impairments Affecting Rehab Potential neuropathy, chronic LBP, hx of CA, cardiac history, fatigue following CA treatment    PT Frequency 2x / week   PT Duration 4 weeks   PT Treatment/Interventions Aquatic Therapy;Gait training;Stair training;Therapeutic activities;Therapeutic exercise;Balance training;Neuromuscular re-education;Patient/family education;Cryotherapy;Vestibular   PT Next Visit Plan increase resistance/balance as tolerated   Consulted and Agree with Plan of Care Patient      Patient will benefit from skilled therapeutic intervention in order to improve the following deficits and impairments:  Decreased strength, Abnormal gait, Impaired flexibility, Decreased activity tolerance, Decreased balance, Decreased range of motion, Impaired sensation, Pain, Decreased endurance  Visit Diagnosis: Unsteadiness on feet  Muscle weakness (generalized)     Problem List Patient Active Problem List    Diagnosis Date Noted  . Absence of bladder continence 07/21/2015  . Neuropathy (HCBasehor03/10/2015  . Postoperative state 04/08/2015  . Carcinoma of endometrium (HCOrchards01/08/2015  . Endometrial cancer (HCBel-Nor12/28/2016  . Gastro-esophageal reflux disease without esophagitis 02/15/2015  . Osteopenia 08/17/2014  . Hyperlipidemia 02/13/2014  . Kidney stones 10/27/2013  . CA skin, basal cell 06/30/2013  . Narrowing of intervertebral disc space 06/30/2013  . Accumulation of fluid in tissues 06/30/2013  . Benign essential HTN 06/30/2013  . Calculus of kidney 06/30/2013  . Lumbar canal stenosis 06/30/2013  . Arthritis, degenerative 06/30/2013  . Neuralgia neuritis, sciatic nerve 06/30/2013  . Edema 06/30/2013  . Heart murmur 06/30/2013  . Hematuria 12/19/2012  . CAD (coronary artery disease)- elective CFX DES 10/24/12 for recurrent  angina 10/25/2012  . Dyslipidemia 10/25/2012  . NSVT (nonsustained ventricular tachycardia) ([place]) 10/25/2012  . Bradycardia- tolerating low dose beta blocker 10/25/2012  . Cardiomyopathy, ischemic: EF 35-40% by echo post-MI 10/20/12 10/22/2012  . STEMI 10/19/12- LAD DES, and CFX DES 10/24/12 10/20/2012  . HTN (hypertension) 10/20/2012   Geraldine Solar, SPT This entire session was performed under direct supervision and direction of a licensed therapist/therapist assistant . I have personally read, edited and approve of the note as written. Gorden Harms. Tortorici, PT, DPT [phone]  Tortorici,Ashley 11/10/2015, 4:03 PM  [place] MAIN [facility] SERVICES [address] [place], [place], [address] Phone: [phone]   Fax:  [phone]  Name: Susan [name] MRN: [id number] Date of Birth: 1934/06/16

## 2015-11-15 ENCOUNTER — Ambulatory Visit: Payer: Medicare Other

## 2015-11-15 VITALS — BP 90/67 | HR 57

## 2015-11-15 DIAGNOSIS — M6281 Muscle weakness (generalized): Secondary | ICD-10-CM

## 2015-11-15 DIAGNOSIS — R2681 Unsteadiness on feet: Secondary | ICD-10-CM | POA: Diagnosis not present

## 2015-11-15 NOTE — Therapy (Signed)
[facility] MAIN REHAB SERVICES [address] [place], [place], [address] Phone: [phone]   Fax:  [phone]  Physical Therapy Treatment  Patient Details  Name: Susan [name] MRN: [id number] Date of Birth: 11/01/1934 Referring Provider: Chos[place]  Encounter Date: 11/15/2015      PT End of Session - 11/15/15 1507    Visit Number 17   Number of Visits 25   Date for PT Re-Evaluation 12/20/15   Authorization Type 7/10   PT Start Time 1305   PT Stop Time 1345   PT Time Calculation (min) 40 min   Equipment Utilized During Treatment Gait belt   Activity Tolerance Patient tolerated treatment well   Behavior During Therapy WFL for tasks assessed/performed      Past Medical History:  Diagnosis Date  . Bradycardia   . Coronary artery disease   . Heart murmur   . Hyperlipidemia   . Hypertension   . Myocardial infarction (HCC)   . NSVT (nonsustained ventricular tachycardia) (HCC)   . Uterine cancer (HCC)    serrous    Past Surgical History:  Procedure Laterality Date  . ABDOMINAL HYSTERECTOMY    . LEFT HEART CATHETERIZATION WITH CORONARY ANGIOGRAM  10/19/2012   Procedure: LEFT HEART CATHETERIZATION WITH CORONARY ANGIOGRAM;  Surgeon: Thomas A Kelly, MD;  Location: [facility] CATH LAB;  Service: Cardiovascular;;  . PERCUTANEOUS CORONARY STENT INTERVENTION (PCI-S)  10/19/2012   Procedure: PERCUTANEOUS CORONARY STENT INTERVENTION (PCI-S);  Surgeon: Thomas A Kelly, MD;  Location: [facility] CATH LAB;  Service: Cardiovascular;;  . PERCUTANEOUS CORONARY STENT INTERVENTION (PCI-S) N/A 10/23/2012   Procedure: PERCUTANEOUS CORONARY STENT INTERVENTION (PCI-S);  Surgeon: Thomas A Kelly, MD;  Location: [facility] CATH LAB;  Service: Cardiovascular;  Laterality: N/A;  . PERIPHERAL VASCULAR CATHETERIZATION N/A 05/05/2015   Procedure: Porta Cath Insertion;  Surgeon: Jason S Dew, MD;  Location: [facility] INVASIVE CV LAB;  Service: Cardiovascular;  Laterality: N/A;    Vitals:   11/15/15 1308  BP: 90/67  Pulse: (!) 57  SpO2: 100%        Subjective Assessment - 11/15/15 1315    Subjective Pain in L shoulder, is planning on contacting ortho MD regarding "knot" in her shoulder.  Tries not to use her cane in her home but has to be careful when turning.  Says her pulse has been lower today, in the 60s.  Pt took two advil prior to session, typically takes these after.   Pertinent History chronic LBP, uterine CA, hx of MI   Patient Stated Goals Improve balance, walk without cane    Currently in Pain? No/denies  L shoulder pain, not related to current episode   Multiple Pain Sites --                 TREATMENT  NEUROMUSCULAR RE-EDUCATION Nustep L4 x 5 min (2 minutes unbilled)  Fwd walking in hallway with trunk rotation holding ball, Bil lateral ball laterally, ball toss vertically, bouncing x 10 min total; Stepping over obstacles (low hurdle (side stepping x8, forward/back stepping x5)) in // bars with no UE support and dual task conversing with therapist. min A for balance throughout; Toe taps from foam pad: Up to step alternating Bil LE x10, randomized tapping to cones anteriorly/ crossbody/ipsilaterally and with sequential tapping up to 2 cones. Min>modA throughout.  Rockerboard AP: Maintaining balance x2 mins, eyes open, CGA; x1min with eyes closed, up to minA; anterior weight shifts rocking onto forefoot  Rockerboard L/R: Maintaining balance x1 min   with eyes open and CGA. L/R weight shifting with eyes open x1 min and CGA.                   PT Education - 11/15/15 1459    Education provided Yes   Education Details Reinforced HEP   Person(s) Educated Patient   Methods Explanation   Comprehension Verbalized understanding             PT Long Term Goals - 10/25/15 1605      PT LONG TERM GOAL #1   Title pt will improve Berg balance score by 6pts to reduce fall risk    Time 8   Period Weeks   Status Achieved     PT LONG TERM  GOAL #2   Title pt will improve 10m walk speed to 1.2 m/s to improve her community mobility    Baseline 0.84 m/s with SPC, 0.78 with no AD (10/25/15)   Time 8   Period Weeks   Status Partially Met     PT LONG TERM GOAL #3   Title pt will improve 5xsit to stand time to <14.2m/s demonstrating improved LE strength   Time 8   Period Weeks   Status Achieved     PT LONG TERM GOAL #4   Title pt will improve ankle strength to 4/5 to improve her static standing balance in the shower.    Time 8   Period Weeks   Status On-going     PT LONG TERM GOAL #5   Title pt will be able to demonstrate proper lifting technique 3/5 times in order to demonstrate improved balance and decrease risk fo falls.    Time 4   Period Weeks   Status On-going               Plan - 11/15/15 1508    Clinical Impression Statement Pt demonstrates lateral veering during gait with head turning activities. She also struggles with foam balance cone taps and rockerboard weight shifting. Pt does becomes SOB intermittently throughout session and provided intermittent rest breaks. Pt will benefit from skilled PT services to improve balance and decrease fall risk.    Rehab Potential Good   Clinical Impairments Affecting Rehab Potential neuropathy, chronic LBP, hx of CA, cardiac history, fatigue following CA treatment    PT Frequency 2x / week   PT Duration 4 weeks   PT Treatment/Interventions Aquatic Therapy;Gait training;Stair training;Therapeutic activities;Therapeutic exercise;Balance training;Neuromuscular re-education;Patient/family education;Cryotherapy;Vestibular   PT Next Visit Plan Airex balance, cone taps, head turns with ambulation, LE strengthening   PT Home Exercise Plan see pt instructions tab above   Consulted and Agree with Plan of Care Patient      Patient will benefit from skilled therapeutic intervention in order to improve the following deficits and impairments:  Decreased strength, Abnormal gait,  Impaired flexibility, Decreased activity tolerance, Decreased balance, Decreased range of motion, Impaired sensation, Pain, Decreased endurance  Visit Diagnosis: Unsteadiness on feet  Muscle weakness (generalized)     Problem List Patient Active Problem List   Diagnosis Date Noted  . Absence of bladder continence 07/21/2015  . Neuropathy (HCC) 06/02/2015  . Postoperative state 04/08/2015  . Carcinoma of endometrium (HCC) 04/02/2015  . Endometrial cancer (HCC) 03/24/2015  . Gastro-esophageal reflux disease without esophagitis 02/15/2015  . Osteopenia 08/17/2014  . Hyperlipidemia 02/13/2014  . Kidney stones 10/27/2013  . CA skin, basal cell 06/30/2013  . Narrowing of intervertebral disc space 06/30/2013  . Accumulation of fluid in   tissues 06/30/2013  . Benign essential HTN 06/30/2013  . Calculus of kidney 06/30/2013  . Lumbar canal stenosis 06/30/2013  . Arthritis, degenerative 06/30/2013  . Neuralgia neuritis, sciatic nerve 06/30/2013  . Edema 06/30/2013  . Heart murmur 06/30/2013  . Hematuria 12/19/2012  . CAD (coronary artery disease)- elective CFX DES 10/24/12 for recurrent angina 10/25/2012  . Dyslipidemia 10/25/2012  . NSVT (nonsustained ventricular tachycardia) ([place]) 10/25/2012  . Bradycardia- tolerating low dose beta blocker 10/25/2012  . Cardiomyopathy, ischemic: EF 35-40% by echo post-MI 10/20/12 10/22/2012  . STEMI 10/19/12- LAD DES, and CFX DES 10/24/12 10/20/2012  . HTN (hypertension) 10/20/2012   Phillips Grout PT, DPT   Huprich,Jason 11/15/2015, 3:14 PM  [place] MAIN [facility] SERVICES [address] [place], [place], [address] Phone: [phone]   Fax:  [phone]  Name: Susan [name] MRN: [id number] Date of Birth: 1934/10/14

## 2015-11-17 ENCOUNTER — Ambulatory Visit: Payer: Medicare Other

## 2015-11-17 VITALS — BP 119/55 | HR 65

## 2015-11-17 DIAGNOSIS — R2681 Unsteadiness on feet: Secondary | ICD-10-CM | POA: Diagnosis not present

## 2015-11-17 DIAGNOSIS — M6281 Muscle weakness (generalized): Secondary | ICD-10-CM

## 2015-11-17 NOTE — Therapy (Signed)
[place] MAIN [facility] SERVICES [address] [place], [place], [address] Phone: [phone]   Fax:  [phone]  Physical Therapy Treatment  Patient Details  Name: Susan [name] MRN: [id number] Date of Birth: [birth year] Referring Provider: Jeb Levering  Encounter Date: 11/17/2015      PT End of Session - 11/17/15 1321    Visit Number 18   Number of Visits 25   Date for PT Re-Evaluation 12/20/15   Authorization Type 8/10   PT Start Time 1315   PT Stop Time 1404   PT Time Calculation (min) 49 min   Equipment Utilized During Treatment Gait belt   Activity Tolerance Patient tolerated treatment well   Behavior During Therapy [facility] for tasks assessed/performed      Past Medical History:  Diagnosis Date  . Bradycardia   . Coronary artery disease   . Heart murmur   . Hyperlipidemia   . Hypertension   . Myocardial infarction ([place])   . NSVT (nonsustained ventricular tachycardia) ([place])   . Uterine cancer ([place])    serrous    Past Surgical History:  Procedure Laterality Date  . ABDOMINAL HYSTERECTOMY    . LEFT HEART CATHETERIZATION WITH CORONARY ANGIOGRAM  10/19/2012   Procedure: LEFT HEART CATHETERIZATION WITH CORONARY ANGIOGRAM;  Surgeon: Troy Sine, MD;  Location: [facility] CATH LAB;  Service: Cardiovascular;;  . PERCUTANEOUS CORONARY STENT INTERVENTION (PCI-S)  10/19/2012   Procedure: PERCUTANEOUS CORONARY STENT INTERVENTION (PCI-S);  Surgeon: Troy Sine, MD;  Location: [facility] CATH LAB;  Service: Cardiovascular;;  . PERCUTANEOUS CORONARY STENT INTERVENTION (PCI-S) N/A 10/23/2012   Procedure: PERCUTANEOUS CORONARY STENT INTERVENTION (PCI-S);  Surgeon: Troy Sine, MD;  Location: [facility] CATH LAB;  Service: Cardiovascular;  Laterality: N/A;  . PERIPHERAL VASCULAR CATHETERIZATION N/A 05/05/2015   Procedure: Glori Luis Cath Insertion;  Surgeon: Algernon Huxley, MD;  Location: [place] CV LAB;  Service: Cardiovascular;  Laterality: N/A;    Vitals:    11/17/15 1319  BP: (!) 119/55  Pulse: 65  SpO2: 100%        Subjective Assessment - 11/17/15 1320    Subjective Pt reports she is doing well on this date. Reports continued L shoulder pain and plans to discuss with MD and schedule with orthopedist. No specific questions or concerns.    Pertinent History chronic LBP, uterine CA, hx of MI   Patient Stated Goals Improve balance, walk without cane    Currently in Pain? Yes   Pain Score 4    Pain Location Shoulder   Pain Orientation Left   Pain Descriptors / Indicators Aching   Pain Type Chronic pain   Pain Onset More than a month ago   Multiple Pain Sites No           TREATMENT  THER-EX Nustep L4 x 5 min (unbilled) Resisted walking forward, sidestepping 7.5# x 3 laps each with obstacles to step over and 5" step-up at the end, increased difficulty with R lateral walking, 2 small LOB but pt able to maintain balance with CGA,  Obstacle course with step-overs and 5" step Forward and side BOSU lunges x 10 with each lower extremity;  NEUROMUSCULAR RE-EDUCATION Rocker board balance static A/P and R/L x 30 seconds each; Rocker board balance mini squats A/P and R/L x 10 each; Airex balance with feet apart with cone stacking changing height from waist to overhead, crossing midline intermittently and challenging forward reach;  Walking with dynadisc stepping followed by obstacle course with dynadisc  and Airex pads. CGA and cues required for safe performance.                       PT Education - 11/17/15 1321    Education provided Yes   Education Details Reinforced HEP   Person(s) Educated Patient   Methods Explanation   Comprehension Verbalized understanding             PT Long Term Goals - 10/25/15 1605      PT LONG TERM GOAL #1   Title pt will improve Berg balance score by 6pts to reduce fall risk    Time 8   Period Weeks   Status Achieved     PT LONG TERM GOAL #2   Title pt will improve 36m walk speed to 1.2 m/s to improve her community mobility    Baseline 0.84 m/s with SPC, 0.78 with no AD (10/25/15)   Time 8   Period Weeks   Status Partially Met     PT LONG TERM GOAL #3   Title pt will improve 5xsit to stand time to <14.279m demonstrating improved LE strength   Time 8   Period Weeks   Status Achieved     PT LONG TERM GOAL #4   Title pt will improve ankle strength to 4/5 to improve her static standing balance in the shower.    Time 8   Period Weeks   Status On-going     PT LONG TERM GOAL #5   Title pt will be able to demonstrate proper lifting technique 3/5 times in order to demonstrate improved balance and decrease risk fo falls.    Time 4   Period Weeks   Status On-going               Plan - 11/17/15 1321    Clinical Impression Statement Pt demonstrates continued progress with balance training. She struggles with restisted gait requiring intermittent min to modA+1 to prevent LOB. Pt encouraged to continue HEP and follow-up as scheduled.    Rehab Potential Good   Clinical Impairments Affecting Rehab Potential neuropathy, chronic LBP, hx of CA, cardiac history, fatigue following CA treatment    PT Frequency 2x / week   PT Duration 4 weeks   PT Treatment/Interventions Aquatic Therapy;Gait training;Stair training;Therapeutic activities;Therapeutic exercise;Balance training;Neuromuscular re-education;Patient/family education;Cryotherapy;Vestibular   PT Next Visit Plan Airex balance, cone taps, head turns with ambulation, LE strengthening   PT Home Exercise Plan see pt instructions tab above   Consulted and Agree with Plan of Care Patient      Patient will benefit from skilled therapeutic intervention in order to improve the following deficits and impairments:  Decreased strength, Abnormal gait, Impaired flexibility, Decreased activity tolerance, Decreased balance, Decreased range of motion, Impaired sensation, Pain, Decreased endurance  Visit  Diagnosis: Unsteadiness on feet  Muscle weakness (generalized)     Problem List Patient Active Problem List   Diagnosis Date Noted  . Absence of bladder continence 07/21/2015  . Neuropathy ([facility]03/10/2015  . Postoperative state 04/08/2015  . Carcinoma of endometrium (HCUnion [place]01/08/2015  . Endometrial cancer (HCCull[place]12/28/2016  . Gastro-esophageal reflux disease without esophagitis 02/15/2015  . Osteopenia 08/17/2014  . Hyperlipidemia 02/13/2014  . Kidney stones 10/27/2013  . CA skin, basal cell 06/30/2013  . Narrowing of intervertebral disc space 06/30/2013  . Accumulation of fluid in tissues 06/30/2013  . Benign essential HTN 06/30/2013  . Calculus of kidney 06/30/2013  . Lumbar canal stenosis 06/30/2013  .  Arthritis, degenerative 06/30/2013  . Neuralgia neuritis, sciatic nerve 06/30/2013  . Edema 06/30/2013  . Heart murmur 06/30/2013  . Hematuria 12/19/2012  . CAD (coronary artery disease)- elective CFX DES 10/24/12 for recurrent angina 10/25/2012  . Dyslipidemia 10/25/2012  . NSVT (nonsustained ventricular tachycardia) ([place]) 10/25/2012  . Bradycardia- tolerating low dose beta blocker 10/25/2012  . Cardiomyopathy, ischemic: EF 35-40% by echo post-MI 10/20/12 10/22/2012  . STEMI 10/19/12- LAD DES, and CFX DES 10/24/12 10/20/2012  . HTN (hypertension) 10/20/2012   Phillips Grout PT, DPT   Huprich,Jason 11/17/2015, 2:28 PM  [place] MAIN [facility] SERVICES [address] [place], [place], [address] Phone: [phone]   Fax:  [phone]  Name: Susan [name] MRN: [id number] Date of Birth: 1934-08-30

## 2015-11-22 ENCOUNTER — Ambulatory Visit: Payer: Medicare Other

## 2015-11-22 VITALS — BP 130/59 | HR 54

## 2015-11-22 DIAGNOSIS — R2681 Unsteadiness on feet: Secondary | ICD-10-CM | POA: Diagnosis not present

## 2015-11-22 DIAGNOSIS — M6281 Muscle weakness (generalized): Secondary | ICD-10-CM

## 2015-11-22 NOTE — Therapy (Signed)
[place] MAIN [facility] SERVICES [address] [place], [place], [address] Phone: [phone]   Fax:  [phone]  Physical Therapy Treatment  Patient Details  Name: Susan [name] MRN: [id number] Date of Birth: 04-Oct-1934 Referring Provider: Jeb Levering  Encounter Date: 11/22/2015      PT End of Session - 11/22/15 1309    Visit Number 19   Number of Visits 25   Date for PT Re-Evaluation 12/20/15   Authorization Type 9/10   PT Start Time 1304   PT Stop Time 1345   PT Time Calculation (min) 41 min   Equipment Utilized During Treatment Gait belt   Activity Tolerance Patient tolerated treatment well   Behavior During Therapy [facility] for tasks assessed/performed      Past Medical History:  Diagnosis Date  . Bradycardia   . Coronary artery disease   . Heart murmur   . Hyperlipidemia   . Hypertension   . Myocardial infarction ([place])   . NSVT (nonsustained ventricular tachycardia) ([place])   . Uterine cancer ([place])    serrous    Past Surgical History:  Procedure Laterality Date  . ABDOMINAL HYSTERECTOMY    . LEFT HEART CATHETERIZATION WITH CORONARY ANGIOGRAM  10/19/2012   Procedure: LEFT HEART CATHETERIZATION WITH CORONARY ANGIOGRAM;  Surgeon: Troy Sine, MD;  Location: [facility] CATH LAB;  Service: Cardiovascular;;  . PERCUTANEOUS CORONARY STENT INTERVENTION (PCI-S)  10/19/2012   Procedure: PERCUTANEOUS CORONARY STENT INTERVENTION (PCI-S);  Surgeon: Troy Sine, MD;  Location: [facility] CATH LAB;  Service: Cardiovascular;;  . PERCUTANEOUS CORONARY STENT INTERVENTION (PCI-S) N/A 10/23/2012   Procedure: PERCUTANEOUS CORONARY STENT INTERVENTION (PCI-S);  Surgeon: Troy Sine, MD;  Location: [facility] CATH LAB;  Service: Cardiovascular;  Laterality: N/A;  . PERIPHERAL VASCULAR CATHETERIZATION N/A 05/05/2015   Procedure: Glori Luis Cath Insertion;  Surgeon: Algernon Huxley, MD;  Location: [place] CV LAB;  Service: Cardiovascular;  Laterality: N/A;    Vitals:   11/22/15 1305  BP: (!) 130/59  Pulse: (!) 54  SpO2: 100%        Subjective Assessment - 11/22/15 1305    Subjective Pt states she is doing well on this date. She states that she went out on Sunday afternoon to rake some leaves and got a little overheated. She went back inside and rehydrated and felt better. No specific questions or concerns.    Pertinent History chronic LBP, uterine CA, hx of MI   Patient Stated Goals Improve balance, walk without cane    Currently in Pain? Yes   Pain Score 3    Pain Location Shoulder   Pain Descriptors / Indicators Aching   Pain Type Chronic pain   Pain Onset More than a month ago   Pain Frequency Intermittent         TREATMENT  THER-EX Nustep L4 x 3 min, L5 x 2 min (unbilled) Sit to stand without UE support from regular height chair with Airex under feet 2 x 10, with rest break between sets; Forward and side BOSU lunges x 10 with each lower extremity;  NEUROMUSCULAR RE-EDUCATION Hallway ambulation while holding ball, tracking with eyes, and turning trunk L and R multiple 75' trials; Hallway ambulation while holding ball with vertical ball lifts following with head and eyes multiple 75' trials; Overhead cone stacking on shelf alternating UE, crossing midline, and varying height of cones x 5 minutes; Ambulation with quick 180 degree turns, first left, then right, followed by alternating directions; 1/2 foam balance with  flat side down static balance; 1/2 foam balance with flat side down with horizontal and then vertical head turns x 1 minute each; Dynadisc balance with UE reaching to challenge balance;                            PT Education - 11/22/15 1308    Education provided Yes   Education Details Reinforced HEP   Person(s) Educated Patient   Methods Explanation   Comprehension Verbalized understanding             PT Long Term Goals - 10/25/15 1605      PT LONG TERM GOAL #1   Title pt will  improve Berg balance score by 6pts to reduce fall risk    Time 8   Period Weeks   Status Achieved     PT LONG TERM GOAL #2   Title pt will improve 81mwalk speed to 1.2 m/s to improve her community mobility    Baseline 0.84 m/s with SPC, 0.78 with no AD (10/25/15)   Time 8   Period Weeks   Status Partially Met     PT LONG TERM GOAL #3   Title pt will improve 5xsit to stand time to <14.28m demonstrating improved LE strength   Time 8   Period Weeks   Status Achieved     PT LONG TERM GOAL #4   Title pt will improve ankle strength to 4/5 to improve her static standing balance in the shower.    Time 8   Period Weeks   Status On-going     PT LONG TERM GOAL #5   Title pt will be able to demonstrate proper lifting technique 3/5 times in order to demonstrate improved balance and decrease risk fo falls.    Time 4   Period Weeks   Status On-going               Plan - 11/22/15 1309    Clinical Impression Statement Pt continues to improve with her balance and strengthening. She is able to improve her sit to stand balance wth cues for anterior weight shifting. Pt with 1-2 step corrections with quick turns. Pt will continue to benefit from skilled PT services to address deficits in balance and strength in order to decrease risk for falls and function at home.    Rehab Potential Good   Clinical Impairments Affecting Rehab Potential neuropathy, chronic LBP, hx of CA, cardiac history, fatigue following CA treatment    PT Frequency 2x / week   PT Duration 4 weeks   PT Treatment/Interventions Aquatic Therapy;Gait training;Stair training;Therapeutic activities;Therapeutic exercise;Balance training;Neuromuscular re-education;Patient/family education;Cryotherapy;Vestibular   PT Next Visit Plan Airex balance, cone taps, head turns with ambulation, LE strengthening   PT Home Exercise Plan see pt instructions tab above   Consulted and Agree with Plan of Care Patient      Patient will  benefit from skilled therapeutic intervention in order to improve the following deficits and impairments:  Decreased strength, Abnormal gait, Impaired flexibility, Decreased activity tolerance, Decreased balance, Decreased range of motion, Impaired sensation, Pain, Decreased endurance  Visit Diagnosis: Unsteadiness on feet  Muscle weakness (generalized)     Problem List Patient Active Problem List   Diagnosis Date Noted  . Absence of bladder continence 07/21/2015  . Neuropathy (HCUn[place]03/10/2015  . Postoperative state 04/08/2015  . Carcinoma of endometrium (HCAlen[place]01/08/2015  . Endometrial cancer (HCCassel[place]12/28/2016  . Gastro-esophageal reflux disease  without esophagitis 02/15/2015  . Osteopenia 08/17/2014  . Hyperlipidemia 02/13/2014  . Kidney stones 10/27/2013  . CA skin, basal cell 06/30/2013  . Narrowing of intervertebral disc space 06/30/2013  . Accumulation of fluid in tissues 06/30/2013  . Benign essential HTN 06/30/2013  . Calculus of kidney 06/30/2013  . Lumbar canal stenosis 06/30/2013  . Arthritis, degenerative 06/30/2013  . Neuralgia neuritis, sciatic nerve 06/30/2013  . Edema 06/30/2013  . Heart murmur 06/30/2013  . Hematuria 12/19/2012  . CAD (coronary artery disease)- elective CFX DES 10/24/12 for recurrent angina 10/25/2012  . Dyslipidemia 10/25/2012  . NSVT (nonsustained ventricular tachycardia) ([place]) 10/25/2012  . Bradycardia- tolerating low dose beta blocker 10/25/2012  . Cardiomyopathy, ischemic: EF 35-40% by echo post-MI 10/20/12 10/22/2012  . STEMI 10/19/12- LAD DES, and CFX DES 10/24/12 10/20/2012  . HTN (hypertension) 10/20/2012   Phillips Grout PT, DPT   Masaichi Kracht 11/22/2015, 3:28 PM  [place] MAIN [facility] SERVICES [address] [place], [place], [address] Phone: [phone]   Fax:  [phone]  Name: MIKAEL DEBELL MRN: [id number] Date of Birth: Dec 04, 1934

## 2015-11-24 ENCOUNTER — Encounter: Payer: Self-pay | Admitting: Physical Therapy

## 2015-11-24 ENCOUNTER — Ambulatory Visit: Payer: Medicare Other | Admitting: Physical Therapy

## 2015-11-24 ENCOUNTER — Other Ambulatory Visit: Payer: Self-pay | Admitting: Cardiovascular Disease

## 2015-11-24 VITALS — BP 106/58 | HR 48

## 2015-11-24 DIAGNOSIS — M6281 Muscle weakness (generalized): Secondary | ICD-10-CM

## 2015-11-24 DIAGNOSIS — R2681 Unsteadiness on feet: Secondary | ICD-10-CM | POA: Diagnosis not present

## 2015-11-24 NOTE — Therapy (Signed)
[place] MAIN [facility] SERVICES [address] [place], [place], [address] Phone: [phone]   Fax:  [phone]  Physical Therapy Treatment  Patient Details  Name: Susan [name] MRN: [id number] Date of Birth: 09-27-1934 Referring Provider: Jeb Levering  Encounter Date: 11/24/2015      PT End of Session - 11/24/15 1352    Visit Number 20   Number of Visits 43   Date for PT Re-Evaluation 12/20/15   Authorization Type 10/10   PT Start Time 1300   PT Stop Time 1345   PT Time Calculation (min) 45 min   Equipment Utilized During Treatment Gait belt   Activity Tolerance Patient tolerated treatment well   Behavior During Therapy [facility] for tasks assessed/performed      Past Medical History:  Diagnosis Date  . Bradycardia   . Coronary artery disease   . Heart murmur   . Hyperlipidemia   . Hypertension   . Myocardial infarction ([place])   . NSVT (nonsustained ventricular tachycardia) ([place])   . Uterine cancer ([place])    serrous    Past Surgical History:  Procedure Laterality Date  . ABDOMINAL HYSTERECTOMY    . LEFT HEART CATHETERIZATION WITH CORONARY ANGIOGRAM  10/19/2012   Procedure: LEFT HEART CATHETERIZATION WITH CORONARY ANGIOGRAM;  Surgeon: Troy Sine, MD;  Location: [facility] CATH LAB;  Service: Cardiovascular;;  . PERCUTANEOUS CORONARY STENT INTERVENTION (PCI-S)  10/19/2012   Procedure: PERCUTANEOUS CORONARY STENT INTERVENTION (PCI-S);  Surgeon: Troy Sine, MD;  Location: [facility] CATH LAB;  Service: Cardiovascular;;  . PERCUTANEOUS CORONARY STENT INTERVENTION (PCI-S) N/A 10/23/2012   Procedure: PERCUTANEOUS CORONARY STENT INTERVENTION (PCI-S);  Surgeon: Troy Sine, MD;  Location: [facility] CATH LAB;  Service: Cardiovascular;  Laterality: N/A;  . PERIPHERAL VASCULAR CATHETERIZATION N/A 05/05/2015   Procedure: Glori Luis Cath Insertion;  Surgeon: Algernon Huxley, MD;  Location: [place] CV LAB;  Service: Cardiovascular;  Laterality: N/A;    Vitals:   11/24/15 1304  BP: (!) 106/58  Pulse: (!) 48  SpO2: 100%        Subjective Assessment - 11/24/15 1302    Subjective Pt reports she is doing well on this date and is not doing any exercises at home but is doing her water aerobics every Tuesday and Thursday (pt is the aerobics teacher).  Denies any pain.  Is excited to go to [facility] next week. Says she does not have any appointments scheduled for after her trip and we discussed setting these up on her way out.   Pertinent History chronic LBP, uterine CA, hx of MI   Patient Stated Goals Improve balance, walk without cane    Currently in Pain? No/denies   Pain Onset More than a month ago       TREATMENT  Therapeutic Exercise Ambulating in hallway with ball toss and head turn Bil with verbal commands for sudden stops  Ambulating with ball toss up and bounce  Backward walking  Tandem walking with up to min assist to steady  Sideways walking with RTB around knees x3 laps  Sit to stand without UE support from regular height chair with Airex under feet 2 x 10, with rest break between sets  Tandem forward/sideways walking on airex x3 laps each direction         PT Education - 11/24/15 1337    Education provided Yes   Education Details Exercise technique, safety with HEP   Person(s) Educated Patient   Methods Explanation;Demonstration;Verbal cues   Comprehension  Verbalized understanding;Returned demonstration             PT Long Term Goals - 10/25/15 1605      PT LONG TERM GOAL #1   Title pt will improve Berg balance score by 6pts to reduce fall risk    Time 8   Period Weeks   Status Achieved     PT LONG TERM GOAL #2   Title pt will improve 57mwalk speed to 1.2 m/s to improve her community mobility    Baseline 0.84 m/s with SPC, 0.78 with no AD (10/25/15)   Time 8   Period Weeks   Status Partially Met     PT LONG TERM GOAL #3   Title pt will improve 5xsit to stand time to <14.238m demonstrating improved LE  strength   Time 8   Period Weeks   Status Achieved     PT LONG TERM GOAL #4   Title pt will improve ankle strength to 4/5 to improve her static standing balance in the shower.    Time 8   Period Weeks   Status On-going     PT LONG TERM GOAL #5   Title pt will be able to demonstrate proper lifting technique 3/5 times in order to demonstrate improved balance and decrease risk fo falls.    Time 4   Period Weeks   Status On-going               Plan - [id number]    Clinical Impression Statement Pt is making good progress and no complaints today.  Will bring her HEP handouts with her next session.  Pt will benefit from additional exercises focused on balance and strengthening interventions.   Rehab Potential Good   Clinical Impairments Affecting Rehab Potential neuropathy, chronic LBP, hx of CA, cardiac history, fatigue following CA treatment    PT Frequency 2x / week   PT Duration 4 weeks   PT Treatment/Interventions Aquatic Therapy;Gait training;Stair training;Therapeutic activities;Therapeutic exercise;Balance training;Neuromuscular re-education;Patient/family education;Cryotherapy;Vestibular   PT Next Visit Plan Outcome Measures and update goals; Airex balance, cone taps, head turns with ambulation, LE strengthening.  Review HEP with pt and update as appropriate.   PT Home Exercise Plan Review next session and update, pt to bring HEP handouts next session   Consulted and Agree with Plan of Care Patient      Patient will benefit from skilled therapeutic intervention in order to improve the following deficits and impairments:  Decreased strength, Abnormal gait, Impaired flexibility, Decreased activity tolerance, Decreased balance, Decreased range of motion, Impaired sensation, Pain, Decreased endurance  Visit Diagnosis: Unsteadiness on feet  Muscle weakness (generalized)       G-Codes - 08Sep 17, 2017646    Functional Assessment Tool Used Clinical Judgement   Functional  Limitation Mobility: Walking and moving around   Mobility: Walking and Moving Around Current Status ([id number] least 20 percent but less than 40 percent impaired, limited or restricted   Mobility: Walking and Moving Around Goal Status ([id number] least 20 percent but less than 40 percent impaired, limited or restricted      Problem List Patient Active Problem List   Diagnosis Date Noted  . Absence of bladder continence 07/21/2015  . Neuropathy ([place]03/10/2015  . Postoperative state 04/08/2015  . Carcinoma of endometrium ([place]01/08/2015  . Endometrial cancer ([place]12/28/2016  . Gastro-esophageal reflux disease without esophagitis 02/15/2015  . Osteopenia 08/17/2014  . Hyperlipidemia 02/13/2014  . Kidney stones 10/27/2013  . CA  skin, basal cell 06/30/2013  . Narrowing of intervertebral disc space 06/30/2013  . Accumulation of fluid in tissues 06/30/2013  . Benign essential HTN 06/30/2013  . Calculus of kidney 06/30/2013  . Lumbar canal stenosis 06/30/2013  . Arthritis, degenerative 06/30/2013  . Neuralgia neuritis, sciatic nerve 06/30/2013  . Edema 06/30/2013  . Heart murmur 06/30/2013  . Hematuria 12/19/2012  . CAD (coronary artery disease)- elective CFX DES 10/24/12 for recurrent angina 10/25/2012  . Dyslipidemia 10/25/2012  . NSVT (nonsustained ventricular tachycardia) ([place]) 10/25/2012  . Bradycardia- tolerating low dose beta blocker 10/25/2012  . Cardiomyopathy, ischemic: EF 35-40% by echo post-MI 10/20/12 10/22/2012  . STEMI 10/19/12- LAD DES, and CFX DES 10/24/12 10/20/2012  . HTN (hypertension) 10/20/2012    Collie Siad PT, DPT 11/24/2015, 5:14 PM  [place] MAIN [facility] SERVICES [address] [place], [place], [address] Phone: [phone]   Fax:  [phone]  Name: Susan [name] MRN: [id number] Date of Birth: November 03, 1934

## 2015-11-30 ENCOUNTER — Encounter: Payer: Self-pay | Admitting: Physician Assistant

## 2015-11-30 ENCOUNTER — Ambulatory Visit (INDEPENDENT_AMBULATORY_CARE_PROVIDER_SITE_OTHER): Payer: Medicare Other | Admitting: Physician Assistant

## 2015-11-30 VITALS — BP 115/68 | HR 55 | Ht 64.0 in | Wt 196.2 lb

## 2015-11-30 DIAGNOSIS — I255 Ischemic cardiomyopathy: Secondary | ICD-10-CM

## 2015-11-30 DIAGNOSIS — I2510 Atherosclerotic heart disease of native coronary artery without angina pectoris: Secondary | ICD-10-CM

## 2015-11-30 DIAGNOSIS — I4729 Other ventricular tachycardia: Secondary | ICD-10-CM

## 2015-11-30 DIAGNOSIS — I472 Ventricular tachycardia: Secondary | ICD-10-CM

## 2015-11-30 DIAGNOSIS — R001 Bradycardia, unspecified: Secondary | ICD-10-CM

## 2015-11-30 NOTE — Progress Notes (Signed)
Cardiology Office Note   Date:  11/30/2015   ID:  Susan [name], DOB 1934/03/31, MRN [id number]  PCP:  [name], MD  Cardiologist:  Dr Meredeth Ide, PA-C   Chief Complaint  Patient presents with  . Follow-up    6 month f/u    History of Present Illness: Susan [name] is a 81 y.o. female with a history of STEMI 2014 w/ DES x 2 LAD, staged DES CFX, post-MI  MV 2014 w/ EF 66% and scar, no ischemia, MV 2015 w/ scar distal LAD, EF 48%. LDL particles 1352, Zetia added to Lipitor, hx kidney stones requiring lithotripsy. NSVT on monitor no BB 2nd bradycardia. OSA on CPAP. Dx endometrial CA w/ chemo>>neuropathy>>XRT  Susan [name] presents for A six-month follow-up.  She is actually doing quite well. 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 She continued this during chemotherapy, only missing a couple of classes. She does not get any chest pain with exertion. She does not feel any new dyspnea on exertion.  She is getting much better rest than previously because she is compliant with the CPAP.  She had side effects from the chemotherapy including neuropathy. She still has numbness in her fingertips, feet and legs. She walks with a cane because of this. She is doing physical therapy in hopes that it will get better.  She never has dizziness, lightheaded feeling, presyncope, or palpitations. She is aware that her heart rate is slow because it has always been low. She does not have any symptoms that she feels her from a low heart rate.   Past Medical History:  Diagnosis Date  . Bradycardia   . Coronary artery disease   . Heart murmur   . Hyperlipidemia   . Hypertension   . Myocardial infarction ([place])   . NSVT (nonsustained ventricular tachycardia) ([place])   . Uterine cancer ([place])    serrous    Past Surgical History:  Procedure Laterality Date  . ABDOMINAL HYSTERECTOMY    . LEFT HEART CATHETERIZATION WITH CORONARY ANGIOGRAM   10/19/2012   Procedure: LEFT HEART CATHETERIZATION WITH CORONARY ANGIOGRAM;  Surgeon: Troy Sine, MD;  Location: [facility] CATH LAB;  Service: Cardiovascular;;  . PERCUTANEOUS CORONARY STENT INTERVENTION (PCI-S)  10/19/2012   Procedure: PERCUTANEOUS CORONARY STENT INTERVENTION (PCI-S);  Surgeon: Troy Sine, MD;  Location: [facility] CATH LAB;  Service: Cardiovascular;;  . PERCUTANEOUS CORONARY STENT INTERVENTION (PCI-S) N/A 10/23/2012   Procedure: PERCUTANEOUS CORONARY STENT INTERVENTION (PCI-S);  Surgeon: Troy Sine, MD;  Location: [facility] CATH LAB;  Service: Cardiovascular;  Laterality: N/A;  . PERIPHERAL VASCULAR CATHETERIZATION N/A 05/05/2015   Procedure: Glori Luis Cath Insertion;  Surgeon: Algernon Huxley, MD;  Location: [place] CV LAB;  Service: Cardiovascular;  Laterality: N/A;    Current Outpatient Prescriptions  Medication Sig Dispense Refill  . aspirin EC 81 MG tablet Take 81 mg by mouth daily.    [name] atorvastatin (LIPITOR) 80 MG tablet TAKE 1 TABLET BY MOUTH AT 6PM 30 tablet 6  . ezetimibe (ZETIA) 10 MG tablet Take 1 tablet (10 mg total) by mouth daily. 30 tablet 11  . furosemide (LASIX) 20 MG tablet TAKE 1 TABLET BY MOUTH DAILY. 90 tablet 2  . gabapentin (NEURONTIN) 100 MG capsule Take 100 mg by mouth 2 (two) times daily.    . isosorbide mononitrate (IMDUR) 30 MG 24 hr tablet Take 1 tablet (30 mg total) by mouth daily. 30 tablet 11  .  lidocaine-prilocaine (EMLA) cream Apply to affected area once 30 g 3  . loperamide (IMODIUM A-D) 2 MG tablet Take 2 mg by mouth 4 (four) times daily as needed for diarrhea or loose stools.    [name] losartan (COZAAR) 50 MG tablet Take 1 tablet (50 mg total) by mouth daily. KEEP OV. 90 tablet 0  . Multiple Vitamin (MULTIVITAMIN WITH MINERALS) TABS Take 1 tablet by mouth daily.    . nitroGLYCERIN (NITROSTAT) 0.4 MG SL tablet Place 1 tablet (0.4 mg total) under the tongue every 5 (five) minutes x 3 doses as needed for chest pain. 25 tablet 3  . omeprazole (PRILOSEC) 20 MG  capsule Take 20 mg by mouth daily.    [name] pyridOXINE (VITAMIN B-6) 100 MG tablet Take 100 mg by mouth 2 (two) times daily.    . ticagrelor (BRILINTA) 60 MG TABS tablet Take 1 tablet (60 mg total) by mouth 2 (two) times daily. Please keep your upcoming appointment (11/28/15) for refills. 60 tablet 5  . Vitamin D, Cholecalciferol, 1000 UNITS CAPS Take 1 capsule by mouth daily.     No current facility-administered medications for this visit.     Allergies:   Ace inhibitors and Penicillins    Social History:  The patient  reports that she has never smoked. She has never used smokeless tobacco. She reports that she drinks about 0.6 oz of alcohol per week . She reports that she does not use drugs.   Family History:  The patient's family history includes Breast cancer in her mother; Diabetes in her brother and father; Heart disease in her father and mother; Stroke in her brother.    ROS:  Please see the history of present illness. All other systems are reviewed and negative.    PHYSICAL EXAM: VS:  BP 115/68   Pulse (!) 55   Ht 5\' 4"  (1.626 m)   Wt 196 lb 3.2 oz (89 kg)   BMI 33.68 kg/m  , BMI Body mass index is 33.68 kg/m. GEN: Well nourished, well developed, female in no acute distress  HEENT: normal for age  Neck: no JVD, no carotid bruit, no masses Cardiac: RRR; soft murmur, no rubs, or gallops Respiratory:  clear to auscultation bilaterally, normal work of breathing GI: soft, nontender, nondistended, + BS MS: no deformity or atrophy; no edema; distal pulses are 2+ in all 4 extremities   Skin: warm and dry, no rash Neuro:  Strength and sensation are intact Psych: euthymic mood, full affect   EKG:  EKG is ordered today. The ekg ordered today demonstrates sinus bradycardia, heart rate 55 with a first-degree AV block at 294 ms, no significant change from 2016   Recent Labs: 07/14/2015: Magnesium 2.2 10/21/2015: ALT 17; BUN 25; Creatinine, Ser 0.84; Hemoglobin 11.6; Platelets 195;  Potassium 3.4; Sodium 139    Lipid Panel    Component Value Date/Time   CHOL 129 06/18/2015 1015   CHOL 141 04/16/2013 0924   TRIG 125 06/18/2015 1015   TRIG 112 04/16/2013 0924   HDL 61 06/18/2015 1015   HDL 53 04/16/2013 0924   CHOLHDL 2.1 06/18/2015 1015   VLDL 25 06/18/2015 1015   LDLCALC 43 06/18/2015 1015   LDLCALC 66 04/16/2013 0924     Wt Readings from Last 3 Encounters:  11/30/15 196 lb 3.2 oz (89 kg)  10/21/15 192 lb 7.4 oz (87.3 kg)  10/20/15 191 lb 12.8 oz (87 kg)     Other studies Reviewed: Additional studies/ records that were  reviewed today include: Office notes and testing.  ASSESSMENT AND PLAN:  1.  CAD: She is maintaining a good activity level and not having any ischemic symptoms on medical therapy including aspirin, Brilinta, statin, Zetia, Imdur, and Cozaar. Continue current therapy.  2. Sinus bradycardia: She is asymptomatic with this. Since she has been on the CPAP, her heart rate has not been as low at night. She is not on any rate lowering medications.  3. Nonsustained VT: She's having no known symptoms from this. Her EF was minimally decreased in 2015 At a Myoview (48%) no further evaluation or treatment is needed.   Current medicines are reviewed at length with the patient today.  The patient does not have concerns regarding medicines.  The following changes have been made:  no change  Labs/ tests ordered today include:   Orders Placed This Encounter  Procedures  . COMPLETE METABOLIC PANEL WITH GFR  . Lipid panel  . EKG 12-Lead     Disposition:   FU with Dr. Claiborne Billings, check lipid profile and cholesterol testing at that time.  Augusto Garbe  11/30/2015 [facility] Phone: [phone]; Fax: [phone]  This note was written with the assistance of speech recognition software. Please excuse any transcriptional errors.

## 2015-11-30 NOTE — Patient Instructions (Signed)
Medications:  Your physician recommends that you continue on your current medications as directed. Please refer to the Current Medication list given to you today.   Labwork:  Your physician recommends that you return for lab work in: March 2018, when you come back for your follow-up with Dr. Claiborne Billings    Follow-Up:  You currently have a recall in the system for follow-up in sleep clinic with Dr. Claiborne Billings set for March 2018. Please call to schedule this appointment.  If you need a refill on your cardiac medications before your next appointment, please call your pharmacy.

## 2015-12-06 ENCOUNTER — Ambulatory Visit: Payer: Medicare Other | Admitting: Physical Therapy

## 2015-12-06 ENCOUNTER — Ambulatory Visit: Payer: Medicare Other

## 2015-12-08 ENCOUNTER — Ambulatory Visit
Admission: RE | Admit: 2015-12-08 | Discharge: 2015-12-08 | Disposition: A | Discharge status: Home / Self Care | Payer: Medicare Other | Source: Ambulatory Visit | Attending: Radiation Oncology | Admitting: Radiation Oncology

## 2015-12-08 ENCOUNTER — Inpatient Hospital Stay: Payer: Medicare Other | Attending: Hematology and Oncology

## 2015-12-08 ENCOUNTER — Encounter: Payer: Self-pay | Admitting: Radiation Oncology

## 2015-12-08 VITALS — BP 113/74 | HR 56 | Temp 96.2°F | Resp 20 | Wt 195.8 lb

## 2015-12-08 DIAGNOSIS — Z95828 Presence of other vascular implants and grafts: Secondary | ICD-10-CM

## 2015-12-08 DIAGNOSIS — C541 Malignant neoplasm of endometrium: Secondary | ICD-10-CM | POA: Diagnosis not present

## 2015-12-08 DIAGNOSIS — Z923 Personal history of irradiation: Secondary | ICD-10-CM | POA: Insufficient documentation

## 2015-12-08 DIAGNOSIS — C775 Secondary and unspecified malignant neoplasm of intrapelvic lymph nodes: Secondary | ICD-10-CM | POA: Insufficient documentation

## 2015-12-08 DIAGNOSIS — Z90710 Acquired absence of both cervix and uterus: Secondary | ICD-10-CM | POA: Insufficient documentation

## 2015-12-08 DIAGNOSIS — Z9221 Personal history of antineoplastic chemotherapy: Secondary | ICD-10-CM | POA: Diagnosis not present

## 2015-12-08 DIAGNOSIS — Z452 Encounter for adjustment and management of vascular access device: Secondary | ICD-10-CM | POA: Diagnosis not present

## 2015-12-08 DIAGNOSIS — Z8542 Personal history of malignant neoplasm of other parts of uterus: Secondary | ICD-10-CM | POA: Diagnosis not present

## 2015-12-08 MED ORDER — SODIUM CHLORIDE 0.9% FLUSH
10.0000 mL | INTRAVENOUS | Status: DC | PRN
  Administered 2015-12-08: 10 mL via INTRAVENOUS
  Filled 2015-12-08: qty 10

## 2015-12-08 MED ORDER — HEPARIN SOD (PORK) LOCK FLUSH 100 UNIT/ML IV SOLN
500.0000 [IU] | Freq: Once | INTRAVENOUS | Status: AC
  Administered 2015-12-08: 500 [IU] via INTRAVENOUS

## 2015-12-08 NOTE — Progress Notes (Signed)
Radiation Oncology Follow up Note  Name: Susan [name]   Date:   12/08/2015 MRN:  [id number] DOB: 12/10/1934    This 81 y.o. female presents to the clinic today for one-month follow-up status post radiation therapy for endometrial cancer.  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 She is seen today in routine follow-up and is doing fairly well continues to have some frequent bowel movements not necessarily diarrhea somewhat mucousy. She is not having any significant lower urinary tract symptoms. She recently had a pelvic examination which was unremarkable. She specifically denies any vaginal bleeding or pelvic pain..  COMPLICATIONS OF TREATMENT: none  FOLLOW UP COMPLIANCE: keeps appointments   PHYSICAL EXAM:  BP 113/74   Pulse (!) 56   Temp (!) 96.2 F (35.7 C)   Resp 20   Wt 195 lb 12.3 oz (88.8 kg)   BMI 33.60 kg/m  Well-developed well-nourished patient in NAD. HEENT reveals PERLA, EOMI, discs not visualized.  Oral cavity is clear. No oral mucosal lesions are identified. Neck is clear without evidence of cervical or supraclavicular adenopathy. Lungs are clear to A&P. Cardiac examination is essentially unremarkable with regular rate and rhythm without murmur rub or thrill. Abdomen is benign with no organomegaly or masses noted. Motor sensory and DTR levels are equal and symmetric in the upper and lower extremities. Cranial nerves II through XII are grossly intact. Proprioception is intact. No peripheral adenopathy or edema is identified. No motor or sensory levels are noted. Crude visual fields are within normal range.  RADIOLOGY RESULTS: No current films for review  PLAN: Present time she is doing well with no evidence of disease. I've suggested some activity a  yogurt to try to control her bowel movements. She's not taking Imodium or any lower residue diet at this time. She continues close follow-up care with GYN oncology and will have pelvic exam in the next 2 months. I have asked to see around 6 months for follow-up. Patient knows to call with any concerns.  I would like to take this opportunity to thank you for allowing me to participate in the care of your patient.Armstead Peaks., MD

## 2015-12-09 ENCOUNTER — Encounter: Payer: Self-pay | Admitting: Physical Therapy

## 2015-12-09 ENCOUNTER — Ambulatory Visit: Payer: Medicare Other | Attending: Hematology and Oncology | Admitting: Physical Therapy

## 2015-12-09 VITALS — BP 114/62 | HR 55

## 2015-12-09 DIAGNOSIS — R2681 Unsteadiness on feet: Secondary | ICD-10-CM | POA: Insufficient documentation

## 2015-12-09 DIAGNOSIS — M6281 Muscle weakness (generalized): Secondary | ICD-10-CM | POA: Diagnosis present

## 2015-12-09 NOTE — Therapy (Signed)
[place] MAIN [facility] SERVICES [address] [place], [place], [address] Phone: [phone]   Fax:  [phone]  Physical Therapy Treatment  Patient Details  Name: Susan [name] MRN: [id number] Date of Birth: 12/22/1934 Referring Provider: Jeb Levering  Encounter Date: 12/09/2015      PT End of Session - 12/09/15 1605    Visit Number 21   Number of Visits 43   Date for PT Re-Evaluation 12/20/15   Authorization Type 1/10   PT Start Time 0051   PT Stop Time 1600   PT Time Calculation (min) 44 min   Equipment Utilized During Treatment Gait belt   Activity Tolerance Patient tolerated treatment well   Behavior During Therapy [facility] for tasks assessed/performed      Past Medical History:  Diagnosis Date  . Bradycardia   . Coronary artery disease   . Heart murmur   . Hyperlipidemia   . Hypertension   . Myocardial infarction ([place])   . NSVT (nonsustained ventricular tachycardia) ([place])   . Uterine cancer ([place])    serrous    Past Surgical History:  Procedure Laterality Date  . ABDOMINAL HYSTERECTOMY    . LEFT HEART CATHETERIZATION WITH CORONARY ANGIOGRAM  10/19/2012   Procedure: LEFT HEART CATHETERIZATION WITH CORONARY ANGIOGRAM;  Surgeon: Troy Sine, MD;  Location: [facility] CATH LAB;  Service: Cardiovascular;;  . PERCUTANEOUS CORONARY STENT INTERVENTION (PCI-S)  10/19/2012   Procedure: PERCUTANEOUS CORONARY STENT INTERVENTION (PCI-S);  Surgeon: Troy Sine, MD;  Location: [facility] CATH LAB;  Service: Cardiovascular;;  . PERCUTANEOUS CORONARY STENT INTERVENTION (PCI-S) N/A 10/23/2012   Procedure: PERCUTANEOUS CORONARY STENT INTERVENTION (PCI-S);  Surgeon: Troy Sine, MD;  Location: [facility] CATH LAB;  Service: Cardiovascular;  Laterality: N/A;  . PERIPHERAL VASCULAR CATHETERIZATION N/A 05/05/2015   Procedure: Glori Luis Cath Insertion;  Surgeon: Algernon Huxley, MD;  Location: [place] CV LAB;  Service: Cardiovascular;  Laterality: N/A;    Vitals:   12/09/15 1521  BP: 114/62  Pulse: (!) 55  SpO2: 100%        Subjective Assessment - 12/09/15 1523    Subjective Pt went to [facility] last week and did a lot of walking, taking breaks at times due to fatigue.  Denies any falls since last session.  Still having trouble clearing L foot when ascending steps.  Did not complete her HEP every day while on vacation.   Pertinent History chronic LBP, uterine CA, hx of MI   Patient Stated Goals Improve balance, walk without cane    Currently in Pain? No/denies       TREATMENT  Completed Outcomes Measures and explained results to pt: 10MWT with SPC 1.07 m/s 5xSTS 12.13 seconds  Therapeutic Exercise:  Ankle DF strength testing with MMT (4-/5 on R, 3/5 on L)  Ambulating in hallway with ball toss and head turn Bil  Ambulating in hallway with spontaneous commands for Backward/Forward/Left/Right walking (6 minutes)  Seated Bil DF with YTB 3x15 each side  Neuromuscular Rehab:  Tandem walking on airex , pt reaching out for // bars for support  Tandem and sideways walking on 1/2 foam roll , pt reaching out for // bars for support  Stepping over stones in // bars with cues for DF  Alternating toe taps up to 8" step from airex, 2x20                     PT Education - 12/09/15 1526    Education provided Yes  Education Details Exercise technique; Added DF exercise to HEP and provided pt with handout   Person(s) Educated Patient   Methods Explanation;Demonstration;Verbal cues;Handout   Comprehension Verbalized understanding;Returned demonstration             PT Long Term Goals - 01-02-2016 1607      PT LONG TERM GOAL #1   Title pt will improve Berg balance score by 6pts to reduce fall risk    Time 8   Period Weeks   Status Achieved     PT LONG TERM GOAL #2   Title pt will improve 3mwalk speed to 1.2 m/s to improve her community mobility    Baseline 0.84 m/s with SPC, 0.78 with no AD (10/25/15), 1.07 m/s (910/08/17   Time  8   Period Weeks   Status Partially Met     PT LONG TERM GOAL #3   Title pt will improve 5xsit to stand time to <14.280m demonstrating improved LE strength   Baseline 12.13 seconds on 9/Jan 02, 2016 Time 8   Period Weeks   Status Achieved     PT LONG TERM GOAL #4   Title pt will improve ankle strength to 4/5 to improve her static standing balance in the shower.    Time 8   Period Weeks   Status On-going     PT LONG TERM GOAL #5   Title pt will be able to demonstrate proper lifting technique 3/5 times in order to demonstrate improved balance and decrease risk fo falls.    Time 4   Period Weeks   Status On-going               Plan - 0910/08/17605    Clinical Impression Statement Pt continued to report difficulty clearing L foot when ascending steps. Pt performed and added Bil DF using theraband to HEP.  Pt met goal for 5xSTS and improved 10MWT time today.  Pt will benefit from skilled PT services to decrease risk of falling and improve strength.     Rehab Potential Good   Clinical Impairments Affecting Rehab Potential neuropathy, chronic LBP, hx of CA, cardiac history, fatigue following CA treatment    PT Frequency 2x / week   PT Duration 4 weeks   PT Treatment/Interventions Aquatic Therapy;Gait training;Stair training;Therapeutic activities;Therapeutic exercise;Balance training;Neuromuscular re-education;Patient/family education;Cryotherapy;Vestibular   PT Next Visit Plan Airex balance, cone taps, head turns with ambulation, LE strengthening.  Review HEP with pt and update as appropriate.   PT Home Exercise Plan Review next session and update, pt to bring HEP handouts next session; added DF with YTB this session   Consulted and Agree with Plan of Care Patient      Patient will benefit from skilled therapeutic intervention in order to improve the following deficits and impairments:  Decreased strength, Abnormal gait, Impaired flexibility, Decreased activity tolerance, Decreased  balance, Decreased range of motion, Impaired sensation, Pain, Decreased endurance  Visit Diagnosis: Unsteadiness on feet  Muscle weakness (generalized)       G-Codes - 09October 08, 2017609    Functional Assessment Tool Used 10MWT, 5xSTS, MMT, Clinical Judgement   Functional Limitation Mobility: Walking and moving around   Mobility: Walking and Moving Around Current Status (G(L3734At least 20 percent but less than 40 percent impaired, limited or restricted   Mobility: Walking and Moving Around Goal Status (G([id number] least 1 percent but less than 20 percent impaired, limited or restricted      Problem List Patient Active Problem List  Diagnosis Date Noted  . Absence of bladder continence 07/21/2015  . Neuropathy ([place]) 06/02/2015  . Postoperative state 04/08/2015  . Carcinoma of endometrium ([place]) 04/02/2015  . Endometrial cancer ([place]) 03/24/2015  . Gastro-esophageal reflux disease without esophagitis 02/15/2015  . Osteopenia 08/17/2014  . Hyperlipidemia 02/13/2014  . Kidney stones 10/27/2013  . CA skin, basal cell 06/30/2013  . Narrowing of intervertebral disc space 06/30/2013  . Accumulation of fluid in tissues 06/30/2013  . Benign essential HTN 06/30/2013  . Calculus of kidney 06/30/2013  . Lumbar canal stenosis 06/30/2013  . Arthritis, degenerative 06/30/2013  . Neuralgia neuritis, sciatic nerve 06/30/2013  . Edema 06/30/2013  . Heart murmur 06/30/2013  . Hematuria 12/19/2012  . CAD (coronary artery disease)- elective CFX DES 10/24/12 for recurrent angina 10/25/2012  . Dyslipidemia 10/25/2012  . NSVT (nonsustained ventricular tachycardia) ([place]) 10/25/2012  . Bradycardia- tolerating low dose beta blocker 10/25/2012  . Cardiomyopathy, ischemic: EF 35-40% by echo post-MI 10/20/12 10/22/2012  . STEMI 10/19/12- LAD DES, and CFX DES 10/24/12 10/20/2012  . HTN (hypertension) 10/20/2012    Collie Siad PT, DPT 12/09/2015, 4:11 PM  [place]  MAIN [facility] SERVICES [address] [place], [place], [address] Phone: [phone]   Fax:  [phone]  Name: Susan [name] MRN: [id number] Date of Birth: Feb 14, 1935

## 2015-12-09 NOTE — Patient Instructions (Addendum)
ANKLE: Dorsiflexion (Band)    Sit in chair. Place band around top of foot. Keeping heel on floor, raise toes of banded foot while holding down the other side of the band with the other foot. 15 reps per set, 3 sets per day on each foot, 5 days per week.  Copyright  VHI. All rights reserved.

## 2015-12-13 ENCOUNTER — Ambulatory Visit: Payer: Medicare Other | Admitting: Physical Therapy

## 2015-12-13 ENCOUNTER — Encounter: Payer: Self-pay | Admitting: Physical Therapy

## 2015-12-13 VITALS — BP 118/47 | HR 52

## 2015-12-13 DIAGNOSIS — R2681 Unsteadiness on feet: Secondary | ICD-10-CM | POA: Diagnosis not present

## 2015-12-13 DIAGNOSIS — M6281 Muscle weakness (generalized): Secondary | ICD-10-CM

## 2015-12-13 NOTE — Therapy (Signed)
[place] MAIN [facility] SERVICES [address] [place], [place], [address] Phone: [phone]   Fax:  [phone]  Physical Therapy Treatment  Patient Details  Name: Susan [name] MRN: [id number] Date of Birth: 01/08/1935 Referring Provider: Jeb Levering  Encounter Date: 12/13/2015      PT End of Session - 12/13/15 1616    Visit Number 22   Number of Visits 43   Date for PT Re-Evaluation 12/20/15   Authorization Type 2/10   PT Start Time 9622   PT Stop Time 1615   PT Time Calculation (min) 45 min   Equipment Utilized During Treatment Gait belt   Activity Tolerance Patient tolerated treatment well   Behavior During Therapy [facility] for tasks assessed/performed      Past Medical History:  Diagnosis Date  . Bradycardia   . Coronary artery disease   . Heart murmur   . Hyperlipidemia   . Hypertension   . Myocardial infarction ([place])   . NSVT (nonsustained ventricular tachycardia) ([place])   . Uterine cancer ([place])    serrous    Past Surgical History:  Procedure Laterality Date  . ABDOMINAL HYSTERECTOMY    . LEFT HEART CATHETERIZATION WITH CORONARY ANGIOGRAM  10/19/2012   Procedure: LEFT HEART CATHETERIZATION WITH CORONARY ANGIOGRAM;  Surgeon: Troy Sine, MD;  Location: [facility] CATH LAB;  Service: Cardiovascular;;  . PERCUTANEOUS CORONARY STENT INTERVENTION (PCI-S)  10/19/2012   Procedure: PERCUTANEOUS CORONARY STENT INTERVENTION (PCI-S);  Surgeon: Troy Sine, MD;  Location: [facility] CATH LAB;  Service: Cardiovascular;;  . PERCUTANEOUS CORONARY STENT INTERVENTION (PCI-S) N/A 10/23/2012   Procedure: PERCUTANEOUS CORONARY STENT INTERVENTION (PCI-S);  Surgeon: Troy Sine, MD;  Location: [facility] CATH LAB;  Service: Cardiovascular;  Laterality: N/A;  . PERIPHERAL VASCULAR CATHETERIZATION N/A 05/05/2015   Procedure: Glori Luis Cath Insertion;  Surgeon: Algernon Huxley, MD;  Location: [place] CV LAB;  Service: Cardiovascular;  Laterality: N/A;    Vitals:    12/13/15 1538  BP: (!) 118/47  Pulse: (!) 52  SpO2: 99%        Subjective Assessment - 12/13/15 1535    Subjective Pt reports a week and a half span of more frequent diarrhea.  This began with her radiation treatment which began in April.  Saw her radiation doctor last Wednesday who suggested adding activia to her diet which pt reports has not helped.  Is scheduled to see her cancer MD in October whom she is hoping can shed some light on this pattern.  Pt denies having aches and pain, nausea, vomiting.  Suggested pt to set up an appointment with her PCP.  Says she is doing her HEP each day without complaints or concerns.  Shows therapist brusing over Bil hips which she believes is when she is working in the // bars and loses her balance.  Is on a blood thinner (Brilinta).     Pertinent History chronic LBP, uterine CA, hx of MI   Patient Stated Goals Improve balance, walk without cane    Currently in Pain? No/denies       TREATMENT  Therapeutic Exercise:  Sit<>stand 2x10 with RTB around knees with cues for eccentric control  Sideways walking with RTB around knees with cues for slight knee bend, 67min each direction  Ambulating in hallway with ball toss and head turn Bil  Ambulating in hallway with spontaneous commands for Backward/Forward/Left/Right walking and sudden steps (5 minutes)   Neuromuscular Rehab:  Tandem walking on airex x3 lengths  in // bars  Stepping over stones in // bars with cues for DF  Alternating toe taps up to 8" step from airex, 2x20                PT Education - 12/13/15 1539    Education provided Yes   Education Details Exercise technique.  Reviewed HEP.   Person(s) Educated Patient   Methods Demonstration;Verbal cues;Explanation   Comprehension Verbalized understanding;Returned demonstration             PT Long Term Goals - 12/09/15 1607      PT LONG TERM GOAL #1   Title pt will improve Berg balance score by 6pts to reduce fall risk     Time 8   Period Weeks   Status Achieved     PT LONG TERM GOAL #2   Title pt will improve 67mwalk speed to 1.2 m/s to improve her community mobility    Baseline 0.84 m/s with SPC, 0.78 with no AD (10/25/15), 1.07 m/s (12/09/15)   Time 8   Period Weeks   Status Partially Met     PT LONG TERM GOAL #3   Title pt will improve 5xsit to stand time to <14.257m demonstrating improved LE strength   Baseline 12.13 seconds on 12/09/15   Time 8   Period Weeks   Status Achieved     PT LONG TERM GOAL #4   Title pt will improve ankle strength to 4/5 to improve her static standing balance in the shower.    Time 8   Period Weeks   Status On-going     PT LONG TERM GOAL #5   Title pt will be able to demonstrate proper lifting technique 3/5 times in order to demonstrate improved balance and decrease risk fo falls.    Time 4   Period Weeks   Status On-going               Plan - 12/13/15 1555    Clinical Impression Statement Pt tolerated all interventions well with short rest breaks between exercises.  Reviewed HEP today and answered all questions.  Pt will benefit from skilled PT interventions including strengthening and balance exercises.     Rehab Potential Good   Clinical Impairments Affecting Rehab Potential neuropathy, chronic LBP, hx of CA, cardiac history, fatigue following CA treatment    PT Frequency 2x / week   PT Duration 4 weeks   PT Treatment/Interventions Aquatic Therapy;Gait training;Stair training;Therapeutic activities;Therapeutic exercise;Balance training;Neuromuscular re-education;Patient/family education;Cryotherapy;Vestibular   PT Next Visit Plan Airex balance, cone taps, head turns with ambulation, LE strengthening.    PT Home Exercise Plan Review next session and update, pt to bring HEP handouts next session; added DF with YTB this session   Consulted and Agree with Plan of Care Patient      Patient will benefit from skilled therapeutic intervention in order to  improve the following deficits and impairments:  Decreased strength, Abnormal gait, Impaired flexibility, Decreased activity tolerance, Decreased balance, Decreased range of motion, Impaired sensation, Pain, Decreased endurance  Visit Diagnosis: Unsteadiness on feet  Muscle weakness (generalized)     Problem List Patient Active Problem List   Diagnosis Date Noted  . Absence of bladder continence 07/21/2015  . Neuropathy ([place]03/10/2015  . Postoperative state 04/08/2015  . Carcinoma of endometrium ([place]/08/2015  . Endometrial cancer ([place]/28/2016  . Gastro-esophageal reflux disease without esophagitis 02/15/2015  . Osteopenia 08/17/2014  . Hyperlipidemia 02/13/2014  . Kidney stones 10/27/2013  .  CA skin, basal cell 06/30/2013  . Narrowing of intervertebral disc space 06/30/2013  . Accumulation of fluid in tissues 06/30/2013  . Benign essential HTN 06/30/2013  . Calculus of kidney 06/30/2013  . Lumbar canal stenosis 06/30/2013  . Arthritis, degenerative 06/30/2013  . Neuralgia neuritis, sciatic nerve 06/30/2013  . Edema 06/30/2013  . Heart murmur 06/30/2013  . Hematuria 12/19/2012  . CAD (coronary artery disease)- elective CFX DES 10/24/12 for recurrent angina 10/25/2012  . Dyslipidemia 10/25/2012  . NSVT (nonsustained ventricular tachycardia) ([place]) 10/25/2012  . Bradycardia- tolerating low dose beta blocker 10/25/2012  . Cardiomyopathy, ischemic: EF 35-40% by echo post-MI 10/20/12 10/22/2012  . STEMI 10/19/12- LAD DES, and CFX DES 10/24/12 10/20/2012  . HTN (hypertension) 10/20/2012    Collie Siad PT, DPT 12/13/2015, 4:18 PM  [place] MAIN [facility] SERVICES [address] [place], [place], [address] Phone: [phone]   Fax:  [phone]  Name: Susan [name] MRN: [id number] Date of Birth: [birth year]

## 2015-12-16 ENCOUNTER — Encounter: Payer: Self-pay | Admitting: Physical Therapy

## 2015-12-16 ENCOUNTER — Ambulatory Visit: Payer: Medicare Other | Admitting: Physical Therapy

## 2015-12-16 DIAGNOSIS — R2681 Unsteadiness on feet: Secondary | ICD-10-CM

## 2015-12-16 DIAGNOSIS — M6281 Muscle weakness (generalized): Secondary | ICD-10-CM

## 2015-12-16 NOTE — Therapy (Signed)
[place] MAIN [facility] SERVICES [address] [place], [place], [address] Phone: [phone]   Fax:  [phone]  Physical Therapy Treatment/Discharge  Patient Details  Name: Susan [name] MRN: [id number] Date of Birth: 1935-02-27 Referring Provider: Jeb Levering  Encounter Date: 12/16/2015      PT End of Session - 12/16/15 1631    Visit Number 23   Number of Visits 43   Date for PT Re-Evaluation 12/20/15   Authorization Type 3/10   PT Start Time 2641   PT Stop Time 1615   PT Time Calculation (min) 45 min   Equipment Utilized During Treatment Gait belt   Activity Tolerance Patient tolerated treatment well   Behavior During Therapy [facility] for tasks assessed/performed      Past Medical History:  Diagnosis Date  . Bradycardia   . Coronary artery disease   . Heart murmur   . Hyperlipidemia   . Hypertension   . Myocardial infarction ([place])   . NSVT (nonsustained ventricular tachycardia) ([place])   . Uterine cancer ([place])    serrous    Past Surgical History:  Procedure Laterality Date  . ABDOMINAL HYSTERECTOMY    . LEFT HEART CATHETERIZATION WITH CORONARY ANGIOGRAM  10/19/2012   Procedure: LEFT HEART CATHETERIZATION WITH CORONARY ANGIOGRAM;  Surgeon: Troy Sine, MD;  Location: [facility] CATH LAB;  Service: Cardiovascular;;  . PERCUTANEOUS CORONARY STENT INTERVENTION (PCI-S)  10/19/2012   Procedure: PERCUTANEOUS CORONARY STENT INTERVENTION (PCI-S);  Surgeon: Troy Sine, MD;  Location: [facility] CATH LAB;  Service: Cardiovascular;;  . PERCUTANEOUS CORONARY STENT INTERVENTION (PCI-S) N/A 10/23/2012   Procedure: PERCUTANEOUS CORONARY STENT INTERVENTION (PCI-S);  Surgeon: Troy Sine, MD;  Location: [facility] CATH LAB;  Service: Cardiovascular;  Laterality: N/A;  . PERIPHERAL VASCULAR CATHETERIZATION N/A 05/05/2015   Procedure: Glori Luis Cath Insertion;  Surgeon: Algernon Huxley, MD;  Location: [place] CV LAB;  Service: Cardiovascular;  Laterality: N/A;    There  were no vitals filed for this visit.      Subjective Assessment - 12/16/15 1628    Subjective Pt reports she was able to ascend to the top step at the [facility] football stadium this week without her cane and holding onto the railing.  She believes she has made progress with PT but expresses that she would like to see how she does on her own with her HEP in light of plateau.  She is walking in her home without a cane and denies any falls.  She has no concerns or complaints at this time.   Pertinent History chronic LBP, uterine CA, hx of MI   Patient Stated Goals Improve balance, walk without cane    Currently in Pain? No/denies            [facility] PT Assessment - 12/16/15 0001      Standardized Balance Assessment   Five times sit to stand comments  14.14   10 Meter Walk 0.86 m/s     Berg Balance Test   Sit to Stand Able to stand without using hands and stabilize independently   Standing Unsupported Able to stand safely 2 minutes   Sitting with Back Unsupported but Feet Supported on Floor or Stool Able to sit safely and securely 2 minutes   Stand to Sit Sits safely with minimal use of hands   Transfers Able to transfer safely, minor use of hands   Standing Unsupported with Eyes Closed Able to stand 10 seconds safely   Standing Ubsupported with  Feet Together Able to place feet together independently and stand 1 minute safely   From Standing, Reach Forward with Outstretched Arm Can reach forward >12 cm safely (5")   From Standing Position, Pick up Object from Yoe to pick up shoe safely and easily   From Standing Position, Turn to Look Behind Over each Shoulder Looks behind one side only/other side shows less weight shift   Turn 360 Degrees Able to turn 360 degrees safely one side only in 4 seconds or less   Standing Unsupported, Alternately Place Feet on Step/Stool Able to stand independently and safely and complete 8 steps in 20 seconds   Standing Unsupported, One Foot in Front Able to  take small step independently and hold 30 seconds   Standing on One Leg Able to lift leg independently and hold equal to or more than 3 seconds   Total Score 49       TREATMENT  Outcome measures completed and results explained to pt:  Berg: 49/56  10MWT: 0.84 m/s  5xSTS: 14.14 seconds   Therapeutic Exercise:  Sit<>stand 2x10 with cues for eccentric control, RTB around knees on second set  Sideways walking with RTB around knees with cues for slight knee bend, 40min each direction  Lifting 4lb dumbell weight from floor x5   Neuromuscular Rehab:  Tandem walking on airex x3 lengths in // bars  [place]homberg on airex with vertical and horizontal head turns  Tandem stance on airex with vertical and horizontal head turns  Alternating toe taps up to 8" step from airex, 2x20               PT Education - 12/16/15 1631    Education provided Yes   Education Details Pt denied any questions about HEP; Exercise technique   Person(s) Educated Patient   Methods Explanation;Verbal cues   Comprehension Returned demonstration;Verbalized understanding             PT Long Term Goals - 12/16/15 1636      PT LONG TERM GOAL #1   Title pt will improve Berg balance score by 6pts to reduce fall risk    Baseline 48/56; 12/16/15: 49/56   Time 8   Period Weeks   Status Partially Met     PT LONG TERM GOAL #2   Title pt will improve 13malk speed to 1.2 m/s to improve her community mobility    Baseline 0.84 m/s with SPC, 0.78 with no AD (10/25/15), 1.07 m/s (12/09/15); 0.84 m/s without AD (9/21917)   Time 8   Period Weeks   Status Partially Met     PT LONG TERM GOAL #3   Title pt will improve 5xsit to stand time to <14.71m70mdemonstrating improved LE strength   Baseline 12.13 seconds on 12/09/15; 14.14 seconds on 12/16/15   Time 8   Period Weeks   Status Achieved     PT LONG TERM GOAL #4   Title pt will improve ankle strength to 4/5 to improve her static standing balance in the shower.     Baseline 12/16/15: 3+/5   Time 8   Period Weeks   Status Partially Met     PT LONG TERM GOAL #5   Title pt will be able to demonstrate proper lifting technique 3/5 times in order to demonstrate improved balance and decrease risk fo falls.    Baseline 5/5 on 12/16/15   Time 4   Period Weeks   Status Achieved  Plan - 2015/12/31 1632    Clinical Impression Statement Pt denies any recent falls and has not been using cane in her home.  Was able to ascend to top step of football stadium this week without LOB and without cane, while holding onto railing.  As demonstrated by her 10MWT, 5xSTS, and Berg score, she is no longer demonstrating improvements in balance, LE strength, or gait speed.  She has made progress since her initial evaluation.  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  Pt was encouraged to keep up with the number of days she completes her HEP by checking off each day on her calendar she routinely checks each night and morning.  Pt is appropriate for and will be d/c today following progress and plateau with balance and strength gains.     Rehab Potential Good   Clinical Impairments Affecting Rehab Potential neuropathy, chronic LBP, hx of CA, cardiac history, fatigue following CA treatment    PT Frequency 2x / week   PT Duration 4 weeks   PT Treatment/Interventions Aquatic Therapy;Gait training;Stair training;Therapeutic activities;Therapeutic exercise;Balance training;Neuromuscular re-education;Patient/family education;Cryotherapy;Vestibular   Consulted and Agree with Plan of Care Patient      Patient will benefit from skilled therapeutic intervention in order to improve the following deficits and impairments:  Decreased strength, Abnormal gait, Impaired flexibility, Decreased activity tolerance, Decreased balance, Decreased range of motion, Impaired sensation, Pain, Decreased  endurance  Visit Diagnosis: Unsteadiness on feet  Muscle weakness (generalized)       G-Codes - 2015/12/31 1639    Functional Assessment Tool Used 5xSTS, 10MWT, Berg, Clinical Judgement   Functional Limitation Mobility: Walking and moving around   Mobility: Walking and Moving Around Goal Status [phone]) At least 1 percent but less than 20 percent impaired, limited or restricted   Mobility: Walking and Moving Around Discharge Status [phone]) At least 1 percent but less than 20 percent impaired, limited or restricted      Problem List Patient Active Problem List   Diagnosis Date Noted  . Absence of bladder continence 07/21/2015  . Neuropathy ([place]) 06/02/2015  . Postoperative state 04/08/2015  . Carcinoma of endometrium ([place]) 04/02/2015  . Endometrial cancer ([place]) 03/24/2015  . Gastro-esophageal reflux disease without esophagitis 02/15/2015  . Osteopenia 08/17/2014  . Hyperlipidemia 02/13/2014  . Kidney stones 10/27/2013  . CA skin, basal cell 06/30/2013  . Narrowing of intervertebral disc space 06/30/2013  . Accumulation of fluid in tissues 06/30/2013  . Benign essential HTN 06/30/2013  . Calculus of kidney 06/30/2013  . Lumbar canal stenosis 06/30/2013  . Arthritis, degenerative 06/30/2013  . Neuralgia neuritis, sciatic nerve 06/30/2013  . Edema 06/30/2013  . Heart murmur 06/30/2013  . Hematuria 12/19/2012  . CAD (coronary artery disease)- elective CFX DES 10/24/12 for recurrent angina 10/25/2012  . Dyslipidemia 10/25/2012  . NSVT (nonsustained ventricular tachycardia) ([place]) 10/25/2012  . Bradycardia- tolerating low dose beta blocker 10/25/2012  . Cardiomyopathy, ischemic: EF 35-40% by echo post-MI 10/20/12 10/22/2012  . STEMI 10/19/12- LAD DES, and CFX DES 10/24/12 10/20/2012  . HTN (hypertension) 10/20/2012     Collie Siad PT, DPT 2015/12/31, 4:46 PM  [place] MAIN [facility] SERVICES [address] [place], [place],  [address] Phone: [phone]   Fax:  [phone]  Name: Susan [name] MRN: [id number] Date of Birth: [birth year]

## 2015-12-22 ENCOUNTER — Other Ambulatory Visit: Payer: Self-pay | Admitting: Cardiovascular Disease

## 2015-12-22 NOTE — Telephone Encounter (Signed)
Rx(s) sent to pharmacy electronically.  

## 2015-12-23 ENCOUNTER — Ambulatory Visit: Payer: Medicare Other

## 2015-12-26 ENCOUNTER — Other Ambulatory Visit: Payer: Self-pay | Admitting: Cardiovascular Disease

## 2015-12-26 DIAGNOSIS — I2510 Atherosclerotic heart disease of native coronary artery without angina pectoris: Secondary | ICD-10-CM

## 2015-12-28 NOTE — Telephone Encounter (Signed)
Rx request sent to pharmacy.  

## 2016-01-21 ENCOUNTER — Inpatient Hospital Stay: Payer: Medicare Other | Attending: Hematology and Oncology | Admitting: Hematology and Oncology

## 2016-01-21 ENCOUNTER — Encounter: Payer: Self-pay | Admitting: Hematology and Oncology

## 2016-01-21 ENCOUNTER — Inpatient Hospital Stay: Payer: Medicare Other

## 2016-01-21 VITALS — BP 114/70 | HR 55 | Temp 95.4°F | Resp 18 | Wt 194.0 lb

## 2016-01-21 DIAGNOSIS — Z90710 Acquired absence of both cervix and uterus: Secondary | ICD-10-CM | POA: Diagnosis not present

## 2016-01-21 DIAGNOSIS — E785 Hyperlipidemia, unspecified: Secondary | ICD-10-CM | POA: Diagnosis not present

## 2016-01-21 DIAGNOSIS — R197 Diarrhea, unspecified: Secondary | ICD-10-CM | POA: Diagnosis not present

## 2016-01-21 DIAGNOSIS — I10 Essential (primary) hypertension: Secondary | ICD-10-CM | POA: Insufficient documentation

## 2016-01-21 DIAGNOSIS — G629 Polyneuropathy, unspecified: Secondary | ICD-10-CM

## 2016-01-21 DIAGNOSIS — M5136 Other intervertebral disc degeneration, lumbar region: Secondary | ICD-10-CM | POA: Insufficient documentation

## 2016-01-21 DIAGNOSIS — C541 Malignant neoplasm of endometrium: Secondary | ICD-10-CM | POA: Diagnosis not present

## 2016-01-21 DIAGNOSIS — I252 Old myocardial infarction: Secondary | ICD-10-CM | POA: Diagnosis not present

## 2016-01-21 DIAGNOSIS — Z95828 Presence of other vascular implants and grafts: Secondary | ICD-10-CM

## 2016-01-21 DIAGNOSIS — Z7982 Long term (current) use of aspirin: Secondary | ICD-10-CM | POA: Insufficient documentation

## 2016-01-21 DIAGNOSIS — Z9221 Personal history of antineoplastic chemotherapy: Secondary | ICD-10-CM | POA: Diagnosis not present

## 2016-01-21 DIAGNOSIS — M545 Low back pain: Secondary | ICD-10-CM | POA: Diagnosis not present

## 2016-01-21 DIAGNOSIS — Z923 Personal history of irradiation: Secondary | ICD-10-CM | POA: Diagnosis not present

## 2016-01-21 DIAGNOSIS — Z79899 Other long term (current) drug therapy: Secondary | ICD-10-CM

## 2016-01-21 DIAGNOSIS — Z87442 Personal history of urinary calculi: Secondary | ICD-10-CM | POA: Diagnosis not present

## 2016-01-21 DIAGNOSIS — M4316 Spondylolisthesis, lumbar region: Secondary | ICD-10-CM | POA: Diagnosis not present

## 2016-01-21 DIAGNOSIS — T451X5S Adverse effect of antineoplastic and immunosuppressive drugs, sequela: Secondary | ICD-10-CM | POA: Diagnosis not present

## 2016-01-21 DIAGNOSIS — G620 Drug-induced polyneuropathy: Secondary | ICD-10-CM | POA: Insufficient documentation

## 2016-01-21 DIAGNOSIS — I2510 Atherosclerotic heart disease of native coronary artery without angina pectoris: Secondary | ICD-10-CM | POA: Diagnosis not present

## 2016-01-21 LAB — CBC WITH DIFFERENTIAL/PLATELET
Basophils Absolute: 0 10*3/uL (ref 0–0.1)
Basophils Relative: 0 %
Eosinophils Absolute: 0.2 10*3/uL (ref 0–0.7)
Eosinophils Relative: 3 %
HCT: 33.8 % — ABNORMAL LOW (ref 35.0–47.0)
Hemoglobin: 11.6 g/dL — ABNORMAL LOW (ref 12.0–16.0)
Lymphocytes Relative: 16 %
Lymphs Abs: 0.9 10*3/uL — ABNORMAL LOW (ref 1.0–3.6)
MCH: 31 pg (ref 26.0–34.0)
MCHC: 34.3 g/dL (ref 32.0–36.0)
MCV: 90.6 fL (ref 80.0–100.0)
Monocytes Absolute: 0.5 10*3/uL (ref 0.2–0.9)
Monocytes Relative: 10 %
Neutro Abs: 3.9 10*3/uL (ref 1.4–6.5)
Neutrophils Relative %: 71 %
Platelets: 214 10*3/uL (ref 150–440)
RBC: 3.73 MIL/uL — ABNORMAL LOW (ref 3.80–5.20)
RDW: 13.4 % (ref 11.5–14.5)
WBC: 5.5 10*3/uL (ref 3.6–11.0)

## 2016-01-21 LAB — COMPREHENSIVE METABOLIC PANEL
ALT: 18 U/L (ref 14–54)
AST: 33 U/L (ref 15–41)
Albumin: 4 g/dL (ref 3.5–5.0)
Alkaline Phosphatase: 99 U/L (ref 38–126)
Anion gap: 9 (ref 5–15)
BUN: 21 mg/dL — ABNORMAL HIGH (ref 6–20)
CO2: 26 mmol/L (ref 22–32)
Calcium: 9.5 mg/dL (ref 8.9–10.3)
Chloride: 103 mmol/L (ref 101–111)
Creatinine, Ser: 0.82 mg/dL (ref 0.44–1.00)
GFR calc Af Amer: >60 mL/min (ref >60)
GFR calc non Af Amer: >60 mL/min (ref >60)
Glucose, Bld: 120 mg/dL — ABNORMAL HIGH (ref 65–99)
Potassium: 3.6 mmol/L (ref 3.5–5.1)
Sodium: 138 mmol/L (ref 135–145)
Total Bilirubin: 0.6 mg/dL (ref 0.3–1.2)
Total Protein: 6.6 g/dL (ref 6.5–8.1)

## 2016-01-21 MED ORDER — SODIUM CHLORIDE 0.9% FLUSH
10.0000 mL | INTRAVENOUS | Status: DC | PRN
  Administered 2016-01-21: 10 mL via INTRAVENOUS
  Filled 2016-01-21: qty 10

## 2016-01-21 MED ORDER — HEPARIN SOD (PORK) LOCK FLUSH 100 UNIT/ML IV SOLN
500.0000 [IU] | Freq: Once | INTRAVENOUS | Status: AC
  Administered 2016-01-21: 500 [IU] via INTRAVENOUS

## 2016-01-21 NOTE — Progress Notes (Signed)
Patient asking for new prescription for Gabapentin with higher quantity.  States she takes one tablet in the morning and two at bedtime.

## 2016-01-21 NOTE — Progress Notes (Signed)
[place] [facility] day:  01/21/16  Chief Complaint: Susan [name] is a 81 y.o. female with stage IIIC uterine cancer who is seen for 3 month assessment.  HPI:  The patient was last seen in the medical oncology clinic on 10/21/2015.  At that time, she was seen for initial assessment.  She had stage IIIC endometrial cancer s/p 2 cycles of carboplatin and Taxol followed by radiation.  During the interim, she has had some bowel issues. She has 4-5 stools a day. She states that the number of bowel movements depends on what she eats. She states that red sauces is a problem for her bowels.  Alfredo sauce is good. She continues to do water aerobics on Tuesdays and Thursdays. She has some issues with low back pain which preceded her diagnosis. She is using a TENS unit.    Past Medical History:  Diagnosis Date  . Bradycardia   . Coronary artery disease   . Heart murmur   . Hyperlipidemia   . Hypertension   . Myocardial infarction   . NSVT (nonsustained ventricular tachycardia) ([place])   . Uterine cancer ([place])    serrous    Past Surgical History:  Procedure Laterality Date  . ABDOMINAL HYSTERECTOMY    . LEFT HEART CATHETERIZATION WITH CORONARY ANGIOGRAM  10/19/2012   Procedure: LEFT HEART CATHETERIZATION WITH CORONARY ANGIOGRAM;  Surgeon: Troy Sine, MD;  Location: [facility] CATH LAB;  Service: Cardiovascular;;  . PERCUTANEOUS CORONARY STENT INTERVENTION (PCI-S)  10/19/2012   Procedure: PERCUTANEOUS CORONARY STENT INTERVENTION (PCI-S);  Surgeon: Troy Sine, MD;  Location: [facility] CATH LAB;  Service: Cardiovascular;;  . PERCUTANEOUS CORONARY STENT INTERVENTION (PCI-S) N/A 10/23/2012   Procedure: PERCUTANEOUS CORONARY STENT INTERVENTION (PCI-S);  Surgeon: Troy Sine, MD;  Location: [facility] CATH LAB;  Service: Cardiovascular;  Laterality: N/A;  . PERIPHERAL VASCULAR CATHETERIZATION N/A 05/05/2015   Procedure: Glori Luis Cath Insertion;  Surgeon: Algernon Huxley, MD;  Location:  [place] CV LAB;  Service: Cardiovascular;  Laterality: N/A;    Family History  Problem Relation Age of Onset  . Heart disease Mother   . Breast cancer Mother   . Heart disease Father   . Diabetes Father   . Diabetes Brother   . Stroke Brother     Social History:  reports that she has never smoked. She has never used smokeless tobacco. She reports that she drinks about 0.6 oz of alcohol per week . She reports that she does not use drugs.  She is originally from [place].  She lives in [place].  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  The patient is alone today.  Allergies:  Allergies  Allergen Reactions  . Ace Inhibitors Cough  . Penicillins Rash    Current Medications: Current Outpatient Prescriptions  Medication Sig Dispense Refill  . acetaminophen (TYLENOL) 500 MG tablet Take 500 mg by mouth every 6 (six) hours as needed.    [name] aspirin EC 81 MG tablet Take 81 mg by mouth daily.    [name] atorvastatin (LIPITOR) 80 MG tablet TAKE 1 TABLET BY MOUTH AT 6PM 30 tablet 6  . ezetimibe (ZETIA) 10 MG tablet TAKE 1 TABLET (10 MG TOTAL) BY MOUTH DAILY. 30 tablet 11  . furosemide (LASIX) 20 MG tablet TAKE 1 TABLET BY MOUTH DAILY. 90 tablet 2  . gabapentin (NEURONTIN) 100 MG capsule Take 100 mg by mouth 2 (two) times daily.    [name]  isosorbide mononitrate (IMDUR) 30 MG 24 hr tablet TAKE 1 TABLET BY MOUTH EVERY DAY 30 tablet 10  . lidocaine-prilocaine (EMLA) cream Apply to affected area once 30 g 3  . loperamide (IMODIUM A-D) 2 MG tablet Take 2 mg by mouth 4 (four) times daily as needed for diarrhea or loose stools.    [name] losartan (COZAAR) 50 MG tablet Take 1 tablet (50 mg total) by mouth daily. KEEP OV. 90 tablet 0  . Multiple Vitamin (MULTIVITAMIN WITH MINERALS) TABS Take 1 tablet by mouth daily.    . nitroGLYCERIN (NITROSTAT) 0.4 MG SL tablet Place 1 tablet (0.4 mg total) under the tongue every 5 (five) minutes x 3 doses as needed for chest pain. 25 tablet 3  .  omeprazole (PRILOSEC) 20 MG capsule Take 20 mg by mouth daily.    [name] pyridOXINE (VITAMIN B-6) 100 MG tablet Take 100 mg by mouth 2 (two) times daily.    . ticagrelor (BRILINTA) 60 MG TABS tablet Take 1 tablet (60 mg total) by mouth 2 (two) times daily. Please keep your upcoming appointment (11/28/15) for refills. 60 tablet 5  . Vitamin D, Cholecalciferol, 1000 UNITS CAPS Take 1 capsule by mouth daily.     No current facility-administered medications for this visit.    Facility-Administered Medications Ordered in Other Visits  Medication Dose Route Frequency Provider Last Rate Last Dose  . sodium chloride flush (NS) 0.9 % injection 10 mL  10 mL Intravenous PRN Lequita Asal, MD   10 mL at 01/21/16 1508    Review of Systems:  GENERAL:  Feels "ok".  No fevers or sweats.  Weight gain of 2 pounds. PERFORMANCE STATUS (ECOG):  2 HEENT:  No visual changes, runny nose, sore throat, mouth sores or tenderness. Lungs: No shortness of breath or cough.  No hemoptysis.  Sleep apnea.   Cardiac:  No chest pain, palpitations, orthopnea, or PND.  MI in 2014. GI:  Frequent stools depending on what she eats.  No nausea, vomiting, constipation, melena or hematochezia.  GU:  No urgency, frequency, dysuria, or hematuria. Musculoskeletal:  Back issues using a TENS unit.  No joint pain.  No muscle tenderness. Extremities:  No pain or swelling. Skin:  No rashes or skin changes. Neuro:  Neuropathy (feet and fingers have improved).  No headache, focal weakness, balance or coordination issues. Endocrine:  No diabetes, thyroid issues, hot flashes or night sweats. Psych:  No mood changes, depression or anxiety. Pain:  No focal pain. Review of systems:  All other systems reviewed and found to be negative.  Physical Exam: Blood pressure 114/70, pulse (!) 55, temperature (!) 95.4 F (35.2 C), temperature source Tympanic, resp. rate 18, weight 194 lb 0.1 oz (88 kg). GENERAL:  Well developed, well nourished, woman  sitting comfortably in the exam room in no acute distress.  She has a cane at her side. MENTAL STATUS:  Alert and oriented to person, place and time. HEAD:  Short gray hair.  Normocephalic, atraumatic, face symmetric, no Cushingoid features. EYES:  Glasses.  Blue eyes.  Pupils equal round and reactive to light and accomodation.  No conjunctivitis or scleral icterus. ENT:  Oropharynx clear without lesion.  Tongue normal. Mucous membranes moist.  RESPIRATORY:  Clear to auscultation without rales, wheezes or rhonchi. CARDIOVASCULAR:  Regular rate and rhythm without murmur, rub or gallop. ABDOMEN:  Soft, non-tender, with active bowel sounds, and no hepatosplenomegaly.  No masses. SKIN:  No rashes, ulcers or lesions. EXTREMITIES: No edema, no skin  discoloration or tenderness.  No palpable cords. LYMPH NODES: No palpable cervical, supraclavicular, axillary or inguinal adenopathy  NEUROLOGICAL: Appropriate. PSYCH:  Appropriate.   Appointment on 01/21/2016  Component Date Value Ref Range Status  . WBC 01/21/2016 5.5  3.6 - 11.0 K/uL Final  . RBC 01/21/2016 3.73* 3.80 - 5.20 MIL/uL Final  . Hemoglobin 01/21/2016 11.6* 12.0 - 16.0 g/dL Final  . HCT 01/21/2016 33.8* 35.0 - 47.0 % Final  . MCV 01/21/2016 90.6  80.0 - 100.0 fL Final  . MCH 01/21/2016 31.0  26.0 - 34.0 pg Final  . MCHC 01/21/2016 34.3  32.0 - 36.0 g/dL Final  . RDW 01/21/2016 13.4  11.5 - 14.5 % Final  . Platelets 01/21/2016 214  150 - 440 K/uL Final  . Neutrophils Relative % 01/21/2016 71  % Final  . Neutro Abs 01/21/2016 3.9  1.4 - 6.5 K/uL Final  . Lymphocytes Relative 01/21/2016 16  % Final  . Lymphs Abs 01/21/2016 0.9* 1.0 - 3.6 K/uL Final  . Monocytes Relative 01/21/2016 10  % Final  . Monocytes Absolute 01/21/2016 0.5  0.2 - 0.9 K/uL Final  . Eosinophils Relative 01/21/2016 3  % Final  . Eosinophils Absolute 01/21/2016 0.2  0 - 0.7 K/uL Final  . Basophils Relative 01/21/2016 0  % Final  . Basophils Absolute 01/21/2016 0.0   0 - 0.1 K/uL Final  . Sodium 01/21/2016 138  135 - 145 mmol/L Final  . Potassium 01/21/2016 3.6  3.5 - 5.1 mmol/L Final  . Chloride 01/21/2016 103  101 - 111 mmol/L Final  . CO2 01/21/2016 26  22 - 32 mmol/L Final  . Glucose, Bld 01/21/2016 120* 65 - 99 mg/dL Final  . BUN 01/21/2016 21* 6 - 20 mg/dL Final  . Creatinine, Ser 01/21/2016 0.82  0.44 - 1.00 mg/dL Final  . Calcium 01/21/2016 9.5  8.9 - 10.3 mg/dL Final  . Total Protein 01/21/2016 6.6  6.5 - 8.1 g/dL Final  . Albumin 01/21/2016 4.0  3.5 - 5.0 g/dL Final  . AST 01/21/2016 33  15 - 41 U/L Final  . ALT 01/21/2016 18  14 - 54 U/L Final  . Alkaline Phosphatase 01/21/2016 99  38 - 126 U/L Final  . Total Bilirubin 01/21/2016 0.6  0.3 - 1.2 mg/dL Final  . GFR calc non Af Amer 01/21/2016 >60  >60 mL/min Final  . GFR calc Af Amer 01/21/2016 >60  >60 mL/min Final   Comment: (NOTE) The eGFR has been calculated using the CKD EPI equation. This calculation has not been validated in all clinical situations. eGFR's persistently <60 mL/min signify possible Chronic Kidney Disease.   . Anion gap 01/21/2016 9  5 - 15 Final    Assessment:  SELIA WAREING is a 81 y.o. female with stage IIIC endometrial cancer s/p TLH/BSO and staging on 04/08/2015 at [facility].  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  Tumor invaded 2 mm into a 14 mm thick myometrium.  There was no cervical involvement.   There was lymphovascular invasion.  Washings were negative.  There were 3 positive lymph nodes in the pelvis (left external iliac node: 1 cm focus without extranodal extension; less than 0.1 mm focus in 1 external right iliac node and a 0.5 mm focus in another right external iliac node).  Pathologic stage was [id number].  Abdomen and pelvic CT scan on 03/30/2015 revealed fluid and/or soft tissue filling the endometrial cavity, potentially prolapsing through  the cervical os into the upper vagina,  compatible with the reported endometrial neoplasm.  There was no definite evidence of metastatic disease in the abdomen or pelvis.  There was multiple nonobstructive calculi in the left renal collecting system.  There was grade 1 spondylolisthesis of L4 upon L5 with extensive multilevel degenerative disc disease and facet arthropathy throughout the lumbar spine, including mild levoscoliosis.  She received 2 cycles of carboplatin and Taxol (05/12/2015 - 06/02/2015).  Chemotherapy was truncated secondary to a debilitating neuropathy.  She received 4500 cGy pelvic radiation and 1200 cGy using high dose rate remote afterloading through vaginal cylinder (completed 10/18/2015).  CA125 has been followed:  82.5 on 04/28/2015, 22.0 on 06/02/2015, 18.2 on 07/14/2015, 11.7 on 10/21/2015, and 16.4 on 01/21/2016.  She was started on Neurontin for neuropathy.  She states that the higher doses made her "fuzzy headed".  Symptomatically, she has frequent stools depending on what she eats.  She has had some back pain and uses a TENS unit.  Exam is stable.  Plan: 1.  Labs today:  CBC with diff, CMP, CA125. 2.  Discuss back pain.  Discuss images obtained in 03/2015 with multi-level degenerative disk disease.  No apparent change in symptoms.  Consider reimaging back is any concern. 3.  Continue Neurontin (low dose secondary to side effects). 4.  Discuss trial of Enterogam for diarrhea.  Will request samples. 5.  Port flush every 6-8 weeks. 6.  RTC in 3 months for MD assess and labs (CBC with diff, CMP, CA125).   Lequita Asal, MD  01/21/2016, 4:07 PM

## 2016-01-22 LAB — CA 125: CA 125: 16.4 U/mL (ref 0.0–38.1)

## 2016-01-24 ENCOUNTER — Other Ambulatory Visit: Payer: Self-pay | Admitting: *Deleted

## 2016-01-24 DIAGNOSIS — C541 Malignant neoplasm of endometrium: Secondary | ICD-10-CM

## 2016-01-25 ENCOUNTER — Other Ambulatory Visit: Payer: Self-pay | Admitting: Cardiovascular Disease

## 2016-01-27 ENCOUNTER — Telehealth: Payer: Self-pay | Admitting: *Deleted

## 2016-01-27 NOTE — Telephone Encounter (Signed)
Pt came in to get samples of enteragam.  She was explained that she is to take 1 packet a day mixed in juice, water or yogurt mix with 4 oz of fluid. Mix well without shaking the drink. One mixed then drink immediately.  Pt verbalized understanding of directions and she should call and let us know if it works and [facility] can write rx for it for the future. She also wants refill of gabapentin and she wants if 1 in am and 2 in pm.  Will send rx

## 2016-01-28 ENCOUNTER — Other Ambulatory Visit: Payer: Self-pay | Admitting: *Deleted

## 2016-01-28 DIAGNOSIS — C541 Malignant neoplasm of endometrium: Secondary | ICD-10-CM

## 2016-01-28 DIAGNOSIS — G63 Polyneuropathy in diseases classified elsewhere: Secondary | ICD-10-CM

## 2016-01-28 DIAGNOSIS — C801 Malignant (primary) neoplasm, unspecified: Secondary | ICD-10-CM

## 2016-01-28 MED ORDER — GABAPENTIN 100 MG PO CAPS: ORAL_CAPSULE | ORAL | 1 refills | Status: DC

## 2016-02-03 ENCOUNTER — Other Ambulatory Visit: Payer: Self-pay | Admitting: Cardiovascular Disease

## 2016-02-08 ENCOUNTER — Other Ambulatory Visit: Payer: Self-pay | Admitting: Cardiovascular Disease

## 2016-02-22 ENCOUNTER — Other Ambulatory Visit: Payer: Self-pay

## 2016-02-22 DIAGNOSIS — I2510 Atherosclerotic heart disease of native coronary artery without angina pectoris: Secondary | ICD-10-CM

## 2016-02-22 MED ORDER — ISOSORBIDE MONONITRATE ER 30 MG PO TB24: 30.0000 mg | ORAL_TABLET | Freq: Every day | ORAL | 2 refills | Status: DC

## 2016-02-22 MED ORDER — EZETIMIBE 10 MG PO TABS: 10.0000 mg | ORAL_TABLET | Freq: Every day | ORAL | 2 refills | Status: DC

## 2016-02-23 ENCOUNTER — Encounter: Payer: Self-pay | Admitting: Obstetrics and Gynecology

## 2016-02-23 ENCOUNTER — Inpatient Hospital Stay: Payer: Medicare Other | Attending: Hematology and Oncology | Admitting: Obstetrics and Gynecology

## 2016-02-23 VITALS — BP 124/71 | HR 62 | Temp 97.2°F | Ht 64.0 in | Wt 201.9 lb

## 2016-02-23 DIAGNOSIS — R197 Diarrhea, unspecified: Secondary | ICD-10-CM | POA: Insufficient documentation

## 2016-02-23 DIAGNOSIS — Z79899 Other long term (current) drug therapy: Secondary | ICD-10-CM | POA: Diagnosis not present

## 2016-02-23 DIAGNOSIS — Z6834 Body mass index (BMI) 34.0-34.9, adult: Secondary | ICD-10-CM | POA: Insufficient documentation

## 2016-02-23 DIAGNOSIS — G629 Polyneuropathy, unspecified: Secondary | ICD-10-CM | POA: Insufficient documentation

## 2016-02-23 DIAGNOSIS — I255 Ischemic cardiomyopathy: Secondary | ICD-10-CM | POA: Insufficient documentation

## 2016-02-23 DIAGNOSIS — I472 Ventricular tachycardia: Secondary | ICD-10-CM | POA: Insufficient documentation

## 2016-02-23 DIAGNOSIS — I252 Old myocardial infarction: Secondary | ICD-10-CM | POA: Diagnosis not present

## 2016-02-23 DIAGNOSIS — R32 Unspecified urinary incontinence: Secondary | ICD-10-CM | POA: Insufficient documentation

## 2016-02-23 DIAGNOSIS — M1990 Unspecified osteoarthritis, unspecified site: Secondary | ICD-10-CM

## 2016-02-23 DIAGNOSIS — Z90710 Acquired absence of both cervix and uterus: Secondary | ICD-10-CM | POA: Diagnosis not present

## 2016-02-23 DIAGNOSIS — K219 Gastro-esophageal reflux disease without esophagitis: Secondary | ICD-10-CM | POA: Diagnosis not present

## 2016-02-23 DIAGNOSIS — Z8542 Personal history of malignant neoplasm of other parts of uterus: Secondary | ICD-10-CM | POA: Insufficient documentation

## 2016-02-23 DIAGNOSIS — I10 Essential (primary) hypertension: Secondary | ICD-10-CM | POA: Diagnosis not present

## 2016-02-23 DIAGNOSIS — Z7982 Long term (current) use of aspirin: Secondary | ICD-10-CM | POA: Diagnosis not present

## 2016-02-23 DIAGNOSIS — Z90722 Acquired absence of ovaries, bilateral: Secondary | ICD-10-CM | POA: Insufficient documentation

## 2016-02-23 DIAGNOSIS — I2510 Atherosclerotic heart disease of native coronary artery without angina pectoris: Secondary | ICD-10-CM | POA: Insufficient documentation

## 2016-02-23 DIAGNOSIS — M48061 Spinal stenosis, lumbar region without neurogenic claudication: Secondary | ICD-10-CM | POA: Diagnosis not present

## 2016-02-23 DIAGNOSIS — R109 Unspecified abdominal pain: Secondary | ICD-10-CM

## 2016-02-23 DIAGNOSIS — E785 Hyperlipidemia, unspecified: Secondary | ICD-10-CM | POA: Insufficient documentation

## 2016-02-23 DIAGNOSIS — E669 Obesity, unspecified: Secondary | ICD-10-CM

## 2016-02-23 DIAGNOSIS — G518 Other disorders of facial nerve: Secondary | ICD-10-CM

## 2016-02-23 DIAGNOSIS — C541 Malignant neoplasm of endometrium: Secondary | ICD-10-CM

## 2016-02-23 NOTE — Progress Notes (Signed)
Gynecologic Oncology Visit   Referring Provider: Dr Joylene Igo  Chief Concern: stage IIIC1serous endometrial cancer  Subjective:  Susan [name] is a 81 y.o. P54 female who is seen in consultation from Dr. Newman Nip for serous uterine cancer.   She presents today for continued surveillance. She saw Dr. Mike Gip on 01/21/2016 and had a complete exam that was negative. She presents today for a pelvic exam.   She has multiple complaints including fatigue, abdominal pain, diarrhea, bladder issues, back pain, leg swelling, peripheral neuropathy, and vaginal discharge. However, on further discussion these symptoms are all chronic and mild except the abdominal pain which is located in the right groin and is mild in nature. She has been using Enterogram for diarrhea but she can not tell if it is helping or not. The bladder issues are incontinence and those have actually improved - she suspects the "vaginal discharge" may actually be urine. The neuropathy is unchanged.    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    Lab Results  Component Value Date   CA125 16.4 01/21/2016   CA125 11.7 10/21/2015   CA125 18.2 07/14/2015     Gynecologic Oncology  Susan [name] is a 81 y.o. 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  Please see complete details in prior notes.   Path report below shows stage IIIC1 disease.  Washings were negative, but she had 3 positive SLNs in pelvis.  An aortic SLN was negative.     A. Lymph node, sentinel, right external iliac, excision: Minute focus of metastatic carcinoma involving one lymph node (1/1). See note. Size of metastasis: Less than 0.1 mm. Negative for extranodal invasion.  Note: A cytokeratin AE1/3 highlights the metastatic focus.  B. Lymph node, sentinel, right external iliac #2, excision: Metastatic carcinoma involving one  lymph node (1/1). See note. Size of metastasis: 0.5 mm. Negative for extranodal invasion. Note: A cytokeratin AE1/3 highlights the metastatic focus.  C. Lymph node, sentinel, right aortic, excision: One benign lymph node (0/1). Note: A cytokeratin AE1/3 immunostain is negative.  D. Lymph node, sentinel, left external iliac, excision:  Metastatic adenocarcinoma involving one lymph node (1/1).   Size of metastasis: 1 cm. Negative for extranodal invasion. Note: A cytokeratin AE1/3 highlights the metastatic focus.  E. 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  Tumor size: 5.1 cm. The tumor invades 2 mm into a 14 mm thick myometrium. Negative for cervical involvement. Lymphovascular invasion is present.  04/28/2015 CA125 82.5   Recommendation to proceed with with carboplatinum and paclitaxel three-four cycles followed by radiation therapy followed by additional 2-3 cycles of chemotherapy. She started on chemotherapy on 05/12/2015 and received 2nd cycle on 06/02/2015. She developed significant neuropathy impairing her quality of life with complaints of sensory ataxia, frequent falls, and needs to walk with the help of walker. Chemotherapy was discontinued. Then received 4500 cGy pelvic radiation and 1200 cGy using high dose rate remote afterloading through vaginal cylinder completed 7/17.  Problem List: Patient Active Problem List   Diagnosis Date Noted  . Absence of bladder continence 07/21/2015  . Neuropathy ([place]) 06/02/2015  . Postoperative state 04/08/2015  . Carcinoma of endometrium ([place]) 04/02/2015  . Endometrial cancer ([place]) 03/24/2015  . Gastro-esophageal reflux disease without esophagitis 02/15/2015  . Osteopenia 08/17/2014  . Hyperlipidemia 02/13/2014  . Kidney stones 10/27/2013  .  CA skin, basal cell 06/30/2013  . Narrowing of  intervertebral disc space 06/30/2013  . Accumulation of fluid in tissues 06/30/2013  . Benign essential HTN 06/30/2013  . Calculus of kidney 06/30/2013  . Lumbar canal stenosis 06/30/2013  . Arthritis, degenerative 06/30/2013  . Neuralgia neuritis, sciatic nerve 06/30/2013  . Edema 06/30/2013  . Heart murmur 06/30/2013  . Hematuria 12/19/2012  . CAD (coronary artery disease)- elective CFX DES 10/24/12 for recurrent angina 10/25/2012  . Dyslipidemia 10/25/2012  . NSVT (nonsustained ventricular tachycardia) ([place]) 10/25/2012  . Bradycardia- tolerating low dose beta blocker 10/25/2012  . Cardiomyopathy, ischemic: EF 35-40% by echo post-MI 10/20/12 10/22/2012  . STEMI 10/19/12- LAD DES, and CFX DES 10/24/12 10/20/2012  . HTN (hypertension) 10/20/2012    Past Medical History: Past Medical History:  Diagnosis Date  . Bradycardia   . Coronary artery disease   . Heart murmur   . Hyperlipidemia   . Hypertension   . Myocardial infarction   . NSVT (nonsustained ventricular tachycardia) ([place])   . Uterine cancer ([place])    serrous    Past Surgical History: Past Surgical History:  Procedure Laterality Date  . ABDOMINAL HYSTERECTOMY    . LEFT HEART CATHETERIZATION WITH CORONARY ANGIOGRAM  10/19/2012   Procedure: LEFT HEART CATHETERIZATION WITH CORONARY ANGIOGRAM;  Surgeon: Troy Sine, MD;  Location: [facility] CATH LAB;  Service: Cardiovascular;;  . PERCUTANEOUS CORONARY STENT INTERVENTION (PCI-S)  10/19/2012   Procedure: PERCUTANEOUS CORONARY STENT INTERVENTION (PCI-S);  Surgeon: Troy Sine, MD;  Location: [facility] CATH LAB;  Service: Cardiovascular;;  . PERCUTANEOUS CORONARY STENT INTERVENTION (PCI-S) N/A 10/23/2012   Procedure: PERCUTANEOUS CORONARY STENT INTERVENTION (PCI-S);  Surgeon: Troy Sine, MD;  Location: [facility] CATH LAB;  Service: Cardiovascular;  Laterality: N/A;  . PERIPHERAL VASCULAR CATHETERIZATION N/A 05/05/2015   Procedure: Glori Luis Cath Insertion;  Surgeon: Algernon Huxley, MD;  Location: [facility] CV LAB;  Service: Cardiovascular;  Laterality: N/A;    Family History: Family History  Problem Relation Age of Onset  . Heart disease Mother   . Breast cancer Mother   . Heart disease Father   . Diabetes Father   . Diabetes Brother   . Stroke Brother     Social History: Social History   Social History  . Marital status: Married    Spouse name: N/A  . Number of children: N/A  . Years of education: N/A   Occupational History  . Not on file.   Social History Main Topics  . Smoking status: Never Smoker  . Smokeless tobacco: Never Used  . Alcohol use 0.6 oz/week    1 Glasses of wine per week  . Drug use: No  . Sexual activity: Yes   Other Topics Concern  . Not on file   Social History Narrative  . No narrative on file    Allergies: Allergies  Allergen Reactions  . Ace Inhibitors Cough  . Penicillins Rash    Current Medications: Current Outpatient Prescriptions  Medication Sig Dispense Refill  . aspirin EC 81 MG tablet Take 81 mg by mouth daily.    [name] atorvastatin (LIPITOR) 80 MG tablet TAKE 1 TABLET BY MOUTH AT 6PM 30 tablet 6  . BRILINTA 60 MG TABS tablet TAKE 1 TABLET BY MOUTH TWICE A DAY 60 tablet 4  . ezetimibe (ZETIA) 10 MG tablet Take 1 tablet (10 mg total) by mouth daily. 90 tablet 2  . furosemide (LASIX) 20 MG tablet TAKE 1 TABLET BY MOUTH DAILY. 90 tablet  2  . gabapentin (NEURONTIN) 100 MG capsule 1 capsule in am and 2 capsules in pm 270 capsule 1  . isosorbide mononitrate (IMDUR) 30 MG 24 hr tablet Take 1 tablet (30 mg total) by mouth daily. 90 tablet 2  . lidocaine-prilocaine (EMLA) cream Apply to affected area once 30 g 3  . loperamide (IMODIUM A-D) 2 MG tablet Take 2 mg by mouth 4 (four) times daily as needed for diarrhea or loose stools.    [name] losartan (COZAAR) 50 MG tablet TAKE 1 TABLET EVERY DAY 90 tablet 0  . Multiple Vitamin (MULTIVITAMIN WITH MINERALS) TABS Take 1 tablet by mouth daily.    [name] NITROSTAT 0.4 MG SL tablet PLACE 1 TABLET  UNDER THE TOUGUE EVERY 5 MINUTES FOR UP TO 3 DOSES AS NEEDED FOR CHEST PAIN 25 tablet 0  . omeprazole (PRILOSEC) 20 MG capsule Take 20 mg by mouth daily.    [name] pyridOXINE (VITAMIN B-6) 100 MG tablet Take 100 mg by mouth 2 (two) times daily.    . Vitamin D, Cholecalciferol, 1000 UNITS CAPS Take 1 capsule by mouth daily.     No current facility-administered medications for this visit.     Review of Systems General: fatigue Pulmonary: negative for, dyspnea,  productive cough Gastrointestinal: abdominal pain and chronic diarrhea, but negative for distension/bloating, decreased appetite, n/v,constipation Genitourinary/Sexual: as per interval history Ob/Gyn: vaginal discharge Musculoskeletal: back pain and leg swelling unchanged Hematology: No coagulation disorder, negative for, easy bruising, bleeding Neurologic/Psych: peripheral neuropathy  Objective:  Physical Examination:  BP 124/71 (BP Location: Left Arm, Patient Position: Sitting)   Pulse 62   Temp 97.2 F (36.2 C) (Tympanic)   Ht 5\' 4"  (1.626 m)   Wt 201 lb 15.1 oz (91.6 kg)   BMI 34.66 kg/m    ECOG Performance Status: 0 - Asymptomatic  General appearance: alert, cooperative and appears stated age Lymph node survey: non-palpable, inguinal nodes Abdomen: well healed incisions, nontender, protuberant but soft. No ascites or masses or hernias Extremities: extremities normal, atraumatic, no cyanosis. Edema symmetrical Neurological exam reveals alert, oriented, normal speech, she is walking with a cane  Pelvic: exam chaperoned by nurse;  Vulva: normal appearing vulva with no masses, tenderness or lesions; Vagina: normal vagina; no lesions; BME: no masses; Uterus/Cervix:surgically absent; Rectal: deferred    Assessment:  Susan [name] is a 81 y.o. female with stage IIIC1 mixed grade 3 serous/endometrioid uterine cancer with three positive pelvic sentinel nodes and negative right aortic SLN, s/p 2 cycles of adjuvant  carboplatin/paclitaxel chemotherapy (discontinued due to neuropathy interfering with ambulation), external pelvic radiation and vaginal brachytherapy. NED today on exam and recent CA125 normal. Multiple complaints that are chronic in nature or mild. Diarrhea.  Body mass index is 34.66 kg/m.   Ischemic heart disease with cornonary stents on Brilinta, but good functional status.  Problem List Items Addressed This Visit      Genitourinary   Endometrial cancer ([place]) - Primary    Other Visit Diagnoses    Diarrhea, unspecified type          She will continue follow up with Drs.Corcoran and Chrystal at the [facility] cancer center.  We will try to alternate visits with Dr. Mike Gip every 3 months.   I have recommended continued close follow up with exams, including pelvic exams every 3-6 months for 2-3 years, then every 6-12 months for 3-5 years and then annually thereafter.  Imaging and laboratory assessment is based on clinical indication. Patient education for obesity, lifestyle,  exercise, nutrition, sexual health, vaginal lubricants. We discussed her weight and need for weight loss, stressed good nutrition, and exercise.  I provided information regarding calorie counting and exercise for weight loss. I offered referral to the CARE program and provided a pamphlet today.   Info [employer] provided today.    Gillis Ends, MD

## 2016-02-23 NOTE — Progress Notes (Signed)
  Oncology Nurse Navigator Documentation Chaperoned pelvic exam.  Navigator Location: [facility] (02/23/16 1200)   )Navigator Encounter Type: Follow-up Appt (02/23/16 1200)                                                    Time Spent with Patient: 30 (02/23/16 1200)

## 2016-02-23 NOTE — Patient Instructions (Signed)
SURVEILLANCE I have recommended continued close follow up with exams, including pelvic exams every 3-6 months for 2-3 years, then every 6-12 months for 3-5 years and then annually thereafter.  Imaging and laboratory assessment is based on clinical indication. Patient education for obesity, lifestyle, exercise, nutrition, sexual health, vaginal lubricants. We discussed her weight and need for weight loss, stressed good nutrition, and exercise.  I provided information regarding calorie counting and exercise for weight loss. I offered referral to the CARE program and provided a pamphlet today.         Food Choices to Help Relieve Diarrhea, Adult When you have diarrhea, the foods you eat and your eating habits are very important. Choosing the right foods and drinks can help relieve diarrhea. Also, because diarrhea can last up to 7 days, you need to replace lost fluids and electrolytes (such as sodium, potassium, and chloride) in order to help prevent dehydration. What general guidelines do I need to follow?  Slowly drink 1 cup (8 oz) of fluid for each episode of diarrhea. If you are getting enough fluid, your urine will be clear or pale yellow.  Eat starchy foods. Some good choices include white rice, white toast, pasta, low-fiber cereal, baked potatoes (without the skin), saltine crackers, and bagels.  Avoid large servings of any cooked vegetables.  Limit fruit to two servings per day. A serving is  cup or 1 small piece.  Choose foods with less than 2 g of fiber per serving.  Limit fats to less than 8 tsp (38 g) per day.  Avoid fried foods.  Eat foods that have probiotics in them. Probiotics can be found in certain dairy products.  Avoid foods and beverages that may increase the speed at which food moves through the stomach and intestines (gastrointestinal tract). Things to avoid include:  High-fiber foods, such as dried fruit, raw fruits and vegetables, nuts, seeds, and whole grain  foods.  Spicy foods and high-fat foods.  Foods and beverages sweetened with high-fructose corn syrup, honey, or sugar alcohols such as xylitol, sorbitol, and mannitol. What foods are recommended? Grains  White rice. White, [place], or pita breads (fresh or toasted), including plain rolls, buns, or bagels. White pasta. Saltine, soda, or graham crackers. Pretzels. Low-fiber cereal. Cooked cereals made with water (such as cornmeal, farina, or cream cereals). Plain muffins. Matzo. Melba toast. Zwieback. Vegetables  Potatoes (without the skin). Strained tomato and vegetable juices. Most well-cooked and canned vegetables without seeds. Tender lettuce. Fruits  Cooked or canned applesauce, apricots, cherries, fruit cocktail, grapefruit, peaches, pears, or plums. Fresh bananas, apples without skin, cherries, grapes, cantaloupe, grapefruit, peaches, oranges, or plums. Meat and Other Protein Products  Baked or boiled chicken. Eggs. Tofu. Fish. Seafood. Smooth peanut butter. Ground or well-cooked tender beef, ham, veal, lamb, pork, or poultry. Dairy  Plain yogurt, kefir, and unsweetened liquid yogurt. Lactose-free milk, buttermilk, or soy milk. Plain hard cheese. Beverages  Sport drinks. Clear broths. Diluted fruit juices (except prune). Regular, caffeine-free sodas such as ginger ale. Water. Decaffeinated teas. Oral rehydration solutions. Sugar-free beverages not sweetened with sugar alcohols. Other  Bouillon, broth, or soups made from recommended foods. The items listed above may not be a complete list of recommended foods or beverages. Contact your dietitian for more options.  What foods are not recommended? Grains  Whole grain, whole wheat, bran, or rye breads, rolls, pastas, crackers, and cereals. Wild or brown rice. Cereals that contain more than 2 g of fiber per serving. Corn tortillas or  taco shells. Cooked or dry oatmeal. Granola. Popcorn. Vegetables  Raw vegetables. Cabbage, broccoli, Brussels  sprouts, artichokes, baked beans, beet greens, corn, kale, legumes, peas, sweet potatoes, and yams. Potato skins. Cooked spinach and cabbage. Fruits  Dried fruit, including raisins and dates. Raw fruits. Stewed or dried prunes. Fresh apples with skin, apricots, mangoes, pears, raspberries, and strawberries. Meat and Other Protein Products  Chunky peanut butter. Nuts and seeds. Beans and lentils. Berniece Salines. Dairy  High-fat cheeses. Milk, chocolate milk, and beverages made with milk, such as milk shakes. Cream. Ice cream. Sweets and Desserts  Sweet rolls, doughnuts, and sweet breads. Pancakes and waffles. Fats and Oils  Butter. Cream sauces. Margarine. Salad oils. Plain salad dressings. Olives. Avocados. Beverages  Caffeinated beverages (such as coffee, tea, soda, or energy drinks). Alcoholic beverages. Fruit juices with pulp. Prune juice. Soft drinks sweetened with high-fructose corn syrup or sugar alcohols. Other  Coconut. Hot sauce. Chili powder. Mayonnaise. Gravy. Cream-based or milk-based soups. The items listed above may not be a complete list of foods and beverages to avoid. Contact your dietitian for more information.  What should I do if I become dehydrated? Diarrhea can sometimes lead to dehydration. Signs of dehydration include dark urine and dry mouth and skin. If you think you are dehydrated, you should rehydrate with an oral rehydration solution. These solutions can be purchased at pharmacies, retail stores, or online. Drink -1 cup (120-240 mL) of oral rehydration solution each time you have an episode of diarrhea. If drinking this amount makes your diarrhea worse, try drinking smaller amounts more often. For example, drink 1-3 tsp (5-15 mL) every 5-10 minutes. A general rule for staying hydrated is to drink 1-2 L of fluid per day. Talk to your health care provider about the specific amount you should be drinking each day. Drink enough fluids to keep your urine clear or pale  yellow. This information is not intended to replace advice given to you by your health care provider. Make sure you discuss any questions you have with your health care provider. Document Released: 06/03/2003 Document Revised: 08/19/2015 Document Reviewed: 02/03/2013 Elsevier Interactive Patient Education  2017 [employer].

## 2016-02-23 NOTE — Progress Notes (Signed)
Patient here for follow up. No changes since last appt. 

## 2016-02-25 ENCOUNTER — Telehealth: Payer: Self-pay | Admitting: *Deleted

## 2016-02-25 NOTE — Telephone Encounter (Signed)
Patient called about questioning port removal.  Patient informed that Dr. Mike Gip is ok with port being removed as long as she is aware that if chemo is needed in the future that a port would have to be put back in, pt informed of this, voiced understanding.  Patient wants to revisit this with her next appt in January.

## 2016-03-03 ENCOUNTER — Inpatient Hospital Stay: Payer: Medicare Other | Attending: Hematology and Oncology

## 2016-03-03 DIAGNOSIS — Z9221 Personal history of antineoplastic chemotherapy: Secondary | ICD-10-CM | POA: Diagnosis not present

## 2016-03-03 DIAGNOSIS — C801 Malignant (primary) neoplasm, unspecified: Secondary | ICD-10-CM

## 2016-03-03 DIAGNOSIS — Z923 Personal history of irradiation: Secondary | ICD-10-CM | POA: Diagnosis not present

## 2016-03-03 DIAGNOSIS — C541 Malignant neoplasm of endometrium: Secondary | ICD-10-CM | POA: Insufficient documentation

## 2016-03-03 DIAGNOSIS — Z452 Encounter for adjustment and management of vascular access device: Secondary | ICD-10-CM | POA: Diagnosis not present

## 2016-03-03 MED ORDER — SODIUM CHLORIDE 0.9% FLUSH
10.0000 mL | INTRAVENOUS | Status: AC | PRN
  Administered 2016-03-03: 10 mL
  Filled 2016-03-03: qty 10

## 2016-03-03 MED ORDER — HEPARIN SOD (PORK) LOCK FLUSH 100 UNIT/ML IV SOLN
500.0000 [IU] | INTRAVENOUS | Status: AC | PRN
  Administered 2016-03-03: 500 [IU]

## 2016-03-06 ENCOUNTER — Other Ambulatory Visit: Payer: Self-pay | Admitting: *Deleted

## 2016-03-06 MED ORDER — ATORVASTATIN CALCIUM 40 MG PO TABS: 80.0000 mg | ORAL_TABLET | Freq: Every day | ORAL | 1 refills | Status: DC

## 2016-03-06 MED ORDER — ATORVASTATIN CALCIUM 80 MG PO TABS: 80.0000 mg | ORAL_TABLET | Freq: Every day | ORAL | 1 refills | Status: DC

## 2016-03-16 ENCOUNTER — Other Ambulatory Visit: Payer: Self-pay | Admitting: Family Medicine

## 2016-03-16 DIAGNOSIS — Z1231 Encounter for screening mammogram for malignant neoplasm of breast: Secondary | ICD-10-CM

## 2016-04-19 ENCOUNTER — Ambulatory Visit: Payer: Medicare Other

## 2016-04-21 ENCOUNTER — Ambulatory Visit: Payer: Medicare Other | Admitting: Hematology and Oncology

## 2016-04-21 ENCOUNTER — Other Ambulatory Visit: Payer: Medicare Other

## 2016-04-25 ENCOUNTER — Inpatient Hospital Stay (HOSPITAL_BASED_OUTPATIENT_CLINIC_OR_DEPARTMENT_OTHER): Payer: Medicare Other | Admitting: Hematology and Oncology

## 2016-04-25 ENCOUNTER — Encounter: Payer: Self-pay | Admitting: Hematology and Oncology

## 2016-04-25 ENCOUNTER — Inpatient Hospital Stay: Payer: Medicare Other | Attending: Hematology and Oncology

## 2016-04-25 VITALS — BP 144/74 | HR 56 | Temp 95.5°F | Resp 18 | Wt 194.2 lb

## 2016-04-25 DIAGNOSIS — Z9221 Personal history of antineoplastic chemotherapy: Secondary | ICD-10-CM | POA: Insufficient documentation

## 2016-04-25 DIAGNOSIS — Z90710 Acquired absence of both cervix and uterus: Secondary | ICD-10-CM | POA: Insufficient documentation

## 2016-04-25 DIAGNOSIS — I2510 Atherosclerotic heart disease of native coronary artery without angina pectoris: Secondary | ICD-10-CM | POA: Diagnosis not present

## 2016-04-25 DIAGNOSIS — G620 Drug-induced polyneuropathy: Secondary | ICD-10-CM | POA: Diagnosis not present

## 2016-04-25 DIAGNOSIS — Z803 Family history of malignant neoplasm of breast: Secondary | ICD-10-CM

## 2016-04-25 DIAGNOSIS — Z79899 Other long term (current) drug therapy: Secondary | ICD-10-CM | POA: Insufficient documentation

## 2016-04-25 DIAGNOSIS — Z7982 Long term (current) use of aspirin: Secondary | ICD-10-CM | POA: Diagnosis not present

## 2016-04-25 DIAGNOSIS — Z95828 Presence of other vascular implants and grafts: Secondary | ICD-10-CM

## 2016-04-25 DIAGNOSIS — M4316 Spondylolisthesis, lumbar region: Secondary | ICD-10-CM | POA: Diagnosis not present

## 2016-04-25 DIAGNOSIS — I10 Essential (primary) hypertension: Secondary | ICD-10-CM

## 2016-04-25 DIAGNOSIS — Z923 Personal history of irradiation: Secondary | ICD-10-CM | POA: Insufficient documentation

## 2016-04-25 DIAGNOSIS — Z880 Allergy status to penicillin: Secondary | ICD-10-CM

## 2016-04-25 DIAGNOSIS — I252 Old myocardial infarction: Secondary | ICD-10-CM | POA: Insufficient documentation

## 2016-04-25 DIAGNOSIS — E785 Hyperlipidemia, unspecified: Secondary | ICD-10-CM | POA: Diagnosis not present

## 2016-04-25 DIAGNOSIS — M5136 Other intervertebral disc degeneration, lumbar region: Secondary | ICD-10-CM

## 2016-04-25 DIAGNOSIS — N200 Calculus of kidney: Secondary | ICD-10-CM | POA: Insufficient documentation

## 2016-04-25 DIAGNOSIS — E669 Obesity, unspecified: Secondary | ICD-10-CM

## 2016-04-25 DIAGNOSIS — M549 Dorsalgia, unspecified: Secondary | ICD-10-CM

## 2016-04-25 DIAGNOSIS — T451X5A Adverse effect of antineoplastic and immunosuppressive drugs, initial encounter: Secondary | ICD-10-CM

## 2016-04-25 DIAGNOSIS — C541 Malignant neoplasm of endometrium: Secondary | ICD-10-CM

## 2016-04-25 DIAGNOSIS — T451X5S Adverse effect of antineoplastic and immunosuppressive drugs, sequela: Secondary | ICD-10-CM

## 2016-04-25 LAB — COMPREHENSIVE METABOLIC PANEL
ALT: 15 U/L (ref 14–54)
AST: 29 U/L (ref 15–41)
Albumin: 4 g/dL (ref 3.5–5.0)
Alkaline Phosphatase: 132 U/L — ABNORMAL HIGH (ref 38–126)
Anion gap: 7 (ref 5–15)
BUN: 22 mg/dL — ABNORMAL HIGH (ref 6–20)
CO2: 24 mmol/L (ref 22–32)
Calcium: 9.5 mg/dL (ref 8.9–10.3)
Chloride: 107 mmol/L (ref 101–111)
Creatinine, Ser: 0.87 mg/dL (ref 0.44–1.00)
GFR calc Af Amer: >60 mL/min (ref >60)
GFR calc non Af Amer: >60 mL/min (ref >60)
Glucose, Bld: 120 mg/dL — ABNORMAL HIGH (ref 65–99)
Potassium: 3.7 mmol/L (ref 3.5–5.1)
Sodium: 138 mmol/L (ref 135–145)
Total Bilirubin: 0.7 mg/dL (ref 0.3–1.2)
Total Protein: 6.6 g/dL (ref 6.5–8.1)

## 2016-04-25 LAB — CBC WITH DIFFERENTIAL/PLATELET
Basophils Absolute: 0 10*3/uL (ref 0–0.1)
Basophils Relative: 0 %
Eosinophils Absolute: 0.1 10*3/uL (ref 0–0.7)
Eosinophils Relative: 2 %
HCT: 33.7 % — ABNORMAL LOW (ref 35.0–47.0)
Hemoglobin: 11.6 g/dL — ABNORMAL LOW (ref 12.0–16.0)
Lymphocytes Relative: 15 %
Lymphs Abs: 0.9 10*3/uL — ABNORMAL LOW (ref 1.0–3.6)
MCH: 30.8 pg (ref 26.0–34.0)
MCHC: 34.5 g/dL (ref 32.0–36.0)
MCV: 89.4 fL (ref 80.0–100.0)
Monocytes Absolute: 0.5 10*3/uL (ref 0.2–0.9)
Monocytes Relative: 9 %
Neutro Abs: 4.5 10*3/uL (ref 1.4–6.5)
Neutrophils Relative %: 74 %
Platelets: 225 10*3/uL (ref 150–440)
RBC: 3.77 MIL/uL — ABNORMAL LOW (ref 3.80–5.20)
RDW: 13.7 % (ref 11.5–14.5)
WBC: 6 10*3/uL (ref 3.6–11.0)

## 2016-04-25 MED ORDER — HEPARIN SOD (PORK) LOCK FLUSH 100 UNIT/ML IV SOLN
500.0000 [IU] | Freq: Once | INTRAVENOUS | Status: AC
  Administered 2016-04-25: 500 [IU] via INTRAVENOUS

## 2016-04-25 MED ORDER — SODIUM CHLORIDE 0.9% FLUSH
10.0000 mL | INTRAVENOUS | Status: DC | PRN
Start: 2016-04-25 — End: 2016-04-25
  Administered 2016-04-25: 10 mL via INTRAVENOUS
  Filled 2016-04-25: qty 10

## 2016-04-25 MED ORDER — GABAPENTIN 100 MG PO CAPS: ORAL_CAPSULE | ORAL | 1 refills | Status: DC

## 2016-04-25 NOTE — Progress Notes (Signed)
[facility] day:  04/25/16  Chief Complaint: Susan [name] is a 82 y.o. female with stage IIIC uterine cancer who is seen for 3 month assessment.  HPI:  The patient was last seen in the medical oncology clinic on 01/21/2016.  At that time, she noted frequent stools depending on what she ate.  She had some back pain and used a TENS unit.  Images obtained in 03/2015 noted multi-level degenerative disk disease.  There were no apparent change in symptoms.  We discussed consideration of reimaging of her back if there was any concern.  Exam was stable.  Labs at last visit included a hematocrit of 33.8, hemoglobin 11.6, and MCV 90.6.  CMP and CA125 (16.4) were normal.  She saw Dr. Theora Gianotti on 02/23/2016.  Pelvic exam was unremarkable.  Recommendation included exams, including pelvic exams every 3-6 months for 2-3 years, then every 6-12 months for 3-5 years and then annually.  Imaging and laboratory assessment to be based on clinical indication. She was provided education for obesity, lifestyle, exercise, nutrition, sexual health, vaginal lubricants.  Weight loss, good nutrition, and exercise were encourgaed.  She was offered referral to the CARE program and provided a pamphlet.  She has a follow-up appointment on 08/13/2016.  During the interim, she continues to teach water aerobics.  Overall, she feels pretty good.  She has neuropathy in her feet.  She has had some back problems.  She was going to physical therapy x 4 months.  She has been using ice and stretching.   Past Medical History:  Diagnosis Date  . Bradycardia   . Coronary artery disease   . Heart murmur   . Hyperlipidemia   . Hypertension   . Myocardial infarction   . NSVT (nonsustained ventricular tachycardia) ([place])   . Uterine cancer ([place])    serrous    Past Surgical History:  Procedure Laterality Date  . ABDOMINAL HYSTERECTOMY    . LEFT HEART CATHETERIZATION WITH CORONARY ANGIOGRAM   10/19/2012   Procedure: LEFT HEART CATHETERIZATION WITH CORONARY ANGIOGRAM;  Surgeon: Troy Sine, MD;  Location: [facility] CATH LAB;  Service: Cardiovascular;;  . PERCUTANEOUS CORONARY STENT INTERVENTION (PCI-S)  10/19/2012   Procedure: PERCUTANEOUS CORONARY STENT INTERVENTION (PCI-S);  Surgeon: Troy Sine, MD;  Location: [facility] CATH LAB;  Service: Cardiovascular;;  . PERCUTANEOUS CORONARY STENT INTERVENTION (PCI-S) N/A 10/23/2012   Procedure: PERCUTANEOUS CORONARY STENT INTERVENTION (PCI-S);  Surgeon: Troy Sine, MD;  Location: [facility] CATH LAB;  Service: Cardiovascular;  Laterality: N/A;  . PERIPHERAL VASCULAR CATHETERIZATION N/A 05/05/2015   Procedure: Glori Luis Cath Insertion;  Surgeon: Algernon Huxley, MD;  Location: [facility] CV LAB;  Service: Cardiovascular;  Laterality: N/A;    Family History  Problem Relation Age of Onset  . Heart disease Mother   . Breast cancer Mother   . Heart disease Father   . Diabetes Father   . Diabetes Brother   . Stroke Brother     Social History:  reports that she has never smoked. She has never used smokeless tobacco. She reports that she drinks about 0.6 oz of alcohol per week . She reports that she does not use drugs.  She is originally from [place].  She lives in [place].  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  [name] today.  Allergies:  Allergies  Allergen Reactions  . Ace Inhibitors Cough  . Penicillins  Rash    Current Medications: Current Outpatient Prescriptions  Medication Sig Dispense Refill  . aspirin EC 81 MG tablet Take 81 mg by mouth daily.    [name] atorvastatin (LIPITOR) 40 MG tablet Take 2 tablets (80 mg total) by mouth daily. 180 tablet 1  . atorvastatin (LIPITOR) 80 MG tablet Take 1 tablet (80 mg total) by mouth daily at 6 PM. 90 tablet 1  . BRILINTA 60 MG TABS tablet TAKE 1 TABLET BY MOUTH TWICE A DAY 60 tablet 4  . ezetimibe (ZETIA) 10 MG tablet Take 1 tablet (10 mg total)  by mouth daily. 90 tablet 2  . furosemide (LASIX) 20 MG tablet TAKE 1 TABLET BY MOUTH DAILY. 90 tablet 2  . gabapentin (NEURONTIN) 100 MG capsule 1 capsule in am and 2 capsules in pm 270 capsule 1  . isosorbide mononitrate (IMDUR) 30 MG 24 hr tablet Take 1 tablet (30 mg total) by mouth daily. 90 tablet 2  . lidocaine-prilocaine (EMLA) cream Apply to affected area once 30 g 3  . loperamide (IMODIUM A-D) 2 MG tablet Take 2 mg by mouth 4 (four) times daily as needed for diarrhea or loose stools.    [name] losartan (COZAAR) 50 MG tablet TAKE 1 TABLET EVERY DAY 90 tablet 0  . Multiple Vitamin (MULTIVITAMIN WITH MINERALS) TABS Take 1 tablet by mouth daily.    [name] NITROSTAT 0.4 MG SL tablet PLACE 1 TABLET UNDER THE TOUGUE EVERY 5 MINUTES FOR UP TO 3 DOSES AS NEEDED FOR CHEST PAIN 25 tablet 0  . omeprazole (PRILOSEC) 20 MG capsule Take 20 mg by mouth daily.    [name] pyridOXINE (VITAMIN B-6) 100 MG tablet Take 100 mg by mouth 2 (two) times daily.    . Vitamin D, Cholecalciferol, 1000 UNITS CAPS Take 1 capsule by mouth daily.     No current facility-administered medications for this visit.    Facility-Administered Medications Ordered in Other Visits  Medication Dose Route Frequency Provider Last Rate Last Dose  . sodium chloride flush (NS) 0.9 % injection 10 mL  10 mL Intravenous PRN Lloyd Huger, MD   10 mL at 04/25/16 1431    Review of Systems:  GENERAL:  Feels "pretty good".  No fevers or sweats.  Weight stable. PERFORMANCE STATUS (ECOG):  2 HEENT:  No visual changes, runny nose, sore throat, mouth sores or tenderness. Lungs: No shortness of breath or cough.  No hemoptysis.  Sleep apnea.   Cardiac:  No chest pain, palpitations, orthopnea, or PND.  MI in 2014. GI:  Frequent stools depending on what she eats.  Enterogam did not help. Using Probiotics.  No nausea, vomiting, constipation, melena or hematochezia.  GU:  No urgency, frequency, dysuria, or hematuria. Musculoskeletal:  Back issues (see HPI).   No joint pain.  No muscle tenderness. Extremities:  No pain or swelling. Skin:  No rashes or skin changes. Neuro:  Neuropathy (feet and fingers).  No headache, focal weakness, balance or coordination issues. Endocrine:  No diabetes, thyroid issues, hot flashes or night sweats. Psych:  No mood changes, depression or anxiety. Pain:  Back pain. Review of systems:  All other systems reviewed and found to be negative.  Physical Exam: Blood pressure (!) 144/74, pulse (!) 56, temperature (!) 95.5 F (35.3 C), temperature source Tympanic, resp. rate 18, weight 194 lb 3.6 oz (88.1 kg). GENERAL:  Well developed, well nourished, woman sitting comfortably in the exam room in no acute distress.  MENTAL STATUS:  Alert and oriented  to person, place and time. HEAD:  Short gray hair.  Normocephalic, atraumatic, face symmetric, no Cushingoid features. EYES:  Glasses.  Blue eyes.  Pupils equal round and reactive to light and accomodation.  No conjunctivitis or scleral icterus. ENT:  Oropharynx clear without lesion.  Tongue normal. Mucous membranes moist.  RESPIRATORY:  Clear to auscultation without rales, wheezes or rhonchi. CARDIOVASCULAR:  Regular rate and rhythm without murmur, rub or gallop. ABDOMEN:  Soft, non-tender, with active bowel sounds, and no hepatosplenomegaly.  No masses. SKIN:  No rashes, ulcers or lesions. EXTREMITIES: No edema, no skin discoloration or tenderness.  No palpable cords. LYMPH NODES: No palpable cervical, supraclavicular, axillary or inguinal adenopathy  NEUROLOGICAL: Appropriate. PSYCH:  Appropriate.   Infusion on 04/25/2016  Component Date Value Ref Range Status  . WBC 04/25/2016 6.0  3.6 - 11.0 K/uL Final  . RBC 04/25/2016 3.77* 3.80 - 5.20 MIL/uL Final  . Hemoglobin 04/25/2016 11.6* 12.0 - 16.0 g/dL Final  . HCT 04/25/2016 33.7* 35.0 - 47.0 % Final  . MCV 04/25/2016 89.4  80.0 - 100.0 fL Final  . MCH 04/25/2016 30.8  26.0 - 34.0 pg Final  . MCHC 04/25/2016 34.5   32.0 - 36.0 g/dL Final  . RDW 04/25/2016 13.7  11.5 - 14.5 % Final  . Platelets 04/25/2016 225  150 - 440 K/uL Final  . Neutrophils Relative % 04/25/2016 74  % Final  . Neutro Abs 04/25/2016 4.5  1.4 - 6.5 K/uL Final  . Lymphocytes Relative 04/25/2016 15  % Final  . Lymphs Abs 04/25/2016 0.9* 1.0 - 3.6 K/uL Final  . Monocytes Relative 04/25/2016 9  % Final  . Monocytes Absolute 04/25/2016 0.5  0.2 - 0.9 K/uL Final  . Eosinophils Relative 04/25/2016 2  % Final  . Eosinophils Absolute 04/25/2016 0.1  0 - 0.7 K/uL Final  . Basophils Relative 04/25/2016 0  % Final  . Basophils Absolute 04/25/2016 0.0  0 - 0.1 K/uL Final  . Sodium 04/25/2016 138  135 - 145 mmol/L Final  . Potassium 04/25/2016 3.7  3.5 - 5.1 mmol/L Final  . Chloride 04/25/2016 107  101 - 111 mmol/L Final  . CO2 04/25/2016 24  22 - 32 mmol/L Final  . Glucose, Bld 04/25/2016 120* 65 - 99 mg/dL Final  . BUN 04/25/2016 22* 6 - 20 mg/dL Final  . Creatinine, Ser 04/25/2016 0.87  0.44 - 1.00 mg/dL Final  . Calcium 04/25/2016 9.5  8.9 - 10.3 mg/dL Final  . Total Protein 04/25/2016 6.6  6.5 - 8.1 g/dL Final  . Albumin 04/25/2016 4.0  3.5 - 5.0 g/dL Final  . AST 04/25/2016 29  15 - 41 U/L Final  . ALT 04/25/2016 15  14 - 54 U/L Final  . Alkaline Phosphatase 04/25/2016 132* 38 - 126 U/L Final  . Total Bilirubin 04/25/2016 0.7  0.3 - 1.2 mg/dL Final  . GFR calc non Af Amer 04/25/2016 >60  >60 mL/min Final  . GFR calc Af Amer 04/25/2016 >60  >60 mL/min Final   Comment: (NOTE) The eGFR has been calculated using the CKD EPI equation. This calculation has not been validated in all clinical situations. eGFR's persistently <60 mL/min signify possible Chronic Kidney Disease.   . Anion gap 04/25/2016 7  5 - 15 Final    Assessment:  Susan [name] is a 82 y.o. female with stage IIIC endometrial cancer s/p TLH/BSO and staging on 04/08/2015 at [facility].  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  Tumor invaded 2 mm into a 14 mm thick myometrium.  There was no cervical involvement.   There was lymphovascular invasion.  Washings were negative.  There were 3 positive lymph nodes in the pelvis (left external iliac node: 1 cm focus without extranodal extension; less than 0.1 mm focus in 1 external right iliac node and a 0.5 mm focus in another right external iliac node).  Pathologic stage was [id number].  Abdomen and pelvic CT scan on 03/30/2015 revealed fluid and/or soft tissue filling the endometrial cavity, potentially prolapsing through the cervical os into the upper vagina, compatible with the reported endometrial neoplasm.  There was no definite evidence of metastatic disease in the abdomen or pelvis.  There was multiple nonobstructive calculi in the left renal collecting system.  There was grade 1 spondylolisthesis of L4 upon L5 with extensive multilevel degenerative disc disease and facet arthropathy throughout the lumbar spine, including mild levoscoliosis.  She received 2 cycles of carboplatin and Taxol (05/12/2015 - 06/02/2015).  Chemotherapy was truncated secondary to a debilitating neuropathy.  She received 4500 cGy pelvic radiation and 1200 cGy using high dose rate remote afterloading through vaginal cylinder (completed 10/18/2015).  CA125 has been followed:  82.5 on 04/28/2015, 22.0 on 06/02/2015, 18.2 on 07/14/2015, 11.7 on 10/21/2015, and 16.4 on 01/21/2016.  She was started on Neurontin for neuropathy.  Higher doses made her "fuzzy headed".  Symptomatically, she has had some issues with her back since Christmas.  Exam is stable.  Plan: 1.  Labs today:  CBC with diff, CMP, CA125. 2.  Discuss ongoing follow-up with exams (including pelvic exams) every 3-6 months for 2-3 years, then every 6-12 months for 3-5 years and then annually.  Imaging and laboratory assessment to be based on clinical indication.  3.  Rx:  Neurontin 100 mg BID (dis: # 60). 4.  Port  flush every 6-8 weeks. 5.  With next port flush- add LFTs 6.  RTC in 4 months for MD assessment and labs (CBC with diff, CMP, CA125).   Lequita Asal, MD  04/25/2016, 3:21 PM

## 2016-04-25 NOTE — Progress Notes (Signed)
Patient wants to discuss Gabapentin with MD.  Continues to have neuropathy in bilateral feet.

## 2016-04-26 LAB — CA 125: CA 125: 14.2 U/mL (ref 0.0–38.1)

## 2016-04-30 ENCOUNTER — Other Ambulatory Visit: Payer: Self-pay | Admitting: Cardiovascular Disease

## 2016-05-05 ENCOUNTER — Other Ambulatory Visit: Payer: Self-pay | Admitting: Cardiovascular Disease

## 2016-05-09 ENCOUNTER — Ambulatory Visit
Admission: RE | Admit: 2016-05-09 | Discharge: 2016-05-09 | Disposition: A | Discharge status: Home / Self Care | Payer: Medicare Other | Source: Ambulatory Visit | Attending: Family Medicine | Admitting: Family Medicine

## 2016-05-09 DIAGNOSIS — Z1231 Encounter for screening mammogram for malignant neoplasm of breast: Secondary | ICD-10-CM | POA: Diagnosis not present

## 2016-05-09 HISTORY — DX: Personal history of antineoplastic chemotherapy: Z92.21

## 2016-05-09 HISTORY — DX: Personal history of irradiation: Z92.3

## 2016-05-10 ENCOUNTER — Other Ambulatory Visit: Payer: Self-pay | Admitting: *Deleted

## 2016-05-10 ENCOUNTER — Encounter: Payer: Self-pay | Admitting: Radiation Oncology

## 2016-05-10 ENCOUNTER — Ambulatory Visit
Admission: RE | Admit: 2016-05-10 | Discharge: 2016-05-10 | Disposition: A | Discharge status: Home / Self Care | Payer: Medicare Other | Source: Ambulatory Visit | Attending: Radiation Oncology | Admitting: Radiation Oncology

## 2016-05-10 VITALS — BP 143/76 | HR 51 | Temp 96.4°F | Resp 20 | Wt 203.3 lb

## 2016-05-10 DIAGNOSIS — M545 Low back pain: Secondary | ICD-10-CM | POA: Insufficient documentation

## 2016-05-10 DIAGNOSIS — Z923 Personal history of irradiation: Secondary | ICD-10-CM | POA: Diagnosis not present

## 2016-05-10 DIAGNOSIS — C541 Malignant neoplasm of endometrium: Secondary | ICD-10-CM

## 2016-05-10 DIAGNOSIS — Z90710 Acquired absence of both cervix and uterus: Secondary | ICD-10-CM | POA: Insufficient documentation

## 2016-05-10 DIAGNOSIS — Z90722 Acquired absence of ovaries, bilateral: Secondary | ICD-10-CM | POA: Diagnosis not present

## 2016-05-10 DIAGNOSIS — Z9221 Personal history of antineoplastic chemotherapy: Secondary | ICD-10-CM | POA: Insufficient documentation

## 2016-05-10 DIAGNOSIS — N393 Stress incontinence (female) (male): Secondary | ICD-10-CM | POA: Insufficient documentation

## 2016-05-10 DIAGNOSIS — Z8542 Personal history of malignant neoplasm of other parts of uterus: Secondary | ICD-10-CM | POA: Insufficient documentation

## 2016-05-10 NOTE — Progress Notes (Signed)
Radiation Oncology Follow up Note  Name: Susan [name]   Date:   05/10/2016 MRN:  [id number] DOB: Jul 02, 1934    This 82 y.o. female presents to the clinic today for six-month follow-up status post surgery as well as postoperative radiation therapy for stage IIIc mixed endometrial carcinoma.  REFERRING PROVIDER: [name], MD  HPI: Patient is a 82 year old female now seen out 6 months having completed radiation therapy for stage IIIc mixed endometrial carcinoma composed of serous carcinoma and endometrioid adenocarcinoma status post TL H/BSO.[name] She had 3 positive pelvic nodes and also adjuvant chemotherapy with carboplatinum and paclitaxel discontinue segment neuropathy. She underwent external pelvic radiation as well as vaginal brachytherapy. She seen today in routine follow-up is doing well she specifically denies vaginal discharge she has urinary incontinence which seems to be stress incontinence which she is had for several years. Also has some exacerbation of lower back pain again unrelated to her tumor diagnosis. 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  COMPLICATIONS OF TREATMENT: none  FOLLOW UP COMPLIANCE: keeps appointments   PHYSICAL EXAM:  BP (!) 143/76   Pulse (!) 51   Temp (!) 96.4 F (35.8 C)   Resp 20   Wt 203 lb 4.2 oz (92.2 kg)   BMI 34.89 kg/m  On speculum examination vaginal vault is clear bimanual examination shows no evidence of mass or nodularity vaginal apex parametrial region or rectum. Well-developed well-nourished patient in NAD. HEENT reveals PERLA, EOMI, discs not visualized.  Oral cavity is clear. No oral mucosal lesions are identified. Neck is clear without evidence of cervical or supraclavicular adenopathy. Lungs are clear to A&P. Cardiac examination is essentially unremarkable with regular rate and rhythm without murmur rub or thrill. Abdomen is benign with no organomegaly or masses noted. Motor sensory  and DTR levels are equal and symmetric in the upper and lower extremities. Cranial nerves II through XII are grossly intact. Proprioception is intact. No peripheral adenopathy or edema is identified. No motor or sensory levels are noted. Crude visual fields are within normal range.  RADIOLOGY RESULTS: No current films for review  PLAN: At the present time she continues to do well with no evidence of disease. I have emphasized to her her lower back pain is most likely degenerative changes in nature and not related to her cancer. I've also set up an appointment with Dr. Erlene Quan for evaluation of her urinary incontinence. Otherwise I'm please were overall progress. She continues close follow-up care with medical oncology and GYN oncology. I have asked to see her back in about 5 months for follow-up. Patient knows to call sooner with any concerns.  I would like to take this opportunity to thank you for allowing me to participate in the care of your patient.Armstead Peaks., MD

## 2016-05-29 ENCOUNTER — Ambulatory Visit: Payer: Medicare Other | Admitting: Urology

## 2016-05-29 ENCOUNTER — Encounter: Payer: Self-pay | Admitting: Urology

## 2016-05-29 VITALS — BP 130/74 | HR 56 | Ht 64.0 in | Wt 201.2 lb

## 2016-05-29 DIAGNOSIS — N3946 Mixed incontinence: Secondary | ICD-10-CM

## 2016-05-29 DIAGNOSIS — R32 Unspecified urinary incontinence: Secondary | ICD-10-CM | POA: Diagnosis not present

## 2016-05-29 LAB — URINALYSIS, COMPLETE
Bilirubin, UA: NEGATIVE
Glucose, UA: NEGATIVE
KETONES UA: NEGATIVE
Nitrite, UA: NEGATIVE
PROTEIN UA: NEGATIVE
SPEC GRAV UA: 1.025 (ref 1.005–1.030)
Urobilinogen, Ur: 0.2 mg/dL (ref 0.2–1.0)
pH, UA: 5 (ref 5.0–7.5)

## 2016-05-29 LAB — MICROSCOPIC EXAMINATION

## 2016-05-29 MED ORDER — MIRABEGRON ER 50 MG PO TB24: 50.0000 mg | ORAL_TABLET | Freq: Every day | ORAL | 11 refills | Status: DC

## 2016-05-29 NOTE — Progress Notes (Signed)
05/29/2016 3:23 PM   Susan [name] 1934-10-21 [id number]  Referring provider: [name], MD [phone] [address] [place], [place] [address]  Chief Complaint  Patient presents with  . New Patient (Initial Visit)    urinary incontinence     HPI: I was consulted to assess the patient urinary incontinence which I believe worsened after her radiation last summer. She may have had a little bit of stress incontinence prior. Now she can leak with coughing and sneezing and bending and lifting if her bladder is full. Her primary symptom is urge incontinence triggered by sitting to standing position and especially worse in the morning with foot on the floor syndrome. She does not have bedwetting. She now wears 3-4 pads a day moderately wet but soaked in the morning  She voids every 1-2 hours worse after taking Lasix. She has no nocturia. She had a hysterectomy followed by chemotherapy followed by radiation for endometrial cancer 18 months ago  She denies a history of previous GU surgery and urinary tract infections. She may have had kidney stones years ago. She has no neurologic issues. She developed a peripheral neuropathy in the right leg from chemotherapy  Modifying factors: There are no other modifying factors  Associated signs and symptoms: There are no other associated signs and symptoms Aggravating and relieving factors: There are no other aggravating or relieving factors Severity: Moderate Duration: Persistent     PMH: Past Medical History:  Diagnosis Date  . Bradycardia   . Coronary artery disease   . Heart murmur   . Hyperlipidemia   . Hypertension   . Myocardial infarction   . NSVT (nonsustained ventricular tachycardia) ([place])   . Personal history of chemotherapy    ENDOMETRIAL CA  . Personal history of radiation therapy    ENDOMETRIAL CA  . Uterine cancer ([place])    serrous    Surgical History: Past Surgical History:  Procedure Laterality Date  . ABDOMINAL  HYSTERECTOMY    . BREAST EXCISIONAL BIOPSY Right 1956   NEG  . LEFT HEART CATHETERIZATION WITH CORONARY ANGIOGRAM  10/19/2012   Procedure: LEFT HEART CATHETERIZATION WITH CORONARY ANGIOGRAM;  Surgeon: Troy Sine, MD;  Location: [facility] CATH LAB;  Service: Cardiovascular;;  . PERCUTANEOUS CORONARY STENT INTERVENTION (PCI-S)  10/19/2012   Procedure: PERCUTANEOUS CORONARY STENT INTERVENTION (PCI-S);  Surgeon: Troy Sine, MD;  Location: [facility] CATH LAB;  Service: Cardiovascular;;  . PERCUTANEOUS CORONARY STENT INTERVENTION (PCI-S) N/A 10/23/2012   Procedure: PERCUTANEOUS CORONARY STENT INTERVENTION (PCI-S);  Surgeon: Troy Sine, MD;  Location: [facility] CATH LAB;  Service: Cardiovascular;  Laterality: N/A;  . PERIPHERAL VASCULAR CATHETERIZATION N/A 05/05/2015   Procedure: Glori Luis Cath Insertion;  Surgeon: Algernon Huxley, MD;  Location: [place] [facility] CV LAB;  Service: Cardiovascular;  Laterality: N/A;    Home Medications:  Allergies as of 05/29/2016      Reactions   Ace Inhibitors Cough   Penicillins Rash      Medication List       Accurate as of 05/29/16  3:23 PM. Always use your most recent med list.          aspirin EC 81 MG tablet Take 81 mg by mouth daily.   atorvastatin 80 MG tablet Commonly known as:  LIPITOR Take 1 tablet (80 mg total) by mouth daily at 6 PM.   atorvastatin 40 MG tablet Commonly known as:  LIPITOR Take 2 tablets (80 mg total) by mouth daily.   BRILINTA 60 MG Tabs tablet  Generic drug:  ticagrelor TAKE 1 TABLET BY MOUTH TWICE A DAY   ezetimibe 10 MG tablet Commonly known as:  ZETIA Take 1 tablet (10 mg total) by mouth daily.   furosemide 20 MG tablet Commonly known as:  LASIX TAKE 1 TABLET BY MOUTH DAILY.   gabapentin 100 MG capsule Commonly known as:  NEURONTIN 1 capsule in am and 1 capsules in pm   isosorbide mononitrate 30 MG 24 hr tablet Commonly known as:  IMDUR Take 1 tablet (30 mg total) by mouth daily.   lidocaine-prilocaine cream Commonly known as:   EMLA Apply to affected area once   loperamide 2 MG tablet Commonly known as:  IMODIUM A-D Take 2 mg by mouth 4 (four) times daily as needed for diarrhea or loose stools.   losartan 50 MG tablet Commonly known as:  COZAAR TAKE 1 TABLET EVERY DAY   multivitamin with minerals Tabs tablet Take 1 tablet by mouth daily.   NITROSTAT 0.4 MG SL tablet Generic drug:  nitroGLYCERIN PLACE 1 TABLET UNDER THE TOUGUE EVERY 5 MINUTES FOR UP TO 3 DOSES AS NEEDED FOR CHEST PAIN   omeprazole 20 MG capsule Commonly known as:  PRILOSEC Take 20 mg by mouth daily.   pyridOXINE 100 MG tablet Commonly known as:  VITAMIN B-6 Take 100 mg by mouth 2 (two) times daily.   Vitamin D (Cholecalciferol) 1000 units Caps Take 1 capsule by mouth daily.       Allergies:  Allergies  Allergen Reactions  . Ace Inhibitors Cough  . Penicillins Rash    Family History: Family History  Problem Relation Age of Onset  . Heart disease Mother   . Breast cancer Mother 53  . Heart disease Father   . Diabetes Father   . Diabetes Brother   . Stroke Brother     Social History:  reports that she has never smoked. She has never used smokeless tobacco. She reports that she drinks about 0.6 oz of alcohol per week . She reports that she does not use drugs.  ROS: UROLOGY Frequent Urination?: No Hard to postpone urination?: No Burning/pain with urination?: No Get up at night to urinate?: No Leakage of urine?: Yes Urine stream starts and stops?: No Trouble starting stream?: No Do you have to strain to urinate?: No Blood in urine?: No Urinary tract infection?: No Sexually transmitted disease?: No Injury to kidneys or bladder?: No Painful intercourse?: No Weak stream?: No Currently pregnant?: No Vaginal bleeding?: No Last menstrual period?: No  Gastrointestinal Nausea?: No Vomiting?: No Indigestion/heartburn?: No Diarrhea?: No Constipation?: No  Constitutional Fever: No Night sweats?: No Weight  loss?: No Fatigue?: No  Skin Skin rash/lesions?: No Itching?: No  Eyes Blurred vision?: No Double vision?: No  Ears/Nose/Throat Sore throat?: No Sinus problems?: No  Hematologic/Lymphatic Swollen glands?: No Easy bruising?: No  Cardiovascular Leg swelling?: Yes Chest pain?: No  Respiratory Cough?: No Shortness of breath?: No  Endocrine Excessive thirst?: No  Musculoskeletal Back pain?: Yes Joint pain?: No  Neurological Headaches?: No Dizziness?: No  Psychologic Depression?: No Anxiety?: No  Physical Exam: BP 130/74   Pulse (!) 56   Ht 5\' 4"  (1.626 m)   Wt 91.3 kg (201 lb 3.2 oz)   BMI 34.54 kg/m   Constitutional:  Alert and oriented, No acute distress. HEENT: [place] AT, moist mucus membranes.  Trachea midline, no masses. Cardiovascular: No clubbing, cyanosis, or edema. Respiratory: Normal respiratory effort, no increased work of breathing. GI: Abdomen is soft, nontender, nondistended,  no abdominal masses GU: No CVA tenderness. Well supported bladder neck with a mild positive cough test Skin: No rashes, bruises or suspicious lesions. Lymph: No cervical or inguinal adenopathy. Neurologic: Grossly intact, no focal deficits, moving all 4 extremities. Psychiatric: Normal mood and affect.  Laboratory Data: Lab Results  Component Value Date   WBC 6.0 04/25/2016   HGB 11.6 (L) 04/25/2016   HCT 33.7 (L) 04/25/2016   MCV 89.4 04/25/2016   PLT 225 04/25/2016    Lab Results  Component Value Date   CREATININE 0.87 04/25/2016    No results found for: PSA  No results found for: TESTOSTERONE  Lab Results  Component Value Date   HGBA1C 5.7 (H) 10/19/2012    Urinalysis No results found for: COLORURINE, APPEARANCEUR, LABSPEC, [place], GLUCOSEU, HGBUR, BILIRUBINUR, KETONESUR, PROTEINUR, UROBILINOGEN, NITRITE, LEUKOCYTESUR  Pertinent Imaging: none  Assessment & Plan:  The patient has mixed incontinence were primarily urgency incontinence with typical  triggers. It was likely worsen post radiation and hysterectomy. Has not been previously treated with OAB medications.   Because I did not think it would change the management I did not perform cystoscopy yet and I did not recommend urodynamics. I will call if the urine culture is positive. The patient will be started on the beta 3 agonists with samples and prescription at 50 mg and reassess in one month. If the patient's stress incontinence was ever treated a urethral injectable would be the treatment of choice  1. Urinary incontinence, unspecified type 2. Mixed incontinence 3. Urinary frequency   - Urinalysis, Complete - Bladder Scan (Post Void Residual) in office   No Follow-up on file.  Reece Packer, MD  [facility] [address] [place], [place] [address] [phone]

## 2016-06-06 ENCOUNTER — Inpatient Hospital Stay: Payer: Medicare Other | Attending: Hematology and Oncology

## 2016-06-06 DIAGNOSIS — Z95828 Presence of other vascular implants and grafts: Secondary | ICD-10-CM

## 2016-06-06 DIAGNOSIS — Z9221 Personal history of antineoplastic chemotherapy: Secondary | ICD-10-CM | POA: Diagnosis not present

## 2016-06-06 DIAGNOSIS — T451X5A Adverse effect of antineoplastic and immunosuppressive drugs, initial encounter: Secondary | ICD-10-CM

## 2016-06-06 DIAGNOSIS — Z923 Personal history of irradiation: Secondary | ICD-10-CM | POA: Diagnosis not present

## 2016-06-06 DIAGNOSIS — C541 Malignant neoplasm of endometrium: Secondary | ICD-10-CM | POA: Insufficient documentation

## 2016-06-06 DIAGNOSIS — Z90710 Acquired absence of both cervix and uterus: Secondary | ICD-10-CM | POA: Insufficient documentation

## 2016-06-06 DIAGNOSIS — G620 Drug-induced polyneuropathy: Secondary | ICD-10-CM

## 2016-06-06 LAB — CBC
HCT: 34.1 % — ABNORMAL LOW (ref 35.0–47.0)
HEMOGLOBIN: 11.8 g/dL — AB (ref 12.0–16.0)
MCH: 31.1 pg (ref 26.0–34.0)
MCHC: 34.6 g/dL (ref 32.0–36.0)
MCV: 90 fL (ref 80.0–100.0)
Platelets: 231 10*3/uL (ref 150–440)
RBC: 3.78 MIL/uL — ABNORMAL LOW (ref 3.80–5.20)
RDW: 14.5 % (ref 11.5–14.5)
WBC: 4.8 10*3/uL (ref 3.6–11.0)

## 2016-06-06 LAB — HEPATIC FUNCTION PANEL
ALT: 18 U/L (ref 14–54)
AST: 33 U/L (ref 15–41)
Albumin: 3.9 g/dL (ref 3.5–5.0)
Alkaline Phosphatase: 125 U/L (ref 38–126)
Bilirubin, Direct: 0.1 mg/dL (ref 0.1–0.5)
Indirect Bilirubin: 0.6 mg/dL (ref 0.3–0.9)
Total Bilirubin: 0.7 mg/dL (ref 0.3–1.2)
Total Protein: 6.5 g/dL (ref 6.5–8.1)

## 2016-06-06 MED ORDER — SODIUM CHLORIDE 0.9% FLUSH
10.0000 mL | INTRAVENOUS | Status: AC | PRN
  Administered 2016-06-06: 10 mL via INTRAVENOUS
  Filled 2016-06-06: qty 10

## 2016-06-06 MED ORDER — HEPARIN SOD (PORK) LOCK FLUSH 100 UNIT/ML IV SOLN
500.0000 [IU] | Freq: Once | INTRAVENOUS | Status: AC
  Administered 2016-06-06: 500 [IU] via INTRAVENOUS

## 2016-06-15 ENCOUNTER — Ambulatory Visit (INDEPENDENT_AMBULATORY_CARE_PROVIDER_SITE_OTHER): Payer: Medicare Other | Admitting: Cardiovascular Disease

## 2016-06-15 ENCOUNTER — Encounter: Payer: Self-pay | Admitting: Cardiovascular Disease

## 2016-06-15 VITALS — BP 137/72 | HR 61 | Ht 64.0 in | Wt 202.4 lb

## 2016-06-15 DIAGNOSIS — G6282 Radiation-induced polyneuropathy: Secondary | ICD-10-CM

## 2016-06-15 DIAGNOSIS — C541 Malignant neoplasm of endometrium: Secondary | ICD-10-CM | POA: Diagnosis not present

## 2016-06-15 DIAGNOSIS — I255 Ischemic cardiomyopathy: Secondary | ICD-10-CM | POA: Diagnosis not present

## 2016-06-15 DIAGNOSIS — I2510 Atherosclerotic heart disease of native coronary artery without angina pectoris: Secondary | ICD-10-CM | POA: Diagnosis not present

## 2016-06-15 DIAGNOSIS — E785 Hyperlipidemia, unspecified: Secondary | ICD-10-CM | POA: Diagnosis not present

## 2016-06-15 DIAGNOSIS — Z79899 Other long term (current) drug therapy: Secondary | ICD-10-CM | POA: Diagnosis not present

## 2016-06-15 DIAGNOSIS — I447 Left bundle-branch block, unspecified: Secondary | ICD-10-CM | POA: Diagnosis not present

## 2016-06-15 DIAGNOSIS — G4733 Obstructive sleep apnea (adult) (pediatric): Secondary | ICD-10-CM

## 2016-06-15 DIAGNOSIS — I10 Essential (primary) hypertension: Secondary | ICD-10-CM | POA: Diagnosis not present

## 2016-06-15 DIAGNOSIS — Z9989 Dependence on other enabling machines and devices: Secondary | ICD-10-CM

## 2016-06-15 NOTE — Patient Instructions (Addendum)
Your physician recommends that you return for lab work fasting.   Your physician wants you to follow-up in: 6 months or sooner if needed. You will receive a reminder letter in the mail two months in advance. If you don't receive a letter, please call our office to schedule the follow-up appointment.  If you need a refill on your cardiac medications before your next appointment, please call your pharmacy.   

## 2016-06-17 NOTE — Progress Notes (Signed)
Patient ID: Susan [name], female   DOB: 1934-08-08, 82 y.o.   MRN: [id number]      HPI: Susan [name] is a 82 y.o. female who presents to the office today for a one year follow-up cardiology and sleep clinic evaluation.  Susan [name] is originally from [place], [place] and now resides in [place]. On 10/19/2012 she presented [facility] in transfer from [facility] with an anterior wall ST segment elevation myocardial infarction. She had chest pain of greater than 12 hours duration. I performed emergent cardiac catheterization  where her LAD was found to be totally occluded.  She underwent stenting of her LAD extending from the ostium to mid region with 3.0x38 mm and 3.0x16 mm Promus Premier DES stents. Initially she had significant wall motion abnormality involving the LAD territory. She also was found to have a 95% mid AV groove circumflex stenosis.  On 10/23/2012 she underwent successful staged intervention with insertion of a 2.25x12 mm Promus premier DES stent into the circumflex vessel. Subsequently she had done well. She did develop some hypotension leading to reduction in some of her medications.  She was enrolled in the [facility] [place] cardiac rehabilitation program. An exercise Myoview study on 12/03/2012 demonstrated significant salvage of myocardium with only a small area of distal antero-apical, apical, septal and  apical scar without evidence for ischemia. Post-rest ejection fraction was 66% and there was evidence for apical akinesis. Laboratory revealed an increased LDL particle number at 1352 despite being on atorvastatin 80 mg. She was contacted and Zetia 10 mg was just added to this atorvastatin dose. She did have 735 small LDL particles and a calculated LDL of 90 with HDL 40 and triglycerides 119.  She had developed hematuria and was found to have left kidney stones.  She underwent lithotripsy for her multiple kidney stones. A nuclear perfusion study prior to  potential discontinuance of dual anti-platelet therapy on 11/05/2013  was interpreted as low risk, but did show extensive scar in the distal LAD to return distribution.  Post-rest ejection fraction was 40%.  There was evidence for apical akinesis/mild dyskinesis.  She has been documented to have episodes of nonsustained ventricular tachycardia and has seen Dr. commits..  Her bradycardia has been more profound while sleeping.  She was able to exercise and walk without limitation other than back discomfort.  She was evaluated for possible indication for pacemaker was not felt that a pacemaker is indicated presently.  Because of her bradycardia she has not been on beta blocker therapy.  She has had episodes of nonsustained VT noted on a CardioNet monitor.  When I I last saw her admited that her sleep was poor and nonrestorative.  She snored loudly and required frequent daytime naps.  He was sleeping at least 8 hours per night, but poor quality.  She underwent a split-night protocol sleep study on 03/08/2015 and was found to have severe sleep apnea with an AHI of 35.8 overall and more severe with REM sleep with an AHI of 67.5 per hour.  She had significant oxygen desaturation to a nadir of 77%.  There was loud snoring.  I recommended tai chi CPAP therapy at 9 cm water pressure.  Since initiating CPAP therapy.  She has felt well.  She is no longer having to take naps.  She is using choice for DME company.  When I saw her one year ago, she was meeting Medicare compliance. A download was obtained from 05/01/2015 through 05/30/2015 with usage stays at  93% and 70% of days greater than 4 hours.  Her AHI was excellent at 1.9 with a set pressure of 9 cm.    Over the past year, she was diagnosed with endometrial cancer and underwent a total hysterectomy.  She also has a Port-A-Cath that had undergone chemotherapy and also completed 25 radiation remains to her body and 3 intravaginally..  She has developed a neuropathy for  which he takes gabapentin.  He is unaware of chest pain or palpitations.    This past year, she continues to admit to 100% use with CPAP.  A download from 06/12/2016 through 06/13/2016 100% of usage stays.  She has a ResMed air since 10 AutoSet unit set at a pressure of 9 cm.  Her AHI is 1.1.  She is using a Radiographer, therapeutic &Paykel Eson nasal mask.  There is no leak.    Epworth Sleepiness Scale score was calculated in the office today and this endorsed at 6, arguing against residual daytime sleepiness.  Resents for evaluation.   Past Medical History:  Diagnosis Date  . Bradycardia   . Coronary artery disease   . Heart murmur   . Hyperlipidemia   . Hypertension   . Myocardial infarction   . NSVT (nonsustained ventricular tachycardia) (HCC)   . Personal history of chemotherapy    ENDOMETRIAL CA  . Personal history of radiation therapy    ENDOMETRIAL CA  . Uterine cancer (HCC)    serrous    Past Surgical History:  Procedure Laterality Date  . ABDOMINAL HYSTERECTOMY    . BREAST EXCISIONAL BIOPSY Right 1956   NEG  . LEFT HEART CATHETERIZATION WITH CORONARY ANGIOGRAM  10/19/2012   Procedure: LEFT HEART CATHETERIZATION WITH CORONARY ANGIOGRAM;  Surgeon: Lennette Bihari, MD;  Location: [facility] CATH LAB;  Service: Cardiovascular;;  . PERCUTANEOUS CORONARY STENT INTERVENTION (PCI-S)  10/19/2012   Procedure: PERCUTANEOUS CORONARY STENT INTERVENTION (PCI-S);  Surgeon: Lennette Bihari, MD;  Location: [facility] CATH LAB;  Service: Cardiovascular;;  . PERCUTANEOUS CORONARY STENT INTERVENTION (PCI-S) N/A 10/23/2012   Procedure: PERCUTANEOUS CORONARY STENT INTERVENTION (PCI-S);  Surgeon: Lennette Bihari, MD;  Location: [facility] CATH LAB;  Service: Cardiovascular;  Laterality: N/A;  . PERIPHERAL VASCULAR CATHETERIZATION N/A 05/05/2015   Procedure: Shelda Pal Cath Insertion;  Surgeon: Annice Needy, MD;  Location: [facility] INVASIVE CV LAB;  Service: Cardiovascular;  Laterality: N/A;    Allergies  Allergen Reactions  . Ace Inhibitors Cough    . Penicillins Rash    Current Outpatient Prescriptions  Medication Sig Dispense Refill  . aspirin EC 81 MG tablet Take 81 mg by mouth daily.    [name] atorvastatin (LIPITOR) 80 MG tablet Take 1 tablet (80 mg total) by mouth daily at 6 PM. 90 tablet 1  . BRILINTA 60 MG TABS tablet TAKE 1 TABLET BY MOUTH TWICE A DAY 60 tablet 4  . ezetimibe (ZETIA) 10 MG tablet Take 1 tablet (10 mg total) by mouth daily. 90 tablet 2  . furosemide (LASIX) 20 MG tablet TAKE 1 TABLET BY MOUTH DAILY. 90 tablet 0  . gabapentin (NEURONTIN) 100 MG capsule Take by mouth. Pt takes 1 tablet in the A.M. And 2 tablets in the P.M.    . isosorbide mononitrate (IMDUR) 30 MG 24 hr tablet Take 1 tablet (30 mg total) by mouth daily. 90 tablet 2  . lidocaine-prilocaine (EMLA) cream Apply to affected area once 30 g 3  . loperamide (IMODIUM A-D) 2 MG tablet Take 2 mg by mouth 4 (four) times  daily as needed for diarrhea or loose stools.    [name] losartan (COZAAR) 50 MG tablet TAKE 1 TABLET EVERY DAY 90 tablet 0  . mirabegron ER (MYRBETRIQ) 50 MG TB24 tablet Take 1 tablet (50 mg total) by mouth daily. 30 tablet 11  . Multiple Vitamin (MULTIVITAMIN WITH MINERALS) TABS Take 1 tablet by mouth daily.    [name] NITROSTAT 0.4 MG SL tablet PLACE 1 TABLET UNDER THE TOUGUE EVERY 5 MINUTES FOR UP TO 3 DOSES AS NEEDED FOR CHEST PAIN 25 tablet 0  . omeprazole (PRILOSEC) 20 MG capsule Take 20 mg by mouth daily.    [name] pyridOXINE (VITAMIN B-6) 100 MG tablet Take 100 mg by mouth 2 (two) times daily.    . Vitamin D, Cholecalciferol, 1000 UNITS CAPS Take 1 capsule by mouth daily.     No current facility-administered medications for this visit.    Facility-Administered Medications Ordered in Other Visits  Medication Dose Route Frequency Provider Last Rate Last Dose  . sodium chloride flush (NS) 0.9 % injection 10 mL  10 mL Intravenous PRN Cammie Sickle, MD   10 mL at 06/06/16 1447    Social History   Social History  . Marital status: Married     Spouse name: N/A  . Number of children: N/A  . Years of education: N/A   Occupational History  . Not on file.   Social History Main Topics  . Smoking status: Never Smoker  . Smokeless tobacco: Never Used  . Alcohol use 0.6 oz/week    1 Glasses of wine per week  . Drug use: No  . Sexual activity: Yes   Other Topics Concern  . Not on file   Social History Narrative  . No narrative on file    Socially she is married. There is no tobacco use. She drinks alcohol rarely.  Family History  Problem Relation Age of Onset  . Heart disease Mother   . Breast cancer Mother 88  . Heart disease Father   . Diabetes Father   . Diabetes Brother   . Stroke Brother     ROS General: Negative; No fevers, chills, or night sweats;  HEENT: Negative; No changes in vision or hearing, sinus congestion, difficulty swallowing Pulmonary: Negative; No cough, wheezing, shortness of breath, hemoptysis Cardiovascular: Negative; No chest pain, presyncope, syncope, palpatations GI: Positive for GERD; No nausea, vomiting, diarrhea, or abdominal pain GU: Positive for kidney stones, contributing to left flank pain intermittently; No dysuria, hematuria, or difficulty voiding Musculoskeletal: Negative; no myalgias, joint pain, or weakness Hematologic/Oncology: Positive for endometrial cancer, status post chemotherapy and radiation treatments. Endocrine: Negative; no heat/cold intolerance; no diabetes Neuro: Negative; no changes in balance, headaches Skin: Negative; No rashes or skin lesions Psychiatric: Negative; No behavioral problems, depression Sleep: Positive for obstructive sleep apnea, on CPAP therapy.  No breakthrough snoring.  No daytime sleepiness. Other comprehensive 14 point system review is negative.   PE BP 137/72   Pulse 61   Ht '5\' 4"'$  (1.626 m)   Wt 202 lb 6.4 oz (91.8 kg)   SpO2 96%   BMI 34.74 kg/m   Repeat blood pressure by me 120/76.  Wt Readings from Last 3 Encounters:  06/15/16  202 lb 6.4 oz (91.8 kg)  05/29/16 201 lb 3.2 oz (91.3 kg)  05/10/16 203 lb 4.2 oz (92.2 kg)   General: Alert, oriented, no distress. He had lost her hair with chemotherapy which is now grown back and is now white.  Previously,  she had dyed her hair blonde. Skin: normal turgor, no rashes HEENT: Normocephalic, atraumatic. Pupils round and reactive; sclera anicteric;no lid lag.  Nose without nasal septal hypertrophy Mouth/Parynx benign; Mallinpatti scale 3 Neck: No JVD, no carotid briuts; normal upstroke Lungs: clear to ausculatation and percussion; no wheezing or rales Chest wall: No tenderness to palpation Heart: RRR, s1 s2 normal 1/6 systolic murmur, no S3 or S4 gallop.  No diastolic.  No rubs thrills or heaves. Abdomen: soft, nontender; no hepatosplenomehaly, BS+; abdominal aorta nontender and not dilated by palpation. Back: No CVA tenderness Pulses 2+ Extremities: no clubbing cyanosis or edema, Homan's sign negative  Neurologic: grossly nonfocal Psychological: Normal affect and mood  ECG (independently read by me): This bradycardia 57 bpm.  First-degree AV block with a PR interval 314 ms. .  She now has a left bundle-branch block which is new since her prior tracing.  March 2017 ECG (independently read by me): Sinus bradycardia 52 bpm with first-degree block.  PR interval 284 ms.  LVH by voltage.  QS V1 through V3 concordant with old anteroseptal MI.  March 2016 ECG (independently read by me): Normal sinus rhythm.  Old anteroseptal MI.  First-degree AV block.  October 27, 2013 ECG (independently read by me): Normal sinus rhythm at 60 beats per minute.  First degree AV block with PR interval at 276 ms.  Old anteroseptal MI with QS complex in V1 and V2.  EKG (independently read by me): Sinus rhythm at 55 beats per minute. First degree AV block. Poor progression compatible with her prior septal infarction. T-wave changes anterolaterally.  LABS: BMP Latest Ref Rng & Units 04/25/2016  01/21/2016 10/21/2015  Glucose 65 - 99 mg/dL 120(H) 120(H) 142(H)  BUN 6 - 20 mg/dL 22(H) 21(H) 25(H)  Creatinine 0.44 - 1.00 mg/dL 0.87 0.82 0.84  Sodium 135 - 145 mmol/L 138 138 139  Potassium 3.5 - 5.1 mmol/L 3.7 3.6 3.4(L)  Chloride 101 - 111 mmol/L 107 103 106  CO2 22 - 32 mmol/L _0 Calcium 8.9 - 10.3 mg/dL 9.5 9.5 9.4   Hepatic Function Latest Ref Rng & Units 06/06/2016 04/25/2016 01/21/2016  Total Protein 6.5 - 8.1 g/dL 6.5 6.6 6.6  Albumin 3.5 - 5.0 g/dL 3.9 4.0 4.0  AST 15 - 41 U/L 33 29 33  ALT 14 - 54 U/L _1 Alk Phosphatase 38 - 126 U/L 125 132(H) 99  Total Bilirubin 0.3 - 1.2 mg/dL 0.7 0.7 0.6  Bilirubin, Direct 0.1 - 0.5 mg/dL 0.1 - -   CBC Latest Ref Rng & Units 06/06/2016 04/25/2016 01/21/2016  WBC 3.6 - 11.0 K/uL 4.8 6.0 5.5  Hemoglobin 12.0 - 16.0 g/dL 11.8(L) 11.6(L) 11.6(L)  Hematocrit 35.0 - 47.0 % 34.1(L) 33.7(L) 33.8(L)  Platelets 150 - 440 K/uL 231 225 214   Lab Results  Component Value Date   MCV 90.0 06/06/2016   MCV 89.4 04/25/2016   MCV 90.6 01/21/2016   Lab Results  Component Value Date   TSH 1.167 05/28/2014   Lab Results  Component Value Date   HGBA1C 5.7 (H) 10/19/2012   Lipid Panel     Component Value Date/Time   CHOL 129 06/18/2015 1015   CHOL 141 04/16/2013 0924   TRIG 125 06/18/2015 1015   TRIG 112 04/16/2013 0924   HDL 61 06/18/2015 1015   HDL 53 04/16/2013 0924   CHOLHDL 2.1 06/18/2015 1015   VLDL 25 06/18/2015 1015   LDLCALC 43 06/18/2015 1015  [place] 66 04/16/2013 0924   RADIOLOGY: No results found.  IMPRESSION:  1. Essential hypertension   2. Cardiomyopathy, ischemic: EF 35-40% by echo post-MI 10/20/12   3. Coronary artery disease involving native coronary artery of native heart without angina pectoris   4. OSA on CPAP   5. Dyslipidemia   6. Endometrial cancer ([place])   7. Left bundle branch block   8. 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 Medication management     ASSESSMENT AND PLAN: Ms.  Carole [name] is a 82 year-old female who presented to [facility] on 10/19/2012 with a STEMI after an approximate 12 hour delay with back and chest discomfort. When she arrived to [facility] she had excellent door to balloon time at 27 minutes in the setting of an anterior wall ST segment elevation myocardial infarction. She underwent successful stenting of her LAD and because of diffuse disease tandem DES stents were inserted.  She underwent staged intervention to the circumflex  4 days later.. She had concomitant 50 - 60% RCA narrowing and a large dominant right coronary artery.  She has a history of ACE inhibitor induced cough, but has tolerated ARB therapy with losartan .  A nuclear perfusion study  demonstrated significant salvage of myocardium in the majority of the LAD territory and showed distal apical scar.  She continues to be on dual antiplatelet therapy and now is on the reduced dose of Brilinta per [employer] trial data.  She was diagnosed with severe obstructive sleep apnea with an overall AHI of 35.8 per hour and for more severe doing rems sleep with an AHI of 67.5 per hour.  Her initial study.  There was significant oxygen desaturation to a nadir of 77% and she had very loud snoring.  She continues to meet Medicare compliance standards with reference of CPAP use at 100% of the days.  She is averaging over 7 hours of sleep per night.  On a set pressure of 9 cm, AHI is excellent.  There is no leak.  Her blood pressure today is controlled on losartan 50 mg, furosemide 80 mg (but she did not take this today), and isosorbide 30 mg.  Atorvastatin 80 mg and Zetia 10 mg for hyperlipidemia with target LDL of less than 70.  She continues to be on dual antiplatelet therapy without bleeding.  Developed a peripheral neuropathy following her radiation and chemotherapy and continues to take gabapentin.  Her GERD is controlled with omeprazole.  Her ECG today shows sinus rhythm with prolonged first-degree heart  block and she has developed a branch block since her ECG one year previously.  Asymptomatic from a cardiovascular standpoint.  I will see her in 6 months for follow-up evaluation. Troy Sine, MD, [facility]  06/17/2016 10:42 PM

## 2016-06-19 LAB — TSH: TSH: 0.97 m[IU]/L

## 2016-06-19 LAB — COMPREHENSIVE METABOLIC PANEL
ALBUMIN: 4 g/dL (ref 3.6–5.1)
ALK PHOS: 132 U/L — AB (ref 33–130)
ALT: 12 U/L (ref 6–29)
AST: 24 U/L (ref 10–35)
BILIRUBIN TOTAL: 0.6 mg/dL (ref 0.2–1.2)
BUN: 21 mg/dL (ref 7–25)
CALCIUM: 9.8 mg/dL (ref 8.6–10.4)
CO2: 30 mmol/L (ref 20–31)
CREATININE: 0.97 mg/dL — AB (ref 0.60–0.93)
Chloride: 107 mmol/L (ref 98–110)
GLUCOSE: 93 mg/dL (ref 65–99)
Potassium: 4 mmol/L (ref 3.5–5.3)
SODIUM: 142 mmol/L (ref 135–146)
Total Protein: 6.3 g/dL (ref 6.1–8.1)

## 2016-06-19 LAB — LIPID PANEL
Cholesterol: 120 mg/dL (ref <200)
HDL: 54 mg/dL (ref >50)
LDL CALC: 46 mg/dL (ref <100)
Total CHOL/HDL Ratio: 2.2 Ratio (ref <5.0)
Triglycerides: 99 mg/dL (ref <150)
VLDL: 20 mg/dL (ref <30)

## 2016-06-19 LAB — CBC
HCT: 36.8 % (ref 35.0–45.0)
Hemoglobin: 12.4 g/dL (ref 11.7–15.5)
MCH: 30.8 pg (ref 27.0–33.0)
MCHC: 33.7 g/dL (ref 32.0–36.0)
MCV: 91.5 fL (ref 80.0–100.0)
MPV: 8.8 fL (ref 7.5–12.5)
PLATELETS: 215 10*3/uL (ref 140–400)
RBC: 4.02 MIL/uL (ref 3.80–5.10)
RDW: 14 % (ref 11.0–15.0)
WBC: 5.1 10*3/uL (ref 3.8–10.8)

## 2016-07-03 ENCOUNTER — Encounter: Payer: Self-pay | Admitting: Urology

## 2016-07-03 ENCOUNTER — Ambulatory Visit: Payer: Medicare Other | Admitting: Urology

## 2016-07-03 VITALS — BP 116/71 | HR 50 | Wt 210.3 lb

## 2016-07-03 DIAGNOSIS — R32 Unspecified urinary incontinence: Secondary | ICD-10-CM

## 2016-07-03 MED ORDER — SOLIFENACIN SUCCINATE 5 MG PO TABS: 5.0000 mg | ORAL_TABLET | Freq: Every day | ORAL | 11 refills | Status: DC

## 2016-07-03 MED ORDER — FESOTERODINE FUMARATE ER 8 MG PO TB24: 8.0000 mg | ORAL_TABLET | Freq: Every day | ORAL | 11 refills | Status: DC

## 2016-07-03 NOTE — Progress Notes (Signed)
07/03/2016 2:30 PM   Susan [name] 08-26-1934 [id number]  Referring provider: [name], MD [phone] [place], [place] [address]  Chief Complaint  Patient presents with  . Follow-up    urinary incontinence     HPI: I was consulted to assess the patient urinary incontinence which I believe worsened after her radiation last summer. She may have had a little bit of stress incontinence prior. Now she can leak with coughing and sneezing and bending and lifting if her bladder is full. Her primary symptom is urge incontinence triggered by sitting to standing position and especially worse in the morning with foot on the floor syndrome. She does not have bedwetting. She now wears 3-4 pads a day moderately wet but soaked in the morning  She voids every 1-2 hours worse after taking Lasix. She has no nocturia. She had a hysterectomy followed by chemotherapy followed by radiation for endometrial cancer 18 months ago  Well supported bladder neck with a mild positive cough test  The patient has mixed incontinence were primarily urgency incontinence with typical triggers. It was likely worsen post radiation and hysterectomy.   Because I did not think it would change the management I did not perform cystoscopy yet and I did not recommend urodynamics. I will call if the urine culture is positive. The patient will be started on the beta 3 agonists with samples and prescription at 50 mg and reassess in one month. If the patient's stress incontinence was ever treated a urethral injectable would be the treatment of choice   PMH: Past Medical History:  Diagnosis Date  . Bradycardia   . Coronary artery disease   . Heart murmur   . Hyperlipidemia   . Hypertension   . Myocardial infarction   . NSVT (nonsustained ventricular tachycardia) ([place])   . Personal history of chemotherapy    ENDOMETRIAL CA  . Personal history of radiation therapy    ENDOMETRIAL CA  . Uterine cancer ([place])    serrous    Surgical History: Past Surgical History:  Procedure Laterality Date  . ABDOMINAL HYSTERECTOMY    . BREAST EXCISIONAL BIOPSY Right 1956   NEG  . LEFT HEART CATHETERIZATION WITH CORONARY ANGIOGRAM  10/19/2012   Procedure: LEFT HEART CATHETERIZATION WITH CORONARY ANGIOGRAM;  Surgeon: Troy Sine, MD;  Location: [facility] CATH LAB;  Service: Cardiovascular;;  . PERCUTANEOUS CORONARY STENT INTERVENTION (PCI-S)  10/19/2012   Procedure: PERCUTANEOUS CORONARY STENT INTERVENTION (PCI-S);  Surgeon: Troy Sine, MD;  [facility] CATH LAB;  Service: Cardiovascular;;  . PERCUTANEOUS CORONARY STENT INTERVENTION (PCI-S) N/A 10/23/2012   Procedure: PERCUTANEOUS CORONARY STENT INTERVENTION (PCI-S);  Surgeon: Troy Sine, MD;  Location: [facility] CATH LAB;  Service: Cardiovascular;  Laterality: N/A;  . PERIPHERAL VASCULAR CATHETERIZATION N/A 05/05/2015   Procedure: Glori Luis Cath Insertion;  Surgeon: Algernon Huxley, MD;  Location: [place] CV LAB;  Service: Cardiovascular;  Laterality: N/A;    Home Medications:  Allergies as of 07/03/2016      Reactions   Ace Inhibitors Cough   Penicillins Rash      Medication List       Accurate as of 07/03/16  2:30 PM. Always use your most recent med list.          aspirin EC 81 MG tablet Take 81 mg by mouth daily.   atorvastatin 80 MG tablet Commonly known as:  LIPITOR Take 1 tablet (80 mg total) by mouth daily at 6 PM.   BRILINTA 60  MG Tabs tablet Generic drug:  ticagrelor TAKE 1 TABLET BY MOUTH TWICE A DAY   ezetimibe 10 MG tablet Commonly known as:  ZETIA Take 1 tablet (10 mg total) by mouth daily.   furosemide 20 MG tablet Commonly known as:  LASIX TAKE 1 TABLET BY MOUTH DAILY.   gabapentin 100 MG capsule Commonly known as:  NEURONTIN Take by mouth. Pt takes 1 tablet in the A.M. And 2 tablets in the P.M.   isosorbide mononitrate 30 MG 24 hr tablet Commonly known as:  IMDUR Take 1 tablet (30 mg total) by mouth daily.     lidocaine-prilocaine cream Commonly known as:  EMLA Apply to affected area once   loperamide 2 MG tablet Commonly known as:  IMODIUM A-D Take 2 mg by mouth 4 (four) times daily as needed for diarrhea or loose stools.   losartan 50 MG tablet Commonly known as:  COZAAR TAKE 1 TABLET EVERY DAY   mirabegron ER 50 MG Tb24 tablet Commonly known as:  MYRBETRIQ Take 1 tablet (50 mg total) by mouth daily.   multivitamin with minerals Tabs tablet Take 1 tablet by mouth daily.   NITROSTAT 0.4 MG SL tablet Generic drug:  nitroGLYCERIN PLACE 1 TABLET UNDER THE TOUGUE EVERY 5 MINUTES FOR UP TO 3 DOSES AS NEEDED FOR CHEST PAIN   omeprazole 20 MG capsule Commonly known as:  PRILOSEC Take 20 mg by mouth daily.   pyridOXINE 100 MG tablet Commonly known as:  VITAMIN B-6 Take 100 mg by mouth 2 (two) times daily.   Vitamin D (Cholecalciferol) 1000 units Caps Take 1 capsule by mouth daily.       Allergies:  Allergies  Allergen Reactions  . Ace Inhibitors Cough  . Penicillins Rash    Family History: Family History  Problem Relation Age of Onset  . Heart disease Mother   . Breast cancer Mother 3  . Heart disease Father   . Diabetes Father   . Diabetes Brother   . Stroke Brother     Social History:  reports that she has never smoked. She has never used smokeless tobacco. She reports that she drinks about 0.6 oz of alcohol per week . She reports that she does not use drugs.  ROS: UROLOGY Frequent Urination?: No Hard to postpone urination?: No Burning/pain with urination?: No Get up at night to urinate?: No Leakage of urine?: Yes Urine stream starts and stops?: No Trouble starting stream?: No Do you have to strain to urinate?: No Blood in urine?: No Urinary tract infection?: No Sexually transmitted disease?: No Injury to kidneys or bladder?: No Painful intercourse?: No Weak stream?: No Currently pregnant?: No Vaginal bleeding?: No Last menstrual period?:  n  Gastrointestinal Nausea?: No Vomiting?: No Indigestion/heartburn?: No Diarrhea?: No Constipation?: No  Constitutional Fever: No Night sweats?: No Weight loss?: No Fatigue?: No  Skin Skin rash/lesions?: No Itching?: No  Eyes Blurred vision?: No Double vision?: No  Ears/Nose/Throat Sore throat?: No Sinus problems?: No  Hematologic/Lymphatic Swollen glands?: No Easy bruising?: No  Cardiovascular Leg swelling?: No Chest pain?: No  Respiratory Cough?: No Shortness of breath?: No  Endocrine Excessive thirst?: No  Musculoskeletal Back pain?: Yes Joint pain?: No  Neurological Headaches?: No Dizziness?: No  Psychologic Depression?: No Anxiety?: No  Physical Exam: BP 116/71   Pulse (!) 50   Wt 210 lb 4.8 oz (95.4 kg)   BMI 36.10 kg/m   Constitutional:  Alert and oriented, No acute distress. . Lymph: No cervical or inguinal  adenopathy. Neurologic: Grossly intact, no focal deficits, moving all 4 extremities. Psychiatric: Normal mood and affect.  Laboratory Data: Lab Results  Component Value Date   WBC 5.1 06/19/2016   HGB 12.4 06/19/2016   HCT 36.8 06/19/2016   MCV 91.5 06/19/2016   PLT 215 06/19/2016    Lab Results  Component Value Date   CREATININE 0.97 (H) 06/19/2016    No results found for: PSA  No results found for: TESTOSTERONE  Lab Results  Component Value Date   HGBA1C 5.7 (H) 10/19/2012    Urinalysis    Component Value Date/Time   APPEARANCEUR Cloudy (A) 05/29/2016 1537   GLUCOSEU Negative 05/29/2016 1537   BILIRUBINUR Negative 05/29/2016 1537   PROTEINUR Negative 05/29/2016 1537   NITRITE Negative 05/29/2016 1537   LEUKOCYTESUR 1+ (A) 05/29/2016 1537    Pertinent Imaging: nopne  Assessment & Plan:  Frequency and urgency incontinence persists with no benefit from the beta 3 agonist. He give her Toviaz 8 mg samples and prescription of Vesicare 5 mg samples and prescription and I will see her in 2 months. He might  need cystoscopy in the future. Refractory therapies aren't options  1. Urinary incontinence, unspecified type 2. Urinary frequency   No Follow-up on file.  Reece Packer, MD  [facility] [address] [place], [place] [address] [phone]

## 2016-07-05 ENCOUNTER — Other Ambulatory Visit: Payer: Self-pay | Admitting: Cardiovascular Disease

## 2016-07-05 NOTE — Telephone Encounter (Signed)
Rx(s) sent to pharmacy electronically.  

## 2016-07-15 ENCOUNTER — Other Ambulatory Visit: Payer: Self-pay | Admitting: Cardiovascular Disease

## 2016-07-18 ENCOUNTER — Other Ambulatory Visit: Payer: Medicare Other

## 2016-07-18 ENCOUNTER — Inpatient Hospital Stay: Payer: Medicare Other | Attending: Hematology and Oncology

## 2016-07-18 ENCOUNTER — Ambulatory Visit: Payer: Medicare Other | Admitting: Hematology and Oncology

## 2016-07-18 DIAGNOSIS — C541 Malignant neoplasm of endometrium: Secondary | ICD-10-CM | POA: Insufficient documentation

## 2016-07-18 DIAGNOSIS — Z452 Encounter for adjustment and management of vascular access device: Secondary | ICD-10-CM | POA: Diagnosis not present

## 2016-07-18 DIAGNOSIS — Z95828 Presence of other vascular implants and grafts: Secondary | ICD-10-CM

## 2016-07-18 DIAGNOSIS — Z923 Personal history of irradiation: Secondary | ICD-10-CM | POA: Insufficient documentation

## 2016-07-18 DIAGNOSIS — Z9221 Personal history of antineoplastic chemotherapy: Secondary | ICD-10-CM | POA: Diagnosis not present

## 2016-07-18 MED ORDER — HEPARIN SOD (PORK) LOCK FLUSH 100 UNIT/ML IV SOLN
500.0000 [IU] | Freq: Once | INTRAVENOUS | Status: AC
  Administered 2016-07-18: 500 [IU] via INTRAVENOUS

## 2016-07-18 MED ORDER — SODIUM CHLORIDE 0.9% FLUSH
10.0000 mL | INTRAVENOUS | Status: DC | PRN
  Administered 2016-07-18: 10 mL via INTRAVENOUS
  Filled 2016-07-18: qty 10

## 2016-08-03 ENCOUNTER — Other Ambulatory Visit: Payer: Self-pay | Admitting: Hematology and Oncology

## 2016-08-03 ENCOUNTER — Other Ambulatory Visit: Payer: Self-pay | Admitting: Cardiovascular Disease

## 2016-08-03 DIAGNOSIS — G63 Polyneuropathy in diseases classified elsewhere: Secondary | ICD-10-CM

## 2016-08-03 DIAGNOSIS — C541 Malignant neoplasm of endometrium: Secondary | ICD-10-CM

## 2016-08-03 DIAGNOSIS — C801 Malignant (primary) neoplasm, unspecified: Secondary | ICD-10-CM

## 2016-08-03 NOTE — Telephone Encounter (Signed)
REFILL 

## 2016-08-03 NOTE — Telephone Encounter (Signed)
The requested med directions differ from what is in Dr Drenda Freeze last note. Please advise if you want to order as requested or as ordered

## 2016-08-04 NOTE — Telephone Encounter (Signed)
REFILL 

## 2016-08-07 ENCOUNTER — Other Ambulatory Visit: Payer: Self-pay | Admitting: Cardiovascular Disease

## 2016-08-07 NOTE — Telephone Encounter (Signed)
Rx(s) sent to pharmacy electronically.  

## 2016-08-15 ENCOUNTER — Inpatient Hospital Stay (HOSPITAL_BASED_OUTPATIENT_CLINIC_OR_DEPARTMENT_OTHER): Payer: Medicare Other | Admitting: Hematology and Oncology

## 2016-08-15 ENCOUNTER — Encounter: Payer: Self-pay | Admitting: Hematology and Oncology

## 2016-08-15 ENCOUNTER — Inpatient Hospital Stay: Payer: Medicare Other | Attending: Hematology and Oncology

## 2016-08-15 VITALS — BP 136/72 | HR 55 | Temp 96.7°F | Resp 18 | Wt 197.2 lb

## 2016-08-15 DIAGNOSIS — M8580 Other specified disorders of bone density and structure, unspecified site: Secondary | ICD-10-CM

## 2016-08-15 DIAGNOSIS — Z923 Personal history of irradiation: Secondary | ICD-10-CM

## 2016-08-15 DIAGNOSIS — I10 Essential (primary) hypertension: Secondary | ICD-10-CM | POA: Insufficient documentation

## 2016-08-15 DIAGNOSIS — Z7982 Long term (current) use of aspirin: Secondary | ICD-10-CM

## 2016-08-15 DIAGNOSIS — Z95828 Presence of other vascular implants and grafts: Secondary | ICD-10-CM

## 2016-08-15 DIAGNOSIS — K219 Gastro-esophageal reflux disease without esophagitis: Secondary | ICD-10-CM | POA: Insufficient documentation

## 2016-08-15 DIAGNOSIS — M549 Dorsalgia, unspecified: Secondary | ICD-10-CM | POA: Insufficient documentation

## 2016-08-15 DIAGNOSIS — E785 Hyperlipidemia, unspecified: Secondary | ICD-10-CM | POA: Insufficient documentation

## 2016-08-15 DIAGNOSIS — M1990 Unspecified osteoarthritis, unspecified site: Secondary | ICD-10-CM

## 2016-08-15 DIAGNOSIS — I252 Old myocardial infarction: Secondary | ICD-10-CM

## 2016-08-15 DIAGNOSIS — Z90710 Acquired absence of both cervix and uterus: Secondary | ICD-10-CM | POA: Diagnosis not present

## 2016-08-15 DIAGNOSIS — Z8542 Personal history of malignant neoplasm of other parts of uterus: Secondary | ICD-10-CM | POA: Diagnosis not present

## 2016-08-15 DIAGNOSIS — R32 Unspecified urinary incontinence: Secondary | ICD-10-CM | POA: Diagnosis not present

## 2016-08-15 DIAGNOSIS — Z90722 Acquired absence of ovaries, bilateral: Secondary | ICD-10-CM

## 2016-08-15 DIAGNOSIS — T451X5A Adverse effect of antineoplastic and immunosuppressive drugs, initial encounter: Secondary | ICD-10-CM

## 2016-08-15 DIAGNOSIS — R1031 Right lower quadrant pain: Secondary | ICD-10-CM

## 2016-08-15 DIAGNOSIS — I2510 Atherosclerotic heart disease of native coronary artery without angina pectoris: Secondary | ICD-10-CM | POA: Insufficient documentation

## 2016-08-15 DIAGNOSIS — I472 Ventricular tachycardia: Secondary | ICD-10-CM

## 2016-08-15 DIAGNOSIS — Z79899 Other long term (current) drug therapy: Secondary | ICD-10-CM

## 2016-08-15 DIAGNOSIS — Z9221 Personal history of antineoplastic chemotherapy: Secondary | ICD-10-CM

## 2016-08-15 DIAGNOSIS — R001 Bradycardia, unspecified: Secondary | ICD-10-CM

## 2016-08-15 DIAGNOSIS — C541 Malignant neoplasm of endometrium: Secondary | ICD-10-CM

## 2016-08-15 DIAGNOSIS — G629 Polyneuropathy, unspecified: Secondary | ICD-10-CM

## 2016-08-15 DIAGNOSIS — I255 Ischemic cardiomyopathy: Secondary | ICD-10-CM | POA: Diagnosis not present

## 2016-08-15 DIAGNOSIS — G620 Drug-induced polyneuropathy: Secondary | ICD-10-CM

## 2016-08-15 LAB — CBC WITH DIFFERENTIAL/PLATELET
Basophils Absolute: 0 10*3/uL (ref 0–0.1)
Basophils Relative: 0 %
Eosinophils Absolute: 0.1 10*3/uL (ref 0–0.7)
Eosinophils Relative: 2 %
HCT: 35 % (ref 35.0–47.0)
Hemoglobin: 12.1 g/dL (ref 12.0–16.0)
Lymphocytes Relative: 21 %
Lymphs Abs: 0.9 10*3/uL — ABNORMAL LOW (ref 1.0–3.6)
MCH: 30.8 pg (ref 26.0–34.0)
MCHC: 34.5 g/dL (ref 32.0–36.0)
MCV: 89.1 fL (ref 80.0–100.0)
Monocytes Absolute: 0.6 10*3/uL (ref 0.2–0.9)
Monocytes Relative: 14 %
Neutro Abs: 2.8 10*3/uL (ref 1.4–6.5)
Neutrophils Relative %: 63 %
Platelets: 209 10*3/uL (ref 150–440)
RBC: 3.92 MIL/uL (ref 3.80–5.20)
RDW: 14.5 % (ref 11.5–14.5)
WBC: 4.4 10*3/uL (ref 3.6–11.0)

## 2016-08-15 LAB — COMPREHENSIVE METABOLIC PANEL
ALT: 18 U/L (ref 14–54)
AST: 33 U/L (ref 15–41)
Albumin: 4.1 g/dL (ref 3.5–5.0)
Alkaline Phosphatase: 132 U/L — ABNORMAL HIGH (ref 38–126)
Anion gap: 4 — ABNORMAL LOW (ref 5–15)
BUN: 16 mg/dL (ref 6–20)
CO2: 27 mmol/L (ref 22–32)
Calcium: 9.3 mg/dL (ref 8.9–10.3)
Chloride: 108 mmol/L (ref 101–111)
Creatinine, Ser: 0.85 mg/dL (ref 0.44–1.00)
GFR calc Af Amer: >60 mL/min (ref >60)
GFR calc non Af Amer: >60 mL/min (ref >60)
Glucose, Bld: 115 mg/dL — ABNORMAL HIGH (ref 65–99)
Potassium: 3.9 mmol/L (ref 3.5–5.1)
Sodium: 139 mmol/L (ref 135–145)
Total Bilirubin: 0.7 mg/dL (ref 0.3–1.2)
Total Protein: 6.5 g/dL (ref 6.5–8.1)

## 2016-08-15 MED ORDER — SODIUM CHLORIDE 0.9% FLUSH
10.0000 mL | INTRAVENOUS | Status: AC | PRN
  Administered 2016-08-15: 10 mL via INTRAVENOUS
  Filled 2016-08-15: qty 10

## 2016-08-15 MED ORDER — HEPARIN SOD (PORK) LOCK FLUSH 100 UNIT/ML IV SOLN
500.0000 [IU] | Freq: Once | INTRAVENOUS | Status: AC
  Administered 2016-08-15: 500 [IU] via INTRAVENOUS

## 2016-08-15 NOTE — Progress Notes (Signed)
[facility] day:  08/15/16  Chief Complaint: Susan [name] is a 82 y.o. female with stage IIIC uterine cancer who is seen for 3 month assessment.  HPI:  The patient was last seen in the medical oncology clinic on 04/25/2016.  At that time, she had some issues with her back since Christmas.  She had neuropathy in her feet.  Exam was stable.  CBC revealed a hematocrit of 33.7, hemoglobin 11.6, and MCV 89.4.  Alkaline phosphatase was 132.  CA125 was normal.  During the interim she describes "hanging in there". She has pain all the time. She denies any pain in her legs or feet. She describes "pins and needles" sensation.  She has right lower quadrant pain as well as back pain.  She states that physical therapy for 6 weeks did not help.  The patient is following up with Dr. Vikki Ports, urologist, regarding incontinence. She is using a Poise pad. She has follow-up on 09/04/2016. She is scheduled to see Dr. Theora Gianotti on 08/23/2016.   Past Medical History:  Diagnosis Date  . Bradycardia   . Coronary artery disease   . Heart murmur   . Hyperlipidemia   . Hypertension   . Myocardial infarction ([place])   . NSVT (nonsustained ventricular tachycardia) ([place])   . Personal history of chemotherapy    ENDOMETRIAL CA  . Personal history of radiation therapy    ENDOMETRIAL CA  . Uterine cancer ([place])    serrous    Past Surgical History:  Procedure Laterality Date  . ABDOMINAL HYSTERECTOMY    . BREAST EXCISIONAL BIOPSY Right 1956   NEG  . LEFT HEART CATHETERIZATION WITH CORONARY ANGIOGRAM  10/19/2012   Procedure: LEFT HEART CATHETERIZATION WITH CORONARY ANGIOGRAM;  Surgeon: Troy Sine, MD;  Location: [facility] CATH LAB;  Service: Cardiovascular;;  . PERCUTANEOUS CORONARY STENT INTERVENTION (PCI-S)  10/19/2012   Procedure: PERCUTANEOUS CORONARY STENT INTERVENTION (PCI-S);  Surgeon: Troy Sine, MD;  Location[facility] CATH LAB;  Service: Cardiovascular;;  .  PERCUTANEOUS CORONARY STENT INTERVENTION (PCI-S) N/A 10/23/2012   Procedure: PERCUTANEOUS CORONARY STENT INTERVENTION (PCI-S);  Surgeon: Troy Sine, MD;  Location: [facility] CATH LAB;  Service: Cardiovascular;  Laterality: N/A;  . PERIPHERAL VASCULAR CATHETERIZATION N/A 05/05/2015   Procedure: Glori Luis Cath Insertion;  Surgeon: Algernon Huxley, MD;  Location: [place] CV LAB;  Service: Cardiovascular;  Laterality: N/A;    Family History  Problem Relation Age of Onset  . Heart disease Mother   . Breast cancer Mother 53  . Heart disease Father   . Diabetes Father   . Diabetes Brother   . Stroke Brother     Social History:  reports that she has never smoked. She has never used smokeless tobacco. She reports that she drinks about 0.6 oz of alcohol per week . She reports that she does not use drugs.  She is originally from [place].  She lives in [place].  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  Allergies:  Allergies  Allergen Reactions  . Ace Inhibitors Cough  . Penicillins Rash    Current Medications: Current Outpatient Prescriptions  Medication Sig Dispense Refill  . aspirin EC 81 MG tablet Take 81 mg by mouth daily.    [name] atorvastatin (LIPITOR) 80 MG tablet Take 1 tablet (80 mg total) by mouth daily at 6 PM. 90 tablet 1  . BRILINTA 60 MG TABS tablet TAKE 1 TABLET BY MOUTH  TWICE A DAY 60 tablet 5  . ezetimibe (ZETIA) 10 MG tablet Take 1 tablet (10 mg total) by mouth daily. 90 tablet 2  . furosemide (LASIX) 20 MG tablet TAKE 1 TABLET BY MOUTH DAILY. 90 tablet 0  . gabapentin (NEURONTIN) 100 MG capsule Take by mouth. Pt takes 1 tablet in the A.M. And 2 tablets in the P.M.    . isosorbide mononitrate (IMDUR) 30 MG 24 hr tablet Take 1 tablet (30 mg total) by mouth daily. 90 tablet 2  . lidocaine-prilocaine (EMLA) cream Apply to affected area once 30 g 3  . loperamide (IMODIUM A-D) 2 MG tablet Take 2 mg by mouth 4 (four) times daily as needed for diarrhea or loose  stools.    [name] losartan (COZAAR) 50 MG tablet TAKE 1 TABLET BY MOUTH EVERY DAY 90 tablet 0  . Multiple Vitamin (MULTIVITAMIN WITH MINERALS) TABS Take 1 tablet by mouth daily.    [name] omeprazole (PRILOSEC) 20 MG capsule Take 20 mg by mouth daily.    [name] pyridOXINE (VITAMIN B-6) 100 MG tablet Take 100 mg by mouth 2 (two) times daily.    . Vitamin D, Cholecalciferol, 1000 UNITS CAPS Take 1 capsule by mouth daily.    . mirabegron ER (MYRBETRIQ) 50 MG TB24 tablet Take 1 tablet (50 mg total) by mouth daily. (Patient not taking: Reported on 07/03/2016) 30 tablet 11  . NITROSTAT 0.4 MG SL tablet PLACE 1 TABLET UNDER THE TOUGUE EVERY 5 MINUTES FOR UP TO 3 DOSES AS NEEDED FOR CHEST PAIN (Patient not taking: Reported on 07/03/2016) 25 tablet 0  . solifenacin (VESICARE) 5 MG tablet Take 1 tablet (5 mg total) by mouth daily. (Patient not taking: Reported on 08/15/2016) 30 tablet 11   No current facility-administered medications for this visit.    Facility-Administered Medications Ordered in Other Visits  Medication Dose Route Frequency Provider Last Rate Last Dose  . sodium chloride flush (NS) 0.9 % injection 10 mL  10 mL Intravenous PRN Cammie Sickle, MD   10 mL at 06/06/16 1447  . sodium chloride flush (NS) 0.9 % injection 10 mL  10 mL Intravenous PRN Cammie Sickle, MD   10 mL at 08/15/16 1404    Review of Systems:  GENERAL: "Hanging in there".  No fevers or sweats.  Weight up 3 pounds. PERFORMANCE STATUS (ECOG):  2 HEENT:  No visual changes, runny nose, sore throat, mouth sores or tenderness. Lungs: No shortness of breath or cough.  No hemoptysis.  Sleep apnea.   Cardiac:  No chest pain, palpitations, orthopnea, or PND.  MI in 2014. GI:  RLQ pain.  No nausea, vomiting, constipation, melena or hematochezia.  GU:  No urgency, frequency, dysuria, or hematuria. Musculoskeletal:  Back issues.  No joint pain.  No muscle tenderness. Extremities:  No pain or swelling. Skin:  No rashes or skin  changes. Neuro:  Neuropathy (feet and fingers).  No headache, focal weakness, balance or coordination issues. Endocrine:  No diabetes, thyroid issues, hot flashes or night sweats. Psych:  No mood changes, depression or anxiety. Pain:  Back pain. Review of systems:  All other systems reviewed and found to be negative.  Physical Exam: Blood pressure 136/72, pulse (!) 55, temperature (!) 96.7 F (35.9 C), temperature source Tympanic, resp. rate 18, weight 197 lb 4 oz (89.5 kg). GENERAL:  Well developed, well nourished, woman sitting comfortably in the exam room in no acute distress.  MENTAL STATUS:  Alert and oriented to person, place  and time. HEAD:  Short gray hair.  Normocephalic, atraumatic, face symmetric, no Cushingoid features. EYES:  Glasses.  Blue eyes.  Pupils equal round and reactive to light and accomodation.  No conjunctivitis or scleral icterus. ENT:  Oropharynx clear without lesion.  Tongue normal. Mucous membranes moist.  RESPIRATORY:  Clear to auscultation without rales, wheezes or rhonchi. CARDIOVASCULAR:  Regular rate and rhythm without murmur, rub or gallop. ABDOMEN:  Soft, non-tender, with active bowel sounds, and no hepatosplenomegaly.  No masses. Back:  Pain on palpation lower back (left side). SKIN:  No rashes, ulcers or lesions. EXTREMITIES: No edema, no skin discoloration or tenderness.  No palpable cords. LYMPH NODES: No palpable cervical, supraclavicular, axillary or inguinal adenopathy  NEUROLOGICAL: Appropriate. PSYCH:  Appropriate.   Infusion on 08/15/2016  Component Date Value Ref Range Status  . WBC 08/15/2016 4.4  3.6 - 11.0 K/uL Final  . RBC 08/15/2016 3.92  3.80 - 5.20 MIL/uL Final  . Hemoglobin 08/15/2016 12.1  12.0 - 16.0 g/dL Final  . HCT 08/15/2016 35.0  35.0 - 47.0 % Final  . MCV 08/15/2016 89.1  80.0 - 100.0 fL Final  . MCH 08/15/2016 30.8  26.0 - 34.0 pg Final  . MCHC 08/15/2016 34.5  32.0 - 36.0 g/dL Final  . RDW 08/15/2016 14.5  11.5 - 14.5  % Final  . Platelets 08/15/2016 209  150 - 440 K/uL Final  . Neutrophils Relative % 08/15/2016 63  % Final  . Neutro Abs 08/15/2016 2.8  1.4 - 6.5 K/uL Final  . Lymphocytes Relative 08/15/2016 21  % Final  . Lymphs Abs 08/15/2016 0.9* 1.0 - 3.6 K/uL Final  . Monocytes Relative 08/15/2016 14  % Final  . Monocytes Absolute 08/15/2016 0.6  0.2 - 0.9 K/uL Final  . Eosinophils Relative 08/15/2016 2  % Final  . Eosinophils Absolute 08/15/2016 0.1  0 - 0.7 K/uL Final  . Basophils Relative 08/15/2016 0  % Final  . Basophils Absolute 08/15/2016 0.0  0 - 0.1 K/uL Final  . Sodium 08/15/2016 139  135 - 145 mmol/L Final  . Potassium 08/15/2016 3.9  3.5 - 5.1 mmol/L Final  . Chloride 08/15/2016 108  101 - 111 mmol/L Final  . CO2 08/15/2016 27  22 - 32 mmol/L Final  . Glucose, Bld 08/15/2016 115* 65 - 99 mg/dL Final  . BUN 08/15/2016 16  6 - 20 mg/dL Final  . Creatinine, Ser 08/15/2016 0.85  0.44 - 1.00 mg/dL Final  . Calcium 08/15/2016 9.3  8.9 - 10.3 mg/dL Final  . Total Protein 08/15/2016 6.5  6.5 - 8.1 g/dL Final  . Albumin 08/15/2016 4.1  3.5 - 5.0 g/dL Final  . AST 08/15/2016 33  15 - 41 U/L Final  . ALT 08/15/2016 18  14 - 54 U/L Final  . Alkaline Phosphatase 08/15/2016 132* 38 - 126 U/L Final  . Total Bilirubin 08/15/2016 0.7  0.3 - 1.2 mg/dL Final  . GFR calc non Af Amer 08/15/2016 >60  >60 mL/min Final  . GFR calc Af Amer 08/15/2016 >60  >60 mL/min Final   Comment: (NOTE) The eGFR has been calculated using the CKD EPI equation. This calculation has not been validated in all clinical situations. eGFR's persistently <60 mL/min signify possible Chronic Kidney Disease.   Georgiann Hahn gap 08/15/2016 4* 5 - 15 Final    Assessment:  Susan [name] is a 82 y.o. female with stage IIIC endometrial cancer s/p TLH/BSO and staging on 04/08/2015 at [facility].  Pathology  revealed a 5.1 cm mixed endometrial carcinoma composed of serous carcinoma and endometrioid adenocarcinoma.  Tumor invaded 2 mm into a 14  mm thick myometrium.  There was no cervical involvement.   There was lymphovascular invasion.  Washings were negative.  There were 3 positive lymph nodes in the pelvis (left external iliac node: 1 cm focus without extranodal extension; less than 0.1 mm focus in 1 external right iliac node and a 0.5 mm focus in another right external iliac node).  Pathologic stage was [id number].  Abdomen and pelvic CT scan on 03/30/2015 revealed fluid and/or soft tissue filling the endometrial cavity, potentially prolapsing through the cervical os into the upper vagina, compatible with the reported endometrial neoplasm.  There was no definite evidence of metastatic disease in the abdomen or pelvis.  There was multiple nonobstructive calculi in the left renal collecting system.  There was grade 1 spondylolisthesis of L4 upon L5 with extensive multilevel degenerative disc disease and facet arthropathy throughout the lumbar spine, including mild levoscoliosis.  She received 2 cycles of carboplatin and Taxol (05/12/2015 - 06/02/2015).  Chemotherapy was truncated secondary to a debilitating neuropathy.  She received 4500 cGy pelvic radiation and 1200 cGy using high dose rate remote afterloading through vaginal cylinder (completed 10/18/2015).  CA125 has been followed:  82.5 on 04/28/2015, 22.0 on 06/02/2015, 18.2 on 07/14/2015, 11.7 on 10/21/2015, 16.4 on 01/21/2016, 14.2 on 04/25/2016, and 13.6 on 08/15/2016.  She was started on Neurontin for neuropathy.  She has incontinence.  Symptomatically, she back and RLQ pain.  Exam is stable.  Plan: 1.  Labs today:  CBC with diff, CMP, CA125. 2.  Abdomen/pelvic CT next available. 3.  Port flush every 6-8 weeks. 4.  RTC after CT scan.   Lequita Asal, MD  08/15/2016, 2:36 PM

## 2016-08-15 NOTE — Progress Notes (Signed)
Patient would like to have her port removed.  She does have lower, right abdominal pain that interferes with her sleep.  When she stretches out straight it subsides.

## 2016-08-16 LAB — CA 125: CA 125: 13.6 U/mL (ref 0.0–38.1)

## 2016-08-23 ENCOUNTER — Inpatient Hospital Stay (HOSPITAL_BASED_OUTPATIENT_CLINIC_OR_DEPARTMENT_OTHER): Payer: Medicare Other | Admitting: Obstetrics and Gynecology

## 2016-08-23 VITALS — BP 110/67 | HR 66 | Temp 97.6°F | Ht 64.0 in | Wt 199.5 lb

## 2016-08-23 DIAGNOSIS — K219 Gastro-esophageal reflux disease without esophagitis: Secondary | ICD-10-CM | POA: Diagnosis not present

## 2016-08-23 DIAGNOSIS — G629 Polyneuropathy, unspecified: Secondary | ICD-10-CM | POA: Diagnosis not present

## 2016-08-23 DIAGNOSIS — Z9221 Personal history of antineoplastic chemotherapy: Secondary | ICD-10-CM | POA: Diagnosis not present

## 2016-08-23 DIAGNOSIS — Z923 Personal history of irradiation: Secondary | ICD-10-CM

## 2016-08-23 DIAGNOSIS — Z7982 Long term (current) use of aspirin: Secondary | ICD-10-CM

## 2016-08-23 DIAGNOSIS — R001 Bradycardia, unspecified: Secondary | ICD-10-CM

## 2016-08-23 DIAGNOSIS — Z79899 Other long term (current) drug therapy: Secondary | ICD-10-CM

## 2016-08-23 DIAGNOSIS — R1031 Right lower quadrant pain: Secondary | ICD-10-CM

## 2016-08-23 DIAGNOSIS — I10 Essential (primary) hypertension: Secondary | ICD-10-CM

## 2016-08-23 DIAGNOSIS — E785 Hyperlipidemia, unspecified: Secondary | ICD-10-CM

## 2016-08-23 DIAGNOSIS — R32 Unspecified urinary incontinence: Secondary | ICD-10-CM

## 2016-08-23 DIAGNOSIS — I255 Ischemic cardiomyopathy: Secondary | ICD-10-CM

## 2016-08-23 DIAGNOSIS — I472 Ventricular tachycardia: Secondary | ICD-10-CM

## 2016-08-23 DIAGNOSIS — I2510 Atherosclerotic heart disease of native coronary artery without angina pectoris: Secondary | ICD-10-CM

## 2016-08-23 DIAGNOSIS — Z8542 Personal history of malignant neoplasm of other parts of uterus: Secondary | ICD-10-CM | POA: Diagnosis not present

## 2016-08-23 DIAGNOSIS — Z90710 Acquired absence of both cervix and uterus: Secondary | ICD-10-CM | POA: Diagnosis not present

## 2016-08-23 DIAGNOSIS — M8580 Other specified disorders of bone density and structure, unspecified site: Secondary | ICD-10-CM

## 2016-08-23 DIAGNOSIS — Z90722 Acquired absence of ovaries, bilateral: Secondary | ICD-10-CM | POA: Diagnosis not present

## 2016-08-23 DIAGNOSIS — I252 Old myocardial infarction: Secondary | ICD-10-CM

## 2016-08-23 NOTE — Progress Notes (Signed)
  Oncology Nurse Navigator Documentation Chaperoned pelvic exam. Follow up in 6 months. Navigator Location: [facility] (08/23/16 1400)   )Navigator Encounter Type: Follow-up Appt (08/23/16 1400)                     Patient Visit Type: GynOnc (08/23/16 1400)                              Time Spent with Patient: 15 (08/23/16 1400)

## 2016-08-23 NOTE — Progress Notes (Signed)
Gynecologic Oncology Visit   Referring Provider: Dr Joylene Igo  Chief Concern: stage IIIC1serous endometrial cancer  Subjective:  Susan [name] is a 82 y.o. P28 female who is seen in consultation from Dr. Newman Nip for serous uterine cancer.   She presents today for continued surveillance. She saw Dr. Mike Gip on 08/15/2016 and had a exam that was negative. She presents today for a pelvic exam.   She has multiple complaints including RLQ pain and Dr. Mike Gip ordered a CT scan to evaluate further.  She has also seen Dr. Bjorn Loser, Urology, and started on oral therapy for urinary incontinence but has not had an improvement in her symptoms.  She is still teaching water aerobics.    Lab Results  Component Value Date   CA125 13.6 08/15/2016   CA125 14.2 04/25/2016   CA125 16.4 01/21/2016     Gynecologic Oncology  Susan [name] is a 82 y.o. P56 female with stage IIIC1 mixed endometrial carcinoma composed of serous carcinoma and endometrioid adenocarcinoma s/p TLH/BSO and staging 04/08/15 at [facility].  Please see complete details in prior notes.   Path report below shows stage IIIC1 disease.  Washings were negative, but she had 3 positive SLNs in pelvis.  An aortic SLN was negative.     A. Lymph node, sentinel, right external iliac, excision: Minute focus of metastatic carcinoma involving one lymph node (1/1). See note. Size of metastasis: Less than 0.1 mm. Negative for extranodal invasion.  Note: A cytokeratin AE1/3 highlights the metastatic focus.  B. Lymph node, sentinel, right external iliac #2, excision: Metastatic carcinoma involving one lymph node (1/1). See note. Size of metastasis: 0.5 mm. Negative for extranodal invasion. Note: A cytokeratin AE1/3 highlights the metastatic focus.  C. Lymph node, sentinel, right aortic, excision: One benign lymph node (0/1). Note: A cytokeratin AE1/3 immunostain is negative.  D. Lymph node, sentinel, left external iliac,  excision:  Metastatic adenocarcinoma involving one lymph node (1/1).   Size of metastasis: 1 cm. Negative for extranodal invasion. Note: A cytokeratin AE1/3 highlights the metastatic focus.  E. 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  Tumor size: 5.1 cm. The tumor invades 2 mm into a 14 mm thick myometrium. Negative for cervical involvement. Lymphovascular invasion is present.  04/28/2015 CA125 82.5   Recommendation to proceed with with carboplatinum and paclitaxel three-four cycles followed by radiation therapy followed by additional 2-3 cycles of chemotherapy. She started on chemotherapy on 05/12/2015 and received 2nd cycle on 06/02/2015. She developed significant neuropathy impairing her quality of life with complaints of sensory ataxia, frequent falls, and needs to walk with the help of walker. Chemotherapy was discontinued. Then received 4500 cGy pelvic radiation and 1200 cGy using high dose rate remote afterloading through vaginal cylinder completed 7/17.  Problem List: Patient Active Problem List   Diagnosis Date Noted  . History of endometrial cancer 08/23/2016  . Absence of bladder continence 07/21/2015  . Neuropathy associated with cancer ([place]) 06/02/2015  . Postoperative state 04/08/2015  . Carcinoma of endometrium ([place]) 04/02/2015  . Endometrial cancer ([place]) 03/24/2015  . Gastro-esophageal reflux disease without esophagitis 02/15/2015  . Osteopenia 08/17/2014  . Hyperlipidemia 02/13/2014  . Kidney stones 10/27/2013  . CA skin, basal cell 06/30/2013  . Narrowing of intervertebral disc space 06/30/2013  . Accumulation of fluid in tissues 06/30/2013  . Benign essential HTN 06/30/2013  . Calculus of kidney 06/30/2013  . Lumbar canal stenosis 06/30/2013  .  Arthritis, degenerative 06/30/2013  . Neuralgia neuritis, sciatic nerve  06/30/2013  . Edema 06/30/2013  . Heart murmur 06/30/2013  . Hematuria 12/19/2012  . CAD (coronary artery disease)- elective CFX DES 10/24/12 for recurrent angina 10/25/2012  . Dyslipidemia 10/25/2012  . NSVT (nonsustained ventricular tachycardia) ([place]) 10/25/2012  . Bradycardia- tolerating low dose beta blocker 10/25/2012  . Cardiomyopathy, ischemic: EF 35-40% by echo post-MI 10/20/12 10/22/2012  . STEMI 10/19/12- LAD DES, and CFX DES 10/24/12 10/20/2012  . HTN (hypertension) 10/20/2012    Past Medical History: Past Medical History:  Diagnosis Date  . Bradycardia   . Coronary artery disease   . Heart murmur   . Hyperlipidemia   . Hypertension   . Myocardial infarction ([place])   . NSVT (nonsustained ventricular tachycardia) ([place])   . Personal history of chemotherapy    ENDOMETRIAL CA  . Personal history of radiation therapy    ENDOMETRIAL CA  . Uterine cancer ([place])    serrous    Past Surgical History: Past Surgical History:  Procedure Laterality Date  . ABDOMINAL HYSTERECTOMY    . BREAST EXCISIONAL BIOPSY Right 1956   NEG  . LEFT HEART CATHETERIZATION WITH CORONARY ANGIOGRAM  10/19/2012   Procedure: LEFT HEART CATHETERIZATION WITH CORONARY ANGIOGRAM;  Surgeon: Troy Sine, MD;  Location: [facility] CATH LAB;  Service: Cardiovascular;;  . PERCUTANEOUS CORONARY STENT INTERVENTION (PCI-S)  10/19/2012   Procedure: PERCUTANEOUS CORONARY STENT INTERVENTION (PCI-S);  Surgeon: Troy Sine, MD;  Location[facility] CATH LAB;  Service: Cardiovascular;;  . PERCUTANEOUS CORONARY STENT INTERVENTION (PCI-S) N/A 10/23/2012   Procedure: PERCUTANEOUS CORONARY STENT INTERVENTION (PCI-S);  Surgeon: Troy Sine, MD;  Location: [facility] CATH LAB;  Service: Cardiovascular;  Laterality: N/A;  . PERIPHERAL VASCULAR CATHETERIZATION N/A 05/05/2015   Procedure: Glori Luis Cath Insertion;  Surgeon: Algernon Huxley, MD;  Location: [place] CV LAB;  Service: Cardiovascular;  Laterality: N/A;    Family History: Family History   Problem Relation Age of Onset  . Heart disease Mother   . Breast cancer Mother 48  . Heart disease Father   . Diabetes Father   . Diabetes Brother   . Stroke Brother     Social History: Social History   Social History  . Marital status: Married    Spouse name: N/A  . Number of children: N/A  . Years of education: N/A   Occupational History  . Not on file.   Social History Main Topics  . Smoking status: Never Smoker  . Smokeless tobacco: Never Used  . Alcohol use 0.6 oz/week    1 Glasses of wine per week  . Drug use: No  . Sexual activity: Yes   Other Topics Concern  . Not on file   Social History Narrative  . No narrative on file    Allergies: Allergies  Allergen Reactions  . Ace Inhibitors Cough  . Penicillins Rash    Current Medications: Current Outpatient Prescriptions  Medication Sig Dispense Refill  . aspirin EC 81 MG tablet Take 81 mg by mouth daily.    [name] atorvastatin (LIPITOR) 80 MG tablet Take 1 tablet (80 mg total) by mouth daily at 6 PM. 90 tablet 1  . BRILINTA 60 MG TABS tablet TAKE 1 TABLET BY MOUTH TWICE A DAY 60 tablet 5  . ezetimibe (ZETIA) 10 MG tablet Take 1 tablet (10 mg total) by mouth daily. 90 tablet 2  . furosemide (LASIX) 20 MG tablet TAKE 1 TABLET BY MOUTH DAILY. 90 tablet 0  .  gabapentin (NEURONTIN) 100 MG capsule Take by mouth. Pt takes 1 tablet in the A.M. And 2 tablets in the P.M.    . isosorbide mononitrate (IMDUR) 30 MG 24 hr tablet Take 1 tablet (30 mg total) by mouth daily. 90 tablet 2  . lidocaine-prilocaine (EMLA) cream Apply to affected area once 30 g 3  . loperamide (IMODIUM A-D) 2 MG tablet Take 2 mg by mouth 4 (four) times daily as needed for diarrhea or loose stools.    [name] losartan (COZAAR) 50 MG tablet TAKE 1 TABLET BY MOUTH EVERY DAY 90 tablet 0  . Multiple Vitamin (MULTIVITAMIN WITH MINERALS) TABS Take 1 tablet by mouth daily.    [name] NITROSTAT 0.4 MG SL tablet PLACE 1 TABLET UNDER THE TOUGUE EVERY 5 MINUTES FOR UP TO 3  DOSES AS NEEDED FOR CHEST PAIN 25 tablet 0  . omeprazole (PRILOSEC) 20 MG capsule Take 20 mg by mouth daily.    [name] pyridOXINE (VITAMIN B-6) 100 MG tablet Take 100 mg by mouth 2 (two) times daily.    . Vitamin D, Cholecalciferol, 1000 UNITS CAPS Take 1 capsule by mouth daily.     No current facility-administered medications for this visit.    Facility-Administered Medications Ordered in Other Visits  Medication Dose Route Frequency Provider Last Rate Last Dose  . sodium chloride flush (NS) 0.9 % injection 10 mL  10 mL Intravenous PRN Cammie Sickle, MD   10 mL at 06/06/16 1447  . sodium chloride flush (NS) 0.9 % injection 10 mL  10 mL Intravenous PRN Cammie Sickle, MD   10 mL at 08/15/16 1404    Review of Systems General: fatigue Pulmonary: negative for, dyspnea,  productive cough Gastrointestinal: abdominal pain RLQ and chronic diarrhea, but negative for n/v,constipation Genitourinary/Sexual: as per interval history Ob/Gyn: no vaginal discharge Musculoskeletal: back pain and leg swelling unchanged Hematology: No coagulation disorder, negative for, easy bruising, bleeding Neurologic/Psych: peripheral neuropathy  Objective:  Physical Examination:  BP 110/67   Pulse 66   Temp 97.6 F (36.4 C) (Tympanic)   Ht 5\' 4"  (1.626 m)   Wt 199 lb 8 oz (90.5 kg)   BMI 34.24 kg/m    ECOG Performance Status: 0 - Asymptomatic  General appearance: alert, cooperative and appears stated age Lymph node survey: non-palpable, inguinal nodes Abdomen: well healed incisions, nontender, protuberant but soft. No ascites or masses or hernias Extremities: extremities normal, atraumatic, no cyanosis. Edema symmetrical Neurological exam reveals alert, oriented, normal speech, she is walking with a cane  Pelvic: exam chaperoned by nurse;  Vulva: normal appearing vulva with no masses, tenderness or lesions; Vagina: normal vagina; no lesions; BME: no masses; Uterus/Cervix:surgically absent;  Rectal: deferred. Educated about Kegel exercises.    Assessment:  Susan [name] is a 82 y.o. 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 NED today on exam and recent CA125 normal. Complaint of RLQ pain concerning for recurrence, CT scan pending.  Body mass index is 34.24 kg/m.  Urinary incontinence.  Ischemic heart disease with cornonary stents on Brilinta, but good functional status.  Problem List Items Addressed This Visit      Other   History of endometrial cancer - Primary      She will continue follow up with Dr. Mike Gip at the [facility] cancer center.  Dr. Mike Gip has ordered a CT scan.  If scan negative but symptoms persistent may consider a PET scan.   She will follow up with Urology.   I have recommended continued close follow up with exams, including pelvic exams every 3-6 months for 2-3 years, then every 6-12 months for 3-5 years and then annually thereafter as long as she is NED.  Imaging and laboratory assessment is based on clinical indication. Patient education for obesity, lifestyle, exercise, nutrition, sexual health, vaginal lubricants. We previously discussed her weight and need for weight loss, stressed good nutrition, and exercise.  I provided information regarding calorie counting and exercise for weight loss. I offered referral to the CARE program and provided a pamphlet previously.   Info [employer] provided previously.    Gillis Ends, MD

## 2016-08-23 NOTE — Progress Notes (Signed)
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  She also has neuropathy from chemo of her fingers and feet. She has stress incontinence and she sees urologist for that. She has diarrhea off and on and some of it is diet related. No gyn issues at this time

## 2016-08-31 ENCOUNTER — Ambulatory Visit
Admission: RE | Admit: 2016-08-31 | Discharge: 2016-08-31 | Disposition: A | Discharge status: Home / Self Care | Payer: Medicare Other | Source: Ambulatory Visit | Attending: Hematology and Oncology | Admitting: Hematology and Oncology

## 2016-08-31 DIAGNOSIS — I252 Old myocardial infarction: Secondary | ICD-10-CM | POA: Insufficient documentation

## 2016-08-31 DIAGNOSIS — K449 Diaphragmatic hernia without obstruction or gangrene: Secondary | ICD-10-CM | POA: Diagnosis not present

## 2016-08-31 DIAGNOSIS — I2510 Atherosclerotic heart disease of native coronary artery without angina pectoris: Secondary | ICD-10-CM | POA: Diagnosis not present

## 2016-08-31 DIAGNOSIS — I700 Atherosclerosis of aorta: Secondary | ICD-10-CM | POA: Diagnosis not present

## 2016-08-31 DIAGNOSIS — C541 Malignant neoplasm of endometrium: Secondary | ICD-10-CM | POA: Insufficient documentation

## 2016-08-31 DIAGNOSIS — Z87442 Personal history of urinary calculi: Secondary | ICD-10-CM | POA: Insufficient documentation

## 2016-08-31 MED ORDER — IOPAMIDOL (ISOVUE-300) INJECTION 61%
100.0000 mL | Freq: Once | INTRAVENOUS | Status: AC | PRN
  Administered 2016-08-31: 100 mL via INTRAVENOUS

## 2016-09-04 ENCOUNTER — Ambulatory Visit: Payer: Medicare Other | Admitting: Urology

## 2016-09-04 ENCOUNTER — Inpatient Hospital Stay: Payer: Medicare Other | Attending: Hematology and Oncology | Admitting: Hematology and Oncology

## 2016-09-04 ENCOUNTER — Ambulatory Visit: Payer: Medicare Other

## 2016-09-04 ENCOUNTER — Encounter: Payer: Self-pay | Admitting: Hematology and Oncology

## 2016-09-04 ENCOUNTER — Encounter: Payer: Self-pay | Admitting: Urology

## 2016-09-04 VITALS — BP 140/74 | HR 53 | Temp 97.3°F | Resp 18 | Wt 197.4 lb

## 2016-09-04 VITALS — BP 107/68 | HR 74 | Ht 64.5 in | Wt 202.6 lb

## 2016-09-04 DIAGNOSIS — M4316 Spondylolisthesis, lumbar region: Secondary | ICD-10-CM | POA: Insufficient documentation

## 2016-09-04 DIAGNOSIS — Z87891 Personal history of nicotine dependence: Secondary | ICD-10-CM | POA: Diagnosis not present

## 2016-09-04 DIAGNOSIS — Z7982 Long term (current) use of aspirin: Secondary | ICD-10-CM | POA: Insufficient documentation

## 2016-09-04 DIAGNOSIS — I10 Essential (primary) hypertension: Secondary | ICD-10-CM | POA: Diagnosis not present

## 2016-09-04 DIAGNOSIS — M549 Dorsalgia, unspecified: Secondary | ICD-10-CM | POA: Diagnosis not present

## 2016-09-04 DIAGNOSIS — Z87442 Personal history of urinary calculi: Secondary | ICD-10-CM | POA: Insufficient documentation

## 2016-09-04 DIAGNOSIS — X58XXXS Exposure to other specified factors, sequela: Secondary | ICD-10-CM | POA: Diagnosis not present

## 2016-09-04 DIAGNOSIS — N3946 Mixed incontinence: Secondary | ICD-10-CM | POA: Diagnosis not present

## 2016-09-04 DIAGNOSIS — I252 Old myocardial infarction: Secondary | ICD-10-CM | POA: Diagnosis not present

## 2016-09-04 DIAGNOSIS — Z90710 Acquired absence of both cervix and uterus: Secondary | ICD-10-CM | POA: Diagnosis not present

## 2016-09-04 DIAGNOSIS — M5136 Other intervertebral disc degeneration, lumbar region: Secondary | ICD-10-CM | POA: Diagnosis not present

## 2016-09-04 DIAGNOSIS — E785 Hyperlipidemia, unspecified: Secondary | ICD-10-CM | POA: Insufficient documentation

## 2016-09-04 DIAGNOSIS — N390 Urinary tract infection, site not specified: Secondary | ICD-10-CM

## 2016-09-04 DIAGNOSIS — M8448XA Pathological fracture, other site, initial encounter for fracture: Secondary | ICD-10-CM | POA: Insufficient documentation

## 2016-09-04 DIAGNOSIS — Z79899 Other long term (current) drug therapy: Secondary | ICD-10-CM

## 2016-09-04 DIAGNOSIS — R911 Solitary pulmonary nodule: Secondary | ICD-10-CM

## 2016-09-04 DIAGNOSIS — Z8542 Personal history of malignant neoplasm of other parts of uterus: Secondary | ICD-10-CM

## 2016-09-04 DIAGNOSIS — Z90722 Acquired absence of ovaries, bilateral: Secondary | ICD-10-CM | POA: Insufficient documentation

## 2016-09-04 DIAGNOSIS — G629 Polyneuropathy, unspecified: Secondary | ICD-10-CM | POA: Diagnosis not present

## 2016-09-04 DIAGNOSIS — Y842 Radiological procedure and radiotherapy as the cause of abnormal reaction of the patient, or of later complication, without mention of misadventure at the time of the procedure: Secondary | ICD-10-CM | POA: Diagnosis not present

## 2016-09-04 DIAGNOSIS — C541 Malignant neoplasm of endometrium: Secondary | ICD-10-CM

## 2016-09-04 DIAGNOSIS — N200 Calculus of kidney: Secondary | ICD-10-CM | POA: Diagnosis not present

## 2016-09-04 DIAGNOSIS — N3941 Urge incontinence: Secondary | ICD-10-CM | POA: Insufficient documentation

## 2016-09-04 DIAGNOSIS — R1031 Right lower quadrant pain: Secondary | ICD-10-CM

## 2016-09-04 DIAGNOSIS — Z923 Personal history of irradiation: Secondary | ICD-10-CM | POA: Diagnosis not present

## 2016-09-04 DIAGNOSIS — I700 Atherosclerosis of aorta: Secondary | ICD-10-CM

## 2016-09-04 DIAGNOSIS — I2510 Atherosclerotic heart disease of native coronary artery without angina pectoris: Secondary | ICD-10-CM

## 2016-09-04 DIAGNOSIS — Z880 Allergy status to penicillin: Secondary | ICD-10-CM | POA: Diagnosis not present

## 2016-09-04 LAB — URINALYSIS, COMPLETE
BILIRUBIN UA: NEGATIVE
Glucose, UA: NEGATIVE
KETONES UA: NEGATIVE
NITRITE UA: NEGATIVE
Protein, UA: NEGATIVE
SPEC GRAV UA: 1.02 (ref 1.005–1.030)
UUROB: 0.2 mg/dL (ref 0.2–1.0)
pH, UA: 5 (ref 5.0–7.5)

## 2016-09-04 LAB — MICROSCOPIC EXAMINATION: RBC MICROSCOPIC, UA: NONE SEEN /HPF (ref 0–?)

## 2016-09-04 NOTE — Progress Notes (Signed)
Patient here today for CT results. 

## 2016-09-04 NOTE — Progress Notes (Signed)
[facility] day:  09/04/16  Chief Complaint: Susan [name] is a 82 y.o. female with stage IIIC uterine cancer who is seen for review of interval abdomen and pelvic CT scan and discussion regarding direction of therapy.  HPI:  The patient was last seen in the medical oncology clinic on 08/15/2016.  At that time, she has RLQ pain of unclear etiology.  CT scans were ordered.  Abdomen and pelvic CT scan on 08/31/2016 revealed no evidence of recurrent malignancy in the abdomen or pelvis.  There was a solid right middle lobe 3 mm pulmonary nodule, for which no prior comparison exists (this portion of the lungs was not included on the prior CT abdomen/pelvis study). Recommend follow-up dedicated chest CT in 3-6 months. There was new patchy sclerosis throughout the sacrum, probably due to post treatment change. There was new irregular curvilinear nondisplaced sacral insufficiency fractures in the bilateral sacral ala.  There was additional findings include aortic atherosclerosis, coronary atherosclerosis, sequela of remote apical left ventricular myocardial infarction, nonobstructing left nephrolithiasis and small hiatal hernia.  She saw Dr. Theora Gianotti on 08/23/2016.  Pelvic exam was unremarkable.  Recommendation was for PET scan if RLQ pain persisted with negative scan.  She was to follow-up with urology for urinary incontinence.  She saw Dr. Bjorn Loser at [place] on 06/201/2018.  She was noted to have urge incontinence.  Symptoms are triggered by sitting to standing.  She failed beta 3 agonist.  She had diarrhea with Toviaz.  She failed VESIcare.  She is scheduled for urodynamics testing on 09/08/2016 at [facility].  Symptomatically, she notes ongoing pain.  Pain is worse when she sits.  She describes having to stretch out her leg to ease the discomfort.  She feels better in a recliner with her feet up.   Past Medical History:   Diagnosis Date  . Bradycardia   . Coronary artery disease   . Heart murmur   . Hyperlipidemia   . Hypertension   . Myocardial infarction ([place])   . NSVT (nonsustained ventricular tachycardia) ([place])   . Personal history of chemotherapy    ENDOMETRIAL CA  . Personal history of radiation therapy    ENDOMETRIAL CA  . Uterine cancer ([place])    serrous    Past Surgical History:  Procedure Laterality Date  . ABDOMINAL HYSTERECTOMY    . BREAST EXCISIONAL BIOPSY Right 1956   NEG  . LEFT HEART CATHETERIZATION WITH CORONARY ANGIOGRAM  10/19/2012   Procedure: LEFT HEART CATHETERIZATION WITH CORONARY ANGIOGRAM;  Surgeon: Troy Sine, MD;  Location: [facility] CATH LAB;  Service: Cardiovascular;;  . PERCUTANEOUS CORONARY STENT INTERVENTION (PCI-S)  10/19/2012   Procedure: PERCUTANEOUS CORONARY STENT INTERVENTION (PCI-S);  Surgeon: Troy Sine, MD;  Location: [facility] CATH LAB;  Service: Cardiovascular;;  . PERCUTANEOUS CORONARY STENT INTERVENTION (PCI-S) N/A 10/23/2012   Procedure: PERCUTANEOUS CORONARY STENT INTERVENTION (PCI-S);  Surgeon: Troy Sine, MD;  Location: [facility] CATH LAB;  Service: Cardiovascular;  Laterality: N/A;  . PERIPHERAL VASCULAR CATHETERIZATION N/A 05/05/2015   Procedure: Glori Luis Cath Insertion;  Surgeon: Algernon Huxley, MD;  Location: [facility] CV LAB;  Service: Cardiovascular;  Laterality: N/A;    Family History  Problem Relation Age of Onset  . Heart disease Mother   . Breast cancer Mother 31  . Heart disease Father   . Diabetes Father   . Diabetes Brother   . Stroke Brother   . Kidney cancer Neg Hx   .  Bladder Cancer Neg Hx     Social History:  reports that she quit smoking about 42 years ago. She has never used smokeless tobacco. She reports that she drinks about 0.6 oz of alcohol per week . She reports that she does not use drugs.  She is originally from [place].  She lives in [place].  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  She and her  husband will be married 96 years in 11/2016.  [name], [name], today.  Allergies:  Allergies  Allergen Reactions  . Ace Inhibitors Cough  . Penicillins Rash    Current Medications: Current Outpatient Prescriptions  Medication Sig Dispense Refill  . aspirin EC 81 MG tablet Take 81 mg by mouth daily.    [name] atorvastatin (LIPITOR) 80 MG tablet Take 1 tablet (80 mg total) by mouth daily at 6 PM. 90 tablet 1  . BRILINTA 60 MG TABS tablet TAKE 1 TABLET BY MOUTH TWICE A DAY 60 tablet 5  . ezetimibe (ZETIA) 10 MG tablet Take 1 tablet (10 mg total) by mouth daily. 90 tablet 2  . fluticasone (FLONASE) 50 MCG/ACT nasal spray Place into the nose.    . furosemide (LASIX) 20 MG tablet TAKE 1 TABLET BY MOUTH DAILY. 90 tablet 0  . gabapentin (NEURONTIN) 100 MG capsule Take by mouth. Pt takes 1 tablet in the A.M. And 2 tablets in the P.M.    . isosorbide mononitrate (IMDUR) 30 MG 24 hr tablet Take 1 tablet (30 mg total) by mouth daily. 90 tablet 2  . lidocaine-prilocaine (EMLA) cream Apply to affected area once 30 g 3  . loperamide (IMODIUM A-D) 2 MG tablet Take 2 mg by mouth 4 (four) times daily as needed for diarrhea or loose stools.    [name] losartan (COZAAR) 50 MG tablet TAKE 1 TABLET BY MOUTH EVERY DAY 90 tablet 0  . Multiple Vitamin (MULTIVITAMIN WITH MINERALS) TABS Take 1 tablet by mouth daily.    [name] NITROSTAT 0.4 MG SL tablet PLACE 1 TABLET UNDER THE TOUGUE EVERY 5 MINUTES FOR UP TO 3 DOSES AS NEEDED FOR CHEST PAIN 25 tablet 0  . omeprazole (PRILOSEC) 20 MG capsule Take 20 mg by mouth daily.    [name] pyridOXINE (VITAMIN B-6) 100 MG tablet Take 100 mg by mouth 2 (two) times daily.    . Vitamin D, Cholecalciferol, 1000 UNITS CAPS Take 1 capsule by mouth daily.     No current facility-administered medications for this visit.    Facility-Administered Medications Ordered in Other Visits  Medication Dose Route Frequency Provider Last Rate Last Dose  . sodium chloride flush (NS)  0.9 % injection 10 mL  10 mL Intravenous PRN Cammie Sickle, MD   10 mL at 06/06/16 1447  . sodium chloride flush (NS) 0.9 % injection 10 mL  10 mL Intravenous PRN Cammie Sickle, MD   10 mL at 08/15/16 1404    Review of Systems:  GENERAL:  Feels "ok".  No fevers or sweats.  Weight stable. PERFORMANCE STATUS (ECOG):  2 HEENT:  No visual changes, runny nose, sore throat, mouth sores or tenderness. Lungs: No shortness of breath or cough.  No hemoptysis.  Sleep apnea.   Cardiac:  No chest pain, palpitations, orthopnea, or PND.  MI in 2014. GI:  Frequent stools depending diet.  Using Probiotics.  RLQ pain (see HPI).  No nausea, vomiting, constipation, melena or hematochezia.  GU:  Stress  incontinence (see HPI).  No urgency, frequency, dysuria, or hematuria. Musculoskeletal:  Back issues.  No joint pain.  No muscle tenderness. Extremities:  No pain or swelling. Skin:  No rashes or skin changes. Neuro:  Neuropathy (feet and fingers).  No headache, focal weakness, balance or coordination issues. Endocrine:  No diabetes, thyroid issues, hot flashes or night sweats. Psych:  No mood changes, depression or anxiety. Pain:  Back pain. Review of systems:  All other systems reviewed and found to be negative.  Physical Exam: 197 Blood pressure 140/74, pulse (!) 53, temperature 97.3 F (36.3 C), temperature source Tympanic, resp. rate 18, weight 197 lb 7 oz (89.6 kg). GENERAL:  Well developed, well nourished, woman sitting comfortably in the exam room in no acute distress.  MENTAL STATUS:  Alert and oriented to person, place and time. HEAD:  Short gray hair.  Normocephalic, atraumatic, face symmetric, no Cushingoid features. EYES:  Glasses.  Blue eyes.  No conjunctivitis or scleral icterus. NEUROLOGICAL: Appropriate. PSYCH:  Appropriate.   No visits with results within 3 Day(s) from this visit.  Latest known visit with results is:  Infusion on 08/15/2016  Component Date Value Ref  Range Status  . WBC 08/15/2016 4.4  3.6 - 11.0 K/uL Final  . RBC 08/15/2016 3.92  3.80 - 5.20 MIL/uL Final  . Hemoglobin 08/15/2016 12.1  12.0 - 16.0 g/dL Final  . HCT 08/15/2016 35.0  35.0 - 47.0 % Final  . MCV 08/15/2016 89.1  80.0 - 100.0 fL Final  . MCH 08/15/2016 30.8  26.0 - 34.0 pg Final  . MCHC 08/15/2016 34.5  32.0 - 36.0 g/dL Final  . RDW 08/15/2016 14.5  11.5 - 14.5 % Final  . Platelets 08/15/2016 209  150 - 440 K/uL Final  . Neutrophils Relative % 08/15/2016 63  % Final  . Neutro Abs 08/15/2016 2.8  1.4 - 6.5 K/uL Final  . Lymphocytes Relative 08/15/2016 21  % Final  . Lymphs Abs 08/15/2016 0.9* 1.0 - 3.6 K/uL Final  . Monocytes Relative 08/15/2016 14  % Final  . Monocytes Absolute 08/15/2016 0.6  0.2 - 0.9 K/uL Final  . Eosinophils Relative 08/15/2016 2  % Final  . Eosinophils Absolute 08/15/2016 0.1  0 - 0.7 K/uL Final  . Basophils Relative 08/15/2016 0  % Final  . Basophils Absolute 08/15/2016 0.0  0 - 0.1 K/uL Final  . Sodium 08/15/2016 139  135 - 145 mmol/L Final  . Potassium 08/15/2016 3.9  3.5 - 5.1 mmol/L Final  . Chloride 08/15/2016 108  101 - 111 mmol/L Final  . CO2 08/15/2016 27  22 - 32 mmol/L Final  . Glucose, Bld 08/15/2016 115* 65 - 99 mg/dL Final  . BUN 08/15/2016 16  6 - 20 mg/dL Final  . Creatinine, Ser 08/15/2016 0.85  0.44 - 1.00 mg/dL Final  . Calcium 08/15/2016 9.3  8.9 - 10.3 mg/dL Final  . Total Protein 08/15/2016 6.5  6.5 - 8.1 g/dL Final  . Albumin 08/15/2016 4.1  3.5 - 5.0 g/dL Final  . AST 08/15/2016 33  15 - 41 U/L Final  . ALT 08/15/2016 18  14 - 54 U/L Final  . Alkaline Phosphatase 08/15/2016 132* 38 - 126 U/L Final  . Total Bilirubin 08/15/2016 0.7  0.3 - 1.2 mg/dL Final  . GFR calc non Af Amer 08/15/2016 >60  >60 mL/min Final  . GFR calc Af Amer 08/15/2016 >60  >60 mL/min Final   Comment: (NOTE) The eGFR has been calculated using  the CKD EPI equation. This calculation has not been validated in all clinical situations. eGFR's  persistently <60 mL/min signify possible Chronic Kidney Disease.   . Anion gap 08/15/2016 4* 5 - 15 Final  . CA 125 08/15/2016 13.6  0.0 - 38.1 U/mL Final   Comment: (NOTE) Roche ECLIA methodology Performed At: [facility] [address] [place], [place] [id number] Lindon Romp MD LO:[id number]     Assessment:  Susan [name] is a 82 y.o. female with stage IIIC endometrial cancer s/p TLH/BSO and staging on 04/08/2015 at [facility].  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  Tumor invaded 2 mm into a 14 mm thick myometrium.  There was no cervical involvement.   There was lymphovascular invasion.  Washings were negative.  There were 3 positive lymph nodes in the pelvis (left external iliac node: 1 cm focus without extranodal extension; less than 0.1 mm focus in 1 external right iliac node and a 0.5 mm focus in another right external iliac node).  Pathologic stage was T1aN1M0.  Abdomen and pelvic CT on 03/30/2015 revealed fluid and/or soft tissue filling the endometrial cavity, potentially prolapsing through the cervical os into the upper vagina, compatible with the reported endometrial neoplasm.  There was no definite evidence of metastatic disease in the abdomen or pelvis.  There was multiple nonobstructive calculi in the left renal collecting system.  There was grade 1 spondylolisthesis of L4 upon L5 with extensive multilevel degenerative disc disease and facet arthropathy throughout the lumbar spine, including mild levoscoliosis.  She received 2 cycles of carboplatin and Taxol (05/12/2015 - 06/02/2015).  Chemotherapy was truncated secondary to a debilitating neuropathy.  She received 4500 cGy pelvic radiation and 1200 cGy using high dose rate remote afterloading through vaginal cylinder (completed 10/18/2015).  Abdomen and pelvic CT on 08/31/2016 revealed no evidence of recurrent malignancy in the abdomen or  pelvis.  There was a solid right middle lobe 3 mm pulmonary nodule, for which no prior comparison exists (this portion of the lungs was not included on the prior CT abdomen/pelvis study). There was new patchy sclerosis throughout the sacrum, probably due to post treatment change. There was new irregular curvilinear nondisplaced sacral insufficiency fractures in the bilateral sacral ala.  There was additional findings include aortic atherosclerosis, coronary atherosclerosis, sequela of remote apical left ventricular myocardial infarction, nonobstructing left nephrolithiasis, and small hiatal hernia.  CA125 has been followed:  82.5 on 04/28/2015, 22.0 on 06/02/2015, 18.2 on 07/14/2015, 11.7 on 10/21/2015, 16.4 on 01/21/2016, 14.2 on 04/25/2016, and 13.6 on 08/15/2016.  She was started on Neurontin for neuropathy.  Higher doses made her "fuzzy headed".  She has urge incontinence.  She is scheduled for urodynamics testing on 09/08/2016.  Symptomatically, she has RLQ abdominal pain.  Pain is worse when she sits and is relieved by stretching out her leg.  Exam is stable.  Plan: 1.  Review results of abdominal and pelvic CT scan.  No evidence of recurrent disease.  Discuss patchy sclerosis in sacrum secondary to radiation.  Discuss new sacral insufficiency fracture.  Unclear if this is the cause of her pain (? referred) given her discomfort and adjustment in position to relieve sacral pressure.  Discuss referral to orthopedics or neurosurgery for opinion and management.  If consultant does not feel that this is the etiology of her pain, discuss f/u PET scan.  Discuss 3 mm pulmonary nodule noted on scan.  Discuss plan for repeat chest  CT without contrast in 3 months. 2.  Referral to neurosurgery: evaluation and management of sacral insufficiency fracture. 3.  Port flush every 6-8 weeks. 4.  Chest CT without contrast in 3 months 5.  RTC for MD assessment, labs (CBC with diff, CMP, CA125), and review of chest  CT.   Lequita Asal, MD  09/04/2016, 2:31 PM

## 2016-09-04 NOTE — Progress Notes (Signed)
09/04/2016 9:56 AM   Susan [name] Jan 17, 1935 [id number]  Referring provider: [name], MD [phone] [place], [place] [address]  Chief Complaint  Patient presents with  . Follow-up    2 month Urinary incontinence / Urinary frequency    HPI: I was consulted to assess the patient urinary incontinence which I believe worsened after her radiation last summer. She may have had a little bit of stress incontinence prior. Now she can leak with coughing and sneezing and bending and lifting if her bladder is full. Her primary symptom is urge incontinence triggered by sitting to standing position and especially worse in the morning with foot on the floor syndrome. She does not have bedwetting. She now wears 3-4 pads a day moderately wet but soaked in the morning  She voids every 1-2 hours worse after taking Lasix. She has no nocturia. She had a hysterectomy followed by chemotherapy followed by radiation for endometrial cancer 18 months ago  Well supported bladder neck with a mild positive cough test  The patient has mixed incontinence were primarily urgency incontinence with typical triggers. It was likely worsen post radiation and hysterectomy.   Because I did not think it would change the management I did not perform cystoscopy yet and I did not recommend urodynamics. I will call if the urine culture is positive. The patient will be started on the beta 3 agonists with samples and prescription at 50 mg and reassess in one month. If the patient's stress incontinence was ever treated a urethral injectable would be the treatment of choice  Today The patient failed the beta 3 agonists. Frequency is stable She had diarrhea with Toviaz. Vesicare fail. She still has urge incontinence and leaks a lot getting out of a chair. She also has mild stress incontinence.    PMH: Past Medical History:  Diagnosis Date  . Bradycardia   . Coronary artery disease   . Heart murmur   .  Hyperlipidemia   . Hypertension   . Myocardial infarction ([place])   . NSVT (nonsustained ventricular tachycardia) ([place])   . Personal history of chemotherapy    ENDOMETRIAL CA  . Personal history of radiation therapy    ENDOMETRIAL CA  . Uterine cancer ([place])    serrous    Surgical History: Past Surgical History:  Procedure Laterality Date  . ABDOMINAL HYSTERECTOMY    . BREAST EXCISIONAL BIOPSY Right 1956   NEG  . LEFT HEART CATHETERIZATION WITH CORONARY ANGIOGRAM  10/19/2012   Procedure: LEFT HEART CATHETERIZATION WITH CORONARY ANGIOGRAM;  Surgeon: Troy Sine, MD;  Location: [facility] CATH LAB;  Service: Cardiovascular;;  . PERCUTANEOUS CORONARY STENT INTERVENTION (PCI-S)  10/19/2012   Procedure: PERCUTANEOUS CORONARY STENT INTERVENTION (PCI-S);  Surgeon: Troy Sine, MD;  Location: [facility] CATH LAB;  Service: Cardiovascular;;  . PERCUTANEOUS CORONARY STENT INTERVENTION (PCI-S) N/A 10/23/2012   Procedure: PERCUTANEOUS CORONARY STENT INTERVENTION (PCI-S);  Surgeon: Troy Sine, MD;  Location: [facility] CATH LAB;  Service: Cardiovascular;  Laterality: N/A;  . PERIPHERAL VASCULAR CATHETERIZATION N/A 05/05/2015   Procedure: Glori Luis Cath Insertion;  Surgeon: Algernon Huxley, MD;  Location: [place] CV LAB;  Service: Cardiovascular;  Laterality: N/A;    Home Medications:  Allergies as of 09/04/2016      Reactions   Ace Inhibitors Cough   Penicillins Rash      Medication List       Accurate as of 09/04/16  9:56 AM. Always use your most recent med list.  aspirin EC 81 MG tablet Take 81 mg by mouth daily.   atorvastatin 80 MG tablet Commonly known as:  LIPITOR Take 1 tablet (80 mg total) by mouth daily at 6 PM.   BRILINTA 60 MG Tabs tablet Generic drug:  ticagrelor TAKE 1 TABLET BY MOUTH TWICE A DAY   ezetimibe 10 MG tablet Commonly known as:  ZETIA Take 1 tablet (10 mg total) by mouth daily.   fluticasone 50 MCG/ACT nasal spray Commonly known as:  FLONASE Place into the  nose.   furosemide 20 MG tablet Commonly known as:  LASIX TAKE 1 TABLET BY MOUTH DAILY.   gabapentin 100 MG capsule Commonly known as:  NEURONTIN Take by mouth. Pt takes 1 tablet in the A.M. And 2 tablets in the P.M.   isosorbide mononitrate 30 MG 24 hr tablet Commonly known as:  IMDUR Take 1 tablet (30 mg total) by mouth daily.   lidocaine-prilocaine cream Commonly known as:  EMLA Apply to affected area once   loperamide 2 MG tablet Commonly known as:  IMODIUM A-D Take 2 mg by mouth 4 (four) times daily as needed for diarrhea or loose stools.   losartan 50 MG tablet Commonly known as:  COZAAR TAKE 1 TABLET BY MOUTH EVERY DAY   multivitamin with minerals Tabs tablet Take 1 tablet by mouth daily.   NITROSTAT 0.4 MG SL tablet Generic drug:  nitroGLYCERIN PLACE 1 TABLET UNDER THE TOUGUE EVERY 5 MINUTES FOR UP TO 3 DOSES AS NEEDED FOR CHEST PAIN   omeprazole 20 MG capsule Commonly known as:  PRILOSEC Take 20 mg by mouth daily.   pyridOXINE 100 MG tablet Commonly known as:  VITAMIN B-6 Take 100 mg by mouth 2 (two) times daily.   Vitamin D (Cholecalciferol) 1000 units Caps Take 1 capsule by mouth daily.       Allergies:  Allergies  Allergen Reactions  . Ace Inhibitors Cough  . Penicillins Rash    Family History: Family History  Problem Relation Age of Onset  . Heart disease Mother   . Breast cancer Mother 37  . Heart disease Father   . Diabetes Father   . Diabetes Brother   . Stroke Brother   . Kidney cancer Neg Hx   . Bladder Cancer Neg Hx     Social History:  reports that she quit smoking about 42 years ago. She has never used smokeless tobacco. She reports that she drinks about 0.6 oz of alcohol per week . She reports that she does not use drugs.  ROS: UROLOGY Frequent Urination?: No Hard to postpone urination?: No Burning/pain with urination?: No Get up at night to urinate?: No Leakage of urine?: Yes Urine stream starts and stops?: No Trouble  starting stream?: No Do you have to strain to urinate?: No Blood in urine?: No Urinary tract infection?: No Sexually transmitted disease?: No Injury to kidneys or bladder?: No Painful intercourse?: No Weak stream?: No Currently pregnant?: No Vaginal bleeding?: No Last menstrual period?: n  Gastrointestinal Nausea?: No Vomiting?: No Indigestion/heartburn?: No Diarrhea?: No Constipation?: No  Constitutional Fever: No Night sweats?: No Weight loss?: No Fatigue?: No  Skin Skin rash/lesions?: No Itching?: No  Eyes Blurred vision?: No Double vision?: No  Ears/Nose/Throat Sore throat?: No Sinus problems?: No  Hematologic/Lymphatic Swollen glands?: No Easy bruising?: No  Cardiovascular Leg swelling?: No Chest pain?: No  Respiratory Cough?: No Shortness of breath?: No  Endocrine Excessive thirst?: No  Musculoskeletal Back pain?: Yes Joint pain?: No  Neurological  Headaches?: No Dizziness?: No  Psychologic Depression?: No Anxiety?: No  Physical Exam: BP 107/68   Pulse 74   Ht 5' 4.5" (1.638 m)   Wt 202 lb 9.6 oz (91.9 kg)   BMI 34.24 kg/m   Constitutional:  Alert and oriented, No acute distress.   Laboratory Data: Lab Results  Component Value Date   WBC 4.4 08/15/2016   HGB 12.1 08/15/2016   HCT 35.0 08/15/2016   MCV 89.1 08/15/2016   PLT 209 08/15/2016    Lab Results  Component Value Date   CREATININE 0.85 08/15/2016    No results found for: PSA  No results found for: TESTOSTERONE  Lab Results  Component Value Date   HGBA1C 5.7 (H) 10/19/2012    Urinalysis    Component Value Date/Time   APPEARANCEUR Cloudy (A) 05/29/2016 1537   GLUCOSEU Negative 05/29/2016 1537   BILIRUBINUR Negative 05/29/2016 1537   PROTEINUR Negative 05/29/2016 1537   NITRITE Negative 05/29/2016 1537   LEUKOCYTESUR 1+ (A) 05/29/2016 1537    Pertinent Imaging: none  Assessment & Plan:  The patient will have urodynamics in [place]. On the next  visit when I see her I would like to perform cystoscopy. She will be assessed for overactive bladder therapies but also the possibility of urethral injectable helping her. She's been having some nonspecific right lower quadrant discomfort and just had a CT scan and will be followed by her gynecology oncology team I believe tomorrow  1. Recurrent UTI  - Urinalysis, Complete   No Follow-up on file.  Reece Packer, MD  [facility] [address], [place], [place] [address] [phone]

## 2016-09-05 DIAGNOSIS — M8448XA Pathological fracture, other site, initial encounter for fracture: Secondary | ICD-10-CM | POA: Insufficient documentation

## 2016-09-15 ENCOUNTER — Other Ambulatory Visit: Payer: Self-pay | Admitting: Urology

## 2016-09-26 ENCOUNTER — Inpatient Hospital Stay: Payer: Medicare Other | Attending: Hematology and Oncology

## 2016-09-26 DIAGNOSIS — Z452 Encounter for adjustment and management of vascular access device: Secondary | ICD-10-CM | POA: Diagnosis not present

## 2016-09-26 DIAGNOSIS — Z90722 Acquired absence of ovaries, bilateral: Secondary | ICD-10-CM | POA: Diagnosis not present

## 2016-09-26 DIAGNOSIS — Z8542 Personal history of malignant neoplasm of other parts of uterus: Secondary | ICD-10-CM | POA: Diagnosis not present

## 2016-09-26 DIAGNOSIS — Z90710 Acquired absence of both cervix and uterus: Secondary | ICD-10-CM | POA: Insufficient documentation

## 2016-09-26 DIAGNOSIS — Z9221 Personal history of antineoplastic chemotherapy: Secondary | ICD-10-CM | POA: Diagnosis not present

## 2016-09-26 DIAGNOSIS — Z923 Personal history of irradiation: Secondary | ICD-10-CM | POA: Diagnosis not present

## 2016-09-26 DIAGNOSIS — Z95828 Presence of other vascular implants and grafts: Secondary | ICD-10-CM

## 2016-09-26 MED ORDER — SODIUM CHLORIDE 0.9% FLUSH
10.0000 mL | INTRAVENOUS | Status: DC | PRN
  Administered 2016-09-26: 10 mL via INTRAVENOUS
  Filled 2016-09-26: qty 10

## 2016-09-26 MED ORDER — HEPARIN SOD (PORK) LOCK FLUSH 100 UNIT/ML IV SOLN
500.0000 [IU] | Freq: Once | INTRAVENOUS | Status: AC
  Administered 2016-09-26: 500 [IU] via INTRAVENOUS

## 2016-10-02 ENCOUNTER — Other Ambulatory Visit: Payer: Self-pay | Admitting: Cardiovascular Disease

## 2016-10-11 ENCOUNTER — Other Ambulatory Visit: Payer: Medicare Other

## 2016-10-16 ENCOUNTER — Encounter: Payer: Self-pay | Admitting: Urology

## 2016-10-16 ENCOUNTER — Ambulatory Visit (INDEPENDENT_AMBULATORY_CARE_PROVIDER_SITE_OTHER): Payer: Medicare Other | Admitting: Urology

## 2016-10-16 VITALS — BP 115/65 | HR 43 | Ht 64.5 in | Wt 202.7 lb

## 2016-10-16 DIAGNOSIS — N3946 Mixed incontinence: Secondary | ICD-10-CM | POA: Diagnosis not present

## 2016-10-16 DIAGNOSIS — N390 Urinary tract infection, site not specified: Secondary | ICD-10-CM | POA: Diagnosis not present

## 2016-10-16 LAB — URINALYSIS, COMPLETE
Bilirubin, UA: NEGATIVE
Glucose, UA: NEGATIVE
Ketones, UA: NEGATIVE
Nitrite, UA: NEGATIVE
PH UA: 5.5 (ref 5.0–7.5)
Protein, UA: NEGATIVE
Specific Gravity, UA: 1.025 (ref 1.005–1.030)
Urobilinogen, Ur: 0.2 mg/dL (ref 0.2–1.0)

## 2016-10-16 LAB — MICROSCOPIC EXAMINATION
BACTERIA UA: NONE SEEN
RBC, UA: NONE SEEN /hpf (ref 0–?)

## 2016-10-16 MED ORDER — LIDOCAINE HCL 2 % EX GEL
1.0000 "application " | Freq: Once | CUTANEOUS | Status: AC
  Administered 2016-10-16: 1 via URETHRAL

## 2016-10-16 MED ORDER — CIPROFLOXACIN HCL 500 MG PO TABS
500.0000 mg | ORAL_TABLET | Freq: Once | ORAL | Status: AC
  Administered 2016-10-16: 500 mg via ORAL

## 2016-10-16 NOTE — Progress Notes (Signed)
10/16/2016 11:09 AM   Susan [name] 1934-10-27 [id number]  Referring provider: [name], MD [phone] [place], [place] [address]  Chief Complaint  Patient presents with  . Cysto    HPI: I was consulted to assess the patient urinary incontinence which I believe worsened after her radiation last summer. She may have had a little bit of stress incontinence prior. Now she can leak with coughing and sneezing and bending and lifting if her bladder is full. Her primary symptom is urge incontinence triggered by sitting to standing position and especially worse in the morning with foot on the floor syndrome. She now wears 3-4 pads a day moderately wet but soaked in the morning  She voids every 1-2 hours worse after taking Lasix. She has no nocturia. She had a hysterectomy followed by chemotherapy followed by radiation for endometrial cancer 18 months ago  Well supported bladder neck with a mild positive cough test  The patient has mixed incontinence were primarily urgency incontinence with typical triggers. It was likely worsen post radiation and hysterectomy.   Because I did not think it would change the management I did not perform cystoscopy yet and I did not recommend urodynamics. I will call if the urine culture is positive. If the patient's stress incontinence was ever treated a urethral injectable would be the treatment of choice  The patient failed the beta 3 agonists. Frequency is stable She had diarrhea with Toviaz. Vesicare fail. She still has urge incontinence and leaks a lot getting out of a chair. She also has mild stress incontinence.  Today Frequency and incontinence are stable On urodynamics her bladder capacity was 260 mL. She had bladder overactivity reaching a pressure of 8 cm of water associated with severe leakage. At 200 mL her cough leak point pressure was 24 cm water with moderate leakage. During voluntary voiding she voided 257 mL with a maximum  flow of 8 mL/s. Maximum voiding pressure was 2 cm of water and she emptied efficiently. EMG activity increased during the voiding phase and bladder neck descended 1-2 cm.  Cystoscopy: Bladder mucosa and trigone were normal. There is no cystitis. There was no carcinoma. There is no fistula. I did not see obvious radiation changes.  She truly believes the radiation year ago cause things to worsen. Now she is having lower spine issues and perhaps fractures from the radiation.       PMH: Past Medical History:  Diagnosis Date  . Bradycardia   . Coronary artery disease   . Heart murmur   . Hyperlipidemia   . Hypertension   . Myocardial infarction ([place])   . NSVT (nonsustained ventricular tachycardia) ([place])   . Personal history of chemotherapy    ENDOMETRIAL CA  . Personal history of radiation therapy    ENDOMETRIAL CA  . Uterine cancer ([place])    serrous    Surgical History: Past Surgical History:  Procedure Laterality Date  . ABDOMINAL HYSTERECTOMY    . BREAST EXCISIONAL BIOPSY Right 1956   NEG  . LEFT HEART CATHETERIZATION WITH CORONARY ANGIOGRAM  10/19/2012   Procedure: LEFT HEART CATHETERIZATION WITH CORONARY ANGIOGRAM;  Surgeon: Troy Sine, MD;  Location: [facility] CATH LAB;  Service: Cardiovascular;;  . PERCUTANEOUS CORONARY STENT INTERVENTION (PCI-S)  10/19/2012   Procedure: PERCUTANEOUS CORONARY STENT INTERVENTION (PCI-S);  Surgeon: Troy Sine, MD;  Location: [facility] CATH LAB;  Service: Cardiovascular;;  . PERCUTANEOUS CORONARY STENT INTERVENTION (PCI-S) N/A 10/23/2012   Procedure: PERCUTANEOUS CORONARY STENT INTERVENTION (  PCI-S);  Surgeon: Troy Sine, MD;  Location: [facility] CATH LAB;  Service: Cardiovascular;  Laterality: N/A;  . PERIPHERAL VASCULAR CATHETERIZATION N/A 05/05/2015   Procedure: Glori Luis Cath Insertion;  Surgeon: Algernon Huxley, MD;  Location: [facility] CV LAB;  Service: Cardiovascular;  Laterality: N/A;    Home Medications:  Allergies as of 10/16/2016      Reactions     Ace Inhibitors Cough   Penicillins Rash      Medication List       Accurate as of 10/16/16 11:09 AM. Always use your most recent med list.          aspirin EC 81 MG tablet Take 81 mg by mouth daily.   atorvastatin 80 MG tablet Commonly known as:  LIPITOR TAKE 1 TABLET (80 MG TOTAL) BY MOUTH DAILY AT 6 PM.   BRILINTA 60 MG Tabs tablet Generic drug:  ticagrelor TAKE 1 TABLET BY MOUTH TWICE A DAY   ezetimibe 10 MG tablet Commonly known as:  ZETIA Take 1 tablet (10 mg total) by mouth daily.   fluticasone 50 MCG/ACT nasal spray Commonly known as:  FLONASE Place into the nose.   furosemide 20 MG tablet Commonly known as:  LASIX TAKE 1 TABLET BY MOUTH DAILY.   gabapentin 100 MG capsule Commonly known as:  NEURONTIN Take by mouth. Pt takes 1 tablet in the A.M. And 2 tablets in the P.M.   isosorbide mononitrate 30 MG 24 hr tablet Commonly known as:  IMDUR Take 1 tablet (30 mg total) by mouth daily.   lidocaine-prilocaine cream Commonly known as:  EMLA Apply to affected area once   loperamide 2 MG tablet Commonly known as:  IMODIUM A-D Take 2 mg by mouth 4 (four) times daily as needed for diarrhea or loose stools.   losartan 50 MG tablet Commonly known as:  COZAAR TAKE 1 TABLET BY MOUTH EVERY DAY   multivitamin with minerals Tabs tablet Take 1 tablet by mouth daily.   NITROSTAT 0.4 MG SL tablet Generic drug:  nitroGLYCERIN PLACE 1 TABLET UNDER THE TOUGUE EVERY 5 MINUTES FOR UP TO 3 DOSES AS NEEDED FOR CHEST PAIN   omeprazole 20 MG capsule Commonly known as:  PRILOSEC Take 20 mg by mouth daily.   pyridOXINE 100 MG tablet Commonly known as:  VITAMIN B-6 Take 100 mg by mouth 2 (two) times daily.   Vitamin D (Cholecalciferol) 1000 units Caps Take 1 capsule by mouth daily.       Allergies:  Allergies  Allergen Reactions  . Ace Inhibitors Cough  . Penicillins Rash    Family History: Family History  Problem Relation Age of Onset  . Heart disease  Mother   . Breast cancer Mother 62  . Heart disease Father   . Diabetes Father   . Diabetes Brother   . Stroke Brother   . Kidney cancer Neg Hx   . Bladder Cancer Neg Hx     Social History:  reports that she quit smoking about 42 years ago. She has never used smokeless tobacco. She reports that she drinks about 0.6 oz of alcohol per week . She reports that she does not use drugs.  ROS:                                        Physical Exam: BP 115/65 (BP Location: Left Arm, Patient Position: Sitting, Cuff Size:  Large)   Pulse (!) 43   Ht 5' 4.5" (1.638 m)   Wt 202 lb 11.2 oz (91.9 kg)   BMI 34.26 kg/m   Constitutional:  Alert and oriented, No acute distress.   Laboratory Data: Lab Results  Component Value Date   WBC 4.4 08/15/2016   HGB 12.1 08/15/2016   HCT 35.0 08/15/2016   MCV 89.1 08/15/2016   PLT 209 08/15/2016    Lab Results  Component Value Date   CREATININE 0.85 08/15/2016    No results found for: PSA  No results found for: TESTOSTERONE  Lab Results  Component Value Date   HGBA1C 5.7 (H) 10/19/2012    Urinalysis    Component Value Date/Time   APPEARANCEUR Clear 09/04/2016 0932   GLUCOSEU Negative 09/04/2016 0932   BILIRUBINUR Negative 09/04/2016 0932   PROTEINUR Negative 09/04/2016 0932   NITRITE Negative 09/04/2016 0932   LEUKOCYTESUR Trace (A) 09/04/2016 0932    Pertinent Imaging: none  Assessment & Plan: The patient has mixed incontinence. 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 The risks are a little bit higher for erosion with injectable recognizing limited data. The role of percutaneous tibial nerve stimulation was discussed.  The my index of suspicion is high that the radiation caused her bladder become more overactive. She wants to think about percutaneous tibial nerve stimulation. I did mention Botox which is less ideal arguably in the  radiation setting. I will see her when necessary and she will call me and scheduled percutaneous tibial nerve stimulation if she wishes  1. Recurrent UTI  - Urinalysis, Complete - ciprofloxacin (CIPRO) tablet 500 mg; Take 1 tablet (500 mg total) by mouth once. - lidocaine (XYLOCAINE) 2 % jelly 1 application; Place 1 application into the urethra once.   No Follow-up on file.  Reece Packer, MD  [facility] : [facility] [address] [place], [place] [address] [phone]

## 2016-10-30 ENCOUNTER — Telehealth: Payer: Self-pay | Admitting: *Deleted

## 2016-10-30 NOTE — Telephone Encounter (Signed)
Called patient

## 2016-10-30 NOTE — Telephone Encounter (Signed)
  Please call patient  Await for repeat planned imaging.  M

## 2016-10-30 NOTE — Telephone Encounter (Signed)
Patient would like a referral for port removal if appropriate.

## 2016-10-30 NOTE — Telephone Encounter (Signed)
Returned patients call about having port removed and left message for patient that Dr. Mike Gip wants her to wait until after her scans and follow up in Sept. Message left to call back with questions.

## 2016-11-01 DIAGNOSIS — S32050G Wedge compression fracture of fifth lumbar vertebra, subsequent encounter for fracture with delayed healing: Secondary | ICD-10-CM | POA: Insufficient documentation

## 2016-11-01 DIAGNOSIS — M8448XG Pathological fracture, other site, subsequent encounter for fracture with delayed healing: Secondary | ICD-10-CM | POA: Insufficient documentation

## 2016-11-07 ENCOUNTER — Inpatient Hospital Stay: Payer: Medicare Other | Attending: Hematology and Oncology

## 2016-11-07 DIAGNOSIS — Z452 Encounter for adjustment and management of vascular access device: Secondary | ICD-10-CM | POA: Diagnosis not present

## 2016-11-07 DIAGNOSIS — Z90710 Acquired absence of both cervix and uterus: Secondary | ICD-10-CM | POA: Insufficient documentation

## 2016-11-07 DIAGNOSIS — Z90722 Acquired absence of ovaries, bilateral: Secondary | ICD-10-CM | POA: Insufficient documentation

## 2016-11-07 DIAGNOSIS — Z9221 Personal history of antineoplastic chemotherapy: Secondary | ICD-10-CM | POA: Diagnosis not present

## 2016-11-07 DIAGNOSIS — Z8542 Personal history of malignant neoplasm of other parts of uterus: Secondary | ICD-10-CM | POA: Diagnosis present

## 2016-11-07 DIAGNOSIS — Z923 Personal history of irradiation: Secondary | ICD-10-CM | POA: Diagnosis not present

## 2016-11-07 DIAGNOSIS — Z95828 Presence of other vascular implants and grafts: Secondary | ICD-10-CM

## 2016-11-07 MED ORDER — SODIUM CHLORIDE 0.9% FLUSH
10.0000 mL | INTRAVENOUS | Status: DC | PRN
  Administered 2016-11-07: 10 mL via INTRAVENOUS
  Filled 2016-11-07: qty 10

## 2016-11-07 MED ORDER — HEPARIN SOD (PORK) LOCK FLUSH 100 UNIT/ML IV SOLN
500.0000 [IU] | Freq: Once | INTRAVENOUS | Status: AC
  Administered 2016-11-07: 500 [IU] via INTRAVENOUS

## 2016-11-08 ENCOUNTER — Other Ambulatory Visit: Payer: Self-pay | Admitting: Cardiovascular Disease

## 2016-11-08 ENCOUNTER — Ambulatory Visit
Admission: RE | Admit: 2016-11-08 | Discharge: 2016-11-08 | Disposition: A | Discharge status: Home / Self Care | Payer: Medicare Other | Source: Ambulatory Visit | Attending: Radiation Oncology | Admitting: Radiation Oncology

## 2016-11-08 VITALS — BP 147/73 | HR 57 | Temp 97.2°F | Wt 199.6 lb

## 2016-11-08 DIAGNOSIS — Z8542 Personal history of malignant neoplasm of other parts of uterus: Secondary | ICD-10-CM | POA: Insufficient documentation

## 2016-11-08 DIAGNOSIS — Z923 Personal history of irradiation: Secondary | ICD-10-CM | POA: Diagnosis not present

## 2016-11-08 DIAGNOSIS — N393 Stress incontinence (female) (male): Secondary | ICD-10-CM | POA: Diagnosis not present

## 2016-11-08 DIAGNOSIS — Z8781 Personal history of (healed) traumatic fracture: Secondary | ICD-10-CM | POA: Diagnosis not present

## 2016-11-08 DIAGNOSIS — C541 Malignant neoplasm of endometrium: Secondary | ICD-10-CM

## 2016-11-08 NOTE — Progress Notes (Signed)
Radiation Oncology Follow up Note  Name: Susan [name]   Date:   11/08/2016 MRN:  [id number] DOB: 06-May-1934    This 82 y.o. female presents to the clinic today for 1 year follow-up status post adjuvant whole pelvic radiation for stage IIIc uterine cancer.[name]  REFERRING PROVIDER: [name], MD  HPI: Patient is a 82 year old female now out 1 year having completed whole pelvic radiation therapy. 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 She had 3 positive pelvic nodes underwent adjuvant chemotherapy with carboplatinum and Taxol discontinued secondary to neuropathy. She underwent external beam radiation therapy as well as vaginal brachytherapy. She is seen today in routine follow-up is doing fairly well. She has had some side effects including what appears to be stress fractures of the sacrum confirmed on CT scan and MRI scan performed at [facility]. She is also seeing [facility] urology for urge incontinence. She is scheduled for a type of electric stimulation for that problem. She's been evaluated at [facility] for possible surgery although has been declined she is going to see the pain clinic. She is doing fairly well she does have a stress incontinence. No significant diarrhea or dysuria. Not of her studies of imaging have shown any evidence of recurrent or persistent disease.  COMPLICATIONS OF TREATMENT: none  FOLLOW UP COMPLIANCE: keeps appointments   PHYSICAL EXAM:  BP (!) 147/73   Pulse (!) 57   Temp (!) 97.2 F (36.2 C)   Wt 199 lb 10 oz (90.5 kg)   BMI 33.74 kg/m  On speculum examination vaginal vault is clear vaginal apex has no evidence of mass. Bimanual examination shows parametrial free of mass or nodularity no evidence of mass is identified rectal exam is unremarkable. Well-developed well-nourished patient in NAD. HEENT reveals PERLA, EOMI, discs not visualized.  Oral cavity is clear. No oral mucosal lesions are  identified. Neck is clear without evidence of cervical or supraclavicular adenopathy. Lungs are clear to A&P. Cardiac examination is essentially unremarkable with regular rate and rhythm without murmur rub or thrill. Abdomen is benign with no organomegaly or masses noted. Motor sensory and DTR levels are equal and symmetric in the upper and lower extremities. Cranial nerves II through XII are grossly intact. Proprioception is intact. No peripheral adenopathy or edema is identified. No motor or sensory levels are noted. Crude visual fields are within normal range.  RADIOLOGY RESULTS: CT scans reviewed reports of her MRI scan from [facility] are also reviewed  PLAN: Present time patient is no evidence of disease. Highly unusual for whole pelvic radiation therapy to cause sacral stress fractures. This is being addressed by pain clinic at this time and patient seems to be comfortable with her current treatment regimen. I'm otherwise please were overall progress. I've asked to see her back in 1 year for follow-up. She continues close follow-up care with both GYN oncology as well as medical oncology.  I would like to take this opportunity to thank you for allowing me to participate in the care of your patient.Armstead Peaks., MD

## 2016-11-13 ENCOUNTER — Telehealth: Payer: Self-pay | Admitting: *Deleted

## 2016-11-16 ENCOUNTER — Other Ambulatory Visit: Payer: Self-pay | Admitting: Cardiovascular Disease

## 2016-11-16 DIAGNOSIS — I2510 Atherosclerotic heart disease of native coronary artery without angina pectoris: Secondary | ICD-10-CM

## 2016-11-16 NOTE — Telephone Encounter (Signed)
Rx(s) sent to pharmacy electronically.  

## 2016-11-23 ENCOUNTER — Encounter: Payer: Self-pay | Admitting: Student in an Organized Health Care Education/Training Program

## 2016-11-23 ENCOUNTER — Ambulatory Visit
Payer: Medicare Other | Attending: Student in an Organized Health Care Education/Training Program | Admitting: Student in an Organized Health Care Education/Training Program

## 2016-11-23 VITALS — BP 112/47 | HR 69 | Temp 98.2°F | Resp 16 | Ht 64.0 in | Wt 206.0 lb

## 2016-11-23 DIAGNOSIS — Z923 Personal history of irradiation: Secondary | ICD-10-CM | POA: Diagnosis not present

## 2016-11-23 DIAGNOSIS — C801 Malignant (primary) neoplasm, unspecified: Secondary | ICD-10-CM | POA: Diagnosis not present

## 2016-11-23 DIAGNOSIS — I10 Essential (primary) hypertension: Secondary | ICD-10-CM | POA: Diagnosis not present

## 2016-11-23 DIAGNOSIS — M47816 Spondylosis without myelopathy or radiculopathy, lumbar region: Secondary | ICD-10-CM | POA: Diagnosis not present

## 2016-11-23 DIAGNOSIS — Z8249 Family history of ischemic heart disease and other diseases of the circulatory system: Secondary | ICD-10-CM | POA: Insufficient documentation

## 2016-11-23 DIAGNOSIS — Z7982 Long term (current) use of aspirin: Secondary | ICD-10-CM | POA: Diagnosis not present

## 2016-11-23 DIAGNOSIS — Z9889 Other specified postprocedural states: Secondary | ICD-10-CM | POA: Insufficient documentation

## 2016-11-23 DIAGNOSIS — G8929 Other chronic pain: Secondary | ICD-10-CM | POA: Insufficient documentation

## 2016-11-23 DIAGNOSIS — M48061 Spinal stenosis, lumbar region without neurogenic claudication: Secondary | ICD-10-CM | POA: Diagnosis not present

## 2016-11-23 DIAGNOSIS — I2102 ST elevation (STEMI) myocardial infarction involving left anterior descending coronary artery: Secondary | ICD-10-CM

## 2016-11-23 DIAGNOSIS — I2510 Atherosclerotic heart disease of native coronary artery without angina pectoris: Secondary | ICD-10-CM | POA: Diagnosis not present

## 2016-11-23 DIAGNOSIS — Z85828 Personal history of other malignant neoplasm of skin: Secondary | ICD-10-CM | POA: Insufficient documentation

## 2016-11-23 DIAGNOSIS — Z87891 Personal history of nicotine dependence: Secondary | ICD-10-CM | POA: Diagnosis not present

## 2016-11-23 DIAGNOSIS — Z880 Allergy status to penicillin: Secondary | ICD-10-CM | POA: Insufficient documentation

## 2016-11-23 DIAGNOSIS — G629 Polyneuropathy, unspecified: Secondary | ICD-10-CM | POA: Diagnosis present

## 2016-11-23 DIAGNOSIS — Z833 Family history of diabetes mellitus: Secondary | ICD-10-CM | POA: Insufficient documentation

## 2016-11-23 DIAGNOSIS — R1030 Lower abdominal pain, unspecified: Secondary | ICD-10-CM | POA: Insufficient documentation

## 2016-11-23 DIAGNOSIS — E785 Hyperlipidemia, unspecified: Secondary | ICD-10-CM | POA: Diagnosis not present

## 2016-11-23 DIAGNOSIS — M8448XA Pathological fracture, other site, initial encounter for fracture: Secondary | ICD-10-CM

## 2016-11-23 DIAGNOSIS — I252 Old myocardial infarction: Secondary | ICD-10-CM | POA: Diagnosis not present

## 2016-11-23 DIAGNOSIS — Z79899 Other long term (current) drug therapy: Secondary | ICD-10-CM | POA: Diagnosis not present

## 2016-11-23 DIAGNOSIS — Z823 Family history of stroke: Secondary | ICD-10-CM | POA: Insufficient documentation

## 2016-11-23 DIAGNOSIS — G63 Polyneuropathy in diseases classified elsewhere: Secondary | ICD-10-CM

## 2016-11-23 DIAGNOSIS — K219 Gastro-esophageal reflux disease without esophagitis: Secondary | ICD-10-CM | POA: Diagnosis not present

## 2016-11-23 DIAGNOSIS — M549 Dorsalgia, unspecified: Secondary | ICD-10-CM | POA: Diagnosis not present

## 2016-11-23 DIAGNOSIS — Z90710 Acquired absence of both cervix and uterus: Secondary | ICD-10-CM | POA: Diagnosis not present

## 2016-11-23 DIAGNOSIS — Z888 Allergy status to other drugs, medicaments and biological substances status: Secondary | ICD-10-CM | POA: Diagnosis not present

## 2016-11-23 DIAGNOSIS — Z955 Presence of coronary angioplasty implant and graft: Secondary | ICD-10-CM | POA: Insufficient documentation

## 2016-11-23 DIAGNOSIS — Z803 Family history of malignant neoplasm of breast: Secondary | ICD-10-CM | POA: Insufficient documentation

## 2016-11-23 DIAGNOSIS — Z87442 Personal history of urinary calculi: Secondary | ICD-10-CM | POA: Insufficient documentation

## 2016-11-23 DIAGNOSIS — I255 Ischemic cardiomyopathy: Secondary | ICD-10-CM | POA: Diagnosis not present

## 2016-11-23 DIAGNOSIS — Z9221 Personal history of antineoplastic chemotherapy: Secondary | ICD-10-CM | POA: Insufficient documentation

## 2016-11-23 MED ORDER — GABAPENTIN 300 MG PO CAPS
300.0000 mg | ORAL_CAPSULE | Freq: Three times a day (TID) | ORAL | 1 refills | Status: DC
Start: 2016-11-23 — End: 2017-02-26

## 2016-11-23 NOTE — Progress Notes (Signed)
Patient's Name: Susan [name]  MRN: [id number]  Referring Provider: Deetta Perla, MD  DOB: Mar 24, 1935  PCP: [name], MD  DOS: 11/23/2016  Note by: Gillis Santa, MD  Service setting: Ambulatory outpatient  Specialty: Interventional Pain Management  Location: [facility] (AMB) Pain Management Facility  Visit type: Initial Patient Evaluation  Patient type: New Patient   Primary Reason(s) for Visit: Encounter for initial evaluation of one or more chronic problems (new to examiner) potentially causing chronic pain, and posing a threat to normal musculoskeletal function. (Level of risk: High) CC: Back Pain (lower spine, sacral fx from radiation); Groin Pain (right referred pain from the fx's ); and Wrist Pain (left)  HPI  Susan [name] is a 82 y.o. year old, female patient, who comes today to see [place] for the first time for an initial evaluation of her chronic pain. She has STEMI 10/19/12- LAD DES, and CFX DES 10/24/12; HTN (hypertension); Cardiomyopathy, ischemic: EF 35-40% by echo post-MI 10/20/12; CAD (coronary artery disease)- elective CFX DES 10/24/12 for recurrent angina; Dyslipidemia; NSVT (nonsustained ventricular tachycardia) ([place]); Bradycardia- tolerating low dose beta blocker; Hematuria; Kidney stones; Endometrial cancer ([place]); CA skin, basal cell; Narrowing of intervertebral disc space; Accumulation of fluid in tissues; Benign essential HTN; Gastro-esophageal reflux disease without esophagitis; Hyperlipidemia; Calculus of kidney; Lumbar canal stenosis; Osteopenia; Arthritis, degenerative; Carcinoma of endometrium ([place]); Postoperative state; Neuralgia neuritis, sciatic nerve; Edema; Heart murmur; Absence of bladder continence; Neuropathy associated with cancer ([place]); History of endometrial cancer; and Sacral insufficiency fracture on her problem list. Today she comes in for evaluation of her Back Pain (lower spine, sacral fx from radiation); Groin Pain (right referred pain from the fx's ); and Wrist Pain  (left)  Pain Assessment: Location: Lower Back (feet, peripheral neuropathy, ankles bilateral from fall and left wrist pain. ) Radiating: into the groin area on the right  Onset: More than a month ago Duration: Chronic pain Quality: Stabbing, Constant (numbness in the feet and heavy.  ankles, right sore to touch, left ankle hurts during weight bearing. ) Severity: 2 /10 (self-reported pain score)  Note: Reported level is compatible with observation.                   Effect on ADL: gait is affected by neuropathies.  radiation fx's have also caused leakage of urine upon rising. patient uses walker at home  Timing: Constant Modifying factors: getting into a recliner with legs elevated, ice, pool exercises, laying on couch  Onset and Duration: Present longer than 3 months Cause of pain: cancer radiation tx'ment Severity: NAS-11 at its worse: 10/10, NAS-11 at its best: 2/10, NAS-11 now: 2/10 and NAS-11 on the average: 2/10 Timing: Afternoon, During activity or exercise and After activity or exercise Aggravating Factors: Bending, Lifiting, Prolonged standing and Walking Alleviating Factors: Lying down, Sitting and Sleeping Associated Problems: Inability to control bladder (urine), Numbness, Swelling and Tingling Quality of Pain: Cramping, Shooting and Stabbing Previous Examinations or Tests: Biopsy, CT scan, MRI scan and X-rays Previous Treatments: Physical Therapy and Pool exercises  The patient comes into the clinics today for the first time for a chronic pain management evaluation.  82 year old female with a past mental history of uterine cancer status post chemotherapy, radiation, hysterectomy, remote MI who is seen regarding her axial low back pain in the setting of sacral fractures.  Patient states that her low back pain while present for many years worsened acutely after her chemotherapy in January 2018. Describes a stabbing pain in the back  which wraps around but does not radiate  beyond her buttock region.  Patient is a Oceanographer and teaches daily classes. She takes gabapentin 200 mg twice a day which was increased from 100 mg twice a day for neuropathy secondary to chemotherapy.  Today I took the time to provide the patient with information regarding my pain practice. The patient was informed that my practice is divided into two sections: an interventional pain management section, as well as a completely separate and distinct medication management section. I explained that I have procedure days for my interventional therapies, and evaluation days for follow-ups and medication management. Because of the amount of documentation required during both, they are kept separated. This means that there is the possibility that she may be scheduled for a procedure on one day, and medication management the next. I have also informed her that because of staffing and facility limitations, I no longer take patients for medication management only. To illustrate the reasons for this, I gave the patient the example of surgeons, and how inappropriate it would be to refer a patient to his/her care, just to write for the post-surgical antibiotics on a surgery done by a different surgeon.   Because interventional pain management is my board-certified specialty, the patient was informed that joining my practice means that they are open to any and all interventional therapies. I made it clear that this does not mean that they will be forced to have any procedures done. What this means is that I believe interventional therapies to be essential part of the diagnosis and proper management of chronic pain conditions. Therefore, patients not interested in these interventional alternatives will be better served under the care of a different practitioner.  The patient was also made aware of my Comprehensive Pain Management Safety Guidelines where by joining my practice, they limit all of their nerve  blocks and joint injections to those done by our practice, for as long as we are retained to manage their care.   Historic Controlled Substance Pharmacotherapy Review  PMP and historical list of controlled substances: Oxycodone 5 mg, quantity 30, last fill 06/28/2015 Patient currently is not on opioid therapy and is not interested.  Medications: The patient did not bring the medication(s) to the appointment, as requested in our "New Patient Package" Pharmacodynamics: Desired effects: Analgesia: The patient reports >50% benefit. Reported improvement in function: The patient reports medication allows her to accomplish basic ADLs. Clinically meaningful improvement in function (CMIF): Sustained CMIF goals met Perceived effectiveness: Described as relatively effective, allowing for increase in activities of daily living (ADL) Undesirable effects: Side-effects or Adverse reactions: None reported Historical Monitoring: The patient  reports that she does not use drugs. List of all UDS Test(s): No results found for: MDMA, COCAINSCRNUR, PCPSCRNUR, PCPQUANT, CANNABQUANT, THCU, [place] List of all Serum Drug Screening Test(s):  No results found for: AMPHSCRSER, BARBSCRSER, BENZOSCRSER, COCAINSCRSER, PCPSCRSER, PCPQUANT, THCSCRSER, CANNABQUANT, OPIATESCRSER, OXYSCRSER, PROPOXSCRSER Historical Background Evaluation: [place] PDMP: Six (6) year initial data search conducted.              Department of public safety, offender search: Editor, commissioning Information) Non-contributory Risk Assessment Profile: Aberrant behavior: None observed or detected today Risk factors for fatal opioid overdose: None identified today Fatal overdose hazard ratio (HR): Calculation deferred Non-fatal overdose hazard ratio (HR): Calculation deferred Risk of opioid abuse or dependence: 0.7-3.0% with doses ? 36 MME/day and 6.1-26% with doses ? 120 MME/day. Substance use disorder (SUD) risk level: Pending results of Medical  Psychology  Evaluation for SUD Opioid risk tool (ORT) (Total Score): 1     Opioid Risk Tool - 11/23/16 1411      Family History of Substance Abuse   Alcohol Positive Female  father   Illegal Drugs Negative   Rx Drugs Negative     Personal History of Substance Abuse   Alcohol Negative   Illegal Drugs Negative   Rx Drugs Negative     Psychological Disease   Psychological Disease Negative   Depression Negative     Total Score   Opioid Risk Tool Scoring 1   Opioid Risk Interpretation Low Risk     ORT Scoring interpretation table:  Score <3 = Low Risk for SUD  Score between 4-7 = Moderate Risk for SUD  Score >8 = High Risk for Opioid Abuse     Pharmacologic Plan: Pending ordered tests and/or consults            Initial impression: The patient indicated having no interest, at this time.  Meds   Current Outpatient Prescriptions:  .  aspirin EC 81 MG tablet, Take 81 mg by mouth daily., Disp: , Rfl:  .  atorvastatin (LIPITOR) 80 MG tablet, TAKE 1 TABLET (80 MG TOTAL) BY MOUTH DAILY AT 6 PM., Disp: 90 tablet, Rfl: 1 .  BRILINTA 60 MG TABS tablet, TAKE 1 TABLET BY MOUTH TWICE A DAY, Disp: 60 tablet, Rfl: 5 .  ezetimibe (ZETIA) 10 MG tablet, TAKE 1 TABLET (10 MG TOTAL) BY MOUTH DAILY., Disp: 90 tablet, Rfl: 1 .  fluticasone (FLONASE) 50 MCG/ACT nasal spray, Place into the nose., Disp: , Rfl:  .  furosemide (LASIX) 20 MG tablet, TAKE 1 TABLET BY MOUTH DAILY., Disp: 90 tablet, Rfl: 0 .  isosorbide mononitrate (IMDUR) 30 MG 24 hr tablet, TAKE 1 TABLET (30 MG TOTAL) BY MOUTH DAILY., Disp: 90 tablet, Rfl: 1 .  lidocaine-prilocaine (EMLA) cream, Apply to affected area once, Disp: 30 g, Rfl: 3 .  loperamide (IMODIUM A-D) 2 MG tablet, Take 2 mg by mouth 4 (four) times daily as needed for diarrhea or loose stools., Disp: , Rfl:  .  losartan (COZAAR) 50 MG tablet, TAKE 1 TABLET BY MOUTH EVERY DAY, Disp: 90 tablet, Rfl: 0 .  Multiple Vitamin (MULTIVITAMIN WITH MINERALS) TABS, Take 1 tablet by mouth  daily., Disp: , Rfl:  .  NITROSTAT 0.4 MG SL tablet, PLACE 1 TABLET UNDER THE TOUGUE EVERY 5 MINUTES FOR UP TO 3 DOSES AS NEEDED FOR CHEST PAIN, Disp: 25 tablet, Rfl: 0 .  omeprazole (PRILOSEC) 20 MG capsule, Take 20 mg by mouth daily., Disp: , Rfl:  .  pyridOXINE (VITAMIN B-6) 100 MG tablet, Take 100 mg by mouth 2 (two) times daily., Disp: , Rfl:  .  Vitamin D, Cholecalciferol, 1000 UNITS CAPS, Take 1 capsule by mouth daily., Disp: , Rfl:  .  gabapentin (NEURONTIN) 300 MG capsule, Take 1 capsule (300 mg total) by mouth 3 (three) times daily., Disp: 90 capsule, Rfl: 1 No current facility-administered medications for this visit.   Facility-Administered Medications Ordered in Other Visits:  .  sodium chloride flush (NS) 0.9 % injection 10 mL, 10 mL, Intravenous, PRN, Charlaine Dalton R, MD, 10 mL at 06/06/16 1447 .  sodium chloride flush (NS) 0.9 % injection 10 mL, 10 mL, Intravenous, PRN, Cammie Sickle, MD, 10 mL at 08/15/16 1404  Imaging Review   Lumbosacral Imaging: Lumbar MR wo contrast: No results found for this or any previous visit. Lumbar MR  wo contrast:  Results for orders placed in visit on 10/10/10  MR [facility] W/O Cm   Narrative  PRIOR REPORT IMPORTED FROM THE [facility] WORKFLOW SYSTEM   REASON FOR EXAM:    low back pain hx of spinal stenosis radicular back  pain  COMMENTS:   PROCEDURE:     MR  - MR LUMBAR SPINE WO CONTRAST  - Oct 10 2010  9:57AM   RESULT:   HISTORY:    Low back pain, pain in both lower extremities.   COMPARISON STUDY:   No prior.   FINDINGS: Multiplanar, multisequence imaging of the lumbar spine is  obtained.   T12-L1:  Left paracentral mild annular bulge and/or disc protrusion is  present. This is small results in minimal thecal sac deformity.   L1-L2: Large central disc protrusion is present with deformity of the  thecal  sac. Sequestered fragment may be present.   L2-L3: Right paracentral disc protrusion with annular bulge and  deformity  of  the thecal sac on the right. There is prominent compression of the right  lateral recess. Facetal hypertrophy contributes to these findings. There  is  a prominent narrowing of the right neural foramen also at this level.   L3-L4: Annular bulge. This with facetal hypertrophy and hypertrophy of the  ligamentum flavum results in moderate flattening of the lateral recesses.  Thecal sac is narrowed to 9 mm at this level.   L4-L5: Prominent annular bulge and facetal hypertrophy with prominent  hypertrophy of the ligamenta flavum. Thecal sac is narrowed to 6 mm at  this  level.   L5-S1: Annular bulge with left neural foraminal narrowing. The neural  foraminal narrowing is some prominent.   No acute bony abnormality is identified. The lumbar cord is normal.  Thoracic  disc protrusions are noted. Thoracic MRI can be obtained for further  evaluation. Bilateral renal cysts are present.   IMPRESSION:   1. Multilevel severe degenerative changes of the lumbar spinal levels with  level specific findings as above.  2. Thoracic disc protrusions. Thoracic MRI can be obtained for further  evaluation.   Thank you for the opportunity to contribute to the care of your patient.        Complexity Note: Imaging results reviewed. Results discussed using Layman's terms.               ROS  Cardiovascular History: Heart trouble, Daily Aspirin intake, Heart murmur and Blood thinners:  Anticoagulant Pulmonary or Respiratory History: Snoring  Neurological History: Abnormal skin sensations (Peripheral Neuropathy) Review of Past Neurological Studies: No results found for this or any previous visit. Psychological-Psychiatric History: No reported psychological or psychiatric signs or symptoms such as difficulty sleeping, anxiety, depression, delusions or hallucinations (schizophrenial), mood swings (bipolar disorders) or suicidal ideations or attempts Gastrointestinal History: No reported  gastrointestinal signs or symptoms such as vomiting or evacuating blood, reflux, heartburn, alternating episodes of diarrhea and constipation, inflamed or scarred liver, or pancreas or irrregular and/or infrequent bowel movements Genitourinary History: Passing kidney stones Hematological History: No reported hematological signs or symptoms such as prolonged bleeding, low or poor functioning platelets, bruising or bleeding easily, hereditary bleeding problems, low energy levels due to low hemoglobin or being anemic Endocrine History: No reported endocrine signs or symptoms such as high or low blood sugar, rapid heart rate due to high thyroid levels, obesity or weight gain due to slow thyroid or thyroid disease Rheumatologic History: Rheumatoid arthritis Musculoskeletal History: Negative for myasthenia gravis, muscular  dystrophy, multiple sclerosis or malignant hyperthermia Work History: Retired  Allergies  Ms. Baik is allergic to ace inhibitors and penicillins.  Laboratory Chemistry  Inflammation Markers (CRP: Acute Phase) (ESR: Chronic Phase) No results found for: CRP, ESRSEDRATE               Renal Function Markers Lab Results  Component Value Date   BUN 16 08/15/2016   CREATININE 0.85 08/15/2016   GFRAA >60 08/15/2016   GFRNONAA >60 08/15/2016                 Hepatic Function Markers Lab Results  Component Value Date   AST 33 08/15/2016   ALT 18 08/15/2016   ALBUMIN 4.1 08/15/2016   ALKPHOS 132 (H) 08/15/2016                 Electrolytes Lab Results  Component Value Date   NA 139 08/15/2016   K 3.9 08/15/2016   CL 108 08/15/2016   CALCIUM 9.3 08/15/2016   MG 2.2 07/14/2015                 Neuropathy Markers Lab Results  Component Value Date   VITAMINB12 1,218 (H) 07/21/2015                 Bone Pathology Markers Lab Results  Component Value Date   ALKPHOS 132 (H) 08/15/2016   CALCIUM 9.3 08/15/2016                 Coagulation Parameters Lab Results   Component Value Date   INR 1.0 12/17/2013   LABPROT 13.3 12/17/2013   APTT 91 (H) 10/19/2012   PLT 209 08/15/2016                 Cardiovascular Markers Lab Results  Component Value Date   HGB 12.1 08/15/2016   HCT 35.0 08/15/2016                 Note: Lab results reviewed.  [place]  Drug: Ms. Forness  reports that she does not use drugs. Alcohol:  reports that she drinks about 0.6 oz of alcohol per week . Tobacco:  reports that she quit smoking about 42 years ago. She has never used smokeless tobacco. Medical:  has a past medical history of Bradycardia; Coronary artery disease; Heart murmur; Hyperlipidemia; Hypertension; Myocardial infarction [facility]); NSVT (nonsustained ventricular tachycardia) ([place]); Personal history of chemotherapy; Personal history of radiation therapy; and Uterine cancer ([place]). Family: family history includes Breast cancer (age of onset: 64) in her mother; Diabetes in her brother and father; Heart disease in her father and mother; Stroke in her brother.  Past Surgical History:  Procedure Laterality Date  . ABDOMINAL HYSTERECTOMY    . BREAST EXCISIONAL BIOPSY Right 1956   NEG  . LEFT HEART CATHETERIZATION WITH CORONARY ANGIOGRAM  10/19/2012   Procedure: LEFT HEART CATHETERIZATION WITH CORONARY ANGIOGRAM;  Surgeon: Troy Sine, MD;  Location: [facility] C[facility] LAB;  Service: Cardiovascular;;  . PERCUTANEOUS CORONARY STENT INTERVENTION (PCI-S)  10/19/2012   Procedure: PERCUTANEOUS CORONARY STENT INTERVENTION (PCI-S);  Surgeon: Troy Sine, MD;  Location: [facility] CATH LAB;  Service: Cardiovascular;;  . PERCUTANEOUS CORONARY STENT INTERVENTION (PCI-S) N/A 10/23/2012   Procedure: PERCUTANEOUS CORONARY STENT INTERVENTION (PCI-S);  Surgeon: Troy Sine, MD;  Location: [facility] CATH LAB;  Service: Cardiovascular;  Laterality: N/A;  . PERIPHERAL VASCULAR CATHETERIZATION N/A 05/05/2015   Procedure: Glori Luis Cath Insertion;  Surgeon: Algernon Huxley, MD;  Location: [facility] INVASIVE CV  LAB;  Service:  Cardiovascular;  Laterality: N/A;   Active Ambulatory Problems    Diagnosis Date Noted  . STEMI 10/19/12- LAD DES, and CFX DES 10/24/12 10/20/2012  . HTN (hypertension) 10/20/2012  . Cardiomyopathy, ischemic: EF 35-40% by echo post-MI 10/20/12 10/22/2012  . CAD (coronary artery disease)- elective CFX DES 10/24/12 for recurrent angina 10/25/2012  . Dyslipidemia 10/25/2012  . NSVT (nonsustained ventricular tachycardia) ([place]) 10/25/2012  . Bradycardia- tolerating low dose beta blocker 10/25/2012  . Hematuria 12/19/2012  . Kidney stones 10/27/2013  . Endometrial cancer ([place]) 03/24/2015  . CA skin, basal cell 06/30/2013  . Narrowing of intervertebral disc space 06/30/2013  . Accumulation of fluid in tissues 06/30/2013  . Benign essential HTN 06/30/2013  . Gastro-esophageal reflux disease without esophagitis 02/15/2015  . Hyperlipidemia 02/13/2014  . Calculus of kidney 06/30/2013  . Lumbar canal stenosis 06/30/2013  . Osteopenia 08/17/2014  . Arthritis, degenerative 06/30/2013  . Carcinoma of endometrium ([place]) 04/02/2015  . Postoperative state 04/08/2015  . Neuralgia neuritis, sciatic nerve 06/30/2013  . Edema 06/30/2013  . Heart murmur 06/30/2013  . Absence of bladder continence 07/21/2015  . Neuropathy associated with cancer ([place]) 06/02/2015  . History of endometrial cancer 08/23/2016  . Sacral insufficiency fracture 09/05/2016   Resolved Ambulatory Problems    Diagnosis Date Noted  . Post-infarction angina ([place]) 10/21/2012   Past Medical History:  Diagnosis Date  . Bradycardia   . Coronary artery disease   . Heart murmur   . Hyperlipidemia   . Hypertension   . Myocardial infarction ([place])   . NSVT (nonsustained ventricular tachycardia) ([place])   . Personal history of chemotherapy   . Personal history of radiation therapy   . Uterine cancer ([place])    Constitutional Exam  General appearance: Well nourished, well developed, and well hydrated. In no apparent acute  distress Vitals:   11/23/16 1350  BP: (!) 112/47  Pulse: 69  Resp: 16  Temp: 98.2 F (36.8 C)  TempSrc: Oral  SpO2: 99%  Weight: 206 lb (93.4 kg)  Height: '5\' 4"'  (1.626 m)   BMI Assessment: Estimated body mass index is 35.36 kg/m as calculated from the following:   Height as of this encounter: '5\' 4"'  (1.626 m).   Weight as of this encounter: 206 lb (93.4 kg).  BMI interpretation table: BMI level Category Range association with higher incidence of chronic pain  <18 kg/m2 Underweight   18.5-24.9 kg/m2 Ideal body weight   25-29.9 kg/m2 Overweight Increased incidence by 20%  30-34.9 kg/m2 Obese (Class I) Increased incidence by 68%  35-39.9 kg/m2 Severe obesity (Class II) Increased incidence by 136%  >40 kg/m2 Extreme obesity (Class III) Increased incidence by 254%   BMI Readings from Last 4 Encounters:  11/23/16 35.36 kg/m  11/08/16 33.74 kg/m  10/16/16 34.26 kg/m  09/04/16 33.37 kg/m   Wt Readings from Last 4 Encounters:  11/23/16 206 lb (93.4 kg)  11/08/16 199 lb 10 oz (90.5 kg)  10/16/16 202 lb 11.2 oz (91.9 kg)  09/04/16 197 lb 7 oz (89.6 kg)  Psych/Mental status: Alert, oriented x 3 (person, place, & time)       Eyes: PERLA Respiratory: No evidence of acute respiratory distress Right Port-A-Cath in place  Cervical Spine Area Exam  Skin & Axial Inspection: No masses, redness, edema, swelling, or associated skin lesions Alignment: Symmetrical Functional ROM: Unrestricted ROM      Stability: No instability detected Muscle Tone/Strength: Functionally intact. No obvious neuro-muscular anomalies detected. Sensory (Neurological): Unimpaired Palpation:  No palpable anomalies              Upper Extremity (UE) Exam    Side: Right upper extremity  Side: Left upper extremity  Skin & Extremity Inspection: Skin color, temperature, and hair growth are WNL. No peripheral edema or cyanosis. No masses, redness, swelling, asymmetry, or associated skin lesions. No contractures.   Skin & Extremity Inspection: Skin color, temperature, and hair growth are WNL. No peripheral edema or cyanosis. No masses, redness, swelling, asymmetry, or associated skin lesions. No contractures.  Functional ROM: Unrestricted ROM          Functional ROM: Unrestricted ROM          Muscle Tone/Strength: Functionally intact. No obvious neuro-muscular anomalies detected.  Muscle Tone/Strength: Functionally intact. No obvious neuro-muscular anomalies detected.  Sensory (Neurological): Unimpaired          Sensory (Neurological): Unimpaired          Palpation: No palpable anomalies              Palpation: No palpable anomalies              Specialized Test(s): Deferred         Specialized Test(s): Deferred          Thoracic Spine Area Exam  Skin & Axial Inspection: No masses, redness, or swelling Alignment: Symmetrical Functional ROM: Unrestricted ROM Stability: No instability detected Muscle Tone/Strength: Functionally intact. No obvious neuro-muscular anomalies detected. Sensory (Neurological): Unimpaired Muscle strength & Tone: No palpable anomalies  Lumbar Spine Area Exam  Skin & Axial Inspection: No masses, redness, or swelling Alignment: Symmetrical Functional ROM: Unrestricted ROM      Stability: No instability detected Muscle Tone/Strength: Functionally intact. No obvious neuro-muscular anomalies detected. Sensory (Neurological): Movement-associated pain Palpation: Complains of area being tender to palpation       Provocative Tests: Lumbar Hyperextension and rotation test: Positive       Lumbar Lateral bending test: Positive       Patrick's Maneuver: evaluation deferred today                    Gait & Posture Assessment  Ambulation: Unassisted Gait: Relatively normal for age and body habitus Posture: WNL   Lower Extremity Exam    Side: Right lower extremity  Side: Left lower extremity  Skin & Extremity Inspection: Skin color, temperature, and hair growth are WNL. No peripheral  edema or cyanosis. No masses, redness, swelling, asymmetry, or associated skin lesions. No contractures.  Skin & Extremity Inspection: Skin color, temperature, and hair growth are WNL. No peripheral edema or cyanosis. No masses, redness, swelling, asymmetry, or associated skin lesions. No contractures.  Functional ROM: Unrestricted ROM          Functional ROM: Unrestricted ROM          Muscle Tone/Strength: Functionally intact. No obvious neuro-muscular anomalies detected.  Muscle Tone/Strength: Functionally intact. No obvious neuro-muscular anomalies detected.  Sensory (Neurological): Unimpaired  Sensory (Neurological): Unimpaired  Palpation: No palpable anomalies  Palpation: No palpable anomalies   Assessment  Primary Diagnosis & Pertinent Problem List: The primary encounter diagnosis was Neuropathy associated with cancer ([place]). Diagnoses of Spondylosis without myelopathy or radiculopathy, lumbar region, STEMI 10/19/12- LAD DES, and CFX DES 10/24/12, Sacral insufficiency fracture, initial encounter, Benign essential HTN, Cardiomyopathy, ischemic: EF 35-40% by echo post-MI 10/20/12, and Essential hypertension were also pertinent to this visit.  Visit Diagnosis (New problems to examiner): 1. Neuropathy associated with cancer ([place])  2. Spondylosis without myelopathy or radiculopathy, lumbar region   3. STEMI 10/19/12- LAD DES, and CFX DES 10/24/12   4. Sacral insufficiency fracture, initial encounter   5. Benign essential HTN   6. Cardiomyopathy, ischemic: EF 35-40% by echo post-MI 10/20/12   7. Essential hypertension    Plan of Care (Initial workup plan)   82 year old female with a past mental history of uterine cancer status post chemotherapy, radiation, hysterectomy, remote MI who is seen regarding her axial low back pain in the setting of sacral fractures. Patient was seen at [facility] for her sacral fractures regarding possible sacroplasty which the patient was not deemed a candidate for.  Patient  is interested in non-opioid modalities for pain management. Etiology of patient's low back pain is likely secondary to chemotherapy and radiation therapy induced neuropathy, lumbar spondylosis, sacral fractures, lumbosacral degenerative disc disease. Patient is an Catering manager and teaches classes daily. She states that movement and exercise helps her pain. I encouraged her to continue with that.  I also instructed the patient to increase her gabapentin to 300 mg 3 times a day. On exam the patient does have pain with facet loading and lateral rotation bilaterally. This pain is described as dull and achy that radiates to the back of her buttocks but does not extend beyond her posterior thighs. Her straight leg raise test was negative bilaterally. This suggests to me that lumbar spondylosis could be contributing to her axial low back pain. Patient's lumbar MRI shows extensive lumbar degenerative joint disease, facet arthropathy and hypertrophy, lumbar spondylosis.  We discussed diagnostic bilateral L3, L4, L5 medial branch nerve blocks with goal radiofrequency ablation which the patient wants to proceed with. Patient is anticoagulated with Brilinta. She has stopped this in the past 5 days prior to previous procedures including a hysterectomy. I have encouraged the patient to contact her cardiologist to confirm that it is okay to discontinue Brilinta 5 days prior to her scheduled procedure.  Plan: 1. Increase gabapentin to 300 mg 3 times daily. Prescription provided 2. Schedule diagnostic bilateral L3-L5 medial branch nerve blocks. Patient must receive clearance from cardiologist to stop Brilinta 5 days prior to scheduled procedure. Can resume 24 hours after procedure. 3. Continue aquatic aerobic exercises   Ordered Lab-work, Procedure(s), Referral(s), & Consult(s): Orders Placed This Encounter  Procedures  . LUMBAR FACET(MEDIAL BRANCH NERVE BLOCK) MBNB   Pharmacotherapy  (current): Medications ordered:  Meds ordered this encounter  Medications  . gabapentin (NEURONTIN) 300 MG capsule    Sig: Take 1 capsule (300 mg total) by mouth 3 (three) times daily.    Dispense:  90 capsule    Refill:  1   Medications administered during this visit: Ms. Hocutt had no medications administered during this visit.   Pharmacological management options:  Opioid Analgesics: The patient was informed that there is no guarantee that she would be a candidate for opioid analgesics. The decision will be made following CDC guidelines. This decision will be based on the results of diagnostic studies, as well as Ms. Teters's risk profile.   Membrane stabilizer: To be determined at a later time  Muscle relaxant: To be determined at a later time  NSAID: To be determined at a later time  Other analgesic(s): To be determined at a later time Cymbalta, tizanidine    Interventional management options: Ms. Graff was informed that there is no guarantee that she would be a candidate for interventional therapies. The decision will be based on the results of  diagnostic studies, as well as Ms. Cushman's risk profile.  Procedure(s) under consideration:  -Bilateral L3-L5 medial branch nerve blocks with goal RFA -SI joint injection -Piriformis injection    Provider-requested follow-up: Return for Procedure.  Future Appointments Date Time Provider [place]  12/05/2016 11:00 AM [facility]  12/07/2016 10:15 AM Lequita Asal, MD [facility] None  12/19/2016 3:00 PM [facility] None  12/27/2016 9:20 AM Troy Sine, MD [facility]  02/21/2017 11:00 AM [facility] None  11/07/2017 2:00 PM Noreene Filbert, MD [facility] None    Primary Care Physician: [name], MD Location: [facility] Note by: Gillis Santa, M.D, Date: 11/23/2016; Time: 4:58 PM  Patient Instructions   1. New prescription  for gabapentin 300 mg. Start by taking 300 mg twice daily. If no side effects at this dose, increase to 300 mg 3 times a day.  2. Schedule for bilateral L3, L4, L5 diagnostic medial branch nerve blocks with goal radiofrequency ablation. You must stop your Brilinta 5 days prior to your scheduled procedure. Please call with your cardiologist and make sure it is safe to stop this medication for your lumbar injections  .GENERAL RISKS AND COMPLICATIONS  What are the risk, side effects and possible complications? Generally speaking, most procedures are safe.  However, with any procedure there are risks, side effects, and the possibility of complications.  The risks and complications are dependent upon the sites that are lesioned, or the type of nerve block to be performed.  The closer the procedure is to the spine, the more serious the risks are.  Great care is taken when placing the radio frequency needles, block needles or lesioning probes, but sometimes complications can occur. 1. Infection: Any time there is an injection through the skin, there is a risk of infection.  This is why sterile conditions are used for these blocks.  There are four possible types of infection. 1. Localized skin infection. 2. Central Nervous System Infection-This can be in the form of Meningitis, which can be deadly. 3. Epidural Infections-This can be in the form of an epidural abscess, which can cause pressure inside of the spine, causing compression of the spinal cord with subsequent paralysis. This would require an emergency surgery to decompress, and there are no guarantees that the patient would recover from the paralysis. 4. Discitis-This is an infection of the intervertebral discs.  It occurs in about 1% of discography procedures.  It is difficult to treat and it may lead to surgery.        2. Pain: the needles have to go through skin and soft tissues, will cause soreness.       3. Damage to internal structures:  The  nerves to be lesioned may be near blood vessels or    other nerves which can be potentially damaged.       4. Bleeding: Bleeding is more common if the patient is taking blood thinners such as  aspirin, Coumadin, Ticiid, Plavix, etc., or if he/she have some genetic predisposition  such as hemophilia. Bleeding into the spinal canal can cause compression of the spinal  cord with subsequent paralysis.  This would require an emergency surgery to  decompress and there are no guarantees that the patient would recover from the  paralysis.       5. Pneumothorax:  Puncturing of a lung is a possibility, every time a needle is introduced in  the area of the chest or upper  back.  Pneumothorax refers to free air around the  collapsed lung(s), inside of the thoracic cavity (chest cavity).  Another two possible  complications related to a similar event would include: Hemothorax and Chylothorax.   These are variations of the Pneumothorax, where instead of air around the collapsed  lung(s), you may have blood or chyle, respectively.       6. Spinal headaches: They may occur with any procedures in the area of the spine.       7. Persistent CSF (Cerebro-Spinal Fluid) leakage: This is a rare problem, but may occur  with prolonged intrathecal or epidural catheters either due to the formation of a fistulous  track or a dural tear.       8. Nerve damage: By working so close to the spinal cord, there is always a possibility of  nerve damage, which could be as serious as a permanent spinal cord injury with  paralysis.       9. Death:  Although rare, severe deadly allergic reactions known as "Anaphylactic  reaction" can occur to any of the medications used.      10. Worsening of the symptoms:  We can always make thing worse.  What are the chances of something like this happening? Chances of any of this occuring are extremely low.  By statistics, you have more of a chance of getting killed in a motor vehicle accident: while driving  to the hospital than any of the above occurring .  Nevertheless, you should be aware that they are possibilities.  In general, it is similar to taking a shower.  Everybody knows that you can slip, hit your head and get killed.  Does that mean that you should not shower again?  Nevertheless always keep in mind that statistics do not mean anything if you happen to be on the wrong side of them.  Even if a procedure has a 1 (one) in a 1,000,000 (million) chance of going wrong, it you happen to be that one..Also, keep in mind that by statistics, you have more of a chance of having something go wrong when taking medications.  Who should not have this procedure? If you are on a blood thinning medication (e.g. Coumadin, Plavix, see list of "Blood Thinners"), or if you have an active infection going on, you should not have the procedure.  If you are taking any blood thinners, please inform your physician.  How should I prepare for this procedure?  Do not eat or drink anything at least six hours prior to the procedure.  Bring a driver with you .  It cannot be a taxi.  Come accompanied by an adult that can drive you back, and that is strong enough to help you if your legs get weak or numb from the local anesthetic.  Take all of your medicines the morning of the procedure with just enough water to swallow them.  If you have diabetes, make sure that you are scheduled to have your procedure done first thing in the morning, whenever possible.  If you have diabetes, take only half of your insulin dose and notify our nurse that you have done so as soon as you arrive at the clinic.  If you are diabetic, but only take blood sugar pills (oral hypoglycemic), then do not take them on the morning of your procedure.  You may take them after you have had the procedure.  Do not take aspirin or any aspirin-containing medications, at least eleven (11) days  prior to the procedure.  They may prolong bleeding.  Wear loose  fitting clothing that may be easy to take off and that you would not mind if it got stained with Betadine or blood.  Do not wear any jewelry or perfume  Remove any nail coloring.  It will interfere with some of our monitoring equipment.  NOTE: Remember that this is not meant to be interpreted as a complete list of all possible complications.  Unforeseen problems may occur.  BLOOD THINNERS The following drugs contain aspirin or other products, which can cause increased bleeding during surgery and should not be taken for 2 weeks prior to and 1 week after surgery.  If you should need take something for relief of minor pain, you may take acetaminophen which is found in Tylenol,m Datril, Anacin-3 and Panadol. It is not blood thinner. The products listed below are.  Do not take any of the products listed below in addition to any listed on your instruction sheet.  A.P.C or A.P.C with Codeine Codeine Phosphate Capsules #3 Ibuprofen Ridaura  ABC compound Congesprin Imuran rimadil  Advil Cope Indocin Robaxisal  Alka-Seltzer Effervescent Pain Reliever and Antacid Coricidin or Coricidin-D  Indomethacin Rufen  Alka-Seltzer plus Cold Medicine Cosprin Ketoprofen S-A-C Tablets  Anacin Analgesic Tablets or Capsules Coumadin Korlgesic Salflex  Anacin Extra Strength Analgesic tablets or capsules CP-2 Tablets Lanoril Salicylate  Anaprox Cuprimine Capsules Levenox Salocol  Anexsia-D Dalteparin Magan Salsalate  Anodynos Darvon compound Magnesium Salicylate Sine-off  Ansaid Dasin Capsules Magsal Sodium Salicylate  Anturane Depen Capsules Marnal Soma  APF Arthritis pain formula Dewitt's Pills Measurin Stanback  Argesic Dia-Gesic Meclofenamic Sulfinpyrazone  Arthritis Bayer Timed Release Aspirin Diclofenac Meclomen Sulindac  Arthritis pain formula Anacin Dicumarol Medipren Supac  Analgesic (Safety coated) Arthralgen Diffunasal Mefanamic Suprofen  Arthritis Strength Bufferin Dihydrocodeine Mepro Compound Suprol   Arthropan liquid Dopirydamole Methcarbomol with Aspirin Synalgos  ASA tablets/Enseals Disalcid Micrainin Tagament  Ascriptin Doan's Midol Talwin  Ascriptin A/D Dolene Mobidin Tanderil  Ascriptin Extra Strength Dolobid Moblgesic Ticlid  Ascriptin with Codeine Doloprin or Doloprin with Codeine Momentum Tolectin  Asperbuf Duoprin Mono-gesic Trendar  Aspergum Duradyne Motrin or Motrin IB Triminicin  Aspirin plain, buffered or enteric coated Durasal Myochrisine Trigesic  Aspirin Suppositories Easprin Nalfon Trillsate  Aspirin with Codeine Ecotrin Regular or Extra Strength Naprosyn Uracel  Atromid-S Efficin Naproxen Ursinus  Auranofin Capsules Elmiron Neocylate Vanquish  Axotal Emagrin Norgesic Verin  Azathioprine Empirin or Empirin with Codeine Normiflo Vitamin E  Azolid Emprazil Nuprin Voltaren  Bayer Aspirin plain, buffered or children's or timed BC Tablets or powders Encaprin Orgaran Warfarin Sodium  Buff-a-Comp Enoxaparin Orudis Zorpin  Buff-a-Comp with Codeine Equegesic Os-Cal-Gesic   Buffaprin Excedrin plain, buffered or Extra Strength Oxalid   Bufferin Arthritis Strength Feldene Oxphenbutazone   Bufferin plain or Extra Strength Feldene Capsules Oxycodone with Aspirin   Bufferin with Codeine Fenoprofen Fenoprofen Pabalate or Pabalate-SF   Buffets II Flogesic Panagesic   Buffinol plain or Extra Strength Florinal or Florinal with Codeine Panwarfarin   Buf-Tabs Flurbiprofen Penicillamine   Butalbital Compound Four-way cold tablets Penicillin   Butazolidin Fragmin Pepto-Bismol   Carbenicillin Geminisyn Percodan   Carna Arthritis Reliever Geopen Persantine   Carprofen Gold's salt Persistin   Chloramphenicol Goody's Phenylbutazone   Chloromycetin Haltrain Piroxlcam   Clmetidine heparin Plaquenil   Cllnoril Hyco-pap Ponstel   Clofibrate Hydroxy chloroquine Propoxyphen         Before stopping any of these medications, be sure to consult the physician who ordered them.  Some, such as  Coumadin (Warfarin) are ordered to prevent or treat serious conditions such as "deep thrombosis", "pumonary embolisms", and other heart problems.  The amount of time that you may need off of the medication may also vary with the medication and the reason for which you were taking it.  If you are taking any of these medications, please make sure you notify your pain physician before you undergo any procedures.         Facet Blocks Patient Information  Description: The facets are joints in the spine between the vertebrae.  Like any joints in the body, facets can become irritated and painful.  Arthritis can also effect the facets.  By injecting steroids and local anesthetic in and around these joints, we can temporarily block the nerve supply to them.  Steroids act directly on irritated nerves and tissues to reduce selling and inflammation which often leads to decreased pain.  Facet blocks may be done anywhere along the spine from the neck to the low back depending upon the location of your pain.   After numbing the skin with local anesthetic (like Novocaine), a small needle is passed onto the facet joints under x-ray guidance.  You may experience a sensation of pressure while this is being done.  The entire block usually lasts about 15-25 minutes.   Conditions which may be treated by facet blocks:   Low back/buttock pain  Neck/shoulder pain  Certain types of headaches  Preparation for the injection:  1. Do not eat any solid food or dairy products within 8 hours of your appointment. 2. You may drink clear liquid up to 3 hours before appointment.  Clear liquids include water, black coffee, juice or soda.  No milk or cream please. 3. You may take your regular medication, including pain medications, with a sip of water before your appointment.  Diabetics should hold regular insulin (if taken separately) and take 1/2 normal NPH dose the morning of the procedure.  Carry some sugar containing  items with you to your appointment. 4. A driver must accompany you and be prepared to drive you home after your procedure. 5. Bring all your current medications with you. 6. An IV may be inserted and sedation may be given at the discretion of the physician. 7. A blood pressure cuff, EKG and other monitors will often be applied during the procedure.  Some patients may need to have extra oxygen administered for a short period. 8. You will be asked to provide medical information, including your allergies and medications, prior to the procedure.  We must know immediately if you are taking blood thinners (like Coumadin/Warfarin) or if you are allergic to IV iodine contrast (dye).  We must know if you could possible be pregnant.  Possible side-effects:   Bleeding from needle site  Infection (rare, may require surgery)  Nerve injury (rare)  Numbness & tingling (temporary)  Difficulty urinating (rare, temporary)  Spinal headache (a headache worse with upright posture)  Light-headedness (temporary)  Pain at injection site (serveral days)  Decreased blood pressure (rare, temporary)  Weakness in arm/leg (temporary)  Pressure sensation in back/neck (temporary)   Call if you experience:   Fever/chills associated with headache or increased back/neck pain  Headache worsened by an upright position  New onset, weakness or numbness of an extremity below the injection site  Hives or difficulty breathing (go to the emergency room)  Inflammation or drainage at the injection site(s)  Severe back/neck pain greater than usual  New symptoms which are concerning to you  Please note:  Although the local anesthetic injected can often make your back or neck feel good for several hours after the injection, the pain will likely return. It takes 3-7 days for steroids to work.  You may not notice any pain relief for at least one week.  If effective, we will often do a series of 2-3 injections  spaced 3-6 weeks apart to maximally decrease your pain.  After the initial series, you may be a candidate for a more permanent nerve block of the facets.  If you have any questions, please call #[phone]) [facility]

## 2016-11-23 NOTE — Patient Instructions (Addendum)
1. New prescription for gabapentin 300 mg. Start by taking 300 mg twice daily. If no side effects at this dose, increase to 300 mg 3 times a day.  2. Schedule for bilateral L3, L4, L5 diagnostic medial branch nerve blocks with goal radiofrequency ablation. You must stop your Brilinta 5 days prior to your scheduled procedure. Please call with your cardiologist and make sure it is safe to stop this medication for your lumbar injections  .GENERAL RISKS AND COMPLICATIONS  What are the risk, side effects and possible complications? Generally speaking, most procedures are safe.  However, with any procedure there are risks, side effects, and the possibility of complications.  The risks and complications are dependent upon the sites that are lesioned, or the type of nerve block to be performed.  The closer the procedure is to the spine, the more serious the risks are.  Great care is taken when placing the radio frequency needles, block needles or lesioning probes, but sometimes complications can occur. 1. Infection: Any time there is an injection through the skin, there is a risk of infection.  This is why sterile conditions are used for these blocks.  There are four possible types of infection. 1. Localized skin infection. 2. Central Nervous System Infection-This can be in the form of Meningitis, which can be deadly. 3. Epidural Infections-This can be in the form of an epidural abscess, which can cause pressure inside of the spine, causing compression of the spinal cord with subsequent paralysis. This would require an emergency surgery to decompress, and there are no guarantees that the patient would recover from the paralysis. 4. Discitis-This is an infection of the intervertebral discs.  It occurs in about 1% of discography procedures.  It is difficult to treat and it may lead to surgery.        2. Pain: the needles have to go through skin and soft tissues, will cause soreness.       3. Damage to internal  structures:  The nerves to be lesioned may be near blood vessels or    other nerves which can be potentially damaged.       4. Bleeding: Bleeding is more common if the patient is taking blood thinners such as  aspirin, Coumadin, Ticiid, Plavix, etc., or if he/she have some genetic predisposition  such as hemophilia. Bleeding into the spinal canal can cause compression of the spinal  cord with subsequent paralysis.  This would require an emergency surgery to  decompress and there are no guarantees that the patient would recover from the  paralysis.       5. Pneumothorax:  Puncturing of a lung is a possibility, every time a needle is introduced in  the area of the chest or upper back.  Pneumothorax refers to free air around the  collapsed lung(s), inside of the thoracic cavity (chest cavity).  Another two possible  complications related to a similar event would include: Hemothorax and Chylothorax.   These are variations of the Pneumothorax, where instead of air around the collapsed  lung(s), you may have blood or chyle, respectively.       6. Spinal headaches: They may occur with any procedures in the area of the spine.       7. Persistent CSF (Cerebro-Spinal Fluid) leakage: This is a rare problem, but may occur  with prolonged intrathecal or epidural catheters either due to the formation of a fistulous  track or a dural tear.       8.  Nerve damage: By working so close to the spinal cord, there is always a possibility of  nerve damage, which could be as serious as a permanent spinal cord injury with  paralysis.       9. Death:  Although rare, severe deadly allergic reactions known as "Anaphylactic  reaction" can occur to any of the medications used.      10. Worsening of the symptoms:  We can always make thing worse.  What are the chances of something like this happening? Chances of any of this occuring are extremely low.  By statistics, you have more of a chance of getting killed in a motor vehicle  accident: while driving to the hospital than any of the above occurring .  Nevertheless, you should be aware that they are possibilities.  In general, it is similar to taking a shower.  Everybody knows that you can slip, hit your head and get killed.  Does that mean that you should not shower again?  Nevertheless always keep in mind that statistics do not mean anything if you happen to be on the wrong side of them.  Even if a procedure has a 1 (one) in a 1,000,000 (million) chance of going wrong, it you happen to be that one..Also, keep in mind that by statistics, you have more of a chance of having something go wrong when taking medications.  Who should not have this procedure? If you are on a blood thinning medication (e.g. Coumadin, Plavix, see list of "Blood Thinners"), or if you have an active infection going on, you should not have the procedure.  If you are taking any blood thinners, please inform your physician.  How should I prepare for this procedure?  Do not eat or drink anything at least six hours prior to the procedure.  Bring a driver with you .  It cannot be a taxi.  Come accompanied by an adult that can drive you back, and that is strong enough to help you if your legs get weak or numb from the local anesthetic.  Take all of your medicines the morning of the procedure with just enough water to swallow them.  If you have diabetes, make sure that you are scheduled to have your procedure done first thing in the morning, whenever possible.  If you have diabetes, take only half of your insulin dose and notify our nurse that you have done so as soon as you arrive at the clinic.  If you are diabetic, but only take blood sugar pills (oral hypoglycemic), then do not take them on the morning of your procedure.  You may take them after you have had the procedure.  Do not take aspirin or any aspirin-containing medications, at least eleven (11) days prior to the procedure.  They may prolong  bleeding.  Wear loose fitting clothing that may be easy to take off and that you would not mind if it got stained with Betadine or blood.  Do not wear any jewelry or perfume  Remove any nail coloring.  It will interfere with some of our monitoring equipment.  NOTE: Remember that this is not meant to be interpreted as a complete list of all possible complications.  Unforeseen problems may occur.  BLOOD THINNERS The following drugs contain aspirin or other products, which can cause increased bleeding during surgery and should not be taken for 2 weeks prior to and 1 week after surgery.  If you should need take something for relief of minor pain,  you may take acetaminophen which is found in Tylenol,m Datril, Anacin-3 and Panadol. It is not blood thinner. The products listed below are.  Do not take any of the products listed below in addition to any listed on your instruction sheet.  A.P.C or A.P.C with Codeine Codeine Phosphate Capsules #3 Ibuprofen Ridaura  ABC compound Congesprin Imuran rimadil  Advil Cope Indocin Robaxisal  Alka-Seltzer Effervescent Pain Reliever and Antacid Coricidin or Coricidin-D  Indomethacin Rufen  Alka-Seltzer plus Cold Medicine Cosprin Ketoprofen S-A-C Tablets  Anacin Analgesic Tablets or Capsules Coumadin Korlgesic Salflex  Anacin Extra Strength Analgesic tablets or capsules CP-2 Tablets Lanoril Salicylate  Anaprox Cuprimine Capsules Levenox Salocol  Anexsia-D Dalteparin Magan Salsalate  Anodynos Darvon compound Magnesium Salicylate Sine-off  Ansaid Dasin Capsules Magsal Sodium Salicylate  Anturane Depen Capsules Marnal Soma  APF Arthritis pain formula Dewitt's Pills Measurin Stanback  Argesic Dia-Gesic Meclofenamic Sulfinpyrazone  Arthritis Bayer Timed Release Aspirin Diclofenac Meclomen Sulindac  Arthritis pain formula Anacin Dicumarol Medipren Supac  Analgesic (Safety coated) Arthralgen Diffunasal Mefanamic Suprofen  Arthritis Strength Bufferin Dihydrocodeine  Mepro Compound Suprol  Arthropan liquid Dopirydamole Methcarbomol with Aspirin Synalgos  ASA tablets/Enseals Disalcid Micrainin Tagament  Ascriptin Doan's Midol Talwin  Ascriptin A/D Dolene Mobidin Tanderil  Ascriptin Extra Strength Dolobid Moblgesic Ticlid  Ascriptin with Codeine Doloprin or Doloprin with Codeine Momentum Tolectin  Asperbuf Duoprin Mono-gesic Trendar  Aspergum Duradyne Motrin or Motrin IB Triminicin  Aspirin plain, buffered or enteric coated Durasal Myochrisine Trigesic  Aspirin Suppositories Easprin Nalfon Trillsate  Aspirin with Codeine Ecotrin Regular or Extra Strength Naprosyn Uracel  Atromid-S Efficin Naproxen Ursinus  Auranofin Capsules Elmiron Neocylate Vanquish  Axotal Emagrin Norgesic Verin  Azathioprine Empirin or Empirin with Codeine Normiflo Vitamin E  Azolid Emprazil Nuprin Voltaren  Bayer Aspirin plain, buffered or children's or timed BC Tablets or powders Encaprin Orgaran Warfarin Sodium  Buff-a-Comp Enoxaparin Orudis Zorpin  Buff-a-Comp with Codeine Equegesic Os-Cal-Gesic   Buffaprin Excedrin plain, buffered or Extra Strength Oxalid   Bufferin Arthritis Strength Feldene Oxphenbutazone   Bufferin plain or Extra Strength Feldene Capsules Oxycodone with Aspirin   Bufferin with Codeine Fenoprofen Fenoprofen Pabalate or Pabalate-SF   Buffets II Flogesic Panagesic   Buffinol plain or Extra Strength Florinal or Florinal with Codeine Panwarfarin   Buf-Tabs Flurbiprofen Penicillamine   Butalbital Compound Four-way cold tablets Penicillin   Butazolidin Fragmin Pepto-Bismol   Carbenicillin Geminisyn Percodan   Carna Arthritis Reliever Geopen Persantine   Carprofen Gold's salt Persistin   Chloramphenicol Goody's Phenylbutazone   Chloromycetin Haltrain Piroxlcam   Clmetidine heparin Plaquenil   Cllnoril Hyco-pap Ponstel   Clofibrate Hydroxy chloroquine Propoxyphen         Before stopping any of these medications, be sure to consult the physician who ordered  them.  Some, such as Coumadin (Warfarin) are ordered to prevent or treat serious conditions such as "deep thrombosis", "pumonary embolisms", and other heart problems.  The amount of time that you may need off of the medication may also vary with the medication and the reason for which you were taking it.  If you are taking any of these medications, please make sure you notify your pain physician before you undergo any procedures.         Facet Blocks Patient Information  Description: The facets are joints in the spine between the vertebrae.  Like any joints in the body, facets can become irritated and painful.  Arthritis can also effect the facets.  By injecting steroids and local anesthetic in and  around these joints, we can temporarily block the nerve supply to them.  Steroids act directly on irritated nerves and tissues to reduce selling and inflammation which often leads to decreased pain.  Facet blocks may be done anywhere along the spine from the neck to the low back depending upon the location of your pain.   After numbing the skin with local anesthetic (like Novocaine), a small needle is passed onto the facet joints under x-ray guidance.  You may experience a sensation of pressure while this is being done.  The entire block usually lasts about 15-25 minutes.   Conditions which may be treated by facet blocks:   Low back/buttock pain  Neck/shoulder pain  Certain types of headaches  Preparation for the injection:  1. Do not eat any solid food or dairy products within 8 hours of your appointment. 2. You may drink clear liquid up to 3 hours before appointment.  Clear liquids include water, black coffee, juice or soda.  No milk or cream please. 3. You may take your regular medication, including pain medications, with a sip of water before your appointment.  Diabetics should hold regular insulin (if taken separately) and take 1/2 normal NPH dose the morning of the procedure.  Carry some  sugar containing items with you to your appointment. 4. A driver must accompany you and be prepared to drive you home after your procedure. 5. Bring all your current medications with you. 6. An IV may be inserted and sedation may be given at the discretion of the physician. 7. A blood pressure cuff, EKG and other monitors will often be applied during the procedure.  Some patients may need to have extra oxygen administered for a short period. 8. You will be asked to provide medical information, including your allergies and medications, prior to the procedure.  We must know immediately if you are taking blood thinners (like Coumadin/Warfarin) or if you are allergic to IV iodine contrast (dye).  We must know if you could possible be pregnant.  Possible side-effects:   Bleeding from needle site  Infection (rare, may require surgery)  Nerve injury (rare)  Numbness & tingling (temporary)  Difficulty urinating (rare, temporary)  Spinal headache (a headache worse with upright posture)  Light-headedness (temporary)  Pain at injection site (serveral days)  Decreased blood pressure (rare, temporary)  Weakness in arm/leg (temporary)  Pressure sensation in back/neck (temporary)   Call if you experience:   Fever/chills associated with headache or increased back/neck pain  Headache worsened by an upright position  New onset, weakness or numbness of an extremity below the injection site  Hives or difficulty breathing (go to the emergency room)  Inflammation or drainage at the injection site(s)  Severe back/neck pain greater than usual  New symptoms which are concerning to you  Please note:  Although the local anesthetic injected can often make your back or neck feel good for several hours after the injection, the pain will likely return. It takes 3-7 days for steroids to work.  You may not notice any pain relief for at least one week.  If effective, we will often do a series of  2-3 injections spaced 3-6 weeks apart to maximally decrease your pain.  After the initial series, you may be a candidate for a more permanent nerve block of the facets.  If you have any questions, please call #[phone]) [facility]

## 2016-11-23 NOTE — Progress Notes (Signed)
Safety precautions to be maintained throughout the outpatient stay will include: orient to surroundings, keep bed in low position, maintain call bell within reach at all times, provide assistance with transfer out of bed and ambulation.   Patient is s/p radiation/chemotherapy for endometrial cancer.  They feel that her back pain and groin pain are being caused by radiation fx's to the spine.  The port remains in her right chest and will remain until they evaluate a "spot:" on her lung in September.    Patient had  Recent fall while in [facility] for brothers funeral and hurt both ankles.  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

## 2016-11-24 ENCOUNTER — Telehealth: Payer: Self-pay | Admitting: *Deleted

## 2016-11-24 NOTE — Telephone Encounter (Signed)
Medical Clearance sent per request to stop taking Brilinta per to procedure schedule for 9/11

## 2016-11-29 ENCOUNTER — Telehealth: Payer: Self-pay | Admitting: *Deleted

## 2016-11-29 NOTE — Telephone Encounter (Signed)
Clearance from [facility] received and reviewed by Dr. Claiborne Billings.   Faxed to [facility] at [phone]

## 2016-11-30 ENCOUNTER — Telehealth: Payer: Self-pay

## 2016-11-30 ENCOUNTER — Other Ambulatory Visit: Payer: Self-pay | Admitting: *Deleted

## 2016-11-30 DIAGNOSIS — C541 Malignant neoplasm of endometrium: Secondary | ICD-10-CM

## 2016-11-30 NOTE — Telephone Encounter (Signed)
Talked withBrilinta 5 days before procedure. Pt with understanding.

## 2016-12-01 ENCOUNTER — Ambulatory Visit: Payer: Medicare Other

## 2016-12-04 ENCOUNTER — Other Ambulatory Visit: Payer: Medicare Other

## 2016-12-04 ENCOUNTER — Ambulatory Visit: Payer: Medicare Other | Admitting: Hematology and Oncology

## 2016-12-05 ENCOUNTER — Ambulatory Visit (HOSPITAL_COMMUNITY): Payer: Medicare Other

## 2016-12-05 ENCOUNTER — Ambulatory Visit
Admission: RE | Admit: 2016-12-05 | Discharge: 2016-12-05 | Disposition: A | Discharge status: Home / Self Care | Payer: Medicare Other | Source: Ambulatory Visit | Attending: Hematology and Oncology | Admitting: Hematology and Oncology

## 2016-12-05 DIAGNOSIS — R918 Other nonspecific abnormal finding of lung field: Secondary | ICD-10-CM | POA: Insufficient documentation

## 2016-12-05 DIAGNOSIS — I700 Atherosclerosis of aorta: Secondary | ICD-10-CM | POA: Diagnosis not present

## 2016-12-05 DIAGNOSIS — C541 Malignant neoplasm of endometrium: Secondary | ICD-10-CM | POA: Diagnosis not present

## 2016-12-05 LAB — POCT I-STAT CREATININE: Creatinine, Ser: 0.9 mg/dL (ref 0.44–1.00)

## 2016-12-05 MED ORDER — IOPAMIDOL (ISOVUE-300) INJECTION 61%
75.0000 mL | Freq: Once | INTRAVENOUS | Status: AC | PRN
  Administered 2016-12-05: 75 mL via INTRAVENOUS

## 2016-12-07 ENCOUNTER — Inpatient Hospital Stay: Payer: Medicare Other | Attending: Hematology and Oncology | Admitting: Hematology and Oncology

## 2016-12-07 ENCOUNTER — Encounter: Payer: Self-pay | Admitting: Hematology and Oncology

## 2016-12-07 ENCOUNTER — Other Ambulatory Visit: Payer: Self-pay | Admitting: *Deleted

## 2016-12-07 ENCOUNTER — Inpatient Hospital Stay: Payer: Medicare Other

## 2016-12-07 VITALS — BP 115/71 | HR 46 | Temp 98.0°F | Resp 12 | Ht 64.0 in | Wt 205.0 lb

## 2016-12-07 DIAGNOSIS — Z955 Presence of coronary angioplasty implant and graft: Secondary | ICD-10-CM | POA: Diagnosis not present

## 2016-12-07 DIAGNOSIS — C541 Malignant neoplasm of endometrium: Secondary | ICD-10-CM

## 2016-12-07 DIAGNOSIS — N393 Stress incontinence (female) (male): Secondary | ICD-10-CM

## 2016-12-07 DIAGNOSIS — Z8542 Personal history of malignant neoplasm of other parts of uterus: Secondary | ICD-10-CM | POA: Diagnosis present

## 2016-12-07 DIAGNOSIS — R1031 Right lower quadrant pain: Secondary | ICD-10-CM

## 2016-12-07 DIAGNOSIS — G629 Polyneuropathy, unspecified: Secondary | ICD-10-CM | POA: Diagnosis not present

## 2016-12-07 DIAGNOSIS — I252 Old myocardial infarction: Secondary | ICD-10-CM | POA: Diagnosis not present

## 2016-12-07 DIAGNOSIS — Z79899 Other long term (current) drug therapy: Secondary | ICD-10-CM

## 2016-12-07 DIAGNOSIS — I10 Essential (primary) hypertension: Secondary | ICD-10-CM | POA: Insufficient documentation

## 2016-12-07 DIAGNOSIS — Z87891 Personal history of nicotine dependence: Secondary | ICD-10-CM | POA: Insufficient documentation

## 2016-12-07 DIAGNOSIS — R918 Other nonspecific abnormal finding of lung field: Secondary | ICD-10-CM | POA: Diagnosis not present

## 2016-12-07 DIAGNOSIS — Z9221 Personal history of antineoplastic chemotherapy: Secondary | ICD-10-CM

## 2016-12-07 DIAGNOSIS — M8448XA Pathological fracture, other site, initial encounter for fracture: Secondary | ICD-10-CM | POA: Insufficient documentation

## 2016-12-07 DIAGNOSIS — X58XXXS Exposure to other specified factors, sequela: Secondary | ICD-10-CM | POA: Diagnosis not present

## 2016-12-07 DIAGNOSIS — Z803 Family history of malignant neoplasm of breast: Secondary | ICD-10-CM | POA: Diagnosis not present

## 2016-12-07 DIAGNOSIS — E785 Hyperlipidemia, unspecified: Secondary | ICD-10-CM | POA: Insufficient documentation

## 2016-12-07 DIAGNOSIS — M4316 Spondylolisthesis, lumbar region: Secondary | ICD-10-CM | POA: Diagnosis not present

## 2016-12-07 DIAGNOSIS — Z452 Encounter for adjustment and management of vascular access device: Secondary | ICD-10-CM | POA: Insufficient documentation

## 2016-12-07 DIAGNOSIS — Z7982 Long term (current) use of aspirin: Secondary | ICD-10-CM | POA: Diagnosis not present

## 2016-12-07 DIAGNOSIS — Z880 Allergy status to penicillin: Secondary | ICD-10-CM | POA: Insufficient documentation

## 2016-12-07 DIAGNOSIS — Z923 Personal history of irradiation: Secondary | ICD-10-CM | POA: Insufficient documentation

## 2016-12-07 DIAGNOSIS — Z90722 Acquired absence of ovaries, bilateral: Secondary | ICD-10-CM | POA: Diagnosis not present

## 2016-12-07 DIAGNOSIS — I2510 Atherosclerotic heart disease of native coronary artery without angina pectoris: Secondary | ICD-10-CM

## 2016-12-07 DIAGNOSIS — Z90710 Acquired absence of both cervix and uterus: Secondary | ICD-10-CM | POA: Insufficient documentation

## 2016-12-07 DIAGNOSIS — M5136 Other intervertebral disc degeneration, lumbar region: Secondary | ICD-10-CM | POA: Diagnosis not present

## 2016-12-07 DIAGNOSIS — M549 Dorsalgia, unspecified: Secondary | ICD-10-CM

## 2016-12-07 LAB — COMPREHENSIVE METABOLIC PANEL
ALT: 16 U/L (ref 14–54)
AST: 33 U/L (ref 15–41)
Albumin: 4 g/dL (ref 3.5–5.0)
Alkaline Phosphatase: 110 U/L (ref 38–126)
Anion gap: 8 (ref 5–15)
BUN: 18 mg/dL (ref 6–20)
CO2: 25 mmol/L (ref 22–32)
Calcium: 9.7 mg/dL (ref 8.9–10.3)
Chloride: 106 mmol/L (ref 101–111)
Creatinine, Ser: 0.89 mg/dL (ref 0.44–1.00)
GFR calc Af Amer: >60 mL/min (ref >60)
GFR calc non Af Amer: >60 mL/min (ref >60)
Glucose, Bld: 114 mg/dL — ABNORMAL HIGH (ref 65–99)
Potassium: 3.9 mmol/L (ref 3.5–5.1)
Sodium: 139 mmol/L (ref 135–145)
Total Bilirubin: 0.7 mg/dL (ref 0.3–1.2)
Total Protein: 6.7 g/dL (ref 6.5–8.1)

## 2016-12-07 LAB — CBC WITH DIFFERENTIAL/PLATELET
Basophils Absolute: 0 10*3/uL (ref 0–0.1)
Basophils Relative: 0 %
Eosinophils Absolute: 0.1 10*3/uL (ref 0–0.7)
Eosinophils Relative: 2 %
HCT: 35 % (ref 35.0–47.0)
Hemoglobin: 12.2 g/dL (ref 12.0–16.0)
Lymphocytes Relative: 18 %
Lymphs Abs: 0.9 10*3/uL — ABNORMAL LOW (ref 1.0–3.6)
MCH: 31.1 pg (ref 26.0–34.0)
MCHC: 34.8 g/dL (ref 32.0–36.0)
MCV: 89.4 fL (ref 80.0–100.0)
Monocytes Absolute: 0.6 10*3/uL (ref 0.2–0.9)
Monocytes Relative: 11 %
Neutro Abs: 3.6 10*3/uL (ref 1.4–6.5)
Neutrophils Relative %: 69 %
Platelets: 243 10*3/uL (ref 150–440)
RBC: 3.92 MIL/uL (ref 3.80–5.20)
RDW: 14.6 % — ABNORMAL HIGH (ref 11.5–14.5)
WBC: 5.2 10*3/uL (ref 3.6–11.0)

## 2016-12-07 NOTE — Progress Notes (Signed)
[facility] day:  12/07/16  Chief Complaint: Susan [name] is a 82 y.o. female with stage IIIC uterine cancer who is seen for review of interval abdomen and pelvic CT scan and discussion regarding direction of therapy.  HPI:  The patient was last seen in the medical oncology clinic on 09/04/2016.  At that time, she had RLQ abdominal pain.  Pain was worse with sitting and was relieved by stretching out her leg.  Exam was stable.  Abdominal and pelvic CT scan revealed no evidence of recurrent disease.  There was a new sacral insufficiency fracture.  She was referred to surgery.  Scans revealed a 3 mm pulmonary nodule.  Repeat chest CT in 3 months was recommended.  She was seen by Dr. Bonney [name] of orthopedics on 10/06/2016.  Pelvic MRI on 10/23/2016 revealed bilateral sacral insufficiency fractures with possible insufficiency fractures of the left greater than right bilateral iliac wings.  Lumbar spine MRI revealed diffuse degenerative disease.  Decision was made not to perform a sacral plasty.  She was seen by Dr. Deetta [name] on 11/07/2016.  Surgical decompression or fusion was not recommended.  Recommendation was made to the pain clinic for treatment (medication, injections, or spinal cord stimulator).  Chest CT on 12/05/2016 revealed scattered right lung nodules measuring up to 6 mm (mean diameter) in the right upper lobe along the minor fissure.  Metastatic disease was not excluded.  Recommendation was for follow-up CT in 3-6 months.  Patient has also been seen by urology Susan Ports, MD). She has continued urinary leakage. Per patient, "they can't put anything in your body if you have had radiation". Patient was offered monthly bladder stimulation treatments, however she is unsure if she is going to pursue this treatment.   Symptomatically, she is feeling "better". She is having continued back pain. She is seeing pain management.  She denies nausea,  vomiting, and diarrhea.  She has had no interval infections.    Past Medical History:  Diagnosis Date  . Bradycardia   . Coronary artery disease   . Heart murmur   . Hyperlipidemia   . Hypertension   . Myocardial infarction ([place])   . NSVT (nonsustained ventricular tachycardia) ([place])   . Personal history of chemotherapy    ENDOMETRIAL CA  . Personal history of radiation therapy    ENDOMETRIAL CA  . Uterine cancer ([place])    serrous    Past Surgical History:  Procedure Laterality Date  . ABDOMINAL HYSTERECTOMY    . BREAST EXCISIONAL BIOPSY Right 1956   NEG  . LEFT HEART CATHETERIZATION WITH CORONARY ANGIOGRAM  10/19/2012   Procedure: LEFT HEART CATHETERIZATION WITH CORONARY ANGIOGRAM;  Surgeon: Susan Sine, MD;  Location: [facility] CATH LAB;  Service: Cardiovascular;;  . PERCUTANEOUS CORONARY STENT INTERVENTION (PCI-S)  10/19/2012   Procedure: PERCUTANEOUS CORONARY STENT INTERVENTION (PCI-S);  Surgeon: Susan Sine, MD;  Location: [facility] CATH LAB;  Service: Cardiovascular;;  . PERCUTANEOUS CORONARY STENT INTERVENTION (PCI-S) N/A 10/23/2012   Procedure: PERCUTANEOUS CORONARY STENT INTERVENTION (PCI-S);  Surgeon: Susan Sine, MD;  Location: [facility] CATH LAB;  Service: Cardiovascular;  Laterality: N/A;  . PERIPHERAL VASCULAR CATHETERIZATION N/A 05/05/2015   Procedure: Susan [name];  Surgeon: Susan Huxley, MD;  Location: [facility] CV LAB;  Service: Cardiovascular;  Laterality: N/A;    Family History  Problem Relation Age of Onset  . Heart disease Mother   . Breast cancer Mother 43  . Heart disease  Father   . Diabetes Father   . Diabetes Brother   . Stroke Brother   . Kidney cancer Neg Hx   . Bladder Cancer Neg Hx     Social History:  reports that she quit smoking about 42 years ago. She has never used smokeless tobacco. She reports that she drinks about 0.6 oz of alcohol per week . She reports that she does not use drugs.  She is originally from [place].  She lives in [place].   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  She and her husband will be married 76 years in 11/2016.  [name], Susan [name], today.  Allergies:  Allergies  Allergen Reactions  . Ace Inhibitors Cough  . Penicillins Rash    Current Medications: Current Outpatient Prescriptions  Medication Sig Dispense Refill  . aspirin EC 81 MG tablet Take 81 mg by mouth daily.    [name] atorvastatin (LIPITOR) 80 MG tablet TAKE 1 TABLET (80 MG TOTAL) BY MOUTH DAILY AT 6 PM. 90 tablet 1  . BRILINTA 60 MG TABS tablet TAKE 1 TABLET BY MOUTH TWICE A DAY 60 tablet 5  . ezetimibe (ZETIA) 10 MG tablet TAKE 1 TABLET (10 MG TOTAL) BY MOUTH DAILY. 90 tablet 1  . fluticasone (FLONASE) 50 MCG/ACT nasal spray Place into the nose.    . furosemide (LASIX) 20 MG tablet TAKE 1 TABLET BY MOUTH DAILY. 90 tablet 0  . gabapentin (NEURONTIN) 300 MG capsule Take 1 capsule (300 mg total) by mouth 3 (three) times daily. 90 capsule 1  . isosorbide mononitrate (IMDUR) 30 MG 24 hr tablet TAKE 1 TABLET (30 MG TOTAL) BY MOUTH DAILY. 90 tablet 1  . lidocaine-prilocaine (EMLA) cream Apply to affected area once 30 g 3  . loperamide (IMODIUM A-D) 2 MG tablet Take 2 mg by mouth 4 (four) times daily as needed for diarrhea or loose stools.    [name] losartan (COZAAR) 50 MG tablet TAKE 1 TABLET BY MOUTH EVERY DAY 90 tablet 0  . Multiple Vitamin (MULTIVITAMIN WITH MINERALS) TABS Take 1 tablet by mouth daily.    [name] NITROSTAT 0.4 MG SL tablet PLACE 1 TABLET UNDER THE TOUGUE EVERY 5 MINUTES FOR UP TO 3 DOSES AS NEEDED FOR CHEST PAIN 25 tablet 0  . omeprazole (PRILOSEC) 20 MG capsule Take 20 mg by mouth daily.    [name] pyridOXINE (VITAMIN B-6) 100 MG tablet Take 100 mg by mouth 2 (two) times daily.    . Vitamin D, Cholecalciferol, 1000 UNITS CAPS Take 1 capsule by mouth daily.     No current facility-administered medications for this visit.    Facility-Administered Medications Ordered in Other Visits   Medication Dose Route Frequency Provider Last Rate Last Dose  . sodium chloride flush (NS) 0.9 % injection 10 mL  10 mL Intravenous PRN Cammie Sickle, MD   10 mL at 06/06/16 1447  . sodium chloride flush (NS) 0.9 % injection 10 mL  10 mL Intravenous PRN Cammie Sickle, MD   10 mL at 08/15/16 1404    Review of Systems:  GENERAL:  Feels "better".  No fevers or sweats.  Weight stable. PERFORMANCE STATUS (ECOG):  2 HEENT:  No visual changes, runny nose, sore throat, mouth sores or tenderness. Lungs: No shortness of breath or cough.  No hemoptysis.  Sleep apnea.   Cardiac:  No chest pain, palpitations, orthopnea, or PND.  MI in 2014. GI:  Frequent  stools depending diet.  Using Probiotics.  RLQ pain (see HPI).  No nausea, vomiting, constipation, melena or hematochezia.  GU:  Stress incontinence (see HPI).  No urgency, frequency, dysuria, or hematuria. Musculoskeletal:  Back issues.  No joint pain.  No muscle tenderness. Extremities:  No pain or swelling. Skin:  No rashes or skin changes. Neuro:  Neuropathy (feet and fingers).  No headache, focal weakness, balance or coordination issues. Endocrine:  No diabetes, thyroid issues, hot flashes or night sweats. Psych:  No mood changes, depression or anxiety. Pain:  Back pain. Review of systems:  All other systems reviewed and found to be negative.  Physical Exam: 197 Blood pressure 115/71, pulse (!) 46, temperature 98 F (36.7 C), temperature source Tympanic, resp. rate 12, height 5' 4" (1.626 m), weight 205 lb (93 kg), SpO2 95 %. GENERAL:  Well developed, well nourished, woman sitting comfortably in the exam room in no acute distress.  MENTAL STATUS:  Alert and oriented to person, place and time. HEAD:  Gray/white styled hair.  Normocephalic, atraumatic, face symmetric, no Cushingoid features. EYES:  Glasses.  Blue eyes.  Pupils equal round and reactive to light and accomodation.  No conjunctivitis or scleral icterus. ENT:   Oropharynx clear without lesion.  Tongue normal. Mucous membranes moist.  RESPIRATORY:  Clear to auscultation without rales, wheezes or rhonchi. CARDIOVASCULAR:  Regular rate and rhythm without murmur, rub or gallop. ABDOMEN:  Soft, non-tender, with active bowel sounds, and no hepatosplenomegaly.  No masses. Back:  Pain on palpation lower back (left side). SKIN:  No rashes, ulcers or lesions. EXTREMITIES:  Chronic lower extremity edema.  No skin discoloration or tenderness.  No palpable cords. LYMPH NODES: No palpable cervical, supraclavicular, axillary or inguinal adenopathy  NEUROLOGICAL: Appropriate. PSYCH:  Appropriate.   Appointment on 12/07/2016  Component Date Value Ref Range Status  . Sodium 12/07/2016 139  135 - 145 mmol/L Final  . Potassium 12/07/2016 3.9  3.5 - 5.1 mmol/L Final  . Chloride 12/07/2016 106  101 - 111 mmol/L Final  . CO2 12/07/2016 25  22 - 32 mmol/L Final  . Glucose, Bld 12/07/2016 114* 65 - 99 mg/dL Final  . BUN 12/07/2016 18  6 - 20 mg/dL Final  . Creatinine, Ser 12/07/2016 0.89  0.44 - 1.00 mg/dL Final  . Calcium 12/07/2016 9.7  8.9 - 10.3 mg/dL Final  . Total Protein 12/07/2016 6.7  6.5 - 8.1 g/dL Final  . Albumin 12/07/2016 4.0  3.5 - 5.0 g/dL Final  . AST 12/07/2016 33  15 - 41 U/L Final  . ALT 12/07/2016 16  14 - 54 U/L Final  . Alkaline Phosphatase 12/07/2016 110  38 - 126 U/L Final  . Total Bilirubin 12/07/2016 0.7  0.3 - 1.2 mg/dL Final  . GFR calc non Af Amer 12/07/2016 >60  >60 mL/min Final  . GFR calc Af Amer 12/07/2016 >60  >60 mL/min Final   Comment: (NOTE) The eGFR has been calculated using the CKD EPI equation. This calculation has not been validated in all clinical situations. eGFR's persistently <60 mL/min signify possible Chronic Kidney Disease.   . Anion gap 12/07/2016 8  5 - 15 Final  . WBC 12/07/2016 5.2  3.6 - 11.0 K/uL Final  . RBC 12/07/2016 3.92  3.80 - 5.20 MIL/uL Final  . Hemoglobin 12/07/2016 12.2  12.0 - 16.0 g/dL Final   . HCT 12/07/2016 35.0  35.0 - 47.0 % Final  . MCV 12/07/2016 89.4  80.0 - 100.0 fL Final  .  MCH 12/07/2016 31.1  26.0 - 34.0 pg Final  . MCHC 12/07/2016 34.8  32.0 - 36.0 g/dL Final  . RDW 12/07/2016 14.6* 11.5 - 14.5 % Final  . Platelets 12/07/2016 243  150 - 440 K/uL Final  . Neutrophils Relative % 12/07/2016 69  % Final  . Neutro Abs 12/07/2016 3.6  1.4 - 6.5 K/uL Final  . Lymphocytes Relative 12/07/2016 18  % Final  . Lymphs Abs 12/07/2016 0.9* 1.0 - 3.6 K/uL Final  . Monocytes Relative 12/07/2016 11  % Final  . Monocytes Absolute 12/07/2016 0.6  0.2 - 0.9 K/uL Final  . Eosinophils Relative 12/07/2016 2  % Final  . Eosinophils Absolute 12/07/2016 0.1  0 - 0.7 K/uL Final  . Basophils Relative 12/07/2016 0  % Final  . Basophils Absolute 12/07/2016 0.0  0 - 0.1 K/uL Final  Hospital Outpatient Visit on 12/05/2016  Component Date Value Ref Range Status  . Creatinine, Ser 12/05/2016 0.90  0.44 - 1.00 mg/dL Final    Assessment:  Susan [name] is a 82 y.o. female with stage IIIC endometrial cancer s/p TLH/BSO and staging on 04/08/2015 at [facility].  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  Tumor invaded 2 mm into a 14 mm thick myometrium.  There was no cervical involvement.   There was lymphovascular invasion.  Washings were negative.  There were 3 positive lymph nodes in the pelvis (left external iliac node: 1 cm focus without extranodal extension; less than 0.1 mm focus in 1 external right iliac node and a 0.5 mm focus in another right external iliac node).  Pathologic stage was [id number].  Abdomen and pelvic CT on 03/30/2015 revealed fluid and/or soft tissue filling the endometrial cavity, potentially prolapsing through the cervical os into the upper vagina, compatible with the reported endometrial neoplasm.  There was no definite evidence of metastatic disease in the abdomen or pelvis.  There was multiple  nonobstructive calculi in the left renal collecting system.  There was grade 1 spondylolisthesis of L4 upon L5 with extensive multilevel degenerative disc disease and facet arthropathy throughout the lumbar spine, including mild levoscoliosis.  She received 2 cycles of carboplatin and Taxol (05/12/2015 - 06/02/2015).  Chemotherapy was truncated secondary to a debilitating neuropathy.  She received 4500 cGy pelvic radiation and 1200 cGy using high dose rate remote afterloading through vaginal cylinder (completed 10/18/2015).  Abdomen and pelvic CT on 08/31/2016 revealed no evidence of recurrent malignancy in the abdomen or pelvis.  There was a solid right middle lobe 3 mm pulmonary nodule, for which no prior comparison exists (this portion of the lungs was not included on the prior CT abdomen/pelvis study). There was new patchy sclerosis throughout the sacrum, probably due to post treatment change. There was new irregular curvilinear nondisplaced sacral insufficiency fractures in the bilateral sacral ala.  There was additional findings include aortic atherosclerosis, coronary atherosclerosis, sequela of remote apical left ventricular myocardial infarction, nonobstructing left nephrolithiasis, and small hiatal hernia.  Chest CT on 12/05/2016 revealed scattered right lung nodules measuring up to 6 mm (mean diameter) in the right upper lobe along the minor fissure.  Metastatic disease was not excluded.    CA125 has been followed:  82.5 on 04/28/2015, 22.0 on 06/02/2015, 18.2 on 07/14/2015, 11.7 on 10/21/2015, 16.4 on 01/21/2016, 14.2 on 04/25/2016, 13.6 on 08/15/2016, and 16.2 on 12/07/2016.  She was started on Neurontin for neuropathy.  Higher doses made her "fuzzy headed".  She has  urge incontinence.  She is scheduled for urodynamics testing on 09/08/2016.  Symptomatically, she is doing well. She has continued back pain. Exam is stable. Labs are unremarkable.   Plan: 1.  Labs today:  CBC with diff, CMP,  CA125. 2.  Review interval chest CT- multiple pulmonary nodules of unclear significance.  Nodules too small to biopsy.  Discuss follow-up chest CT in 3 months. 3.  Review interval orthopedic and neurosurgery evaluation. 4.  Continue port flushes every 6-8 weeks. 5.  Schedule interval non-contrast chest CT on 03/06/2017 to follow up on lung nodules.  6.  RTC in 3 months for MD assessment, labs (CBC with diff, CMP, CA125), and review of chest CT.   Honor Loh, NP  12/07/2016, 10:51 AM   I saw and evaluated the patient, participating in the key portions of the service and reviewing pertinent diagnostic studies and records.  I reviewed the nurse practitioner's note and agree with the findings and the plan.  The assessment and plan were discussed with the patient.  Additional diagnostic studies of chest CTare needed to follow-up on pulmonary nodules and would change the clinical management.  A few questions were asked by the patient and answered.   Lequita Asal, MD 12/07/2016

## 2016-12-07 NOTE — Progress Notes (Signed)
Patient here for follow up/results she injured her ankle in July and has a back procedure scheduled for next week. She continues to have neuropathy.

## 2016-12-08 LAB — CA 125: Cancer Antigen (CA) 125: 16.2 U/mL (ref 0.0–38.1)

## 2016-12-13 ENCOUNTER — Encounter: Payer: Self-pay | Admitting: Student in an Organized Health Care Education/Training Program

## 2016-12-13 ENCOUNTER — Ambulatory Visit (HOSPITAL_BASED_OUTPATIENT_CLINIC_OR_DEPARTMENT_OTHER): Payer: Medicare Other | Admitting: Student in an Organized Health Care Education/Training Program

## 2016-12-13 ENCOUNTER — Ambulatory Visit
Admission: RE | Admit: 2016-12-13 | Discharge: 2016-12-13 | Disposition: A | Discharge status: Home / Self Care | Payer: Medicare Other | Source: Ambulatory Visit | Attending: Student in an Organized Health Care Education/Training Program | Admitting: Student in an Organized Health Care Education/Training Program

## 2016-12-13 VITALS — BP 150/70 | HR 48 | Temp 97.5°F | Resp 12 | Ht 64.0 in | Wt 208.0 lb

## 2016-12-13 DIAGNOSIS — M47816 Spondylosis without myelopathy or radiculopathy, lumbar region: Secondary | ICD-10-CM | POA: Diagnosis not present

## 2016-12-13 DIAGNOSIS — C801 Malignant (primary) neoplasm, unspecified: Secondary | ICD-10-CM

## 2016-12-13 DIAGNOSIS — G63 Polyneuropathy in diseases classified elsewhere: Secondary | ICD-10-CM

## 2016-12-13 DIAGNOSIS — G629 Polyneuropathy, unspecified: Secondary | ICD-10-CM | POA: Insufficient documentation

## 2016-12-13 DIAGNOSIS — Z955 Presence of coronary angioplasty implant and graft: Secondary | ICD-10-CM | POA: Insufficient documentation

## 2016-12-13 DIAGNOSIS — S3210XA Unspecified fracture of sacrum, initial encounter for closed fracture: Secondary | ICD-10-CM | POA: Diagnosis not present

## 2016-12-13 DIAGNOSIS — X58XXXA Exposure to other specified factors, initial encounter: Secondary | ICD-10-CM | POA: Insufficient documentation

## 2016-12-13 DIAGNOSIS — M8448XA Pathological fracture, other site, initial encounter for fracture: Secondary | ICD-10-CM

## 2016-12-13 MED ORDER — LIDOCAINE HCL (PF) 1 % IJ SOLN
10.0000 mL | Freq: Once | INTRAMUSCULAR | Status: DC
  Filled 2016-12-13: qty 10

## 2016-12-13 MED ORDER — FENTANYL CITRATE (PF) 100 MCG/2ML IJ SOLN
25.0000 ug | INTRAMUSCULAR | Status: DC | PRN
  Administered 2016-12-13: 25 ug via INTRAVENOUS
  Filled 2016-12-13: qty 2

## 2016-12-13 MED ORDER — LACTATED RINGERS IV SOLN
1000.0000 mL | Freq: Once | INTRAVENOUS | Status: AC
  Administered 2016-12-13: 1000 mL via INTRAVENOUS

## 2016-12-13 MED ORDER — ROPIVACAINE HCL 2 MG/ML IJ SOLN
9.0000 mL | Freq: Once | INTRAMUSCULAR | Status: AC
  Administered 2016-12-13: 10 mL via PERINEURAL
  Filled 2016-12-13: qty 10

## 2016-12-13 NOTE — Progress Notes (Signed)
Safety precautions to be maintained throughout the outpatient stay will include: orient to surroundings, keep bed in low position, maintain call bell within reach at all times, provide assistance with transfer out of bed and ambulation.  

## 2016-12-13 NOTE — Progress Notes (Signed)
Patient's Name: Susan [name]  MRN: [id number]  Referring Provider: [name], MD  DOB: 1935-01-05  PCP: [name], MD  DOS: 12/13/2016  Note by: Gillis Santa, MD  Service setting: Ambulatory outpatient  Specialty: Interventional Pain Management  Patient type: Established  Location: [facility] (AMB) Pain Management Facility  Visit type: Interventional Procedure   Primary Reason for Visit: Interventional Pain Management Treatment. CC: Back Pain (lower)  Procedure:  Anesthesia, Analgesia, Anxiolysis:  Type: Diagnostic Medial Branch Facet Block Region: Lumbar Level:  L3, L4, L5,  Medial Branch Level(s) Laterality: Bilateral  Type: Local Anesthesia with Moderate (Conscious) Sedation Local Anesthetic: Lidocaine 1% Route: Intravenous (IV) IV Access: Secured Sedation: Meaningful verbal contact was maintained at all times during the procedure  Indication(s): Analgesia and Anxiety  Indications: 1. Spondylosis without myelopathy or radiculopathy, lumbar region   2. Sacral insufficiency fracture, initial encounter   3. Neuropathy associated with cancer (HCC)    Pain Score: Pre-procedure: 5 /10 Post-procedure: 0-No pain/10  Pre-op Assessment:  Susan [name] is a 82 y.o. (year old), female patient, seen today for interventional treatment. She  has a past surgical history that includes left heart catheterization with coronary angiogram (10/19/2012); percutaneous coronary stent intervention (pci-s) (10/19/2012); percutaneous coronary stent intervention (pci-s) (N/A, 10/23/2012); Abdominal hysterectomy; Cardiac catheterization (N/A, 05/05/2015); and Breast excisional biopsy (Right, 1956). Susan [name] has a current medication list which includes the following prescription(s): aspirin ec, atorvastatin, brilinta, ezetimibe, fluticasone, furosemide, gabapentin, isosorbide mononitrate, lidocaine-prilocaine, loperamide, losartan, multivitamin with minerals, nitrostat, omeprazole, pyridoxine, and vitamin  d (cholecalciferol), and the following Facility-Administered Medications: fentanyl, lidocaine (pf), sodium chloride flush, and sodium chloride flush. Her primarily concern today is the Back Pain (lower)  Initial Vital Signs: Blood pressure (!) 158/58, pulse (!) 48, temperature 98.3 F (36.8 C), temperature source Oral, resp. rate 14, height 5\' 4"  (1.626 m), weight 208 lb (94.3 kg), SpO2 100 %. BMI: Estimated body mass index is 35.7 kg/m as calculated from the following:   Height as of this encounter: 5\' 4"  (1.626 m).   Weight as of this encounter: 208 lb (94.3 kg).  Risk Assessment: Allergies: Reviewed. She is allergic to ace inhibitors and penicillins.  Allergy Precautions: None required Coagulopathies: Reviewed. None identified.  Blood-thinner therapy: None at this time Active Infection(s): Reviewed. None identified. Susan [name] is afebrile  Site Confirmation: Susan [name] was asked to confirm the procedure and laterality before marking the site Procedure checklist: Completed Consent: Before the procedure and under the influence of no sedative(s), amnesic(s), or anxiolytics, the patient was informed of the treatment options, risks and possible complications. To fulfill our ethical and legal obligations, as recommended by the American Medical Association's Code of Ethics, I have informed the patient of my clinical impression; the nature and purpose of the treatment or procedure; the risks, benefits, and possible complications of the intervention; the alternatives, including doing nothing; the risk(s) and benefit(s) of the alternative treatment(s) or procedure(s); and the risk(s) and benefit(s) of doing nothing. The patient was provided information about the general risks and possible complications associated with the procedure. These may include, but are not limited to: failure to achieve desired goals, infection, bleeding, organ or nerve damage, allergic reactions, paralysis, and death. In  addition, the patient was informed of those risks and complications associated to Spine-related procedures, such as failure to decrease pain; infection (i.e.: Meningitis, epidural or intraspinal abscess); bleeding (i.e.: epidural hematoma, subarachnoid hemorrhage, or any other type of intraspinal or peri-dural bleeding); organ or nerve damage (i.e.: Any  type of peripheral nerve, nerve root, or spinal cord injury) with subsequent damage to sensory, motor, and/or autonomic systems, resulting in permanent pain, numbness, and/or weakness of one or several areas of the body; allergic reactions; (i.e.: anaphylactic reaction); and/or death. Furthermore, the patient was informed of those risks and complications associated with the medications. These include, but are not limited to: allergic reactions (i.e.: anaphylactic or anaphylactoid reaction(s)); adrenal axis suppression; blood sugar elevation that in diabetics may result in ketoacidosis or comma; water retention that in patients with history of congestive heart failure may result in shortness of breath, pulmonary edema, and decompensation with resultant heart failure; weight gain; swelling or edema; medication-induced neural toxicity; particulate matter embolism and blood vessel occlusion with resultant organ, and/or nervous system infarction; and/or aseptic necrosis of one or more joints. Finally, the patient was informed that Medicine is not an exact science; therefore, there is also the possibility of unforeseen or unpredictable risks and/or possible complications that may result in a catastrophic outcome. The patient indicated having understood very clearly. We have given the patient no guarantees and we have made no promises. Enough time was given to the patient to ask questions, all of which were answered to the patient's satisfaction. Susan [name] has indicated that she wanted to continue with the procedure. Attestation: I, the ordering provider, attest that  I have discussed with the patient the benefits, risks, side-effects, alternatives, likelihood of achieving goals, and potential problems during recovery for the procedure that I have provided informed consent. Date: 12/13/2016; Time: 10:47 AM  Pre-Procedure Preparation:  Monitoring: As per clinic protocol. Respiration, ETCO2, SpO2, BP, heart rate and rhythm monitor placed and checked for adequate function Safety Precautions: Patient was assessed for positional comfort and pressure points before starting the procedure. Time-out: I initiated and conducted the "Time-out" before starting the procedure, as per protocol. The patient was asked to participate by confirming the accuracy of the "Time Out" information. Verification of the correct person, site, and procedure were performed and confirmed by me, the nursing staff, and the patient. "Time-out" conducted as per Joint Commission's Universal Protocol (UP.01.01.01). "Time-out" Date & Time: 12/13/2016; 1128 hrs.  Description of Procedure Process:   Position: Prone Target Area: For Lumbar Facet blocks, the target is the groove formed by the junction of the transverse process and superior articular process. For the L5 dorsal ramus, the target is the notch between superior articular process and sacral ala.  Approach: Paramedial approach. Area Prepped: Entire Posterior Lumbosacral Region Prepping solution: ChloraPrep (2% chlorhexidine gluconate and 70% isopropyl alcohol) Safety Precautions: Aspiration looking for blood return was conducted prior to all injections. At no point did we inject any substances, as a needle was being advanced. No attempts were made at seeking any paresthesias. Safe injection practices and needle disposal techniques used. Medications properly checked for expiration dates. SDV (single dose vial) medications used. Description of the Procedure: Protocol guidelines were followed. The patient was placed in position over the fluoroscopy  table. The target area was identified and the area prepped in the usual manner. Skin desensitized using vapocoolant spray. Skin & deeper tissues infiltrated with local anesthetic. Appropriate amount of time allowed to pass for local anesthetics to take effect. The procedure needle was introduced through the skin, ipsilateral to the reported pain, and advanced to the target area. Employing the "Medial Branch Technique", the needles were advanced to the angle made by the superior and medial portion of the transverse process, and the lateral and inferior portion of  the superior articulating process of the targeted vertebral bodies. This area is known as "Burton's Eye" or the "Eye of the [place] Dog". A procedure needle was introduced through the skin, and this time advanced to the angle made by the superior and medial border of the sacral ala, and the lateral border of the S1 vertebral body. Negative aspiration confirmed. Solution injected in intermittent fashion, asking for systemic symptoms every 0.5cc of injectate. The needles were then removed and the area cleansed, making sure to leave some of the prepping solution back to take advantage of its long term bactericidal properties.   Illustration of the posterior view of the lumbar spine and the posterior neural structures. Laminae of L2 through S1 are labeled. DPRL5, dorsal primary ramus of L5; DPRS1, dorsal primary ramus of S1; DPR3, dorsal primary ramus of L3; FJ, facet (zygapophyseal) joint L3-L4; I, inferior articular process of L4; LB1, lateral branch of dorsal primary ramus of L1; IAB, inferior articular branches from L3 medial branch (supplies L4-L5 facet joint); IBP, intermediate branch plexus; MB3, medial branch of dorsal primary ramus of L3; NR3, third lumbar nerve root; S, superior articular process of L5; SAB, superior articular branches from L4 (supplies L4-5 facet joint also); TP3, transverse process of L3.  Vitals:   12/13/16 1147 12/13/16 1155  12/13/16 1207 12/13/16 1217  BP: (!) 164/66 (!) 168/67 (!) 159/70 (!) 150/70  Pulse:      Resp: 12 16 16 12   Temp:  98 F (36.7 C)  (!) 97.5 F (36.4 C)  TempSrc:      SpO2: 99% 96% 97% 99%  Weight:      Height:        Start Time: 1128 hrs. End Time: 1146 hrs. Materials:  Needle(s) Type: Regular needle Gauge: 22G Length: 3.5-in Medication(s): We administered lactated ringers, fentaNYL, and ropivacaine (PF) 2 mg/mL (0.2%). Please see chart orders for dosing details.  Imaging Guidance (Spinal):  Type of Imaging Technique: Fluoroscopy Guidance (Spinal) Indication(s): Assistance in needle guidance and placement for procedures requiring needle placement in or near specific anatomical locations not easily accessible without such assistance. Exposure Time: Please see nurses notes. Contrast: None used. Fluoroscopic Guidance: I was personally present during the use of fluoroscopy. "Tunnel Vision Technique" used to obtain the best possible view of the target area. Parallax error corrected before commencing the procedure. "Direction-depth-direction" technique used to introduce the needle under continuous pulsed fluoroscopy. Once target was reached, antero-posterior, oblique, and lateral fluoroscopic projection used confirm needle placement in all planes. Images permanently stored in EMR. Interpretation: No contrast injected. I personally interpreted the imaging intraoperatively. Adequate needle placement confirmed in multiple planes. Permanent images saved into the patient's record.  Antibiotic Prophylaxis:  Indication(s): None identified Antibiotic given: None  Post-operative Assessment:  EBL: None Complications: No immediate post-treatment complications observed by team, or reported by patient. Note: The patient tolerated the entire procedure well. A repeat set of vitals were taken after the procedure and the patient was kept under observation following institutional policy, for this type  of procedure. Post-procedural neurological assessment was performed, showing return to baseline, prior to discharge. The patient was provided with post-procedure discharge instructions, including a section on how to identify potential problems. Should any problems arise concerning this procedure, the patient was given instructions to immediately contact us, at any time, without hesitation. In any case, we plan to contact the patient by telephone for a follow-up status report regarding this interventional procedure. Comments:  No additional relevant information.  Plan of Care  Disposition: Discharge home  Discharge Date & Time: 12/13/2016; 1220 hrs.  5/5 strength b/l LE upon discharge  Physician-requested Follow-up:  Return in about 2 weeks (around 12/27/2016) for Post Procedure Evaluation.  Future Appointments Date Time Provider [place]  12/19/2016 3:00 PM [facility] None  12/26/2016 11:30 AM Gillis Santa, MD [facility] None  12/27/2016 9:20 AM Troy Sine, MD [facility]  01/30/2017 2:30 PM [facility] None  02/21/2017 11:00 AM [facility] None  03/06/2017 2:00 PM [facility]  03/08/2017 2:30 PM [facility] None  03/08/2017 2:45 PM Lequita Asal, MD [facility] None  03/13/2017 2:30 PM [facility] None  11/07/2017 2:00 PM Chrystal, Eulas Post, MD [facility] None    Imaging Orders     DG C-Arm 1-60 Min-No Report Procedure Orders    No procedure(s) ordered today    Medications ordered for procedure: Meds ordered this encounter  Medications  . lactated ringers infusion 1,000 mL  . fentaNYL (SUBLIMAZE) injection 25-50 mcg    Make sure Narcan is available in the pyxis when using this medication. In the event of respiratory depression (RR< 8/min): Titrate NARCAN (naloxone) in increments of 0.1 to 0.2 mg IV at 2-3 minute intervals, until desired degree of reversal.  . ropivacaine (PF) 2 mg/mL  (0.2%) (NAROPIN) injection 9 mL  . lidocaine (PF) (XYLOCAINE) 1 % injection 10 mL   Medications administered: We administered lactated ringers, fentaNYL, and ropivacaine (PF) 2 mg/mL (0.2%).  See the medical record for exact dosing, route, and time of administration.  New Prescriptions   No medications on file   Primary Care Physician: [name], MD Location: [facility] Note by: Gillis Santa, MD Date: 12/13/2016; Time: 4:24 PM  Disclaimer:  Medicine is not an exact science. The only guarantee in medicine is that nothing is guaranteed. It is important to note that the decision to proceed with this intervention was based on the information collected from the patient. The Data and conclusions were drawn from the patient's questionnaire, the interview, and the physical examination. Because the information was provided in large part by the patient, it cannot be guaranteed that it has not been purposely or unconsciously manipulated. Every effort has been made to obtain as much relevant data as possible for this evaluation. It is important to note that the conclusions that lead to this procedure are derived in large part from the available data. Always take into account that the treatment will also be dependent on availability of resources and existing treatment guidelines, considered by other Pain Management Practitioners as being common knowledge and practice, at the time of the intervention. For Medico-Legal purposes, it is also important to point out that variation in procedural techniques and pharmacological choices are the acceptable norm. The indications, contraindications, technique, and results of the above procedure should only be interpreted and judged by a Board-Certified Interventional Pain Specialist with extensive familiarity and expertise in the same exact procedure and technique.

## 2016-12-13 NOTE — Patient Instructions (Signed)
Facet Blocks Patient Information  Description: The facets are joints in the spine between the vertebrae.  Like any joints in the body, facets can become irritated and painful.  Arthritis can also effect the facets.  By injecting steroids and local anesthetic in and around these joints, we can temporarily block the nerve supply to them.  Steroids act directly on irritated nerves and tissues to reduce selling and inflammation which often leads to decreased pain.  Facet blocks may be done anywhere along the spine from the neck to the low back depending upon the location of your pain.   After numbing the skin with local anesthetic (like Novocaine), a small needle is passed onto the facet joints under x-ray guidance.  You may experience a sensation of pressure while this is being done.  The entire block usually lasts about 15-25 minutes.   Conditions which may be treated by facet blocks:   Low back/buttock pain  Neck/shoulder pain  Certain types of headaches  Preparation for the injection:  1. Do not eat any solid food or dairy products within 8 hours of your appointment. 2. You may drink clear liquid up to 3 hours before appointment.  Clear liquids include water, black coffee, juice or soda.  No milk or cream please. 3. You may take your regular medication, including pain medications, with a sip of water before your appointment.  Diabetics should hold regular insulin (if taken separately) and take 1/2 normal NPH dose the morning of the procedure.  Carry some sugar containing items with you to your appointment. 4. A driver must accompany you and be prepared to drive you home after your procedure. 5. Bring all your current medications with you. 6. An IV may be inserted and sedation may be given at the discretion of the physician. 7. A blood pressure cuff, EKG and other monitors will often be applied during the procedure.  Some patients may need to have extra oxygen administered for a short  period. 8. You will be asked to provide medical information, including your allergies and medications, prior to the procedure.  We must know immediately if you are taking blood thinners (like Coumadin/Warfarin) or if you are allergic to IV iodine contrast (dye).  We must know if you could possible be pregnant.  Possible side-effects:   Bleeding from needle site  Infection (rare, may require surgery)  Nerve injury (rare)  Numbness & tingling (temporary)  Difficulty urinating (rare, temporary)  Spinal headache (a headache worse with upright posture)  Light-headedness (temporary)  Pain at injection site (serveral days)  Decreased blood pressure (rare, temporary)  Weakness in arm/leg (temporary)  Pressure sensation in back/neck (temporary)   Call if you experience:   Fever/chills associated with headache or increased back/neck pain  Headache worsened by an upright position  New onset, weakness or numbness of an extremity below the injection site  Hives or difficulty breathing (go to the emergency room)  Inflammation or drainage at the injection site(s)  Severe back/neck pain greater than usual  New symptoms which are concerning to you  Please note:  Although the local anesthetic injected can often make your back or neck feel good for several hours after the injection, the pain will likely return. It takes 3-7 days for steroids to work.  You may not notice any pain relief for at least one week.  If effective, we will often do a series of 2-3 injections spaced 3-6 weeks apart to maximally decrease your pain.  After the initial   series, you may be a candidate for a more permanent nerve block of the facets.  If you have any questions, please call [phone] [facility] AND COM[facility]S  What are the risk, side effects and possible complications? Generally speaking, most procedures are safe.  However, with any procedure  there are risks, side effects, and the possibility of complications.  The risks and complications are dependent upon the sites that are lesioned, or the type of nerve block to be performed.  The closer the procedure is to the spine, the more serious the risks are.  Great care is taken when placing the radio frequency needles, block needles or lesioning probes, but sometimes complications can occur. 1. Infection: Any time there is an injection through the skin, there is a risk of infection.  This is why sterile conditions are used for these blocks.  There are four possible types of infection. 1. Localized skin infection. 2. Central Nervous System Infection-This can be in the form of Meningitis, which can be deadly. 3. Epidural Infections-This can be in the form of an epidural abscess, which can cause pressure inside of the spine, causing compression of the spinal cord with subsequent paralysis. This would require an emergency surgery to decompress, and there are no guarantees that the patient would recover from the paralysis. 4. Discitis-This is an infection of the intervertebral discs.  It occurs in about 1% of discography procedures.  It is difficult to treat and it may lead to surgery.        2. Pain: the needles have to go through skin and soft tissues, will cause soreness.       3. Damage to internal structures:  The nerves to be lesioned may be near blood vessels or    other nerves which can be potentially damaged.       4. Bleeding: Bleeding is more common if the patient is taking blood thinners such as  aspirin, Coumadin, Ticiid, Plavix, etc., or if he/she have some genetic predisposition  such as hemophilia. Bleeding into the spinal canal can cause compression of the spinal  cord with subsequent paralysis.  This would require an emergency surgery to  decompress and there are no guarantees that the patient would recover from the  paralysis.       5. Pneumothorax:  Puncturing of a lung is a  possibility, every time a needle is introduced in  the area of the chest or upper back.  Pneumothorax refers to free air around the  collapsed lung(s), inside of the thoracic cavity (chest cavity).  Another two possible  complications related to a similar event would include: Hemothorax and Chylothorax.   These are variations of the Pneumothorax, where instead of air around the collapsed  lung(s), you may have blood or chyle, respectively.       6. Spinal headaches: They may occur with any procedures in the area of the spine.       7. Persistent CSF (Cerebro-Spinal Fluid) leakage: This is a rare problem, but may occur  with prolonged intrathecal or epidural catheters either due to the formation of a fistulous  track or a dural tear.       8. Nerve damage: By working so close to the spinal cord, there is always a possibility of  nerve damage, which could be as serious as a permanent spinal cord injury with  paralysis.       9. Death:  Although rare, severe deadly allergic   reactions known as "Anaphylactic  reaction" can occur to any of the medications used.      10. Worsening of the symptoms:  We can always make thing worse.  What are the chances of something like this happening? Chances of any of this occuring are extremely low.  By statistics, you have more of a chance of getting killed in a motor vehicle accident: while driving to the hospital than any of the above occurring .  Nevertheless, you should be aware that they are possibilities.  In general, it is similar to taking a shower.  Everybody knows that you can slip, hit your head and get killed.  Does that mean that you should not shower again?  Nevertheless always keep in mind that statistics do not mean anything if you happen to be on the wrong side of them.  Even if a procedure has a 1 (one) in a 1,000,000 (million) chance of going wrong, it you happen to be that one..Also, keep in mind that by statistics, you have more of a chance of having  something go wrong when taking medications.  Who should not have this procedure? If you are on a blood thinning medication (e.g. Coumadin, Plavix, see list of "Blood Thinners"), or if you have an active infection going on, you should not have the procedure.  If you are taking any blood thinners, please inform your physician.  How should I prepare for this procedure?  Do not eat or drink anything at least six hours prior to the procedure.  Bring a driver with you .  It cannot be a taxi.  Come accompanied by an adult that can drive you back, and that is strong enough to help you if your legs get weak or numb from the local anesthetic.  Take all of your medicines the morning of the procedure with just enough water to swallow them.  If you have diabetes, make sure that you are scheduled to have your procedure done first thing in the morning, whenever possible.  If you have diabetes, take only half of your insulin dose and notify our nurse that you have done so as soon as you arrive at the clinic.  If you are diabetic, but only take blood sugar pills (oral hypoglycemic), then do not take them on the morning of your procedure.  You may take them after you have had the procedure.  Do not take aspirin or any aspirin-containing medications, at least eleven (11) days prior to the procedure.  They may prolong bleeding.  Wear loose fitting clothing that may be easy to take off and that you would not mind if it got stained with Betadine or blood.  Do not wear any jewelry or perfume  Remove any nail coloring.  It will interfere with some of our monitoring equipment.  NOTE: Remember that this is not meant to be interpreted as a complete list of all possible complications.  Unforeseen problems may occur.  BLOOD THINNERS The following drugs contain aspirin or other products, which can cause increased bleeding during surgery and should not be taken for 2 weeks prior to and 1 week after surgery.  If you  should need take something for relief of minor pain, you may take acetaminophen which is found in Tylenol,m Datril, Anacin-3 and Panadol. It is not blood thinner. The products listed below are.  Do not take any of the products listed below in addition to any listed on your instruction sheet.  A.P.C or A.P.C with   Codeine Codeine Phosphate Capsules #3 Ibuprofen Ridaura  ABC compound Congesprin Imuran rimadil  Advil Cope Indocin Robaxisal  Alka-Seltzer Effervescent Pain Reliever and Antacid Coricidin or Coricidin-D  Indomethacin Rufen  Alka-Seltzer plus Cold Medicine Cosprin Ketoprofen S-A-C Tablets  Anacin Analgesic Tablets or Capsules Coumadin Korlgesic Salflex  Anacin Extra Strength Analgesic tablets or capsules CP-2 Tablets Lanoril Salicylate  Anaprox Cuprimine Capsules Levenox Salocol  Anexsia-D Dalteparin Magan Salsalate  Anodynos Darvon compound Magnesium Salicylate Sine-off  Ansaid Dasin Capsules Magsal Sodium Salicylate  Anturane Depen Capsules Marnal Soma  APF Arthritis pain formula Dewitt's Pills Measurin Stanback  Argesic Dia-Gesic Meclofenamic Sulfinpyrazone  Arthritis Bayer Timed Release Aspirin Diclofenac Meclomen Sulindac  Arthritis pain formula Anacin Dicumarol Medipren Supac  Analgesic (Safety coated) Arthralgen Diffunasal Mefanamic Suprofen  Arthritis Strength Bufferin Dihydrocodeine Mepro Compound Suprol  Arthropan liquid Dopirydamole Methcarbomol with Aspirin Synalgos  ASA tablets/Enseals Disalcid Micrainin Tagament  Ascriptin Doan's Midol Talwin  Ascriptin A/D Dolene Mobidin Tanderil  Ascriptin Extra Strength Dolobid Moblgesic Ticlid  Ascriptin with Codeine Doloprin or Doloprin with Codeine Momentum Tolectin  Asperbuf Duoprin Mono-gesic Trendar  Aspergum Duradyne Motrin or Motrin IB Triminicin  Aspirin plain, buffered or enteric coated Durasal Myochrisine Trigesic  Aspirin Suppositories Easprin Nalfon Trillsate  Aspirin with Codeine Ecotrin Regular or Extra Strength  Naprosyn Uracel  Atromid-S Efficin Naproxen Ursinus  Auranofin Capsules Elmiron Neocylate Vanquish  Axotal Emagrin Norgesic Verin  Azathioprine Empirin or Empirin with Codeine Normiflo Vitamin E  Azolid Emprazil Nuprin Voltaren  Bayer Aspirin plain, buffered or children's or timed BC Tablets or powders Encaprin Orgaran Warfarin Sodium  Buff-a-Comp Enoxaparin Orudis Zorpin  Buff-a-Comp with Codeine Equegesic Os-Cal-Gesic   Buffaprin Excedrin plain, buffered or Extra Strength Oxalid   Bufferin Arthritis Strength Feldene Oxphenbutazone   Bufferin plain or Extra Strength Feldene Capsules Oxycodone with Aspirin   Bufferin with Codeine Fenoprofen Fenoprofen Pabalate or Pabalate-SF   Buffets II Flogesic Panagesic   Buffinol plain or Extra Strength Florinal or Florinal with Codeine Panwarfarin   Buf-Tabs Flurbiprofen Penicillamine   Butalbital Compound Four-way cold tablets Penicillin   Butazolidin Fragmin Pepto-Bismol   Carbenicillin Geminisyn Percodan   Carna Arthritis Reliever Geopen Persantine   Carprofen Gold's salt Persistin   Chloramphenicol Goody's Phenylbutazone   Chloromycetin Haltrain Piroxlcam   Clmetidine heparin Plaquenil   Cllnoril Hyco-pap Ponstel   Clofibrate Hydroxy chloroquine Propoxyphen         Before stopping any of these medications, be sure to consult the physician who ordered them.  Some, such as Coumadin (Warfarin) are ordered to prevent or treat serious conditions such as "deep thrombosis", "pumonary embolisms", and other heart problems.  The amount of time that you may need off of the medication may also vary with the medication and the reason for which you were taking it.  If you are taking any of these medications, please make sure you notify your pain physician before you undergo any procedures.         Pain Management Discharge Instructions  General Discharge Instructions :  If you need to reach your doctor call: Monday-Friday 8:00 am - 4:00 pm at  [phone] or toll free [phone].  After clinic hours [phone] to have operator reach doctor.  Bring all of your medication bottles to all your appointments in the pain clinic.  To cancel or reschedule your appointment with Pain Management please remember to call 24 hours in advance to avoid a fee.  Refer to the educational materials which you have been given on: General Risks, I  had my Procedure. Discharge Instructions, Post Sedation.  Post Procedure Instructions:  The drugs you were given will stay in your system until tomorrow, so for the next 24 hours you should not drive, make any legal decisions or drink any alcoholic beverages.  You may eat anything you prefer, but it is better to start with liquids then soups and crackers, and gradually work up to solid foods.  Please notify your doctor immediately if you have any unusual bleeding, trouble breathing or pain that is not related to your normal pain.  Depending on the type of procedure that was done, some parts of your body may feel week and/or numb.  This usually clears up by tonight or the next day.  Walk with the use of an assistive device or accompanied by an adult for the 24 hours.  You may use ice on the affected area for the first 24 hours.  Put ice in a Ziploc bag and cover with a towel and place against area 15 minutes on 15 minutes off.  You may switch to heat after 24 hours.

## 2016-12-19 ENCOUNTER — Inpatient Hospital Stay: Payer: Medicare Other

## 2016-12-19 DIAGNOSIS — Z8542 Personal history of malignant neoplasm of other parts of uterus: Secondary | ICD-10-CM | POA: Diagnosis not present

## 2016-12-19 DIAGNOSIS — Z95828 Presence of other vascular implants and grafts: Secondary | ICD-10-CM

## 2016-12-19 MED ORDER — SODIUM CHLORIDE 0.9% FLUSH
10.0000 mL | INTRAVENOUS | Status: AC | PRN
  Administered 2016-12-19: 10 mL
  Filled 2016-12-19: qty 10

## 2016-12-19 MED ORDER — HEPARIN SOD (PORK) LOCK FLUSH 100 UNIT/ML IV SOLN
500.0000 [IU] | INTRAVENOUS | Status: AC | PRN
  Administered 2016-12-19: 500 [IU]

## 2016-12-26 ENCOUNTER — Encounter: Payer: Self-pay | Admitting: Student in an Organized Health Care Education/Training Program

## 2016-12-26 ENCOUNTER — Ambulatory Visit
Payer: Medicare Other | Attending: Student in an Organized Health Care Education/Training Program | Admitting: Student in an Organized Health Care Education/Training Program

## 2016-12-26 VITALS — BP 111/52 | HR 51 | Temp 98.0°F | Resp 18 | Ht 64.0 in | Wt 208.0 lb

## 2016-12-26 DIAGNOSIS — Z880 Allergy status to penicillin: Secondary | ICD-10-CM | POA: Diagnosis not present

## 2016-12-26 DIAGNOSIS — Z87891 Personal history of nicotine dependence: Secondary | ICD-10-CM | POA: Diagnosis not present

## 2016-12-26 DIAGNOSIS — I472 Ventricular tachycardia: Secondary | ICD-10-CM | POA: Diagnosis not present

## 2016-12-26 DIAGNOSIS — Z803 Family history of malignant neoplasm of breast: Secondary | ICD-10-CM | POA: Insufficient documentation

## 2016-12-26 DIAGNOSIS — R319 Hematuria, unspecified: Secondary | ICD-10-CM | POA: Diagnosis not present

## 2016-12-26 DIAGNOSIS — Z9221 Personal history of antineoplastic chemotherapy: Secondary | ICD-10-CM | POA: Insufficient documentation

## 2016-12-26 DIAGNOSIS — Z90710 Acquired absence of both cervix and uterus: Secondary | ICD-10-CM | POA: Insufficient documentation

## 2016-12-26 DIAGNOSIS — I252 Old myocardial infarction: Secondary | ICD-10-CM | POA: Diagnosis not present

## 2016-12-26 DIAGNOSIS — G63 Polyneuropathy in diseases classified elsewhere: Secondary | ICD-10-CM | POA: Diagnosis not present

## 2016-12-26 DIAGNOSIS — Z9889 Other specified postprocedural states: Secondary | ICD-10-CM | POA: Diagnosis not present

## 2016-12-26 DIAGNOSIS — R001 Bradycardia, unspecified: Secondary | ICD-10-CM | POA: Diagnosis not present

## 2016-12-26 DIAGNOSIS — Z85828 Personal history of other malignant neoplasm of skin: Secondary | ICD-10-CM | POA: Diagnosis not present

## 2016-12-26 DIAGNOSIS — M47816 Spondylosis without myelopathy or radiculopathy, lumbar region: Secondary | ICD-10-CM

## 2016-12-26 DIAGNOSIS — I25119 Atherosclerotic heart disease of native coronary artery with unspecified angina pectoris: Secondary | ICD-10-CM | POA: Insufficient documentation

## 2016-12-26 DIAGNOSIS — Z833 Family history of diabetes mellitus: Secondary | ICD-10-CM | POA: Insufficient documentation

## 2016-12-26 DIAGNOSIS — R918 Other nonspecific abnormal finding of lung field: Secondary | ICD-10-CM | POA: Insufficient documentation

## 2016-12-26 DIAGNOSIS — G629 Polyneuropathy, unspecified: Secondary | ICD-10-CM | POA: Diagnosis not present

## 2016-12-26 DIAGNOSIS — M48061 Spinal stenosis, lumbar region without neurogenic claudication: Secondary | ICD-10-CM | POA: Insufficient documentation

## 2016-12-26 DIAGNOSIS — K219 Gastro-esophageal reflux disease without esophagitis: Secondary | ICD-10-CM | POA: Diagnosis not present

## 2016-12-26 DIAGNOSIS — Z923 Personal history of irradiation: Secondary | ICD-10-CM | POA: Insufficient documentation

## 2016-12-26 DIAGNOSIS — M25532 Pain in left wrist: Secondary | ICD-10-CM | POA: Insufficient documentation

## 2016-12-26 DIAGNOSIS — Z87442 Personal history of urinary calculi: Secondary | ICD-10-CM | POA: Insufficient documentation

## 2016-12-26 DIAGNOSIS — Z955 Presence of coronary angioplasty implant and graft: Secondary | ICD-10-CM | POA: Insufficient documentation

## 2016-12-26 DIAGNOSIS — C801 Malignant (primary) neoplasm, unspecified: Secondary | ICD-10-CM | POA: Diagnosis not present

## 2016-12-26 DIAGNOSIS — M8448XA Pathological fracture, other site, initial encounter for fracture: Secondary | ICD-10-CM | POA: Diagnosis not present

## 2016-12-26 DIAGNOSIS — E785 Hyperlipidemia, unspecified: Secondary | ICD-10-CM | POA: Insufficient documentation

## 2016-12-26 DIAGNOSIS — I2102 ST elevation (STEMI) myocardial infarction involving left anterior descending coronary artery: Secondary | ICD-10-CM | POA: Diagnosis not present

## 2016-12-26 DIAGNOSIS — M545 Low back pain: Secondary | ICD-10-CM | POA: Insufficient documentation

## 2016-12-26 DIAGNOSIS — Z823 Family history of stroke: Secondary | ICD-10-CM | POA: Insufficient documentation

## 2016-12-26 DIAGNOSIS — I10 Essential (primary) hypertension: Secondary | ICD-10-CM | POA: Diagnosis not present

## 2016-12-26 DIAGNOSIS — Z8249 Family history of ischemic heart disease and other diseases of the circulatory system: Secondary | ICD-10-CM | POA: Insufficient documentation

## 2016-12-26 NOTE — Patient Instructions (Addendum)
We will schedule you for lumbar facet block #2. This will be with sedation. Please be sure to stop your Brilinta 5 days prior to your procedure like you did last time.   Do noto eat or drink for 8 hours.  Bring a driver.  Stop blood thinner for 5 days prior to procedure.       Preparing for Procedure with Sedation Instructions: . Oral Intake: Do not eat or drink anything for at least 8 hours prior to your procedure. . Transportation: Public transportation is not allowed. Bring an adult driver. The driver must be physically present in our waiting room before any procedure can be started. [name] Physical Assistance: Bring an adult capable of physically assisting you, in the event you need help. . Blood Pressure Medicine: Take your blood pressure medicine with a sip of water the morning of the procedure. . Insulin: Take only  of your normal insulin dose. . Preventing infections: Shower with an antibacterial soap the morning of your procedure. . Build-up your immune system: Take 1000 mg of Vitamin C with every meal (3 times a day) the day prior to your procedure. . Pregnancy: If you are pregnant, call and cancel the procedure. . Sickness: If you have a cold, fever, or any active infections, call and cancel the procedure. . Arrival: You must be in the facility at least 30 minutes prior to your scheduled procedure. . Children: Do not bring children with you. . Dress appropriately: Bring dark clothing that you would not mind if they get stained. . Valuables: Do not bring any jewelry or valuables. Procedure appointments are reserved for interventional treatments only. [name] No Prescription Refills. . No medication changes will be discussed during procedure appointments. No disability issues will be discussed.Facet Blocks Patient Information  Description: The facets are joints in the spine between the vertebrae.  Like any joints in the body, facets can become irritated and painful.  Arthritis can also  effect the facets.  By injecting steroids and local anesthetic in and around these joints, we can temporarily block the nerve supply to them.  Steroids act directly on irritated nerves and tissues to reduce selling and inflammation which often leads to decreased pain.  Facet blocks may be done anywhere along the spine from the neck to the low back depending upon the location of your pain.   After numbing the skin with local anesthetic (like Novocaine), a small needle is passed onto the facet joints under x-ray guidance.  You may experience a sensation of pressure while this is being done.  The entire block usually lasts about 15-25 minutes.   Conditions which may be treated by facet blocks:  Low back/buttock pain Neck/shoulder pain Certain types of headaches  Preparation for the injection:  Do not eat any solid food or dairy products within 8 hours of your appointment. You may drink clear liquid up to 3 hours before appointment.  Clear liquids include water, black coffee, juice or soda.  No milk or cream please. You may take your regular medication, including pain medications, with a sip of water before your appointment.  Diabetics should hold regular insulin (if taken separately) and take 1/2 normal NPH dose the morning of the procedure.  Carry some sugar containing items with you to your appointment. A driver must accompany you and be prepared to drive you home after your procedure. Bring all your current medications with you. An IV may be inserted and sedation may be given at the discretion of the  physician. A blood pressure cuff, EKG and other monitors will often be applied during the procedure.  Some patients may need to have extra oxygen administered for a short period. You will be asked to provide medical information, including your allergies and medications, prior to the procedure.  We must know immediately if you are taking blood thinners (like Coumadin/Warfarin) or if you are allergic to  IV iodine contrast (dye).  We must know if you could possible be pregnant.  Possible side-effects:  Bleeding from needle site Infection (rare, may require surgery) Nerve injury (rare) Numbness & tingling (temporary) Difficulty urinating (rare, temporary) Spinal headache (a headache worse with upright posture) Light-headedness (temporary) Pain at injection site (serveral days) Decreased blood pressure (rare, temporary) Weakness in arm/leg (temporary) Pressure sensation in back/neck (temporary)   Call if you experience:  Fever/chills associated with headache or increased back/neck pain Headache worsened by an upright position New onset, weakness or numbness of an extremity below the injection site Hives or difficulty breathing (go to the emergency room) Inflammation or drainage at the injection site(s) Severe back/neck pain greater than usual New symptoms which are concerning to you  Please note:  Although the local anesthetic injected can often make your back or neck feel good for several hours after the injection, the pain will likely return. It takes 3-7 days for steroids to work.  You may not notice any pain relief for at least one week.  If effective, we will often do a series of 2-3 injections spaced 3-6 weeks apart to maximally decrease your pain.  After the initial series, you may be a candidate for a more permanent nerve block of the facets.  If you have any questions, please call #[phone] . [facility]

## 2016-12-26 NOTE — Progress Notes (Signed)
Safety precautions to be maintained throughout the outpatient stay will include: orient to surroundings, keep bed in low position, maintain call bell within reach at all times, provide assistance with transfer out of bed and ambulation.  

## 2016-12-26 NOTE — Progress Notes (Signed)
atient's Name: Susan [name]  MRN: [id number]  Referring Provider: [name], MD  DOB: 03/10/1935  PCP: [name], MD  DOS: 12/26/2016  Note by: Gillis Santa, MD  Service setting: Ambulatory outpatient  Specialty: Interventional Pain Management  Location: [facility] (AMB) Pain Management Facility    Patient type: Established   Primary Reason(s) for Visit: Encounter for post-procedure evaluation of chronic illness with mild to moderate exacerbation CC: Back Pain (low, left) and Wrist Pain (left)  HPI  Susan [name] is a 82 y.o. year old, female patient, who comes today for a post-procedure evaluation. She has STEMI 10/19/12- LAD DES, and CFX DES 10/24/12; HTN (hypertension); Cardiomyopathy, ischemic: EF 35-40% by echo post-MI 10/20/12; CAD (coronary artery disease)- elective CFX DES 10/24/12 for recurrent angina; Dyslipidemia; NSVT (nonsustained ventricular tachycardia) ([place]); Bradycardia- tolerating low dose beta blocker; Hematuria; Kidney stones; Endometrial cancer ([place]); CA skin, basal cell; Narrowing of intervertebral disc space; Accumulation of fluid in tissues; Benign essential HTN; Gastro-esophageal reflux disease without esophagitis; Hyperlipidemia; Calculus of kidney; Lumbar canal stenosis; Osteopenia; Arthritis, degenerative; Carcinoma of endometrium ([place]); Postoperative state; Neuralgia neuritis, sciatic nerve; Edema; Heart murmur; Absence of bladder continence; Neuropathy associated with cancer ([place]); History of endometrial cancer; Sacral insufficiency fracture; and Pulmonary nodules on her problem list. Her primarily concern today is the Back Pain (low, left) and Wrist Pain (left)  Pain Assessment: Location: Lower, Left Back Radiating: radiates down left leg to knee Onset: More than a month ago Duration: Chronic pain Quality: Cramping, Stabbing Severity: 3 /10 (self-reported pain score)  Note: Reported level is compatible with observation.                   When using our  objective Pain Scale, levels between 6 and 10/10 are said to belong in an emergency room, as it progressively worsens from a 6/10, described as severely limiting, requiring emergency care not usually available at an outpatient pain management facility. At a 6/10 level, communication becomes difficult and requires great effort. Assistance to reach the emergency department may be required. Facial flushing and profuse sweating along with potentially dangerous increases in heart rate and blood pressure will be evident. Effect on ADL:   Timing: Constant Modifying factors:    [name] comes in today for post-procedure evaluation after the treatment done on 12/13/2016.  Further details on both, my assessment(s), as well as the proposed treatment plan, please see below.  Post-Procedure Assessment  12/13/2016 Procedure: Type: Diagnostic Medial Branch Facet Block  L3, L4, L5,  Medial Branch Level(s) Laterality: Bilateral Pre-procedure pain score:  5/10 Post-procedure pain score: 0/10         Influential Factors: BMI: 35.70 kg/m Intra-procedural challenges: None observed.         Assessment challenges: None detected.              Reported side-effects: None.        Post-procedural adverse reactions or complications: None reported         Sedation: Please see nurses note. When no sedatives are used, the analgesic levels obtained are directly associated to the effectiveness of the local anesthetics. However, when sedation is provided, the level of analgesia obtained during the initial 1 hour following the intervention, is believed to be the result of a combination of factors. These factors may include, but are not limited to: 1. The effectiveness of the local anesthetics used. 2. The effects of the analgesic(s) and/or anxiolytic(s) used. 3. The degree of discomfort experienced by the  patient at the time of the procedure. 4. The patients ability and reliability in recalling and recording the events. 5.  The presence and influence of possible secondary gains and/or psychosocial factors. Reported result: Relief experienced during the 1st hour after the procedure: 100 % (Ultra-Short Term Relief)            Interpretative annotation: Clinically appropriate result. Analgesia during this period is likely to be Local Anesthetic and/or IV Sedative (Analgesic/Anxiolytic) related.          Effects of local anesthetic: The analgesic effects attained during this period are directly associated to the localized infiltration of local anesthetics and therefore cary significant diagnostic value as to the etiological location, or anatomical origin, of the pain. Expected duration of relief is directly dependent on the pharmacodynamics of the local anesthetic used. Long-acting (4-6 hours) anesthetics used.  Reported result: Relief during the next 4 to 6 hour after the procedure: 50 % (Short-Term Relief)            Interpretative annotation: Clinically appropriate result. Analgesia during this period is likely to be Local Anesthetic-related.          Long-term benefit: Defined as the period of time past the expected duration of local anesthetics (1 hour for short-acting and 4-6 hours for long-acting). With the possible exception of prolonged sympathetic blockade from the local anesthetics, benefits during this period are typically attributed to, or associated with, other factors such as analgesic sensory neuropraxia, antiinflammatory effects, or beneficial biochemical changes provided by agents other than the local anesthetics.  Reported result: Extended relief following procedure:  (75% better on right side 0% on left side. ) (Long-Term Relief)            Interpretative annotation: Clinically appropriate result. Good relief. No permanent benefit expected. Inflammation plays a part in the etiology to the pain.          Current benefits: Defined as reported results that persistent at this point in time.   Analgesia: 50-75 %  [name] reports improvement of axial symptoms. Function: Somewhat improved ROM: Somewhat improved Interpretative annotation: Recurrence of symptoms. No permanent benefit expected. Effective diagnostic intervention.          Interpretation: Results would suggest a successful diagnostic intervention. We'll proceed with diagnostic intervention #2, as soon as convenient          Plan:  Please see "Plan of Care" for details.       Laboratory Chemistry  Inflammation Markers (CRP: Acute Phase) (ESR: Chronic Phase) No results found for: CRP, ESRSEDRATE               Renal Function Markers Lab Results  Component Value Date   BUN 18 12/07/2016   CREATININE 0.89 12/07/2016   GFRAA >60 12/07/2016   GFRNONAA >60 12/07/2016                 Hepatic Function Markers Lab Results  Component Value Date   AST 33 12/07/2016   ALT 16 12/07/2016   ALBUMIN 4.0 12/07/2016   ALKPHOS 110 12/07/2016                 Electrolytes Lab Results  Component Value Date   NA 139 12/07/2016   K 3.9 12/07/2016   CL 106 12/07/2016   CALCIUM 9.7 12/07/2016   MG 2.2 07/14/2015                 Neuropathy Markers Lab Results  Component Value Date  VITAMINB12 1,218 (H) 07/21/2015                 Bone Pathology Markers Lab Results  Component Value Date   ALKPHOS 110 12/07/2016   CALCIUM 9.7 12/07/2016                 Coagulation Parameters Lab Results  Component Value Date   INR 1.0 12/17/2013   LABPROT 13.3 12/17/2013   APTT 91 (H) 10/19/2012   PLT 243 12/07/2016                 Cardiovascular Markers Lab Results  Component Value Date   HGB 12.2 12/07/2016   HCT 35.0 12/07/2016                 Note: Lab results reviewed.  Recent Diagnostic Imaging Results  DG C-Arm 1-60 Min-No Report Fluoroscopy was utilized by the requesting physician.  No radiographic  interpretation.   Note: Imaging results reviewed.        Meds   Current Outpatient Prescriptions:  .  aspirin EC 81 MG  tablet, Take 81 mg by mouth daily., Disp: , Rfl:  .  atorvastatin (LIPITOR) 80 MG tablet, TAKE 1 TABLET (80 MG TOTAL) BY MOUTH DAILY AT 6 PM., Disp: 90 tablet, Rfl: 1 .  BRILINTA 60 MG TABS tablet, TAKE 1 TABLET BY MOUTH TWICE A DAY, Disp: 60 tablet, Rfl: 5 .  ezetimibe (ZETIA) 10 MG tablet, TAKE 1 TABLET (10 MG TOTAL) BY MOUTH DAILY., Disp: 90 tablet, Rfl: 1 .  fluticasone (FLONASE) 50 MCG/ACT nasal spray, Place into the nose., Disp: , Rfl:  .  furosemide (LASIX) 20 MG tablet, TAKE 1 TABLET BY MOUTH DAILY., Disp: 90 tablet, Rfl: 0 .  gabapentin (NEURONTIN) 100 MG capsule, TAKE 1 CAPSULE IN THE MORNING AND 2 CAPSULES IN THE EVENING, Disp: , Rfl: 1 .  gabapentin (NEURONTIN) 300 MG capsule, Take 1 capsule (300 mg total) by mouth 3 (three) times daily., Disp: 90 capsule, Rfl: 1 .  isosorbide mononitrate (IMDUR) 30 MG 24 hr tablet, TAKE 1 TABLET (30 MG TOTAL) BY MOUTH DAILY., Disp: 90 tablet, Rfl: 1 .  lidocaine-prilocaine (EMLA) cream, Apply to affected area once, Disp: 30 g, Rfl: 3 .  loperamide (IMODIUM A-D) 2 MG tablet, Take 2 mg by mouth 4 (four) times daily as needed for diarrhea or loose stools., Disp: , Rfl:  .  losartan (COZAAR) 50 MG tablet, TAKE 1 TABLET BY MOUTH EVERY DAY, Disp: 90 tablet, Rfl: 0 .  Multiple Vitamin (MULTIVITAMIN WITH MINERALS) TABS, Take 1 tablet by mouth daily., Disp: , Rfl:  .  NITROSTAT 0.4 MG SL tablet, PLACE 1 TABLET UNDER THE TOUGUE EVERY 5 MINUTES FOR UP TO 3 DOSES AS NEEDED FOR CHEST PAIN, Disp: 25 tablet, Rfl: 0 .  omeprazole (PRILOSEC) 20 MG capsule, Take 20 mg by mouth daily., Disp: , Rfl:  .  pyridOXINE (VITAMIN B-6) 100 MG tablet, Take 100 mg by mouth 2 (two) times daily., Disp: , Rfl:  .  Vitamin D, Cholecalciferol, 1000 UNITS CAPS, Take 1 capsule by mouth daily., Disp: , Rfl:  No current facility-administered medications for this visit.   Facility-Administered Medications Ordered in Other Visits:  .  sodium chloride flush (NS) 0.9 % injection 10 mL, 10 mL,  Intravenous, PRN, Charlaine Dalton R, MD, 10 mL at 06/06/16 1447 .  sodium chloride flush (NS) 0.9 % injection 10 mL, 10 mL, Intravenous, PRN, Cammie Sickle, MD, 10 mL at 08/15/16  1404  ROS  Constitutional: Denies any fever or chills Gastrointestinal: No reported hemesis, hematochezia, vomiting, or acute GI distress Musculoskeletal: Denies any acute onset joint swelling, redness, loss of ROM, or weakness Neurological: No reported episodes of acute onset apraxia, aphasia, dysarthria, agnosia, amnesia, paralysis, loss of coordination, or loss of consciousness  Allergies  Ms. Ruminski is allergic to ace inhibitors and penicillins.  [place]  Drug: Ms. Maggio  reports that she does not use drugs. Alcohol:  reports that she drinks about 0.6 oz of alcohol per week . Tobacco:  reports that she quit smoking about 42 years ago. She has never used smokeless tobacco. Medical:  has a past medical history of Bradycardia; Coronary artery disease; Heart murmur; Hyperlipidemia; Hypertension; Myocardial infarction [facility]); NSVT (nonsustained ventricular tachycardia) ([employer]); Personal history of chemotherapy; Personal history of radiation therapy; and Uterine cancer ([place]). Surgical: Ms. Goin  has a past surgical history that includes left heart catheterization with coronary angiogram (10/19/2012); percutaneous coronary stent intervention (pci-s) (10/19/2012); percutaneous coronary stent intervention (pci-s) (N/A, 10/23/2012); Abdominal hysterectomy; Cardiac catheterization (N/A, 05/05/2015); and Breast excisional biopsy (Right, 1956). Family: family history includes Breast cancer (age of onset: 6) in her mother; Diabetes in her brother and father; Heart disease in her father and mother; Stroke in her brother.  Constitutional Exam  General appearance: Well nourished, well developed, and well hydrated. In no apparent acute distress Vitals:   12/26/16 1134 12/26/16 1137  BP:  (!) 111/52  Pulse:  (!) 51   Resp:  18  Temp: 98.3 F (36.8 C) 98 F (36.7 C)  SpO2:  97%  Weight: 208 lb (94.3 kg)   Height: _0  (1.626 m)    BMI Assessment: Estimated body mass index is 35.7 kg/m as calculated from the following:   Height as of this encounter: _1  (1.626 m).   Weight as of this encounter: 208 lb (94.3 kg).  BMI interpretation table: BMI level Category Range association with higher incidence of chronic pain  <18 kg/m2 Underweight   18.5-24.9 kg/m2 Ideal body weight   25-29.9 kg/m2 Overweight Increased incidence by 20%  30-34.9 kg/m2 Obese (Class I) Increased incidence by 68%  35-39.9 kg/m2 Severe obesity (Class II) Increased incidence by 136%  >40 kg/m2 Extreme obesity (Class III) Increased incidence by 254%   BMI Readings from Last 4 Encounters:  12/26/16 35.70 kg/m  12/13/16 35.70 kg/m  12/07/16 35.19 kg/m  11/23/16 35.36 kg/m   Wt Readings from Last 4 Encounters:  12/26/16 208 lb (94.3 kg)  12/13/16 208 lb (94.3 kg)  12/07/16 205 lb (93 kg)  11/23/16 206 lb (93.4 kg)  Psych/Mental status: Alert, oriented x 3 (person, place, & time)       Eyes: PERLA Respiratory: No evidence of acute respiratory distress  Cervical Spine Area Exam  Skin & Axial Inspection: No masses, redness, edema, swelling, or associated skin lesions Alignment: Symmetrical Functional ROM: Unrestricted ROM      Stability: No instability detected Muscle Tone/Strength: Functionally intact. No obvious neuro-muscular anomalies detected. Sensory (Neurological): Unimpaired Palpation: No palpable anomalies              Upper Extremity (UE) Exam    Side: Right upper extremity  Side: Left upper extremity  Skin & Extremity Inspection: Skin color, temperature, and hair growth are WNL. No peripheral edema or cyanosis. No masses, redness, swelling, asymmetry, or associated skin lesions. No contractures.  Skin & Extremity Inspection: Skin color, temperature, and hair growth are WNL. No peripheral edema or  cyanosis.  No masses, redness, swelling, asymmetry, or associated skin lesions. No contractures.  Functional ROM: Unrestricted ROM          Functional ROM: Unrestricted ROM          Muscle Tone/Strength: Functionally intact. No obvious neuro-muscular anomalies detected.  Muscle Tone/Strength: Functionally intact. No obvious neuro-muscular anomalies detected.  Sensory (Neurological): Unimpaired          Sensory (Neurological): Unimpaired          Palpation: No palpable anomalies              Palpation: No palpable anomalies              Specialized Test(s): Deferred         Specialized Test(s): Deferred          Thoracic Spine Area Exam  Skin & Axial Inspection: No masses, redness, or swelling Alignment: Symmetrical Functional ROM: Unrestricted ROM Stability: No instability detected Muscle Tone/Strength: Functionally intact. No obvious neuro-muscular anomalies detected. Sensory (Neurological): Unimpaired Muscle strength & Tone: No palpable anomalies  Lumbar Spine Area Exam  Skin & Axial Inspection: No masses, redness, or swelling Alignment: Symmetrical Functional ROM: Unrestricted ROM      Stability: No instability detected Muscle Tone/Strength: Functionally intact. No obvious neuro-muscular anomalies detected. Sensory (Neurological): Unimpaired Palpation: No palpable anomalies       Provocative Tests: Lumbar Hyperextension and rotation test: improved right greater than left       Lumbar Lateral bending test: evaluation deferred today       Patrick's Maneuver: evaluation deferred today                    Gait & Posture Assessment  Ambulation: Unassisted Gait: Relatively normal for age and body habitus Posture: WNL   Lower Extremity Exam    Side: Right lower extremity  Side: Left lower extremity  Skin & Extremity Inspection: Skin color, temperature, and hair growth are WNL. No peripheral edema or cyanosis. No masses, redness, swelling, asymmetry, or associated skin lesions. No contractures.   Skin & Extremity Inspection: Skin color, temperature, and hair growth are WNL. No peripheral edema or cyanosis. No masses, redness, swelling, asymmetry, or associated skin lesions. No contractures.  Functional ROM: Unrestricted ROM          Functional ROM: Unrestricted ROM          Muscle Tone/Strength: Functionally intact. No obvious neuro-muscular anomalies detected.  Muscle Tone/Strength: Functionally intact. No obvious neuro-muscular anomalies detected.  Sensory (Neurological): Unimpaired  Sensory (Neurological): Unimpaired  Palpation: No palpable anomalies  Palpation: No palpable anomalies   Assessment  Primary Diagnosis & Pertinent Problem List: The primary encounter diagnosis was Spondylosis without myelopathy or radiculopathy, lumbar region. Diagnoses of Neuropathy associated with cancer [facility]), Sacral insufficiency fracture, initial encounter, STEMI 10/19/12- LAD DES, and CFX DES 10/24/12, and Benign essential HTN were also pertinent to this visit.  Status Diagnosis  Responding Controlled Controlled 1. Spondylosis without myelopathy or radiculopathy, lumbar region   2. Neuropathy associated with cancer ([place])   3. Sacral insufficiency fracture, initial encounter   4. STEMI 10/19/12- LAD DES, and CFX DES 10/24/12   5. Benign essential HTN      82 year old female with lumbar spondylosis status post bilateral lumbar facet blocks at L3/L4, L4/L5, L5/S1 on 12/13/2016 with greater than 80% improvement in pain for approximately 3-4 days. Patient notes increased range of motion in her lumbar spine, pain relief, ability to ambulate for longer period of  time, and ability to perform activities of daily living with greater ease. This would suggest an effective diagnostic intervention. We will proceed with diagnostic lumbar facet block #2 at the above-mentioned levels, followed by radiofrequency ablation starting with the left side first. Patient instructed to stop her Brilinta 5 days prior to her  scheduled procedure as she did for her last block. Pt has appointment with cardiologist tomorrow and will confirm.  Plan: 1. schedule repeat bilateral lumbar facet blocks #2 at L3/L4, L4/L5, L5/S1 . Patient to stop Brilinta 5 days prior to scheduled procedure. Can resume 24 hours after procedure. 2. Continue gabapentin 300 mg 3 times a day. 3. Continue aquatic aerobic exercises.  Plan of Care  Pharmacotherapy (Medications Ordered): No orders of the defined types were placed in this encounter.  Lab-work, procedure(s), and/or referral(s): Orders Placed This Encounter  Procedures  . LUMBAR FACET(MEDIAL BRANCH NERVE BLOCK) MBNB  #2  Pharmacological management options:  Opioid Analgesics: The patient was informed that there is no guarantee that she would be a candidate for opioid analgesics. The decision will be made following CDC guidelines. This decision will be based on the results of diagnostic studies, as well as Ms. Petties's risk profile.   Membrane stabilizer: To be determined at a later time  Muscle relaxant: To be determined at a later time  NSAID: To be determined at a later time  Other analgesic(s): To be determined at a later time Cymbalta, tizanidine    Interventional management options: Ms. Frakes was informed that there is no guarantee that she would be a candidate for interventional therapies. The decision will be based on the results of diagnostic studies, as well as Ms. Shawler's risk profile.  Procedure(s) under consideration:  -Bilateral L3-L5 medial branch nerve blocks with goal RFA -SI joint injection -Piriformis injection      Provider-requested follow-up: Return for Procedure.  Future Appointments Date Time Provider [place]  12/27/2016 9:20 AM Troy Sine, MD [facility]  01/03/2017 9:00 AM Gillis Santa, MD [facility] None  01/30/2017 2:30 PM [facility] None  02/21/2017 11:00 AM [facility] GYN ONC [facility] None   03/06/2017 2:00 PM [facility]  03/08/2017 2:30 PM [facility] None  03/08/2017 2:45 PM Lequita Asal, MD [facility] None  03/13/2017 2:30 PM [facility] None  11/07/2017 2:00 PM Chrystal, Eulas Post, MD [facility] None    Primary Care Physician: [name], MD Location[facility] Out[facility] Management Facility Note by: Gillis Santa, M.D Date: 12/26/2016; Time: 1:05 PM  Patient Instructions  We will schedule you for lumbar facet block #2. This will be with sedation. Please be sure to stop your Brilinta 5 days prior to your procedure like you did last time.   Do noto eat or drink for 8 hours.  Bring a driver.  Stop blood thinner for 5 days prior to procedure.       Preparing for Procedure with Sedation Instructions: . Oral Intake: Do not eat or drink anything for at least 8 hours prior to your procedure. . Transportation: Public transportation is not allowed. Bring an adult driver. The driver must be physically present in our waiting room before any procedure can be started. [name] Physical Assistance: Bring an adult capable of physically assisting you, in the event you need help. . Blood Pressure Medicine: Take your blood pressure medicine with a sip of water the morning of the procedure. . Insulin: Take only  of your normal insulin dose. . Preventing infections: Shower  with an antibacterial soap the morning of your procedure. . Build-up your immune system: Take 1000 mg of Vitamin C with every meal (3 times a day) the day prior to your procedure. . Pregnancy: If you are pregnant, call and cancel the procedure. . Sickness: If you have a cold, fever, or any active infections, call and cancel the procedure. . Arrival: You must be in the facility at least 30 minutes prior to your scheduled procedure. . Children: Do not bring children with you. . Dress appropriately: Bring dark clothing that you would not mind if they get stained. . Valuables: Do not  bring any jewelry or valuables. Procedure appointments are reserved for interventional treatments only. [name] No Prescription Refills. . No medication changes will be discussed during procedure appointments. No disability issues will be discussed.Facet Blocks Patient Information  Description: The facets are joints in the spine between the vertebrae.  Like any joints in the body, facets can become irritated and painful.  Arthritis can also effect the facets.  By injecting steroids and local anesthetic in and around these joints, we can temporarily block the nerve supply to them.  Steroids act directly on irritated nerves and tissues to reduce selling and inflammation which often leads to decreased pain.  Facet blocks may be done anywhere along the spine from the neck to the low back depending upon the location of your pain.   After numbing the skin with local anesthetic (like Novocaine), a small needle is passed onto the facet joints under x-ray guidance.  You may experience a sensation of pressure while this is being done.  The entire block usually lasts about 15-25 minutes.   Conditions which may be treated by facet blocks:  Low back/buttock pain Neck/shoulder pain Certain types of headaches  Preparation for the injection:  Do not eat any solid food or dairy products within 8 hours of your appointment. You may drink clear liquid up to 3 hours before appointment.  Clear liquids include water, black coffee, juice or soda.  No milk or cream please. You may take your regular medication, including pain medications, with a sip of water before your appointment.  Diabetics should hold regular insulin (if taken separately) and take 1/2 normal NPH dose the morning of the procedure.  Carry some sugar containing items with you to your appointment. A driver must accompany you and be prepared to drive you home after your procedure. Bring all your current medications with you. An IV may be inserted and sedation  may be given at the discretion of the physician. A blood pressure cuff, EKG and other monitors will often be applied during the procedure.  Some patients may need to have extra oxygen administered for a short period. You will be asked to provide medical information, including your allergies and medications, prior to the procedure.  We must know immediately if you are taking blood thinners (like Coumadin/Warfarin) or if you are allergic to IV iodine contrast (dye).  We must know if you could possible be pregnant.  Possible side-effects:  Bleeding from needle site Infection (rare, may require surgery) Nerve injury (rare) Numbness & tingling (temporary) Difficulty urinating (rare, temporary) Spinal headache (a headache worse with upright posture) Light-headedness (temporary) Pain at injection site (serveral days) Decreased blood pressure (rare, temporary) Weakness in arm/leg (temporary) Pressure sensation in back/neck (temporary)   Call if you experience:  Fever/chills associated with headache or increased back/neck pain Headache worsened by an upright position New onset, weakness or numbness of an  extremity below the injection site Hives or difficulty breathing (go to the emergency room) Inflammation or drainage at the injection site(s) Severe back/neck pain greater than usual New symptoms which are concerning to you  Please note:  Although the local anesthetic injected can often make your back or neck feel good for several hours after the injection, the pain will likely return. It takes 3-7 days for steroids to work.  You may not notice any pain relief for at least one week.  If effective, we will often do a series of 2-3 injections spaced 3-6 weeks apart to maximally decrease your pain.  After the initial series, you may be a candidate for a more permanent nerve block of the facets.  If you have any questions, please call #[phone] . [facility]

## 2016-12-27 ENCOUNTER — Ambulatory Visit (INDEPENDENT_AMBULATORY_CARE_PROVIDER_SITE_OTHER): Payer: Medicare Other | Admitting: Cardiovascular Disease

## 2016-12-27 ENCOUNTER — Encounter: Payer: Self-pay | Admitting: Cardiovascular Disease

## 2016-12-27 VITALS — BP 138/66 | HR 43 | Ht 64.0 in | Wt 207.4 lb

## 2016-12-27 DIAGNOSIS — R32 Unspecified urinary incontinence: Secondary | ICD-10-CM

## 2016-12-27 DIAGNOSIS — I2510 Atherosclerotic heart disease of native coronary artery without angina pectoris: Secondary | ICD-10-CM | POA: Diagnosis not present

## 2016-12-27 DIAGNOSIS — M5136 Other intervertebral disc degeneration, lumbar region: Secondary | ICD-10-CM

## 2016-12-27 DIAGNOSIS — I255 Ischemic cardiomyopathy: Secondary | ICD-10-CM

## 2016-12-27 DIAGNOSIS — I447 Left bundle-branch block, unspecified: Secondary | ICD-10-CM | POA: Diagnosis not present

## 2016-12-27 DIAGNOSIS — Z8542 Personal history of malignant neoplasm of other parts of uterus: Secondary | ICD-10-CM | POA: Diagnosis not present

## 2016-12-27 DIAGNOSIS — I10 Essential (primary) hypertension: Secondary | ICD-10-CM

## 2016-12-27 DIAGNOSIS — R001 Bradycardia, unspecified: Secondary | ICD-10-CM | POA: Diagnosis not present

## 2016-12-27 DIAGNOSIS — G4733 Obstructive sleep apnea (adult) (pediatric): Secondary | ICD-10-CM

## 2016-12-27 NOTE — Progress Notes (Signed)
Patient ID: Susan [name], female   DOB: [birth year], 82 y.o.   MRN: [id number]      HPI: Susan [name] is a 82 y.o. female who presents to the office today for a 7 month follow-up cardiology and sleep clinic evaluation.  Susan [name] is originally from [place], [place] and now resides in [place]. On 10/19/2012 she presented [facility] in transfer from [facility] with an anterior wall ST segment elevation myocardial infarction. She had chest pain of greater than 12 hours duration. I performed emergent cardiac catheterization  where her LAD was found to be totally occluded.  She underwent stenting of her LAD extending from the ostium to mid region with 3.0x38 mm and 3.0x16 mm Promus Premier DES stents. Initially she had significant wall motion abnormality involving the LAD territory. She also was found to have a 95% mid AV groove circumflex stenosis.  On 10/23/2012 she underwent successful staged intervention with insertion of a 2.25x12 mm Promus premier DES stent into the circumflex vessel. Subsequently she had done well. She did develop some hypotension leading to reduction in some of her medications.  She was enrolled in the [facility] cardiac rehabilitation program. An exercise Myoview study on 12/03/2012 demonstrated significant salvage of myocardium with only a small area of distal antero-apical, apical, septal and  apical scar without evidence for ischemia. Post-rest ejection fraction was 66% and there was evidence for apical akinesis. Laboratory revealed an increased LDL particle number at 1352 despite being on atorvastatin 80 mg. She was contacted and Zetia 10 mg was just added to this atorvastatin dose. She did have 735 small LDL particles and a calculated LDL of 90 with HDL 40 and triglycerides 119.  She had developed hematuria and was found to have left kidney stones.  She underwent lithotripsy for her multiple kidney stones. A nuclear perfusion study prior to  potential discontinuance of dual anti-platelet therapy on 11/05/2013  was interpreted as low risk, but did show extensive scar in the distal LAD to return distribution.  Post-rest ejection fraction was 40%.  There was evidence for apical akinesis/mild dyskinesis.  She has been documented to have episodes of nonsustained ventricular tachycardia and has seen Susan [name]..  Her bradycardia has been more profound while sleeping.  She was able to exercise and walk without limitation other than back discomfort.  She was evaluated for possible indication for pacemaker was not felt that a pacemaker is indicated presently.  Because of her bradycardia she has not been on beta blocker therapy.  She has had episodes of nonsustained VT noted on a CardioNet monitor.  When I I last saw her admited that her sleep was poor and nonrestorative.  She snored loudly and required frequent daytime naps.  He was sleeping at least 8 hours per night, but poor quality.  She underwent a split-night protocol sleep study on 03/08/2015 and was found to have severe sleep apnea with an AHI of 35.8 overall and more severe with REM sleep with an AHI of 67.5 per hour.  She had significant oxygen desaturation to a nadir of 77%.  There was loud snoring.  I recommended tai chi CPAP therapy at 9 cm water pressure.  Since initiating CPAP therapy.  She has felt well.  She is no longer having to take naps.  She is using choice for DME company.  When I saw her one year ago, she was meeting Medicare compliance. A download was obtained from 05/01/2015 through 05/30/2015 with usage stays at  93% and 70% of days greater than 4 hours.  Her AHI was excellent at 1.9 with a set pressure of 9 cm.    Over the past year, she was diagnosed with endometrial cancer and underwent a total hysterectomy.  She also has a Port-A-Cath that had undergone chemotherapy and also completed 25 radiation remains to her body and 3 intravaginally..  She has developed a neuropathy for  which he takes gabapentin.  He is unaware of chest pain or palpitations.    This past year, she continues to admit to 100% use with CPAP.  A download from 06/12/2016 through 06/13/2016 100% of usage stays.  She has a ResMed AirSense10 AutoSet unit set at a pressure of 9 cm.  Her AHI is 1.1.  She is using a Risk manager &[employer]kel Eson nasal mask.  There is no leak.    Since I last saw her in March 2018, she has done well from a cardiac standpoint.  She denies chest pain, PND, orthopnea, or palpitations.  She continues to teach water aerobics.  However, she has continued to have issues as result of her radiation and chemotherapy for her endometrial CA.  She has developed a neuropathy and as result, has been walking with a cane or a walker.  Her gabapentin dose has been increased to 3 times per day.  She also has a small fracture in her sacrum.  She underwent initial evaluation at [facility] for possible kyphoplasty, but ultimately this was not done.  She also has degenerative disc disease in L3-L5 for which she has been seen pain management.  She has undergone injection.  She we having an additional injection next week and will need to hold Brilinta.  She also has had radiation-induced urinary incontinence.  A recent CT has demonstrated 5 small nodules in her long and she will be undergoing a follow-up CT in December.  She continues to use CPAP with 100% compliance.  A download was obtained in the office today from September 3 through 12/26/2016.  There is 100% compliance.  AHI is 1.2 at her 9 cm water pressure.  She is averaging 7 hours and 57 minutes of sleep per night.  An Epworth Sleepiness Scale score was recalculated the office today and this endorsed at 1.  She presents for evaluation.  Past Medical History:  Diagnosis Date  . Bradycardia   . Coronary artery disease   . Heart murmur   . Hyperlipidemia   . Hypertension   . Myocardial infarction ([place])   . NSVT (nonsustained ventricular tachycardia) ([place])   .  Personal history of chemotherapy    ENDOMETRIAL CA  . Personal history of radiation therapy    ENDOMETRIAL CA  . Uterine cancer ([place])    serrous    Past Surgical History:  Procedure Laterality Date  . ABDOMINAL HYSTERECTOMY    . BREAST EXCISIONAL BIOPSY Right 1956   NEG  . LEFT HEART CATHETERIZATION WITH CORONARY ANGIOGRAM  10/19/2012   Procedure: LEFT HEART CATHETERIZATION WITH CORONARY ANGIOGRAM;  Surgeon: Troy Sine, MD;  Location: [facility] CATH LAB;  Service: Cardiovascular;;  . PERCUTANEOUS CORONARY STENT INTERVENTION (PCI-S)  10/19/2012   Procedure: PERCUTANEOUS CORONARY STENT INTERVENTION (PCI-S);  Surgeon: Troy Sine, MD;  Location: [facility] CATH LAB;  Service: Cardiovascular;;  . PERCUTANEOUS CORONARY STENT INTERVENTION (PCI-S) N/A 10/23/2012   Procedure: PERCUTANEOUS CORONARY STENT INTERVENTION (PCI-S);  Surgeon: Troy Sine, MD;  Location: [facility] CATH LAB;  Service: Cardiovascular;  Laterality: N/A;  . PERIPHERAL VASCULAR CATHETERIZATION  N/A 05/05/2015   Procedure: Glori Luis Cath Insertion;  Surgeon: Algernon Huxley, MD;  Location: [place] CV LAB;  Service: Cardiovascular;  Laterality: N/A;    Allergies  Allergen Reactions  . Ace Inhibitors Cough  . Penicillins Rash    Current Outpatient Prescriptions  Medication Sig Dispense Refill  . aspirin EC 81 MG tablet Take 81 mg by mouth daily.    [name] atorvastatin (LIPITOR) 80 MG tablet TAKE 1 TABLET (80 MG TOTAL) BY MOUTH DAILY AT 6 PM. 90 tablet 1  . BRILINTA 60 MG TABS tablet TAKE 1 TABLET BY MOUTH TWICE A DAY 60 tablet 5  . ezetimibe (ZETIA) 10 MG tablet TAKE 1 TABLET (10 MG TOTAL) BY MOUTH DAILY. 90 tablet 1  . fluticasone (FLONASE) 50 MCG/ACT nasal spray Place into the nose.    . furosemide (LASIX) 20 MG tablet TAKE 1 TABLET BY MOUTH DAILY. 90 tablet 0  . gabapentin (NEURONTIN) 300 MG capsule Take 1 capsule (300 mg total) by mouth 3 (three) times daily. 90 capsule 1  . isosorbide mononitrate (IMDUR) 30 MG 24 hr tablet TAKE 1 TABLET  (30 MG TOTAL) BY MOUTH DAILY. 90 tablet 1  . lidocaine-prilocaine (EMLA) cream Apply to affected area once 30 g 3  . loperamide (IMODIUM A-D) 2 MG tablet Take 2 mg by mouth 4 (four) times daily as needed for diarrhea or loose stools.    [name] losartan (COZAAR) 50 MG tablet TAKE 1 TABLET BY MOUTH EVERY DAY 90 tablet 0  . Multiple Vitamin (MULTIVITAMIN WITH MINERALS) TABS Take 1 tablet by mouth daily.    [name] NITROSTAT 0.4 MG SL tablet PLACE 1 TABLET UNDER THE TOUGUE EVERY 5 MINUTES FOR UP TO 3 DOSES AS NEEDED FOR CHEST PAIN 25 tablet 0  . omeprazole (PRILOSEC) 20 MG capsule Take 20 mg by mouth daily.    . Vitamin D, Cholecalciferol, 1000 UNITS CAPS Take 1 capsule by mouth daily.     No current facility-administered medications for this visit.    Facility-Administered Medications Ordered in Other Visits  Medication Dose Route Frequency Provider Last Rate Last Dose  . sodium chloride flush (NS) 0.9 % injection 10 mL  10 mL Intravenous PRN Cammie Sickle, MD   10 mL at 06/06/16 1447  . sodium chloride flush (NS) 0.9 % injection 10 mL  10 mL Intravenous PRN Cammie Sickle, MD   10 mL at 08/15/16 1404    Social History   Social History  . Marital status: Married    Spouse name: N/A  . Number of children: N/A  . Years of education: N/A   Occupational History  . Not on file.   Social History Main Topics  . Smoking status: Former Smoker    Quit date: 1976  . Smokeless tobacco: Never Used  . Alcohol use 0.6 oz/week    1 Glasses of wine per week  . Drug use: No  . Sexual activity: Yes   Other Topics Concern  . Not on file   Social History Narrative  . No narrative on file    Socially she is married. There is no tobacco use. She drinks alcohol rarely.  Family History  Problem Relation Age of Onset  . Heart disease Mother   . Breast cancer Mother 8  . Heart disease Father   . Diabetes Father   . Diabetes Brother   . Stroke Brother   . Kidney cancer Neg Hx   . Bladder  Cancer Neg Hx  ROS General: Negative; No fevers, chills, or night sweats;  HEENT: Negative; No changes in vision or hearing, sinus congestion, difficulty swallowing Pulmonary: Negative; No cough, wheezing, shortness of breath, hemoptysis Cardiovascular: Negative; No chest pain, presyncope, syncope, palpatations GI: Positive for GERD; No nausea, vomiting, diarrhea, or abdominal pain EH:UDJSHFW of previous kidney stones, contributing to left flank pain intermittently; No dysuria, hematuria, or difficulty voiding; radiation-induced urinary incontinence Musculoskeletal: Positive for small sacral fracture, L3-L5 degenerative disc disease. Hematologic/Oncology: Positive for endometrial cancer, status post chemotherapy and radiation treatments. Endocrine: Negative; no heat/cold intolerance; no diabetes Neuro: Negative; no changes in balance, headaches Skin: Negative; No rashes or skin lesions Psychiatric: Negative; No behavioral problems, depression Sleep: Positive for obstructive sleep apnea, on CPAP therapy.  No breakthrough snoring.  No daytime sleepiness. Other comprehensive 14 point system review is negative.   PE BP 138/66   Pulse (!) 43   Ht _0  (1.626 m)   Wt 207 lb 6.4 oz (94.1 kg)   BMI 35.60 kg/m    Repeat blood pressure by me 142/64  Wt Readings from Last 3 Encounters:  12/27/16 207 lb 6.4 oz (94.1 kg)  12/26/16 208 lb (94.3 kg)  12/13/16 208 lb (94.3 kg)   General: Alert, oriented, no distress.  Skin: normal turgor, no rashes, warm and dry HEENT: Normocephalic, atraumatic. Pupils equal round and reactive to light; sclera anicteric; extraocular muscles intact;  Nose without nasal septal hypertrophy Mouth/Parynx benign; Mallinpatti scale 3 Neck: No JVD, no carotid bruits; normal carotid upstroke Lungs: clear to ausculatation and percussion; no wheezing or rales Chest wall: without tenderness to palpitation Heart: PMI not displaced, RRR, s1 s2 normal, 2/6 systolic  murmur, no diastolic murmur, no rubs, gallops, thrills, or heaves Abdomen: soft, nontender; no hepatosplenomehaly, BS+; abdominal aorta nontender and not dilated by palpation. Back: no CVA tenderness Pulses 2+ Musculoskeletal: full range of motion, normal strength, no joint deformities Extremities: Trace edema; no clubbing, cyanosis, Homan's sign negative  Neurologic: grossly nonfocal; Cranial nerves grossly wnl Psychologic: Normal mood and affect  ECG (independently read by me): Marked sinus bradycardia with PACs.  Ventricular rate at 43.  Left bundle branch block with repolarization changes.  March 2018 ECG (independently read by me): This bradycardia 57 bpm.  First-degree AV block with a PR interval 314 ms. .She now has a left bundle-branch block which is new since her prior tracing.  March 2017 ECG (independently read by me): Sinus bradycardia 52 bpm with first-degree block.  PR interval 284 ms.  LVH by voltage.  QS V1 through V3 concordant with old anteroseptal MI.  March 2016 ECG (independently read by me): Normal sinus rhythm.  Old anteroseptal MI.  First-degree AV block.  October 27, 2013 ECG (independently read by me): Normal sinus rhythm at 60 beats per minute.  First degree AV block with PR interval at 276 ms.  Old anteroseptal MI with QS complex in V1 and V2.  EKG (independently read by me): Sinus rhythm at 55 beats per minute. First degree AV block. Poor progression compatible with her prior septal infarction. T-wave changes anterolaterally.  LABS: BMP Latest Ref Rng & Units 12/07/2016 12/05/2016 08/15/2016  Glucose 65 - 99 mg/dL 114(H) - 115(H)  BUN 6 - 20 mg/dL 18 - 16  Creatinine 0.44 - 1.00 mg/dL 0.89 0.90 0.85  Sodium 135 - 145 mmol/L 139 - 139  Potassium 3.5 - 5.1 mmol/L 3.9 - 3.9  Chloride 101 - 111 mmol/L 106 - 108  CO2 22 - 32  mmol/L 25 - 27  Calcium 8.9 - 10.3 mg/dL 9.7 - 9.3   Hepatic Function Latest Ref Rng & Units 12/07/2016 08/15/2016 06/19/2016  Total Protein 6.5  - 8.1 g/dL 6.7 6.5 6.3  Albumin 3.5 - 5.0 g/dL 4.0 4.1 4.0  AST 15 - 41 U/L 33 33 24  ALT 14 - 54 U/L _0 Alk Phosphatase 38 - 126 U/L 110 132(H) 132(H)  Total Bilirubin 0.3 - 1.2 mg/dL 0.7 0.7 0.6  Bilirubin, Direct 0.1 - 0.5 mg/dL - - -   CBC Latest Ref Rng & Units 12/07/2016 08/15/2016 06/19/2016  WBC 3.6 - 11.0 K/uL 5.2 4.4 5.1  Hemoglobin 12.0 - 16.0 g/dL 12.2 12.1 12.4  Hematocrit 35.0 - 47.0 % 35.0 35.0 36.8  Platelets 150 - 440 K/uL 243 209 215   Lab Results  Component Value Date   MCV 89.4 12/07/2016   MCV 89.1 08/15/2016   MCV 91.5 06/19/2016   Lab Results  Component Value Date   TSH 0.97 06/19/2016   Lab Results  Component Value Date   HGBA1C 5.7 (H) 10/19/2012   Lipid Panel     Component Value Date/Time   CHOL 120 06/19/2016 0950   CHOL 141 04/16/2013 0924   TRIG 99 06/19/2016 0950   TRIG 112 04/16/2013 0924   HDL 54 06/19/2016 0950   HDL 53 04/16/2013 0924   CHOLHDL 2.2 06/19/2016 0950   VLDL 20 06/19/2016 0950   LDLCALC 46 06/19/2016 0950   LDLCALC 66 04/16/2013 0924   RADIOLOGY: No results found.  IMPRESSION:  1. Cardiomyopathy, ischemic: EF 35-40% by echo post-MI 10/20/12   2. Essential hypertension   3. Coronary artery disease involving native coronary artery of native heart without angina pectoris   4. Bradycardia   5. Left bundle branch block   6. OSA (obstructive sleep apnea)   7. Degenerative disc disease, lumbar   8. Urinary incontinence, unspecified type   9. History of endometrial cancer     ASSESSMENT AND PLAN: Ms. Carole Binning is a 82 year-old female who presented to [facility] on 10/19/2012 with a STEMI after an approximate 12 hour delay with back and chest discomfort. When she arrived to [facility] she had excellent door to balloon time at 27 minutes in the setting of an anterior wall ST segment elevation myocardial infarction. She underwent successful stenting of her LAD and because of diffuse disease tandem DES stents  were inserted.  She underwent staged intervention to the circumflex  4 days later.. She had concomitant 50 - 60% RCA narrowing and a large dominant right coronary artery.  She has a history of ACE inhibitor induced cough, but has tolerated ARB therapy with losartan .  A nuclear perfusion study  demonstrated significant salvage of myocardium in the majority of the LAD territory and showed distal apical scar.  She continues to be on dual antiplatelet therapy and now is on the reduced dose of Brilinta per [employer] trial data.  Presently, she is not having any anginal symptomatology.  She continues to be on losartan 50 mg, isosorbide 30 mg, and furosemide 20 mg, which he takes 2 times per week.  Her blood pressure today was slightly increased per hypertensive regulations.  She does have trace edema in her ankles.  I suggested she take Lasix 20 mg daily, which will also be helpful for improved blood pressure control.  On her ECG today she is significantly bradycardic with a pulse in the 40s.  This appears  to be sinus rhythm with PACs.  She has left bundle branch block.  She is not on any rate control medications are AV nodal medicines..  She is asymptomatic with reference to dizziness.  I have recommended that she wear a 48-hour event monitor to make certain that she is not having any further or more significant bradycardia arrhythmia.  On exam, she has a 2/6 systolic murmur.  I have recommended a follow-up echo Doppler study for reassessment of systolic and diastolic function as well as her valvular architecture. She has developed significant issues following radiation and chemotherapy.  She is now undergoing pain management for her degenerative disc disease in her lumbar spine and has a small sacral fracture.  She will undergoing an additional injection into her spine next week.  I have recommended she hold Brilinta for 5 days prior to this procedure.  She was diagnosed with severe obstructive sleep apnea with an  overall AHI of 35.8 per hour and for more severe doing rems sleep with an AHI of 67.5 per hour. She had significant oxygen desaturation to a nadir of 77% and she had very loud snoring.  She continues to be on Zetia 10 mg and atorvastatin 80 mg for aggressive lipid therapy with target LDL less than 70.  Her GERD is controlled with omeprazole.  I will see her in 4-6 weeks for follow-up evaluation.    Time spent: 25 minutes Troy Sine, MD, [facility]  12/27/2016 6:04 PM

## 2016-12-27 NOTE — Patient Instructions (Signed)
Medication Instructions:  Take your furosemide (Lasix) daily-as orescribed  Testing/Procedures: Your physician has recommended that you wear a 48 hour holter monitor. Holter monitors are medical devices that record the heart's electrical activity. Doctors most often use these monitors to diagnose arrhythmias. Arrhythmias are problems with the speed or rhythm of the heartbeat. The monitor is a small, portable device. You can wear one while you do your normal daily activities. This is usually used to diagnose what is causing palpitations/syncope (passing out).  Your physician has requested that you have an echocardiogram. Echocardiography is a painless test that uses sound waves to create images of your heart. It provides your doctor with information about the size and shape of your heart and how well your heart's chambers and valves are working. This procedure takes approximately one hour. There are no restrictions for this procedure.  This will be done at our [facility] location:  [place]: Your physician recommends that you schedule a follow-up appointment in: 6 weeks with Dr. Claiborne Billings.   Any Other Special Instructions Will Be Listed Below (If Applicable).  Per Dr. Gerome Apley 5 days prior to procedure.   If you need a refill on your cardiac medications before your next appointment, please call your pharmacy.

## 2017-01-03 ENCOUNTER — Ambulatory Visit
Admission: RE | Admit: 2017-01-03 | Discharge: 2017-01-03 | Disposition: A | Discharge status: Home / Self Care | Payer: Medicare Other | Source: Ambulatory Visit | Attending: Student in an Organized Health Care Education/Training Program | Admitting: Student in an Organized Health Care Education/Training Program

## 2017-01-03 ENCOUNTER — Encounter: Payer: Self-pay | Admitting: Student in an Organized Health Care Education/Training Program

## 2017-01-03 ENCOUNTER — Ambulatory Visit (HOSPITAL_BASED_OUTPATIENT_CLINIC_OR_DEPARTMENT_OTHER): Payer: Medicare Other | Admitting: Student in an Organized Health Care Education/Training Program

## 2017-01-03 VITALS — BP 124/76 | HR 47 | Temp 98.3°F | Resp 15 | Ht 64.0 in | Wt 208.0 lb

## 2017-01-03 DIAGNOSIS — G63 Polyneuropathy in diseases classified elsewhere: Secondary | ICD-10-CM

## 2017-01-03 DIAGNOSIS — C801 Malignant (primary) neoplasm, unspecified: Secondary | ICD-10-CM

## 2017-01-03 DIAGNOSIS — Z880 Allergy status to penicillin: Secondary | ICD-10-CM | POA: Insufficient documentation

## 2017-01-03 DIAGNOSIS — G629 Polyneuropathy, unspecified: Secondary | ICD-10-CM | POA: Insufficient documentation

## 2017-01-03 DIAGNOSIS — M8448XA Pathological fracture, other site, initial encounter for fracture: Secondary | ICD-10-CM | POA: Insufficient documentation

## 2017-01-03 DIAGNOSIS — M47816 Spondylosis without myelopathy or radiculopathy, lumbar region: Secondary | ICD-10-CM | POA: Diagnosis present

## 2017-01-03 DIAGNOSIS — Z888 Allergy status to other drugs, medicaments and biological substances status: Secondary | ICD-10-CM | POA: Diagnosis not present

## 2017-01-03 DIAGNOSIS — Z955 Presence of coronary angioplasty implant and graft: Secondary | ICD-10-CM | POA: Diagnosis not present

## 2017-01-03 MED ORDER — ROPIVACAINE HCL 2 MG/ML IJ SOLN
9.0000 mL | Freq: Once | INTRAMUSCULAR | Status: AC
  Administered 2017-01-03: 10 mL via PERINEURAL
  Filled 2017-01-03: qty 10

## 2017-01-03 MED ORDER — FENTANYL CITRATE (PF) 100 MCG/2ML IJ SOLN
25.0000 ug | INTRAMUSCULAR | Status: DC | PRN
  Administered 2017-01-03: 50 ug via INTRAVENOUS
  Filled 2017-01-03: qty 2

## 2017-01-03 MED ORDER — LIDOCAINE HCL (PF) 1 % IJ SOLN
10.0000 mL | Freq: Once | INTRAMUSCULAR | Status: AC
  Administered 2017-01-03: 5 mL
  Filled 2017-01-03: qty 10

## 2017-01-03 NOTE — Progress Notes (Signed)
Safety precautions to be maintained throughout the outpatient stay will include: orient to surroundings, keep bed in low position, maintain call bell within reach at all times, provide assistance with transfer out of bed and ambulation.  

## 2017-01-03 NOTE — Patient Instructions (Signed)

## 2017-01-03 NOTE — Progress Notes (Signed)
[place] question is isPatient's Name: Susan [name]  MRN: [id number]  Referring Provider: [name], MD  DOB: 1934/08/30  PCP: [name], MD  DOS: 01/03/2017  Note by: Gillis Santa, MD  Service setting: Ambulatory outpatient  Specialty: Interventional Pain Management  [name] type: Established  Location: [facility] (AMB) Pain Management Facility  Visit type: Interventional Procedure   Primary Reason for Visit: Interventional Pain Management Treatment. CC: Back Pain (lower)  Procedure:  Anesthesia, Analgesia, Anxiolysis:  Type: Diagnostic Medial Branch Facet Block #2 Region: Lumbar Level: L3, L4, L5,  Medial Branch Level(s) Laterality: Bilateral  Type: Local Anesthesia with Moderate (Conscious) Sedation Local Anesthetic: Lidocaine 1% Route: Intravenous (IV) IV Access: Secured Sedation: Meaningful verbal contact was maintained at all times during Susan procedure  Indication(s): Analgesia and Anxiety   Indications: 1. Spondylosis without myelopathy or radiculopathy, lumbar region   2. Neuropathy associated with cancer ([place])   3. Sacral insufficiency fracture, initial encounter    Pain Score: Pre-procedure: 0-No pain/10 Post-procedure: 0-No pain/10  Pre-op Assessment:  Susan [name] is a 82 y.o. (year old), female [name], seen today for interventional treatment. She  has a past surgical history that includes left heart catheterization with coronary angiogram (10/19/2012); percutaneous coronary stent intervention (pci-s) (10/19/2012); percutaneous coronary stent intervention (pci-s) (N/A, 10/23/2012); Abdominal hysterectomy; Cardiac catheterization (N/A, 05/05/2015); and Breast excisional biopsy (Right, 1956). Susan [name] has a current medication list which includes Susan following prescription(s): aspirin ec, atorvastatin, brilinta, ezetimibe, fluticasone, furosemide, gabapentin, isosorbide mononitrate, lidocaine-prilocaine, loperamide, losartan, multivitamin with minerals, nitrostat,  omeprazole, vitamin b-12, and vitamin d (cholecalciferol), and Susan following Facility-Administered Medications: fentanyl, sodium chloride flush, and sodium chloride flush. Her primarily concern today is Susan Back Pain (lower)  Initial Vital Signs: Blood pressure 124/76, pulse (!) 47, temperature 98.3 F (36.8 C), temperature source Oral, resp. rate 15, height 5\' 4"  (1.626 m), weight 208 lb (94.3 kg), SpO2 100 %. BMI: Estimated body mass index is 35.7 kg/m as calculated from Susan following:   Height as of this encounter: 5\' 4"  (1.626 m).   Weight as of this encounter: 208 lb (94.3 kg).  Risk Assessment: Allergies: Reviewed. She is allergic to ace inhibitors and penicillins.  Allergy Precautions: None required Coagulopathies: Reviewed. None identified.  Blood-thinner therapy: None at this time Active Infection(s): Reviewed. None identified. Susan [name] is afebrile  Site Confirmation: [name] was asked to confirm Susan procedure and laterality before marking Susan site Procedure checklist: Completed Consent: Before Susan procedure and under Susan influence of no sedative(s), amnesic(s), or anxiolytics, Susan [name] was informed of Susan treatment options, risks and possible complications. To fulfill our ethical and legal obligations, as recommended by Susan [employer]'s Code of Ethics, I have informed Susan [name] of my clinical impression; Susan nature and purpose of Susan treatment or procedure; Susan risks, benefits, and possible complications of Susan intervention; Susan alternatives, including doing nothing; Susan risk(s) and benefit(s) of Susan alternative treatment(s) or procedure(s); and Susan risk(s) and benefit(s) of doing nothing. Susan [name] was provided information about Susan general risks and possible complications associated with Susan procedure. These may include, but are not limited to: failure to achieve desired goals, infection, bleeding, organ or nerve damage, allergic reactions, paralysis,  and death. In addition, Susan [name] was informed of those risks and complications associated to Spine-related procedures, such as failure to decrease pain; infection (i.e.: Meningitis, epidural or intraspinal abscess); bleeding (i.e.: epidural hematoma, subarachnoid hemorrhage, or any other type of intraspinal or peri-dural bleeding); organ or nerve damage (  i.e.: Any type of peripheral nerve, nerve root, or spinal cord injury) with subsequent damage to sensory, motor, and/or autonomic systems, resulting in permanent pain, numbness, and/or weakness of one or several areas of Susan body; allergic reactions; (i.e.: anaphylactic reaction); and/or death. Furthermore, Susan [name] was informed of those risks and complications associated with Susan medications. These include, but are not limited to: allergic reactions (i.e.: anaphylactic or anaphylactoid reaction(s)); adrenal axis suppression; blood sugar elevation that in diabetics may result in ketoacidosis or comma; water retention that in patients with history of congestive heart failure may result in shortness of breath, pulmonary edema, and decompensation with resultant heart failure; weight gain; swelling or edema; medication-induced neural toxicity; particulate matter embolism and blood vessel occlusion with resultant organ, and/or nervous system infarction; and/or aseptic necrosis of one or more joints. Finally, Susan [name] was informed that Medicine is not an exact science; therefore, there is also Susan possibility of unforeseen or unpredictable risks and/or possible complications that may result in a catastrophic outcome. Susan [name] indicated having understood very clearly. We have given Susan [name] no guarantees and we have made no promises. Enough time was given to Susan [name] to ask questions, all of which were answered to Susan [name]'s satisfaction. Susan [name] has indicated that she wanted to continue with Susan procedure. Attestation: I, Susan ordering  provider, attest that I have discussed with Susan [name] Susan benefits, risks, side-effects, alternatives, likelihood of achieving goals, and potential problems during recovery for Susan procedure that I have provided informed consent. Date: 01/03/2017; Time: 11:19 AM  Pre-Procedure Preparation:  Monitoring: As per clinic protocol. Respiration, ETCO2, SpO2, BP, heart rate and rhythm monitor placed and checked for adequate function Safety Precautions: [name] was assessed for positional comfort and pressure points before starting Susan procedure. Time-out: I initiated and conducted Susan "Time-out" before starting Susan procedure, as per protocol. Susan [name] was asked to participate by confirming Susan accuracy of Susan "Time Out" information. Verification of Susan correct person, site, and procedure were performed and confirmed by me, Susan nursing staff, and Susan [name]. "Time-out" conducted as per Joint Commission's Universal Protocol (UP.01.01.01). "Time-out" Date & Time: 01/03/2017; 0955 hrs.  Description of Procedure Process:   Position: Prone Target Area: For Lumbar Facet blocks, Susan target is Susan groove formed by Susan junction of Susan transverse process and superior articular process. For Susan L5 dorsal ramus, Susan target is Susan notch between superior articular process and sacral ala.  Approach: Paramedial approach. Area Prepped: Entire Posterior Lumbosacral Region Prepping solution: ChloraPrep (2% chlorhexidine gluconate and 70% isopropyl alcohol) Safety Precautions: Aspiration looking for blood return was conducted prior to all injections. At no point did we inject any substances, as a needle was being advanced. No attempts were made at seeking any paresthesias. Safe injection practices and needle disposal techniques used. Medications properly checked for expiration dates. SDV (single dose vial) medications used. Description of Susan Procedure: Protocol guidelines were followed. Susan [name] was placed in position  over Susan fluoroscopy table. Susan target area was identified and Susan area prepped in Susan usual manner. Skin desensitized using vapocoolant spray. Skin & deeper tissues infiltrated with local anesthetic. Appropriate amount of time allowed to pass for local anesthetics to take effect. Susan procedure needle was introduced through Susan skin, ipsilateral to Susan reported pain, and advanced to Susan target area. Employing Susan "Medial Branch Technique", Susan needles were advanced to Susan angle made by Susan superior and medial portion of Susan transverse process, and Susan lateral and inferior  portion of Susan superior articulating process of Susan targeted vertebral bodies. This area is known as "Burton's Eye" or Susan "Eye of Susan [place] Dog". A procedure needle was introduced through Susan skin, and this time advanced to Susan angle made by Susan superior and medial border of Susan sacral ala, and Susan lateral border of Susan S1 vertebral body.  Negative aspiration confirmed. Solution injected in intermittent fashion, asking for systemic symptoms every 0.5cc of injectate. Susan needles were then removed and Susan area cleansed, making sure to leave some of Susan prepping solution back to take advantage of its long term bactericidal properties.   Illustration of Susan posterior view of Susan lumbar spine and Susan posterior neural structures. Laminae of L2 through S1 are labeled. DPRL5, dorsal primary ramus of L5; DPRS1, dorsal primary ramus of S1; DPR3, dorsal primary ramus of L3; FJ, facet (zygapophyseal) joint L3-L4; I, inferior articular process of L4; LB1, lateral branch of dorsal primary ramus of L1; IAB, inferior articular branches from L3 medial branch (supplies L4-L5 facet joint); IBP, intermediate branch plexus; MB3, medial branch of dorsal primary ramus of L3; NR3, third lumbar nerve root; S, superior articular process of L5; SAB, superior articular branches from L4 (supplies L4-5 facet joint also); TP3, transverse process of L3.  Vitals:    01/03/17 1015 01/03/17 1025 01/03/17 1035 01/03/17 1045  BP: 133/70 126/74 (!) 122/55 124/76  Pulse: (!) 51 (!) 48 (!) 49 (!) 47  Resp: 16 17 16 15   Temp:      TempSrc:      SpO2: 93% 100% 100% 100%  Weight:      Height:        Start Time: 0956 hrs. End Time: 1014 hrs. Materials:  Needle(s) Type: Regular needle Gauge: 22G Length: 3.5-in Medication(s): We administered fentaNYL, ropivacaine (PF) 2 mg/mL (0.2%), and lidocaine (PF). Please see chart orders for dosing details.  Imaging Guidance (Spinal):  Type of Imaging Technique: Fluoroscopy Guidance (Spinal) Indication(s): Assistance in needle guidance and placement for procedures requiring needle placement in or near specific anatomical locations not easily accessible without such assistance. Exposure Time: Please see nurses notes. Contrast: None used. Fluoroscopic Guidance: I was personally present during Susan use of fluoroscopy. "Tunnel Vision Technique" used to obtain Susan best possible view of Susan target area. Parallax error corrected before commencing Susan procedure. "Direction-depth-direction" technique used to introduce Susan needle under continuous pulsed fluoroscopy. Once target was reached, antero-posterior, oblique, and lateral fluoroscopic projection used confirm needle placement in all planes. Images permanently stored in EMR. Interpretation: No contrast injected. I personally interpreted Susan imaging intraoperatively. Adequate needle placement confirmed in multiple planes. Permanent images saved into Susan [name]'s record.  Antibiotic Prophylaxis:  Indication(s): None identified Antibiotic given: None  Post-operative Assessment:  EBL: None Complications: No immediate post-treatment complications observed by team, or reported by [name]. Note: Susan [name] tolerated Susan entire procedure well. A repeat set of vitals were taken after Susan procedure and Susan [name] was kept under observation following institutional policy, for this  type of procedure. Post-procedural neurological assessment was performed, showing return to baseline, prior to discharge. Susan [name] was provided with post-procedure discharge instructions, including a section on how to identify potential problems. Should any problems arise concerning this procedure, Susan [name] was given instructions to immediately contact us, at any time, without hesitation. In any case, we plan to contact Susan [name] by telephone for a follow-up status report regarding this interventional procedure. Comments:  No additional relevant information.  5 out of  5 strength bilateral lower extremity: Plantar flexion, dorsiflexion, knee flexion, knee extension. Plan of Care   Imaging Orders     DG C-Arm 1-60 Min-No Report Procedure Orders    No procedure(s) ordered today    Medications ordered for procedure: Meds ordered this encounter  Medications  . fentaNYL (SUBLIMAZE) injection 25-50 mcg    Make sure Narcan is available in Susan pyxis when using this medication. In Susan event of respiratory depression (RR< 8/min): Titrate NARCAN (naloxone) in increments of 0.1 to 0.2 mg IV at 2-3 minute intervals, until desired degree of reversal.  . ropivacaine (PF) 2 mg/mL (0.2%) (NAROPIN) injection 9 mL  . lidocaine (PF) (XYLOCAINE) 1 % injection 10 mL   Medications administered: We administered fentaNYL, ropivacaine (PF) 2 mg/mL (0.2%), and lidocaine (PF).  See Susan medical record for exact dosing, route, and time of administration.  New Prescriptions   No medications on file   Disposition: Discharge home  Discharge Date & Time: 01/03/2017; 1050 hrs.   Physician-requested Follow-up: Return in about 2 weeks (around 01/17/2017) for Post Procedure Evaluation. Future Appointments Date Time Provider [place]  01/04/2017 1:00 PM [facility] ECHO 1 [facility]  01/10/2017 11:30 AM [facility]-CH MONITOR [facility]  01/16/2017 1:30 PM Gillis Santa, MD [facility]  None  01/30/2017 2:30 PM [facility]-MO LAB [facility] None  02/14/2017 2:40 PM Troy Sine, MD [facility]  02/21/2017 11:00 AM [facility] GYN ONC [facility] None  03/06/2017 2:00 PM [facility] [place]  03/08/2017 2:30 PM [facility] LAB [facility] None  03/08/2017 2:45 PM Lequita Asal, MD [facility] None  03/13/2017 2:30 PM [facility] LAB [facility] None  11/07/2017 2:00 PM Noreene Filbert, MD [facility] None   Primary Care Physician: [name], MD Location: [facility] Note by: Gillis Santa, MD Date: 01/03/2017; Time: 11:24 AM  Disclaimer:  Medicine is not an exact science. Susan only guarantee in medicine is that nothing is guaranteed. It is important to note that Susan decision to proceed with this intervention was based on Susan information collected from Susan [name]. Susan Data and conclusions were drawn from Susan [name]'s questionnaire, Susan interview, and Susan physical examination. Because Susan information was provided in large part by Susan [name], it cannot be guaranteed that it has not been purposely or unconsciously manipulated. Every effort has been made to obtain as much relevant data as possible for this evaluation. It is important to note that Susan conclusions that lead to this procedure are derived in large part from Susan available data. Always take into account that Susan treatment will also be dependent on availability of resources and existing treatment guidelines, considered by other Pain Management Practitioners as being common knowledge and practice, at Susan time of Susan intervention. For Medico-Legal purposes, it is also important to point out that variation in procedural techniques and pharmacological choices are Susan acceptable norm. Susan indications, contraindications, technique, and results of Susan above procedure should only be interpreted and judged by a Board-Certified Interventional Pain Specialist with extensive familiarity and expertise in  Susan same exact procedure and technique.

## 2017-01-04 ENCOUNTER — Ambulatory Visit (HOSPITAL_COMMUNITY): Payer: Medicare Other | Attending: Cardiovascular Disease

## 2017-01-04 ENCOUNTER — Telehealth: Payer: Self-pay | Admitting: *Deleted

## 2017-01-04 ENCOUNTER — Other Ambulatory Visit: Payer: Self-pay

## 2017-01-04 DIAGNOSIS — I340 Nonrheumatic mitral (valve) insufficiency: Secondary | ICD-10-CM | POA: Diagnosis not present

## 2017-01-04 DIAGNOSIS — I2510 Atherosclerotic heart disease of native coronary artery without angina pectoris: Secondary | ICD-10-CM

## 2017-01-04 DIAGNOSIS — I10 Essential (primary) hypertension: Secondary | ICD-10-CM

## 2017-01-04 DIAGNOSIS — I255 Ischemic cardiomyopathy: Secondary | ICD-10-CM | POA: Diagnosis present

## 2017-01-04 MED ORDER — PERFLUTREN LIPID MICROSPHERE
1.0000 mL | INTRAVENOUS | Status: AC | PRN
  Administered 2017-01-04: 2 mL via INTRAVENOUS

## 2017-01-04 NOTE — Telephone Encounter (Signed)
Denies complications post procedure. 

## 2017-01-10 ENCOUNTER — Ambulatory Visit (INDEPENDENT_AMBULATORY_CARE_PROVIDER_SITE_OTHER): Payer: Medicare Other

## 2017-01-10 ENCOUNTER — Other Ambulatory Visit: Payer: Self-pay | Admitting: Cardiovascular Disease

## 2017-01-10 DIAGNOSIS — R001 Bradycardia, unspecified: Secondary | ICD-10-CM | POA: Diagnosis not present

## 2017-01-16 ENCOUNTER — Encounter: Payer: Self-pay | Admitting: Student in an Organized Health Care Education/Training Program

## 2017-01-16 ENCOUNTER — Ambulatory Visit
Payer: Medicare Other | Attending: Student in an Organized Health Care Education/Training Program | Admitting: Student in an Organized Health Care Education/Training Program

## 2017-01-16 VITALS — BP 141/61 | HR 55 | Temp 97.7°F | Resp 18 | Ht 64.0 in | Wt 206.0 lb

## 2017-01-16 DIAGNOSIS — C801 Malignant (primary) neoplasm, unspecified: Secondary | ICD-10-CM | POA: Diagnosis not present

## 2017-01-16 DIAGNOSIS — I10 Essential (primary) hypertension: Secondary | ICD-10-CM | POA: Diagnosis not present

## 2017-01-16 DIAGNOSIS — R001 Bradycardia, unspecified: Secondary | ICD-10-CM | POA: Insufficient documentation

## 2017-01-16 DIAGNOSIS — G629 Polyneuropathy, unspecified: Secondary | ICD-10-CM | POA: Insufficient documentation

## 2017-01-16 DIAGNOSIS — Z85828 Personal history of other malignant neoplasm of skin: Secondary | ICD-10-CM | POA: Diagnosis not present

## 2017-01-16 DIAGNOSIS — Z87442 Personal history of urinary calculi: Secondary | ICD-10-CM | POA: Insufficient documentation

## 2017-01-16 DIAGNOSIS — I25119 Atherosclerotic heart disease of native coronary artery with unspecified angina pectoris: Secondary | ICD-10-CM | POA: Insufficient documentation

## 2017-01-16 DIAGNOSIS — R918 Other nonspecific abnormal finding of lung field: Secondary | ICD-10-CM | POA: Insufficient documentation

## 2017-01-16 DIAGNOSIS — G63 Polyneuropathy in diseases classified elsewhere: Secondary | ICD-10-CM | POA: Diagnosis not present

## 2017-01-16 DIAGNOSIS — E785 Hyperlipidemia, unspecified: Secondary | ICD-10-CM | POA: Diagnosis not present

## 2017-01-16 DIAGNOSIS — Z7982 Long term (current) use of aspirin: Secondary | ICD-10-CM | POA: Diagnosis not present

## 2017-01-16 DIAGNOSIS — Z9889 Other specified postprocedural states: Secondary | ICD-10-CM | POA: Insufficient documentation

## 2017-01-16 DIAGNOSIS — M545 Low back pain: Secondary | ICD-10-CM | POA: Diagnosis not present

## 2017-01-16 DIAGNOSIS — R319 Hematuria, unspecified: Secondary | ICD-10-CM | POA: Insufficient documentation

## 2017-01-16 DIAGNOSIS — M792 Neuralgia and neuritis, unspecified: Secondary | ICD-10-CM | POA: Diagnosis not present

## 2017-01-16 DIAGNOSIS — I219 Acute myocardial infarction, unspecified: Secondary | ICD-10-CM | POA: Insufficient documentation

## 2017-01-16 DIAGNOSIS — C541 Malignant neoplasm of endometrium: Secondary | ICD-10-CM | POA: Diagnosis not present

## 2017-01-16 DIAGNOSIS — I472 Ventricular tachycardia: Secondary | ICD-10-CM | POA: Diagnosis not present

## 2017-01-16 DIAGNOSIS — M48061 Spinal stenosis, lumbar region without neurogenic claudication: Secondary | ICD-10-CM | POA: Insufficient documentation

## 2017-01-16 DIAGNOSIS — I255 Ischemic cardiomyopathy: Secondary | ICD-10-CM

## 2017-01-16 DIAGNOSIS — Z880 Allergy status to penicillin: Secondary | ICD-10-CM | POA: Insufficient documentation

## 2017-01-16 DIAGNOSIS — Z955 Presence of coronary angioplasty implant and graft: Secondary | ICD-10-CM | POA: Insufficient documentation

## 2017-01-16 DIAGNOSIS — K219 Gastro-esophageal reflux disease without esophagitis: Secondary | ICD-10-CM | POA: Insufficient documentation

## 2017-01-16 DIAGNOSIS — M47816 Spondylosis without myelopathy or radiculopathy, lumbar region: Secondary | ICD-10-CM | POA: Insufficient documentation

## 2017-01-16 DIAGNOSIS — I252 Old myocardial infarction: Secondary | ICD-10-CM | POA: Diagnosis not present

## 2017-01-16 DIAGNOSIS — N200 Calculus of kidney: Secondary | ICD-10-CM | POA: Insufficient documentation

## 2017-01-16 DIAGNOSIS — M8448XA Pathological fracture, other site, initial encounter for fracture: Secondary | ICD-10-CM | POA: Diagnosis not present

## 2017-01-16 DIAGNOSIS — M8580 Other specified disorders of bone density and structure, unspecified site: Secondary | ICD-10-CM | POA: Insufficient documentation

## 2017-01-16 DIAGNOSIS — Z87891 Personal history of nicotine dependence: Secondary | ICD-10-CM | POA: Insufficient documentation

## 2017-01-16 NOTE — Progress Notes (Signed)
Patient's Name: Susan [name]  MRN: [id number]  Referring Provider: [name], MD  DOB: 10/05/1934  PCP: [name], MD  DOS: 01/16/2017  Note by: Gillis Santa, MD  Service setting: Ambulatory outpatient  Specialty: Interventional Pain Management  Location: [facility] (AMB) Pain Management Facility    Patient type: Established   Primary Reason(s) for Visit: Encounter for post-procedure evaluation of chronic illness with mild to moderate exacerbation CC: Back Pain (lower)  HPI  Susan [name] is a 82 y.o. year old, female patient, who comes today for a post-procedure evaluation. She has STEMI 10/19/12- LAD DES, and CFX DES 10/24/12; HTN (hypertension); Cardiomyopathy, ischemic: EF 35-40% by echo post-MI 10/20/12; CAD (coronary artery disease)- elective CFX DES 10/24/12 for recurrent angina; Dyslipidemia; NSVT (nonsustained ventricular tachycardia) ([place]); Bradycardia- tolerating low dose beta blocker; Hematuria; Kidney stones; Endometrial cancer ([place]); CA skin, basal cell; Narrowing of intervertebral disc space; Accumulation of fluid in tissues; Benign essential HTN; Gastro-esophageal reflux disease without esophagitis; Hyperlipidemia; Calculus of kidney; Lumbar canal stenosis; Osteopenia; Arthritis, degenerative; Carcinoma of endometrium ([place]); Postoperative state; Neuralgia neuritis, sciatic nerve; Edema; Heart murmur; Absence of bladder continence; Neuropathy associated with cancer ([place]); History of endometrial cancer; Sacral insufficiency fracture; and Pulmonary nodules on her problem list. Her primarily concern today is the Back Pain (lower)  Pain Assessment: Location: Right, Left, Lower Back Radiating: na today Onset: More than a month ago Duration: Chronic pain Quality: Cramping, Stabbing Severity: 5 /10 (self-reported pain score)  Note: Reported level is compatible with observation.                   When using our objective Pain Scale, levels between 6 and 10/10 are said to belong in an  emergency room, as it progressively worsens from a 6/10, described as severely limiting, requiring emergency care not usually available at an outpatient pain management facility. At a 6/10 level, communication becomes difficult and requires great effort. Assistance to reach the emergency department may be required. Facial flushing and profuse sweating along with potentially dangerous increases in heart rate and blood pressure will be evident. Effect on ADL: prolonged walking, standing for a long time Timing: Constant Modifying factors: sitting, proceures  Susan [name] comes in today for post-procedure evaluation after the treatment done on 01/03/2017.  Further details on both, my assessment(s), as well as the proposed treatment plan, please see below.  Post-Procedure Assessment  01/03/2017 Procedure: Diagnostic bilateral L3, L4, L5 facet blocks Pre-procedure pain score:  0/10 Post-procedure pain score: 0/10         Influential Factors: BMI: 35.36 kg/m Intra-procedural challenges: None observed.         Assessment challenges: None detected.              Reported side-effects: None.        Post-procedural adverse reactions or complications: None reported         Sedation: Please see nurses note. When no sedatives are used, the analgesic levels obtained are directly associated to the effectiveness of the local anesthetics. However, when sedation is provided, the level of analgesia obtained during the initial 1 hour following the intervention, is believed to be the result of a combination of factors. These factors may include, but are not limited to: 1. The effectiveness of the local anesthetics used. 2. The effects of the analgesic(s) and/or anxiolytic(s) used. 3. The degree of discomfort experienced by the patient at the time of the procedure. 4. The patients ability and reliability in recalling and recording  the events. 5. The presence and influence of possible secondary gains and/or  psychosocial factors. Reported result: Relief experienced during the 1st hour after the procedure: 100 % (Ultra-Short Term Relief)            Interpretative annotation: Clinically appropriate result. Analgesia during this period is likely to be Local Anesthetic and/or IV Sedative (Analgesic/Anxiolytic) related.          Effects of local anesthetic: The analgesic effects attained during this period are directly associated to the localized infiltration of local anesthetics and therefore cary significant diagnostic value as to the etiological location, or anatomical origin, of the pain. Expected duration of relief is directly dependent on the pharmacodynamics of the local anesthetic used. Long-acting (4-6 hours) anesthetics used.  Reported result: Relief during the next 4 to 6 hour after the procedure: 80 % (Short-Term Relief)            Interpretative annotation: Clinically appropriate result. Analgesia during this period is likely to be Local Anesthetic-related.          Long-term benefit: Defined as the period of time past the expected duration of local anesthetics (1 hour for short-acting and 4-6 hours for long-acting). With the possible exception of prolonged sympathetic blockade from the local anesthetics, benefits during this period are typically attributed to, or associated with, other factors such as analgesic sensory neuropraxia, antiinflammatory effects, or beneficial biochemical changes provided by agents other than the local anesthetics.  Reported result: Extended relief following procedure: 30% (Long-Term Relief)            Interpretative annotation: Clinically appropriate result. Good relief. No permanent benefit expected. Limited inflammation. Possible mechanical aggravating factors.          Current benefits: Defined as reported results that persistent at this point in time.   Analgesia: 0-25 % Susan [name] reports improvement of axial symptoms. Function: Somewhat improved ROM: Somewhat  improved Interpretative annotation: Recurrence of symptoms. No permanent benefit expected. Effective diagnostic intervention.          Interpretation: Results would suggest a successful diagnostic intervention.                  Plan:  Please see "Plan of Care" for details.        Laboratory Chemistry  Inflammation Markers (CRP: Acute Phase) (ESR: Chronic Phase) No results found for: CRP, ESRSEDRATE               Renal Function Markers Lab Results  Component Value Date   BUN 18 12/07/2016   CREATININE 0.89 12/07/2016   GFRAA >60 12/07/2016   GFRNONAA >60 12/07/2016                 Hepatic Function Markers Lab Results  Component Value Date   AST 33 12/07/2016   ALT 16 12/07/2016   ALBUMIN 4.0 12/07/2016   ALKPHOS 110 12/07/2016                 Electrolytes Lab Results  Component Value Date   NA 139 12/07/2016   K 3.9 12/07/2016   CL 106 12/07/2016   CALCIUM 9.7 12/07/2016   MG 2.2 07/14/2015                 Neuropathy Markers Lab Results  Component Value Date   VITAMINB12 1,218 (H) 07/21/2015                 Bone Pathology Markers Lab Results  Component Value Date  ALKPHOS 110 12/07/2016   CALCIUM 9.7 12/07/2016                 Coagulation Parameters Lab Results  Component Value Date   INR 1.0 12/17/2013   LABPROT 13.3 12/17/2013   APTT 91 (H) 10/19/2012   PLT 243 12/07/2016                 Cardiovascular Markers Lab Results  Component Value Date   HGB 12.2 12/07/2016   HCT 35.0 12/07/2016                 Note: Lab results reviewed.  Recent Diagnostic Imaging Results  ECHOCARDIOGRAM COMPLETE                         [facility]                        [address], [place] [address]                            [phone]  ------------------------------------------------------------------- Transthoracic Echocardiography  Patient:    Susan [name], Susan [name] MR #:       [id number] Study Date:  01/04/2017 Gender:     F Age:        65 Height:     162.6 cm Weight:     94.3 kg BSA:        2.11 m^2 Pt. Status: Room:   ORDERING     Shelva Majestic, M.D.  REFERRING    Shelva Majestic, M.D.  SONOGRAPHER  Cindy Hazy, Worthington Croitoru, MD  PERFORMING   [facility], Outpatient  cc:  ------------------------------------------------------------------- LV EF: 40% -   45%  ------------------------------------------------------------------- Indications:      I25.5 ICardiomyopathy- ischemic.  ------------------------------------------------------------------- History:   PMH:   Coronary artery disease.  Risk factors: Hypertension.  ------------------------------------------------------------------- Study Conclusions  - Left ventricle: The cavity size was normal. There was moderate   concentric hypertrophy. Systolic function was mildly to   moderately reduced. The estimated ejection fraction was in the   range of 40% to 45%. Severe hypokinesis of the inferior,   inferoseptal, and apical myocardium. Mild anteroseptal and   inferolateral hypokinesis. Doppler parameters are consistent with   abnormal left ventricular relaxation (grade 1 diastolic   dysfunction). Doppler parameters are consistent with high   ventricular filling pressure. - Aortic valve: There was mild stenosis. Peak velocity (S): 238   cm/s. Mean gradient (S): 12 mm Hg. Valve area (VTI): 2.7 cm^2.   Valve area (Vmax): 2.18 cm^2. Valve area (Vmean): 2.39 cm^2. - Mitral valve: Calcified annulus. Severe thickening and   calcification. Transvalvular velocity was within the normal   range. There was no evidence for stenosis. There was mild   regurgitation. Valve area by continuity equation (using LVOT   flow): 3.08 cm^2. - Left atrium: The atrium was severely dilated. - Right ventricle: The cavity size was normal. Wall thickness was   normal. Systolic function was normal. - Tricuspid valve: There was no  regurgitation. - Pulmonary arteries: Systolic pressure was within the normal   range. PA peak pressure: 32 mm Hg (S). - Pericardium, extracardiac: A trivial pericardial effusion was   identified.  Impressions:  - The anterior leaflet of the mitral valve is very thickened   calcified. Mitral valve thickening was also noted in the echo   09/2012, but these images are unavailable for comparison.  ------------------------------------------------------------------- Study data:  Comparison was made to the study of 10/20/2012.  Study status:  Routine.  Procedure:  The patient reported no pain pre or post test. Transthoracic echocardiography. Image quality was adequate. The study was technically difficult, as a result of poor sound wave transmission. Intravenous contrast (Definity) was administered.  Study completion:  There were no complications.     Transthoracic echocardiography.  M-mode, complete 2D, spectral Doppler, and color Doppler.  Birthdate:  Patient birthdate: May 24, 1934.  Age:  Patient is 82 yr old.  Sex:  Gender: female. BMI: 35.7 kg/m^2.  Blood pressure:     138/66  Patient status: Outpatient.  Study date:  Study date: 01/04/2017. Study time: 01:26 PM.  Location:  [facility]  -------------------------------------------------------------------  ------------------------------------------------------------------- Left ventricle:  The cavity size was normal. There was moderate concentric hypertrophy. Systolic function was mildly to moderately reduced. The estimated ejection fraction was in the range of 40% to 45%.  Regional wall motion abnormalities:  Severe hypokinesis of the inferior, inferoseptal, and apical myocardium. Mild anteroseptal and inferolateral hypokinesis. Doppler parameters are consistent with abnormal left ventricular relaxation (grade 1 diastolic dysfunction). Doppler parameters are consistent with high ventricular filling  pressure.  ------------------------------------------------------------------- Aortic valve:   Trileaflet; mildly thickened, mildly calcified leaflets. Mobility was not restricted.  Doppler:   There was mild stenosis.   There was no regurgitation.    VTI ratio of LVOT to aortic valve: 0.65. Valve area (VTI): 2.7 cm^2. Indexed valve area (VTI): 1.28 cm^2/m^2. Peak velocity ratio of LVOT to aortic valve: 0.53. Valve area (Vmax): 2.18 cm^2. Indexed valve area (Vmax): 1.04 cm^2/m^2. Mean velocity ratio of LVOT to aortic valve: 0.58. Valve area (Vmean): 2.39 cm^2. Indexed valve area (Vmean): 1.14 cm^2/m^2.    Mean gradient (S): 12 mm Hg. Peak gradient (S): 23 mm Hg.  ------------------------------------------------------------------- Aorta:  Aortic root: The aortic root was normal in size.  ------------------------------------------------------------------- Mitral valve:   Calcified annulus.  Severe thickening and calcification. Mobility was not restricted.  Doppler: Transvalvular velocity was within the normal range. There was no evidence for stenosis. There was mild regurgitation.    Valve area by continuity equation (using LVOT flow): 3.08 cm^2. Indexed valve area by continuity equation (using LVOT flow): 1.46 cm^2/m^2. Mean gradient (D): 2 mm Hg. Peak gradient (D): 4 mm Hg.  ------------------------------------------------------------------- Left atrium:  The atrium was severely dilated.  ------------------------------------------------------------------- Right ventricle:  The cavity size was normal. Wall thickness was normal. Systolic function was normal.  ------------------------------------------------------------------- Pulmonic valve:    Structurally normal valve.   Cusp separation was normal.  Doppler:  Transvalvular velocity was within the normal range. There was no evidence for stenosis. There was  no regurgitation.  ------------------------------------------------------------------- Tricuspid valve:   Structurally normal valve.    Doppler: Transvalvular velocity was within the normal range. There was no regurgitation.  ------------------------------------------------------------------- Pulmonary artery:   The main pulmonary artery was normal-sized. Systolic pressure was within the normal range.  ------------------------------------------------------------------- Right atrium:  The atrium was normal in size.  ------------------------------------------------------------------- Pericardium:  A trivial pericardial effusion was identified.  ------------------------------------------------------------------- Systemic veins: Inferior vena cava: The vessel was dilated. The respirophasic diameter changes were in the normal range (= 50%), consistent with elevated central venous pressure.  ------------------------------------------------------------------- Measurements   Left ventricle  Value          Reference  LV ID, ED, PLAX chordal           (H)     52.2  mm       43 - 52  LV ID, ES, PLAX chordal                   36.6  mm       23 - 38  LV fx shortening, PLAX chordal            30    %        >=29  LV PW thickness, ED                       14.6  mm       ---------  IVS/LV PW ratio, ED                       1              <=1.3  Stroke volume, 2D                         142   ml       ---------  Stroke volume/bsa, 2D                     67    ml/m^2   ---------  LV filling time, D, DP                    700   ms       ---------  LV e&', lateral                            8.11  cm/s     ---------  LV E/e&', lateral                          13.07          ---------  LV e&', medial                             4.39  cm/s     ---------  LV E/e&', medial                           24.15          ---------  LV e&', average                            6.25   cm/s     ---------  LV E/e&', average                          16.96          ---------    Ventricular septum                        Value          Reference  IVS thickness, ED  14.6  mm       ---------    LVOT                                      Value          Reference  LVOT ID, S                                23    mm       ---------  LVOT area                                 4.15  cm^2     ---------  LVOT peak velocity, S                     125   cm/s     ---------  LVOT mean velocity, S                     94.5  cm/s     ---------  LVOT VTI, S                               34.2  cm       ---------  LVOT peak gradient, S                     6     mm Hg    ---------    Aortic valve                              Value          Reference  Aortic valve peak velocity, S             238   cm/s     ---------  Aortic valve mean velocity, S             164   cm/s     ---------  Aortic valve VTI, S                       52.5  cm       ---------  Aortic mean gradient, S                   12    mm Hg    ---------  Aortic peak gradient, S                   23    mm Hg    ---------  VTI ratio, LVOT/AV                        0.65           ---------  Aortic valve area, VTI                    2.7   cm^2     ---------  Aortic valve area/bsa, VTI                1.28  cm^2/m^2 ---------  Velocity ratio,  peak, LVOT/AV             0.53           ---------  Aortic valve area, peak velocity          2.18  cm^2     ---------  Aortic valve area/bsa, peak               1.04  cm^2/m^2 ---------  velocity  Velocity ratio, mean, LVOT/AV             0.58           ---------  Aortic valve area, mean velocity          2.39  cm^2     ---------  Aortic valve area/bsa, mean               1.14  cm^2/m^2 ---------  velocity    Aorta                                     Value          Reference  Aortic root ID, ED                        31    mm       ---------  Ascending aorta ID, A-P, S                 36    mm       ---------    Left atrium                               Value          Reference  LA ID, A-P, ES                            48    mm       ---------  LA ID/bsa, A-P                    (H)     2.28  cm/m^2   <=2.2  LA volume, S                              90    ml       ---------  LA volume/bsa, S                          42.7  ml/m^2   ---------  LA volume, ES, 1-p A4C                    91    ml       ---------  LA volume/bsa, ES, 1-p A4C                43.2  ml/m^2   ---------  LA volume, ES, 1-p A2C                    88    ml       ---------  LA volume/bsa, ES, 1-p A2C  41.8  ml/m^2   ---------    Mitral valve                              Value          Reference  Mitral E-wave peak velocity               106   cm/s     ---------  Mitral A-wave peak velocity               107   cm/s     ---------  Mitral mean velocity, D                   66.26 cm/s     ---------  Mitral deceleration time          (H)     560   ms       150 - 230  Mitral mean gradient, D                   2     mm Hg    ---------  Mitral peak gradient, D                   4     mm Hg    ---------  Mitral E/A ratio, peak                    1              ---------  Mitral valve area, LVOT                   3.08  cm^2     ---------  continuity  Mitral valve area/bsa, LVOT               1.46  cm^2/m^2 ---------  continuity    Pulmonary arteries                        Value          Reference  PA pressure, S, DP                (H)     32    mm Hg    <=30    Tricuspid valve                           Value          Reference  Tricuspid regurg peak velocity            246   cm/s     ---------  Tricuspid peak RV-RA gradient             24    mm Hg    ---------    Systemic veins                            Value          Reference  Estimated CVP                             8     mm Hg    ---------    Right ventricle  Value          Reference  RV pressure,  S, DP                (H)     32    mm Hg    <=30  Legend: (L)  and  (H)  mark values outside specified reference range.  ------------------------------------------------------------------- Prepared and Electronically Authenticated by  Skeet Latch, MD 2018-10-11T17:38:21  Complexity Note: Imaging results reviewed. Results shared with Susan [name], using Layman's terms.                         Meds   Current Outpatient Prescriptions:  .  Ascorbic Acid (VITAMIN C) 1000 MG tablet, Take 1,000 mg by mouth daily., Disp: , Rfl:  .  aspirin EC 81 MG tablet, Take 81 mg by mouth daily., Disp: , Rfl:  .  atorvastatin (LIPITOR) 80 MG tablet, TAKE 1 TABLET (80 MG TOTAL) BY MOUTH DAILY AT 6 PM., Disp: 90 tablet, Rfl: 1 .  BRILINTA 60 MG TABS tablet, TAKE 1 TABLET BY MOUTH TWICE A DAY, Disp: 60 tablet, Rfl: 5 .  ezetimibe (ZETIA) 10 MG tablet, TAKE 1 TABLET (10 MG TOTAL) BY MOUTH DAILY., Disp: 90 tablet, Rfl: 1 .  fluticasone (FLONASE) 50 MCG/ACT nasal spray, Place into the nose., Disp: , Rfl:  .  furosemide (LASIX) 20 MG tablet, TAKE 1 TABLET BY MOUTH DAILY., Disp: 90 tablet, Rfl: 0 .  gabapentin (NEURONTIN) 300 MG capsule, Take 1 capsule (300 mg total) by mouth 3 (three) times daily., Disp: 90 capsule, Rfl: 1 .  isosorbide mononitrate (IMDUR) 30 MG 24 hr tablet, TAKE 1 TABLET (30 MG TOTAL) BY MOUTH DAILY., Disp: 90 tablet, Rfl: 1 .  lidocaine-prilocaine (EMLA) cream, Apply to affected area once, Disp: 30 g, Rfl: 3 .  loperamide (IMODIUM A-D) 2 MG tablet, Take 2 mg by mouth 4 (four) times daily as needed for diarrhea or loose stools., Disp: , Rfl:  .  losartan (COZAAR) 50 MG tablet, TAKE 1 TABLET BY MOUTH EVERY DAY, Disp: 90 tablet, Rfl: 3 .  Multiple Vitamin (MULTIVITAMIN WITH MINERALS) TABS, Take 1 tablet by mouth daily., Disp: , Rfl:  .  NITROSTAT 0.4 MG SL tablet, PLACE 1 TABLET UNDER THE TOUGUE EVERY 5 MINUTES FOR UP TO 3 DOSES AS NEEDED FOR CHEST PAIN, Disp: 25 tablet, Rfl: 0 .  omeprazole  (PRILOSEC) 20 MG capsule, Take 20 mg by mouth daily., Disp: , Rfl:  .  vitamin B-12 (CYANOCOBALAMIN) 1000 MCG tablet, Take 1,000 mcg by mouth daily., Disp: , Rfl:  .  Vitamin D, Cholecalciferol, 1000 UNITS CAPS, Take 1 capsule by mouth daily., Disp: , Rfl:  No current facility-administered medications for this visit.   Facility-Administered Medications Ordered in Other Visits:  .  sodium chloride flush (NS) 0.9 % injection 10 mL, 10 mL, Intravenous, PRN, Charlaine Dalton R, MD, 10 mL at 06/06/16 1447 .  sodium chloride flush (NS) 0.9 % injection 10 mL, 10 mL, Intravenous, PRN, Cammie Sickle, MD, 10 mL at 08/15/16 1404  ROS  Constitutional: Denies any fever or chills Gastrointestinal: No reported hemesis, hematochezia, vomiting, or acute GI distress Musculoskeletal: Denies any acute onset joint swelling, redness, loss of ROM, or weakness Neurological: No reported episodes of acute onset apraxia, aphasia, dysarthria, agnosia, amnesia, paralysis, loss of coordination, or loss of consciousness  Allergies  Susan [name] is allergic to ace inhibitors and penicillins.  [place]  Drug: Susan [name]  reports that she does not use drugs. Alcohol:  reports that she drinks about 0.6 oz of alcohol per week . Tobacco:  reports that she quit smoking about 42 years ago. She has never used smokeless tobacco. Medical:  has a past medical history of Bradycardia; Coronary artery disease; Echoencephalogram abnormality; Heart murmur; History of Holter monitoring; Hyperlipidemia; Hypertension; Myocardial infarction [facility]); NSVT (nonsustained ventricular tachycardia) ([place]); Personal history of chemotherapy; Personal history of radiation therapy; and Uterine cancer ([place]). Surgical: Susan [name]  has a past surgical history that includes left heart catheterization with coronary angiogram (10/19/2012); percutaneous coronary stent intervention (pci-s) (10/19/2012); percutaneous coronary stent intervention (pci-s)  (N/A, 10/23/2012); Abdominal hysterectomy; Cardiac catheterization (N/A, 05/05/2015); and Breast excisional biopsy (Right, 1956). Family: family history includes Breast cancer (age of onset: 31) in her mother; Diabetes in her brother and father; Heart disease in her father and mother; Stroke in her brother.  Constitutional Exam  General appearance: Well nourished, well developed, and well hydrated. In no apparent acute distress Vitals:   01/16/17 1327  BP: (!) 141/61  Pulse: (!) 55  Resp: 18  Temp: 97.7 F (36.5 C)  Weight: 206 lb (93.4 kg)  Height: _0  (1.626 m)   BMI Assessment: Estimated body mass index is 35.36 kg/m as calculated from the following:   Height as of this encounter: _1  (1.626 m).   Weight as of this encounter: 206 lb (93.4 kg).  BMI interpretation table: BMI level Category Range association with higher incidence of chronic pain  <18 kg/m2 Underweight   18.5-24.9 kg/m2 Ideal body weight   25-29.9 kg/m2 Overweight Increased incidence by 20%  30-34.9 kg/m2 Obese (Class I) Increased incidence by 68%  35-39.9 kg/m2 Severe obesity (Class II) Increased incidence by 136%  >40 kg/m2 Extreme obesity (Class III) Increased incidence by 254%   BMI Readings from Last 4 Encounters:  01/16/17 35.36 kg/m  01/03/17 35.70 kg/m  12/27/16 35.60 kg/m  12/26/16 35.70 kg/m   Wt Readings from Last 4 Encounters:  01/16/17 206 lb (93.4 kg)  01/03/17 208 lb (94.3 kg)  12/27/16 207 lb 6.4 oz (94.1 kg)  12/26/16 208 lb (94.3 kg)  Psych/Mental status: Alert, oriented x 3 (person, place, & time)       Eyes: PERLA Respiratory: No evidence of acute respiratory distress  Cervical Spine Area Exam  Skin & Axial Inspection: No masses, redness, edema, swelling, or associated skin lesions Alignment: Symmetrical Functional ROM: Unrestricted ROM      Stability: No instability detected Muscle Tone/Strength: Functionally intact. No obvious neuro-muscular anomalies detected. Sensory  (Neurological): Unimpaired Palpation: No palpable anomalies              Upper Extremity (UE) Exam    Side: Right upper extremity  Side: Left upper extremity  Skin & Extremity Inspection: Skin color, temperature, and hair growth are WNL. No peripheral edema or cyanosis. No masses, redness, swelling, asymmetry, or associated skin lesions. No contractures.  Skin & Extremity Inspection: Skin color, temperature, and hair growth are WNL. No peripheral edema or cyanosis. No masses, redness, swelling, asymmetry, or associated skin lesions. No contractures.  Functional ROM: Unrestricted ROM          Functional ROM: Unrestricted ROM          Muscle Tone/Strength: Functionally intact. No obvious neuro-muscular anomalies detected.  Muscle Tone/Strength: Functionally intact. No obvious neuro-muscular anomalies detected.  Sensory (Neurological): Unimpaired          Sensory (Neurological): Unimpaired  Palpation: No palpable anomalies              Palpation: No palpable anomalies              Specialized Test(s): Deferred         Specialized Test(s): Deferred          Thoracic Spine Area Exam  Skin & Axial Inspection: No masses, redness, or swelling Alignment: Symmetrical Functional ROM: Unrestricted ROM Stability: No instability detected Muscle Tone/Strength: Functionally intact. No obvious neuro-muscular anomalies detected. Sensory (Neurological): Unimpaired Muscle strength & Tone: No palpable anomalies  Lumbar Spine Area Exam  Skin & Axial Inspection: No masses, redness, or swelling Alignment: Symmetrical Functional ROM: Unrestricted ROM      Stability: No instability detected Muscle Tone/Strength: Functionally intact. No obvious neuro-muscular anomalies detected. Sensory (Neurological): Unimpaired Palpation: No palpable anomalies       Provocative Tests: Lumbar Hyperextension and rotation test: Improved after treatment       Lumbar Lateral bending test: Improved after treatment        Patrick's Maneuver: evaluation deferred today                    Gait & Posture Assessment  Ambulation: Unassisted Gait: Relatively normal for age and body habitus Posture: WNL   Lower Extremity Exam    Side: Right lower extremity  Side: Left lower extremity  Skin & Extremity Inspection: Skin color, temperature, and hair growth are WNL. No peripheral edema or cyanosis. No masses, redness, swelling, asymmetry, or associated skin lesions. No contractures.  Skin & Extremity Inspection: Skin color, temperature, and hair growth are WNL. No peripheral edema or cyanosis. No masses, redness, swelling, asymmetry, or associated skin lesions. No contractures.  Functional ROM: Unrestricted ROM          Functional ROM: Unrestricted ROM          Muscle Tone/Strength: Functionally intact. No obvious neuro-muscular anomalies detected.  Muscle Tone/Strength: Functionally intact. No obvious neuro-muscular anomalies detected.  Sensory (Neurological): Unimpaired  Sensory (Neurological): Unimpaired  Palpation: No palpable anomalies  Palpation: No palpable anomalies   Assessment  Primary Diagnosis & Pertinent Problem List: The primary encounter diagnosis was Spondylosis without myelopathy or radiculopathy, lumbar region. Diagnoses of Neuropathy associated with cancer [facility]), Sacral insufficiency fracture, initial encounter, Benign essential HTN, and Cardiomyopathy, ischemic: EF 35-40% by echo post-MI 10/20/12 were also pertinent to this visit.  Status Diagnosis  Responding Stable Stable 1. Spondylosis without myelopathy or radiculopathy, lumbar region   2. Neuropathy associated with cancer ([place])   3. Sacral insufficiency fracture, initial encounter   4. Benign essential HTN   5. Cardiomyopathy, ischemic: EF 35-40% by echo post-MI 10/20/12      82 year old female with lumbar spondylosis status post bilateral lumbar facet blocks at L3/L4, L4/L5, L5/S1 on 12/13/2016 and on 01/03/2017 with greater than 70%  improvement in pain for approximately 3-4 days. Patient notes increased range of motion in her lumbar spine, pain relief, ability to ambulate for longer period of time, and ability to perform activities of daily living with greater ease. Patient will be going to a chiropractor for spine alignment and gait issues since she has been walking with a cane for many years. She states that she will contact us when she wants to proceed with a radiofrequency ablation but is pleased with her positive diagnostic results from her bilateral lumbar facet blocks at L3/4, L4/5 L5/S1.  Patient will need to stop Brilinta 5 days prior to her  scheduled procedure whenever we do lumbar radiofrequency ablation.  Plan: -follow up in 6-8 weeks -discuss lumbar RFA at follow up,  If patient wants to proceed, start with left L3-L5 first (patient must be off Brilinta for 5 days, has coronary stents in place) -Continue gabapentin 300 mg 3 times a day. -Continue aquatic aerobic exercises.    Considering:   - -Bilateral L3-L5 medial branch nerve  RFA -SI joint injection -Piriformis injection      Provider-requested follow-up: Return in about 6 weeks (around 02/27/2017).  Future Appointments Date Time Provider [place]  01/30/2017 2:30 PM [facility] None  02/14/2017 2:40 PM Troy Sine, MD [facility]  02/21/2017 11:00 AM [facility] GYN ONC [facility] None  02/27/2017 1:30 PM Gillis Santa, MD [facility] None  03/06/2017 2:00 PM [facility]  03/08/2017 2:30 PM [facility] None  03/08/2017 2:45 PM Lequita Asal, MD [facility] None  03/13/2017 2:30 PM [facility] None  11/07/2017 2:00 PM Noreene Filbert, MD [facility] None    Primary Care Physician: [name], MD Location: [facility] Outpatient Pain Management Facility Note by: Gillis Santa, M.D Date: 01/16/2017; Time: 2:34 PM  Patient Instructions  Pain Management Discharge  Instructions  General Discharge Instructions :  If you need to reach your doctor call: Monday-Friday 8:00 am - 4:00 pm at [phone] or toll free [phone].  After clinic hours [phone] to have operator reach doctor.  Bring all of your medication bottles to all your appointments in the pain clinic.  To cancel or reschedule your appointment with Pain Management please remember to call 24 hours in advance to avoid a fee.  Refer to the educational materials which you have been given on: General Risks, I had my Procedure. Discharge Instructions, Post Sedation.

## 2017-01-16 NOTE — Progress Notes (Signed)
Safety precautions to be maintained throughout the outpatient stay will include: orient to surroundings, keep bed in low position, maintain call bell within reach at all times, provide assistance with transfer out of bed and ambulation.  

## 2017-01-16 NOTE — Patient Instructions (Signed)
Pain Management Discharge Instructions  General Discharge Instructions :  If you need to reach your doctor call: Monday-Friday 8:00 am - 4:00 pm at [phone] or toll free [phone].  After clinic hours [phone] to have operator reach doctor.  Bring all of your medication bottles to all your appointments in the pain clinic.  To cancel or reschedule your appointment with Pain Management please remember to call 24 hours in advance to avoid a fee.  Refer to the educational materials which you have been given on: General Risks, I had my Procedure. Discharge Instructions, Post Sedation.

## 2017-01-17 ENCOUNTER — Encounter (HOSPITAL_COMMUNITY): Payer: Self-pay | Admitting: Student

## 2017-01-17 ENCOUNTER — Emergency Department
Admission: EM | Admit: 2017-01-17 | Discharge: 2017-01-17 | Disposition: A | Discharge status: Other Acute Inpatient Hospital | Payer: Medicare Other | Source: Home / Self Care | Attending: Emergency Medicine | Admitting: Emergency Medicine

## 2017-01-17 ENCOUNTER — Encounter: Payer: Self-pay | Admitting: Intensive Care

## 2017-01-17 ENCOUNTER — Emergency Department: Payer: Medicare Other

## 2017-01-17 ENCOUNTER — Inpatient Hospital Stay (HOSPITAL_COMMUNITY)
Admission: AD | Admit: 2017-01-17 | Discharge: 2017-01-18 | DRG: 309 | Disposition: A | Discharge status: Home / Self Care | Payer: Medicare Other | Source: Other Acute Inpatient Hospital | Attending: Cardiology | Admitting: Cardiology

## 2017-01-17 ENCOUNTER — Telehealth: Payer: Self-pay | Admitting: *Deleted

## 2017-01-17 DIAGNOSIS — I2510 Atherosclerotic heart disease of native coronary artery without angina pectoris: Secondary | ICD-10-CM | POA: Diagnosis not present

## 2017-01-17 DIAGNOSIS — Z8249 Family history of ischemic heart disease and other diseases of the circulatory system: Secondary | ICD-10-CM

## 2017-01-17 DIAGNOSIS — Z8542 Personal history of malignant neoplasm of other parts of uterus: Secondary | ICD-10-CM

## 2017-01-17 DIAGNOSIS — I255 Ischemic cardiomyopathy: Secondary | ICD-10-CM | POA: Diagnosis not present

## 2017-01-17 DIAGNOSIS — E871 Hypo-osmolality and hyponatremia: Secondary | ICD-10-CM

## 2017-01-17 DIAGNOSIS — Z8673 Personal history of transient ischemic attack (TIA), and cerebral infarction without residual deficits: Secondary | ICD-10-CM | POA: Insufficient documentation

## 2017-01-17 DIAGNOSIS — R008 Other abnormalities of heart beat: Secondary | ICD-10-CM | POA: Diagnosis not present

## 2017-01-17 DIAGNOSIS — I129 Hypertensive chronic kidney disease with stage 1 through stage 4 chronic kidney disease, or unspecified chronic kidney disease: Secondary | ICD-10-CM

## 2017-01-17 DIAGNOSIS — E785 Hyperlipidemia, unspecified: Secondary | ICD-10-CM | POA: Diagnosis not present

## 2017-01-17 DIAGNOSIS — Z87891 Personal history of nicotine dependence: Secondary | ICD-10-CM | POA: Diagnosis not present

## 2017-01-17 DIAGNOSIS — I495 Sick sinus syndrome: Secondary | ICD-10-CM | POA: Diagnosis not present

## 2017-01-17 DIAGNOSIS — I472 Ventricular tachycardia: Secondary | ICD-10-CM | POA: Diagnosis present

## 2017-01-17 DIAGNOSIS — R001 Bradycardia, unspecified: Secondary | ICD-10-CM | POA: Diagnosis present

## 2017-01-17 DIAGNOSIS — Z794 Long term (current) use of insulin: Secondary | ICD-10-CM

## 2017-01-17 DIAGNOSIS — E669 Obesity, unspecified: Secondary | ICD-10-CM | POA: Diagnosis not present

## 2017-01-17 DIAGNOSIS — G4733 Obstructive sleep apnea (adult) (pediatric): Secondary | ICD-10-CM | POA: Diagnosis not present

## 2017-01-17 DIAGNOSIS — I10 Essential (primary) hypertension: Secondary | ICD-10-CM | POA: Diagnosis present

## 2017-01-17 DIAGNOSIS — Z9221 Personal history of antineoplastic chemotherapy: Secondary | ICD-10-CM | POA: Diagnosis not present

## 2017-01-17 DIAGNOSIS — M549 Dorsalgia, unspecified: Secondary | ICD-10-CM | POA: Diagnosis not present

## 2017-01-17 DIAGNOSIS — Z7951 Long term (current) use of inhaled steroids: Secondary | ICD-10-CM

## 2017-01-17 DIAGNOSIS — Z823 Family history of stroke: Secondary | ICD-10-CM

## 2017-01-17 DIAGNOSIS — I4891 Unspecified atrial fibrillation: Secondary | ICD-10-CM | POA: Diagnosis present

## 2017-01-17 DIAGNOSIS — Z880 Allergy status to penicillin: Secondary | ICD-10-CM

## 2017-01-17 DIAGNOSIS — E1122 Type 2 diabetes mellitus with diabetic chronic kidney disease: Secondary | ICD-10-CM

## 2017-01-17 DIAGNOSIS — Z7902 Long term (current) use of antithrombotics/antiplatelets: Secondary | ICD-10-CM | POA: Insufficient documentation

## 2017-01-17 DIAGNOSIS — Z923 Personal history of irradiation: Secondary | ICD-10-CM | POA: Diagnosis not present

## 2017-01-17 DIAGNOSIS — N189 Chronic kidney disease, unspecified: Secondary | ICD-10-CM

## 2017-01-17 DIAGNOSIS — K9189 Other postprocedural complications and disorders of digestive system: Secondary | ICD-10-CM | POA: Insufficient documentation

## 2017-01-17 DIAGNOSIS — Z955 Presence of coronary angioplasty implant and graft: Secondary | ICD-10-CM

## 2017-01-17 DIAGNOSIS — Z6834 Body mass index (BMI) 34.0-34.9, adult: Secondary | ICD-10-CM | POA: Diagnosis not present

## 2017-01-17 DIAGNOSIS — Z79899 Other long term (current) drug therapy: Secondary | ICD-10-CM | POA: Insufficient documentation

## 2017-01-17 DIAGNOSIS — Z7982 Long term (current) use of aspirin: Secondary | ICD-10-CM

## 2017-01-17 DIAGNOSIS — G8929 Other chronic pain: Secondary | ICD-10-CM | POA: Diagnosis not present

## 2017-01-17 DIAGNOSIS — K863 Pseudocyst of pancreas: Secondary | ICD-10-CM

## 2017-01-17 DIAGNOSIS — N179 Acute kidney failure, unspecified: Secondary | ICD-10-CM | POA: Insufficient documentation

## 2017-01-17 DIAGNOSIS — Z90710 Acquired absence of both cervix and uterus: Secondary | ICD-10-CM

## 2017-01-17 DIAGNOSIS — Z888 Allergy status to other drugs, medicaments and biological substances status: Secondary | ICD-10-CM

## 2017-01-17 DIAGNOSIS — I252 Old myocardial infarction: Secondary | ICD-10-CM

## 2017-01-17 DIAGNOSIS — I440 Atrioventricular block, first degree: Secondary | ICD-10-CM | POA: Diagnosis not present

## 2017-01-17 DIAGNOSIS — I350 Nonrheumatic aortic (valve) stenosis: Secondary | ICD-10-CM

## 2017-01-17 DIAGNOSIS — I447 Left bundle-branch block, unspecified: Secondary | ICD-10-CM | POA: Diagnosis present

## 2017-01-17 DIAGNOSIS — I493 Ventricular premature depolarization: Secondary | ICD-10-CM | POA: Diagnosis present

## 2017-01-17 LAB — BASIC METABOLIC PANEL
ANION GAP: 10 (ref 5–15)
BUN: 17 mg/dL (ref 6–20)
CHLORIDE: 104 mmol/L (ref 101–111)
CO2: 26 mmol/L (ref 22–32)
Calcium: 9.8 mg/dL (ref 8.9–10.3)
Creatinine, Ser: 1.02 mg/dL — ABNORMAL HIGH (ref 0.44–1.00)
GFR calc Af Amer: 59 mL/min — ABNORMAL LOW (ref >60)
GFR calc non Af Amer: 51 mL/min — ABNORMAL LOW (ref >60)
GLUCOSE: 102 mg/dL — AB (ref 65–99)
POTASSIUM: 3.8 mmol/L (ref 3.5–5.1)
Sodium: 140 mmol/L (ref 135–145)

## 2017-01-17 LAB — CBC
HEMATOCRIT: 38.7 % (ref 35.0–47.0)
HEMOGLOBIN: 13 g/dL (ref 12.0–16.0)
MCH: 30.7 pg (ref 26.0–34.0)
MCHC: 33.7 g/dL (ref 32.0–36.0)
MCV: 91 fL (ref 80.0–100.0)
Platelets: 233 10*3/uL (ref 150–440)
RBC: 4.26 MIL/uL (ref 3.80–5.20)
RDW: 14.3 % (ref 11.5–14.5)
WBC: 6.3 10*3/uL (ref 3.6–11.0)

## 2017-01-17 LAB — TROPONIN I: Troponin I: <0.03 ng/mL (ref <0.03)

## 2017-01-17 MED ORDER — ENOXAPARIN SODIUM 40 MG/0.4ML ~~LOC~~ SOLN: 40.0000 mg | SUBCUTANEOUS | Status: DC

## 2017-01-17 MED ORDER — EZETIMIBE 10 MG PO TABS: 10.0000 mg | ORAL_TABLET | Freq: Every day | ORAL | Status: DC

## 2017-01-17 MED ORDER — ISOSORBIDE MONONITRATE ER 30 MG PO TB24: 30.0000 mg | ORAL_TABLET | Freq: Every day | ORAL | Status: DC

## 2017-01-17 MED ORDER — LOSARTAN POTASSIUM 50 MG PO TABS
50.0000 mg | ORAL_TABLET | Freq: Every day | ORAL | Status: DC
  Administered 2017-01-17: 50 mg via ORAL
  Filled 2017-01-17: qty 1

## 2017-01-17 MED ORDER — ASPIRIN EC 81 MG PO TBEC: 81.0000 mg | DELAYED_RELEASE_TABLET | Freq: Every day | ORAL | Status: DC

## 2017-01-17 MED ORDER — GABAPENTIN 300 MG PO CAPS
300.0000 mg | ORAL_CAPSULE | Freq: Three times a day (TID) | ORAL | Status: DC
  Administered 2017-01-17: 300 mg via ORAL
  Filled 2017-01-17: qty 1

## 2017-01-17 MED ORDER — PANTOPRAZOLE SODIUM 40 MG PO TBEC
40.0000 mg | DELAYED_RELEASE_TABLET | Freq: Every day | ORAL | Status: DC
Start: 2017-01-18 — End: 2017-01-18

## 2017-01-17 MED ORDER — ATORVASTATIN CALCIUM 80 MG PO TABS
80.0000 mg | ORAL_TABLET | Freq: Every day | ORAL | Status: DC
  Administered 2017-01-17: 80 mg via ORAL
  Filled 2017-01-17: qty 1

## 2017-01-17 NOTE — ED Notes (Signed)
EMTALA finished, VS within time range, consent signed by pt

## 2017-01-17 NOTE — Progress Notes (Signed)
Patient arrived to [facility] via [place]. Alert and oriented x4. CCMD notified. Patient oriented to room. No complaints at this time will continue to monitor.

## 2017-01-17 NOTE — ED Triage Notes (Signed)
Patient reports her cardiologist sent her here to be referred to a MD for pacemaker placement. Sees Dr. Shelva Majestic. She reports he spotted instance of her HR dropping to 19 at time while wearing a cardiac monitor. Denies chest pain or SOB at this time. Reports only change in her baseline is she used to sing in the choir and she no longer can get the breath she needs to sing. Denies SOB with exertion

## 2017-01-17 NOTE — ED Notes (Signed)
Attempted to call report to receiving RN at [facility], Room 2. Receiving RN unable to take report at present and stated would call back for report.

## 2017-01-17 NOTE — ED Provider Notes (Signed)
[facility] Emergency Department Provider Note  Time seen: 4:20 PM  I have reviewed the triage vital signs and the nursing notes.   HISTORY  Chief Complaint Irregular Heart Beat    HPI Susan [name] is a 82 y.o. female with a past medical history of hypertension, hyperlipidemia, MI with several stents placed in 2014, endometrial cancer status post chemotherapy, radiation, surgery, presents to the emergency department for bradycardia.  According to the patient she recently wore a Holter monitor due to an abnormal EKG.  She states she received a phone call today from her cardiologist informing her to come to the emergency department due to a very low heart rate, sometimes as low as 19 bpm.  Patient denies any chest pain or shortness of breath.  Patient's only symptoms she has noticed over the past several weeks that she is having difficulty singing in the choir because she cannot get the air she needs to be able to do that.  Patient states mild lower extremity swelling but this is chronic/normal.  Denies any dizziness/lightheadedness.  Denies any chest pain.  Denies any dyspnea at rest.   Past Medical History:  Diagnosis Date  . Bradycardia   . Coronary artery disease   . Echoencephalogram abnormality   . Heart murmur   . History of Holter monitoring   . Hyperlipidemia   . Hypertension   . Myocardial infarction ([place])   . NSVT (nonsustained ventricular tachycardia) ([place])   . Personal history of chemotherapy    ENDOMETRIAL CA  . Personal history of radiation therapy    ENDOMETRIAL CA  . Uterine cancer [facility])    serrous    Patient Active Problem List   Diagnosis Date Noted  . Pulmonary nodules 12/05/2016  . Sacral insufficiency fracture 09/05/2016  . History of endometrial cancer 08/23/2016  . Absence of bladder continence 07/21/2015  . Neuropathy associated with cancer ([place]) 06/02/2015  . Postoperative state 04/08/2015  . Carcinoma of endometrium ([place])  04/02/2015  . Endometrial cancer ([place]) 03/24/2015  . Gastro-esophageal reflux disease without esophagitis 02/15/2015  . Osteopenia 08/17/2014  . Hyperlipidemia 02/13/2014  . Kidney stones 10/27/2013  . CA skin, basal cell 06/30/2013  . Narrowing of intervertebral disc space 06/30/2013  . Accumulation of fluid in tissues 06/30/2013  . Benign essential HTN 06/30/2013  . Calculus of kidney 06/30/2013  . Lumbar canal stenosis 06/30/2013  . Arthritis, degenerative 06/30/2013  . Neuralgia neuritis, sciatic nerve 06/30/2013  . Edema 06/30/2013  . Heart murmur 06/30/2013  . Hematuria 12/19/2012  . CAD (coronary artery disease)- elective CFX DES 10/24/12 for recurrent angina 10/25/2012  . Dyslipidemia 10/25/2012  . NSVT (nonsustained ventricular tachycardia) ([place]) 10/25/2012  . Bradycardia- tolerating low dose beta blocker 10/25/2012  . Cardiomyopathy, ischemic: EF 35-40% by echo post-MI 10/20/12 10/22/2012  . STEMI 10/19/12- LAD DES, and CFX DES 10/24/12 10/20/2012  . HTN (hypertension) 10/20/2012    Past Surgical History:  Procedure Laterality Date  . ABDOMINAL HYSTERECTOMY    . BREAST EXCISIONAL BIOPSY Right 1956   NEG  . LEFT HEART CATHETERIZATION WITH CORONARY ANGIOGRAM  10/19/2012   Procedure: LEFT HEART CATHETERIZATION WITH CORONARY ANGIOGRAM;  Surgeon: Troy Sine, MD;  Location: [facility] CATH LAB;  Service: Cardiovascular;;  . PERCUTANEOUS CORONARY STENT INTERVENTION (PCI-S)  10/19/2012   Procedure: PERCUTANEOUS CORONARY STENT INTERVENTION (PCI-S);  Surgeon: Troy Sine, MD;  Location: [facility] CATH LAB;  Service: Cardiovascular;;  . PERCUTANEOUS CORONARY STENT INTERVENTION (PCI-S) N/A 10/23/2012   Procedure: PERCUTANEOUS  CORONARY STENT INTERVENTION (PCI-S);  Surgeon: Troy Sine, MD;  Location: [facility] CATH LAB;  Service: Cardiovascular;  Laterality: N/A;  . PERIPHERAL VASCULAR CATHETERIZATION N/A 05/05/2015   Procedure: Glori Luis Cath Insertion;  Surgeon: Algernon Huxley, MD;  Location: [place]  CV LAB;  Service: Cardiovascular;  Laterality: N/A;    Prior to Admission medications   Medication Sig Start Date End Date Taking? Authorizing Provider  Ascorbic Acid (VITAMIN C) 1000 MG tablet Take 1,000 mg by mouth daily.    [provider]  aspirin EC 81 MG tablet Take 81 mg by mouth daily.    [provider]  atorvastatin (LIPITOR) 80 MG tablet TAKE 1 TABLET (80 MG TOTAL) BY MOUTH DAILY AT 6 PM. 10/02/16   Troy Sine, MD  BRILINTA 60 MG TABS tablet TAKE 1 TABLET BY MOUTH TWICE A DAY 07/17/16   Troy Sine, MD  ezetimibe (ZETIA) 10 MG tablet TAKE 1 TABLET (10 MG TOTAL) BY MOUTH DAILY. 11/16/16   Troy Sine, MD  fluticasone (FLONASE) 50 MCG/ACT nasal spray Place into the nose. 08/23/16 08/23/17  [provider]  furosemide (LASIX) 20 MG tablet TAKE 1 TABLET BY MOUTH DAILY. 11/09/16   Troy Sine, MD  gabapentin (NEURONTIN) 300 MG capsule Take 1 capsule (300 mg total) by mouth 3 (three) times daily. 11/23/16   Gillis Santa, MD  isosorbide mononitrate (IMDUR) 30 MG 24 hr tablet TAKE 1 TABLET (30 MG TOTAL) BY MOUTH DAILY. 11/16/16   Troy Sine, MD  lidocaine-prilocaine (EMLA) cream Apply to affected area once 05/06/15   Forest Gleason, MD  loperamide (IMODIUM A-D) 2 MG tablet Take 2 mg by mouth 4 (four) times daily as needed for diarrhea or loose stools.    [provider]  losartan (COZAAR) 50 MG tablet TAKE 1 TABLET BY MOUTH EVERY DAY 01/10/17   Troy Sine, MD  Multiple Vitamin (MULTIVITAMIN WITH MINERALS) TABS Take 1 tablet by mouth daily.    [provider]  NITROSTAT 0.4 MG SL tablet PLACE 1 TABLET UNDER THE TOUGUE EVERY 5 MINUTES FOR UP TO 3 DOSES AS NEEDED FOR CHEST PAIN 01/26/16   Troy Sine, MD  omeprazole (PRILOSEC) 20 MG capsule Take 20 mg by mouth daily.    [provider]  vitamin B-12 (CYANOCOBALAMIN) 1000 MCG tablet Take 1,000 mcg by mouth daily.    [provider]  Vitamin D, Cholecalciferol, 1000  UNITS CAPS Take 1 capsule by mouth daily.    [provider]    Allergies  Allergen Reactions  . Ace Inhibitors Cough  . Penicillins Rash    Family History  Problem Relation Age of Onset  . Heart disease Mother   . Breast cancer Mother 32  . Heart disease Father   . Diabetes Father   . Diabetes Brother   . Stroke Brother   . Kidney cancer Neg Hx   . Bladder Cancer Neg Hx     Social History Social History  Substance Use Topics  . Smoking status: Former Smoker    Quit date: 1976  . Smokeless tobacco: Never Used  . Alcohol use 0.6 oz/week    1 Glasses of wine per week    Review of Systems Constitutional: Negative for fever. Cardiovascular: Negative for chest pain.  Positive for bradycardia but no symptoms per patient. Respiratory: Short of breath when attempting to sing. Gastrointestinal: Negative for abdominal pain Musculoskeletal: Mild leg swelling which is chronic.  Denies any increased  recently. Neurological: Negative for headache All other ROS negative  ____________________________________________   PHYSICAL EXAM:  VITAL SIGNS: ED Triage Vitals  Enc Vitals Group     BP 01/17/17 1438 (!) 148/68     Pulse Rate 01/17/17 1438 60     Resp 01/17/17 1438 18     Temp 01/17/17 1438 98 F (36.7 C)     Temp Source 01/17/17 1438 Oral     SpO2 01/17/17 1438 96 %     Weight 01/17/17 1440 208 lb (94.3 kg)     Height 01/17/17 1440 5\' 4"  (1.626 m)     Head Circumference --      Peak Flow --      Pain Score 01/17/17 1436 3     Pain Loc --      Pain Edu? --      Excl. in [place]? --     Constitutional: Alert and oriented. Well appearing and in no distress. Eyes: Normal exam ENT   Head: Normocephalic and atraumatic.   Mouth/Throat: Mucous membranes are moist. Cardiovascular: Normal rate, regular rhythm.  2/6 systolic murmur. Respiratory: Normal respiratory effort without tachypnea nor retractions. Breath sounds are clear  Gastrointestinal: Soft and  nontender. No distention.   Musculoskeletal: Nontender with normal range of motion in all extremities.  Mild lower extremity edema bilaterally.  No calf tenderness. Neurologic:  Normal speech and language. No gross focal neurologic deficits  Skin:  Skin is warm, dry and intact.  Psychiatric: Mood and affect are normal.   ____________________________________________    EKG  EKG reviewed and interpreted by myself appears to show a sinus versus junctional rhythm at 59 bpm with a widened QRS, largely normal intervals, left axis deviation.  Most consistent with a left bundle branch block, history of the same.  ____________________________________________    RADIOLOGY  X-ray normal  ____________________________________________   INITIAL IMPRESSION / ASSESSMENT AND PLAN / ED COURSE  Pertinent labs & imaging results that were available during my care of the patient were reviewed by me and considered in my medical decision making (see chart for details).  Patient presents to the emergency department with bradycardia.  Currently has a heart rate around 60 bpm, is asymptomatic.  Differential at this time would include bradycardia, incorrect Holter reading, ACS, sick sinus syndrome.  Patient's labs are largely within normal limits including a negative troponin.  EKG appears to show a sinus versus junctional rhythm at 59 bpm.  Other vitals are normal including blood pressure.  We will discuss with cardiology for further recommendations.  We will monitor on telemetry in the emergency department.  I discussed the patient with cardiology, initially they thought the patient would be safe for discharge home to follow-up in the office.  However after the on-call cardiologist spoke to Dr. Claiborne Billings if he now would like the patient transferred to [facility] for pacemaker placement.  I discussed this with the patient and her husband, they are agreeable to this plan.  We will arrange transfer for the  patient.  I reviewed the patient's records including her telephone encounter from today informing her of the Holter monitor findings.  We will arrange transfer to [facility].  ____________________________________________   FINAL CLINICAL IMPRESSION(S) / ED DIAGNOSES  Bradycardia    Harvest Dark, MD 01/17/17 1810

## 2017-01-17 NOTE — Telephone Encounter (Signed)
I received a phone call from [place].  This patient was told by Dr. Claiborne Billings to come to the ED for admission, EP consult and pacing.  However, he assumed that she would come to [facility].  He wants her to be admitted to [facility].  I spoke with the ED MD and Dr. Claiborne Billings.  She is to be transferred to [facility] for admission.

## 2017-01-17 NOTE — ED Notes (Signed)
Report given to Verdie Drown, Therapist, sports at [facility] for transport.

## 2017-01-17 NOTE — H&P (Signed)
History & Physical    Patient ID: LAILANY ENOCH MRN: [id number], DOB/AGE: 80-Feb-1936   Admit date: 01/17/2017   Primary Physician: [name], MD Primary Cardiologist: Dr. Claiborne Billings  Patient Profile    Susan [name] is a 82 y.o. female with past medical history of CAD (s/p STEMI in 2014 with DES to LAD with staged PCI/DES placement to LCx), ischemic cardiomyopathy (EF 40-45%), NSVT, HTN, HLD, OSA (on CPAP) and Endometrial Cancer who presents to [facility] as a transfer from [facility] for significant bradycardia.   History of Present Illness    She was last examined by Dr. Claiborne Billings on 12/27/2016 and was found to be bradycardiac with HR in the 40's. She was not on any AV nodal blocking agents, therefore she was placed on a 48-hour Holter monitor. Her monitor was placed and resulted showing: "Markedly abnormal monitor demonstrating predominantly underlying atrial fibrillation with slow ventricular rate. The average heart beat was 53 bpm.  The maximum heart rate was 109 bpm.  During the monitoring period, the patient had significant bradycardia with heart rates down in the mid to low 30s.  There were 2 episodes with heart rates below 30 at 25 bpm and at 19 bpm immediately thereafter at 9:57 PM on 01/10/2017.  On 01/11/2017 at 7:47 AM heart rate was 24 bpm. .  The patient is not on any AV nodal blocking agents.  The patient will be advised to go to the emergency room for evaluation and need for pacemaker.  There were several PVCs, with couplets, bigeminy and trigeminy and a possible 4 beat run of NSVT."  In talking with the patient today, she denies any recent symptoms while her monitor was in place or since then. No recent chest pain, palpitations, dyspnea, lightheadedness, dizziness, presyncope, or syncope. Her last dose of Brilinta was this morning.   Initial labs at [facility] showed WBC of 6.3, Hgb 13.0, platelets 233, Na+ 140, K+ 3.8, and creatinine 1.02. Initial troponin negative. EKG shows  sinus bradycardia with 1st degree AV Block, HR 59, with known LBBB.   Past Medical History    Past Medical History:  Diagnosis Date  . Bradycardia   . Coronary artery disease    a. s/p STEMI in 2014 with DES to LAD with staged PCI/DES placement to LCx  . Echoencephalogram abnormality   . Heart murmur   . History of Holter monitoring   . Hyperlipidemia   . Hypertension   . Myocardial infarction ([place])   . NSVT (nonsustained ventricular tachycardia) ([place])   . Personal history of chemotherapy    ENDOMETRIAL CA  . Personal history of radiation therapy    ENDOMETRIAL CA  . Uterine cancer ([place])    serrous    Past Surgical History:  Procedure Laterality Date  . ABDOMINAL HYSTERECTOMY    . BREAST EXCISIONAL BIOPSY Right 1956   NEG  . LEFT HEART CATHETERIZATION WITH CORONARY ANGIOGRAM  10/19/2012   Procedure: LEFT HEART CATHETERIZATION WITH CORONARY ANGIOGRAM;  Surgeon: Troy Sine, MD;  Location: [facility] CATH LAB;  Service: Cardiovascular;;  . PERCUTANEOUS CORONARY STENT INTERVENTION (PCI-S)  10/19/2012   Procedure: PERCUTANEOUS CORONARY STENT INTERVENTION (PCI-S);  Surgeon: Troy Sine, MD;  Location: [facility] CATH LAB;  Service: Cardiovascular;;  . PERCUTANEOUS CORONARY STENT INTERVENTION (PCI-S) N/A 10/23/2012   Procedure: PERCUTANEOUS CORONARY STENT INTERVENTION (PCI-S);  Surgeon: Troy Sine, MD;  Location: [facility] CATH LAB;  Service: Cardiovascular;  Laterality: N/A;  . PERIPHERAL VASCULAR CATHETERIZATION N/A 05/05/2015  Procedure: Porta Cath Insertion;  Surgeon: Algernon Huxley, MD;  Location: [facility] CV LAB;  Service: Cardiovascular;  Laterality: N/A;     Allergies  Allergies  Allergen Reactions  . Ace Inhibitors Cough  . Penicillins Rash    Has patient had a PCN reaction causing immediate rash, facial/tongue/throat swelling, SOB or lightheadedness with hypotension: No Has patient had a PCN reaction causing severe rash involving mucus membranes or skin necrosis: No Has patient  had a PCN reaction that required hospitalization: No Has patient had a PCN reaction occurring within the last 10 years: No If all of the above answers are "NO", then may proceed with Cephalosporin use.      Home Medications    Prior to Admission medications   Medication Sig Start Date End Date Taking? Authorizing Provider  Ascorbic Acid (VITAMIN C) 1000 MG tablet Take 1,000 mg by mouth daily.    [provider]  aspirin EC 81 MG tablet Take 81 mg by mouth daily.    [provider]  atorvastatin (LIPITOR) 80 MG tablet TAKE 1 TABLET (80 MG TOTAL) BY MOUTH DAILY AT 6 PM. 10/02/16   Troy Sine, MD  BRILINTA 60 MG TABS tablet TAKE 1 TABLET BY MOUTH TWICE A DAY 07/17/16   Troy Sine, MD  ezetimibe (ZETIA) 10 MG tablet TAKE 1 TABLET (10 MG TOTAL) BY MOUTH DAILY. 11/16/16   Troy Sine, MD  fluticasone (FLONASE) 50 MCG/ACT nasal spray Place 2 sprays into both nostrils daily.  08/23/16 08/23/17  [provider]  furosemide (LASIX) 20 MG tablet TAKE 1 TABLET BY MOUTH DAILY. 11/09/16   Troy Sine, MD  gabapentin (NEURONTIN) 300 MG capsule Take 1 capsule (300 mg total) by mouth 3 (three) times daily. 11/23/16   Gillis Santa, MD  isosorbide mononitrate (IMDUR) 30 MG 24 hr tablet TAKE 1 TABLET (30 MG TOTAL) BY MOUTH DAILY. 11/16/16   Troy Sine, MD  lidocaine-prilocaine (EMLA) cream Apply to affected area once 05/06/15   Forest Gleason, MD  loperamide (IMODIUM A-D) 2 MG tablet Take 2 mg by mouth 4 (four) times daily as needed for diarrhea or loose stools.    [provider]  losartan (COZAAR) 50 MG tablet TAKE 1 TABLET BY MOUTH EVERY DAY 01/10/17   Troy Sine, MD  Multiple Vitamin (MULTIVITAMIN WITH MINERALS) TABS Take 1 tablet by mouth daily.    [provider]  NITROSTAT 0.4 MG SL tablet PLACE 1 TABLET UNDER THE TOUGUE EVERY 5 MINUTES FOR UP TO 3 DOSES AS NEEDED FOR CHEST PAIN 01/26/16   Troy Sine, MD  omeprazole (PRILOSEC) 20 MG  capsule Take 20 mg by mouth daily.    [provider]  vitamin B-12 (CYANOCOBALAMIN) 1000 MCG tablet Take 1,000 mcg by mouth daily.    [provider]  Vitamin D, Cholecalciferol, 1000 UNITS CAPS Take 1 capsule by mouth daily.    [provider]    Family History    Family History  Problem Relation Age of Onset  . Heart disease Mother   . Breast cancer Mother 68  . Heart disease Father   . Diabetes Father   . Diabetes Brother   . Stroke Brother   . Kidney cancer Neg Hx   . Bladder Cancer Neg Hx     Social History    Social History   Social History  . Marital status: Married    Spouse name: N/A  . Number of children:  N/A  . Years of education: N/A   Occupational History  . Not on file.   Social History Main Topics  . Smoking status: Former Smoker    Quit date: 1976  . Smokeless tobacco: Never Used  . Alcohol use 0.6 oz/week    1 Glasses of wine per week  . Drug use: No  . Sexual activity: Yes   Other Topics Concern  . Not on file   Social History Narrative  . No narrative on file     Review of Systems    General:  No chills, fever, night sweats or weight changes.  Cardiovascular:  No chest pain, dyspnea on exertion, edema, orthopnea, palpitations, paroxysmal nocturnal dyspnea. Dermatological: No rash, lesions/masses Respiratory: No cough, dyspnea Urologic: No hematuria, dysuria MSK: Positive for back pain.  Abdominal:   No nausea, vomiting, diarrhea, bright red blood per rectum, melena, or hematemesis Neurologic:  No visual changes, wkns, changes in mental status.  All other systems reviewed and are otherwise negative except as noted above.  Physical Exam    Blood pressure (!) 151/61, pulse (!) 53, temperature 97.7 F (36.5 C), temperature source Oral, resp. rate 18, height 5\' 4"  (1.626 m), weight 202 lb 3.2 oz (91.7 kg), SpO2 97 %.  General: Well developed, well nourished Caucasian female appearing in no acute  distress. Head: Normocephalic, atraumatic, sclera non-icteric, no xanthomas, nares are without discharge. Neck: No carotid bruits. JVD not elevated.  Lungs: Respirations regular and unlabored, without wheezes or rales.  Heart: Regular rate and rhythm. No S3 or S4.  No murmur, no rubs, or gallops appreciated. Abdomen: Soft, non-tender, non-distended with normoactive bowel sounds. No hepatomegaly. No rebound/guarding. No obvious abdominal masses. Msk:  Strength and tone appear normal for age. No joint deformities or effusions. Extremities: No clubbing or cyanosis. No edema.  Distal pedal pulses are 2+ bilaterally. Neuro: Alert and oriented X 3. Moves all extremities spontaneously. No focal deficits noted. Psych:  Responds to questions appropriately with a normal affect. Skin: No rashes or lesions noted  Labs    Troponin [facility] of [facility] Test)  Recent Labs  01/17/17 1446  TROPONINI <0.03   Lab Results  Component Value Date   WBC 6.3 01/17/2017   HGB 13.0 01/17/2017   HCT 38.7 01/17/2017   MCV 91.0 01/17/2017   PLT 233 01/17/2017     Recent Labs Lab 01/17/17 1446  NA 140  K 3.8  CL 104  CO2 26  BUN 17  CREATININE 1.02*  CALCIUM 9.8  GLUCOSE 102*   Lab Results  Component Value Date   CHOL 120 06/19/2016   HDL 54 06/19/2016   LDLCALC 46 06/19/2016   TRIG 99 06/19/2016     Pro B Natriuretic peptide (BNP)  Date/Time Value Ref Range Status  10/23/2012 04:20 AM 1,918.0 (H) 0 - 450 pg/mL Final  10/19/2012 11:50 PM 1,299.0 (H) 0 - 450 pg/mL Final     Radiology Studies    Dg Chest 2 View  Result Date: 01/17/2017 CLINICAL DATA:  Bradycardia. EXAM: CHEST  2 VIEW COMPARISON:  10/19/2012 FINDINGS: Heart size upper limits of normal. Coronary artery stents evident in the left system. Mild tortuosity of the aorta. The pulmonary vascularity is normal. Lungs are clear. No effusions. Power port inserted from a right jugular approach with tip in the SVC 3 cm above the right  atrium. Chronic spinal curvature. IMPRESSION: No active disease. Coronary artery stents on the left. Power port in place. Electronically Signed  By: Nelson Chimes M.D.   On: 01/17/2017 16:09   Dg C-arm 1-60 Min-no Report  Result Date: 01/03/2017 Fluoroscopy was utilized by the requesting physician.  No radiographic interpretation.    EKG & Cardiac Imaging    EKG: Sinus bradycardia with 1st degree AV Block, HR 59, with known LBBB.  - Personally Reviewed  ECHOCARDIOGRAM: 12/2016 Study Conclusions  - Left ventricle: The cavity size was normal. There was moderate   concentric hypertrophy. Systolic function was mildly to   moderately reduced. The estimated ejection fraction was in the   range of 40% to 45%. Severe hypokinesis of the inferior,   inferoseptal, and apical myocardium. Mild anteroseptal and   inferolateral hypokinesis. Doppler parameters are consistent with   abnormal left ventricular relaxation (grade 1 diastolic   dysfunction). Doppler parameters are consistent with high   ventricular filling pressure. - Aortic valve: There was mild stenosis. Peak velocity (S): 238   cm/s. Mean gradient (S): 12 mm Hg. Valve area (VTI): 2.7 cm^2.   Valve area (Vmax): 2.18 cm^2. Valve area (Vmean): 2.39 cm^2. - Mitral valve: Calcified annulus. Severe thickening and   calcification. Transvalvular velocity was within the normal   range. There was no evidence for stenosis. There was mild   regurgitation. Valve area by continuity equation (using LVOT   flow): 3.08 cm^2. - Left atrium: The atrium was severely dilated. - Right ventricle: The cavity size was normal. Wall thickness was   normal. Systolic function was normal. - Tricuspid valve: There was no regurgitation. - Pulmonary arteries: Systolic pressure was within the normal   range. PA peak pressure: 32 mm Hg (S). - Pericardium, extracardiac: A trivial pericardial effusion was   identified.  Impressions:  - The anterior leaflet of  the mitral valve is very thickened   calcified. Mitral valve thickening was also noted in the echo   09/2012, but these images are unavailable for comparison.  Assessment & Plan   1. Bradycardia - recently evaluated in the office and noted to be bradycardiac with HR in the 40's. Holter monitor was placed and showed a predominantly underlying atrial fibrillation with slow ventricular rate. The average heart beat was 53 bpm. There were 2 episodes with heart rates below 30 at 25 bpm and at 19 bpm on 01/10/2017. - she denies any recent symptoms while her monitor was in place or since then. No recent chest pain, palpitations, dyspnea, lightheadedness, dizziness, presyncope, or syncope.  - will monitor on telemetry overnight. Plan for EP consult in the AM. NPO after midnight.   2. CAD - s/p STEMI in 2014 with DES to LAD with staged PCI/DES placement to LCx. - she denies any recent anginal symptoms.  - continue statin and Imdur. Will hold Brilinta with anticipation of PPM placement as above.    3. Ischemic Cardiomyopathy - EF 40-45% by recent echocardiogram.  - she denies any recent orthopnea, PND or lower extremity edema. Does not appear volume overloaded by physical examination.  - continue Losartan. No BB due to bradycardia.   4. HTN - continue current medication regimen.    5. History of Endometrial Cancer - has completed treatment. Followed by Oncology.   6. OSA - continue CPAP.   Signed, Susan Heritage, PA-C 01/17/2017, 8:00 PM Pager: [phone]  Patient seen and examined.  She was transferred from [facility] for consideration of a pacemaker.  She had a routine office visit on October 3 and was noted to have sinus bradycardia.  She  had a Holter monitor that was placed on the 17th and she had 2 episodes of 25 beats and 19 bpm around 10:00 on October 17.  An early morning heart rate on the 18th was 24 bpm.  There was also thought to be some atrial fibrillation.   Dr. Claiborne Billings got the report today and advised her to go to the emergency room and she went to [facility] instead of here and was transferred by ambulance from here.  The patient has not had any syncope and has not had any dizziness or other cardiovascular symptoms.  Her major symptoms or urinary incontinence as well as back pain for which she has been receiving intermittent epidural injections.  She doesn't have any PND, orthopnea or claudication.  She was started on Lasix recently.  Noted above completely reviewed and patient examined.  Her examination is remarkable for clear lungs, her neck veins are flat, there is a 2/6 systolic murmur at the left sternal border.  Abdomen is soft and nontender.  No edema today.   Impressions:  1.  Stable coronary artery disease 2.  Sick sinus syndrome with severe bradycardia but not symptomatic 3.  Obesity  Recommendations:  Chart reviewed.  Her Brilinta will be held at the present time.  She will have an electrophysiology consult and possible pacemaker placement.  Kerry Hough MD [facility] 8:31 PM

## 2017-01-17 NOTE — Telephone Encounter (Signed)
Spoke with patient and recommendations given, verbalized understanding   Study Highlights   Markedly abnormal monitor demonstrating predominantly underlying atrial fibrillation with slow ventricular rate.  The average heart beat was 53 bpm.  The maximum heart rate was 109 bpm.  During the monitoring period, the patient had significant bradycardia with heart rates down in the mid to low 30s.  Th there were 2 episodes with heart rates below 30 at 25 bpm  and at 19 bpm immediately thereafter at 9:57 PM on 01/10/2017.  On 01/11/2017 at 7:47 AM heart rate was 24 bpm. .  The patient is not on any AV nodal blocking agents.  The patient will be advised to go to the emergency room for evaluation and need for pacemaker.  There were several PVCs, with couplets, bigeminy and trigeminy and a possible 4 beat run of NSVT.  Signed   Electronically signed by Troy Sine, MD on 01/17/17 at 1202 EDT  Report approved and finalized on 01/17/2017 1202

## 2017-01-18 ENCOUNTER — Ambulatory Visit: Payer: Medicare Other | Admitting: Student in an Organized Health Care Education/Training Program

## 2017-01-18 DIAGNOSIS — R001 Bradycardia, unspecified: Secondary | ICD-10-CM | POA: Diagnosis present

## 2017-01-18 DIAGNOSIS — I495 Sick sinus syndrome: Secondary | ICD-10-CM | POA: Diagnosis not present

## 2017-01-18 NOTE — Progress Notes (Signed)
Reviewed discharge instructions with patient and patient's husband.  Answered their questions. Pt is asymptomatic and ready for discharge. Will follow up with her doctors.

## 2017-01-18 NOTE — Progress Notes (Signed)
Notified Dr. Parks Ranger pt had a pause. Dr. Reviewed, will continue to monitor pt.

## 2017-01-18 NOTE — Care Management CC44 (Signed)
Condition Code 44 Documentation Completed  Patient Details  Name: Susan [name] MRN: [id number] Date of Birth: [birth year]   Condition Code 44 given:  Yes Patient signature on Condition Code 44 notice:  Yes Documentation of 2 MD's agreement:  Yes Code 44 added to claim:  Yes    Royston Bake, RN 01/18/2017, 2:18 PM

## 2017-01-18 NOTE — Consult Note (Signed)
Cardiology Consultation:   Patient ID: Susan [name]; [id number]; [birth year]   Admit date: 01/17/2017 Date of Consult: 01/18/2017  Primary Care Provider: [name], MD Primary Cardiologist: Dr. Claiborne Billings    Patient Profile:   Susan [name] is a 82 y.o. female with a hx of CAD with prior intervention after STEMI to LAD in 2014, and staged to cx, ICM, NSVT, HTN, HLD, OSA w/CPAP, endometrial cancer completed tx who is being seen today for the evaluation of bradycardia at the request of Dr. Wynonia Lawman.  History of Present Illness:   Susan [name] out patient was found to be bradycardic with HR 40's and planned for EM given not on any kind rate limiting/nodal blocking agents.  Bradycardia is not new for her, she was evaluated previously by [name] back in 2016, an EM at that time noted NSVT rates 50's with primarily nocturnal rates into the 30's, and given no symptoms, no pacing was recommended.  At a routine f/u with Dr. Claiborne Billings earlier this month he found her w/SB rate 43 and had her wear a monitor 2/2 this, there were no noted symptoms.  The event monitor noted significant bradycardic rates, with pauses, the chart read: "Markedly abnormal monitor demonstrating predominantly underlying atrial fibrillation with slow ventricular rate. The average heart beat was 53 bpm. The maximum heart rate was 109 bpm. During the monitoring period, the patient had significant bradycardia with heart rates down in the mid to low 30s. There were 2 episodes with heart rates below 30 at 25 bpm and at 19 bpm immediately thereafter at 9:57 PM on 01/10/2017. On 01/11/2017 at 7:47 AM heart rate was 24 bpm. . The patient is not on any AV nodal blocking agents. The patient Susan [name] be advised to go to the emergency room for evaluation and need for pacemaker. There were several PVCs, with couplets, bigeminy and trigeminy and a possible 4 beat run of NSVT."  Given this the patient was referred emergently to the  hospital, she was initially at [facility], labs showed WBC of 6.3, Hgb 13.0, platelets 233, Na+ 140, K+ 3.8, and creatinine 1.02. Initial troponin negative. EKG shows sinus bradycardia with 1st degree AV Block, HR 59, with known LBBB.  She was transferred to [facility] for EP evaluation.  Outside of some chronic back pain the patient denies any kind of symptoms, no limitations to her ability to do her ADLs or the activities she would like to do, no fatigue/weakness, no dizzy spells, near syncope or syncope was able to be elicited from the patient after much conversation with the patient and [name].   Past Medical History:  Diagnosis Date  . Bradycardia   . Coronary artery disease    a. s/p STEMI in 2014 with DES to LAD with staged PCI/DES placement to LCx  . Echoencephalogram abnormality   . Heart murmur   . History of Holter monitoring   . Hyperlipidemia   . Hypertension   . Myocardial infarction ([place])   . NSVT (nonsustained ventricular tachycardia) ([place])   . Personal history of chemotherapy    ENDOMETRIAL CA  . Personal history of radiation therapy    ENDOMETRIAL CA  . Uterine cancer ([place])    serrous    Past Surgical History:  Procedure Laterality Date  . ABDOMINAL HYSTERECTOMY    . BREAST EXCISIONAL BIOPSY Right 1956   NEG  . LEFT HEART CATHETERIZATION WITH CORONARY ANGIOGRAM  10/19/2012   Procedure: LEFT HEART CATHETERIZATION WITH CORONARY ANGIOGRAM;  Surgeon: Marcello Moores  Floyce Stakes, MD;  Location: [facility] CATH LAB;  Service: Cardiovascular;;  . PERCUTANEOUS CORONARY STENT INTERVENTION (PCI-S)  10/19/2012   Procedure: PERCUTANEOUS CORONARY STENT INTERVENTION (PCI-S);  Surgeon: Troy Sine, MD;  Location: [facility] CATH LAB;  Service: Cardiovascular;;  . PERCUTANEOUS CORONARY STENT INTERVENTION (PCI-S) N/A 10/23/2012   Procedure: PERCUTANEOUS CORONARY STENT INTERVENTION (PCI-S);  Surgeon: Troy Sine, MD;  Location[facility] CATH LAB;  Service: Cardiovascular;  Laterality: N/A;  . PERIPHERAL VASCULAR  CATHETERIZATION N/A 05/05/2015   Procedure: Glori Luis Cath Insertion;  Surgeon: Algernon Huxley, MD;  Location: [place] CV LAB;  Service: Cardiovascular;  Laterality: N/A;       Inpatient Medications: Scheduled Meds: . aspirin EC  81 mg Oral Daily  . atorvastatin  80 mg Oral q1800  . enoxaparin (LOVENOX) injection  40 mg Subcutaneous Q24H  . ezetimibe  10 mg Oral Daily  . gabapentin  300 mg Oral TID  . isosorbide mononitrate  30 mg Oral Daily  . losartan  50 mg Oral Daily  . pantoprazole  40 mg Oral Daily   Continuous Infusions:  PRN Meds:   Allergies:    Allergies  Allergen Reactions  . Ace Inhibitors Cough  . Penicillins Rash    Has patient had a PCN reaction causing immediate rash, facial/tongue/throat swelling, SOB or lightheadedness with hypotension: No Has patient had a PCN reaction causing severe rash involving mucus membranes or skin necrosis: No Has patient had a PCN reaction that required hospitalization: No Has patient had a PCN reaction occurring within the last 10 years: No If all of the above answers are "NO", then may proceed with Cephalosporin use.     Social History:   Social History   Social History  . Marital status: Married    Spouse name: N/A  . Number of children: N/A  . Years of education: N/A   Occupational History  . Not on file.   Social History Main Topics  . Smoking status: Former Smoker    Quit date: 1976  . Smokeless tobacco: Never Used  . Alcohol use 0.6 oz/week    1 Glasses of wine per week  . Drug use: No  . Sexual activity: Yes   Other Topics Concern  . Not on file   Social History Narrative  . No narrative on file    Family History:   Family History  Problem Relation Age of Onset  . Heart disease Mother   . Breast cancer Mother 35  . Heart disease Father   . Diabetes Father   . Diabetes Brother   . Stroke Brother   . Kidney cancer Neg Hx   . Bladder Cancer Neg Hx      ROS:  Please see the history of present  illness.  ROS  All other ROS reviewed and negative.     Physical Exam/Data:   Vitals:   01/17/17 1933 01/17/17 2353 01/18/17 0534 01/18/17 1158  BP: (!) 151/61 (!) 134/56 (!) 125/44 (!) 135/49  Pulse: (!) 53 (!) 50 (!) 55 (!) 50  Resp: 18 18 18 20   Temp: 97.7 F (36.5 C)  98.5 F (36.9 C) 98.2 F (36.8 C)  TempSrc: Oral  Oral Oral  SpO2: 97% 98% 94% 97%  Weight: 202 lb 3.2 oz (91.7 kg)  200 lb 8 oz (90.9 kg)   Height: 5\' 4"  (1.626 m)       Intake/Output Summary (Last 24 hours) at 01/18/17 1249 Last data filed at 01/18/17 [id number]  Gross per 24 hour  Intake                0 ml  Output              900 ml  Net             -900 ml   Filed Weights   01/17/17 1933 01/18/17 0534  Weight: 202 lb 3.2 oz (91.7 kg) 200 lb 8 oz (90.9 kg)   Body mass index is 34.42 kg/m.  General:  Well nourished, well developed, in no acute distress HEENT: normal Lymph: no adenopathy Neck: no JVD Endocrine:  No thryomegaly Vascular: No carotid bruits Cardiac: RRR; soft SM, no gallops or rubs Lungs:  CTA b/l, no wheezing, rhonchi or rales  Abd: soft, nontender Ext: no edema Musculoskeletal:  No deformities, BUE and BLE strength normal and equal Skin: warm and dry  Neuro:  No gross focal abnormalities noted Psych:  Normal affect   EKG:  The EKG was personally reviewed and demonstrates:   SB 59bpm, LBBB Telemetry:  Telemetry was personally reviewed with [name] and demonstrates:   SB 40's-50's 2-3 second pauses intermittently  Relevant CV Studies:  ECHOCARDIOGRAM: 12/2016 Study Conclusions - Left ventricle: The cavity size was normal. There was moderate concentric hypertrophy. Systolic function was mildly to moderately reduced. The estimated ejection fraction was in the range of 40% to 45%. Severe hypokinesis of the inferior, inferoseptal, and apical myocardium. Mild anteroseptal and inferolateral hypokinesis. Doppler parameters are consistent with abnormal left  ventricular relaxation (grade 1 diastolic dysfunction). Doppler parameters are consistent with high ventricular filling pressure. - Aortic valve: There was mild stenosis. Peak velocity (S): 238 cm/s. Mean gradient (S): 12 mm Hg. Valve area (VTI): 2.7 cm^2. Valve area (Vmax): 2.18 cm^2. Valve area (Vmean): 2.39 cm^2. - Mitral valve: Calcified annulus. Severe thickening and calcification. Transvalvular velocity was within the normal range. There was no evidence for stenosis. There was mild regurgitation. Valve area by continuity equation (using LVOT flow): 3.08 cm^2. - Left atrium: The atrium was severely dilated. - Right ventricle: The cavity size was normal. Wall thickness was normal. Systolic function was normal. - Tricuspid valve: There was no regurgitation. - Pulmonary arteries: Systolic pressure was within the normal range. PA peak pressure: 32 mm Hg (S). - Pericardium, extracardiac: A trivial pericardial effusion was identified. Impressions: - The anterior leaflet of the mitral valve is very thickened calcified. Mitral valve thickening was also noted in the echo 09/2012, but these images are unavailable for comparison.  Laboratory Data:  Chemistry Recent Labs Lab 01/17/17 1446  NA 140  K 3.8  CL 104  CO2 26  GLUCOSE 102*  BUN 17  CREATININE 1.02*  CALCIUM 9.8  GFRNONAA 51*  GFRAA 59*  ANIONGAP 10    No results for input(s): PROT, ALBUMIN, AST, ALT, ALKPHOS, BILITOT in the last 168 hours. Hematology Recent Labs Lab 01/17/17 1446  WBC 6.3  RBC 4.26  HGB 13.0  HCT 38.7  MCV 91.0  MCH 30.7  MCHC 33.7  RDW 14.3  PLT 233   Cardiac Enzymes Recent Labs Lab 01/17/17 1446  TROPONINI <0.03   No results for input(s): TROPIPOC in the last 168 hours.  BNPNo results for input(s): BNP, PROBNP in the last 168 hours.  DDimer No results for input(s): DDIMER in the last 168 hours.  Radiology/Studies:   Dg Chest 2 View Result Date:  01/17/2017 CLINICAL DATA:  Bradycardia. EXAM: CHEST  2 VIEW COMPARISON:  10/19/2012 FINDINGS: Heart size upper limits of normal. Coronary artery stents evident in the left system. Mild tortuosity of the aorta. The pulmonary vascularity is normal. Lungs are clear. No effusions. Power port inserted from a right jugular approach with tip in the SVC 3 cm above the right atrium. Chronic spinal curvature. IMPRESSION: No active disease. Coronary artery stents on the left. Power port in place. Electronically Signed   By: Nelson Chimes M.D.   On: 01/17/2017 16:09    Assessment and Plan:   1. Asymptomatic bradycardia     [name] has reviewed the patient event monitor tracings, EKG, and telemetry     NO AF was appreciated     SB, at times brief Mobitz I, transient rates to 20's with the pauses, junctional escape beats.     Telemetry has been generally SB 40's-50's, 2-3 second pauses intermittently, as well as HR 60's-80's with activity     BP is stable, patient has remained asymptomatic  [name] discussed the findings with the patient and her husband at bedside, given complete lack of any kind of symptoms there is no indication for pacing.  The patinet understands, and is comfortable proceeding without pacing at this time.  Discussed at some point likely Tomeeka Plaugher need pacing support though unknown how long that Cully Luckow be.  She would like to hold off on pacing and keep close follow up.       For questions or updates, please contact [place] Please consult [email or web address] for contact info under Cardiology/STEMI.   Signed, Baldwin Jamaica, PA-C  01/18/2017 12:49 PM  I have seen and examined this patient with Tommye Standard.  Agree with above, note added to reflect my findings.  On exam, RRR, no murmurs, lungs clear. Presented to the hospital after being found to have bradycardia. On review, the patient has had no dizziness, SOB, PND, orthopnea, syncope, or near syncope. Her HR has been in the 20s for  short spells with sinus pauses and a likely junctional escape. Would continue to monitor without pacemaker implant at this time. Hurbert Duran arrange close office follow up.    Taygan Connell M. Jaquavian Firkus MD 01/18/2017 1:53 PM

## 2017-01-18 NOTE — Discharge Summary (Signed)
ELECTROPHYSIOLOGY PROCEDURE DISCHARGE SUMMARY    Susan [name] ID: Susan Susan [name],  MRN: [id number], DOB/AGE: 05-06-1934 82 y.o.  Admit date: 01/17/2017 Discharge date: 01/18/2017  Primary Care Physician: Susan Pitch, MD  Primary Cardiologist: Dr. Claiborne Billings Electrophysiologist: [name]  Primary Discharge Diagnosis:  1. Asymptomatic bradycardia  Secondary Discharge Diagnosis:  1. CAD 2. ICM     Compensated 3. HTN 4. OSA w/CPAP  Allergies  Allergen Reactions  . Ace Inhibitors Cough  . Penicillins Rash    Has Susan [name] had a PCN reaction causing immediate rash, facial/tongue/throat swelling, SOB or lightheadedness with hypotension: No Has Susan [name] had a PCN reaction causing severe rash involving mucus membranes or skin necrosis: No Has Susan [name] had a PCN reaction that required hospitalization: No Has Susan [name] had a PCN reaction occurring within Susan last 10 years: No If all of Susan above answers are "NO", then may proceed with Cephalosporin use.      Procedures This Admission:  None  Brief HPI: Susan Susan [name] is a 82 y.o. female with PMHx as above was referred to Susan ER 2?2 abnormal event monitor findings.  Initially to [facility].   Hospital Course:  Susan Susan [name] out Susan [name] was found to Susan [name] bradycardic with HR 40's and planned for EM given not on any kind rate limiting/nodal blocking agents.  Bradycardia is not new for her, she was evaluated previously by [name] back in 2016, an EM at that time noted NSVT rates 50's with primarily nocturnal rates into Susan 30's, and given no symptoms, no pacing was recommended.  At a routine f/u with Dr. Claiborne Billings earlier this month he found her w/SB rate 43 and had her wear a monitor 2/2 this, there were no noted symptoms.  Susan event monitor noted significant bradycardic rates, with pauses, Susan chart read: "Markedly abnormal monitor demonstrating predominantly underlying atrial fibrillation with slow ventricular rate. Susan average heart beat  was 53 bpm. Susan maximum heart rate was 109 bpm. During Susan monitoring period, Susan Susan [name] had significant bradycardia with heart rates down in Susan mid to low 30s. There were 2 episodes with heart rates below 30 at 25 bpm and at 19 bpm immediately thereafter at 9:57 PM on 01/10/2017. On 01/11/2017 at 7:47 AM heart rate was 24 bpm. . Susan Susan [name] is not on any AV nodal blocking agents. Susan Susan [name] Susan Susan [name] advised to go to Susan emergency room for evaluation and need for pacemaker. There were several PVCs, with couplets, bigeminy and trigeminy and a possible 4 beat run of NSVT."  Given this Susan Susan [name] was referred emergently to Susan hospital, she was initially at [facility], labs showed WBC of 6.3, Hgb 13.0, platelets 233, Na+ 140, K+ 3.8, and creatinine 1.02. Initial troponin negative. EKG shows sinus bradycardia with 1st degree AV Block, HR 59, with known LBBB.  She was transferred to [facility] for EP evaluation.  Outside of some chronic back pain Susan Susan [name] denies any kind of symptoms, no limitations to her ability to do her ADLs or Susan activities she would like to do, no fatigue/weakness, no dizzy spells, near syncope or syncope was able to Susan [name] elicited from Susan Susan [name] after much conversation with Susan Susan [name] and [name].  Her BP remained stable and Susan Susan [name] remained without symptoms, noting with activity HR did increase to 60's-80's   [name] has reviewed Susan Susan [name] event monitor tracings, EKG, and telemetry, NO AF was appreciated, SB, at times brief Mobitz I, transient rates to 20's with Susan pauses,  junctional escape beats. Telemetry has been generally SB 40's-50's 2-3 second pauses intermittently.  [name] discussed Susan findings with Susan Susan [name] and her husband at bedside, given complete lack of any kind of symptoms there is no indication for pacing.  Susan Susan Susan [name] understands, and is comfortable proceeding without pacing at this time.  Discussed at some point likely Susan Susan [name] need pacing support  though unknown how long that Susan Susan [name] Susan [name].  She would like to hold off on pacing and keep close follow up which has been arranged.  Physical Exam: Vitals:   01/17/17 1933 01/17/17 2353 01/18/17 0534 01/18/17 1158  BP: (!) 151/61 (!) 134/56 (!) 125/44 (!) 135/49  Pulse: (!) 53 (!) 50 (!) 55 (!) 50  Resp: 18 18 18 20   Temp: 97.7 F (36.5 C)  98.5 F (36.9 C) 98.2 F (36.8 C)  TempSrc: Oral  Oral Oral  SpO2: 97% 98% 94% 97%  Weight: 202 lb 3.2 oz (91.7 kg)  200 lb 8 oz (90.9 kg)   Height: 5\' 4"  (1.626 m)        Labs:   Lab Results  Component Value Date   WBC 6.3 01/17/2017   HGB 13.0 01/17/2017   HCT 38.7 01/17/2017   MCV 91.0 01/17/2017   PLT 233 01/17/2017     Recent Labs Lab 01/17/17 1446  NA 140  K 3.8  CL 104  CO2 26  BUN 17  CREATININE 1.02*  CALCIUM 9.8  GLUCOSE 102*    Discharge Medications:  Allergies as of 01/18/2017      Reactions   Ace Inhibitors Cough   Penicillins Rash   Has Susan [name] had a PCN reaction causing immediate rash, facial/tongue/throat swelling, SOB or lightheadedness with hypotension: No Has Susan [name] had a PCN reaction causing severe rash involving mucus membranes or skin necrosis: No Has Susan [name] had a PCN reaction that required hospitalization: No Has Susan [name] had a PCN reaction occurring within Susan last 10 years: No If all of Susan above answers are "NO", then may proceed with Cephalosporin use.      Medication List    TAKE these medications   aspirin EC 81 MG tablet Take 81 mg by mouth daily.   atorvastatin 80 MG tablet Commonly known as:  LIPITOR TAKE 1 TABLET (80 MG TOTAL) BY MOUTH DAILY AT 6 PM.   BRILINTA 60 MG Tabs tablet Generic drug:  ticagrelor TAKE 1 TABLET BY MOUTH TWICE A DAY   ezetimibe 10 MG tablet Commonly known as:  ZETIA TAKE 1 TABLET (10 MG TOTAL) BY MOUTH DAILY.   fluticasone 50 MCG/ACT nasal spray Commonly known as:  FLONASE Place 2 sprays into both nostrils daily.   furosemide 20 MG tablet Commonly known  as:  LASIX TAKE 1 TABLET BY MOUTH DAILY.   gabapentin 300 MG capsule Commonly known as:  NEURONTIN Take 1 capsule (300 mg total) by mouth 3 (three) times daily.   isosorbide mononitrate 30 MG 24 hr tablet Commonly known as:  IMDUR TAKE 1 TABLET (30 MG TOTAL) BY MOUTH DAILY.   lidocaine-prilocaine cream Commonly known as:  EMLA Apply to affected area once   loperamide 2 MG tablet Commonly known as:  IMODIUM A-D Take 2 mg by mouth 4 (four) times daily as needed for diarrhea or loose stools.   losartan 50 MG tablet Commonly known as:  COZAAR TAKE 1 TABLET BY MOUTH EVERY DAY   multivitamin with minerals Tabs tablet Take 1 tablet by mouth daily.   NITROSTAT 0.4 MG SL  tablet Generic drug:  nitroGLYCERIN PLACE 1 TABLET UNDER Susan TOUGUE EVERY 5 MINUTES FOR UP TO 3 DOSES AS NEEDED FOR CHEST PAIN   omeprazole 20 MG capsule Commonly known as:  PRILOSEC Take 20 mg by mouth daily.   vitamin B-12 1000 MCG tablet Commonly known as:  CYANOCOBALAMIN Take 1,000 mcg by mouth daily.   vitamin C 1000 MG tablet Take 1,000 mg by mouth daily.   Vitamin D (Cholecalciferol) 1000 units Caps Take 1 capsule by mouth daily.       Disposition:  Home  Discharge Instructions    Diet - low sodium heart healthy    Complete by:  As directed    Increase activity slowly    Complete by:  As directed      Follow-up Information    Constance Haw, MD Follow up on 02/09/2017.   Specialty:  Cardiology Why:  11:30AM Contact information: [place]  [id number] [phone]           Duration of Discharge Encounter: Greater than 30 minutes including physician time.  Signed, Tommye Standard, PA-C 01/18/2017 1:52 PM  I have seen and examined this Susan [name] with Tommye Standard.  Agree with above, note added to reflect my findings.  On exam, RRR, no murmurs, lungs clear. Presented to Susan hospital after bradycardia found on her holter monitor. Susan [name] asymptomatic at  this time. In discussions with Susan Susan [name], she would like to hold off on pacemaker implant and monitor symptoms for dizziness, near syncope, syncope, SOB. Nealy Hickmon plan for close office followup..    Susan Qazi M. Chaska Hagger MD 01/18/2017 1:54 PM

## 2017-01-18 NOTE — Care Management Obs Status (Signed)
[place] NOTIFICATION   Patient Details  Name: Susan [name] MRN: [id number] Date of Birth: 09-Feb-1935   Medicare Observation Status Notification Given:  Yes    Royston Bake, RN 01/18/2017, 2:18 PM

## 2017-01-20 ENCOUNTER — Other Ambulatory Visit: Payer: Self-pay | Admitting: Cardiovascular Disease

## 2017-01-30 ENCOUNTER — Inpatient Hospital Stay: Payer: Medicare Other | Attending: Hematology and Oncology

## 2017-01-30 DIAGNOSIS — Z452 Encounter for adjustment and management of vascular access device: Secondary | ICD-10-CM | POA: Diagnosis not present

## 2017-01-30 DIAGNOSIS — Z90710 Acquired absence of both cervix and uterus: Secondary | ICD-10-CM | POA: Diagnosis not present

## 2017-01-30 DIAGNOSIS — Z90722 Acquired absence of ovaries, bilateral: Secondary | ICD-10-CM | POA: Diagnosis not present

## 2017-01-30 DIAGNOSIS — Z923 Personal history of irradiation: Secondary | ICD-10-CM | POA: Insufficient documentation

## 2017-01-30 DIAGNOSIS — Z95828 Presence of other vascular implants and grafts: Secondary | ICD-10-CM

## 2017-01-30 DIAGNOSIS — Z9221 Personal history of antineoplastic chemotherapy: Secondary | ICD-10-CM | POA: Diagnosis not present

## 2017-01-30 DIAGNOSIS — Z8542 Personal history of malignant neoplasm of other parts of uterus: Secondary | ICD-10-CM | POA: Diagnosis not present

## 2017-01-30 MED ORDER — HEPARIN SOD (PORK) LOCK FLUSH 100 UNIT/ML IV SOLN
500.0000 [IU] | INTRAVENOUS | Status: AC | PRN
  Administered 2017-01-30: 500 [IU]

## 2017-01-30 MED ORDER — SODIUM CHLORIDE 0.9% FLUSH
10.0000 mL | INTRAVENOUS | Status: AC | PRN
  Administered 2017-01-30: 10 mL
  Filled 2017-01-30: qty 10

## 2017-02-08 ENCOUNTER — Other Ambulatory Visit: Payer: Self-pay | Admitting: Hematology and Oncology

## 2017-02-08 DIAGNOSIS — C541 Malignant neoplasm of endometrium: Secondary | ICD-10-CM

## 2017-02-08 DIAGNOSIS — G63 Polyneuropathy in diseases classified elsewhere: Secondary | ICD-10-CM

## 2017-02-08 DIAGNOSIS — C801 Malignant (primary) neoplasm, unspecified: Secondary | ICD-10-CM

## 2017-02-08 NOTE — Telephone Encounter (Signed)
  Ask if she wants a refill and how she is taking it.  M

## 2017-02-09 ENCOUNTER — Other Ambulatory Visit: Payer: Self-pay | Admitting: Cardiovascular Disease

## 2017-02-09 ENCOUNTER — Ambulatory Visit: Payer: Medicare Other | Admitting: Cardiology

## 2017-02-09 NOTE — Telephone Encounter (Signed)
Rx(s) sent to pharmacy electronically.  

## 2017-02-13 ENCOUNTER — Other Ambulatory Visit: Payer: Self-pay | Admitting: *Deleted

## 2017-02-13 DIAGNOSIS — I2510 Atherosclerotic heart disease of native coronary artery without angina pectoris: Secondary | ICD-10-CM

## 2017-02-13 MED ORDER — EZETIMIBE 10 MG PO TABS: 10.0000 mg | ORAL_TABLET | Freq: Every day | ORAL | 3 refills | Status: DC

## 2017-02-14 ENCOUNTER — Other Ambulatory Visit: Payer: Self-pay | Admitting: *Deleted

## 2017-02-14 ENCOUNTER — Ambulatory Visit: Payer: Medicare Other | Admitting: Cardiovascular Disease

## 2017-02-19 ENCOUNTER — Other Ambulatory Visit: Payer: Self-pay | Admitting: *Deleted

## 2017-02-19 NOTE — Telephone Encounter (Signed)
Ordered by pain management

## 2017-02-20 ENCOUNTER — Ambulatory Visit: Payer: Medicare Other | Admitting: Cardiology

## 2017-02-21 ENCOUNTER — Other Ambulatory Visit: Payer: Self-pay

## 2017-02-21 ENCOUNTER — Inpatient Hospital Stay (HOSPITAL_BASED_OUTPATIENT_CLINIC_OR_DEPARTMENT_OTHER): Payer: Medicare Other | Admitting: Obstetrics and Gynecology

## 2017-02-21 VITALS — BP 144/74 | HR 61 | Temp 97.3°F | Resp 16 | Ht 64.0 in | Wt 205.7 lb

## 2017-02-21 DIAGNOSIS — Z9221 Personal history of antineoplastic chemotherapy: Secondary | ICD-10-CM | POA: Diagnosis not present

## 2017-02-21 DIAGNOSIS — Z90722 Acquired absence of ovaries, bilateral: Secondary | ICD-10-CM | POA: Diagnosis not present

## 2017-02-21 DIAGNOSIS — C541 Malignant neoplasm of endometrium: Secondary | ICD-10-CM

## 2017-02-21 DIAGNOSIS — Z90710 Acquired absence of both cervix and uterus: Secondary | ICD-10-CM

## 2017-02-21 DIAGNOSIS — Z923 Personal history of irradiation: Secondary | ICD-10-CM

## 2017-02-21 DIAGNOSIS — Z8542 Personal history of malignant neoplasm of other parts of uterus: Secondary | ICD-10-CM

## 2017-02-21 NOTE — Progress Notes (Signed)
Susan [name] Visit   Referring Provider: Dr Joylene Igo  Chief Concern: stage IIIC1serous endometrial cancer  Subjective:  Susan [name] is a 82 y.o. P27 female who is seen in consultation from Dr. Newman Nip for serous uterine cancer.   She presents today for continued surveillance. Since her last visit the following events have occured:   She complaints of persistent urinary incontinence (managed by Dr. Bjorn Loser) and back pain due to DJD/arthritis.   She is still teaching water aerobics.  Pelvic MRI on 10/23/2016 revealed bilateral sacral insufficiency fractures with possible insufficiency fractures of the left greater than right bilateral iliac wings.  Lumbar spine MRI revealed diffuse degenerative disease.  Decision was made not to perform a sacral plasty.  She was seen by Dr. Deetta Perla on 11/07/2016.  Surgical decompression or fusion was not recommended. Recommendation was made to the pain clinic for treatment (medication, injections, or spinal cord stimulator). She is currently seeing a chiropractor and has had 6 treatments.  Chest CT on 12/05/2016 revealed scattered right lung nodules measuring up to 6 mm (mean diameter) in the right upper lobe along the minor fissure.  Metastatic disease was not excluded.  Recommendation was for follow-up CT in 3-6 months.   She saw Dr. Mike Gip on 12/07/2016. Exam negative. Plan to repeat non-contrast chest CT on 03/06/2017 to follow up on lung nodules.   12/07/2016 CA125 16.2  She was recently admitted on 01/17/3017 for asymptomatic bradycardia. She also has the following Diagnoses:  1. CAD 2. ICM     Compensated 3. HTN 4. OSA w/CPAP  She has opted to hold off on pacemaker implant.   Chest XR on 01/17/2017: No active disease. Coronary artery stents on the left. Power port in place.  She has an appointment with Dr. Mike Gip on 03/08/2017 and she presents today for a pelvic exam.      Susan [name]  Susan  BRENTLEE [name] is a 82 y.o. 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  Please see complete details in prior notes.   Path report below shows stage IIIC1 disease.  Washings were negative, but she had 3 positive SLNs in pelvis.  An aortic SLN was negative.     A. Lymph node, sentinel, right external iliac, excision: Minute focus of metastatic carcinoma involving one lymph node (1/1). See note. Size of metastasis: Less than 0.1 mm. Negative for extranodal invasion.  Note: A cytokeratin AE1/3 highlights the metastatic focus.  B. Lymph node, sentinel, right external iliac #2, excision: Metastatic carcinoma involving one lymph node (1/1). See note. Size of metastasis: 0.5 mm. Negative for extranodal invasion. Note: A cytokeratin AE1/3 highlights the metastatic focus.  C. Lymph node, sentinel, right aortic, excision: One benign lymph node (0/1). Note: A cytokeratin AE1/3 immunostain is negative.  D. Lymph node, sentinel, left external iliac, excision:  Metastatic adenocarcinoma involving one lymph node (1/1).   Size of metastasis: 1 cm. Negative for extranodal invasion. Note: A cytokeratin AE1/3 highlights the metastatic focus.  E. 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  Tumor size: 5.1 cm. The tumor invades 2 mm into a 14 mm thick myometrium. Negative for cervical involvement. Lymphovascular invasion is present.  04/28/2015 CA125 82.5   Recommendation to proceed with with carboplatinum and paclitaxel three-four cycles followed by radiation therapy followed by additional 2-3 cycles of chemotherapy.  She started on chemotherapy on 05/12/2015 and received 2nd cycle on 06/02/2015. She developed significant neuropathy impairing her quality  of life with complaints of sensory ataxia, frequent falls, and needs to walk with the help of walker. Chemotherapy was discontinued. Then received 4500 cGy pelvic radiation and 1200 cGy using high dose rate remote afterloading through vaginal cylinder completed 7/17.  08/31/2016 Abdominal and pelvic CT scan revealed no evidence of recurrent disease.  There was a new sacral insufficiency fracture.  She was referred to surgery.  Scans revealed a 3 mm pulmonary nodule.  Repeat chest CT in 3 months was recommended.   CA125 values 04/28/2015 82.5  06/02/2015 22.0 07/14/2015 18.2 10/21/2015 11.7 01/21/2016 16.4 04/25/2016 14.2 08/15/2016 13.6 12/07/2016  16.2   Problem List: Patient Active Problem List   Diagnosis Date Noted  . Bradycardia 01/18/2017  . Aortic stenosis 01/17/2017  . Obesity (BMI 30-39.9) 01/17/2017  . Pulmonary nodules 12/05/2016  . Sacral insufficiency fracture 09/05/2016  . Absence of bladder continence 07/21/2015  . Neuropathy associated with cancer ([place]) 06/02/2015  . Carcinoma of endometrium ([place]) 04/02/2015  . Gastro-esophageal reflux disease without esophagitis 02/15/2015  . Osteopenia 08/17/2014  . Kidney stones 10/27/2013  . CA skin, basal cell 06/30/2013  . Lumbar canal stenosis 06/30/2013  . Arthritis, degenerative 06/30/2013  . Neuralgia neuritis, sciatic nerve 06/30/2013  . Coronary artery disease 10/25/2012  . Dyslipidemia 10/25/2012  . NSVT (nonsustained ventricular tachycardia) ([place]) 10/25/2012  . Sick sinus syndrome ([place]) 10/25/2012  . Ischemic cardiomyopathy 10/22/2012  . Old anterior wall myocardial infarction 10/20/2012  . Hypertensive heart disease without CHF 10/20/2012    Past Medical History: Past Medical History:  Diagnosis Date  . Bradycardia   . Coronary artery disease    a. s/p STEMI in 2014 with DES to LAD with staged PCI/DES placement to LCx  . Echoencephalogram abnormality   . Heart murmur   . History of Holter monitoring   .  Hyperlipidemia   . Hypertension   . Myocardial infarction ([place])   . NSVT (nonsustained ventricular tachycardia) ([place])   . Personal history of chemotherapy    ENDOMETRIAL CA  . Personal history of radiation therapy    ENDOMETRIAL CA  . Uterine cancer ([place])    serrous    Past Surgical History: Past Surgical History:  Procedure Laterality Date  . ABDOMINAL HYSTERECTOMY    . BREAST EXCISIONAL BIOPSY Right 1956   NEG  . LEFT HEART CATHETERIZATION WITH CORONARY ANGIOGRAM  10/19/2012   Procedure: LEFT HEART CATHETERIZATION WITH CORONARY ANGIOGRAM;  Surgeon: Troy Sine, MD;  Location: [facility] CATH LAB;  Service: Cardiovascular;;  . PERCUTANEOUS CORONARY STENT INTERVENTION (PCI-S)  10/19/2012   Procedure: PERCUTANEOUS CORONARY STENT INTERVENTION (PCI-S);  Surgeon: Troy Sine, MD;  Location: [facility] CATH LAB;  Service: Cardiovascular;;  . PERCUTANEOUS CORONARY STENT INTERVENTION (PCI-S) N/A 10/23/2012   Procedure: PERCUTANEOUS CORONARY STENT INTERVENTION (PCI-S);  Surgeon: Troy Sine, MD;  Location: [facility] CATH LAB;  Service: Cardiovascular;  Laterality: N/A;  . PERIPHERAL VASCULAR CATHETERIZATION N/A 05/05/2015   Procedure: Glori Luis Cath Insertion;  Surgeon: Algernon Huxley, MD;  Location: [facility] CV LAB;  Service: Cardiovascular;  Laterality: N/A;    Family History: Family History  Problem Relation Age of Onset  . Heart disease Mother   . Breast cancer Mother 90  . Heart disease Father   . Diabetes Father   . Diabetes Brother   . Stroke Brother   . Kidney cancer Neg Hx   .  Bladder Cancer Neg Hx     Social History: Social History   Socioeconomic History  . Marital status: Married    Spouse name: Not on file  . Number of children: Not on file  . Years of education: Not on file  . Highest education level: Not on file  Social Needs  . Financial resource strain: Not on file  . Food insecurity - worry: Not on file  . Food insecurity - inability: Not on file  . Transportation needs -  medical: Not on file  . Transportation needs - non-medical: Not on file  Occupational History  . Not on file  Tobacco Use  . Smoking status: Former Smoker    Last attempt to quit: 1976    Years since quitting: 42.9  . Smokeless tobacco: Never Used  Substance and Sexual Activity  . Alcohol use: Yes    Alcohol/week: 0.6 oz    Types: 1 Glasses of wine per week  . Drug use: No  . Sexual activity: Yes  Other Topics Concern  . Not on file  Social History Narrative  . Not on file    Allergies: Allergies  Allergen Reactions  . Ace Inhibitors Cough  . Penicillins Rash    Has patient had a PCN reaction causing immediate rash, facial/tongue/throat swelling, SOB or lightheadedness with hypotension: No Has patient had a PCN reaction causing severe rash involving mucus membranes or skin necrosis: No Has patient had a PCN reaction that required hospitalization: No Has patient had a PCN reaction occurring within the last 10 years: No If all of the above answers are "NO", then may proceed with Cephalosporin use.     Current Medications: Current Outpatient Medications  Medication Sig Dispense Refill  . Ascorbic Acid (VITAMIN C) 1000 MG tablet Take 1,000 mg by mouth daily.    [name] aspirin EC 81 MG tablet Take 81 mg by mouth daily.    [name] atorvastatin (LIPITOR) 80 MG tablet TAKE 1 TABLET (80 MG TOTAL) BY MOUTH DAILY AT 6 PM. 90 tablet 1  . BRILINTA 60 MG TABS tablet TAKE 1 TABLET BY MOUTH TWICE A DAY 60 tablet 9  . ezetimibe (ZETIA) 10 MG tablet Take 1 tablet (10 mg total) by mouth daily. 90 tablet 3  . fluticasone (FLONASE) 50 MCG/ACT nasal spray Place 2 sprays into both nostrils daily.     . furosemide (LASIX) 20 MG tablet TAKE 1 TABLET BY MOUTH DAILY. 90 tablet 3  . gabapentin (NEURONTIN) 300 MG capsule Take 1 capsule (300 mg total) by mouth 3 (three) times daily. 90 capsule 1  . isosorbide mononitrate (IMDUR) 30 MG 24 hr tablet TAKE 1 TABLET (30 MG TOTAL) BY MOUTH DAILY. 90 tablet 1  .  lidocaine-prilocaine (EMLA) cream Apply to affected area once 30 g 3  . loperamide (IMODIUM A-D) 2 MG tablet Take 2 mg by mouth 4 (four) times daily as needed for diarrhea or loose stools.    [name] losartan (COZAAR) 50 MG tablet TAKE 1 TABLET BY MOUTH EVERY DAY 90 tablet 3  . Multiple Vitamin (MULTIVITAMIN WITH MINERALS) TABS Take 1 tablet by mouth daily.    [name] NITROSTAT 0.4 MG SL tablet PLACE 1 TABLET UNDER THE TOUGUE EVERY 5 MINUTES FOR UP TO 3 DOSES AS NEEDED FOR CHEST PAIN 25 tablet 0  . omeprazole (PRILOSEC) 20 MG capsule Take 20 mg by mouth daily.    . vitamin B-12 (CYANOCOBALAMIN) 1000 MCG tablet Take 1,000 mcg by mouth daily.    [name]  Vitamin D, Cholecalciferol, 1000 UNITS CAPS Take 1 capsule by mouth daily.     No current facility-administered medications for this visit.    Facility-Administered Medications Ordered in Other Visits  Medication Dose Route Frequency Provider Last Rate Last Dose  . sodium chloride flush (NS) 0.9 % injection 10 mL  10 mL Intravenous PRN Cammie Sickle, MD   10 mL at 06/06/16 1447  . sodium chloride flush (NS) 0.9 % injection 10 mL  10 mL Intravenous PRN Cammie Sickle, MD   10 mL at 08/15/16 1404   *Review of Systems General: fatigue Pulmonary:  Dyspnea with exertion,  No productive cough Gastrointestinal: negative for n/v,constipation, pain Genitourinary/Sexual: as per interval history Ob/Gyn: no vaginal discharge Musculoskeletal: back pain and leg swelling unchanged Hematology: No coagulation disorder, negative for, easy bruising, bleeding Neurologic/Psych: peripheral neuropathy  Objective:  Physical Examination:  Temp (!) 97.3 F (36.3 C)   Resp 16   Ht 5\' 4"  (1.626 m)   Wt 205 lb 11.2 oz (93.3 kg)   BMI 35.31 kg/m    ECOG Performance Status: 0 - Asymptomatic  General appearance: alert, cooperative and appears stated age Abdomen: well healed incisions, nontender, protuberant but soft. No ascites or masses or hernias Extremities:  extremities normal, atraumatic, no cyanosis. Edema symmetrical Neurological exam reveals alert, oriented, normal speech, she is walking with a cane  Pelvic: exam chaperoned by nurse;  Vulva: normal appearing vulva with no masses, tenderness or lesions; Vagina: normal vagina; no lesions; BME: no masses; Uterus/Cervix:surgically absent; Rectal: deferred.     Assessment:  Susan [name] is a 82 y.o. 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 NED today on exam and recent CA125 normal.   Body mass index is 35.31 kg/m.  Urinary incontinence.  Peripheral neuropathy due to chemotherapy.   Ischemic heart disease with cornonary stents, bradycardia requiring recent admission.   Plan:  She will continue follow up with Dr. Mike Gip at the [facility] cancer center. Dr. Mike Gip has ordered a CT scan.    She will follow up with Urology and her other care providers.   I previously reviewed surveillance recommendations and recommended continued close follow up with exams, including pelvic exams every 3-6 months for 2-3 years, then every 6-12 months for 3-5 years and then annually thereafter as long as she is NED.  Imaging and laboratory assessment is based on clinical indication. Patient education for obesity, lifestyle, exercise, nutrition, sexual health, vaginal lubricants was addressed. We previously discussed her weight and need for weight loss, stressed good nutrition, and exercise; provided information regarding calorie counting and exercise for weight loss; and offered referral to the CARE program and provided a pamphlet previously.      Gillis Ends, MD

## 2017-02-26 ENCOUNTER — Ambulatory Visit
Payer: Medicare Other | Attending: Student in an Organized Health Care Education/Training Program | Admitting: Student in an Organized Health Care Education/Training Program

## 2017-02-26 ENCOUNTER — Other Ambulatory Visit: Payer: Self-pay

## 2017-02-26 ENCOUNTER — Encounter: Payer: Self-pay | Admitting: Student in an Organized Health Care Education/Training Program

## 2017-02-26 VITALS — BP 130/50 | HR 44 | Temp 98.5°F | Resp 18 | Ht 64.0 in | Wt 205.0 lb

## 2017-02-26 DIAGNOSIS — E669 Obesity, unspecified: Secondary | ICD-10-CM | POA: Diagnosis not present

## 2017-02-26 DIAGNOSIS — Z9221 Personal history of antineoplastic chemotherapy: Secondary | ICD-10-CM | POA: Insufficient documentation

## 2017-02-26 DIAGNOSIS — Z888 Allergy status to other drugs, medicaments and biological substances status: Secondary | ICD-10-CM | POA: Diagnosis not present

## 2017-02-26 DIAGNOSIS — Z923 Personal history of irradiation: Secondary | ICD-10-CM | POA: Diagnosis not present

## 2017-02-26 DIAGNOSIS — I2510 Atherosclerotic heart disease of native coronary artery without angina pectoris: Secondary | ICD-10-CM | POA: Insufficient documentation

## 2017-02-26 DIAGNOSIS — Z90710 Acquired absence of both cervix and uterus: Secondary | ICD-10-CM | POA: Insufficient documentation

## 2017-02-26 DIAGNOSIS — I255 Ischemic cardiomyopathy: Secondary | ICD-10-CM | POA: Diagnosis not present

## 2017-02-26 DIAGNOSIS — C801 Malignant (primary) neoplasm, unspecified: Secondary | ICD-10-CM

## 2017-02-26 DIAGNOSIS — I493 Ventricular premature depolarization: Secondary | ICD-10-CM | POA: Insufficient documentation

## 2017-02-26 DIAGNOSIS — Z9889 Other specified postprocedural states: Secondary | ICD-10-CM | POA: Insufficient documentation

## 2017-02-26 DIAGNOSIS — Z955 Presence of coronary angioplasty implant and graft: Secondary | ICD-10-CM | POA: Insufficient documentation

## 2017-02-26 DIAGNOSIS — Z87891 Personal history of nicotine dependence: Secondary | ICD-10-CM | POA: Insufficient documentation

## 2017-02-26 DIAGNOSIS — Z8249 Family history of ischemic heart disease and other diseases of the circulatory system: Secondary | ICD-10-CM | POA: Insufficient documentation

## 2017-02-26 DIAGNOSIS — Z823 Family history of stroke: Secondary | ICD-10-CM | POA: Insufficient documentation

## 2017-02-26 DIAGNOSIS — I252 Old myocardial infarction: Secondary | ICD-10-CM | POA: Diagnosis not present

## 2017-02-26 DIAGNOSIS — I350 Nonrheumatic aortic (valve) stenosis: Secondary | ICD-10-CM | POA: Diagnosis not present

## 2017-02-26 DIAGNOSIS — I119 Hypertensive heart disease without heart failure: Secondary | ICD-10-CM | POA: Insufficient documentation

## 2017-02-26 DIAGNOSIS — M47816 Spondylosis without myelopathy or radiculopathy, lumbar region: Secondary | ICD-10-CM | POA: Insufficient documentation

## 2017-02-26 DIAGNOSIS — I4891 Unspecified atrial fibrillation: Secondary | ICD-10-CM | POA: Diagnosis not present

## 2017-02-26 DIAGNOSIS — Z6835 Body mass index (BMI) 35.0-35.9, adult: Secondary | ICD-10-CM | POA: Diagnosis not present

## 2017-02-26 DIAGNOSIS — Z880 Allergy status to penicillin: Secondary | ICD-10-CM | POA: Insufficient documentation

## 2017-02-26 DIAGNOSIS — Z833 Family history of diabetes mellitus: Secondary | ICD-10-CM | POA: Insufficient documentation

## 2017-02-26 DIAGNOSIS — G63 Polyneuropathy in diseases classified elsewhere: Secondary | ICD-10-CM | POA: Insufficient documentation

## 2017-02-26 DIAGNOSIS — I472 Ventricular tachycardia: Secondary | ICD-10-CM | POA: Diagnosis not present

## 2017-02-26 DIAGNOSIS — I10 Essential (primary) hypertension: Secondary | ICD-10-CM

## 2017-02-26 DIAGNOSIS — M8448XA Pathological fracture, other site, initial encounter for fracture: Secondary | ICD-10-CM | POA: Insufficient documentation

## 2017-02-26 DIAGNOSIS — Z803 Family history of malignant neoplasm of breast: Secondary | ICD-10-CM | POA: Insufficient documentation

## 2017-02-26 DIAGNOSIS — Z79899 Other long term (current) drug therapy: Secondary | ICD-10-CM | POA: Diagnosis not present

## 2017-02-26 DIAGNOSIS — E785 Hyperlipidemia, unspecified: Secondary | ICD-10-CM | POA: Insufficient documentation

## 2017-02-26 DIAGNOSIS — Z8049 Family history of malignant neoplasm of other genital organs: Secondary | ICD-10-CM | POA: Insufficient documentation

## 2017-02-26 DIAGNOSIS — G629 Polyneuropathy, unspecified: Secondary | ICD-10-CM | POA: Diagnosis not present

## 2017-02-26 DIAGNOSIS — Z950 Presence of cardiac pacemaker: Secondary | ICD-10-CM | POA: Insufficient documentation

## 2017-02-26 DIAGNOSIS — Z7982 Long term (current) use of aspirin: Secondary | ICD-10-CM | POA: Diagnosis not present

## 2017-02-26 MED ORDER — DULOXETINE HCL 30 MG PO CPEP: 30.0000 mg | ORAL_CAPSULE | Freq: Every day | ORAL | 3 refills | Status: DC

## 2017-02-26 MED ORDER — GABAPENTIN 300 MG PO CAPS: 300.0000 mg | ORAL_CAPSULE | Freq: Three times a day (TID) | ORAL | 2 refills | Status: DC

## 2017-02-26 NOTE — Progress Notes (Signed)
Safety precautions to be maintained throughout the outpatient stay will include: orient to surroundings, keep bed in low position, maintain call bell within reach at all times, provide assistance with transfer out of bed and ambulation.  

## 2017-02-26 NOTE — Patient Instructions (Signed)
1. Increase Gabapentin to 300 mg three times a day 2. If Gabapentin not helpful, add Cymbalta 30 mg daily 3. Follow up in 6-8 weeks

## 2017-02-26 NOTE — Progress Notes (Signed)
Patient's Name: Susan [name]  MRN: [id number]  Referring Provider: [name], MD  DOB: 02-May-1934  PCP: [name], MD  DOS: 02/26/2017  Note by: Gillis Santa, MD  Service setting: Ambulatory outpatient  Specialty: Interventional Pain Management  Location: [facility] (AMB) Pain Management Facility    Patient type: Established   Primary Reason(s) for Visit: Encounter for prescription drug management. (Level of risk: moderate)  CC: Back Pain (lower)  HPI  Susan [name] is a 82 y.o. year old, female patient, who comes today for a medication management evaluation. She has Old anterior wall myocardial infarction; Hypertensive heart disease without CHF; Ischemic cardiomyopathy; Coronary artery disease; Dyslipidemia; NSVT (nonsustained ventricular tachycardia) ([place]); Sick sinus syndrome ([place]); Kidney stones; CA skin, basal cell; Gastro-esophageal reflux disease without esophagitis; Lumbar canal stenosis; Osteopenia; Arthritis, degenerative; Carcinoma of endometrium ([place]); Neuralgia neuritis, sciatic nerve; Absence of bladder continence; Neuropathy associated with cancer ([place]); Sacral insufficiency fracture; Pulmonary nodules; Aortic stenosis; Obesity (BMI 30-39.9); and Bradycardia on their problem list. Her primarily concern today is the Back Pain (lower)  Pain Assessment: Location: Lower Back Radiating: n/a Onset: More than a month ago Duration: Chronic pain Quality: Aching Severity: 2 /10 (self-reported pain score)  Note: Reported level is compatible with observation.                         When using our objective Pain Scale, levels between 6 and 10/10 are said to belong in an emergency room, as it progressively worsens from a 6/10, described as severely limiting, requiring emergency care not usually available at an outpatient pain management facility. At a 6/10 level, communication becomes difficult and requires great effort. Assistance to reach the emergency department may be required.  Facial flushing and profuse sweating along with potentially dangerous increases in heart rate and blood pressure will be evident. Effect on ADL:   Timing: Intermittent Modifying factors: sitting  Ms. Mancusi was last scheduled for an appointment on 01/16/2017 for medication management. During today's appointment we reviewed Susan [name]'s chronic pain status, as well as her outpatient medication regimen.  Overall patient is doing stable in terms of her chronic pain.  She is seeing a Restaurant manager, fast food.  She is continue aquatic therapy instruction.  She did have a Holter monitor study and had bradycardic events and is scheduled to have a discussion about pacemaker placement.  She wants to hold off on radiofrequency ablation at this time which I think is reasonable.  We will try to optimize medication management which is detailed below and plan.  The patient  reports that she does not use drugs. Her body mass index is 35.19 kg/m.  Further details on both, my assessment(s), as well as the proposed treatment plan, please see below.  Laboratory Chemistry  Inflammation Markers (CRP: Acute Phase) (ESR: Chronic Phase) No results found for: CRP, ESRSEDRATE, LATICACIDVEN               Rheumatology Markers No results found for: RF, ANA, LABURIC, URICUR, LYMEIGGIGMAB, [facility]              Renal Function Markers Lab Results  Component Value Date   BUN 17 01/17/2017   CREATININE 1.02 (H) 01/17/2017   GFRAA 59 (L) 01/17/2017   GFRNONAA 51 (L) 01/17/2017                 Hepatic Function Markers Lab Results  Component Value Date   AST 33 12/07/2016   ALT 16 12/07/2016  ALBUMIN 4.0 12/07/2016   ALKPHOS 110 12/07/2016                 Electrolytes Lab Results  Component Value Date   NA 140 01/17/2017   K 3.8 01/17/2017   CL 104 01/17/2017   CALCIUM 9.8 01/17/2017   MG 2.2 07/14/2015                 Neuropathy Markers Lab Results  Component Value Date   VITAMINB12 1,218 (H) 07/21/2015    HGBA1C 5.7 (H) 10/19/2012                 Bone Pathology Markers No results found for: VD25OH, [id number], [id number], TG5498YM4, 25OHVITD1, 25OHVITD2, 25OHVITD3, TESTOFREE, TESTOSTERONE               Coagulation Parameters Lab Results  Component Value Date   INR 1.0 12/17/2013   LABPROT 13.3 12/17/2013   APTT 91 (H) 10/19/2012   PLT 233 01/17/2017                 Cardiovascular Markers Lab Results  Component Value Date   CKTOTAL 959 (H) 10/19/2012   CKMB 63.8 (H) 10/19/2012   TROPONINI <0.03 01/17/2017   HGB 13.0 01/17/2017   HCT 38.7 01/17/2017                 CA Markers Lab Results  Component Value Date   CA125 13.6 08/15/2016                 Note: Lab results reviewed.  Recent Diagnostic Imaging Results  DG Chest 2 View CLINICAL DATA:  Bradycardia.  EXAM: CHEST  2 VIEW  COMPARISON:  10/19/2012  FINDINGS: Heart size upper limits of normal. Coronary artery stents evident in the left system. Mild tortuosity of the aorta. The pulmonary vascularity is normal. Lungs are clear. No effusions. Power port inserted from a right jugular approach with tip in the SVC 3 cm above the right atrium. Chronic spinal curvature.  IMPRESSION: No active disease. Coronary artery stents on the left. Power port in place.  Electronically Signed   By: Nelson Chimes M.D.   On: 01/17/2017 16:09 Holter monitor - 48 hour Markedly abnormal monitor demonstrating predominantly underlying atrial  fibrillation with slow ventricular rate.  The average heart beat was 53  bpm.  The maximum heart rate was 109 bpm.  During the monitoring period,  the patient had significant bradycardia with heart rates down in the mid  to low 30s.  Th there were 2 episodes with heart rates below 30 at 25 bpm   and at 19 bpm immediately thereafter at 9:57 PM on 01/10/2017.  On  01/11/2017 at 7:47 AM heart rate was 24 bpm. .  The patient is not on any  AV nodal blocking agents.  The patient will be advised  to go to the  emergency room for evaluation and need for pacemaker.  There were several  PVCs, with couplets, bigeminy and trigeminy and a possible 4 beat run of  NSVT.  Complexity Note: Imaging results reviewed. Results shared with [name], using Layman's terms.                         Meds   Current Outpatient Medications:  .  Ascorbic Acid (VITAMIN C) 1000 MG tablet, Take 1,000 mg by mouth daily., Disp: , Rfl:  .  aspirin EC 81 MG tablet, Take 81 mg by mouth  daily., Disp: , Rfl:  .  atorvastatin (LIPITOR) 80 MG tablet, TAKE 1 TABLET (80 MG TOTAL) BY MOUTH DAILY AT 6 PM., Disp: 90 tablet, Rfl: 1 .  BRILINTA 60 MG TABS tablet, TAKE 1 TABLET BY MOUTH TWICE A DAY, Disp: 60 tablet, Rfl: 9 .  ezetimibe (ZETIA) 10 MG tablet, Take 1 tablet (10 mg total) by mouth daily., Disp: 90 tablet, Rfl: 3 .  fluticasone (FLONASE) 50 MCG/ACT nasal spray, Place 2 sprays into both nostrils daily. , Disp: , Rfl:  .  furosemide (LASIX) 20 MG tablet, TAKE 1 TABLET BY MOUTH DAILY., Disp: 90 tablet, Rfl: 3 .  gabapentin (NEURONTIN) 300 MG capsule, Take 1 capsule (300 mg total) by mouth 3 (three) times daily., Disp: 90 capsule, Rfl: 2 .  isosorbide mononitrate (IMDUR) 30 MG 24 hr tablet, TAKE 1 TABLET (30 MG TOTAL) BY MOUTH DAILY., Disp: 90 tablet, Rfl: 1 .  lidocaine-prilocaine (EMLA) cream, Apply to affected area once, Disp: 30 g, Rfl: 3 .  loperamide (IMODIUM A-D) 2 MG tablet, Take 2 mg by mouth 4 (four) times daily as needed for diarrhea or loose stools., Disp: , Rfl:  .  losartan (COZAAR) 50 MG tablet, TAKE 1 TABLET BY MOUTH EVERY DAY, Disp: 90 tablet, Rfl: 3 .  Multiple Vitamin (MULTIVITAMIN WITH MINERALS) TABS, Take 1 tablet by mouth daily., Disp: , Rfl:  .  NITROSTAT 0.4 MG SL tablet, PLACE 1 TABLET UNDER THE TOUGUE EVERY 5 MINUTES FOR UP TO 3 DOSES AS NEEDED FOR CHEST PAIN, Disp: 25 tablet, Rfl: 0 .  omeprazole (PRILOSEC) 20 MG capsule, Take 20 mg by mouth daily., Disp: , Rfl:  .  vitamin B-12  (CYANOCOBALAMIN) 1000 MCG tablet, Take 1,000 mcg by mouth daily., Disp: , Rfl:  .  Vitamin D, Cholecalciferol, 1000 UNITS CAPS, Take 1 capsule by mouth daily., Disp: , Rfl:  .  DULoxetine (CYMBALTA) 30 MG capsule, Take 1 capsule (30 mg total) by mouth daily., Disp: 30 capsule, Rfl: 3 No current facility-administered medications for this visit.   Facility-Administered Medications Ordered in Other Visits:  .  sodium chloride flush (NS) 0.9 % injection 10 mL, 10 mL, Intravenous, PRN, Charlaine Dalton R, MD, 10 mL at 06/06/16 1447 .  sodium chloride flush (NS) 0.9 % injection 10 mL, 10 mL, Intravenous, PRN, Cammie Sickle, MD, 10 mL at 08/15/16 1404  ROS  Constitutional: Denies any fever or chills Gastrointestinal: No reported hemesis, hematochezia, vomiting, or acute GI distress Musculoskeletal: Denies any acute onset joint swelling, redness, loss of ROM, or weakness Neurological: No reported episodes of acute onset apraxia, aphasia, dysarthria, agnosia, amnesia, paralysis, loss of coordination, or loss of consciousness  Allergies  Ms. Cuttino is allergic to ace inhibitors and penicillins.  [place]  Drug: Ms. Skyles  reports that she does not use drugs. Alcohol:  reports that she drinks about 0.6 oz of alcohol per week. Tobacco:  reports that she quit smoking about 42 years ago. she has never used smokeless tobacco. Medical:  has a past medical history of Bradycardia, Coronary artery disease, Echoencephalogram abnormality, Heart murmur, History of Holter monitoring, Hyperlipidemia, Hypertension, Myocardial infarction [facility]), NSVT (nonsustained ventricular tachycardia) ([place]), Personal history of chemotherapy, Personal history of radiation therapy, and Uterine cancer ([place]). Surgical: Ms. Carrell  has a past surgical history that includes left heart catheterization with coronary angiogram (10/19/2012); percutaneous coronary stent intervention (pci-s) (10/19/2012); percutaneous coronary stent  intervention (pci-s) (N/A, 10/23/2012); Abdominal hysterectomy; Cardiac catheterization (N/A, 05/05/2015); and Breast excisional biopsy (Right,  1956). Family: family history includes Breast cancer (age of onset: 70) in her mother; Diabetes in her brother and father; Heart disease in her father and mother; Stroke in her brother.  Constitutional Exam  General appearance: Well nourished, well developed, and well hydrated. In no apparent acute distress Vitals:   02/26/17 1235  BP: (!) 130/50  Pulse: (!) 44  Resp: 18  Temp: 98.5 F (36.9 C)  TempSrc: Oral  SpO2: 100%  Weight: 205 lb (93 kg)  Height: '5\' 4"'  (1.626 m)   BMI Assessment: Estimated body mass index is 35.19 kg/m as calculated from the following:   Height as of this encounter: '5\' 4"'  (1.626 m).   Weight as of this encounter: 205 lb (93 kg).  BMI interpretation table: BMI level Category Range association with higher incidence of chronic pain  <18 kg/m2 Underweight   18.5-24.9 kg/m2 Ideal body weight   25-29.9 kg/m2 Overweight Increased incidence by 20%  30-34.9 kg/m2 Obese (Class I) Increased incidence by 68%  35-39.9 kg/m2 Severe obesity (Class II) Increased incidence by 136%  >40 kg/m2 Extreme obesity (Class III) Increased incidence by 254%   BMI Readings from Last 4 Encounters:  02/26/17 35.19 kg/m  02/21/17 35.31 kg/m  01/18/17 34.42 kg/m  01/17/17 35.70 kg/m   Wt Readings from Last 4 Encounters:  02/26/17 205 lb (93 kg)  02/21/17 205 lb 11.2 oz (93.3 kg)  01/18/17 200 lb 8 oz (90.9 kg)  01/17/17 208 lb (94.3 kg)  Psych/Mental status: Alert, oriented x 3 (person, place, & time)       Eyes: PERLA Respiratory: No evidence of acute respiratory distress Right port in place Cervical Spine Area Exam  Skin & Axial Inspection: No masses, redness, edema, swelling, or associated skin lesions Alignment: Symmetrical Functional ROM: Unrestricted ROM      Stability: No instability detected Muscle Tone/Strength:  Functionally intact. No obvious neuro-muscular anomalies detected. Sensory (Neurological): Unimpaired Palpation: No palpable anomalies              Upper Extremity (UE) Exam    Side: Right upper extremity  Side: Left upper extremity  Skin & Extremity Inspection: Skin color, temperature, and hair growth are WNL. No peripheral edema or cyanosis. No masses, redness, swelling, asymmetry, or associated skin lesions. No contractures.  Skin & Extremity Inspection: Skin color, temperature, and hair growth are WNL. No peripheral edema or cyanosis. No masses, redness, swelling, asymmetry, or associated skin lesions. No contractures.  Functional ROM: Unrestricted ROM          Functional ROM: Unrestricted ROM          Muscle Tone/Strength: Functionally intact. No obvious neuro-muscular anomalies detected.  Muscle Tone/Strength: Functionally intact. No obvious neuro-muscular anomalies detected.  Sensory (Neurological): Unimpaired          Sensory (Neurological): Unimpaired          Palpation: No palpable anomalies              Palpation: No palpable anomalies              Specialized Test(s): Deferred         Specialized Test(s): Deferred          Thoracic Spine Area Exam  Skin & Axial Inspection: No masses, redness, or swelling Alignment: Symmetrical Functional ROM: Unrestricted ROM Stability: No instability detected Muscle Tone/Strength: Functionally intact. No obvious neuro-muscular anomalies detected. Sensory (Neurological): Unimpaired Muscle strength & Tone: No palpable anomalies  Lumbar Spine Area Exam  Skin &  Axial Inspection: No masses, redness, or swelling Alignment: Symmetrical Functional ROM: Unrestricted ROM      Stability: No instability detected Muscle Tone/Strength: Functionally intact. No obvious neuro-muscular anomalies detected. Sensory (Neurological): Unimpaired Palpation: No palpable anomalies       Provocative Tests: Lumbar Hyperextension and rotation test: evaluation deferred  today       Lumbar Lateral bending test: evaluation deferred today       Patrick's Maneuver: evaluation deferred today                    Gait & Posture Assessment  Ambulation: Unassisted Gait: Relatively normal for age and body habitus Posture: WNL   Lower Extremity Exam    Side: Right lower extremity  Side: Left lower extremity  Skin & Extremity Inspection: Skin color, temperature, and hair growth are WNL. No peripheral edema or cyanosis. No masses, redness, swelling, asymmetry, or associated skin lesions. No contractures.  Skin & Extremity Inspection: Skin color, temperature, and hair growth are WNL. No peripheral edema or cyanosis. No masses, redness, swelling, asymmetry, or associated skin lesions. No contractures.  Functional ROM: Unrestricted ROM          Functional ROM: Unrestricted ROM          Muscle Tone/Strength: Functionally intact. No obvious neuro-muscular anomalies detected.  Muscle Tone/Strength: Functionally intact. No obvious neuro-muscular anomalies detected.  Sensory (Neurological): Unimpaired  Sensory (Neurological): Unimpaired  Palpation: No palpable anomalies  Palpation: No palpable anomalies   Assessment  Primary Diagnosis & Pertinent Problem List: The primary encounter diagnosis was Spondylosis without myelopathy or radiculopathy, lumbar region. Diagnoses of Neuropathy associated with cancer [facility]), Sacral insufficiency fracture, initial encounter, Cardiomyopathy, ischemic: EF 35-40% by echo post-MI 10/20/12, and Essential hypertension were also pertinent to this visit.  Status Diagnosis  Stable Stable Stable 1. Spondylosis without myelopathy or radiculopathy, lumbar region   2. Neuropathy associated with cancer ([place])   3. Sacral insufficiency fracture, initial encounter   4. Cardiomyopathy, ischemic: EF 35-40% by echo post-MI 10/20/12   5. Essential hypertension      82 year old female with lumbar spondylosis status post bilateral lumbar facet blocks at L3/L4,  L4/L5, L5/S1 on 12/13/2016 and on 01/03/2017 with greater than 70% improvement in pain for approximately 3-4 days. Patient notes increased range of motion in her lumbar spine, pain relief, ability to ambulate for longer period of time, and ability to perform activities of daily living with greater ease.  Since our last visit on January 16, 2017, patient has been seeing a Restaurant manager, fast food which she states has been helpful.  She continues aquatic therapy instruction.  She has had a Holter study done and is in the process of discussing pacemaker placement with her cardiologist.  In regards to her pain symptoms, they are at baseline.  We will try to optimize neuropathic agents and I will refill her gabapentin at 300 mg 3 times daily and have her start Cymbalta 30 mg daily to see if they can help improve her neuropathic symptoms.  We could consider doing a radiofrequency ablation of her lumbar spine in the future when she feels that she is having a flare.  Plan: -Refilled gabapentin as below.  Prescription for Cymbalta as below. -Continue aquatic therapy, chiropractic care. -Can perform lumbar radiofrequency ablation as needed in the future.  Patient will need to be off Brilinta prior to procedure. -Follow-up in 6 weeks.   Plan of Care  Pharmacotherapy (Medications Ordered): Meds ordered this encounter  Medications  . gabapentin (  NEURONTIN) 300 MG capsule    Sig: Take 1 capsule (300 mg total) by mouth 3 (three) times daily.    Dispense:  90 capsule    Refill:  2  . DULoxetine (CYMBALTA) 30 MG capsule    Sig: Take 1 capsule (30 mg total) by mouth daily.    Dispense:  30 capsule    Refill:  3    Provider-requested follow-up: Return in about 6 weeks (around 04/09/2017) for Medication Management.  Future Appointments  Date Time Provider [place]  02/28/2017  3:00 PM [name], [name] [facility]  03/06/2017  2:00 PM [facility]  03/08/2017  2:30 PM [facility] None   03/08/2017  2:45 PM Lequita Asal, MD [facility] None  03/13/2017  2:30 PM [facility] None  03/19/2017  8:15 AM Curt Bears, Ocie Doyne, MD [facility]  04/10/2017  1:45 PM Gillis Santa, MD [facility] None  08/22/2017 10:00 AM Gillis Ends, MD [facility] None  11/07/2017  2:00 PM Noreene Filbert, MD [facility] None    Primary Care Physician: [name], MD Location: [facility] Out[facility] Management Facility Note by: Gillis Santa, M.D Date: 02/26/2017; Time: 4:05 PM  Patient Instructions  1. Increase Gabapentin to 300 mg three times a day 2. If Gabapentin not helpful, add Cymbalta 30 mg daily 3. Follow up in 6-8 weeks

## 2017-02-27 ENCOUNTER — Ambulatory Visit: Payer: Medicare Other | Admitting: Student in an Organized Health Care Education/Training Program

## 2017-02-28 ENCOUNTER — Ambulatory Visit: Payer: Medicare Other | Admitting: Physician Assistant

## 2017-02-28 ENCOUNTER — Encounter: Payer: Self-pay | Admitting: Physician Assistant

## 2017-02-28 VITALS — BP 139/73 | HR 52 | Ht 64.0 in | Wt 209.0 lb

## 2017-02-28 DIAGNOSIS — R001 Bradycardia, unspecified: Secondary | ICD-10-CM | POA: Diagnosis not present

## 2017-02-28 DIAGNOSIS — I2510 Atherosclerotic heart disease of native coronary artery without angina pectoris: Secondary | ICD-10-CM | POA: Diagnosis not present

## 2017-02-28 DIAGNOSIS — I10 Essential (primary) hypertension: Secondary | ICD-10-CM | POA: Diagnosis not present

## 2017-02-28 DIAGNOSIS — E785 Hyperlipidemia, unspecified: Secondary | ICD-10-CM

## 2017-02-28 NOTE — Patient Instructions (Addendum)
Susan [name], [name] recommends that you return for lab work in [place] with Dr. Dow Adolph will need to have FASTING blood work prior to this appointment to check cholesterol/liver function.

## 2017-02-28 NOTE — Progress Notes (Signed)
Cardiology Office Note    Date:  03/02/2017   ID:  Susan [name], DOB Aug 12, 1934, MRN [id number]  PCP:  [name], MD  Cardiologist:  Dr. Claiborne Billings   Chief Complaint  Patient presents with  . Follow-up    discuss monitor, hr was 19     History of Present Illness:  Susan [name] is a 82 y.o. female with PMH of CAD, HTN, HLD, endometrial CA s/p radiation therapy.  She had anterior STEMI on 10/19/2012, and underwent DES x 2 to LAD.  She later underwent staged PCI of left circumflex with Promus Premier DES on 10/23/2012.  Mild lesion in September 2014 demonstrated significant salvage of myocardium with only small area of distal anterior apical, apical, septal and apical scar without evidence of ischemia.  EF was 66%.  He is on both Lipitor and Zetia for cholesterol control.  She had a low risk Myoview 10/2013.  He has baseline bradycardia with episodes of nonsustained ventricular tachycardia.  She was previously evaluated for possible pacemaker and felt PPM was not indicated.  She was diagnosed with severe obstructive sleep apnea in 2016 and was initiated on CPAP therapy.  She was last seen by Dr. Claiborne Billings in October, she was doing well at that time.  Her heart rate was in the 40s not on any AV nodal blocking agent.  She will 48-hour Holter monitor which showed underlying atrial fibrillation with slow ventricular rate.  Average heart rate 53.  Maximum heart rate 109.  She had significant bradycardia with heart rate down to the mid to low 30s.  Based on this report finding, patient was instructed to go to the emergency room.  There were several PVCs, but couplet, examining/trigeminy and 4 beats run of nonsustained VT.  Monitor result was reviewed by [name], no atrial fibrillation was appreciated.  There appears to be sinus bradycardia with brief Mobitz 1 and intermittent 2-3 sec sinus pulses.  After discussing with the family, given lack of any symptoms, there was no indication for  pacing.  Patient presents today for cardiology office visit.  She denies any dizziness, blurred vision or feeling of passing out.  She has no chest pain or shortness of breath.  She has follow-up with [name] regarding bradycardia.  She is due for fasting lipid panel and LFT prior to her follow-up with Dr. Claiborne Billings.  Otherwise from cardiology perspective she is fairly stable.  She likely will require pacemaker at some point in the future.   Past Medical History:  Diagnosis Date  . Bradycardia   . Coronary artery disease    a. s/p STEMI in 2014 with DES to LAD with staged PCI/DES placement to LCx  . Echoencephalogram abnormality   . Heart murmur   . History of Holter monitoring   . Hyperlipidemia   . Hypertension   . Myocardial infarction ([place])   . NSVT (nonsustained ventricular tachycardia) ([place])   . Personal history of chemotherapy    ENDOMETRIAL CA  . Personal history of radiation therapy    ENDOMETRIAL CA  . Uterine cancer ([place])    serrous    Past Surgical History:  Procedure Laterality Date  . ABDOMINAL HYSTERECTOMY    . BREAST EXCISIONAL BIOPSY Right 1956   NEG  . LEFT HEART CATHETERIZATION WITH CORONARY ANGIOGRAM  10/19/2012   Procedure: LEFT HEART CATHETERIZATION WITH CORONARY ANGIOGRAM;  Surgeon: Troy Sine, MD;  Location: [facility] CATH LAB;  Service: Cardiovascular;;  . PERCUTANEOUS CORONARY STENT INTERVENTION (PCI-S)  10/19/2012   Procedure: PERCUTANEOUS CORONARY STENT INTERVENTION (PCI-S);  Surgeon: Troy Sine, MD;  Location: [facility] CATH LAB;  Service: Cardiovascular;;  . PERCUTANEOUS CORONARY STENT INTERVENTION (PCI-S) N/A 10/23/2012   Procedure: PERCUTANEOUS CORONARY STENT INTERVENTION (PCI-S);  Surgeon: Troy Sine, MD;  Location: [facility] CATH LAB;  Service: Cardiovascular;  Laterality: N/A;  . PERIPHERAL VASCULAR CATHETERIZATION N/A 05/05/2015   Procedure: Glori Luis Cath Insertion;  Surgeon: Algernon Huxley, MD;  Location: [facility] CV LAB;  Service: Cardiovascular;   Laterality: N/A;    Current Medications: Outpatient Medications Prior to Visit  Medication Sig Dispense Refill  . Ascorbic Acid (VITAMIN C) 1000 MG tablet Take 1,000 mg by mouth daily.    [name] aspirin EC 81 MG tablet Take 81 mg by mouth daily.    [name] atorvastatin (LIPITOR) 80 MG tablet TAKE 1 TABLET (80 MG TOTAL) BY MOUTH DAILY AT 6 PM. 90 tablet 1  . BRILINTA 60 MG TABS tablet TAKE 1 TABLET BY MOUTH TWICE A DAY 60 tablet 9  . DULoxetine (CYMBALTA) 30 MG capsule Take 1 capsule (30 mg total) by mouth daily. 30 capsule 3  . ezetimibe (ZETIA) 10 MG tablet Take 1 tablet (10 mg total) by mouth daily. 90 tablet 3  . fluticasone (FLONASE) 50 MCG/ACT nasal spray Place 2 sprays into both nostrils daily.     . furosemide (LASIX) 20 MG tablet TAKE 1 TABLET BY MOUTH DAILY. (Patient taking differently: take 1 tablet 3 times a week) 90 tablet 3  . gabapentin (NEURONTIN) 300 MG capsule Take 1 capsule (300 mg total) by mouth 3 (three) times daily. (Patient taking differently: Take 300 mg by mouth. Take 1 tablet in the morning and 2 tabs in the evening) 90 capsule 2  . isosorbide mononitrate (IMDUR) 30 MG 24 hr tablet TAKE 1 TABLET (30 MG TOTAL) BY MOUTH DAILY. 90 tablet 1  . lidocaine-prilocaine (EMLA) cream Apply to affected area once 30 g 3  . loperamide (IMODIUM A-D) 2 MG tablet Take 2 mg by mouth 4 (four) times daily as needed for diarrhea or loose stools.    [name] losartan (COZAAR) 50 MG tablet TAKE 1 TABLET BY MOUTH EVERY DAY 90 tablet 3  . Multiple Vitamin (MULTIVITAMIN WITH MINERALS) TABS Take 1 tablet by mouth daily.    [name] NITROSTAT 0.4 MG SL tablet PLACE 1 TABLET UNDER THE TOUGUE EVERY 5 MINUTES FOR UP TO 3 DOSES AS NEEDED FOR CHEST PAIN 25 tablet 0  . omeprazole (PRILOSEC) 20 MG capsule Take 20 mg by mouth daily.    . vitamin B-12 (CYANOCOBALAMIN) 1000 MCG tablet Take 1,000 mcg by mouth daily.    . Vitamin D, Cholecalciferol, 1000 UNITS CAPS Take 1 capsule by mouth daily.     Facility-Administered  Medications Prior to Visit  Medication Dose Route Frequency Provider Last Rate Last Dose  . sodium chloride flush (NS) 0.9 % injection 10 mL  10 mL Intravenous PRN Cammie Sickle, MD   10 mL at 06/06/16 1447  . sodium chloride flush (NS) 0.9 % injection 10 mL  10 mL Intravenous PRN Cammie Sickle, MD   10 mL at 08/15/16 1404     Allergies:   Ace inhibitors and Penicillins   Social History   Socioeconomic History  . Marital status: Married    Spouse name: None  . Number of children: None  . Years of education: None  . Highest education level: None  Social Needs  . Financial resource strain: None  .  Food insecurity - worry: None  . Food insecurity - inability: None  . Transportation needs - medical: None  . Transportation needs - non-medical: None  Occupational History  . None  Tobacco Use  . Smoking status: Former Smoker    Last attempt to quit: 1976    Years since quitting: 42.9  . Smokeless tobacco: Never Used  Substance and Sexual Activity  . Alcohol use: Yes    Alcohol/week: 0.6 oz    Types: 1 Glasses of wine per week  . Drug use: No  . Sexual activity: Yes  Other Topics Concern  . None  Social History Narrative  . None     Family History:  The patient's family history includes Breast cancer (age of onset: 82) in her mother; Diabetes in her brother and father; Heart disease in her father and mother; Stroke in her brother.   ROS:   Please see the history of present illness.    ROS All other systems reviewed and are negative.   PHYSICAL EXAM:   VS:  BP 139/73   Pulse (!) 52   Ht 5\' 4"  (1.626 m)   Wt 209 lb (94.8 kg)   BMI 35.87 kg/m    GEN: Well nourished, well developed, in no acute distress  HEENT: normal  Neck: no JVD, carotid bruits, or masses Cardiac: RRR; no murmurs, rubs, or gallops,no edema  Respiratory:  clear to auscultation bilaterally, normal work of breathing GI: soft, nontender, nondistended, + BS MS: no deformity or atrophy   Skin: warm and dry, no rash Neuro:  Alert and Oriented x 3, Strength and sensation are intact Psych: euthymic mood, full affect  Wt Readings from Last 3 Encounters:  02/28/17 209 lb (94.8 kg)  02/26/17 205 lb (93 kg)  02/21/17 205 lb 11.2 oz (93.3 kg)      Studies/Labs Reviewed:   EKG:  EKG is not ordered today.   Recent Labs: 06/19/2016: TSH 0.97 12/07/2016: ALT 16 01/17/2017: BUN 17; Creatinine, Ser 1.02; Hemoglobin 13.0; Platelets 233; Potassium 3.8; Sodium 140   Lipid Panel    Component Value Date/Time   CHOL 120 06/19/2016 0950   CHOL 141 04/16/2013 0924   TRIG 99 06/19/2016 0950   TRIG 112 04/16/2013 0924   HDL 54 06/19/2016 0950   HDL 53 04/16/2013 0924   CHOLHDL 2.2 06/19/2016 0950   VLDL 20 06/19/2016 0950   LDLCALC 46 06/19/2016 0950   LDLCALC 66 04/16/2013 0924    Additional studies/ records that were reviewed today include:    Echo 01/04/2017 LV EF: 40% -   45%  ------------------------------------------------------------------- Indications:      I25.5 ICardiomyopathy- ischemic.  ------------------------------------------------------------------- History:   PMH:   Coronary artery disease.  Risk factors: Hypertension.  ------------------------------------------------------------------- Study Conclusions  - Left ventricle: The cavity size was normal. There was moderate   concentric hypertrophy. Systolic function was mildly to   moderately reduced. The estimated ejection fraction was in the   range of 40% to 45%. Severe hypokinesis of the inferior,   inferoseptal, and apical myocardium. Mild anteroseptal and   inferolateral hypokinesis. Doppler parameters are consistent with   abnormal left ventricular relaxation (grade 1 diastolic   dysfunction). Doppler parameters are consistent with high   ventricular filling pressure. - Aortic valve: There was mild stenosis. Peak velocity (S): 238   cm/s. Mean gradient (S): 12 mm Hg. Valve area (VTI): 2.7  cm^2.   Valve area (Vmax): 2.18 cm^2. Valve area (Vmean): 2.39 cm^2. - Mitral  valve: Calcified annulus. Severe thickening and   calcification. Transvalvular velocity was within the normal   range. There was no evidence for stenosis. There was mild   regurgitation. Valve area by continuity equation (using LVOT   flow): 3.08 cm^2. - Left atrium: The atrium was severely dilated. - Right ventricle: The cavity size was normal. Wall thickness was   normal. Systolic function was normal. - Tricuspid valve: There was no regurgitation. - Pulmonary arteries: Systolic pressure was within the normal   range. PA peak pressure: 32 mm Hg (S). - Pericardium, extracardiac: A trivial pericardial effusion was   identified.  Impressions:  - The anterior leaflet of the mitral valve is very thickened   calcified. Mitral valve thickening was also noted in the echo   09/2012, but these images are unavailable for comparison.    Holter monitor 01/10/2017 Study Highlights   Markedly abnormal monitor demonstrating predominantly underlying atrial fibrillation with slow ventricular rate.  The average heart beat was 53 bpm.  The maximum heart rate was 109 bpm.  During the monitoring period, the patient had significant bradycardia with heart rates down in the mid to low 30s.  Th there were 2 episodes with heart rates below 30 at 25 bpm  and at 19 bpm immediately thereafter at 9:57 PM on 01/10/2017.  On 01/11/2017 at 7:47 AM heart rate was 24 bpm. .  The patient is not on any AV nodal blocking agents.  The patient will be advised to go to the emergency room for evaluation and need for pacemaker.  There were several PVCs, with couplets, bigeminy and trigeminy and a possible 4 beat run of NSVT.      ASSESSMENT:    1. Bradycardia   2. Dyslipidemia   3. Coronary artery disease involving native coronary artery of native heart without angina pectoris   4. Essential hypertension      PLAN:  In order of problems  listed above:  1. Bradycardia: Seen by [name] recently, multiple intermittent 2-3-second pauses.  She likely will need a pacemaker sometime in the future, however given the fact that she is asymptomatic, no current need for pacemaker.  She has continue follow-up was electrophysiology service.  2. CAD: Denies any chest pain.  3. Hypertension: Blood pressure mildly elevated.  Will continue on current medication.  4. Hyperlipidemia: Due for fasting lipid panel and LFT in 3 months.    Medication Adjustments/Labs and Tests Ordered: Current medicines are reviewed at length with the patient today.  Concerns regarding medicines are outlined above.  Medication changes, Labs and Tests ordered today are listed in the Patient Instructions below. Patient Instructions  Isaac Laud, [name] recommends that you return for lab work in [place] with Dr. Dow Adolph will need to have FASTING blood work prior to this appointment to check cholesterol/liver function.      Hilbert Corrigan, [name]  03/02/2017 2:52 PM    [facility] [place], [place], [place]  [address] Phone: [phone]; Fax: [phone]

## 2017-03-02 ENCOUNTER — Encounter: Payer: Self-pay | Admitting: Physician Assistant

## 2017-03-06 ENCOUNTER — Ambulatory Visit
Admission: RE | Admit: 2017-03-06 | Discharge: 2017-03-06 | Disposition: A | Discharge status: Home / Self Care | Payer: Medicare Other | Source: Ambulatory Visit | Attending: Urgent Care | Admitting: Urgent Care

## 2017-03-06 DIAGNOSIS — I2510 Atherosclerotic heart disease of native coronary artery without angina pectoris: Secondary | ICD-10-CM | POA: Diagnosis not present

## 2017-03-06 DIAGNOSIS — R918 Other nonspecific abnormal finding of lung field: Secondary | ICD-10-CM | POA: Insufficient documentation

## 2017-03-06 DIAGNOSIS — C541 Malignant neoplasm of endometrium: Secondary | ICD-10-CM | POA: Diagnosis present

## 2017-03-06 DIAGNOSIS — I700 Atherosclerosis of aorta: Secondary | ICD-10-CM | POA: Diagnosis not present

## 2017-03-08 ENCOUNTER — Inpatient Hospital Stay: Payer: Medicare Other | Attending: Hematology and Oncology

## 2017-03-08 ENCOUNTER — Telehealth (INDEPENDENT_AMBULATORY_CARE_PROVIDER_SITE_OTHER): Payer: Self-pay

## 2017-03-08 ENCOUNTER — Inpatient Hospital Stay (HOSPITAL_BASED_OUTPATIENT_CLINIC_OR_DEPARTMENT_OTHER): Payer: Medicare Other | Admitting: Hematology and Oncology

## 2017-03-08 ENCOUNTER — Other Ambulatory Visit: Payer: Self-pay

## 2017-03-08 VITALS — BP 138/66 | HR 53 | Temp 95.8°F | Resp 18 | Wt 212.5 lb

## 2017-03-08 DIAGNOSIS — Z955 Presence of coronary angioplasty implant and graft: Secondary | ICD-10-CM | POA: Diagnosis not present

## 2017-03-08 DIAGNOSIS — Z880 Allergy status to penicillin: Secondary | ICD-10-CM | POA: Insufficient documentation

## 2017-03-08 DIAGNOSIS — R001 Bradycardia, unspecified: Secondary | ICD-10-CM | POA: Diagnosis not present

## 2017-03-08 DIAGNOSIS — Z452 Encounter for adjustment and management of vascular access device: Secondary | ICD-10-CM | POA: Diagnosis not present

## 2017-03-08 DIAGNOSIS — Z7982 Long term (current) use of aspirin: Secondary | ICD-10-CM | POA: Insufficient documentation

## 2017-03-08 DIAGNOSIS — R918 Other nonspecific abnormal finding of lung field: Secondary | ICD-10-CM

## 2017-03-08 DIAGNOSIS — G629 Polyneuropathy, unspecified: Secondary | ICD-10-CM

## 2017-03-08 DIAGNOSIS — C541 Malignant neoplasm of endometrium: Secondary | ICD-10-CM

## 2017-03-08 DIAGNOSIS — M545 Low back pain: Secondary | ICD-10-CM | POA: Insufficient documentation

## 2017-03-08 DIAGNOSIS — I2510 Atherosclerotic heart disease of native coronary artery without angina pectoris: Secondary | ICD-10-CM | POA: Diagnosis not present

## 2017-03-08 DIAGNOSIS — Z87891 Personal history of nicotine dependence: Secondary | ICD-10-CM

## 2017-03-08 DIAGNOSIS — Z9221 Personal history of antineoplastic chemotherapy: Secondary | ICD-10-CM

## 2017-03-08 DIAGNOSIS — Z90722 Acquired absence of ovaries, bilateral: Secondary | ICD-10-CM | POA: Insufficient documentation

## 2017-03-08 DIAGNOSIS — I252 Old myocardial infarction: Secondary | ICD-10-CM

## 2017-03-08 DIAGNOSIS — Z8542 Personal history of malignant neoplasm of other parts of uterus: Secondary | ICD-10-CM

## 2017-03-08 DIAGNOSIS — N3941 Urge incontinence: Secondary | ICD-10-CM | POA: Insufficient documentation

## 2017-03-08 DIAGNOSIS — Z79899 Other long term (current) drug therapy: Secondary | ICD-10-CM

## 2017-03-08 DIAGNOSIS — Z90710 Acquired absence of both cervix and uterus: Secondary | ICD-10-CM | POA: Diagnosis not present

## 2017-03-08 DIAGNOSIS — G63 Polyneuropathy in diseases classified elsewhere: Secondary | ICD-10-CM

## 2017-03-08 DIAGNOSIS — E785 Hyperlipidemia, unspecified: Secondary | ICD-10-CM | POA: Diagnosis not present

## 2017-03-08 DIAGNOSIS — C801 Malignant (primary) neoplasm, unspecified: Secondary | ICD-10-CM

## 2017-03-08 DIAGNOSIS — Z923 Personal history of irradiation: Secondary | ICD-10-CM | POA: Diagnosis not present

## 2017-03-08 DIAGNOSIS — I10 Essential (primary) hypertension: Secondary | ICD-10-CM | POA: Insufficient documentation

## 2017-03-08 LAB — CBC WITH DIFFERENTIAL/PLATELET
Basophils Absolute: 0 10*3/uL (ref 0–0.1)
Basophils Relative: 1 %
Eosinophils Absolute: 0.1 10*3/uL (ref 0–0.7)
Eosinophils Relative: 2 %
HCT: 35.1 % (ref 35.0–47.0)
Hemoglobin: 11.7 g/dL — ABNORMAL LOW (ref 12.0–16.0)
Lymphocytes Relative: 19 %
Lymphs Abs: 1 10*3/uL (ref 1.0–3.6)
MCH: 30.7 pg (ref 26.0–34.0)
MCHC: 33.4 g/dL (ref 32.0–36.0)
MCV: 91.8 fL (ref 80.0–100.0)
Monocytes Absolute: 0.4 10*3/uL (ref 0.2–0.9)
Monocytes Relative: 9 %
Neutro Abs: 3.5 10*3/uL (ref 1.4–6.5)
Neutrophils Relative %: 69 %
Platelets: 230 10*3/uL (ref 150–440)
RBC: 3.82 MIL/uL (ref 3.80–5.20)
RDW: 14.2 % (ref 11.5–14.5)
WBC: 5 10*3/uL (ref 3.6–11.0)

## 2017-03-08 LAB — COMPREHENSIVE METABOLIC PANEL
ALT: 14 U/L (ref 14–54)
AST: 26 U/L (ref 15–41)
Albumin: 3.8 g/dL (ref 3.5–5.0)
Alkaline Phosphatase: 110 U/L (ref 38–126)
Anion gap: 7 (ref 5–15)
BUN: 17 mg/dL (ref 6–20)
CO2: 26 mmol/L (ref 22–32)
Calcium: 9.3 mg/dL (ref 8.9–10.3)
Chloride: 105 mmol/L (ref 101–111)
Creatinine, Ser: 1.04 mg/dL — ABNORMAL HIGH (ref 0.44–1.00)
GFR calc Af Amer: 57 mL/min — ABNORMAL LOW (ref >60)
GFR calc non Af Amer: 49 mL/min — ABNORMAL LOW (ref >60)
Glucose, Bld: 142 mg/dL — ABNORMAL HIGH (ref 65–99)
Potassium: 4 mmol/L (ref 3.5–5.1)
Sodium: 138 mmol/L (ref 135–145)
Total Bilirubin: 0.6 mg/dL (ref 0.3–1.2)
Total Protein: 6.4 g/dL — ABNORMAL LOW (ref 6.5–8.1)

## 2017-03-08 NOTE — Progress Notes (Signed)
Here for f/u. Saw Dr Claiborne Billings PA-and Dr Odette Horns and they want to put in a pacemaker -pt thinking of having lumbar radiofrequency ablation first  Needs to decide on pacemaker . Hoping port can be removed soon.

## 2017-03-08 NOTE — Progress Notes (Signed)
[facility] day:  03/08/17  Chief Complaint: Susan [name] is a 82 y.o. female with stage IIIC uterine cancer who is seen for 3 month assessment and review of interval chest CT.  HPI:  The patient was last seen in the medical oncology clinic on 12/07/2016.  At that time, she was doing well. She had continued back pain. Exam was stable. Labs were unremarkable.  We discussed short term follow-up of pulmonary nodules.  Chest CT on 03/06/2017 revealed stable tiny sub-cm right lung nodules, probably benign. Recommendation was for continued followup by chest CT in 6 months.  She saw Dr. Theora Gianotti on 02/21/2017.  She had no evidence of disease on exam.  Next visit is on 08/22/2016.  During the interim, she has done well.  She notes that she needs a pacemaker.  Heart rate was as low as 33 at [facility].  She has a follow-up with Dr. Rosiland Oz on 04/24/2016.  She has follow-up with Dr. Holley Raring to discuss FRA.   Past Medical History:  Diagnosis Date  . Bradycardia   . Coronary artery disease    a. s/p STEMI in 2014 with DES to LAD with staged PCI/DES placement to LCx  . Echoencephalogram abnormality   . Heart murmur   . History of Holter monitoring   . Hyperlipidemia   . Hypertension   . Myocardial infarction ([place])   . NSVT (nonsustained ventricular tachycardia) ([place])   . Personal history of chemotherapy    ENDOMETRIAL CA  . Personal history of radiation therapy    ENDOMETRIAL CA  . Uterine cancer ([place])    serrous    Past Surgical History:  Procedure Laterality Date  . ABDOMINAL HYSTERECTOMY    . BREAST EXCISIONAL BIOPSY Right 1956   NEG  . LEFT HEART CATHETERIZATION WITH CORONARY ANGIOGRAM  10/19/2012   Procedure: LEFT HEART CATHETERIZATION WITH CORONARY ANGIOGRAM;  Surgeon: Troy Sine, MD;  Location: [facility] CATH LAB;  Service: Cardiovascular;;  . PERCUTANEOUS CORONARY STENT INTERVENTION (PCI-S)  10/19/2012   Procedure: PERCUTANEOUS CORONARY  STENT INTERVENTION (PCI-S);  Surgeon: Troy Sine, MD;  Location: [facility] CATH LAB;  Service: Cardiovascular;;  . PERCUTANEOUS CORONARY STENT INTERVENTION (PCI-S) N/A 10/23/2012   Procedure: PERCUTANEOUS CORONARY STENT INTERVENTION (PCI-S);  Surgeon: Troy Sine, MD;  Location: [facility] CATH LAB;  Service: Cardiovascular;  Laterality: N/A;  . PERIPHERAL VASCULAR CATHETERIZATION N/A 05/05/2015   Procedure: Glori Luis Cath Insertion;  Surgeon: Algernon Huxley, MD;  Location: [place] CV LAB;  Service: Cardiovascular;  Laterality: N/A;    Family History  Problem Relation Age of Onset  . Heart disease Mother   . Breast cancer Mother 28  . Heart disease Father   . Diabetes Father   . Diabetes Brother   . Stroke Brother   . Kidney cancer Neg Hx   . Bladder Cancer Neg Hx     Social History:  reports that she quit smoking about 42 years ago. she has never used smokeless tobacco. She reports that she drinks about 0.6 oz of alcohol per week. She reports that she does not use drugs.  She is originally from [place].  She lives in [place].  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  She and her husband will be married 58 years in 11/2016.  Her husband's name is [name].  The patient is alone today.  Allergies:  Allergies  Allergen Reactions  . Ace Inhibitors Cough  .  Penicillins Rash    Has patient had a PCN reaction causing immediate rash, facial/tongue/throat swelling, SOB or lightheadedness with hypotension: No Has patient had a PCN reaction causing severe rash involving mucus membranes or skin necrosis: No Has patient had a PCN reaction that required hospitalization: No Has patient had a PCN reaction occurring within the last 10 years: No If all of the above answers are "NO", then may proceed with Cephalosporin use.     Current Medications: Current Outpatient Medications  Medication Sig Dispense Refill  . Ascorbic Acid (VITAMIN C) 1000 MG tablet Take 1,000 mg by mouth daily.     [name] aspirin EC 81 MG tablet Take 81 mg by mouth daily.    [name] atorvastatin (LIPITOR) 80 MG tablet TAKE 1 TABLET (80 MG TOTAL) BY MOUTH DAILY AT 6 PM. 90 tablet 1  . BRILINTA 60 MG TABS tablet TAKE 1 TABLET BY MOUTH TWICE A DAY 60 tablet 9  . DULoxetine (CYMBALTA) 30 MG capsule Take 1 capsule (30 mg total) by mouth daily. 30 capsule 3  . ezetimibe (ZETIA) 10 MG tablet Take 1 tablet (10 mg total) by mouth daily. 90 tablet 3  . fluticasone (FLONASE) 50 MCG/ACT nasal spray Place 2 sprays into both nostrils daily.     . furosemide (LASIX) 20 MG tablet TAKE 1 TABLET BY MOUTH DAILY. (Patient taking differently: take 1 tablet 3 times a week) 90 tablet 3  . gabapentin (NEURONTIN) 300 MG capsule Take 1 capsule (300 mg total) by mouth 3 (three) times daily. (Patient taking differently: Take 300 mg by mouth. Take 1 tablet in the morning and 2 tabs in the evening) 90 capsule 2  . isosorbide mononitrate (IMDUR) 30 MG 24 hr tablet TAKE 1 TABLET (30 MG TOTAL) BY MOUTH DAILY. 90 tablet 1  . losartan (COZAAR) 50 MG tablet TAKE 1 TABLET BY MOUTH EVERY DAY 90 tablet 3  . Multiple Vitamin (MULTIVITAMIN WITH MINERALS) TABS Take 1 tablet by mouth daily.    [name] omeprazole (PRILOSEC) 20 MG capsule Take 20 mg by mouth daily.    . vitamin B-12 (CYANOCOBALAMIN) 1000 MCG tablet Take 1,000 mcg by mouth daily.    . Vitamin D, Cholecalciferol, 1000 UNITS CAPS Take 1 capsule by mouth daily.    [name] lidocaine-prilocaine (EMLA) cream Apply to affected area once (Patient not taking: Reported on 03/08/2017) 30 g 3  . loperamide (IMODIUM A-D) 2 MG tablet Take 2 mg by mouth 4 (four) times daily as needed for diarrhea or loose stools.    [name] NITROSTAT 0.4 MG SL tablet PLACE 1 TABLET UNDER THE TOUGUE EVERY 5 MINUTES FOR UP TO 3 DOSES AS NEEDED FOR CHEST PAIN (Patient not taking: Reported on 03/08/2017) 25 tablet 0   No current facility-administered medications for this visit.    Facility-Administered Medications Ordered in Other Visits   Medication Dose Route Frequency Provider Last Rate Last Dose  . sodium chloride flush (NS) 0.9 % injection 10 mL  10 mL Intravenous PRN Cammie Sickle, MD   10 mL at 06/06/16 1447  . sodium chloride flush (NS) 0.9 % injection 10 mL  10 mL Intravenous PRN Cammie Sickle, MD   10 mL at 08/15/16 1404    Review of Systems:  GENERAL:  Feels "ok".  No fevers or sweats.  Weight up 7 pounds. PERFORMANCE STATUS (ECOG):  2 HEENT:  No visual changes, runny nose, sore throat, mouth sores or tenderness. Lungs: No shortness of breath or cough.  No hemoptysis.  Sleep apnea on CPAP.   Cardiac:  Bradycardia.  Pacemaker planned.  No chest pain, palpitations, orthopnea, or PND.  MI in 2014. GI:  Frequent stools depending diet.  Using Probiotics.  No nausea, vomiting, constipation, melena or hematochezia.  GU:  Stress incontinence.  No urgency, frequency, dysuria, or hematuria. Musculoskeletal:  Back issues.  No joint pain.  No muscle tenderness. Extremities:  No pain or swelling. Skin:  No rashes or skin changes. Neuro:  Neuropathy (feet and fingers).  No headache, focal weakness, balance or coordination issues. Endocrine:  No diabetes, thyroid issues, hot flashes or night sweats. Psych:  No mood changes, depression or anxiety. Pain:  Lower back pain (3 out of 10). Review of systems:  All other systems reviewed and found to be negative.  Physical Exam:  Blood pressure 138/66, pulse (!) 53, temperature (!) 95.8 F (35.4 C), temperature source Tympanic, resp. rate 18, weight 212 lb 8 oz (96.4 kg). GENERAL:  Well developed, well nourished, woman sitting comfortably in the exam room in no acute distress.  MENTAL STATUS:  Alert and oriented to person, place and time. HEAD:  Gray/white styled hair.  Normocephalic, atraumatic, face symmetric, no Cushingoid features. EYES:  Glasses.  Blue eyes.  Pupils equal round and reactive to light and accomodation.  No conjunctivitis or scleral icterus. ENT:   Oropharynx clear without lesion.  Tongue normal. Mucous membranes moist.  RESPIRATORY:  Clear to auscultation without rales, wheezes or rhonchi. CARDIOVASCULAR:  Regular rate and rhythm without murmur, rub or gallop. ABDOMEN:  Soft, non-tender, with active bowel sounds, and no hepatosplenomegaly.  No masses. SKIN:  No rashes, ulcers or lesions. EXTREMITIES:  Chronic lower extremity edema.  No skin discoloration or tenderness.  No palpable cords. LYMPH NODES: No palpable cervical, supraclavicular, axillary or inguinal adenopathy  NEUROLOGICAL: Appropriate. PSYCH:  Appropriate.   Appointment on 03/08/2017  Component Date Value Ref Range Status  . Sodium 03/08/2017 138  135 - 145 mmol/L Final  . Potassium 03/08/2017 4.0  3.5 - 5.1 mmol/L Final  . Chloride 03/08/2017 105  101 - 111 mmol/L Final  . CO2 03/08/2017 26  22 - 32 mmol/L Final  . Glucose, Bld 03/08/2017 142* 65 - 99 mg/dL Final  . BUN 03/08/2017 17  6 - 20 mg/dL Final  . Creatinine, Ser 03/08/2017 1.04* 0.44 - 1.00 mg/dL Final  . Calcium 03/08/2017 9.3  8.9 - 10.3 mg/dL Final  . Total Protein 03/08/2017 6.4* 6.5 - 8.1 g/dL Final  . Albumin 03/08/2017 3.8  3.5 - 5.0 g/dL Final  . AST 03/08/2017 26  15 - 41 U/L Final  . ALT 03/08/2017 14  14 - 54 U/L Final  . Alkaline Phosphatase 03/08/2017 110  38 - 126 U/L Final  . Total Bilirubin 03/08/2017 0.6  0.3 - 1.2 mg/dL Final  . GFR calc non Af Amer 03/08/2017 49* >60 mL/min Final  . GFR calc Af Amer 03/08/2017 57* >60 mL/min Final   Comment: (NOTE) The eGFR has been calculated using the CKD EPI equation. This calculation has not been validated in all clinical situations. eGFR's persistently <60 mL/min signify possible Chronic Kidney Disease.   . Anion gap 03/08/2017 7  5 - 15 Final  . WBC 03/08/2017 5.0  3.6 - 11.0 K/uL Final  . RBC 03/08/2017 3.82  3.80 - 5.20 MIL/uL Final  . Hemoglobin 03/08/2017 11.7* 12.0 - 16.0 g/dL Final  . HCT 03/08/2017 35.1  35.0 - 47.0 % Final  . MCV  03/08/2017 91.8  80.0 - 100.0 fL Final  . MCH 03/08/2017 30.7  26.0 - 34.0 pg Final  . MCHC 03/08/2017 33.4  32.0 - 36.0 g/dL Final  . RDW 03/08/2017 14.2  11.5 - 14.5 % Final  . Platelets 03/08/2017 230  150 - 440 K/uL Final  . Neutrophils Relative % 03/08/2017 69  % Final  . Neutro Abs 03/08/2017 3.5  1.4 - 6.5 K/uL Final  . Lymphocytes Relative 03/08/2017 19  % Final  . Lymphs Abs 03/08/2017 1.0  1.0 - 3.6 K/uL Final  . Monocytes Relative 03/08/2017 9  % Final  . Monocytes Absolute 03/08/2017 0.4  0.2 - 0.9 K/uL Final  . Eosinophils Relative 03/08/2017 2  % Final  . Eosinophils Absolute 03/08/2017 0.1  0 - 0.7 K/uL Final  . Basophils Relative 03/08/2017 1  % Final  . Basophils Absolute 03/08/2017 0.0  0 - 0.1 K/uL Final  . Cancer Antigen (CA) 125 03/08/2017 16.1  0.0 - 38.1 U/mL Final   Comment: (NOTE) [employer] ECLIA methodology Performed At: [facility] [address] [place], [place] [id number] Rush Farmer MD XN:[id number]     Assessment:  Susan [name] is a 82 y.o. female with stage IIIC endometrial cancer s/p TLH/BSO and staging on 04/08/2015 at [facility].  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  Tumor invaded 2 mm into a 14 mm thick myometrium.  There was no cervical involvement.   There was lymphovascular invasion.  Washings were negative.  There were 3 positive lymph nodes in the pelvis (left external iliac node: 1 cm focus without extranodal extension; less than 0.1 mm focus in 1 external right iliac node and a 0.5 mm focus in another right external iliac node).  Pathologic stage was [id number].  Abdomen and pelvic CT on 03/30/2015 revealed fluid and/or soft tissue filling the endometrial cavity, potentially prolapsing through the cervical os into the upper vagina, compatible with the reported endometrial neoplasm.  There was no definite evidence of metastatic disease in the abdomen or pelvis.   There was multiple nonobstructive calculi in the left renal collecting system.  There was grade 1 spondylolisthesis of L4 upon L5 with extensive multilevel degenerative disc disease and facet arthropathy throughout the lumbar spine, including mild levoscoliosis.  She received 2 cycles of carboplatin and Taxol (05/12/2015 - 06/02/2015).  Chemotherapy was truncated secondary to a debilitating neuropathy.  She received 4500 cGy pelvic radiation and 1200 cGy using high dose rate remote afterloading through vaginal cylinder (completed 10/18/2015).  Abdomen and pelvic CT on 08/31/2016 revealed no evidence of recurrent malignancy in the abdomen or pelvis.  There was a solid right middle lobe 3 mm pulmonary nodule, for which no prior comparison exists (this portion of the lungs was not included on the prior CT abdomen/pelvis study). There was new patchy sclerosis throughout the sacrum, probably due to post treatment change. There was new irregular curvilinear nondisplaced sacral insufficiency fractures in the bilateral sacral ala.  There was additional findings include aortic atherosclerosis, coronary atherosclerosis, sequela of remote apical left ventricular myocardial infarction, nonobstructing left nephrolithiasis, and small hiatal hernia.  Chest CT on 12/05/2016 revealed scattered right lung nodules measuring up to 6 mm (mean diameter) in the right upper lobe along the minor fissure.  Metastatic disease was not excluded.  Chest CT on 03/06/2017 revealed stable tiny sub-cm right lung nodules, probably benign. Recommendation was for continued followup by chest CT in 6 months.  CA125 has been followed:  82.5 on 04/28/2015, 22.0 on 06/02/2015, 18.2 on 07/14/2015, 11.7 on 10/21/2015, 16.4 on 01/21/2016, 14.2 on 04/25/2016, 13.6 on 08/15/2016, and 16.2 on 12/07/2016.  She was started on Neurontin for neuropathy.  Higher doses made her "fuzzy headed".  She has urge incontinence.  She is scheduled for urodynamics  testing on 09/08/2016.  Symptomatically, she has continued back pain.  She has bradycardia with plan for a pacemaker.  Exam is stable. Labs are unremarkable.   Plan: 1.  Labs today:  CBC with diff, CMP, CA125. 2.  Review interval chest CT- stable pulmonary nodules, likely benign.  Nodules too small to biopsy.  Discuss follow-up chest CT in 6 months. 3.  Port removal with Dr. Lucky Cowboy.  Continue port flushes every 6-8 weeks until removed. 4.  Schedule non-contrast chest CT on 09/04/2017 to follow up on lung nodules.  6.  Discuss ongoing surveillance.  Exam every 3-6 months for 2-3 years, then every 6-12 months for 3-5 years and then annually.  Imaging based on clinical indication. 7.  RTC after CT scan for MD assessment, labs (CBC with diff, CMP, CA125), and review of scan.   Lequita Asal, MD  03/08/2017, 3:35 PM

## 2017-03-08 NOTE — Telephone Encounter (Signed)
Attempted to contact the patient regarding her port placement and left a message for a return call.

## 2017-03-09 ENCOUNTER — Other Ambulatory Visit: Payer: Self-pay | Admitting: *Deleted

## 2017-03-09 ENCOUNTER — Other Ambulatory Visit: Payer: Self-pay | Admitting: Urgent Care

## 2017-03-09 DIAGNOSIS — C541 Malignant neoplasm of endometrium: Secondary | ICD-10-CM

## 2017-03-09 LAB — CA 125: Cancer Antigen (CA) 125: 16.1 U/mL (ref 0.0–38.1)

## 2017-03-11 ENCOUNTER — Encounter: Payer: Self-pay | Admitting: Hematology and Oncology

## 2017-03-13 ENCOUNTER — Inpatient Hospital Stay: Payer: Medicare Other

## 2017-03-13 DIAGNOSIS — Z95828 Presence of other vascular implants and grafts: Secondary | ICD-10-CM

## 2017-03-13 DIAGNOSIS — Z8542 Personal history of malignant neoplasm of other parts of uterus: Secondary | ICD-10-CM | POA: Diagnosis not present

## 2017-03-13 MED ORDER — HEPARIN SOD (PORK) LOCK FLUSH 100 UNIT/ML IV SOLN
500.0000 [IU] | INTRAVENOUS | Status: AC | PRN
  Administered 2017-03-13: 500 [IU]

## 2017-03-13 MED ORDER — SODIUM CHLORIDE 0.9% FLUSH
10.0000 mL | INTRAVENOUS | Status: AC | PRN
  Administered 2017-03-13: 10 mL
  Filled 2017-03-13: qty 10

## 2017-03-14 ENCOUNTER — Other Ambulatory Visit: Payer: Self-pay | Admitting: Cardiovascular Disease

## 2017-03-19 ENCOUNTER — Ambulatory Visit: Payer: Medicare Other | Admitting: Cardiology

## 2017-03-28 ENCOUNTER — Ambulatory Visit (INDEPENDENT_AMBULATORY_CARE_PROVIDER_SITE_OTHER): Payer: Medicare Other | Admitting: Vascular Surgery

## 2017-03-28 ENCOUNTER — Encounter (INDEPENDENT_AMBULATORY_CARE_PROVIDER_SITE_OTHER): Payer: Self-pay | Admitting: Vascular Surgery

## 2017-03-28 VITALS — BP 133/65 | HR 64 | Resp 17 | Ht 64.0 in | Wt 210.0 lb

## 2017-03-28 DIAGNOSIS — R001 Bradycardia, unspecified: Secondary | ICD-10-CM

## 2017-03-28 DIAGNOSIS — Z95828 Presence of other vascular implants and grafts: Secondary | ICD-10-CM

## 2017-03-28 NOTE — Progress Notes (Signed)
Subjective:    Patient ID: Susan [name], female    DOB: 1934-04-01, 83 y.o.   MRN: [id number] Chief Complaint  Patient presents with  . New Patient (Initial Visit)    Discuss port removal   Patient presents to discuss removal of her Port-A-Cath.  The patient had her Port-A-Cath placed for uterine cancer on 05/05/15 by Dr. Lucky Cowboy.  The patient only underwent chemotherapy for a short while as she states she "lost the feeling" in her legs.  The patient chose to undergo radiation therapy.  The patient states she has not used her Port-A-Cath since March 2018.  The patient also states her oncologist said it was okay for the removal.  The patient denies any issues with her Port-A-Cath site.  The patient has undergone regular flushes to the catheter without issue.  The patient denies any fever, nausea or vomiting.   Review of Systems  Constitutional: Negative.   HENT: Negative.   Eyes: Negative.   Respiratory: Negative.   Cardiovascular: Negative.   Gastrointestinal: Negative.   Endocrine: Negative.   Genitourinary: Negative.   Musculoskeletal: Negative.   Skin: Negative.   Allergic/Immunologic: Negative.   Neurological: Negative.   Hematological: Negative.   Psychiatric/Behavioral: Negative.       Objective:   Physical Exam  Constitutional: She is oriented to person, place, and time. She appears well-developed and well-nourished. No distress.  HENT:  Head: Normocephalic and atraumatic.  Eyes: Conjunctivae are normal. Pupils are equal, round, and reactive to light.  Neck: Normal range of motion.  Cardiovascular: Normal rate, regular rhythm, normal heart sounds and intact distal pulses.  Right chest: Port-A-Cath noted.  Incision site healed.  Pulmonary/Chest: Effort normal and breath sounds normal.  Musculoskeletal: Normal range of motion. She exhibits no edema.  Neurological: She is alert and oriented to person, place, and time.  Skin: Skin is warm and dry. She is not diaphoretic.    Psychiatric: She has a normal mood and affect. Her behavior is normal. Judgment and thought content normal.  Vitals reviewed.  BP 133/65 (BP Location: Right Arm, Patient Position: Sitting)   Pulse 64   Resp 17   Ht 5\' 4"  (1.626 m)   Wt 210 lb (95.3 kg)   BMI 36.05 kg/m   Past Medical History:  Diagnosis Date  . Bradycardia   . Coronary artery disease    a. s/p STEMI in 2014 with DES to LAD with staged PCI/DES placement to LCx  . Echoencephalogram abnormality   . Heart murmur   . History of Holter monitoring   . Hyperlipidemia   . Hypertension   . Myocardial infarction ([place])   . NSVT (nonsustained ventricular tachycardia) ([place])   . Personal history of chemotherapy    ENDOMETRIAL CA  . Personal history of radiation therapy    ENDOMETRIAL CA  . Uterine cancer ([place])    serrous   Social History   Socioeconomic History  . Marital status: Married    Spouse name: Not on file  . Number of children: Not on file  . Years of education: Not on file  . Highest education level: Not on file  Social Needs  . Financial resource strain: Not on file  . Food insecurity - worry: Not on file  . Food insecurity - inability: Not on file  . Transportation needs - medical: Not on file  . Transportation needs - non-medical: Not on file  Occupational History  . Not on file  Tobacco Use  . Smoking  status: Former Smoker    Last attempt to quit: 1976    Years since quitting: 43.0  . Smokeless tobacco: Never Used  Substance and Sexual Activity  . Alcohol use: Yes    Alcohol/week: 0.6 oz    Types: 1 Glasses of wine per week  . Drug use: No  . Sexual activity: Yes  Other Topics Concern  . Not on file  Social History Narrative  . Not on file   Past Surgical History:  Procedure Laterality Date  . ABDOMINAL HYSTERECTOMY    . BREAST EXCISIONAL BIOPSY Right 1956   NEG  . LEFT HEART CATHETERIZATION WITH CORONARY ANGIOGRAM  10/19/2012   Procedure: LEFT HEART CATHETERIZATION WITH CORONARY  ANGIOGRAM;  Surgeon: Troy Sine, MD;  Location: [facility] CATH LAB;  Service: Cardiovascular;;  . PERCUTANEOUS CORONARY STENT INTERVENTION (PCI-S)  10/19/2012   Procedure: PERCUTANEOUS CORONARY STENT INTERVENTION (PCI-S);  Surgeon: Troy Sine, MD;  Location: [facility] CATH LAB;  Service: Cardiovascular;;  . PERCUTANEOUS CORONARY STENT INTERVENTION (PCI-S) N/A 10/23/2012   Procedure: PERCUTANEOUS CORONARY STENT INTERVENTION (PCI-S);  Surgeon: Troy Sine, MD;  Location: [facility] CATH LAB;  Service: Cardiovascular;  Laterality: N/A;  . PERIPHERAL VASCULAR CATHETERIZATION N/A 05/05/2015   Procedure: Glori Luis Cath Insertion;  Surgeon: Algernon Huxley, MD;  Location: [place] CV LAB;  Service: Cardiovascular;  Laterality: N/A;   Family History  Problem Relation Age of Onset  . Heart disease Mother   . Breast cancer Mother 20  . Heart disease Father   . Diabetes Father   . Diabetes Brother   . Stroke Brother   . Kidney cancer Neg Hx   . Bladder Cancer Neg Hx    Allergies  Allergen Reactions  . Ace Inhibitors Cough  . Penicillins Rash    Has patient had a PCN reaction causing immediate rash, facial/tongue/throat swelling, SOB or lightheadedness with hypotension: No Has patient had a PCN reaction causing severe rash involving mucus membranes or skin necrosis: No Has patient had a PCN reaction that required hospitalization: No Has patient had a PCN reaction occurring within the last 10 years: No If all of the above answers are "NO", then may proceed with Cephalosporin use.       Assessment & Plan:  Patient presents to discuss removal of her Port-A-Cath.  The patient had her Port-A-Cath placed for uterine cancer on 05/05/15 by Dr. Lucky Cowboy.  The patient only underwent chemotherapy for a short while as she states she "lost the feeling" in her legs.  The patient chose to undergo radiation therapy.  The patient states she has not used her Port-A-Cath since March 2018.  The patient also states her oncologist said it was  okay for the removal.  The patient denies any issues with her Port-A-Cath site.  The patient has undergone regular flushes to the catheter without issue.  The patient denies any fever, nausea or vomiting.  1. Port-A-Cath in place - Stable The patient is asking to have her Port-A-Cath removed as she has not used it since March 2018. The patient's oncologist is in agreement and that it can be removed. Recommend removal of the patient's Port-A-Cath. Procedure, risks and benefits explained to the patient. All questions answered. The patient wishes to proceed.  2. Bradycardia - Stable Patient with bradycardia and possibly will need a pacemaker in the future She is very concerned about this.  I assured her that she would be monitored during the procedure.  Current Outpatient Medications on File Prior to  Visit  Medication Sig Dispense Refill  . Ascorbic Acid (VITAMIN C) 1000 MG tablet Take 1,000 mg by mouth daily.    [name] aspirin EC 81 MG tablet Take 81 mg by mouth daily.    [name] atorvastatin (LIPITOR) 80 MG tablet TAKE 1 TABLET (80 MG TOTAL) BY MOUTH DAILY AT 6 PM. 90 tablet 1  . BRILINTA 60 MG TABS tablet TAKE 1 TABLET BY MOUTH TWICE A DAY 60 tablet 9  . DULoxetine (CYMBALTA) 30 MG capsule Take 1 capsule (30 mg total) by mouth daily. 30 capsule 3  . ezetimibe (ZETIA) 10 MG tablet Take 1 tablet (10 mg total) by mouth daily. 90 tablet 3  . fluticasone (FLONASE) 50 MCG/ACT nasal spray Place 2 sprays into both nostrils daily.     . furosemide (LASIX) 20 MG tablet TAKE 1 TABLET BY MOUTH DAILY. (Patient taking differently: take 1 tablet 3 times a week) 90 tablet 3  . gabapentin (NEURONTIN) 300 MG capsule Take 1 capsule (300 mg total) by mouth 3 (three) times daily. (Patient taking differently: Take 300 mg by mouth. Take 1 tablet in the morning and 2 tabs in the evening) 90 capsule 2  . isosorbide mononitrate (IMDUR) 30 MG 24 hr tablet TAKE 1 TABLET (30 MG TOTAL) BY MOUTH DAILY. 90 tablet 1  .  lidocaine-prilocaine (EMLA) cream Apply to affected area once 30 g 3  . losartan (COZAAR) 50 MG tablet TAKE 1 TABLET BY MOUTH EVERY DAY 90 tablet 3  . Multiple Vitamin (MULTIVITAMIN WITH MINERALS) TABS Take 1 tablet by mouth daily.    [name] NITROSTAT 0.4 MG SL tablet PLACE 1 TABLET UNDER THE TOUGUE EVERY 5 MINUTES FOR UP TO 3 DOSES AS NEEDED FOR CHEST PAIN 25 tablet 0  . omeprazole (PRILOSEC) 20 MG capsule Take 20 mg by mouth daily.    [name] pyridOXINE (VITAMIN B-6) 50 MG tablet Take by mouth.    . vitamin B-12 (CYANOCOBALAMIN) 1000 MCG tablet Take 1,000 mcg by mouth daily.    . Vitamin D, Cholecalciferol, 1000 UNITS CAPS Take 1 capsule by mouth daily.    [name] loperamide (IMODIUM A-D) 2 MG tablet Take 2 mg by mouth 4 (four) times daily as needed for diarrhea or loose stools.     Current Facility-Administered Medications on File Prior to Visit  Medication Dose Route Frequency Provider Last Rate Last Dose  . sodium chloride flush (NS) 0.9 % injection 10 mL  10 mL Intravenous PRN Cammie Sickle, MD   10 mL at 06/06/16 1447  . sodium chloride flush (NS) 0.9 % injection 10 mL  10 mL Intravenous PRN Cammie Sickle, MD   10 mL at 08/15/16 1404   There are no Patient Instructions on file for this visit. No Follow-up on file.  Tyeson Tanimoto A Henderson [place]ampton, PA-C

## 2017-04-02 ENCOUNTER — Other Ambulatory Visit (INDEPENDENT_AMBULATORY_CARE_PROVIDER_SITE_OTHER): Payer: Self-pay | Admitting: Vascular Surgery

## 2017-04-03 ENCOUNTER — Other Ambulatory Visit: Payer: Self-pay | Admitting: Family Medicine

## 2017-04-03 DIAGNOSIS — Z1239 Encounter for other screening for malignant neoplasm of breast: Secondary | ICD-10-CM

## 2017-04-04 MED ORDER — CLINDAMYCIN PHOSPHATE 300 MG/50ML IV SOLN: 300.0000 mg | Freq: Once | INTRAVENOUS | Status: DC

## 2017-04-05 ENCOUNTER — Encounter
Admission: RE | Disposition: A | Discharge status: Home / Self Care | Payer: Self-pay | Source: Ambulatory Visit | Attending: Vascular Surgery

## 2017-04-05 ENCOUNTER — Ambulatory Visit
Admission: RE | Admit: 2017-04-05 | Discharge: 2017-04-05 | Disposition: A | Discharge status: Home / Self Care | Payer: Medicare Other | Source: Ambulatory Visit | Attending: Vascular Surgery | Admitting: Vascular Surgery

## 2017-04-05 DIAGNOSIS — Z833 Family history of diabetes mellitus: Secondary | ICD-10-CM | POA: Insufficient documentation

## 2017-04-05 DIAGNOSIS — Z888 Allergy status to other drugs, medicaments and biological substances status: Secondary | ICD-10-CM | POA: Insufficient documentation

## 2017-04-05 DIAGNOSIS — Z8249 Family history of ischemic heart disease and other diseases of the circulatory system: Secondary | ICD-10-CM | POA: Diagnosis not present

## 2017-04-05 DIAGNOSIS — C541 Malignant neoplasm of endometrium: Secondary | ICD-10-CM

## 2017-04-05 DIAGNOSIS — E785 Hyperlipidemia, unspecified: Secondary | ICD-10-CM | POA: Insufficient documentation

## 2017-04-05 DIAGNOSIS — Z880 Allergy status to penicillin: Secondary | ICD-10-CM | POA: Insufficient documentation

## 2017-04-05 DIAGNOSIS — I2510 Atherosclerotic heart disease of native coronary artery without angina pectoris: Secondary | ICD-10-CM | POA: Diagnosis not present

## 2017-04-05 DIAGNOSIS — Z452 Encounter for adjustment and management of vascular access device: Secondary | ICD-10-CM | POA: Diagnosis not present

## 2017-04-05 DIAGNOSIS — I252 Old myocardial infarction: Secondary | ICD-10-CM | POA: Diagnosis not present

## 2017-04-05 DIAGNOSIS — Z803 Family history of malignant neoplasm of breast: Secondary | ICD-10-CM | POA: Insufficient documentation

## 2017-04-05 DIAGNOSIS — I10 Essential (primary) hypertension: Secondary | ICD-10-CM | POA: Insufficient documentation

## 2017-04-05 DIAGNOSIS — Z955 Presence of coronary angioplasty implant and graft: Secondary | ICD-10-CM | POA: Insufficient documentation

## 2017-04-05 DIAGNOSIS — Z7982 Long term (current) use of aspirin: Secondary | ICD-10-CM | POA: Insufficient documentation

## 2017-04-05 DIAGNOSIS — Z87891 Personal history of nicotine dependence: Secondary | ICD-10-CM | POA: Insufficient documentation

## 2017-04-05 DIAGNOSIS — Z9889 Other specified postprocedural states: Secondary | ICD-10-CM | POA: Diagnosis not present

## 2017-04-05 DIAGNOSIS — R001 Bradycardia, unspecified: Secondary | ICD-10-CM | POA: Insufficient documentation

## 2017-04-05 DIAGNOSIS — Z90710 Acquired absence of both cervix and uterus: Secondary | ICD-10-CM | POA: Diagnosis not present

## 2017-04-05 HISTORY — PX: PORTA CATH REMOVAL: CATH118286

## 2017-04-05 SURGERY — PORTA CATH REMOVAL
Anesthesia: Moderate Sedation

## 2017-04-05 MED ORDER — ONDANSETRON HCL 4 MG/2ML IJ SOLN: 4.0000 mg | Freq: Four times a day (QID) | INTRAMUSCULAR | Status: DC | PRN

## 2017-04-05 MED ORDER — SODIUM CHLORIDE 0.9 % IV SOLN
INTRAVENOUS | Status: DC
  Administered 2017-04-05: 1000 mL via INTRAVENOUS

## 2017-04-05 MED ORDER — HYDROMORPHONE HCL 1 MG/ML IJ SOLN: 1.0000 mg | Freq: Once | INTRAMUSCULAR | Status: DC | PRN

## 2017-04-05 MED ORDER — LIDOCAINE-EPINEPHRINE (PF) 1 %-1:200000 IJ SOLN
INTRAMUSCULAR | Status: AC
  Filled 2017-04-05: qty 30

## 2017-04-05 MED ORDER — CLINDAMYCIN PHOSPHATE 300 MG/50ML IV SOLN
INTRAVENOUS | Status: AC
  Administered 2017-04-05: 11:00:00
  Filled 2017-04-05: qty 50

## 2017-04-05 MED ORDER — FENTANYL CITRATE (PF) 100 MCG/2ML IJ SOLN
INTRAMUSCULAR | Status: AC
  Filled 2017-04-05: qty 2

## 2017-04-05 MED ORDER — FENTANYL CITRATE (PF) 100 MCG/2ML IJ SOLN
INTRAMUSCULAR | Status: DC | PRN
  Administered 2017-04-05: 50 ug via INTRAVENOUS

## 2017-04-05 MED ORDER — MIDAZOLAM HCL 2 MG/2ML IJ SOLN
INTRAMUSCULAR | Status: DC | PRN
  Administered 2017-04-05 (×2): 1 mg via INTRAVENOUS

## 2017-04-05 MED ORDER — MIDAZOLAM HCL 5 MG/5ML IJ SOLN
INTRAMUSCULAR | Status: AC
  Filled 2017-04-05: qty 5

## 2017-04-05 MED ORDER — HEPARIN (PORCINE) IN NACL 2-0.9 UNIT/ML-% IJ SOLN
INTRAMUSCULAR | Status: AC
  Filled 2017-04-05: qty 500

## 2017-04-05 SURGICAL SUPPLY — 8 items
GAUZE SPONGE 4X4 12PLY STRL (GAUZE/BANDAGES/DRESSINGS) ×3 IMPLANT
PACK ANGIOGRAPHY (CUSTOM PROCEDURE TRAY) ×3 IMPLANT
PENCIL ELECTRO HAND CTR (MISCELLANEOUS) ×3 IMPLANT
SUT MNCRL 4-0 (SUTURE) ×2
SUT MNCRL 4-0 27XMFL (SUTURE) ×1
SUTURE MNCRL 4-0 27XMF (SUTURE) ×1 IMPLANT
SUTURE VIC 3-0 (SUTURE) ×3 IMPLANT
TOWEL OR 17X26 4PK STRL BLUE (TOWEL DISPOSABLE) ×3 IMPLANT

## 2017-04-05 NOTE — Op Note (Signed)
[facility]       Operative Note  Date: 04/05/2017  Preoperative diagnosis:  1.  Endometrial cancer, completed therapy no longer using port.  Cardiac disease and arrhythmias requiring pacemaker placement  Postoperative diagnosis:  Same as above  Procedures: #1. Removal of right jugular port a cath   Surgeon: Leotis Pain, MD  Anesthesia: Local with moderate conscious sedation for 15 minutes using 2 mg of Versed and 50 Mcg of Fentanyl  Fluoroscopy time: none  Contrast used: 0  Estimated blood loss:  3 cc  Indication for the procedure:  The patient is a 83 y.o. female who has endometrial cancer and has completed her therapy and no longer needs their Port-A-Cath.  He is also about to get a pacemaker, so the removal of the venous device would be helpful.  The patient desires to have this removed. Risks and benefits including need for potential replacement with recurrent disease were discussed and patient is agreeable to proceed.  Description of procedure: The patient was brought to the vascular and interventional radiology suite. Moderate conscious sedation was administered during a face to face encounter with the patient throughout the procedure with my supervision of the RN administering medicines and monitoring the patient's vital signs, pulse oximetry, telemetry and mental status throughout from the start of the procedure until the patient was taken to the recovery room.  The right neck chest and shoulder were sterilely prepped and draped, and a sterile surgical field was created. The area was then anesthetized with 1% lidocaine copiously. The previous incision was reopened and electrocautery used to dissected down to the port and the catheter. These were dissected free and the catheter was gently removed from the vein in its entirety. The port was dissected out from the fibrous connective tissue and the Prolene sutures were  removed. The port was then removed in its entirety including the catheter. The wound was then closed with a 3-0 Vicryl and a 4-0 Monocryl and Dermabond was placed as a dressing. The patient was then taken to the recovery room in stable condition having tolerated the procedure well.  Complications: none  Condition: stable   Leotis Pain, MD 04/05/2017 11:05 AM   This note was created with Dragon Medical transcription system. Any errors in dictation are purely unintentional.

## 2017-04-05 NOTE — H&P (Signed)
[facility] History & Physical Update  The patient was interviewed and re-examined.  The patient's previous History and Physical has been reviewed and is unchanged.  There is no change in the plan of care. We plan to proceed with the scheduled procedure.  Leotis Pain, MD  04/05/2017, 9:35 AM

## 2017-04-10 ENCOUNTER — Ambulatory Visit
Payer: Medicare Other | Attending: Student in an Organized Health Care Education/Training Program | Admitting: Student in an Organized Health Care Education/Training Program

## 2017-04-10 ENCOUNTER — Encounter: Payer: Self-pay | Admitting: Student in an Organized Health Care Education/Training Program

## 2017-04-10 ENCOUNTER — Other Ambulatory Visit: Payer: Self-pay

## 2017-04-10 VITALS — BP 115/40 | HR 46 | Temp 98.0°F | Resp 16 | Ht 64.0 in | Wt 219.0 lb

## 2017-04-10 DIAGNOSIS — M47816 Spondylosis without myelopathy or radiculopathy, lumbar region: Secondary | ICD-10-CM | POA: Diagnosis not present

## 2017-04-10 DIAGNOSIS — I255 Ischemic cardiomyopathy: Secondary | ICD-10-CM | POA: Diagnosis not present

## 2017-04-10 DIAGNOSIS — E785 Hyperlipidemia, unspecified: Secondary | ICD-10-CM | POA: Insufficient documentation

## 2017-04-10 DIAGNOSIS — C801 Malignant (primary) neoplasm, unspecified: Secondary | ICD-10-CM

## 2017-04-10 DIAGNOSIS — I10 Essential (primary) hypertension: Secondary | ICD-10-CM

## 2017-04-10 DIAGNOSIS — I472 Ventricular tachycardia: Secondary | ICD-10-CM | POA: Insufficient documentation

## 2017-04-10 DIAGNOSIS — I119 Hypertensive heart disease without heart failure: Secondary | ICD-10-CM | POA: Insufficient documentation

## 2017-04-10 DIAGNOSIS — G63 Polyneuropathy in diseases classified elsewhere: Secondary | ICD-10-CM

## 2017-04-10 DIAGNOSIS — M8448XA Pathological fracture, other site, initial encounter for fracture: Secondary | ICD-10-CM

## 2017-04-10 DIAGNOSIS — E669 Obesity, unspecified: Secondary | ICD-10-CM | POA: Diagnosis not present

## 2017-04-10 DIAGNOSIS — I2102 ST elevation (STEMI) myocardial infarction involving left anterior descending coronary artery: Secondary | ICD-10-CM

## 2017-04-10 DIAGNOSIS — Z880 Allergy status to penicillin: Secondary | ICD-10-CM | POA: Diagnosis not present

## 2017-04-10 DIAGNOSIS — Z6837 Body mass index (BMI) 37.0-37.9, adult: Secondary | ICD-10-CM | POA: Insufficient documentation

## 2017-04-10 DIAGNOSIS — G8929 Other chronic pain: Secondary | ICD-10-CM | POA: Insufficient documentation

## 2017-04-10 DIAGNOSIS — I495 Sick sinus syndrome: Secondary | ICD-10-CM | POA: Insufficient documentation

## 2017-04-10 DIAGNOSIS — Z7982 Long term (current) use of aspirin: Secondary | ICD-10-CM | POA: Diagnosis not present

## 2017-04-10 DIAGNOSIS — K219 Gastro-esophageal reflux disease without esophagitis: Secondary | ICD-10-CM | POA: Diagnosis not present

## 2017-04-10 DIAGNOSIS — I2510 Atherosclerotic heart disease of native coronary artery without angina pectoris: Secondary | ICD-10-CM | POA: Insufficient documentation

## 2017-04-10 DIAGNOSIS — Z5181 Encounter for therapeutic drug level monitoring: Secondary | ICD-10-CM | POA: Insufficient documentation

## 2017-04-10 DIAGNOSIS — G629 Polyneuropathy, unspecified: Secondary | ICD-10-CM | POA: Insufficient documentation

## 2017-04-10 DIAGNOSIS — Z79899 Other long term (current) drug therapy: Secondary | ICD-10-CM | POA: Insufficient documentation

## 2017-04-10 DIAGNOSIS — I252 Old myocardial infarction: Secondary | ICD-10-CM | POA: Diagnosis not present

## 2017-04-10 DIAGNOSIS — Z87891 Personal history of nicotine dependence: Secondary | ICD-10-CM | POA: Diagnosis not present

## 2017-04-10 NOTE — Progress Notes (Signed)
Safety precautions to be maintained throughout the outpatient stay will include: orient to surroundings, keep bed in low position, maintain call bell within reach at all times, provide assistance with transfer out of bed and ambulation.  

## 2017-04-10 NOTE — Progress Notes (Signed)
Patient's Name: Susan [name]  MRN: [id number]  Referring Provider: [name], MD  DOB: 1935-02-05  PCP: [name], MD  DOS: 04/10/2017  Note by: Gillis Santa, MD  Service setting: Ambulatory outpatient  Specialty: Interventional Pain Management  Location: [facility] (AMB) Pain Management Facility    Patient type: Established   Primary Reason(s) for Visit: Encounter for prescription drug management. (Level of risk: moderate)  CC: Back Pain (lower, medial)  HPI  Susan [name] is a 83 y.o. year old, female patient, who comes today for a medication management evaluation. She has Old anterior wall myocardial infarction; Hypertensive heart disease without CHF; Ischemic cardiomyopathy; Coronary artery disease; Dyslipidemia; NSVT (nonsustained ventricular tachycardia) ([place]); Sick sinus syndrome ([place]); Kidney stones; CA skin, basal cell; Gastro-esophageal reflux disease without esophagitis; Lumbar canal stenosis; Osteopenia; Arthritis, degenerative; Carcinoma of endometrium ([place]); Neuralgia neuritis, sciatic nerve; Absence of bladder continence; Neuropathy associated with cancer ([place]); Sacral insufficiency fracture; Pulmonary nodules; Obesity (BMI 30-39.9); Bradycardia; and Port-A-Cath in place on their problem list. Her primarily concern today is the Back Pain (lower, medial)  Pain Assessment: Location: Medial, Lower, Left Back Radiating: denies Onset: More than a month ago Duration: Chronic pain, Neuropathic pain Quality: Constant, Throbbing, Shooting Severity: 5 /10 (self-reported pain score)  Note: Reported level is inconsistent with clinical observations.                         When using our objective Pain Scale, levels between 6 and 10/10 are said to belong in an emergency room, as it progressively worsens from a 6/10, described as severely limiting, requiring emergency care not usually available at an outpatient pain management facility. At a 6/10 level, communication becomes difficult  and requires great effort. Assistance to reach the emergency department may be required. Facial flushing and profuse sweating along with potentially dangerous increases in heart rate and blood pressure will be evident. Effect on ADL:   Timing: Constant Modifying factors: ice, heat  Susan [name] was last scheduled for an appointment on 02/26/2017 for medication management. During today's appointment we reviewed Susan [name]'s chronic pain status, as well as her outpatient medication regimen.  Patient presents today for follow-up.  Her last visit with me was on February 26, 2017.  At that visit she was started on Cymbalta 30 mill grams daily which resulted in GI upset patient discontinued.  Patient is also weaned herself off of gabapentin and has been off of it for the last week and has not noticed any difference in her pain symptoms.  Since then she has had removal of her right jugular Port-A-Cath.  She is continuing aquatic therapy and exercises at home for her lower back pain strengthening and range of motion.  Patient is interested in proceeding with left radiofrequency ablation, starting with the left side first.  The patient  reports that she does not use drugs. Her body mass index is 37.59 kg/m.  Further details on both, my assessment(s), as well as the proposed treatment plan, please see below.   Laboratory Chemistry  Inflammation Markers (CRP: Acute Phase) (ESR: Chronic Phase) No results found for: CRP, ESRSEDRATE, LATICACIDVEN               Rheumatology Markers No results found for: RF, ANA, LABURIC, URICUR, LYMEIGGIGMAB, [facility]              Renal Function Markers Lab Results  Component Value Date   BUN 17 03/08/2017   CREATININE 1.04 (H) 03/08/2017  GFRAA 57 (L) 03/08/2017   GFRNONAA 49 (L) 03/08/2017                 Hepatic Function Markers Lab Results  Component Value Date   AST 26 03/08/2017   ALT 14 03/08/2017   ALBUMIN 3.8 03/08/2017   ALKPHOS 110 03/08/2017                  Electrolytes Lab Results  Component Value Date   NA 138 03/08/2017   K 4.0 03/08/2017   CL 105 03/08/2017   CALCIUM 9.3 03/08/2017   MG 2.2 07/14/2015                 Neuropathy Markers Lab Results  Component Value Date   VITAMINB12 1,218 (H) 07/21/2015   HGBA1C 5.7 (H) 10/19/2012                 Bone Pathology Markers No results found for: VD25OH, [id number], [id number], [id number], 25OHVITD1, 25OHVITD2, 25OHVITD3, TESTOFREE, TESTOSTERONE               Coagulation Parameters Lab Results  Component Value Date   INR 1.0 12/17/2013   LABPROT 13.3 12/17/2013   APTT 91 (H) 10/19/2012   PLT 230 03/08/2017                 Cardiovascular Markers Lab Results  Component Value Date   CKTOTAL 959 (H) 10/19/2012   CKMB 63.8 (H) 10/19/2012   TROPONINI <0.03 01/17/2017   HGB 11.7 (L) 03/08/2017   HCT 35.1 03/08/2017                 CA Markers Lab Results  Component Value Date   CA125 13.6 08/15/2016                 Note: Lab results reviewed.  Recent Diagnostic Imaging Results  PERIPHERAL VASCULAR CATHETERIZATION See op note  Complexity Note: Imaging results reviewed. Results shared with Susan [name], using Layman's terms.                         Meds   Current Outpatient Medications:  .  acetaminophen (TYLENOL) 650 MG CR tablet, Take 650 mg by mouth every 8 (eight) hours as needed for pain., Disp: , Rfl:  .  Ascorbic Acid (VITAMIN C) 1000 MG tablet, Take 1,000 mg by mouth daily., Disp: , Rfl:  .  aspirin EC 81 MG tablet, Take 81 mg by mouth daily., Disp: , Rfl:  .  atorvastatin (LIPITOR) 80 MG tablet, TAKE 1 TABLET (80 MG TOTAL) BY MOUTH DAILY AT 6 PM., Disp: 90 tablet, Rfl: 1 .  BRILINTA 60 MG TABS tablet, TAKE 1 TABLET BY MOUTH TWICE A DAY, Disp: 60 tablet, Rfl: 9 .  ezetimibe (ZETIA) 10 MG tablet, Take 1 tablet (10 mg total) by mouth daily., Disp: 90 tablet, Rfl: 3 .  fluticasone (FLONASE) 50 MCG/ACT nasal spray, Place 2 sprays into both nostrils  daily. , Disp: , Rfl:  .  furosemide (LASIX) 20 MG tablet, TAKE 1 TABLET BY MOUTH DAILY. (Patient taking differently: TAKE 1 TABLET (20 MG) BY MOUTH EVERY OTHER DAY.), Disp: 90 tablet, Rfl: 3 .  isosorbide mononitrate (IMDUR) 30 MG 24 hr tablet, TAKE 1 TABLET (30 MG TOTAL) BY MOUTH DAILY., Disp: 90 tablet, Rfl: 1 .  loperamide (IMODIUM A-D) 2 MG tablet, Take 2 mg by mouth 4 (four) times daily as needed for diarrhea or loose stools., Disp: ,  Rfl:  .  losartan (COZAAR) 50 MG tablet, TAKE 1 TABLET BY MOUTH EVERY DAY (Patient taking differently: TAKE 1 TABLET BY MOUTH EVERY DAY AT NIGHT.), Disp: 90 tablet, Rfl: 3 .  Multiple Vitamin (MULTIVITAMIN WITH MINERALS) TABS, Take 1 tablet by mouth daily., Disp: , Rfl:  .  NITROSTAT 0.4 MG SL tablet, PLACE 1 TABLET UNDER THE TOUGUE EVERY 5 MINUTES FOR UP TO 3 DOSES AS NEEDED FOR CHEST PAIN, Disp: 25 tablet, Rfl: 0 .  omeprazole (PRILOSEC) 20 MG capsule, Take 20 mg by mouth daily., Disp: , Rfl:  .  vitamin B-12 (CYANOCOBALAMIN) 1000 MCG tablet, Take 1,000 mcg by mouth daily., Disp: , Rfl:  .  Vitamin D, Cholecalciferol, 1000 UNITS CAPS, Take 1,000 Units by mouth daily. , Disp: , Rfl:  No current facility-administered medications for this visit.   Facility-Administered Medications Ordered in Other Visits:  .  sodium chloride flush (NS) 0.9 % injection 10 mL, 10 mL, Intravenous, PRN, Charlaine Dalton R, MD, 10 mL at 06/06/16 1447 .  sodium chloride flush (NS) 0.9 % injection 10 mL, 10 mL, Intravenous, PRN, Cammie Sickle, MD, 10 mL at 08/15/16 1404  ROS  Constitutional: Denies any fever or chills Gastrointestinal: No reported hemesis, hematochezia, vomiting, or acute GI distress Musculoskeletal: Denies any acute onset joint swelling, redness, loss of ROM, or weakness Neurological: No reported episodes of acute onset apraxia, aphasia, dysarthria, agnosia, amnesia, paralysis, loss of coordination, or loss of consciousness  Allergies  Ms. Faraone is  allergic to ace inhibitors and penicillins.  [place]  Drug: Ms. Colmenares  reports that she does not use drugs. Alcohol:  reports that she drinks about 0.6 oz of alcohol per week. Tobacco:  reports that she quit smoking about 43 years ago. she has never used smokeless tobacco. Medical:  has a past medical history of Bradycardia, Coronary artery disease, Echoencephalogram abnormality, Heart murmur, History of Holter monitoring, Hyperlipidemia, Hypertension, Myocardial infarction [facility]), NSVT (nonsustained ventricular tachycardia) ([place]), Personal history of chemotherapy, Personal history of radiation therapy, and Uterine cancer ([place]). Surgical: Ms. Pincock  has a past surgical history that includes left heart catheterization with coronary angiogram (10/19/2012); percutaneous coronary stent intervention (pci-s) (10/19/2012); percutaneous coronary stent intervention (pci-s) (N/A, 10/23/2012); Abdominal hysterectomy; Cardiac catheterization (N/A, 05/05/2015); Breast excisional biopsy (Right, 1956); and PORTA CATH REMOVAL (N/A, 04/05/2017). Family: family history includes Breast cancer (age of onset: 12) in her mother; Diabetes in her brother and father; Heart disease in her father and mother; Stroke in her brother.  Constitutional Exam  General appearance: Well nourished, well developed, and well hydrated. In no apparent acute distress Vitals:   04/10/17 1442  BP: (!) 115/40  Pulse: (!) 46  Resp: 16  Temp: 98 F (36.7 C)  TempSrc: Oral  SpO2: 99%  Weight: 219 lb (99.3 kg)  Height: '5\' 4"'  (1.626 m)   BMI Assessment: Estimated body mass index is 37.59 kg/m as calculated from the following:   Height as of this encounter: '5\' 4"'  (1.626 m).   Weight as of this encounter: 219 lb (99.3 kg).  BMI interpretation table: BMI level Category Range association with higher incidence of chronic pain  <18 kg/m2 Underweight   18.5-24.9 kg/m2 Ideal body weight   25-29.9 kg/m2 Overweight Increased incidence by 20%   30-34.9 kg/m2 Obese (Class I) Increased incidence by 68%  35-39.9 kg/m2 Severe obesity (Class II) Increased incidence by 136%  >40 kg/m2 Extreme obesity (Class III) Increased incidence by 254%   BMI Readings from Last  4 Encounters:  04/10/17 37.59 kg/m  04/05/17 36.05 kg/m  03/28/17 36.05 kg/m  03/08/17 36.48 kg/m   Wt Readings from Last 4 Encounters:  04/10/17 219 lb (99.3 kg)  04/05/17 210 lb (95.3 kg)  03/28/17 210 lb (95.3 kg)  03/08/17 212 lb 8 oz (96.4 kg)  Psych/Mental status: Alert, oriented x 3 (person, place, & time)       Eyes: PERLA Respiratory: No evidence of acute respiratory distress  Cervical Spine Area Exam  Skin & Axial Inspection: No masses, redness, edema, swelling, or associated skin lesions Alignment: Symmetrical Functional ROM: Unrestricted ROM      Stability: No instability detected Muscle Tone/Strength: Functionally intact. No obvious neuro-muscular anomalies detected. Sensory (Neurological): Unimpaired Palpation: No palpable anomalies              Upper Extremity (UE) Exam    Side: Right upper extremity  Side: Left upper extremity  Skin & Extremity Inspection: Skin color, temperature, and hair growth are WNL. No peripheral edema or cyanosis. No masses, redness, swelling, asymmetry, or associated skin lesions. No contractures.  Skin & Extremity Inspection: Skin color, temperature, and hair growth are WNL. No peripheral edema or cyanosis. No masses, redness, swelling, asymmetry, or associated skin lesions. No contractures.  Functional ROM: Unrestricted ROM          Functional ROM: Unrestricted ROM          Muscle Tone/Strength: Functionally intact. No obvious neuro-muscular anomalies detected.  Muscle Tone/Strength: Functionally intact. No obvious neuro-muscular anomalies detected.  Sensory (Neurological): Unimpaired          Sensory (Neurological): Unimpaired          Palpation: No palpable anomalies              Palpation: No palpable anomalies               Specialized Test(s): Deferred         Specialized Test(s): Deferred          Thoracic Spine Area Exam  Skin & Axial Inspection: No masses, redness, or swelling Alignment: Symmetrical Functional ROM: Unrestricted ROM Stability: No instability detected Muscle Tone/Strength: Functionally intact. No obvious neuro-muscular anomalies detected. Sensory (Neurological): Unimpaired Muscle strength & Tone: No palpable anomalies  Lumbar Spine Area Exam  Skin & Axial Inspection: No masses, redness, or swelling Alignment: Symmetrical Functional ROM: Decreased ROM      Stability: No instability detected Muscle Tone/Strength: Functionally intact. No obvious neuro-muscular anomalies detected. Sensory (Neurological): Articular pain pattern Palpation: Complains of area being tender to palpation       Provocative Tests: Lumbar Hyperextension and rotation test: Positive bilaterally for facet joint pain. Lumbar Lateral bending test: evaluation deferred today       Patrick's Maneuver: evaluation deferred today                    Gait & Posture Assessment  Ambulation: Unassisted Gait: Relatively normal for age and body habitus Posture: WNL   Lower Extremity Exam    Side: Right lower extremity  Side: Left lower extremity  Skin & Extremity Inspection: Skin color, temperature, and hair growth are WNL. No peripheral edema or cyanosis. No masses, redness, swelling, asymmetry, or associated skin lesions. No contractures.  Skin & Extremity Inspection: Skin color, temperature, and hair growth are WNL. No peripheral edema or cyanosis. No masses, redness, swelling, asymmetry, or associated skin lesions. No contractures.  Functional ROM: Unrestricted ROM  Functional ROM: Unrestricted ROM          Muscle Tone/Strength: Functionally intact. No obvious neuro-muscular anomalies detected.  Muscle Tone/Strength: Functionally intact. No obvious neuro-muscular anomalies detected.  Sensory (Neurological):  Unimpaired  Sensory (Neurological): Unimpaired  Palpation: No palpable anomalies  Palpation: No palpable anomalies   Assessment  Primary Diagnosis & Pertinent Problem List: The primary encounter diagnosis was Spondylosis without myelopathy or radiculopathy, lumbar region. Diagnoses of Neuropathy associated with cancer [facility]), Sacral insufficiency fracture, initial encounter, Cardiomyopathy, ischemic: EF 35-40% by echo post-MI 10/20/12, Essential hypertension, and STEMI 10/19/12- LAD DES, and CFX DES 10/24/12 were also pertinent to this visit.  Status Diagnosis  Persistent Persistent Persistent 1. Spondylosis without myelopathy or radiculopathy, lumbar region   2. Neuropathy associated with cancer ([place])   3. Sacral insufficiency fracture, initial encounter   4. Cardiomyopathy, ischemic: EF 35-40% by echo post-MI 10/20/12   5. Essential hypertension   6. STEMI 10/19/12- LAD DES, and CFX DES 10/24/12      General Recommendations: The pain condition that the patient suffers from is best treated with a multidisciplinary approach that involves an increase in physical activity to prevent de-conditioning and worsening of the pain cycle, as well as psychological counseling (formal and/or informal) to address the co-morbid psychological affects of pain. Treatment will often involve judicious use of pain medications and interventional procedures to decrease the pain, allowing the patient to participate in the physical activity that will ultimately produce long-lasting pain reductions. The goal of the multidisciplinary approach is to return the patient to a higher level of overall function and to restore their ability to perform activities of daily living.   83 year old female with a history of axial low back pain secondary to lumbar spondylosis status post 2 diagnostic lumbar medial branch facet blocks on October 10 and September 19 respectively.  The blocks did provide significant pain relief for a short period  of time.  During this time, patient endorsed improved range of motion, greater ease of performing activities of daily living, and overall better pain control..  She wanted to hold off on proceeding with radiofrequency ablation until she had a right port removed.  Since this has been completed and the patient is continuing to endorse axial low back pain  we will proceed with radiofrequency ablation starting with the left side first.  Patient is on Brilinta secondary to STEMI and cardiac stents.  She has received cardiac clearance in the past and have instructed her to stop this medication for 4 days prior to her radiofrequency ablation.  In regards to medication management, given that the patient has stopped Cymbalta secondary to GI upset and gabapentin because she did not find it effective, we discussed alpha lipoic acid for neuropathic pain.  Patient will trial this medication.  Plan: -Left L3-L5 radiofrequency ablation with sedation.  (Patient has had 2 previous diagnostic lumbar facet blocks which provided greater than 75% pain relief for 3-4 days after the procedure along with improved functional status manifested by ability to perform activities of daily living with greater ease in) -Trial of alpha lipoic acid as above.  Lab-work, procedure(s), and/or referral(s): Orders Placed This Encounter  Procedures  . Radiofrequency,Lumbar   Provider-requested follow-up: Return for Procedure.  Future Appointments  Date Time Provider [place]  04/24/2017  2:30 PM Constance Haw, MD [facility]  05/11/2017  1:40 PM [facility]  06/04/2017  9:40 AM Troy Sine, MD [facility]  08/22/2017 10:00 AM Theora Gianotti, [place]  Philbert Riser, MD [facility] None  09/04/2017  1:00 PM [facility]  09/13/2017  1:00 AM [facility] None  09/13/2017  1:30 PM Lequita Asal, MD [facility] None  11/07/2017  2:00 PM Noreene Filbert, MD [facility] None     Primary Care Physician: [name], MD Location: [facility] Note by: Gillis Santa, M.D Date: 04/10/2017; Time: 3:23 PM  Patient Instructions  1.  Trial of alpha lipoic acid 2.  Scheduled for left L3 L5 radiofrequency ablation with sedation.  Patient must be off Brilinta for 4 days prior to scheduled procedure.

## 2017-04-10 NOTE — Patient Instructions (Addendum)
1.  Trial of alpha lipoic acid 2.  Scheduled for left L3 L5 radiofrequency ablation with sedation.  Patient must be off Brilinta for 4 days prior to scheduled procedure.  GENERAL RISKS AND COMPLICATIONS  What are the risk, side effects and possible complications? Generally speaking, most procedures are safe.  However, with any procedure there are risks, side effects, and the possibility of complications.  The risks and complications are dependent upon the sites that are lesioned, or the type of nerve block to be performed.  The closer the procedure is to the spine, the more serious the risks are.  Great care is taken when placing the radio frequency needles, block needles or lesioning probes, but sometimes complications can occur. 1. Infection: Any time there is an injection through the skin, there is a risk of infection.  This is why sterile conditions are used for these blocks.  There are four possible types of infection. 1. Localized skin infection. 2. Central Nervous System Infection-This can be in the form of Meningitis, which can be deadly. 3. Epidural Infections-This can be in the form of an epidural abscess, which can cause pressure inside of the spine, causing compression of the spinal cord with subsequent paralysis. This would require an emergency surgery to decompress, and there are no guarantees that the patient would recover from the paralysis. 4. Discitis-This is an infection of the intervertebral discs.  It occurs in about 1% of discography procedures.  It is difficult to treat and it may lead to surgery.        2. Pain: the needles have to go through skin and soft tissues, will cause soreness.       3. Damage to internal structures:  The nerves to be lesioned may be near blood vessels or    other nerves which can be potentially damaged.       4. Bleeding: Bleeding is more common if the patient is taking blood thinners such as  aspirin, Coumadin, Ticiid, Plavix, etc., or if he/she have  some genetic predisposition  such as hemophilia. Bleeding into the spinal canal can cause compression of the spinal  cord with subsequent paralysis.  This would require an emergency surgery to  decompress and there are no guarantees that the patient would recover from the  paralysis.       5. Pneumothorax:  Puncturing of a lung is a possibility, every time a needle is introduced in  the area of the chest or upper back.  Pneumothorax refers to free air around the  collapsed lung(s), inside of the thoracic cavity (chest cavity).  Another two possible  complications related to a similar event would include: Hemothorax and Chylothorax.   These are variations of the Pneumothorax, where instead of air around the collapsed  lung(s), you may have blood or chyle, respectively.       6. Spinal headaches: They may occur with any procedures in the area of the spine.       7. Persistent CSF (Cerebro-Spinal Fluid) leakage: This is a rare problem, but may occur  with prolonged intrathecal or epidural catheters either due to the formation of a fistulous  track or a dural tear.       8. Nerve damage: By working so close to the spinal cord, there is always a possibility of  nerve damage, which could be as serious as a permanent spinal cord injury with  paralysis.       9. Death:  Although rare, severe deadly  allergic reactions known as "Anaphylactic  reaction" can occur to any of the medications used.      10. Worsening of the symptoms:  We can always make thing worse.  What are the chances of something like this happening? Chances of any of this occuring are extremely low.  By statistics, you have more of a chance of getting killed in a motor vehicle accident: while driving to the hospital than any of the above occurring .  Nevertheless, you should be aware that they are possibilities.  In general, it is similar to taking a shower.  Everybody knows that you can slip, hit your head and get killed.  Does that mean that you  should not shower again?  Nevertheless always keep in mind that statistics do not mean anything if you happen to be on the wrong side of them.  Even if a procedure has a 1 (one) in a 1,000,000 (million) chance of going wrong, it you happen to be that one..Also, keep in mind that by statistics, you have more of a chance of having something go wrong when taking medications.  Who should not have this procedure? If you are on a blood thinning medication (e.g. Coumadin, Plavix, see list of "Blood Thinners"), or if you have an active infection going on, you should not have the procedure.  If you are taking any blood thinners, please inform your physician.  How should I prepare for this procedure?  Do not eat or drink anything at least six hours prior to the procedure.  Bring a driver with you .  It cannot be a taxi.  Come accompanied by an adult that can drive you back, and that is strong enough to help you if your legs get weak or numb from the local anesthetic.  Take all of your medicines the morning of the procedure with just enough water to swallow them.  If you have diabetes, make sure that you are scheduled to have your procedure done first thing in the morning, whenever possible.  If you have diabetes, take only half of your insulin dose and notify our nurse that you have done so as soon as you arrive at the clinic.  If you are diabetic, but only take blood sugar pills (oral hypoglycemic), then do not take them on the morning of your procedure.  You may take them after you have had the procedure.  Do not take aspirin or any aspirin-containing medications, at least eleven (11) days prior to the procedure.  They may prolong bleeding.  Wear loose fitting clothing that may be easy to take off and that you would not mind if it got stained with Betadine or blood.  Do not wear any jewelry or perfume  Remove any nail coloring.  It will interfere with some of our monitoring equipment.  NOTE:  Remember that this is not meant to be interpreted as a complete list of all possible complications.  Unforeseen problems may occur.  BLOOD THINNERS The following drugs contain aspirin or other products, which can cause increased bleeding during surgery and should not be taken for 2 weeks prior to and 1 week after surgery.  If you should need take something for relief of minor pain, you may take acetaminophen which is found in Tylenol,m Datril, Anacin-3 and Panadol. It is not blood thinner. The products listed below are.  Do not take any of the products listed below in addition to any listed on your instruction sheet.  A.P.C or A.P.C  with Codeine Codeine Phosphate Capsules #3 Ibuprofen Ridaura  ABC compound Congesprin Imuran rimadil  Advil Cope Indocin Robaxisal  Alka-Seltzer Effervescent Pain Reliever and Antacid Coricidin or Coricidin-D  Indomethacin Rufen  Alka-Seltzer plus Cold Medicine Cosprin Ketoprofen S-A-C Tablets  Anacin Analgesic Tablets or Capsules Coumadin Korlgesic Salflex  Anacin Extra Strength Analgesic tablets or capsules CP-2 Tablets Lanoril Salicylate  Anaprox Cuprimine Capsules Levenox Salocol  Anexsia-D Dalteparin Magan Salsalate  Anodynos Darvon compound Magnesium Salicylate Sine-off  Ansaid Dasin Capsules Magsal Sodium Salicylate  Anturane Depen Capsules Marnal Soma  APF Arthritis pain formula Dewitt's Pills Measurin Stanback  Argesic Dia-Gesic Meclofenamic Sulfinpyrazone  Arthritis Bayer Timed Release Aspirin Diclofenac Meclomen Sulindac  Arthritis pain formula Anacin Dicumarol Medipren Supac  Analgesic (Safety coated) Arthralgen Diffunasal Mefanamic Suprofen  Arthritis Strength Bufferin Dihydrocodeine Mepro Compound Suprol  Arthropan liquid Dopirydamole Methcarbomol with Aspirin Synalgos  ASA tablets/Enseals Disalcid Micrainin Tagament  Ascriptin Doan's Midol Talwin  Ascriptin A/D Dolene Mobidin Tanderil  Ascriptin Extra Strength Dolobid Moblgesic Ticlid   Ascriptin with Codeine Doloprin or Doloprin with Codeine Momentum Tolectin  Asperbuf Duoprin Mono-gesic Trendar  Aspergum Duradyne Motrin or Motrin IB Triminicin  Aspirin plain, buffered or enteric coated Durasal Myochrisine Trigesic  Aspirin Suppositories Easprin Nalfon Trillsate  Aspirin with Codeine Ecotrin Regular or Extra Strength Naprosyn Uracel  Atromid-S Efficin Naproxen Ursinus  Auranofin Capsules Elmiron Neocylate Vanquish  Axotal Emagrin Norgesic Verin  Azathioprine Empirin or Empirin with Codeine Normiflo Vitamin E  Azolid Emprazil Nuprin Voltaren  Bayer Aspirin plain, buffered or children's or timed BC Tablets or powders Encaprin Orgaran Warfarin Sodium  Buff-a-Comp Enoxaparin Orudis Zorpin  Buff-a-Comp with Codeine Equegesic Os-Cal-Gesic   Buffaprin Excedrin plain, buffered or Extra Strength Oxalid   Bufferin Arthritis Strength Feldene Oxphenbutazone   Bufferin plain or Extra Strength Feldene Capsules Oxycodone with Aspirin   Bufferin with Codeine Fenoprofen Fenoprofen Pabalate or Pabalate-SF   Buffets II Flogesic Panagesic   Buffinol plain or Extra Strength Florinal or Florinal with Codeine Panwarfarin   Buf-Tabs Flurbiprofen Penicillamine   Butalbital Compound Four-way cold tablets Penicillin   Butazolidin Fragmin Pepto-Bismol   Carbenicillin Geminisyn Percodan   Carna Arthritis Reliever Geopen Persantine   Carprofen Gold's salt Persistin   Chloramphenicol Goody's Phenylbutazone   Chloromycetin Haltrain Piroxlcam   Clmetidine heparin Plaquenil   Cllnoril Hyco-pap Ponstel   Clofibrate Hydroxy chloroquine Propoxyphen         Before stopping any of these medications, be sure to consult the physician who ordered them.  Some, such as Coumadin (Warfarin) are ordered to prevent or treat serious conditions such as "deep thrombosis", "pumonary embolisms", and other heart problems.  The amount of time that you may need off of the medication may also vary with the medication  and the reason for which you were taking it.  If you are taking any of these medications, please make sure you notify your pain physician before you undergo any procedures.         Radiofrequency Lesioning Radiofrequency lesioning is a procedure that is performed to relieve pain. The procedure is often used for back, neck, or arm pain. Radiofrequency lesioning involves the use of a machine that creates radio waves to make heat. During the procedure, the heat is applied to the nerve that carries the pain signal. The heat damages the nerve and interferes with the pain signal. Pain relief usually starts about 2 weeks after the procedure and lasts for 6 months to 1 year. Tell a health care provider about:  Any  allergies you have.  All medicines you are taking, including vitamins, herbs, eye drops, creams, and over-the-counter medicines.  Any problems you or family members have had with anesthetic medicines.  Any blood disorders you have.  Any surgeries you have had.  Any medical conditions you have.  Whether you are pregnant or may be pregnant. What are the risks? Generally, this is a safe procedure. However, problems may occur, including:  Pain or soreness at the injection site.  Infection at the injection site.  Damage to nerves or blood vessels.  What happens before the procedure?  Ask your health care provider about: ? Changing or stopping your regular medicines. This is especially important if you are taking diabetes medicines or blood thinners. ? Taking medicines such as aspirin and ibuprofen. These medicines can thin your blood. Do not take these medicines before your procedure if your health care provider instructs you not to.  Follow instructions from your health care provider about eating or drinking restrictions.  Plan to have someone take you home after the procedure.  If you go home right after the procedure, plan to have someone with you for 24 hours. What  happens during the procedure?  You will be given one or more of the following: ? A medicine to help you relax (sedative). ? A medicine to numb the area (local anesthetic).  You will be awake during the procedure. You will need to be able to talk with the health care provider during the procedure.  With the help of a type of X-ray (fluoroscopy), the health care provider will insert a radiofrequency needle into the area to be treated.  Next, a wire that carries the radio waves (electrode) will be put through the radiofrequency needle. An electrical pulse will be sent through the electrode to verify the correct nerve. You will feel a tingling sensation, and you may have muscle twitching.  Then, the tissue that is around the needle tip will be heated by an electric current that is passed using the radiofrequency machine. This will numb the nerves.  A bandage (dressing) will be put on the insertion area after the procedure is done. The procedure may vary among health care providers and hospitals. What happens after the procedure?  Your blood pressure, heart rate, breathing rate, and blood oxygen level will be monitored often until the medicines you were given have worn off.  Return to your normal activities as directed by your health care provider. This information is not intended to replace advice given to you by your health care provider. Make sure you discuss any questions you have with your health care provider. Document Released: 11/09/2010 Document Revised: 08/19/2015 Document Reviewed: 04/20/2014 Elsevier Interactive Patient Education  [employer].

## 2017-04-18 ENCOUNTER — Telehealth: Payer: Self-pay

## 2017-04-24 ENCOUNTER — Encounter: Payer: Self-pay | Admitting: Cardiology

## 2017-04-24 ENCOUNTER — Ambulatory Visit: Payer: Medicare Other | Admitting: Cardiology

## 2017-04-24 VITALS — BP 142/78 | HR 58 | Ht 64.0 in | Wt 206.0 lb

## 2017-04-24 DIAGNOSIS — I2510 Atherosclerotic heart disease of native coronary artery without angina pectoris: Secondary | ICD-10-CM

## 2017-04-24 DIAGNOSIS — R001 Bradycardia, unspecified: Secondary | ICD-10-CM | POA: Diagnosis not present

## 2017-04-24 NOTE — Patient Instructions (Signed)
Medication Instructions:  Your physician recommends that you continue on your current medications as directed. Please refer to the Current Medication list given to you today.  If you need a refill on your cardiac medications before your next appointment, please call your pharmacy.   Labwork: None ordered  Testing/Procedures: None ordered  Follow-Up: Your physician wants you to follow-up in: 6 months with Dr. Camnitz.  You will receive a reminder letter in the mail two months in advance. If you don't receive a letter, please call our office to schedule the follow-up appointment.  Thank you for choosing [facility]!!   Makiyah Zentz, RN [phone]         

## 2017-04-24 NOTE — Progress Notes (Signed)
Electrophysiology Office Note   Date:  04/24/2017   ID:  Susan [name], DOB [birth year], MRN [id number]  PCP:  [name], MD  Cardiologist:  Shelva Majestic Primary Electrophysiologist:  Constance Haw, MD    Chief Complaint  Patient presents with  . Follow-up    Bradycardia     History of Present Illness: Susan [name] is a 83 y.o. female who presents today for electrophysiology evaluation.   History of coronary artery disease. In July 2014, she presented to [facility] with an anterior wall STEMI and underwent emergent cardiac catheterization where her LAD was found to be totally occluded. She underwent stenting of her LAD eInitially she had significant wall motion abnormality involving the LAD territory. She also was found to have a 95% mid AV groove circumflex stenosis, on 10/23/2012 she underwent successful staged intervention with insertion with a DES stent into the circumflex vessel. She wore a cardiac monitor that showed heart rates as low as 20 but with sinus bradycardia in the 40s-50s and up to 2-3-second pauses.  Her heart rates were in the 60s-80s with activity.  She was sent to the emergency room, but did not get a pacemaker due to lack of symptoms.  Today, denies symptoms of palpitations, chest pain, shortness of breath, orthopnea, PND, lower extremity edema, claudication, dizziness, presyncope, syncope, bleeding, or neurologic sequela. The patient is tolerating medications without difficulties.  She is continuing to feel well.  She is not having chest pain, weakness, or fatigue.  She is able to do all of her daily activities.  She does get mild shortness of breath with exertion such as climbing a flight of stairs.  Otherwise she is doing well without complaint.  Continues to teach water aerobics.   Past Medical History:  Diagnosis Date  . Bradycardia   . Coronary artery disease    a. s/p STEMI in 2014 with DES to LAD with staged PCI/DES placement  to LCx  . Echoencephalogram abnormality   . Heart murmur   . History of Holter monitoring   . Hyperlipidemia   . Hypertension   . Myocardial infarction ([place])   . NSVT (nonsustained ventricular tachycardia) ([place])   . Personal history of chemotherapy    ENDOMETRIAL CA  . Personal history of radiation therapy    ENDOMETRIAL CA  . Uterine cancer ([place])    serrous   Past Surgical History:  Procedure Laterality Date  . ABDOMINAL HYSTERECTOMY    . BREAST EXCISIONAL BIOPSY Right 1956   NEG  . LEFT HEART CATHETERIZATION WITH CORONARY ANGIOGRAM  10/19/2012   Procedure: LEFT HEART CATHETERIZATION WITH CORONARY ANGIOGRAM;  Surgeon: Troy Sine, MD;  Location: [facility] CATH LAB;  Service: Cardiovascular;;  . PERCUTANEOUS CORONARY STENT INTERVENTION (PCI-S)  10/19/2012   Procedure: PERCUTANEOUS CORONARY STENT INTERVENTION (PCI-S);  Surgeon: Troy Sine, MD;  Location[facility] C[facility] LAB;  Service: Cardiovascular;;  . PERCUTANEOUS CORONARY STENT INTERVENTION (PCI-S) N/A 10/23/2012   Procedure: PERCUTANEOUS CORONARY STENT INTERVENTION (PCI-S);  Surgeon: Troy Sine, MD;  Location: [facility] CATH LAB;  Service: Cardiovascular;  Laterality: N/A;  . PERIPHERAL VASCULAR CATHETERIZATION N/A 05/05/2015   Procedure: Glori Luis Cath Insertion;  Surgeon: Algernon Huxley, MD;  Location: [facility] CV LAB;  Service: Cardiovascular;  Laterality: N/A;  . PORTA CATH REMOVAL N/A 04/05/2017   Procedure: PORTA CATH REMOVAL;  Surgeon: Algernon Huxley, MD;  Location: [facility] CV LAB;  Service: Cardiovascular;  Laterality: N/A;     Current Outpatient  Medications  Medication Sig Dispense Refill  . acetaminophen (TYLENOL) 650 MG CR tablet Take 650 mg by mouth every 8 (eight) hours as needed for pain.    . Alpha-Lipoic Acid 100 MG CAPS Take 1 capsule by mouth every morning.    . Ascorbic Acid (VITAMIN C) 1000 MG tablet Take 1,000 mg by mouth daily.    [name] aspirin EC 81 MG tablet Take 81 mg by mouth daily.    [name] atorvastatin (LIPITOR) 80 MG  tablet TAKE 1 TABLET (80 MG TOTAL) BY MOUTH DAILY AT 6 PM. 90 tablet 1  . BRILINTA 60 MG TABS tablet TAKE 1 TABLET BY MOUTH TWICE A DAY 60 tablet 9  . ezetimibe (ZETIA) 10 MG tablet Take 1 tablet (10 mg total) by mouth daily. 90 tablet 3  . fluticasone (FLONASE) 50 MCG/ACT nasal spray Place 2 sprays into both nostrils daily.     . furosemide (LASIX) 20 MG tablet Take 20 mg by mouth 3 (three) times a week.    . isosorbide mononitrate (IMDUR) 30 MG 24 hr tablet TAKE 1 TABLET (30 MG TOTAL) BY MOUTH DAILY. 90 tablet 1  . loperamide (IMODIUM A-D) 2 MG tablet Take 2 mg by mouth 4 (four) times daily as needed for diarrhea or loose stools.    [name] losartan (COZAAR) 50 MG tablet TAKE 1 TABLET BY MOUTH EVERY DAY (Patient taking differently: TAKE 1 TABLET BY MOUTH EVERY DAY AT NIGHT.) 90 tablet 3  . Multiple Vitamin (MULTIVITAMIN WITH MINERALS) TABS Take 1 tablet by mouth daily.    [name] NITROSTAT 0.4 MG SL tablet PLACE 1 TABLET UNDER THE TOUGUE EVERY 5 MINUTES FOR UP TO 3 DOSES AS NEEDED FOR CHEST PAIN 25 tablet 0  . omeprazole (PRILOSEC) 20 MG capsule Take 20 mg by mouth daily.    . vitamin B-12 (CYANOCOBALAMIN) 1000 MCG tablet Take 1,000 mcg by mouth daily.    . Vitamin D, Cholecalciferol, 1000 UNITS CAPS Take 1,000 Units by mouth daily.      No current facility-administered medications for this visit.    Facility-Administered Medications Ordered in Other Visits  Medication Dose Route Frequency Provider Last Rate Last Dose  . sodium chloride flush (NS) 0.9 % injection 10 mL  10 mL Intravenous PRN Cammie Sickle, MD   10 mL at 06/06/16 1447  . sodium chloride flush (NS) 0.9 % injection 10 mL  10 mL Intravenous PRN Cammie Sickle, MD   10 mL at 08/15/16 1404    Allergies:   Ace inhibitors and Penicillins   Social History:  The patient  reports that she quit smoking about 43 years ago. she has never used smokeless tobacco. She reports that she drinks about 0.6 oz of alcohol per week. She reports  that she does not use drugs.   Family History:  The patient's family history includes Breast cancer (age of onset: 12) in her mother; Diabetes in her brother and father; Heart disease in her father and mother; Stroke in her brother.   ROS:  Please see the history of present illness.   Otherwise, review of systems is positive for, back pain, balance problems.   All other systems are reviewed and negative.   PHYSICAL EXAM: VS:  BP (!) 142/78   Pulse (!) 58   Ht 5\' 4"  (1.626 m)   Wt 206 lb (93.4 kg)   BMI 35.36 kg/m  , BMI Body mass index is 35.36 kg/m. GEN: Well nourished, well developed, in no acute distress  HEENT: normal  Neck: no JVD, carotid bruits, or masses Cardiac: RRR; no murmurs, rubs, or gallops,no edema  Respiratory:  clear to auscultation bilaterally, normal work of breathing GI: soft, nontender, nondistended, + BS MS: no deformity or atrophy  Skin: warm and dry Neuro:  Strength and sensation are intact Psych: euthymic mood, full affect  EKG:  EKG is not ordered today. Personal review of the ekg ordered 01/17/17 shows SR, rate 59, LBBB  Recent Labs: 06/19/2016: TSH 0.97 03/08/2017: ALT 14; BUN 17; Creatinine, Ser 1.04; Hemoglobin 11.7; Platelets 230; Potassium 4.0; Sodium 138    Lipid Panel     Component Value Date/Time   CHOL 120 06/19/2016 0950   CHOL 141 04/16/2013 0924   TRIG 99 06/19/2016 0950   TRIG 112 04/16/2013 0924   HDL 54 06/19/2016 0950   HDL 53 04/16/2013 0924   CHOLHDL 2.2 06/19/2016 0950   VLDL 20 06/19/2016 0950   LDLCALC 46 06/19/2016 0950   LDLCALC 66 04/16/2013 0924     Wt Readings from Last 3 Encounters:  04/24/17 206 lb (93.4 kg)  04/10/17 219 lb (99.3 kg)  04/05/17 210 lb (95.3 kg)      Other studies Reviewed: Additional studies/ records that were reviewed today include: TTE 01/04/17 Review of the above records today demonstrates:  - Left ventricle: The cavity size was normal. There was moderate   concentric hypertrophy.  Systolic function was mildly to   moderately reduced. The estimated ejection fraction was in the   range of 40% to 45%. Severe hypokinesis of the inferior,   inferoseptal, and apical myocardium. Mild anteroseptal and   inferolateral hypokinesis. Doppler parameters are consistent with   abnormal left ventricular relaxation (grade 1 diastolic   dysfunction). Doppler parameters are consistent with high   ventricular filling pressure. - Aortic valve: There was mild stenosis. Peak velocity (S): 238   cm/s. Mean gradient (S): 12 mm Hg. Valve area (VTI): 2.7 cm^2.   Valve area (Vmax): 2.18 cm^2. Valve area (Vmean): 2.39 cm^2. - Mitral valve: Calcified annulus. Severe thickening and   calcification. Transvalvular velocity was within the normal   range. There was no evidence for stenosis. There was mild   regurgitation. Valve area by continuity equation (using LVOT   flow): 3.08 cm^2. - Left atrium: The atrium was severely dilated. - Right ventricle: The cavity size was normal. Wall thickness was   normal. Systolic function was normal. - Tricuspid valve: There was no regurgitation. - Pulmonary arteries: Systolic pressure was within the normal   range. PA peak pressure: 32 mm Hg (S). - Pericardium, extracardiac: A trivial pericardial effusion was   identified.  Holter 01/17/17 - personally reviewed Markedly abnormal monitor demonstrating predominantly underlying atrial fibrillation with slow ventricular rate.  The average heart beat was 53 bpm.  The maximum heart rate was 109 bpm.  During the monitoring period, the patient had significant bradycardia with heart rates down in the mid to low 30s.  Th there were 2 episodes with heart rates below 30 at 25 bpm  and at 19 bpm immediately thereafter at 9:57 PM on 01/10/2017.  On 01/11/2017 at 7:47 AM heart rate was 24 bpm.   ASSESSMENT AND PLAN:  1.  Sinus bradycardia at rest: Be bradycardic.  Wore a cardiac monitor that showed significant pauses.  She  has been asymptomatic.  She does not wish to have a pacemaker implanted at this time due to her lack of symptoms.  I have told her about warning  symptoms including fatigue, weakness, shortness of breath, and syncope.  She Wagner Tanzi keep an eye on these and Amandalee Lacap call us back if she has any.  We Zuma Hust see her back in 6 months.  2. Coronary artery disease: Stable and asymptomatic.  No changes.    Current medicines are reviewed at length with the patient today.   The patient does not have concerns regarding her medicines.  The following changes were made today: None  Labs/ tests ordered today include: none  No orders of the defined types were placed in this encounter.    Disposition:   FU with Nieves Chapa 6 months  Signed, Consuela Widener Meredith Leeds, MD  04/24/2017 2:53 PM     [facility] [address] [place]  [address] [phone] (office) [phone] (fax)

## 2017-05-01 NOTE — Telephone Encounter (Signed)
Declines RF

## 2017-05-11 ENCOUNTER — Ambulatory Visit
Admission: RE | Admit: 2017-05-11 | Discharge: 2017-05-11 | Disposition: A | Discharge status: Home / Self Care | Payer: Medicare Other | Source: Ambulatory Visit | Attending: Family Medicine | Admitting: Family Medicine

## 2017-05-11 DIAGNOSIS — Z1231 Encounter for screening mammogram for malignant neoplasm of breast: Secondary | ICD-10-CM | POA: Diagnosis present

## 2017-05-11 DIAGNOSIS — Z1239 Encounter for other screening for malignant neoplasm of breast: Secondary | ICD-10-CM

## 2017-05-15 ENCOUNTER — Other Ambulatory Visit: Payer: Self-pay | Admitting: Cardiovascular Disease

## 2017-05-15 ENCOUNTER — Other Ambulatory Visit: Payer: Self-pay

## 2017-05-15 DIAGNOSIS — I2510 Atherosclerotic heart disease of native coronary artery without angina pectoris: Secondary | ICD-10-CM

## 2017-05-15 MED ORDER — EZETIMIBE 10 MG PO TABS: 10.0000 mg | ORAL_TABLET | Freq: Every day | ORAL | 0 refills | Status: DC

## 2017-06-04 ENCOUNTER — Ambulatory Visit: Payer: Medicare Other | Admitting: Cardiovascular Disease

## 2017-06-07 LAB — LIPID PANEL
CHOL/HDL RATIO: 2.3 ratio (ref 0.0–4.4)
Cholesterol, Total: 116 mg/dL (ref 100–199)
HDL: 51 mg/dL (ref >39)
LDL CALC: 50 mg/dL (ref 0–99)
Triglycerides: 75 mg/dL (ref 0–149)
VLDL Cholesterol Cal: 15 mg/dL (ref 5–40)

## 2017-06-07 LAB — HEPATIC FUNCTION PANEL
ALBUMIN: 3.9 g/dL (ref 3.5–4.7)
ALT: 13 IU/L (ref 0–32)
AST: 23 IU/L (ref 0–40)
Alkaline Phosphatase: 102 IU/L (ref 39–117)
BILIRUBIN TOTAL: 0.4 mg/dL (ref 0.0–1.2)
Bilirubin, Direct: 0.15 mg/dL (ref 0.00–0.40)
Total Protein: 6.4 g/dL (ref 6.0–8.5)

## 2017-06-15 ENCOUNTER — Ambulatory Visit: Payer: Medicare Other | Admitting: Cardiovascular Disease

## 2017-06-15 ENCOUNTER — Encounter: Payer: Self-pay | Admitting: Cardiovascular Disease

## 2017-06-15 VITALS — BP 112/68 | HR 67 | Ht 64.0 in | Wt 203.6 lb

## 2017-06-15 DIAGNOSIS — I252 Old myocardial infarction: Secondary | ICD-10-CM

## 2017-06-15 DIAGNOSIS — I10 Essential (primary) hypertension: Secondary | ICD-10-CM

## 2017-06-15 DIAGNOSIS — I447 Left bundle-branch block, unspecified: Secondary | ICD-10-CM

## 2017-06-15 DIAGNOSIS — G4733 Obstructive sleep apnea (adult) (pediatric): Secondary | ICD-10-CM | POA: Diagnosis not present

## 2017-06-15 DIAGNOSIS — I255 Ischemic cardiomyopathy: Secondary | ICD-10-CM | POA: Diagnosis not present

## 2017-06-15 DIAGNOSIS — R32 Unspecified urinary incontinence: Secondary | ICD-10-CM | POA: Diagnosis not present

## 2017-06-15 DIAGNOSIS — I480 Paroxysmal atrial fibrillation: Secondary | ICD-10-CM | POA: Diagnosis not present

## 2017-06-15 MED ORDER — APIXABAN 5 MG PO TABS: 5.0000 mg | ORAL_TABLET | Freq: Two times a day (BID) | ORAL | 6 refills | Status: DC

## 2017-06-15 NOTE — Patient Instructions (Addendum)
MEDICATION INSTRUCTIONS  STOP BRILINTA  START ELIQUIS 5 MG  ONE TABLET TWICE A DAY  ONLY TAKE COLD MEDICATION  WITH OUT DECONGESTANT LIKE [place] , ROBITUSSIN   TEST  SCHEDULE AT [facility] [facility] Your physician has requested that you have an echocardiogram. Echocardiography is a painless test that uses sound waves to create images of your heart. It provides your doctor with information about the size and shape of your heart and how well your heart's chambers and valves are working. This procedure takes approximately one hour. There are no restrictions for this procedure. Prior to appt with Dr Cora Daniels have been referred to  [facility]   Your physician recommends that you schedule a follow-up appointment in 2 [place].    Atrial Fibrillation Atrial fibrillation is a type of heartbeat that is irregular or fast (rapid). If you have this condition, your heart keeps quivering in a weird (chaotic) way. This condition can make it so your heart cannot pump blood normally. Having this condition gives a person more risk for stroke, heart failure, and other heart problems. There are different types of atrial fibrillation. Talk with your doctor to learn about the type that you have. Follow these instructions at home:  Take over-the-counter and prescription medicines only as told by your doctor.  If your doctor prescribed a blood-thinning medicine, take it exactly as told. Taking too much of it can cause bleeding. If you do not take enough of it, you will not have the protection that you need against stroke and other problems.  Do not use any tobacco products. These include cigarettes, chewing tobacco, and e-cigarettes. If you need help quitting, ask your doctor.  If you have apnea (obstructive sleep apnea), manage it as told by your doctor.  Do not drink alcohol.  Do not drink beverages that have caffeine. These include coffee, soda, and  tea.  Maintain a healthy weight. Do not use diet pills unless your doctor says they are safe for you. Diet pills may make heart problems worse.  Follow diet instructions as told by your doctor.  Exercise regularly as told by your doctor.  Keep all follow-up visits as told by your doctor. This is important. Contact a doctor if:  You notice a change in the speed, rhythm, or strength of your heartbeat.  You are taking a blood-thinning medicine and you notice more bruising.  You get tired more easily when you move or exercise. Get help right away if:  You have pain in your chest or your belly (abdomen).  You have sweating or weakness.  You feel sick to your stomach (nauseous).  You notice blood in your throw up (vomit), poop (stool), or pee (urine).  You are short of breath.  You suddenly have swollen feet and ankles.  You feel dizzy.  Your suddenly get weak or numb in your face, arms, or legs, especially if it happens on one side of your body.  You have trouble talking, trouble understanding, or both.  Your face or your eyelid droops on one side. These symptoms may be an emergency. Do not wait to see if the symptoms will go away. Get medical help right away. Call your local emergency services (911 in the [place] Do not drive yourself to the hospital. This information is not intended to replace advice given to you by your health care provider. Make sure you discuss any questions you have with  your health care provider. Document Released: 12/21/2007 Document Revised: 08/19/2015 Document Reviewed: 07/08/2014 Elsevier Interactive Patient Education  [employer].

## 2017-06-15 NOTE — Progress Notes (Signed)
[name] ID: [name], female   DOB: Jun 27, 1934, 83 y.o.   MRN: [id number]      HPI: Susan [name] is a 83 y.o. female who presents to Susan office today for a 5 month follow-up cardiology evaluation.  Susan [name] is originally from [place], [place] and now resides in [place]. On 10/19/2012 she presented [facility] in transfer from [facility] with an anterior wall ST segment elevation myocardial infarction. She had chest pain of greater than 12 hours duration. I performed emergent cardiac catheterization  where her LAD was found to be totally occluded.  She underwent stenting of her LAD extending from Susan ostium to mid region with 3.0x38 mm and 3.0x16 mm Promus Premier DES stents. Initially she had significant wall motion abnormality involving Susan LAD territory. She also was found to have a 95% mid AV groove circumflex stenosis.  On 10/23/2012 she underwent successful staged intervention with insertion of a 2.25x12 mm Promus premier DES stent into Susan circumflex vessel. Subsequently she had done well. She did develop some hypotension leading to reduction in some of her medications.  She was enrolled in [facility] program. An exercise Myoview study on 12/03/2012 demonstrated significant salvage of myocardium with only a small area of distal antero-apical, apical, septal and  apical scar without evidence for ischemia. Post-rest ejection fraction was 66% and there was evidence for apical akinesis. Laboratory revealed an increased LDL particle number at 1352 despite being on atorvastatin 80 mg. She was contacted and Zetia 10 mg was just added to this atorvastatin dose. She did have 735 small LDL particles and a calculated LDL of 90 with HDL 40 and triglycerides 119.  She had developed hematuria and was found to have left kidney stones.  She underwent lithotripsy for her multiple kidney stones. A nuclear perfusion study prior to potential  discontinuance of dual anti-platelet therapy on 11/05/2013  was interpreted as low risk, but did show extensive scar in Susan distal LAD to return distribution.  Post-rest ejection fraction was 40%.  There was evidence for apical akinesis/mild dyskinesis.  She has been documented to have episodes of nonsustained ventricular tachycardia and has seen Dr. commits..  Her bradycardia has been more profound while sleeping.  She was able to exercise and walk without limitation other than back discomfort.  She was evaluated for possible indication for pacemaker was not felt that a pacemaker is indicated presently.  Because of her bradycardia she has not been on beta blocker therapy.  She has had episodes of nonsustained VT noted on a CardioNet monitor.  When I I last saw her admited that her sleep was poor and nonrestorative.  She snored loudly and required frequent daytime naps.  He was sleeping at least 8 hours per night, but poor quality.  She underwent a split-night protocol sleep study on 03/08/2015 and was found to have severe sleep apnea with an AHI of 35.8 overall and more severe with REM sleep with an AHI of 67.5 per hour.  She had significant oxygen desaturation to a nadir of 77%.  There was loud snoring.  I recommended tai chi CPAP therapy at 9 cm water pressure.  Since initiating CPAP therapy.  She has felt well.  She is no longer having to take naps.  She is using choice for DME company.  When I saw her one year ago, she was meeting Medicare compliance. A download was obtained from 05/01/2015 through 05/30/2015 with usage stays at 93% and 70%  of days greater than 4 hours.  Her AHI was excellent at 1.9 with a set pressure of 9 cm.    Over Susan past year, she was diagnosed with endometrial cancer and underwent a total hysterectomy.  She also has a Port-A-Cath that had undergone chemotherapy and also completed 25 radiation remains to her body and 3 intravaginally..  She has developed a neuropathy for which he  takes gabapentin.  He is unaware of chest pain or palpitations.    This past year, she continues to admit to 100% use with CPAP.  A download from 06/12/2016 through 06/13/2016 100% of usage stays.  She has a ResMed AirSense10 AutoSet unit set at a pressure of 9 cm.  Her AHI is 1.1.  She is using a Risk manager &Paykel Eson nasal mask.  There is no leak.    Since I last saw her in March 2018, she has done well from a cardiac standpoint.  She denies chest pain, PND, orthopnea, or palpitations.  She continues to teach water aerobics.  However, she has continued to have issues as result of her radiation and chemotherapy for her endometrial CA.  She has developed a neuropathy and as result, has been walking with a cane or a walker.  Her gabapentin dose has been increased to 3 times per day.  She also has a small fracture in her sacrum.  She underwent initial evaluation at [facility] for possible kyphoplasty, but ultimately this was not done.  She also has degenerative disc disease in L3-L5 for which she has been seen pain management.  She has undergone injection.  She we having an additional injection next week and will need to hold Brilinta.  She also has had radiation-induced urinary incontinence.  A recent CT has demonstrated 5 small nodules in her long and she will be undergoing a follow-up CT in December.  She continues to use CPAP with 100% compliance.  A download  from 11/27/2016 -12/26/2016 confirmed 100% compliance . AHI was1.2 at her 9 cm water pressure.  She was averaging 7 hours and 57 minutes of sleep per night.  An Epworth Sleepiness Scale score was 1.  Since I last saw her on October 3, 018 she she was found to have significant bradycardia on event monitor.  She had heart rates down to Susan mid to low 30s.  There were 2 episodes where her heart rate dropped to 25 bpm and then 19 bpm.  She was not on any AV nodal blocking drugs.  I recommended that she go to Susan emergency room for evaluation.  She was admitted  overnight.  She was evaluated by [name] Susan following day.  He reviewed her Holter monitor.  At that time, no AF was appreciated but there were periods of Mobitz 1 block, rates in Susan 20s with pauses and junctional escape beats.  Her telemetry on that particular day was showing sinus bradycardia in Susan 40s-50s.  Susan [name] admitted that she was entirely asymptomatic.  At that point Susan decision was made not to place a permanent pacemaker.  She subsequently saw [facility] in office follow-up in January 2019.  2 weeks ago, Susan [name] admits to experiencing a croup-like cough.  Over this time.  She admits to being much more tired and sleepy.  She did not use CPAP when she was coughing significantly.  She also was using Mucinex DM for her chest congestion.  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  She has issues with back discomfort.  She presents for reevaluation.  Past Medical History:  Diagnosis Date  . Bradycardia   . Coronary artery disease    a. s/p STEMI in 2014 with DES to LAD with staged PCI/DES placement to LCx  . Echoencephalogram abnormality   . Heart murmur   . History of Holter monitoring   . Hyperlipidemia   . Hypertension   . Myocardial infarction ([place])   . NSVT (nonsustained ventricular tachycardia) ([place])   . Personal history of chemotherapy    ENDOMETRIAL CA  . Personal history of radiation therapy    ENDOMETRIAL CA  . Uterine cancer ([place])    serrous    Past Surgical History:  Procedure Laterality Date  . ABDOMINAL HYSTERECTOMY    . BREAST EXCISIONAL BIOPSY Right 1956   NEG  . LEFT HEART CATHETERIZATION WITH CORONARY ANGIOGRAM  10/19/2012   Procedure: LEFT HEART CATHETERIZATION WITH CORONARY ANGIOGRAM;  Surgeon: Troy Sine, MD;  Location: [facility] CATH LAB;  Service: Cardiovascular;;  . PERCUTANEOUS CORONARY STENT INTERVENTION (PCI-S)  10/19/2012   Procedure: PERCUTANEOUS CORONARY STENT INTERVENTION  (PCI-S);  Surgeon: Troy Sine, MD;  Location: [facility] CATH LAB;  Service: Cardiovascular;;  . PERCUTANEOUS CORONARY STENT INTERVENTION (PCI-S) N/A 10/23/2012   Procedure: PERCUTANEOUS CORONARY STENT INTERVENTION (PCI-S);  Surgeon: Troy Sine, MD;  Location: [facility] CATH LAB;  Service: Cardiovascular;  Laterality: N/A;  . PERIPHERAL VASCULAR CATHETERIZATION N/A 05/05/2015   Procedure: Glori Luis Cath Insertion;  Surgeon: Algernon Huxley, MD;  Location: [place] CV LAB;  Service: Cardiovascular;  Laterality: N/A;  . PORTA CATH REMOVAL N/A 04/05/2017   Procedure: PORTA CATH REMOVAL;  Surgeon: Algernon Huxley, MD;  Location: [place] CV LAB;  Service: Cardiovascular;  Laterality: N/A;    Allergies  Allergen Reactions  . Ace Inhibitors Cough  . Penicillins Rash    Has [name] had a PCN reaction causing immediate rash, facial/tongue/throat swelling, SOB or lightheadedness with hypotension: No Has [name] had a PCN reaction causing severe rash involving mucus membranes or skin necrosis: No Has [name] had a PCN reaction that required hospitalization: No Has [name] had a PCN reaction occurring within Susan last 10 years: No If all of Susan above answers are "NO", then may proceed with Cephalosporin use.     Current Outpatient Medications  Medication Sig Dispense Refill  . acetaminophen (TYLENOL) 650 MG CR tablet Take 650 mg by mouth every 8 (eight) hours as needed for pain.    . Alpha-Lipoic Acid 100 MG CAPS Take 1 capsule by mouth at bedtime.     . Ascorbic Acid (VITAMIN C) 1000 MG tablet Take 1,000 mg by mouth daily.    [name] aspirin EC 81 MG tablet Take 81 mg by mouth daily.    [name] atorvastatin (LIPITOR) 80 MG tablet TAKE 1 TABLET (80 MG TOTAL) BY MOUTH DAILY AT 6 PM. 90 tablet 1  . ezetimibe (ZETIA) 10 MG tablet Take 1 tablet (10 mg total) by mouth daily. 30 tablet 0  . fluticasone (FLONASE) 50 MCG/ACT nasal spray Place 2 sprays into both nostrils daily.     . furosemide (LASIX) 20 MG tablet Take 20 mg by  mouth as directed.     . isosorbide mononitrate (IMDUR) 30 MG 24 hr tablet TAKE 1 TABLET (30 MG TOTAL) BY MOUTH DAILY. 90 tablet 1  . loperamide (IMODIUM A-D) 2 MG tablet Take 2 mg by mouth 4 (four) times daily as needed for diarrhea or loose  stools.    [name] losartan (COZAAR) 50 MG tablet TAKE 1 TABLET BY MOUTH EVERY DAY ([name] taking differently: TAKE 1 TABLET BY MOUTH EVERY DAY AT NIGHT.) 90 tablet 3  . Multiple Vitamin (MULTIVITAMIN WITH MINERALS) TABS Take 1 tablet by mouth daily.    [name] NITROSTAT 0.4 MG SL tablet PLACE 1 TABLET UNDER Susan TOUGUE EVERY 5 MINUTES FOR UP TO 3 DOSES AS NEEDED FOR CHEST PAIN 25 tablet 0  . omeprazole (PRILOSEC) 20 MG capsule Take 20 mg by mouth daily.    . vitamin B-12 (CYANOCOBALAMIN) 1000 MCG tablet Take 1,000 mcg by mouth daily.    [name] apixaban (ELIQUIS) 5 MG TABS tablet Take 1 tablet (5 mg total) by mouth 2 (two) times daily. 60 tablet 6   No current facility-administered medications for this visit.    Facility-Administered Medications Ordered in Other Visits  Medication Dose Route Frequency Provider Last Rate Last Dose  . sodium chloride flush (NS) 0.9 % injection 10 mL  10 mL Intravenous PRN Cammie Sickle, MD   10 mL at 06/06/16 1447  . sodium chloride flush (NS) 0.9 % injection 10 mL  10 mL Intravenous PRN Cammie Sickle, MD   10 mL at 08/15/16 1404    Social History   Socioeconomic History  . Marital status: Married    Spouse name: Not on file  . Number of children: Not on file  . Years of education: Not on file  . Highest education level: Not on file  Occupational History  . Not on file  Social Needs  . Financial resource strain: Not on file  . Food insecurity:    Worry: Not on file    Inability: Not on file  . Transportation needs:    Medical: Not on file    Non-medical: Not on file  Tobacco Use  . Smoking status: Former Smoker    Last attempt to quit: 1976    Years since quitting: 43.2  . Smokeless tobacco: Never Used    Substance and Sexual Activity  . Alcohol use: Yes    Alcohol/week: 0.6 oz    Types: 1 Glasses of wine per week  . Drug use: No  . Sexual activity: Yes  Lifestyle  . Physical activity:    Days per week: Not on file    Minutes per session: Not on file  . Stress: Not on file  Relationships  . Social connections:    Talks on phone: Not on file    Gets together: Not on file    Attends religious service: Not on file    Active member of club or organization: Not on file    Attends meetings of clubs or organizations: Not on file    Relationship status: Not on file  . Intimate partner violence:    Fear of current or ex partner: Not on file    Emotionally abused: Not on file    Physically abused: Not on file    Forced sexual activity: Not on file  Other Topics Concern  . Not on file  Social History Narrative  . Not on file    Socially she is married. There is no tobacco use. She drinks alcohol rarely.  Family History  Problem Relation Age of Onset  . Heart disease Mother   . Breast cancer Mother 48  . Heart disease Father   . Diabetes Father   . Diabetes Brother   . Stroke Brother   . Kidney cancer Neg Hx   .  Bladder Cancer Neg Hx     ROS General: Negative; No fevers, chills, or night sweats;  HEENT: Negative; No changes in vision or hearing, sinus congestion, difficulty swallowing Pulmonary: Negative; No cough, wheezing, shortness of breath, hemoptysis Cardiovascular:  See HPI GI: Positive for GERD; No nausea, vomiting, diarrhea, or abdominal pain [id number] of previous kidney stones, contributing to left flank pain intermittently; No dysuria, hematuria, or difficulty voiding; radiation-induced urinary incontinence Musculoskeletal: Positive for small sacral fracture, L3-L5 degenerative disc disease. Hematologic/Oncology: Positive for endometrial cancer, status post chemotherapy and radiation treatments. Endocrine: Negative; no heat/cold intolerance; no diabetes Neuro:  Negative; no changes in balance, headaches Skin: Negative; No rashes or skin lesions Psychiatric: Negative; No behavioral problems, depression Sleep: Positive for obstructive sleep apnea, on CPAP therapy.  No breakthrough snoring.  No daytime sleepiness. Other comprehensive 14 point system review is negative.   PE BP 112/68   Pulse 67   Ht '5\' 4"'  (1.626 m)   Wt 203 lb 9.6 oz (92.4 kg)   BMI 34.95 kg/m    Repeat blood pressure by me was 110/70  Wt Readings from Last 3 Encounters:  06/15/17 203 lb 9.6 oz (92.4 kg)  04/24/17 206 lb (93.4 kg)  04/10/17 219 lb (99.3 kg)    General: Alert, oriented, no distress.  Skin: normal turgor, no rashes, warm and dry HEENT: Normocephalic, atraumatic. Pupils equal round and reactive to light; sclera anicteric; extraocular muscles intact;  Nose without nasal septal hypertrophy Mouth/Parynx benign; Mallinpatti scale 3 Neck: No JVD, no carotid bruits; normal carotid upstroke Lungs: clear to ausculatation and percussion; no wheezing or rales Chest wall: without tenderness to palpitation Heart: PMI not displaced, irregularly irregular consistent with atrial fibrillation with controlled rate in Susan 60s and 70s, s1 s2 normal, 1/6 systolic murmur, no diastolic murmur, no rubs, gallops, thrills, or heaves Abdomen: soft, nontender; no hepatosplenomehaly, BS+; abdominal aorta nontender and not dilated by palpation. Back: no CVA tenderness Pulses 2+ Musculoskeletal: full range of motion, normal strength, no joint deformities Extremities: Trivial ankle edema; no clubbing cyanosis, Homan's sign negative  Neurologic: grossly nonfocal; Cranial nerves grossly wnl Psychologic: Normal mood and affect   ECG (independently read by me): Atrial fibrillation at 67 bpm.  Left bundle branch block. Variable rate ranging from probably in Susan , low 40s to approximately 88  October 2018 ECG (independently read by me): Marked sinus bradycardia with PACs.  Ventricular rate  at 43.  Left bundle branch block with repolarization changes.  March 2018 ECG (independently read by me): Sinus bradycardia 57 bpm.  First-degree AV block with a PR interval 314 ms. .She now has a left bundle-branch block which is new since her prior tracing.  March 2017 ECG (independently read by me): Sinus bradycardia 52 bpm with first-degree block.  PR interval 284 ms.  LVH by voltage.  QS V1 through V3 concordant with old anteroseptal MI.  March 2016 ECG (independently read by me): Normal sinus rhythm.  Old anteroseptal MI.  First-degree AV block.  October 27, 2013 ECG (independently read by me): Normal sinus rhythm at 60 beats per minute.  First degree AV block with PR interval at 276 ms.  Old anteroseptal MI with QS complex in V1 and V2.  EKG (independently read by me): Sinus rhythm at 55 beats per minute. First degree AV block. Poor progression compatible with her prior septal infarction. T-wave changes anterolaterally.  LABS: BMP Latest Ref Rng & Units 03/08/2017 01/17/2017 12/07/2016  Glucose 65 - 99 mg/dL 142(H)  102(H) 114(H)  BUN 6 - 20 mg/dL '17 17 18  ' Creatinine 0.44 - 1.00 mg/dL 1.04(H) 1.02(H) 0.89  Sodium 135 - 145 mmol/L 138 140 139  Potassium 3.5 - 5.1 mmol/L 4.0 3.8 3.9  Chloride 101 - 111 mmol/L 105 104 106  CO2 22 - 32 mmol/L '26 26 25  ' Calcium 8.9 - 10.3 mg/dL 9.3 9.8 9.7   Hepatic Function Latest Ref Rng & Units 06/06/2017 03/08/2017 12/07/2016  Total Protein 6.0 - 8.5 g/dL 6.4 6.4(L) 6.7  Albumin 3.5 - 4.7 g/dL 3.9 3.8 4.0  AST 0 - 40 IU/L 23 26 33  ALT 0 - 32 IU/L '13 14 16  ' Alk Phosphatase 39 - 117 IU/L 102 110 110  Total Bilirubin 0.0 - 1.2 mg/dL 0.4 0.6 0.7  Bilirubin, Direct 0.00 - 0.40 mg/dL 0.15 - -   CBC Latest Ref Rng & Units 03/08/2017 01/17/2017 12/07/2016  WBC 3.6 - 11.0 K/uL 5.0 6.3 5.2  Hemoglobin 12.0 - 16.0 g/dL 11.7(L) 13.0 12.2  Hematocrit 35.0 - 47.0 % 35.1 38.7 35.0  Platelets 150 - 440 K/uL 230 233 243   Lab Results  Component Value Date    MCV 91.8 03/08/2017   MCV 91.0 01/17/2017   MCV 89.4 12/07/2016   Lab Results  Component Value Date   TSH 0.97 06/19/2016   Lab Results  Component Value Date   HGBA1C 5.7 (H) 10/19/2012   Lipid Panel     Component Value Date/Time   CHOL 116 06/06/2017 0922   CHOL 141 04/16/2013 0924   TRIG 75 06/06/2017 0922   TRIG 112 04/16/2013 0924   HDL 51 06/06/2017 0922   HDL 53 04/16/2013 0924   CHOLHDL 2.3 06/06/2017 0922   CHOLHDL 2.2 06/19/2016 0950   VLDL 20 06/19/2016 0950   LDLCALC 50 06/06/2017 0922   LDLCALC 66 04/16/2013 0924   RADIOLOGY: No results found.  IMPRESSION:  1. Paroxysmal A-fib (HCC)   2. Cardiomyopathy, ischemic: EF 35-40% by echo post-MI 10/20/12   3. OSA (obstructive sleep apnea)   4. Left bundle branch block   5. Essential hypertension   6. Urinary incontinence, unspecified type   7. Old MI (myocardial infarction): Anterior STEMI 10/19/2012     ASSESSMENT AND PLAN: Susan [name] is a 83 year-old female who presented to [facility] on [facility]2012 with a STEMI after an approximate 12 hour delay with back and chest discomfort. When she arrived to [facility] she had excellent door to balloon time at 27 minutes in Susan setting of an anterior wall ST segment elevation myocardial infarction. She underwent successful stenting of her LAD and because of diffuse disease tandem DES stents were inserted.  She underwent staged intervention to Susan circumflex  4 days later.. She had concomitant 50 - 60% RCA narrowing and a large dominant right coronary artery.  She has a history of ACE inhibitor induced cough, but has tolerated ARB therapy with losartan .  A nuclear perfusion study  demonstrated significant salvage of myocardium in Susan majority of Susan LAD territory and showed distal apical scar.  She has chronic left bundle branch block.  She continues to be on dual antiplatelet therapy and now is on Susan reduced dose of Brilinta per [employer] trial data.  She has  probable sick sinus syndrome and has been demonstrated to have marked sinus pauses in Susan past with heart rates under 30 and as low as 19 bpm.  Presently, she is not on any rate control medications.  Her ECG today however, shows that she is back in atrial fibrillation.  With her history of marked bradycardia she is not a candidate for beta-blocker or Cardizem.  I am recommending she discontinue Brilinta.  I am starting her on Eliquis 5 mg twice a day for anticoagulation.  I am scheduling her for an echo Doppler study to reassess systolic and diastolic function.  She had not been using CPAP over Susan last several weeks and I recommended resumption with 100% compliance.  I have recommended she discontinue Mucinex DM but can take over-Susan-counter regular Mucinex.  I am recommending a reevaluation with [name] after 3-4 weeks of anticoagulation therapy.  With her recurrent AF she may very well be a candidate for permanent pacemaker implantation in light of her previous documentation of severe bradycardia arrhythmia.   Time spent: 30 minutes Troy Sine, MD, [facility]  06/17/2017 12:03 PM

## 2017-06-17 ENCOUNTER — Encounter: Payer: Self-pay | Admitting: Cardiovascular Disease

## 2017-06-27 ENCOUNTER — Ambulatory Visit (HOSPITAL_COMMUNITY): Payer: Medicare Other | Attending: Cardiovascular Disease

## 2017-06-27 ENCOUNTER — Other Ambulatory Visit: Payer: Self-pay

## 2017-06-27 DIAGNOSIS — I350 Nonrheumatic aortic (valve) stenosis: Secondary | ICD-10-CM | POA: Insufficient documentation

## 2017-06-27 DIAGNOSIS — I480 Paroxysmal atrial fibrillation: Secondary | ICD-10-CM | POA: Insufficient documentation

## 2017-06-27 DIAGNOSIS — I495 Sick sinus syndrome: Secondary | ICD-10-CM | POA: Insufficient documentation

## 2017-06-27 DIAGNOSIS — E785 Hyperlipidemia, unspecified: Secondary | ICD-10-CM | POA: Insufficient documentation

## 2017-06-27 DIAGNOSIS — Z87891 Personal history of nicotine dependence: Secondary | ICD-10-CM | POA: Diagnosis not present

## 2017-06-27 DIAGNOSIS — I2510 Atherosclerotic heart disease of native coronary artery without angina pectoris: Secondary | ICD-10-CM | POA: Diagnosis not present

## 2017-06-27 DIAGNOSIS — I10 Essential (primary) hypertension: Secondary | ICD-10-CM | POA: Insufficient documentation

## 2017-06-27 MED ORDER — PERFLUTREN LIPID MICROSPHERE
1.0000 mL | INTRAVENOUS | Status: AC | PRN
Start: 2017-06-27 — End: 2017-06-27
  Administered 2017-06-27: 2 mL via INTRAVENOUS

## 2017-07-17 ENCOUNTER — Ambulatory Visit: Payer: Medicare Other | Admitting: Cardiology

## 2017-07-17 ENCOUNTER — Encounter: Payer: Self-pay | Admitting: Cardiology

## 2017-07-17 ENCOUNTER — Other Ambulatory Visit: Payer: Self-pay | Admitting: Cardiology

## 2017-07-17 VITALS — BP 124/80 | HR 62 | Ht 64.0 in | Wt 205.0 lb

## 2017-07-17 DIAGNOSIS — I481 Persistent atrial fibrillation: Secondary | ICD-10-CM

## 2017-07-17 DIAGNOSIS — I4819 Other persistent atrial fibrillation: Secondary | ICD-10-CM

## 2017-07-17 DIAGNOSIS — I2510 Atherosclerotic heart disease of native coronary artery without angina pectoris: Secondary | ICD-10-CM

## 2017-07-17 DIAGNOSIS — I495 Sick sinus syndrome: Secondary | ICD-10-CM

## 2017-07-17 NOTE — H&P (View-Only) (Signed)
Electrophysiology Office Note   Date:  07/17/2017   ID:  Susan [name], DOB 1934/06/02, MRN [id number]  PCP:  [name], MD  Cardiologist:  Shelva Majestic Primary Electrophysiologist:  Constance Haw, MD    Chief Complaint  Patient presents with  . Follow-up    Sinus bradycardia/Mobitz/PAF     History of Present Illness: Susan [name] is a 83 y.o. female who presents today for electrophysiology evaluation.   History of coronary artery disease. In July 2014, she presented to [facility] with an anterior wall STEMI and underwent emergent cardiac catheterization where her LAD was found to be totally occluded. She underwent stenting of her LAD eInitially she had significant wall motion abnormality involving the LAD territory. She also was found to have a 95% mid AV groove circumflex stenosis, on 10/23/2012 she underwent successful staged intervention with insertion with a DES stent into the circumflex vessel. She wore a cardiac monitor that showed heart rates as low as 20 but with sinus bradycardia in the 40s-50s and up to 2-3-second pauses.  Her heart rates were in the 60s-80s with activity.  She was sent to the emergency room, but did not get a pacemaker due to lack of symptoms.  Today, denies symptoms of palpitations, chest pain, shortness of breath, orthopnea, PND, lower extremity edema, claudication, dizziness, presyncope, syncope, bleeding, or neurologic sequela. The patient is tolerating medications without difficulties.  He complains of back pain.  She does have some minor fatigue that she feels is due to atrial fibrillation.  She is certainly having significant issues with her back which hampers her ambulation ability.   Past Medical History:  Diagnosis Date  . Bradycardia   . Coronary artery disease    a. s/p STEMI in 2014 with DES to LAD with staged PCI/DES placement to LCx  . Echoencephalogram abnormality   . Heart murmur   . History of Holter  monitoring   . Hyperlipidemia   . Hypertension   . Myocardial infarction ([place])   . NSVT (nonsustained ventricular tachycardia) ([employer])   . Personal history of chemotherapy    ENDOMETRIAL CA  . Personal history of radiation therapy    ENDOMETRIAL CA  . Uterine cancer ([place])    serrous   Past Surgical History:  Procedure Laterality Date  . ABDOMINAL HYSTERECTOMY    . BREAST EXCISIONAL BIOPSY Right 1956   NEG  . LEFT HEART CATHETERIZATION WITH CORONARY ANGIOGRAM  10/19/2012   Procedure: LEFT HEART CATHETERIZATION WITH CORONARY ANGIOGRAM;  Surgeon: Troy Sine, MD;  Location: [facility] CATH LAB;  Service: Cardiovascular;;  . PERCUTANEOUS CORONARY STENT INTERVENTION (PCI-S)  10/19/2012   Procedure: PERCUTANEOUS CORONARY STENT INTERVENTION (PCI-S);  Surgeon: Troy Sine, MD;  Location: [facility] CATH LAB;  Service: Cardiovascular;;  . PERCUTANEOUS CORONARY STENT INTERVENTION (PCI-S) N/A 10/23/2012   Procedure: PERCUTANEOUS CORONARY STENT INTERVENTION (PCI-S);  Surgeon: Troy Sine, MD;  Location: [facility] CATH LAB;  Service: Cardiovascular;  Laterality: N/A;  . PERIPHERAL VASCULAR CATHETERIZATION N/A 05/05/2015   Procedure: Glori Luis Cath Insertion;  Surgeon: Algernon Huxley, MD;  Location: [place] CV LAB;  Service: Cardiovascular;  Laterality: N/A;  . PORTA CATH REMOVAL N/A 04/05/2017   Procedure: PORTA CATH REMOVAL;  Surgeon: Algernon Huxley, MD;  Location: [place] CV LAB;  Service: Cardiovascular;  Laterality: N/A;     Current Outpatient Medications  Medication Sig Dispense Refill  . acetaminophen (TYLENOL) 650 MG CR tablet Take 650 mg by mouth every 8 (eight)  hours as needed for pain.    . Alpha-Lipoic Acid 100 MG CAPS Take 1 capsule by mouth at bedtime.     [name] apixaban (ELIQUIS) 5 MG TABS tablet Take 1 tablet (5 mg total) by mouth 2 (two) times daily. 60 tablet 6  . Ascorbic Acid (VITAMIN C) 1000 MG tablet Take 1,000 mg by mouth daily.    [name] aspirin EC 81 MG tablet Take 81 mg by mouth daily.    [name]  atorvastatin (LIPITOR) 80 MG tablet TAKE 1 TABLET (80 MG TOTAL) BY MOUTH DAILY AT 6 PM. 90 tablet 1  . ezetimibe (ZETIA) 10 MG tablet Take 1 tablet (10 mg total) by mouth daily. 30 tablet 0  . fluticasone (FLONASE) 50 MCG/ACT nasal spray Place 2 sprays into both nostrils daily.     . isosorbide mononitrate (IMDUR) 30 MG 24 hr tablet TAKE 1 TABLET (30 MG TOTAL) BY MOUTH DAILY. 90 tablet 1  . loperamide (IMODIUM A-D) 2 MG tablet Take 2 mg by mouth 4 (four) times daily as needed for diarrhea or loose stools.    [name] losartan (COZAAR) 50 MG tablet TAKE 1 TABLET BY MOUTH EVERY DAY (Patient taking differently: TAKE 1 TABLET BY MOUTH EVERY DAY AT NIGHT.) 90 tablet 3  . Multiple Vitamin (MULTIVITAMIN WITH MINERALS) TABS Take 1 tablet by mouth daily.    [name] NITROSTAT 0.4 MG SL tablet PLACE 1 TABLET UNDER THE TOUGUE EVERY 5 MINUTES FOR UP TO 3 DOSES AS NEEDED FOR CHEST PAIN 25 tablet 0  . omeprazole (PRILOSEC) 20 MG capsule Take 20 mg by mouth daily.    . vitamin B-12 (CYANOCOBALAMIN) 1000 MCG tablet Take 1,000 mcg by mouth daily.     No current facility-administered medications for this visit.    Facility-Administered Medications Ordered in Other Visits  Medication Dose Route Frequency Provider Last Rate Last Dose  . sodium chloride flush (NS) 0.9 % injection 10 mL  10 mL Intravenous PRN Cammie Sickle, MD   10 mL at 06/06/16 1447  . sodium chloride flush (NS) 0.9 % injection 10 mL  10 mL Intravenous PRN Cammie Sickle, MD   10 mL at 08/15/16 1404    Allergies:   Ace inhibitors and Penicillins   Social History:  The patient  reports that she quit smoking about 43 years ago. She has never used smokeless tobacco. She reports that she drinks about 0.6 oz of alcohol per week. She reports that she does not use drugs.   Family History:  The patient's family history includes Breast cancer (age of onset: 9) in her mother; Diabetes in her brother and father; Heart disease in her father and mother;  Stroke in her brother.   ROS:  Please see the history of present illness.   Otherwise, review of systems is positive for fatigue.   All other systems are reviewed and negative.   PHYSICAL EXAM: VS:  BP 124/80   Pulse 62   Ht 5\' 4"  (1.626 m)   Wt 205 lb (93 kg)   SpO2 97%   BMI 35.19 kg/m  , BMI Body mass index is 35.19 kg/m. GEN: Well nourished, well developed, in no acute distress  HEENT: normal  Neck: no JVD, carotid bruits, or masses Cardiac: iRRR; no murmurs, rubs, or gallops,no edema  Respiratory:  clear to auscultation bilaterally, normal work of breathing GI: soft, nontender, nondistended, + BS MS: no deformity or atrophy  Skin: warm and dry Neuro:  Strength and sensation are intact  Psych: euthymic mood, full affect  EKG:  EKG is not ordered today. Personal review of the ekg ordered 06/15/17 shows atrial fibrillation, raet 67   Recent Labs: 03/08/2017: BUN 17; Creatinine, Ser 1.04; Hemoglobin 11.7; Platelets 230; Potassium 4.0; Sodium 138 06/06/2017: ALT 13    Lipid Panel     Component Value Date/Time   CHOL 116 06/06/2017 0922   CHOL 141 04/16/2013 0924   TRIG 75 06/06/2017 0922   TRIG 112 04/16/2013 0924   HDL 51 06/06/2017 0922   HDL 53 04/16/2013 0924   CHOLHDL 2.3 06/06/2017 0922   CHOLHDL 2.2 06/19/2016 0950   VLDL 20 06/19/2016 0950   LDLCALC 50 06/06/2017 0922   LDLCALC 66 04/16/2013 0924     Wt Readings from Last 3 Encounters:  07/17/17 205 lb (93 kg)  06/15/17 203 lb 9.6 oz (92.4 kg)  04/24/17 206 lb (93.4 kg)      Other studies Reviewed: Additional studies/ records that were reviewed today include: TTE 01/04/17 Review of the above records today demonstrates:  - Left ventricle: The cavity size was normal. There was moderate   concentric hypertrophy. Systolic function was mildly to   moderately reduced. The estimated ejection fraction was in the   range of 40% to 45%. Severe hypokinesis of the inferior,   inferoseptal, and apical  myocardium. Mild anteroseptal and   inferolateral hypokinesis. Doppler parameters are consistent with   abnormal left ventricular relaxation (grade 1 diastolic   dysfunction). Doppler parameters are consistent with high   ventricular filling pressure. - Aortic valve: There was mild stenosis. Peak velocity (S): 238   cm/s. Mean gradient (S): 12 mm Hg. Valve area (VTI): 2.7 cm^2.   Valve area (Vmax): 2.18 cm^2. Valve area (Vmean): 2.39 cm^2. - Mitral valve: Calcified annulus. Severe thickening and   calcification. Transvalvular velocity was within the normal   range. There was no evidence for stenosis. There was mild   regurgitation. Valve area by continuity equation (using LVOT   flow): 3.08 cm^2. - Left atrium: The atrium was severely dilated. - Right ventricle: The cavity size was normal. Wall thickness was   normal. Systolic function was normal. - Tricuspid valve: There was no regurgitation. - Pulmonary arteries: Systolic pressure was within the normal   range. PA peak pressure: 32 mm Hg (S). - Pericardium, extracardiac: A trivial pericardial effusion was   identified.  Holter 01/17/17 - personally reviewed Markedly abnormal monitor demonstrating predominantly underlying atrial fibrillation with slow ventricular rate.  The average heart beat was 53 bpm.  The maximum heart rate was 109 bpm.  During the monitoring period, the patient had significant bradycardia with heart rates down in the mid to low 30s.  Th there were 2 episodes with heart rates below 30 at 25 bpm  and at 19 bpm immediately thereafter at 9:57 PM on 01/10/2017.  On 01/11/2017 at 7:47 AM heart rate was 24 bpm.   ASSESSMENT AND PLAN:  1.  Sick sinus syndrome: She has not bradycardic.  She has a left bundle branch block and is in atrial fibrillation.  She has been on Eliquis.  As she does not have symptoms, would hold off on pacemaker implantation at this time.  2. Coronary artery disease: Asymptomatic.  No  changes.  3.  Persistent atrial fibrillation: She is likely been in atrial fibrillation for the last month.  She was recently put on Eliquis.  She would like a trial of sinus rhythm.  We Leeona Mccardle plan for cardioversion.  This patients CHA2DS2-VASc Score and unadjusted Ischemic Stroke Rate (% per year) is equal to 4.8 % stroke rate/year from a score of 4  Above score calculated as 1 point each if present [CHF, HTN, DM, Vascular=MI/PAD/Aortic Plaque, Age if 65-74, or Female] Above score calculated as 2 points each if present [Age > 75, or Stroke/TIA/TE]   Current medicines are reviewed at length with the patient today.   The patient does not have concerns regarding her medicines.  The following changes were made today: none  Labs/ tests ordered today include: none  No orders of the defined types were placed in this encounter.    Disposition:   FU with Ezeriah Luty 3 months  Signed, Amarah Brossman Meredith Leeds, MD  07/17/2017 3:46 PM     [place] [address] [phone] (office) [phone] (fax)

## 2017-07-17 NOTE — Progress Notes (Signed)
Electrophysiology Office Note   Date:  07/17/2017   ID:  Susan [name], DOB Jun 18, 1934, MRN [id number]  PCP:  [name], MD  Cardiologist:  Shelva Majestic Primary Electrophysiologist:  Constance Haw, MD    Chief Complaint  Patient presents with  . Follow-up    Sinus bradycardia/Mobitz/PAF     History of Present Illness: Susan [name] is a 83 y.o. female who presents today for electrophysiology evaluation.   History of coronary artery disease. In July 2014, she presented to [facility] with an anterior wall STEMI and underwent emergent cardiac catheterization where her LAD was found to be totally occluded. She underwent stenting of her LAD eInitially she had significant wall motion abnormality involving the LAD territory. She also was found to have a 95% mid AV groove circumflex stenosis, on 10/23/2012 she underwent successful staged intervention with insertion with a DES stent into the circumflex vessel. She wore a cardiac monitor that showed heart rates as low as 20 but with sinus bradycardia in the 40s-50s and up to 2-3-second pauses.  Her heart rates were in the 60s-80s with activity.  She was sent to the emergency room, but did not get a pacemaker due to lack of symptoms.  Today, denies symptoms of palpitations, chest pain, shortness of breath, orthopnea, PND, lower extremity edema, claudication, dizziness, presyncope, syncope, bleeding, or neurologic sequela. The patient is tolerating medications without difficulties.  He complains of back pain.  She does have some minor fatigue that she feels is due to atrial fibrillation.  She is certainly having significant issues with her back which hampers her ambulation ability.   Past Medical History:  Diagnosis Date  . Bradycardia   . Coronary artery disease    a. s/p STEMI in 2014 with DES to LAD with staged PCI/DES placement to LCx  . Echoencephalogram abnormality   . Heart murmur   . History of Holter  monitoring   . Hyperlipidemia   . Hypertension   . Myocardial infarction ([place])   . NSVT (nonsustained ventricular tachycardia) ([place])   . Personal history of chemotherapy    ENDOMETRIAL CA  . Personal history of radiation therapy    ENDOMETRIAL CA  . Uterine cancer ([place])    serrous   Past Surgical History:  Procedure Laterality Date  . ABDOMINAL HYSTERECTOMY    . BREAST EXCISIONAL BIOPSY Right 1956   NEG  . LEFT HEART CATHETERIZATION WITH CORONARY ANGIOGRAM  10/19/2012   Procedure: LEFT HEART CATHETERIZATION WITH CORONARY ANGIOGRAM;  Surgeon: Troy Sine, MD;  Location: [facility] CATH LAB;  Service: Cardiovascular;;  . PERCUTANEOUS CORONARY STENT INTERVENTION (PCI-S)  10/19/2012   Procedure: PERCUTANEOUS CORONARY STENT INTERVENTION (PCI-S);  Surgeon: Troy Sine, MD;  Location: [facility] CATH LAB;  Service: Cardiovascular;;  . PERCUTANEOUS CORONARY STENT INTERVENTION (PCI-S) N/A 10/23/2012   Procedure: PERCUTANEOUS CORONARY STENT INTERVENTION (PCI-S);  Surgeon: Troy Sine, MD;  Location: [facility] CATH LAB;  Service: Cardiovascular;  Laterality: N/A;  . PERIPHERAL VASCULAR CATHETERIZATION N/A 05/05/2015   Procedure: Glori Luis Cath Insertion;  Surgeon: Algernon Huxley, MD;  Location: [place] CV LAB;  Service: Cardiovascular;  Laterality: N/A;  . PORTA CATH REMOVAL N/A 04/05/2017   Procedure: PORTA CATH REMOVAL;  Surgeon: Algernon Huxley, MD;  Location: [place] CV LAB;  Service: Cardiovascular;  Laterality: N/A;     Current Outpatient Medications  Medication Sig Dispense Refill  . acetaminophen (TYLENOL) 650 MG CR tablet Take 650 mg by mouth every 8 (eight)  hours as needed for pain.    . Alpha-Lipoic Acid 100 MG CAPS Take 1 capsule by mouth at bedtime.     [name] apixaban (ELIQUIS) 5 MG TABS tablet Take 1 tablet (5 mg total) by mouth 2 (two) times daily. 60 tablet 6  . Ascorbic Acid (VITAMIN C) 1000 MG tablet Take 1,000 mg by mouth daily.    [name] aspirin EC 81 MG tablet Take 81 mg by mouth daily.    [name]  atorvastatin (LIPITOR) 80 MG tablet TAKE 1 TABLET (80 MG TOTAL) BY MOUTH DAILY AT 6 PM. 90 tablet 1  . ezetimibe (ZETIA) 10 MG tablet Take 1 tablet (10 mg total) by mouth daily. 30 tablet 0  . fluticasone (FLONASE) 50 MCG/ACT nasal spray Place 2 sprays into both nostrils daily.     . isosorbide mononitrate (IMDUR) 30 MG 24 hr tablet TAKE 1 TABLET (30 MG TOTAL) BY MOUTH DAILY. 90 tablet 1  . loperamide (IMODIUM A-D) 2 MG tablet Take 2 mg by mouth 4 (four) times daily as needed for diarrhea or loose stools.    [name] losartan (COZAAR) 50 MG tablet TAKE 1 TABLET BY MOUTH EVERY DAY (Patient taking differently: TAKE 1 TABLET BY MOUTH EVERY DAY AT NIGHT.) 90 tablet 3  . Multiple Vitamin (MULTIVITAMIN WITH MINERALS) TABS Take 1 tablet by mouth daily.    [name] NITROSTAT 0.4 MG SL tablet PLACE 1 TABLET UNDER THE TOUGUE EVERY 5 MINUTES FOR UP TO 3 DOSES AS NEEDED FOR CHEST PAIN 25 tablet 0  . omeprazole (PRILOSEC) 20 MG capsule Take 20 mg by mouth daily.    . vitamin B-12 (CYANOCOBALAMIN) 1000 MCG tablet Take 1,000 mcg by mouth daily.     No current facility-administered medications for this visit.    Facility-Administered Medications Ordered in Other Visits  Medication Dose Route Frequency Provider Last Rate Last Dose  . sodium chloride flush (NS) 0.9 % injection 10 mL  10 mL Intravenous PRN Cammie Sickle, MD   10 mL at 06/06/16 1447  . sodium chloride flush (NS) 0.9 % injection 10 mL  10 mL Intravenous PRN Cammie Sickle, MD   10 mL at 08/15/16 1404    Allergies:   Ace inhibitors and Penicillins   Social History:  The patient  reports that she quit smoking about 43 years ago. She has never used smokeless tobacco. She reports that she drinks about 0.6 oz of alcohol per week. She reports that she does not use drugs.   Family History:  The patient's family history includes Breast cancer (age of onset: 25) in her mother; Diabetes in her brother and father; Heart disease in her father and mother;  Stroke in her brother.   ROS:  Please see the history of present illness.   Otherwise, review of systems is positive for fatigue.   All other systems are reviewed and negative.   PHYSICAL EXAM: VS:  BP 124/80   Pulse 62   Ht 5\' 4"  (1.626 m)   Wt 205 lb (93 kg)   SpO2 97%   BMI 35.19 kg/m  , BMI Body mass index is 35.19 kg/m. GEN: Well nourished, well developed, in no acute distress  HEENT: normal  Neck: no JVD, carotid bruits, or masses Cardiac: iRRR; no murmurs, rubs, or gallops,no edema  Respiratory:  clear to auscultation bilaterally, normal work of breathing GI: soft, nontender, nondistended, + BS MS: no deformity or atrophy  Skin: warm and dry Neuro:  Strength and sensation are intact  Psych: euthymic mood, full affect  EKG:  EKG is not ordered today. Personal review of the ekg ordered 06/15/17 shows atrial fibrillation, raet 67   Recent Labs: 03/08/2017: BUN 17; Creatinine, Ser 1.04; Hemoglobin 11.7; Platelets 230; Potassium 4.0; Sodium 138 06/06/2017: ALT 13    Lipid Panel     Component Value Date/Time   CHOL 116 06/06/2017 0922   CHOL 141 04/16/2013 0924   TRIG 75 06/06/2017 0922   TRIG 112 04/16/2013 0924   HDL 51 06/06/2017 0922   HDL 53 04/16/2013 0924   CHOLHDL 2.3 06/06/2017 0922   CHOLHDL 2.2 06/19/2016 0950   VLDL 20 06/19/2016 0950   LDLCALC 50 06/06/2017 0922   LDLCALC 66 04/16/2013 0924     Wt Readings from Last 3 Encounters:  07/17/17 205 lb (93 kg)  06/15/17 203 lb 9.6 oz (92.4 kg)  04/24/17 206 lb (93.4 kg)      Other studies Reviewed: Additional studies/ records that were reviewed today include: TTE 01/04/17 Review of the above records today demonstrates:  - Left ventricle: The cavity size was normal. There was moderate   concentric hypertrophy. Systolic function was mildly to   moderately reduced. The estimated ejection fraction was in the   range of 40% to 45%. Severe hypokinesis of the inferior,   inferoseptal, and apical  myocardium. Mild anteroseptal and   inferolateral hypokinesis. Doppler parameters are consistent with   abnormal left ventricular relaxation (grade 1 diastolic   dysfunction). Doppler parameters are consistent with high   ventricular filling pressure. - Aortic valve: There was mild stenosis. Peak velocity (S): 238   cm/s. Mean gradient (S): 12 mm Hg. Valve area (VTI): 2.7 cm^2.   Valve area (Vmax): 2.18 cm^2. Valve area (Vmean): 2.39 cm^2. - Mitral valve: Calcified annulus. Severe thickening and   calcification. Transvalvular velocity was within the normal   range. There was no evidence for stenosis. There was mild   regurgitation. Valve area by continuity equation (using LVOT   flow): 3.08 cm^2. - Left atrium: The atrium was severely dilated. - Right ventricle: The cavity size was normal. Wall thickness was   normal. Systolic function was normal. - Tricuspid valve: There was no regurgitation. - Pulmonary arteries: Systolic pressure was within the normal   range. PA peak pressure: 32 mm Hg (S). - Pericardium, extracardiac: A trivial pericardial effusion was   identified.  Holter 01/17/17 - personally reviewed Markedly abnormal monitor demonstrating predominantly underlying atrial fibrillation with slow ventricular rate.  The average heart beat was 53 bpm.  The maximum heart rate was 109 bpm.  During the monitoring period, the patient had significant bradycardia with heart rates down in the mid to low 30s.  Th there were 2 episodes with heart rates below 30 at 25 bpm  and at 19 bpm immediately thereafter at 9:57 PM on 01/10/2017.  On 01/11/2017 at 7:47 AM heart rate was 24 bpm.   ASSESSMENT AND PLAN:  1.  Sick sinus syndrome: She has not bradycardic.  She has a left bundle branch block and is in atrial fibrillation.  She has been on Eliquis.  As she does not have symptoms, would hold off on pacemaker implantation at this time.  2. Coronary artery disease: Asymptomatic.  No  changes.  3.  Persistent atrial fibrillation: She is likely been in atrial fibrillation for the last month.  She was recently put on Eliquis.  She would like a trial of sinus rhythm.  We Rosaelena Kemnitz plan for cardioversion.  This patients CHA2DS2-VASc Score and unadjusted Ischemic Stroke Rate (% per year) is equal to 4.8 % stroke rate/year from a score of 4  Above score calculated as 1 point each if present [CHF, HTN, DM, Vascular=MI/PAD/Aortic Plaque, Age if 65-74, or Female] Above score calculated as 2 points each if present [Age > 75, or Stroke/TIA/TE]   Current medicines are reviewed at length with the patient today.   The patient does not have concerns regarding her medicines.  The following changes were made today: none  Labs/ tests ordered today include: none  No orders of the defined types were placed in this encounter.    Disposition:   FU with Kenyatta Keidel 3 months  Signed, Osamu Olguin Meredith Leeds, MD  07/17/2017 3:46 PM     [address] [place] [address] [phone] (office) [phone] (fax)

## 2017-07-17 NOTE — Patient Instructions (Addendum)
Medication Instructions:  Your physician recommends that you continue on your current medications as directed. Please refer to the Current Medication list given to you today.  Labwork: None ordered  Testing/Procedures: Your physician has recommended that you have a Cardioversion (DCCV). Electrical Cardioversion uses a jolt of electricity to your heart either through paddles or wired patches attached to your chest. This is a controlled, usually prescheduled, procedure. Defibrillation is done under light anesthesia in the hospital, and you usually go home the day of the procedure. This is done to get your heart back into a normal rhythm. You are not awake for the procedure. Please see the instruction sheet given to you today.  DCCV INSTRUCTIONS Please arrive at the [facility] main entrance of [facility] hospital on   07/27/17 at 7:30 a.m. Do not eat or drink after midnight prior to procedure Take your medications as normal the morning of your procedure with a sip of water. You will need someone to drive you home at discharge   Follow-Up: Your physician recommends that you schedule a follow-up appointment in: 3 months with [name].  * If you need a refill on your cardiac medications before your next appointment, please call your pharmacy.   *Please note that any paperwork needing to be filled out by the provider will need to be addressed at the front desk prior to seeing the provider. Please note that any FMLA, disability or other documents regarding health condition is subject to a $25.00 charge that must be received prior to completion of paperwork in the form of a money order or check.  Thank you for choosing [facility]!!   Trinidad Curet, RN [phone]  Any Other Special Instructions Will Be Listed Below (If Applicable).    Electrical Cardioversion Electrical cardioversion is the delivery of a jolt of electricity to restore a normal rhythm to the heart. A rhythm that is  too fast or is not regular keeps the heart from pumping well. In this procedure, sticky patches or metal paddles are placed on the chest to deliver electricity to the heart from a device. This procedure may be done in an emergency if:  There is low or no blood pressure as a result of the heart rhythm.  Normal rhythm must be restored as fast as possible to protect the brain and heart from further damage.  It may save a life.  This procedure may also be done for irregular or fast heart rhythms that are not immediately life-threatening. Tell a health care provider about:  Any allergies you have.  All medicines you are taking, including vitamins, herbs, eye drops, creams, and over-the-counter medicines.  Any problems you or family members have had with anesthetic medicines.  Any blood disorders you have.  Any surgeries you have had.  Any medical conditions you have.  Whether you are pregnant or may be pregnant. What are the risks? Generally, this is a safe procedure. However, problems may occur, including:  Allergic reactions to medicines.  A blood clot that breaks free and travels to other parts of your body.  The possible return of an abnormal heart rhythm within hours or days after the procedure.  Your heart stopping (cardiac arrest). This is rare.  What happens before the procedure? Medicines  Your health care provider may have you start taking: ? Blood-thinning medicines (anticoagulants) so your blood does not clot as easily. ? Medicines may be given to help stabilize your heart rate and rhythm.  Ask your health  care provider about changing or stopping your regular medicines. This is especially important if you are taking diabetes medicines or blood thinners. General instructions  Plan to have someone take you home from the hospital or clinic.  If you will be going home right after the procedure, plan to have someone with you for 24 hours.  Follow instructions from  your health care provider about eating or drinking restrictions. What happens during the procedure?  To lower your risk of infection: ? Your health care team will wash or sanitize their hands. ? Your skin will be washed with soap.  An IV tube will be inserted into one of your veins.  You will be given a medicine to help you relax (sedative).  Sticky patches (electrodes) or metal paddles may be placed on your chest.  An electrical shock will be delivered. The procedure may vary among health care providers and hospitals. What happens after the procedure?  Your blood pressure, heart rate, breathing rate, and blood oxygen level will be monitored until the medicines you were given have worn off.  Do not drive for 24 hours if you were given a sedative.  Your heart rhythm will be watched to make sure it does not change. This information is not intended to replace advice given to you by your health care provider. Make sure you discuss any questions you have with your health care provider. Document Released: 03/03/2002 Document Revised: 11/10/2015 Document Reviewed: 09/17/2015 Elsevier Interactive Patient Education  2017 [employer].

## 2017-07-27 ENCOUNTER — Ambulatory Visit (HOSPITAL_COMMUNITY): Payer: Medicare Other | Admitting: Certified Registered Nurse Anesthetist

## 2017-07-27 ENCOUNTER — Ambulatory Visit (HOSPITAL_COMMUNITY)
Admission: RE | Admit: 2017-07-27 | Discharge: 2017-07-27 | Disposition: A | Discharge status: Home / Self Care | Payer: Medicare Other | Source: Ambulatory Visit | Attending: Cardiovascular Disease | Admitting: Cardiovascular Disease

## 2017-07-27 ENCOUNTER — Encounter (HOSPITAL_COMMUNITY): Payer: Self-pay | Admitting: *Deleted

## 2017-07-27 ENCOUNTER — Encounter (HOSPITAL_COMMUNITY)
Admission: RE | Disposition: A | Discharge status: Home / Self Care | Payer: Self-pay | Source: Ambulatory Visit | Attending: Cardiovascular Disease

## 2017-07-27 DIAGNOSIS — Z823 Family history of stroke: Secondary | ICD-10-CM | POA: Diagnosis not present

## 2017-07-27 DIAGNOSIS — Z7951 Long term (current) use of inhaled steroids: Secondary | ICD-10-CM | POA: Diagnosis not present

## 2017-07-27 DIAGNOSIS — Z8542 Personal history of malignant neoplasm of other parts of uterus: Secondary | ICD-10-CM | POA: Insufficient documentation

## 2017-07-27 DIAGNOSIS — Z833 Family history of diabetes mellitus: Secondary | ICD-10-CM | POA: Insufficient documentation

## 2017-07-27 DIAGNOSIS — Z87891 Personal history of nicotine dependence: Secondary | ICD-10-CM | POA: Insufficient documentation

## 2017-07-27 DIAGNOSIS — Z923 Personal history of irradiation: Secondary | ICD-10-CM | POA: Insufficient documentation

## 2017-07-27 DIAGNOSIS — Z79899 Other long term (current) drug therapy: Secondary | ICD-10-CM | POA: Diagnosis not present

## 2017-07-27 DIAGNOSIS — Z7982 Long term (current) use of aspirin: Secondary | ICD-10-CM | POA: Insufficient documentation

## 2017-07-27 DIAGNOSIS — Z888 Allergy status to other drugs, medicaments and biological substances status: Secondary | ICD-10-CM | POA: Insufficient documentation

## 2017-07-27 DIAGNOSIS — I440 Atrioventricular block, first degree: Secondary | ICD-10-CM | POA: Insufficient documentation

## 2017-07-27 DIAGNOSIS — Z955 Presence of coronary angioplasty implant and graft: Secondary | ICD-10-CM | POA: Insufficient documentation

## 2017-07-27 DIAGNOSIS — Z9221 Personal history of antineoplastic chemotherapy: Secondary | ICD-10-CM | POA: Diagnosis not present

## 2017-07-27 DIAGNOSIS — Z880 Allergy status to penicillin: Secondary | ICD-10-CM | POA: Insufficient documentation

## 2017-07-27 DIAGNOSIS — I481 Persistent atrial fibrillation: Secondary | ICD-10-CM | POA: Diagnosis not present

## 2017-07-27 DIAGNOSIS — Z7901 Long term (current) use of anticoagulants: Secondary | ICD-10-CM | POA: Diagnosis not present

## 2017-07-27 DIAGNOSIS — I495 Sick sinus syndrome: Secondary | ICD-10-CM | POA: Diagnosis not present

## 2017-07-27 DIAGNOSIS — Z90710 Acquired absence of both cervix and uterus: Secondary | ICD-10-CM | POA: Diagnosis not present

## 2017-07-27 DIAGNOSIS — I4819 Other persistent atrial fibrillation: Secondary | ICD-10-CM

## 2017-07-27 DIAGNOSIS — I252 Old myocardial infarction: Secondary | ICD-10-CM | POA: Insufficient documentation

## 2017-07-27 DIAGNOSIS — Z803 Family history of malignant neoplasm of breast: Secondary | ICD-10-CM | POA: Diagnosis not present

## 2017-07-27 DIAGNOSIS — I10 Essential (primary) hypertension: Secondary | ICD-10-CM | POA: Insufficient documentation

## 2017-07-27 DIAGNOSIS — E785 Hyperlipidemia, unspecified: Secondary | ICD-10-CM | POA: Insufficient documentation

## 2017-07-27 DIAGNOSIS — I2510 Atherosclerotic heart disease of native coronary artery without angina pectoris: Secondary | ICD-10-CM | POA: Insufficient documentation

## 2017-07-27 DIAGNOSIS — Z8249 Family history of ischemic heart disease and other diseases of the circulatory system: Secondary | ICD-10-CM | POA: Diagnosis not present

## 2017-07-27 DIAGNOSIS — I4891 Unspecified atrial fibrillation: Secondary | ICD-10-CM | POA: Diagnosis present

## 2017-07-27 DIAGNOSIS — Z9889 Other specified postprocedural states: Secondary | ICD-10-CM | POA: Diagnosis not present

## 2017-07-27 HISTORY — PX: CARDIOVERSION: SHX1299

## 2017-07-27 LAB — POCT I-STAT 4, (NA,K, GLUC, HGB,HCT)
Glucose, Bld: 96 mg/dL (ref 65–99)
HCT: 37 % (ref 36.0–46.0)
Hemoglobin: 12.6 g/dL (ref 12.0–15.0)
POTASSIUM: 3.8 mmol/L (ref 3.5–5.1)
SODIUM: 142 mmol/L (ref 135–145)

## 2017-07-27 SURGERY — CARDIOVERSION
Anesthesia: General

## 2017-07-27 MED ORDER — LIDOCAINE 2% (20 MG/ML) 5 ML SYRINGE
INTRAMUSCULAR | Status: DC | PRN
  Administered 2017-07-27: 60 mg via INTRAVENOUS

## 2017-07-27 MED ORDER — PROPOFOL 10 MG/ML IV BOLUS
INTRAVENOUS | Status: DC | PRN
  Administered 2017-07-27: 50 mg via INTRAVENOUS

## 2017-07-27 MED ORDER — SODIUM CHLORIDE 0.9 % IV SOLN
INTRAVENOUS | Status: DC
  Administered 2017-07-27: 08:00:00 via INTRAVENOUS

## 2017-07-27 NOTE — Anesthesia Preprocedure Evaluation (Signed)
Anesthesia Evaluation  Patient identified by MRN, date of birth, ID band Patient awake    Reviewed: Allergy & Precautions, NPO status , Patient's Chart, lab work & pertinent test results  Airway Mallampati: II  TM Distance: >3 FB Neck ROM: Full    Dental no notable dental hx.    Pulmonary neg pulmonary ROS, former smoker,    Pulmonary exam normal breath sounds clear to auscultation       Cardiovascular hypertension, + CAD, + Past MI and + Cardiac Stents  + dysrhythmias Atrial Fibrillation  Rhythm:Irregular Rate:Normal  Procedure narrative: Transthoracic echocardiography. Image   quality was adequate. Intravenous contrast (Definity) was   administered. - Left ventricle: Septal and apical akinesis No mural apical   thrombus using definity. The cavity size was moderately dilated.   Wall thickness was increased in a pattern of moderate LVH.   Systolic function was mildly to moderately reduced. The estimated   ejection fraction was in the range of 40% to 45%. The study is   not technically sufficient to allow evaluation of LV diastolic   function. - Aortic valve: There was mild stenosis. Valve area (VTI): 2.1   cm^2. Valve area (Vmax): 1.61 cm^2. Valve area (Vmean): 1.82   cm^2. - Left atrium: The atrium was moderately dilated. - Atrial septum: No defect or patent foramen ovale was identified. - Pulmonary arteries: PA peak pressure: 36 mm Hg   Neuro/Psych negative neurological ROS  negative psych ROS   GI/Hepatic negative GI ROS, Neg liver ROS,   Endo/Other  negative endocrine ROS  Renal/GU negative Renal ROS  negative genitourinary   Musculoskeletal negative musculoskeletal ROS (+)   Abdominal   Peds negative pediatric ROS (+)  Hematology negative hematology ROS (+)   Anesthesia Other Findings   Reproductive/Obstetrics negative OB ROS                             Anesthesia  Physical Anesthesia Plan  ASA: III  Anesthesia Plan: General   Post-op Pain Management:    Induction: Intravenous  PONV Risk Score and Plan: 0 and Treatment may vary due to age or medical condition  Airway Management Planned: Mask  Additional Equipment:   Intra-op Plan:   Post-operative Plan:   Informed Consent: I have reviewed the patients History and Physical, chart, labs and discussed the procedure including the risks, benefits and alternatives for the proposed anesthesia with the patient or authorized representative who has indicated his/her understanding and acceptance.   Dental advisory given  Plan Discussed with: CRNA and Surgeon  Anesthesia Plan Comments:         Anesthesia Quick Evaluation

## 2017-07-27 NOTE — Transfer of Care (Signed)
Immediate Anesthesia Transfer of Care Note  Patient: Susan [name]  Procedure(s) Performed: CARDIOVERSION (N/A )  Patient Location: Endoscopy Unit  Anesthesia Type:General  Level of Consciousness: awake, alert  and oriented  Airway & Oxygen Therapy: Patient Spontanous Breathing  Post-op Assessment: Report given to RN and Post -op Vital signs reviewed and stable  Post vital signs: Reviewed and stable  Last Vitals:  Vitals Value Taken Time  BP    Temp    Pulse 55 07/27/2017  9:28 AM  Resp 18 07/27/2017  9:28 AM  SpO2 92 % 07/27/2017  9:28 AM  Vitals shown include unvalidated device data.  Last Pain:  Vitals:   07/27/17 0743  TempSrc: Oral  PainSc: 0-No pain         Complications: No apparent anesthesia complications

## 2017-07-27 NOTE — Interval H&P Note (Signed)
History and Physical Interval Note:  07/27/2017 8:57 AM  Susan [name]  has presented today for surgery, with the diagnosis of A-FIB  The various methods of treatment have been discussed with the patient and family. After consideration of risks, benefits and other options for treatment, the patient has consented to  Procedure(s): CARDIOVERSION (N/A) as a surgical intervention .  The patient's history has been reviewed, patient examined, no change in status, stable for surgery.  I have reviewed the patient's chart and labs.  Questions were answered to the patient's satisfaction.     Skeet Latch, MD

## 2017-07-27 NOTE — CV Procedure (Signed)
Electrical Cardioversion Procedure Note ZULEIKA GALLUS [id number] 1934-11-07  Procedure: Electrical Cardioversion Indications:  Atrial Fibrillation  Procedure Details Consent: Risks of procedure as well as the alternatives and risks of each were explained to the (patient/caregiver).  Consent for procedure obtained. Time Out: Verified patient identification, verified procedure, site/side was marked, verified correct patient position, special equipment/implants available, medications/allergies/relevent history reviewed, required imaging and test results available.  Performed  Patient placed on cardiac monitor, pulse oximetry, supplemental oxygen as necessary.  Sedation given: propofol Pacer pads placed anterior and posterior chest.  Cardioverted 1 time(s).  Cardioverted at 150J.  Evaluation Findings: Post procedure EKG shows: sinus bradycardia with first degree AV block. Complications: None Patient did tolerate procedure well.   Skeet Latch, MD 07/27/2017, 9:25 AM

## 2017-07-27 NOTE — Interval H&P Note (Signed)
History and Physical Interval Note:  07/27/2017 8:57 AM  Susan [name]  has presented today for surgery, with the diagnosis of A-FIB  The various methods of treatment have been discussed with the patient and family. After consideration of risks, benefits and other options for treatment, the patient has consented to  Procedure(s): CARDIOVERSION (N/A) as a surgical intervention .  The patient's history has been reviewed, patient examined, no change in status, stable for surgery.  I have reviewed the patient's chart and labs.  Questions were answered to the patient's satisfaction.     Skeet Latch

## 2017-07-27 NOTE — Anesthesia Procedure Notes (Signed)
Procedure Name: General with mask airway Performed by: Valda Favia, CRNA Pre-anesthesia Checklist: Patient identified, Emergency Drugs available, Suction available, Patient being monitored and Timeout performed Patient Re-evaluated:Patient Re-evaluated prior to induction Oxygen Delivery Method: Ambu bag Preoxygenation: Pre-oxygenation with 100% oxygen Induction Type: IV induction Placement Confirmation: positive ETCO2 Dental Injury: Teeth and Oropharynx as per pre-operative assessment

## 2017-07-27 NOTE — Discharge Instructions (Signed)
Electrical Cardioversion, Care After °This sheet gives you information about how to care for yourself after your procedure. Your health care provider may also give you more specific instructions. If you have problems or questions, contact your health care provider. °What can I expect after the procedure? °After the procedure, it is common to have: °· Some redness on the skin where the shocks were given. ° °Follow these instructions at home: °· Do not drive for 24 hours if you were given a medicine to help you relax (sedative). °· Take over-the-counter and prescription medicines only as told by your health care provider. °· Ask your health care provider how to check your pulse. Check it often. °· Rest for 48 hours after the procedure or as told by your health care provider. °· Avoid or limit your caffeine use as told by your health care provider. °Contact a health care provider if: °· You feel like your heart is beating too quickly or your pulse is not regular. °· You have a serious muscle cramp that does not go away. °Get help right away if: °· You have discomfort in your chest. °· You are dizzy or you feel faint. °· You have trouble breathing or you are short of breath. °· Your speech is slurred. °· You have trouble moving an arm or leg on one side of your body. °· Your fingers or toes turn cold or blue. °This information is not intended to replace advice given to you by your health care provider. Make sure you discuss any questions you have with your health care provider. °Document Released: 01/01/2013 Document Revised: 10/15/2015 Document Reviewed: 09/17/2015 °Elsevier Interactive Patient Education © 2018 [employer] ° °

## 2017-07-27 NOTE — Anesthesia Postprocedure Evaluation (Signed)
Anesthesia Post Note  Patient: Susan [name]  Procedure(s) Performed: CARDIOVERSION (N/A )     Patient location during evaluation: PACU Anesthesia Type: General Level of consciousness: awake and alert Pain management: pain level controlled Vital Signs Assessment: post-procedure vital signs reviewed and stable Respiratory status: spontaneous breathing, nonlabored ventilation, respiratory function stable and patient connected to nasal cannula oxygen Cardiovascular status: blood pressure returned to baseline and stable Postop Assessment: no apparent nausea or vomiting Anesthetic complications: no    Last Vitals:  Vitals:   07/27/17 0928 07/27/17 0938  BP: 132/66 120/76  Pulse: (!) 54 (!) 52  Resp: 20 12  Temp: 36.8 C   SpO2: 95% 97%    Last Pain:  Vitals:   07/27/17 0938  TempSrc:   PainSc: 0-No pain                 [name]

## 2017-07-29 ENCOUNTER — Encounter (HOSPITAL_COMMUNITY): Payer: Self-pay | Admitting: Cardiovascular Disease

## 2017-08-13 ENCOUNTER — Ambulatory Visit: Payer: Medicare Other | Admitting: Urology

## 2017-08-13 ENCOUNTER — Encounter: Payer: Self-pay | Admitting: Urology

## 2017-08-13 VITALS — BP 100/64 | HR 57 | Resp 16 | Ht 64.0 in | Wt 208.5 lb

## 2017-08-13 DIAGNOSIS — N3946 Mixed incontinence: Secondary | ICD-10-CM

## 2017-08-13 DIAGNOSIS — N390 Urinary tract infection, site not specified: Secondary | ICD-10-CM | POA: Diagnosis not present

## 2017-08-13 LAB — URINALYSIS, COMPLETE
BILIRUBIN UA: NEGATIVE
GLUCOSE, UA: NEGATIVE
KETONES UA: NEGATIVE
LEUKOCYTES UA: NEGATIVE
Nitrite, UA: NEGATIVE
PROTEIN UA: NEGATIVE
SPEC GRAV UA: 1.02 (ref 1.005–1.030)
Urobilinogen, Ur: 0.2 mg/dL (ref 0.2–1.0)
pH, UA: 5 (ref 5.0–7.5)

## 2017-08-13 LAB — MICROSCOPIC EXAMINATION
Epithelial Cells (non renal): NONE SEEN /hpf (ref 0–10)
WBC, UA: NONE SEEN /hpf (ref 0–5)
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
## 2017-08-22 ENCOUNTER — Encounter: Payer: Self-pay | Admitting: Obstetrics and Gynecology

## 2017-08-22 ENCOUNTER — Inpatient Hospital Stay: Payer: Medicare Other | Attending: Obstetrics and Gynecology | Admitting: Obstetrics and Gynecology

## 2017-08-22 VITALS — BP 142/81 | HR 70 | Temp 98.1°F | Resp 20 | Ht 64.0 in | Wt 208.5 lb

## 2017-08-22 DIAGNOSIS — Z90710 Acquired absence of both cervix and uterus: Secondary | ICD-10-CM

## 2017-08-22 DIAGNOSIS — Z87891 Personal history of nicotine dependence: Secondary | ICD-10-CM | POA: Diagnosis not present

## 2017-08-22 DIAGNOSIS — C541 Malignant neoplasm of endometrium: Secondary | ICD-10-CM | POA: Insufficient documentation

## 2017-08-22 DIAGNOSIS — Z923 Personal history of irradiation: Secondary | ICD-10-CM

## 2017-08-22 DIAGNOSIS — Z9221 Personal history of antineoplastic chemotherapy: Secondary | ICD-10-CM | POA: Diagnosis not present

## 2017-08-22 DIAGNOSIS — Z90722 Acquired absence of ovaries, bilateral: Secondary | ICD-10-CM | POA: Diagnosis not present

## 2017-08-22 NOTE — Progress Notes (Signed)
Gynecologic Oncology Interval Visit   Referring Provider: Dr Joylene Igo  Chief Concern: stage IIIC1serous endometrial cancer  Subjective:  Susan [name] is a 83 y.o. P70 female who was initially seen in consultation from Dr. Newman Nip for serous uterine cancer.   She was last seen in clinic on 02/21/2017 by Dr. Theora Gianotti with negative exam.   In the interim, she has seen Dr. Mike Gip for routine follow-up and had a negative exam. She has had her port removed. She continues to be followed by cardiology for bradycardia. She declined a pacemaker at that time for asymptomatic bradycardia. She developed atrial fibrillation and underwent electrical cardioversion on 07/27/17. She has known lung nodules which showed:   03/06/2017 CT Chest WO Contrast: IMPRESSION: Stable tiny sub-cm right lung nodules, probably benign. Recommend continued followup by chest CT in 6 months.  She had a prior history of urinary incontinence and was seen by Dr. Matilde Sprang, Urology on 08/13/17. They discussed percutaneous tibial nerve stimulation.   Today, she reports chronic fatigue, problems with bladder function, back pain, leg swelling, neuropathy, and pelvic pain. She has repeat CT Chest to follow up on lung nodules scheduled for 09/04/2017. She has a follow-up with Dr. Mike Gip scheduled for 09/13/17.   CA 125 16.1 (03/08/17)     Gynecologic Oncology  Susan [name] is a 83 y.o. 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  Please see complete details in prior notes.   Path report below shows stage IIIC1 disease.  Washings were negative, but she had 3 positive SLNs in pelvis.  An aortic SLN was negative.     A. Lymph node, sentinel, right external iliac, excision: Minute focus of metastatic carcinoma involving one lymph node (1/1). See note. Size of metastasis: Less than 0.1 mm. Negative for  extranodal invasion.  Note: A cytokeratin AE1/3 highlights the metastatic focus.  B. Lymph node, sentinel, right external iliac #2, excision: Metastatic carcinoma involving one lymph node (1/1). See note. Size of metastasis: 0.5 mm. Negative for extranodal invasion. Note: A cytokeratin AE1/3 highlights the metastatic focus.  C. Lymph node, sentinel, right aortic, excision: One benign lymph node (0/1). Note: A cytokeratin AE1/3 immunostain is negative.  D. Lymph node, sentinel, left external iliac, excision:  Metastatic adenocarcinoma involving one lymph node (1/1).   Size of metastasis: 1 cm. Negative for extranodal invasion. Note: A cytokeratin AE1/3 highlights the metastatic focus.  E. 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  Tumor size: 5.1 cm. The tumor invades 2 mm into a 14 mm thick myometrium. Negative for cervical involvement. Lymphovascular invasion is present.  04/28/2015 CA125 82.5   Recommendation to proceed with with carboplatinum and paclitaxel three-four cycles followed by radiation therapy followed by additional 2-3 cycles of chemotherapy. She started on chemotherapy on 05/12/2015 and received 2nd cycle on 06/02/2015. She developed significant neuropathy impairing her quality of life with complaints of sensory ataxia, frequent falls, and needs to walk with the help of walker. Chemotherapy was discontinued. Then received 4500 cGy pelvic radiation and 1200 cGy using high dose rate remote afterloading through vaginal cylinder completed 7/17.  08/31/2016 Abdominal and pelvic CT scan revealed no evidence of recurrent disease.  There was a new sacral insufficiency fracture.  She was referred to surgery.  Scans revealed a 3 mm pulmonary nodule.  Repeat chest CT in 3 months  was recommended.  Pelvic MRI on 10/23/2016 revealed bilateral  sacral insufficiency fractures with possible insufficiency fractures of the left greater than right bilateral iliac wings.  Lumbar spine MRI revealed diffuse degenerative disease.  Decision was made not to perform a sacral plasty.  She was seen by Dr. Deetta Perla on 11/07/2016.  Surgical decompression or fusion was not recommended. Recommendation was made to the pain clinic for treatment (medication, injections, or spinal cord stimulator). She is currently seeing a chiropractor and has had 6 treatments.  Chest CT on 12/05/2016 revealed scattered right lung nodules measuring up to 6 mm (mean diameter) in the right upper lobe along the minor fissure.  Metastatic disease was not excluded.  Recommendation was for follow-up CT in 3-6 months.   CA125 values 04/28/2015 82.5  06/02/2015 22.0 07/14/2015 18.2 10/21/2015 11.7 01/21/2016 16.4 04/25/2016 14.2 08/15/2016 13.6 12/07/2016  16.2 03/08/2017 16.1   Problem List: Patient Active Problem List   Diagnosis Date Noted  . Persistent atrial fibrillation ([place])   . Port-A-Cath in place 03/28/2017  . Bradycardia 01/18/2017  . Obesity (BMI 30-39.9) 01/17/2017  . Pulmonary nodules 12/05/2016  . Sacral insufficiency fracture 09/05/2016  . Absence of bladder continence 07/21/2015  . Neuropathy associated with cancer ([place]) 06/02/2015  . Carcinoma of endometrium ([place]) 04/02/2015  . Gastro-esophageal reflux disease without esophagitis 02/15/2015  . Osteopenia 08/17/2014  . Kidney stones 10/27/2013  . CA skin, basal cell 06/30/2013  . Lumbar canal stenosis 06/30/2013  . Arthritis, degenerative 06/30/2013  . Neuralgia neuritis, sciatic nerve 06/30/2013  . Coronary artery disease 10/25/2012  . Dyslipidemia 10/25/2012  . NSVT (nonsustained ventricular tachycardia) ([place]) 10/25/2012  . Sick sinus syndrome ([place]) 10/25/2012  . Ischemic cardiomyopathy 10/22/2012  . Old anterior wall myocardial infarction 10/20/2012  . Hypertensive heart disease without CHF  10/20/2012    Past Medical History: Past Medical History:  Diagnosis Date  . Bradycardia   . Coronary artery disease    a. s/p STEMI in 2014 with DES to LAD with staged PCI/DES placement to LCx  . Echoencephalogram abnormality   . Heart murmur   . History of Holter monitoring   . Hyperlipidemia   . Hypertension   . Myocardial infarction ([place])   . NSVT (nonsustained ventricular tachycardia) ([place])   . Personal history of chemotherapy    ENDOMETRIAL CA  . Personal history of radiation therapy    ENDOMETRIAL CA  . Uterine cancer ([place])    serrous    Past Surgical History: Past Surgical History:  Procedure Laterality Date  . ABDOMINAL HYSTERECTOMY    . BREAST EXCISIONAL BIOPSY Right 1956   NEG  . CARDIOVERSION N/A 07/27/2017   Procedure: CARDIOVERSION;  Surgeon: Skeet Latch, MD;  Location: [place];  Service: Cardiovascular;  Laterality: N/A;  . LEFT HEART CATHETERIZATION WITH CORONARY ANGIOGRAM  10/19/2012   Procedure: LEFT HEART CATHETERIZATION WITH CORONARY ANGIOGRAM;  Surgeon: [place] Sine, MD;  Location: [facility] CATH LAB;  Service: Cardiovascular;;  . PERCUTANEOUS CORONARY STENT INTERVENTION (PCI-S)  10/19/2012   Procedure: PERCUTANEOUS CORONARY STENT INTERVENTION (PCI-S);  Surgeon: [place] Sine, MD;  Location[facility] CATH LAB;  Service: Cardiovascular;;  . PERCUTANEOUS CORONARY STENT INTERVENTION (PCI-S) N/A 10/23/2012   Procedure: PERCUTANEOUS CORONARY STENT INTERVENTION (PCI-S);  Surgeon: [place] Sine, MD;  Location: [facility] CATH LAB;  Service: Cardiovascular;  Laterality: N/A;  . PERIPHERAL VASCULAR CATHETERIZATION N/A 05/05/2015   Procedure: Glori Luis Cath Insertion;  Surgeon: Algernon Huxley, MD;  Location: [place] CV LAB;  Service: Cardiovascular;  Laterality: N/A;  . PORTA CATH REMOVAL N/A 04/05/2017   Procedure: PORTA CATH REMOVAL;  Surgeon: Algernon Huxley, MD;  Location: [place] CV LAB;  Service: Cardiovascular;  Laterality: N/A;    Family History: Family History   Problem Relation Age of Onset  . Heart disease Mother   . Breast cancer Mother 45  . Heart disease Father   . Diabetes Father   . Diabetes Brother   . Stroke Brother   . Kidney cancer Neg Hx   . Bladder Cancer Neg Hx     Social History: Social History   Socioeconomic History  . Marital status: Married    Spouse name: Not on file  . Number of children: Not on file  . Years of education: Not on file  . Highest education level: Not on file  Occupational History  . Not on file  Social Needs  . Financial resource strain: Not on file  . Food insecurity:    Worry: Not on file    Inability: Not on file  . Transportation needs:    Medical: Not on file    Non-medical: Not on file  Tobacco Use  . Smoking status: Former Smoker    Last attempt to quit: 1976    Years since quitting: 43.4  . Smokeless tobacco: Never Used  Substance and Sexual Activity  . Alcohol use: Yes    Alcohol/week: 0.6 oz    Types: 1 Glasses of wine per week  . Drug use: No  . Sexual activity: Yes  Lifestyle  . Physical activity:    Days per week: Not on file    Minutes per session: Not on file  . Stress: Not on file  Relationships  . Social connections:    Talks on phone: Not on file    Gets together: Not on file    Attends religious service: Not on file    Active member of club or organization: Not on file    Attends meetings of clubs or organizations: Not on file    Relationship status: Not on file  . Intimate partner violence:    Fear of current or ex partner: Not on file    Emotionally abused: Not on file    Physically abused: Not on file    Forced sexual activity: Not on file  Other Topics Concern  . Not on file  Social History Narrative  . Not on file    Allergies: Allergies  Allergen Reactions  . Ace Inhibitors Cough  . Penicillins Rash and Other (See Comments)    Has patient had a PCN reaction causing immediate rash, facial/tongue/throat swelling, SOB or lightheadedness with  hypotension: No Has patient had a PCN reaction causing severe rash involving mucus membranes or skin necrosis: No Has patient had a PCN reaction that required hospitalization: No Has patient had a PCN reaction occurring within the last 10 years: No If all of the above answers are "NO", then may proceed with Cephalosporin use.     Current Medications: Current Outpatient Medications  Medication Sig Dispense Refill  . acetaminophen (TYLENOL) 650 MG CR tablet Take 650 mg by mouth every 8 (eight) hours as needed for pain.    . Alpha-Lipoic Acid 100 MG CAPS Take 100 mg by mouth at bedtime.     [name] apixaban (ELIQUIS) 5 MG TABS tablet Take 1 tablet (5 mg total) by mouth 2 (two) times daily. 60 tablet 6  . aspirin EC 81 MG tablet Take 81 mg by  mouth daily.    [name] atorvastatin (LIPITOR) 80 MG tablet TAKE 1 TABLET (80 MG TOTAL) BY MOUTH DAILY AT 6 PM. (Patient taking differently: Take 80 mg by mouth at bedtime. ) 90 tablet 1  . ezetimibe (ZETIA) 10 MG tablet Take 1 tablet (10 mg total) by mouth daily. 30 tablet 0  . fluticasone (FLONASE) 50 MCG/ACT nasal spray Place 2 sprays into both nostrils daily.     . isosorbide mononitrate (IMDUR) 30 MG 24 hr tablet TAKE 1 TABLET (30 MG TOTAL) BY MOUTH DAILY. 90 tablet 1  . loperamide (IMODIUM A-D) 2 MG tablet Take 2 mg by mouth 4 (four) times daily as needed for diarrhea or loose stools.    [name] losartan (COZAAR) 50 MG tablet TAKE 1 TABLET BY MOUTH EVERY DAY (Patient taking differently: TAKE 1 TABLET BY MOUTH EVERY DAY AT NIGHT.) 90 tablet 3  . Multiple Vitamin (MULTIVITAMIN WITH MINERALS) TABS Take 1 tablet by mouth daily.    [name] NITROSTAT 0.4 MG SL tablet PLACE 1 TABLET UNDER THE TOUGUE EVERY 5 MINUTES FOR UP TO 3 DOSES AS NEEDED FOR CHEST PAIN 25 tablet 0  . omeprazole (PRILOSEC) 20 MG capsule Take 20 mg by mouth daily.    . vitamin B-12 (CYANOCOBALAMIN) 1000 MCG tablet Take 1,000 mcg by mouth daily.    . Ascorbic Acid (VITAMIN C) 1000 MG tablet Take 1,000 mg by mouth  daily.    . cetirizine (ZYRTEC) 10 MG tablet Take 10 mg by mouth daily.     No current facility-administered medications for this visit.    Facility-Administered Medications Ordered in Other Visits  Medication Dose Route Frequency Provider Last Rate Last Dose  . sodium chloride flush (NS) 0.9 % injection 10 mL  10 mL Intravenous PRN Cammie Sickle, MD   10 mL at 06/06/16 1447  . sodium chloride flush (NS) 0.9 % injection 10 mL  10 mL Intravenous PRN Cammie Sickle, MD   10 mL at 08/15/16 1404   Review of Systems General:  fatigue Skin: no complaints Eyes: no complaints HEENT: no complaints Breasts: no complaints Pulmonary: DOE. No productive cough Cardiac: no complaints Gastrointestinal: no complaints Genitourinary/Sexual: no complaints Ob/Gyn: no complaints Musculoskeletal: back pain and leg swelling unchanged and chronic Hematology: no complaints Neurologic/Psych: peripheral neuropathy   Objective:  Physical Examination:  BP (!) 142/81 (BP Location: Left Arm, Patient Position: Sitting)   Pulse 70   Temp 98.1 F (36.7 C) (Oral)   Resp 20   Ht 5\' 4"  (1.626 m)   Wt 208 lb 8 oz (94.6 kg)   BMI 35.79 kg/m    ECOG Performance Status: 0 - Asymptomatic  GENERAL: Patient is a well appearing female in no acute distress HEENT:  Sclerae anicteric.  Oropharynx clear and moist. No ulcerations or evidence of oropharyngeal candidiasis. Neck is supple.  NODES:  No cervical, supraclavicular, or axillary lymphadenopathy palpated.  LUNGS:  Clear to auscultation bilaterally.  No wheezes or rhonchi. HEART:  Regular rate and rhythm. No murmur appreciated. ABDOMEN:  Soft, nontender.  Positive, normoactive bowel sounds. No organomegaly palpated. MSK:  No focal spinal tenderness to palpation. Full range of motion bilaterally in the upper extremities. Ambulates with Cane EXTREMITIES:  No peripheral edema.   SKIN:  Clear with no obvious rashes or skin changes. No nail  dyscrasia. NEURO:  Nonfocal. Well oriented.  Appropriate affect. BREAST: Breasts appear normal, no suspicious masses, no skin or nipple changes or axillary nodes  Pelvic: Exam Chaperoned by  RN. Vulva: normal appearing vulva with no masses, tenderness or lesions; Vagina: normal vagina; no lesions, vaginal shortening; BME: no masses; Uterus/Cervix:surgically absent; Rectal: confirmatory   Assessment:  Susan [name] is a 83 y.o. 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 NED today on exam and recent CA125 normal.   Body mass index is 35.79 kg/m.  Urinary incontinence.  Peripheral neuropathy due to chemotherapy.   Ischemic heart disease with cornonary stents, bradycardia requiring recent admission.   Plan:  She will continue follow up with Dr. Mike Gip at the [facility]. Will plan to get CA 125 at next visit. Dr. Mike Gip has ordered a CT scan to follow lung nodules scheduled for June 2019.   She will continue to follow up with Urology, Cardiology, and her other care providers regarding her medical co morbidities.   I previously reviewed surveillance recommendations and recommended continued close follow up with exams, including pelvic exams every 3-6 months for 2-3 years, then every 6-12 months for 3-5 years and then annually thereafter as long as she is NED.  Imaging and laboratory assessment is based on clinical indication. Patient education for obesity, lifestyle, exercise, nutrition, sexual health, vaginal lubricants was addressed. We previously discussed her weight and need for weight loss, stressed good nutrition, and exercise; provided information regarding calorie counting and exercise for weight loss;  and offered referral to the CARE program and provided a pamphlet previously.    Beckey Rutter, DNP, AGNP-C [place] at [facility] [phone] (work cell) [phone] (office) 08/22/17 4:49 PM  Cc: Dr. Mike Gip  Medical screening examination/treatment/procedure(s) were performed by non-physician practitioner and as supervising physician I was immediately available for consultation/collaboration. Santiago Glad, MD

## 2017-08-22 NOTE — Progress Notes (Signed)
Pt here for endometrial cancer. She has chronic back pain for years and she relates it to a tooth ache that has been steady since July 2017. She saw [facility] and did not rec: surgery and she was sent to pain management as well as she went to chiropractor without relief and does not want opioid drugs. She has urinary issues due to neuropathy and her urologist says that her flap is not working properly and she will start weekly stimulator to try to activate her nerves.

## 2017-08-28 ENCOUNTER — Telehealth: Payer: Self-pay | Admitting: Urology

## 2017-08-28 NOTE — Telephone Encounter (Signed)
PTNS - 12 visits  Per [facility], prior authorization is NOT required (reference # [id number]).    Okay to schedule visits, starting in July.

## 2017-09-03 ENCOUNTER — Encounter: Payer: Self-pay | Admitting: Cardiovascular Disease

## 2017-09-03 ENCOUNTER — Ambulatory Visit: Payer: Medicare Other | Admitting: Cardiovascular Disease

## 2017-09-03 VITALS — BP 124/66 | HR 53 | Ht 64.0 in | Wt 208.0 lb

## 2017-09-03 DIAGNOSIS — Z79899 Other long term (current) drug therapy: Secondary | ICD-10-CM

## 2017-09-03 DIAGNOSIS — I350 Nonrheumatic aortic (valve) stenosis: Secondary | ICD-10-CM | POA: Diagnosis not present

## 2017-09-03 DIAGNOSIS — G4733 Obstructive sleep apnea (adult) (pediatric): Secondary | ICD-10-CM | POA: Diagnosis not present

## 2017-09-03 DIAGNOSIS — R32 Unspecified urinary incontinence: Secondary | ICD-10-CM | POA: Diagnosis not present

## 2017-09-03 DIAGNOSIS — I4819 Other persistent atrial fibrillation: Secondary | ICD-10-CM

## 2017-09-03 DIAGNOSIS — I481 Persistent atrial fibrillation: Secondary | ICD-10-CM | POA: Diagnosis not present

## 2017-09-03 DIAGNOSIS — I252 Old myocardial infarction: Secondary | ICD-10-CM | POA: Diagnosis not present

## 2017-09-03 DIAGNOSIS — I2510 Atherosclerotic heart disease of native coronary artery without angina pectoris: Secondary | ICD-10-CM

## 2017-09-03 DIAGNOSIS — I447 Left bundle-branch block, unspecified: Secondary | ICD-10-CM | POA: Diagnosis not present

## 2017-09-03 DIAGNOSIS — Z7901 Long term (current) use of anticoagulants: Secondary | ICD-10-CM

## 2017-09-03 DIAGNOSIS — M25471 Effusion, right ankle: Secondary | ICD-10-CM

## 2017-09-03 DIAGNOSIS — M25472 Effusion, left ankle: Secondary | ICD-10-CM | POA: Diagnosis not present

## 2017-09-03 MED ORDER — FUROSEMIDE 20 MG PO TABS: 20.0000 mg | ORAL_TABLET | Freq: Every day | ORAL | 3 refills | Status: DC

## 2017-09-03 NOTE — Patient Instructions (Addendum)
Medication Instructions:  Start furosemide (Lasix) 20 mg daily  Labs: Please return for labs (BMET, CBC, Mag, TSH)  Our in office lab hours are Monday-Friday 8:00-4:00, closed for lunch 12:45-1:45 pm.  No appointment needed.  Follow-Up: [name] in 3-4 weeks  Your physician wants you to follow-up in: 4 months with Dr. Claiborne Billings.  You will receive a reminder letter in the mail two months in advance. If you don't receive a letter, please call our office to schedule the follow-up appointment.   Any Other Special Instructions Will Be Listed Below (If Applicable).     If you need a refill on your cardiac medications before your next appointment, please call your pharmacy.

## 2017-09-03 NOTE — Progress Notes (Signed)
Patient ID: Susan [name], female   DOB: 12/17/1934, 83 y.o.   MRN: [id number]      HPI: MADYSEN FAIRCLOTH is a 83 y.o. female who presents to the office today for a 3 month follow-up cardiology evaluation.  Ms. Heitman is originally from [place], [place] and now resides in [place]. On 10/19/2012 she presented [facility] in transfer from [facility] with an anterior wall ST segment elevation myocardial infarction. She had chest pain of greater than 12 hours duration. I performed emergent cardiac catheterization  where her LAD was found to be totally occluded.  She underwent stenting of her LAD extending from the ostium to mid region with 3.0x38 mm and 3.0x16 mm Promus Premier DES stents. Initially she had significant wall motion abnormality involving the LAD territory. She also was found to have a 95% mid AV groove circumflex stenosis.  On 10/23/2012 she underwent successful staged intervention with insertion of a 2.25x12 mm Promus premier DES stent into the circumflex vessel. Subsequently she had done well. She did develop some hypotension leading to reduction in some of her medications.  She was enrolled in the [place] cardiac rehabilitation program. An exercise Myoview study on 12/03/2012 demonstrated significant salvage of myocardium with only a small area of distal antero-apical, apical, septal and  apical scar without evidence for ischemia. Post-rest ejection fraction was 66% and there was evidence for apical akinesis. Laboratory revealed an increased LDL particle number at 1352 despite being on atorvastatin 80 mg. She was contacted and Zetia 10 mg was just added to this atorvastatin dose. She did have 735 small LDL particles and a calculated LDL of 90 with HDL 40 and triglycerides 119.  She had developed hematuria and was found to have left kidney stones.  She underwent lithotripsy for her multiple kidney stones. A nuclear perfusion study prior to potential  discontinuance of dual anti-platelet therapy on 11/05/2013  was interpreted as low risk, but did show extensive scar in the distal LAD to return distribution.  Post-rest ejection fraction was 40%.  There was evidence for apical akinesis/mild dyskinesis.  She has been documented to have episodes of nonsustained ventricular tachycardia and has seen Dr. commits..  Her bradycardia has been more profound while sleeping.  She was able to exercise and walk without limitation other than back discomfort.  She was evaluated for possible indication for pacemaker was not felt that a pacemaker is indicated presently.  Because of her bradycardia she has not been on beta blocker therapy.  She has had episodes of nonsustained VT noted on a CardioNet monitor.  When I I last saw her admited that her sleep was poor and nonrestorative.  She snored loudly and required frequent daytime naps.  He was sleeping at least 8 hours per night, but poor quality.  She underwent a split-night protocol sleep study on 03/08/2015 and was found to have severe sleep apnea with an AHI of 35.8 overall and more severe with REM sleep with an AHI of 67.5 per hour.  She had significant oxygen desaturation to a nadir of 77%.  There was loud snoring.  I recommended tai chi CPAP therapy at 9 cm water pressure.  Since initiating CPAP therapy.  She has felt well.  She is no longer having to take naps.  She is using choice for DME company.  When I saw her one year ago, she was meeting Medicare compliance. A download was obtained from 05/01/2015 through 05/30/2015 with usage stays at 93% and 70%  of days greater than 4 hours.  Her AHI was excellent at 1.9 with a set pressure of 9 cm.    Over the past year, she was diagnosed with endometrial cancer and underwent a total hysterectomy.  She also has a Port-A-Cath that had undergone chemotherapy and also completed 25 radiation remains to her body and 3 intravaginally..  She has developed a neuropathy for which he  takes gabapentin.  He is unaware of chest pain or palpitations.    This past year, she continues to admit to 100% use with CPAP.  A download from 06/12/2016 through 06/13/2016 100% of usage stays.  She has a ResMed AirSense10 AutoSet unit set at a pressure of 9 cm.  Her AHI is 1.1.  She is using a Risk manager &Paykel Eson nasal mask.  There is no leak.    Since I last saw her in March 2018, she has done well from a cardiac standpoint.  She denies chest pain, PND, orthopnea, or palpitations.  She continues to teach water aerobics.  However, she has continued to have issues as result of her radiation and chemotherapy for her endometrial CA.  She has developed a neuropathy and as result, has been walking with a cane or a walker.  Her gabapentin dose has been increased to 3 times per day.  She also has a small fracture in her sacrum.  She underwent initial evaluation at [facility] for possible kyphoplasty, but ultimately this was not done.  She also has degenerative disc disease in L3-L5 for which she has been seen pain management.  She has undergone injection.  She we having an additional injection next week and will need to hold Brilinta.  She also has had radiation-induced urinary incontinence.  A recent CT has demonstrated 5 small nodules in her long and she will be undergoing a follow-up CT in December.  She continues to use CPAP with 100% compliance.  A download  from 11/27/2016 -12/26/2016 confirmed 100% compliance . AHI was1.2 at her 9 cm water pressure.  She was averaging 7 hours and 57 minutes of sleep per night.  An Epworth Sleepiness Scale score was 1.  Since I last saw her on October 3, 018 she she was found to have significant bradycardia on event monitor.  She had heart rates down to the mid to low 30s.  There were 2 episodes where her heart rate dropped to 25 bpm and then 19 bpm.  She was not on any AV nodal blocking drugs.  I recommended that she go to the emergency room for evaluation.  She was admitted  overnight.  She was evaluated by [name] the following day.  He reviewed her Holter monitor.  At that time, no AF was appreciated but there were periods of Mobitz 1 block, rates in the 20s with pauses and junctional escape beats.  Her telemetry on that particular day was showing sinus bradycardia in the 40s-50s.  The patient admitted that she was entirely asymptomatic.  At that point the decision was made not to place a permanent pacemaker.  She subsequently saw [facility] in office follow-up in January 2019.  When I saw her in March 2019 she was much more tired and sleepy.  She was not using CPAP since she was coughing.  She also was using Mucinex DM for her chest congestion.  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  She has issues with back discomfort.  During that evaluation,  her ECG showed that she was in atrial fibrillation at 67 bpm with left bundle branch block with a rates variable ranging from low 40s to approximately 88.  An echo Doppler study on June 27, 2017 showed an EF of 40 to 45%.  There was mild aortic stenosis with a valve area estimated 1.82 cm.  She had moderate left atrial dilatation.  There was mild pulmonary hypertension with a PA peak pressure 36 mm.    She saw [name] for cardiology/EP follow-up on July 17, 2017.  At that time she was not having any episodes of significant dizziness, presyncope or syncope.  They discussed an attempt at trying to get her back into sinus rhythm and she has been on Eliquis.  She underwent an outpatient successful cardioversion on Jul 27, 2017 for restoration of sinus rhythm at 1 shock at 150 J.   She has had issues with urinary incontinence as result of damage from radiation treatment.  She admits to 1-2+ ankle swelling.  She is unaware of her cardiac rhythm.  She is now back on CPAP with 100% compliance and is no longer coughing.  She presents for reevaluation.  Past Medical  History:  Diagnosis Date  . Bradycardia   . Chronic back pain   . Coronary artery disease    a. s/p STEMI in 2014 with DES to LAD with staged PCI/DES placement to LCx  . Echoencephalogram abnormality   . Heart murmur   . History of Holter monitoring   . Hyperlipidemia   . Hypertension   . Myocardial infarction ([place])   . Neuromuscular disorder (HCC)    neuropathy  . NSVT (nonsustained ventricular tachycardia) ([place])   . Personal history of chemotherapy    ENDOMETRIAL CA  . Personal history of radiation therapy    ENDOMETRIAL CA  . Uterine cancer ([place])    serrous    Past Surgical History:  Procedure Laterality Date  . ABDOMINAL HYSTERECTOMY    . BREAST EXCISIONAL BIOPSY Right 1956   NEG  . CARDIOVERSION N/A 07/27/2017   Procedure: CARDIOVERSION;  Surgeon: Skeet Latch, MD;  Location: [place];  Service: Cardiovascular;  Laterality: N/A;  . LEFT HEART CATHETERIZATION WITH CORONARY ANGIOGRAM  10/19/2012   Procedure: LEFT HEART CATHETERIZATION WITH CORONARY ANGIOGRAM;  Surgeon: Troy Sine, MD;  Location: [facility] CATH LAB;  Service: Cardiovascular;;  . PERCUTANEOUS CORONARY STENT INTERVENTION (PCI-S)  10/19/2012   Procedure: PERCUTANEOUS CORONARY STENT INTERVENTION (PCI-S);  Surgeon: Troy Sine, MD;  Location: [facility] CATH LAB;  Service: Cardiovascular;;  . PERCUTANEOUS CORONARY STENT INTERVENTION (PCI-S) N/A 10/23/2012   Procedure: PERCUTANEOUS CORONARY STENT INTERVENTION (PCI-S);  Surgeon: Troy Sine, MD;  Location: [facility] CATH LAB;  Service: Cardiovascular;  Laterality: N/A;  . PERIPHERAL VASCULAR CATHETERIZATION N/A 05/05/2015   Procedure: Glori Luis Cath Insertion;  Surgeon: Algernon Huxley, MD;  Location: [place] CV LAB;  Service: Cardiovascular;  Laterality: N/A;  . PORTA CATH REMOVAL N/A 04/05/2017   Procedure: PORTA CATH REMOVAL;  Surgeon: Algernon Huxley, MD;  Location: [place] CV LAB;  Service: Cardiovascular;  Laterality: N/A;    Allergies  Allergen Reactions  . Ace  Inhibitors Cough  . Penicillins Rash and Other (See Comments)    Has patient had a PCN reaction causing immediate rash, facial/tongue/throat swelling, SOB or lightheadedness with hypotension: No Has patient had a PCN reaction causing severe rash involving mucus membranes or skin necrosis: No Has patient had a PCN reaction that required hospitalization: No Has patient  had a PCN reaction occurring within the last 10 years: No If all of the above answers are "NO", then may proceed with Cephalosporin use.     Current Outpatient Medications  Medication Sig Dispense Refill  . acetaminophen (TYLENOL) 650 MG CR tablet Take 650 mg by mouth every 8 (eight) hours as needed for pain.    [name] apixaban (ELIQUIS) 5 MG TABS tablet Take 1 tablet (5 mg total) by mouth 2 (two) times daily. 60 tablet 6  . aspirin EC 81 MG tablet Take 81 mg by mouth daily.    [name] atorvastatin (LIPITOR) 80 MG tablet TAKE 1 TABLET (80 MG TOTAL) BY MOUTH DAILY AT 6 PM. (Patient taking differently: Take 80 mg by mouth at bedtime. ) 90 tablet 1  . Calcium-Magnesium-Vitamin D (CALCIUM 1200+D3 PO) Take 1 tablet by mouth at bedtime.    . cetirizine (ZYRTEC) 10 MG tablet Take 10 mg by mouth daily.    [name] ezetimibe (ZETIA) 10 MG tablet Take 1 tablet (10 mg total) by mouth daily. 30 tablet 0  . isosorbide mononitrate (IMDUR) 30 MG 24 hr tablet TAKE 1 TABLET (30 MG TOTAL) BY MOUTH DAILY. 90 tablet 1  . loperamide (IMODIUM A-D) 2 MG tablet Take 2 mg by mouth 4 (four) times daily as needed for diarrhea or loose stools.    [name] losartan (COZAAR) 50 MG tablet TAKE 1 TABLET BY MOUTH EVERY DAY (Patient taking differently: TAKE 1 TABLET BY MOUTH EVERY DAY AT NIGHT.) 90 tablet 3  . Multiple Vitamin (MULTIVITAMIN WITH MINERALS) TABS Take 1 tablet by mouth daily.    [name] NITROSTAT 0.4 MG SL tablet PLACE 1 TABLET UNDER THE TOUGUE EVERY 5 MINUTES FOR UP TO 3 DOSES AS NEEDED FOR CHEST PAIN 25 tablet 0  . omeprazole (PRILOSEC) 20 MG capsule Take 20 mg by mouth daily.     . vitamin B-12 (CYANOCOBALAMIN) 1000 MCG tablet Take 1,000 mcg by mouth daily.    . fluticasone (FLONASE) 50 MCG/ACT nasal spray Place 2 sprays into both nostrils daily.     . furosemide (LASIX) 20 MG tablet Take 1 tablet (20 mg total) by mouth daily. 90 tablet 3   No current facility-administered medications for this visit.    Facility-Administered Medications Ordered in Other Visits  Medication Dose Route Frequency Provider Last Rate Last Dose  . sodium chloride flush (NS) 0.9 % injection 10 mL  10 mL Intravenous PRN Cammie Sickle, MD   10 mL at 06/06/16 1447  . sodium chloride flush (NS) 0.9 % injection 10 mL  10 mL Intravenous PRN Cammie Sickle, MD   10 mL at 08/15/16 1404    Social History   Socioeconomic History  . Marital status: Married    Spouse name: Not on file  . Number of children: Not on file  . Years of education: Not on file  . Highest education level: Not on file  Occupational History  . Not on file  Social Needs  . Financial resource strain: Not on file  . Food insecurity:    Worry: Not on file    Inability: Not on file  . Transportation needs:    Medical: Not on file    Non-medical: Not on file  Tobacco Use  . Smoking status: Former Smoker    Last attempt to quit: 1976    Years since quitting: 43.4  . Smokeless tobacco: Never Used  Substance and Sexual Activity  . Alcohol use: Yes    Alcohol/week: 0.6  oz    Types: 1 Glasses of wine per week  . Drug use: No  . Sexual activity: Yes  Lifestyle  . Physical activity:    Days per week: Not on file    Minutes per session: Not on file  . Stress: Not on file  Relationships  . Social connections:    Talks on phone: Not on file    Gets together: Not on file    Attends religious service: Not on file    Active member of club or organization: Not on file    Attends meetings of clubs or organizations: Not on file    Relationship status: Not on file  . Intimate partner violence:    Fear of  current or ex partner: Not on file    Emotionally abused: Not on file    Physically abused: Not on file    Forced sexual activity: Not on file  Other Topics Concern  . Not on file  Social History Narrative  . Not on file    Socially she is married. There is no tobacco use. She drinks alcohol rarely.  Family History  Problem Relation Age of Onset  . Heart disease Mother   . Breast cancer Mother 63  . Heart disease Father   . Diabetes Father   . Diabetes Brother   . Stroke Brother   . Kidney cancer Neg Hx   . Bladder Cancer Neg Hx     ROS General: Negative; No fevers, chills, or night sweats;  HEENT: Negative; No changes in vision or hearing, sinus congestion, difficulty swallowing Pulmonary: Negative; No cough, wheezing, shortness of breath, hemoptysis Cardiovascular:  See HPI GI: Positive for GERD; No nausea, vomiting, diarrhea, or abdominal pain GY:IRSWNIO of previous kidney stones, contributing to left flank pain intermittently; No dysuria, hematuria, or difficulty voiding; radiation-induced urinary incontinence Musculoskeletal: Positive for small sacral fracture, L3-L5 degenerative disc disease. Hematologic/Oncology: Positive for endometrial cancer, status post chemotherapy and radiation treatments. Endocrine: Negative; no heat/cold intolerance; no diabetes Neuro: Negative; no changes in balance, headaches Skin: Negative; No rashes or skin lesions Psychiatric: Negative; No behavioral problems, depression Sleep: Positive for obstructive sleep apnea, on CPAP therapy.  No breakthrough snoring.  No daytime sleepiness. Other comprehensive 14 point system review is negative.   PE BP 124/66   Pulse (!) 53   Ht _0  (1.626 m)   Wt 208 lb (94.3 kg)   BMI 35.70 kg/m    Repeat blood pressure by me was 120/68  Wt Readings from Last 3 Encounters:  09/03/17 208 lb (94.3 kg)  08/22/17 208 lb 8 oz (94.6 kg)  08/13/17 208 lb 8 oz (94.6 kg)   General: Alert, oriented, no  distress.  Skin: normal turgor, no rashes, warm and dry HEENT: Normocephalic, atraumatic. Pupils equal round and reactive to light; sclera anicteric; extraocular muscles intact;  Nose without nasal septal hypertrophy Mouth/Parynx benign; Mallinpatti scale Neck: No JVD, no carotid bruits; normal carotid upstroke Lungs: clear to ausculatation and percussion; no wheezing or rales Chest wall: without tenderness to palpitation Heart: PMI not displaced, irregularly irregular, bradycardic in the 50s S1 s2 normal, 1/6 systolic murmur, no diastolic murmur, no rubs, gallops, thrills, or heaves Abdomen: soft, nontender; no hepatosplenomehaly, BS+; abdominal aorta nontender and not dilated by palpation. Back: no CVA tenderness Pulses 2+ Musculoskeletal: full range of motion, normal strength, no joint deformities Extremities: Increased ankle edema now 1-2+ ; no clubbing cyanosis, Homan's sign negative  Neurologic: grossly nonfocal; Cranial nerves grossly  wnl Psychologic: Normal mood and affect   ECG (independently read by me): Atrial fibrillation with ventricular rate at 53 bpm.  Left bundle branch block with repolarization changes.  QTc interval 4 1 ms.  June 15, 2017 ECG (independently read by me): Atrial fibrillation at 67 bpm.  Left bundle branch block. Variable rate ranging from probably in the , low 40s to approximately 88  October 2018 ECG (independently read by me): Marked sinus bradycardia with PACs.  Ventricular rate at 43.  Left bundle branch block with repolarization changes.  March 2018 ECG (independently read by me): Sinus bradycardia 57 bpm.  First-degree AV block with a PR interval 314 ms. .She now has a left bundle-branch block which is new since her prior tracing.  March 2017 ECG (independently read by me): Sinus bradycardia 52 bpm with first-degree block.  PR interval 284 ms.  LVH by voltage.  QS V1 through V3 concordant with old anteroseptal MI.  March 2016 ECG (independently read  by me): Normal sinus rhythm.  Old anteroseptal MI.  First-degree AV block.  October 27, 2013 ECG (independently read by me): Normal sinus rhythm at 60 beats per minute.  First degree AV block with PR interval at 276 ms.  Old anteroseptal MI with QS complex in V1 and V2.  EKG (independently read by me): Sinus rhythm at 55 beats per minute. First degree AV block. Poor progression compatible with her prior septal infarction. T-wave changes anterolaterally.  LABS: BMP Latest Ref Rng & Units 07/27/2017 03/08/2017 01/17/2017  Glucose 65 - 99 mg/dL 96 142(H) 102(H)  BUN 6 - 20 mg/dL - 17 17  Creatinine 0.44 - 1.00 mg/dL - 1.04(H) 1.02(H)  Sodium 135 - 145 mmol/L 142 138 140  Potassium 3.5 - 5.1 mmol/L 3.8 4.0 3.8  Chloride 101 - 111 mmol/L - 105 104  CO2 22 - 32 mmol/L - 26 26  Calcium 8.9 - 10.3 mg/dL - 9.3 9.8   Hepatic Function Latest Ref Rng & Units 06/06/2017 03/08/2017 12/07/2016  Total Protein 6.0 - 8.5 g/dL 6.4 6.4(L) 6.7  Albumin 3.5 - 4.7 g/dL 3.9 3.8 4.0  AST 0 - 40 IU/L 23 26 33  ALT 0 - 32 IU/L _0 Alk Phosphatase 39 - 117 IU/L 102 110 110  Total Bilirubin 0.0 - 1.2 mg/dL 0.4 0.6 0.7  Bilirubin, Direct 0.00 - 0.40 mg/dL 0.15 - -   CBC Latest Ref Rng & Units 07/27/2017 03/08/2017 01/17/2017  WBC 3.6 - 11.0 K/uL - 5.0 6.3  Hemoglobin 12.0 - 15.0 g/dL 12.6 11.7(L) 13.0  Hematocrit 36.0 - 46.0 % 37.0 35.1 38.7  Platelets 150 - 440 K/uL - 230 233   Lab Results  Component Value Date   MCV 91.8 03/08/2017   MCV 91.0 01/17/2017   MCV 89.4 12/07/2016   Lab Results  Component Value Date   TSH 0.97 06/19/2016   Lab Results  Component Value Date   HGBA1C 5.7 (H) 10/19/2012   Lipid Panel     Component Value Date/Time   CHOL 116 06/06/2017 0922   CHOL 141 04/16/2013 0924   TRIG 75 06/06/2017 0922   TRIG 112 04/16/2013 0924   HDL 51 06/06/2017 0922   HDL 53 04/16/2013 0924   CHOLHDL 2.3 06/06/2017 0922   CHOLHDL 2.2 06/19/2016 0950   VLDL 20 06/19/2016 0950   LDLCALC 50  06/06/2017 0922   LDLCALC 66 04/16/2013 0924   RADIOLOGY: No results found.  IMPRESSION:  1. Persistent atrial fibrillation (  [place])   2. Coronary artery disease involving native coronary artery of native heart without angina pectoris   3. Left bundle branch block   4. Ankle edema, bilateral   5. Medication management   6. Anticoagulation adequate   7. OSA (obstructive sleep apnea)   8. Mild aortic stenosis   9. Urinary incontinence, unspecified type   10. Old MI (myocardial infarction): Anterior STEMI 10/19/2012     ASSESSMENT AND PLAN: Ms. Carole Binning is a 83 year-old female who presented to [facility] on 10/19/2012 with a STEMI after an approximate 12 hour delay with back and chest discomfort. When she arrived to [facility] she had excellent door to balloon time at 27 minutes in the setting of an anterior wall ST segment elevation myocardial infarction. She underwent successful stenting of her LAD and because of diffuse disease tandem DES stents were inserted.  She underwent staged intervention to the circumflex  4 days later.. She had concomitant 50 - 60% RCA narrowing and a large dominant right coronary artery.  She has a history of ACE inhibitor induced cough, but has tolerated ARB therapy with losartan .  A nuclear perfusion study  demonstrated significant salvage of myocardium in the majority of the LAD territory and showed distal apical scar.  She has chronic left bundle branch block.  I saw her in March, she was in atrial fibrillation and at that time her Kary Kos was discontinued and I started her on Eliquis 5 mg twice a day for anticoagulation.  Her cha2ds2vasc score is 4.   Her echo Doppler was reviewed with her and showed an EF of 40 to 45% with moderate LVH and septal and apical akinesis secondary to her prior late presentation anterior MI.  There was very mild aortic stenosis.  She had moderate left atrial dilatation.  She had undergone successful cardioversion but  unfortunately irritate ECG today reveals recurrent atrial fibrillation which she was unaware.  She is asymptomatic and he specifically denies symptoms of dizziness.  She has more peripheral edema today.  I am starting her on furosemide 20 mg daily.  I am checking a bmet, magnesium, TSH and CBC level.  She continues to have issues with urinary incontinence secondary to damage from her radiation treatments.  She is now back on CPAP therapy and admits to 100% compliance.  I have recommended she see [name] in follow-up.  I will see her in 3 to 4 months for reevaluation.  Time spent: 25 minutes Troy Sine, MD, [facility]  09/09/2017 2:10 PM

## 2017-09-04 ENCOUNTER — Ambulatory Visit
Admission: RE | Admit: 2017-09-04 | Discharge: 2017-09-04 | Disposition: A | Discharge status: Home / Self Care | Payer: Medicare Other | Source: Ambulatory Visit | Attending: Hematology and Oncology | Admitting: Hematology and Oncology

## 2017-09-04 DIAGNOSIS — I2510 Atherosclerotic heart disease of native coronary artery without angina pectoris: Secondary | ICD-10-CM | POA: Diagnosis not present

## 2017-09-04 DIAGNOSIS — I700 Atherosclerosis of aorta: Secondary | ICD-10-CM | POA: Insufficient documentation

## 2017-09-04 DIAGNOSIS — R918 Other nonspecific abnormal finding of lung field: Secondary | ICD-10-CM | POA: Diagnosis present

## 2017-09-06 ENCOUNTER — Other Ambulatory Visit: Payer: Medicare Other

## 2017-09-06 ENCOUNTER — Ambulatory Visit: Payer: Medicare Other | Admitting: Hematology and Oncology

## 2017-09-09 ENCOUNTER — Encounter: Payer: Self-pay | Admitting: Cardiovascular Disease

## 2017-09-13 ENCOUNTER — Inpatient Hospital Stay: Payer: Medicare Other | Attending: Hematology and Oncology | Admitting: Hematology and Oncology

## 2017-09-13 ENCOUNTER — Inpatient Hospital Stay: Payer: Medicare Other

## 2017-09-13 ENCOUNTER — Other Ambulatory Visit: Payer: Self-pay

## 2017-09-13 ENCOUNTER — Encounter: Payer: Self-pay | Admitting: Hematology and Oncology

## 2017-09-13 ENCOUNTER — Other Ambulatory Visit: Payer: Self-pay | Admitting: Cardiovascular Disease

## 2017-09-13 VITALS — BP 143/38 | HR 56 | Temp 97.9°F | Resp 18 | Wt 209.1 lb

## 2017-09-13 DIAGNOSIS — Z9221 Personal history of antineoplastic chemotherapy: Secondary | ICD-10-CM | POA: Diagnosis not present

## 2017-09-13 DIAGNOSIS — Z923 Personal history of irradiation: Secondary | ICD-10-CM

## 2017-09-13 DIAGNOSIS — I10 Essential (primary) hypertension: Secondary | ICD-10-CM | POA: Insufficient documentation

## 2017-09-13 DIAGNOSIS — Z8542 Personal history of malignant neoplasm of other parts of uterus: Secondary | ICD-10-CM | POA: Diagnosis not present

## 2017-09-13 DIAGNOSIS — M549 Dorsalgia, unspecified: Secondary | ICD-10-CM | POA: Diagnosis not present

## 2017-09-13 DIAGNOSIS — I2510 Atherosclerotic heart disease of native coronary artery without angina pectoris: Secondary | ICD-10-CM | POA: Diagnosis not present

## 2017-09-13 DIAGNOSIS — Z90710 Acquired absence of both cervix and uterus: Secondary | ICD-10-CM | POA: Diagnosis not present

## 2017-09-13 DIAGNOSIS — G8929 Other chronic pain: Secondary | ICD-10-CM | POA: Insufficient documentation

## 2017-09-13 DIAGNOSIS — R918 Other nonspecific abnormal finding of lung field: Secondary | ICD-10-CM

## 2017-09-13 DIAGNOSIS — G629 Polyneuropathy, unspecified: Secondary | ICD-10-CM | POA: Diagnosis not present

## 2017-09-13 DIAGNOSIS — N3941 Urge incontinence: Secondary | ICD-10-CM

## 2017-09-13 DIAGNOSIS — C541 Malignant neoplasm of endometrium: Secondary | ICD-10-CM

## 2017-09-13 DIAGNOSIS — I252 Old myocardial infarction: Secondary | ICD-10-CM | POA: Diagnosis not present

## 2017-09-13 DIAGNOSIS — Z87891 Personal history of nicotine dependence: Secondary | ICD-10-CM | POA: Insufficient documentation

## 2017-09-13 LAB — COMPREHENSIVE METABOLIC PANEL
ALT: 16 U/L (ref 14–54)
AST: 33 U/L (ref 15–41)
Albumin: 4 g/dL (ref 3.5–5.0)
Alkaline Phosphatase: 90 U/L (ref 38–126)
Anion gap: 7 (ref 5–15)
BUN: 19 mg/dL (ref 6–20)
CO2: 24 mmol/L (ref 22–32)
Calcium: 9.6 mg/dL (ref 8.9–10.3)
Chloride: 110 mmol/L (ref 101–111)
Creatinine, Ser: 0.9 mg/dL (ref 0.44–1.00)
GFR calc Af Amer: >60 mL/min (ref >60)
GFR calc non Af Amer: 59 mL/min — ABNORMAL LOW (ref >60)
Glucose, Bld: 114 mg/dL — ABNORMAL HIGH (ref 65–99)
Potassium: 3.8 mmol/L (ref 3.5–5.1)
Sodium: 141 mmol/L (ref 135–145)
Total Bilirubin: 0.8 mg/dL (ref 0.3–1.2)
Total Protein: 6.3 g/dL — ABNORMAL LOW (ref 6.5–8.1)

## 2017-09-13 LAB — CBC WITH DIFFERENTIAL/PLATELET
Basophils Absolute: 0 10*3/uL (ref 0–0.1)
Basophils Relative: 1 %
Eosinophils Absolute: 0.1 10*3/uL (ref 0–0.7)
Eosinophils Relative: 1 %
HCT: 36.3 % (ref 35.0–47.0)
Hemoglobin: 12.3 g/dL (ref 12.0–16.0)
Lymphocytes Relative: 20 %
Lymphs Abs: 1.1 10*3/uL (ref 1.0–3.6)
MCH: 31.2 pg (ref 26.0–34.0)
MCHC: 34 g/dL (ref 32.0–36.0)
MCV: 91.7 fL (ref 80.0–100.0)
Monocytes Absolute: 0.5 10*3/uL (ref 0.2–0.9)
Monocytes Relative: 9 %
Neutro Abs: 3.8 10*3/uL (ref 1.4–6.5)
Neutrophils Relative %: 69 %
Platelets: 217 10*3/uL (ref 150–440)
RBC: 3.95 MIL/uL (ref 3.80–5.20)
RDW: 13.9 % (ref 11.5–14.5)
WBC: 5.6 10*3/uL (ref 3.6–11.0)

## 2017-09-13 NOTE — Progress Notes (Signed)
Susan [name] day:  09/13/2017   Chief Complaint: Susan [name] is a 83 y.o. female with stage IIIC uterine cancer who is seen for 6 month assessment and review of interval chest CT.  HPI:  The patient was last seen in the medical oncology [name] on 03/08/2017.  At that time, she had continued back pain.  She had bradycardia with plan for a pacemaker.  Exam was stable. Labs were unremarkable.   She underwent port-a-cath removal on 04/05/2017.  She underwent cardioversion on 07/27/2017.  Bilateral mammogram on 05/11/2017 revealed no evidence of malignancy.  Chest CT on 09/04/2017 revealed no acute cardiopulmonary abnormalities.  The small pulmonary nodules were stable.  The largest nodule was 6 mm.  Future CT at 18-24 months (from 12/05/2016) was considered optional for low-risk patients, but was recommended for high-risk patients.  She was seen by Beckey Rutter, NP for gynecology-oncology on 08/22/2017.  Exam revealed no evidence of disease.  During the interim, patient is doing well today, and does not express any acute concerns. Patient denies any increased shortness of breath. Patient denies B symptoms. She has not experienced any significant interval infections. She has urinary incontinence. She plans to start bladder stimulation treatments in July 2019.   Patient has chronic back pain. She is seeing pain management. A procedure where the "nerves would be burned" was recommended, however patient has decided against it. She is currently seeing a Restaurant manager, fast food. Patient has stable neuropathy in her hands and feet.  She is driving again because her husband had a MI and is unable to bring her to appointments. Previously had stopped driving secondary to her neuropathy.   Patient notes that she is eating well. Weight has increased by 3 pounds.   Patient complains of back pain rated 3/10 in the [name] today.  She is going to a chiropractor once a month.   Patient has a trip planned in August (10 day cruise).   Past Medical History:  Diagnosis Date  . Bradycardia   . Chronic back pain   . Coronary artery disease    a. s/p STEMI in 2014 with DES to LAD with staged PCI/DES placement to LCx  . Echoencephalogram abnormality   . Heart murmur   . History of Holter monitoring   . Hyperlipidemia   . Hypertension   . Myocardial infarction ([place])   . Neuromuscular disorder ([facility])    neuropathy  . NSVT (nonsustained ventricular tachycardia) ([place])   . Personal history of chemotherapy    ENDOMETRIAL CA  . Personal history of radiation therapy    ENDOMETRIAL CA  . Uterine cancer ([place])    serrous    Past Surgical History:  Procedure Laterality Date  . ABDOMINAL HYSTERECTOMY    . BREAST EXCISIONAL BIOPSY Right 1956   NEG  . CARDIOVERSION N/A 07/27/2017   Procedure: CARDIOVERSION;  Surgeon: Skeet Latch, MD;  Location: [place];  Service: Cardiovascular;  Laterality: N/A;  . LEFT HEART CATHETERIZATION WITH CORONARY ANGIOGRAM  10/19/2012   Procedure: LEFT HEART CATHETERIZATION WITH CORONARY ANGIOGRAM;  Surgeon: Troy Sine, MD;  Location: [facility] CATH LAB;  Service: Cardiovascular;;  . PERCUTANEOUS CORONARY STENT INTERVENTION (PCI-S)  10/19/2012   Procedure: PERCUTANEOUS CORONARY STENT INTERVENTION (PCI-S);  Surgeon: Troy Sine, MD;  Location: [facility] CATH LAB;  Service: Cardiovascular;;  . PERCUTANEOUS CORONARY STENT INTERVENTION (PCI-S) N/A 10/23/2012   Procedure: PERCUTANEOUS CORONARY STENT INTERVENTION (PCI-S);  Surgeon: Troy Sine, MD;  Location: [facility] CATH  LAB;  Service: Cardiovascular;  Laterality: N/A;  . PERIPHERAL VASCULAR CATHETERIZATION N/A 05/05/2015   Procedure: Glori Luis Cath Insertion;  Surgeon: Algernon Huxley, MD;  Location: [place] CV LAB;  Service: Cardiovascular;  Laterality: N/A;  . PORTA CATH REMOVAL N/A 04/05/2017   Procedure: PORTA CATH REMOVAL;  Surgeon: Algernon Huxley, MD;  Location: [place] CV LAB;  Service:  Cardiovascular;  Laterality: N/A;    Family History  Problem Relation Age of Onset  . Heart disease Mother   . Breast cancer Mother 1  . Heart disease Father   . Diabetes Father   . Diabetes Brother   . Stroke Brother   . Kidney cancer Neg Hx   . Bladder Cancer Neg Hx     Social History:  reports that she quit smoking about 43 years ago. She has never used smokeless tobacco. She reports that she drinks about 0.6 oz of alcohol per week. She reports that she does not use drugs.  She is originally from [place].  She lives in [place].  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  She and her husband will be married 19 years in 11/2016.  Her husband's name is [name].  She has a trip planned in August (10 day cruise). The patient is alone today.  Allergies:  Allergies  Allergen Reactions  . Ace Inhibitors Cough  . Penicillins Rash and Other (See Comments)    Has patient had a PCN reaction causing immediate rash, facial/tongue/throat swelling, SOB or lightheadedness with hypotension: No Has patient had a PCN reaction causing severe rash involving mucus membranes or skin necrosis: No Has patient had a PCN reaction that required hospitalization: No Has patient had a PCN reaction occurring within the last 10 years: No If all of the above answers are "NO", then may proceed with Cephalosporin use.     Current Medications: Current Outpatient Medications  Medication Sig Dispense Refill  . acetaminophen (TYLENOL) 650 MG CR tablet Take 650 mg by mouth every 8 (eight) hours as needed for pain.    [name] apixaban (ELIQUIS) 5 MG TABS tablet Take 1 tablet (5 mg total) by mouth 2 (two) times daily. 60 tablet 6  . aspirin EC 81 MG tablet Take 81 mg by mouth daily.    [name] atorvastatin (LIPITOR) 80 MG tablet TAKE 1 TABLET (80 MG TOTAL) BY MOUTH DAILY AT 6 PM. 90 tablet 1  . Calcium-Magnesium-Vitamin D (CALCIUM 1200+D3 PO) Take 1 tablet by mouth 2 (two) times daily.     [name] ezetimibe  (ZETIA) 10 MG tablet Take 1 tablet (10 mg total) by mouth daily. 30 tablet 0  . fluticasone (FLONASE) 50 MCG/ACT nasal spray SPRAY 2 SPRAYS INTO EACH NOSTRIL EVERY DAY    . furosemide (LASIX) 20 MG tablet Take 1 tablet (20 mg total) by mouth daily. 90 tablet 3  . isosorbide mononitrate (IMDUR) 30 MG 24 hr tablet TAKE 1 TABLET (30 MG TOTAL) BY MOUTH DAILY. 90 tablet 1  . loperamide (IMODIUM A-D) 2 MG tablet Take 2 mg by mouth 4 (four) times daily as needed for diarrhea or loose stools.    [name] losartan (COZAAR) 50 MG tablet TAKE 1 TABLET BY MOUTH EVERY DAY (Patient taking differently: TAKE 1 TABLET BY MOUTH EVERY DAY AT NIGHT.) 90 tablet 3  . Multiple Vitamin (MULTIVITAMIN WITH MINERALS) TABS Take 1 tablet by mouth daily.    [name] NITROSTAT 0.4 MG SL tablet PLACE 1 TABLET UNDER THE TOUGUE  EVERY 5 MINUTES FOR UP TO 3 DOSES AS NEEDED FOR CHEST PAIN 25 tablet 0  . omeprazole (PRILOSEC) 20 MG capsule Take 20 mg by mouth daily.    . vitamin B-12 (CYANOCOBALAMIN) 1000 MCG tablet Take 1,000 mcg by mouth daily.     No current facility-administered medications for this visit.    Facility-Administered Medications Ordered in Other Visits  Medication Dose Route Frequency Provider Last Rate Last Dose  . sodium chloride flush (NS) 0.9 % injection 10 mL  10 mL Intravenous PRN Cammie Sickle, MD   10 mL at 06/06/16 1447  . sodium chloride flush (NS) 0.9 % injection 10 mL  10 mL Intravenous PRN Cammie Sickle, MD   10 mL at 08/15/16 1404    Review of Systems:  GENERAL:  Feels "the same".  No fevers, sweats.  Weight down 3 pounds. PERFORMANCE STATUS (ECOG): 2 HEENT:  No visual changes, runny nose, sore throat, mouth sores or tenderness. Lungs: No shortness of breath or cough.  No hemoptysis.  Sleep apnea on CPAP. Cardiac:  Atrial fibrillation.  Bradycardia.  No chest pain, palpitations, orthopnea, or PND.  MI in 2014. GI:  Frequent stools depending on diet.  No nausea, vomiting, diarrhea, constipation,  melena or hematochezia. GU:  Stress incontinence.  No urgency, frequency, dysuria, or hematuria. Musculoskeletal:  Back issues.  No joint pain.  No muscle tenderness. Extremities:  Bilateral lower extremity edema.  No pain. Skin:  No rashes or skin changes. Neuro:  Neuropathy in feet.  No headache, numbness or weakness, balance or coordination issues. Endocrine:  No diabetes, thyroid issues, hot flashes or night sweats. Psych:  No mood changes, depression or anxiety. Pain:  Low back ache pain (3 out of 10). Review of systems:  All other systems reviewed and found to be negative.   Physical Exam:  Blood pressure (!) 143/38, pulse (!) 56, temperature 97.9 F (36.6 C), temperature source Tympanic, resp. rate 18, weight 209 lb 2 oz (94.9 kg). GENERAL:  Well developed, well nourished, woman sitting comfortably in the exam room in no acute distress. MENTAL STATUS:  Alert and oriented to person, place and time. HEAD:  Short styled gray hair.  Normocephalic, atraumatic, face symmetric, no Cushingoid features. EYES:  Blue eyes.  Pupils equal round and reactive to light and accomodation.  No conjunctivitis or scleral icterus. ENT:  Oropharynx clear without lesion.  Tongue normal. Mucous membranes moist.  RESPIRATORY:  Clear to auscultation without rales, wheezes or rhonchi. CARDIOVASCULAR:  Bradycardia.  Irregular rhythm.  No murmur, rub or gallop. ABDOMEN:  Soft, non-tender, with active bowel sounds, and no hepatosplenomegaly.  No masses. SKIN:  No rashes, ulcers or lesions. EXTREMITIES:  Chronic lower extremity edema.  No skin discoloration or tenderness.  No palpable cords. LYMPH NODES: No palpable cervical, supraclavicular, axillary or inguinal adenopathy  NEUROLOGICAL: Unremarkable. PSYCH:  Appropriate.    Orders Only on 09/13/2017  Component Date Value Ref Range Status  . Sodium 09/13/2017 141  135 - 145 mmol/L Final  . Potassium 09/13/2017 3.8  3.5 - 5.1 mmol/L Final  . Chloride  09/13/2017 110  101 - 111 mmol/L Final  . CO2 09/13/2017 24  22 - 32 mmol/L Final  . Glucose, Bld 09/13/2017 114* 65 - 99 mg/dL Final  . BUN 09/13/2017 19  6 - 20 mg/dL Final  . Creatinine, Ser 09/13/2017 0.90  0.44 - 1.00 mg/dL Final  . Calcium 09/13/2017 9.6  8.9 - 10.3 mg/dL Final  . Total  Protein 09/13/2017 6.3* 6.5 - 8.1 g/dL Final  . Albumin 09/13/2017 4.0  3.5 - 5.0 g/dL Final  . AST 09/13/2017 33  15 - 41 U/L Final  . ALT 09/13/2017 16  14 - 54 U/L Final  . Alkaline Phosphatase 09/13/2017 90  38 - 126 U/L Final  . Total Bilirubin 09/13/2017 0.8  0.3 - 1.2 mg/dL Final  . GFR calc non Af Amer 09/13/2017 59* >60 mL/min Final  . GFR calc Af Amer 09/13/2017 >60  >60 mL/min Final   Comment: (NOTE) The eGFR has been calculated using the CKD EPI equation. This calculation has not been validated in all clinical situations. eGFR's persistently <60 mL/min signify possible Chronic Kidney Disease.   Georgiann Hahn gap 09/13/2017 7  5 - 15 Final   Performed at [facility], [address] [place], [place] [address]  . WBC 09/13/2017 5.6  3.6 - 11.0 K/uL Final  . RBC 09/13/2017 3.95  3.80 - 5.20 MIL/uL Final  . Hemoglobin 09/13/2017 12.3  12.0 - 16.0 g/dL Final  . HCT 09/13/2017 36.3  35.0 - 47.0 % Final  . MCV 09/13/2017 91.7  80.0 - 100.0 fL Final  . MCH 09/13/2017 31.2  26.0 - 34.0 pg Final  . MCHC 09/13/2017 34.0  32.0 - 36.0 g/dL Final  . RDW 09/13/2017 13.9  11.5 - 14.5 % Final  . Platelets 09/13/2017 217  150 - 440 K/uL Final  . Neutrophils Relative % 09/13/2017 69  % Final  . Neutro Abs 09/13/2017 3.8  1.4 - 6.5 K/uL Final  . Lymphocytes Relative 09/13/2017 20  % Final  . Lymphs Abs 09/13/2017 1.1  1.0 - 3.6 K/uL Final  . Monocytes Relative 09/13/2017 9  % Final  . Monocytes Absolute 09/13/2017 0.5  0.2 - 0.9 K/uL Final  . Eosinophils Relative 09/13/2017 1  % Final  . Eosinophils Absolute 09/13/2017 0.1  0 - 0.7 K/uL Final  . Basophils Relative 09/13/2017 1  % Final  .  Basophils Absolute 09/13/2017 0.0  0 - 0.1 K/uL Final   Performed at [facility], [address], [place], [place] [address]    Assessment:  Susan [name] is a 83 y.o. female with stage IIIC endometrial cancer s/p TLH/BSO and staging on 04/08/2015 at [facility].  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  Tumor invaded 2 mm into a 14 mm thick myometrium.  There was no cervical involvement.   There was lymphovascular invasion.  Washings were negative.  There were 3 positive lymph nodes in the pelvis (left external iliac node: 1 cm focus without extranodal extension; less than 0.1 mm focus in 1 external right iliac node and a 0.5 mm focus in another right external iliac node).  Pathologic stage was [id number].  Abdomen and pelvic CT on 03/30/2015 revealed fluid and/or soft tissue filling the endometrial cavity, potentially prolapsing through the cervical os into the upper vagina, compatible with the reported endometrial neoplasm.  There was no definite evidence of metastatic disease in the abdomen or pelvis.  There was multiple nonobstructive calculi in the left renal collecting system.  There was grade 1 spondylolisthesis of L4 upon L5 with extensive multilevel degenerative disc disease and facet arthropathy throughout the lumbar spine, including mild levoscoliosis.  She received 2 cycles of carboplatin and Taxol (05/12/2015 - 06/02/2015).  Chemotherapy was truncated secondary to a debilitating neuropathy.  She received 4500 cGy pelvic radiation and 1200 cGy using  high dose rate remote afterloading through vaginal cylinder (completed 10/18/2015).  Abdomen and pelvic CT on 08/31/2016 revealed no evidence of recurrent malignancy in the abdomen or pelvis.  There was a solid right middle lobe 3 mm pulmonary nodule, for which no prior comparison exists (this portion of the lungs was not included on the prior CT abdomen/pelvis study).  There was new patchy sclerosis throughout the sacrum, probably due to post treatment change. There was new irregular curvilinear nondisplaced sacral insufficiency fractures in the bilateral sacral ala.  There was additional findings include aortic atherosclerosis, coronary atherosclerosis, sequela of remote apical left ventricular myocardial infarction, nonobstructing left nephrolithiasis, and small hiatal hernia.  Chest CT on 12/05/2016 revealed scattered right lung nodules measuring up to 6 mm (mean diameter) in the right upper lobe along the minor fissure.  Metastatic disease was not excluded.  Chest CT on 03/06/2017 revealed stable tiny sub-cm right lung nodules, probably benign. Recommendation was for continued followup by chest CT in 6 months.  Chest CT on 09/04/2017 revealed no acute cardiopulmonary abnormalities.  The small pulmonary nodules were stable.  The largest nodule was 6 mm.  CA125 has been followed:  82.5 on 04/28/2015, 22.0 on 06/02/2015, 18.2 on 07/14/2015, 11.7 on 10/21/2015, 16.4 on 01/21/2016, 14.2 on 04/25/2016, 13.6 on 08/15/2016, 16.2 on 12/07/2016, 16.1 on 03/08/2017, and 13.8 on 09/13/2017.  She was started on Neurontin for neuropathy.  Higher doses made her "fuzzy headed".  She has urge incontinence.  She is scheduled for urodynamics testing on 09/08/2016.  Symptomatically, she has chronic back pain.  Exam is stable. Labs are unremarkable.   Plan: 1. Labs today:  CBC with diff, CMP, CA125. 2. Review interval chest CT- nodules stable.  Next chest CT between 06/05/2018 - 12/06/2018. 3. Discuss ongoing surveillance.  Exam every 3-6 months for 2-3 years, then every 6-12 months for 3-5 years and then annually.  Imaging based on clinical indication. 4. RTC in 6 months MD assessment, labs (CBC with diff, CMP, CA125).   Honor Loh, NP  09/13/2017, 2:07 PM   I saw and evaluated the patient, participating in the key portions of the service and reviewing pertinent diagnostic  studies and records.  I reviewed the nurse practitioner's note and agree with the findings and the plan.  The assessment and plan were discussed with the patient.  A few questions were asked by the patient and answered.   Nolon Stalls, MD 09/13/2017,2:07 PM

## 2017-09-13 NOTE — Progress Notes (Signed)
Pt in for follow up and test results.  Denies any concerns today.  Starting "treatments for urinary incontinence next week with Dr Vikki Ports.

## 2017-09-14 LAB — CA 125: Cancer Antigen (CA) 125: 13.8 U/mL (ref 0.0–38.1)

## 2017-09-25 ENCOUNTER — Ambulatory Visit (INDEPENDENT_AMBULATORY_CARE_PROVIDER_SITE_OTHER): Payer: Medicare Other

## 2017-09-25 DIAGNOSIS — N3946 Mixed incontinence: Secondary | ICD-10-CM | POA: Diagnosis not present

## 2017-09-25 NOTE — Progress Notes (Signed)
PTNS  Session # 1  Health & Social Factors: None Caffeine: 1 Alcohol: 0 Daytime voids #per day: 8 Night-time voids #per night: 0 Urgency: N/A Incontinence Episodes #per day: Pt states she leaks every time she stands Ankle used: Left Treatment Setting: 18 Feeling/ Response: Sensory Comments: Pt consent form signed and scanned into charge.  Preformed QI:[id number] Aashritha Miedema, CMA  Follow Up: As scheduled

## 2017-10-02 ENCOUNTER — Ambulatory Visit (INDEPENDENT_AMBULATORY_CARE_PROVIDER_SITE_OTHER): Payer: Medicare Other

## 2017-10-02 DIAGNOSIS — N3946 Mixed incontinence: Secondary | ICD-10-CM | POA: Diagnosis not present

## 2017-10-02 NOTE — Progress Notes (Signed)
PTNS  Session # 2  Health & Social Factors: same Caffeine: 1 Alcohol: 0 Daytime voids #per day: 5 Night-time voids #per night: 0 Urgency: none Incontinence Episodes #per day: 5 Ankle used: right Treatment Setting: 7 Feeling/ Response: sensory Comments: none  Preformed By: Fonnie Jarvis, CMA   Follow Up: 1 wk

## 2017-10-03 ENCOUNTER — Ambulatory Visit (HOSPITAL_COMMUNITY): Payer: Medicare Other | Admitting: Nurse Practitioner

## 2017-10-09 ENCOUNTER — Ambulatory Visit: Payer: Medicare Other

## 2017-10-10 ENCOUNTER — Ambulatory Visit (HOSPITAL_COMMUNITY)
Admission: RE | Admit: 2017-10-10 | Discharge: 2017-10-10 | Disposition: A | Discharge status: Home / Self Care | Payer: Medicare Other | Source: Ambulatory Visit | Attending: Nurse Practitioner | Admitting: Nurse Practitioner

## 2017-10-10 ENCOUNTER — Encounter (HOSPITAL_COMMUNITY): Payer: Self-pay | Admitting: Nurse Practitioner

## 2017-10-10 VITALS — BP 144/82 | HR 54 | Ht 64.0 in | Wt 208.4 lb

## 2017-10-10 DIAGNOSIS — E785 Hyperlipidemia, unspecified: Secondary | ICD-10-CM | POA: Insufficient documentation

## 2017-10-10 DIAGNOSIS — R001 Bradycardia, unspecified: Secondary | ICD-10-CM | POA: Diagnosis not present

## 2017-10-10 DIAGNOSIS — I10 Essential (primary) hypertension: Secondary | ICD-10-CM | POA: Diagnosis not present

## 2017-10-10 DIAGNOSIS — Z87891 Personal history of nicotine dependence: Secondary | ICD-10-CM | POA: Insufficient documentation

## 2017-10-10 DIAGNOSIS — Z7982 Long term (current) use of aspirin: Secondary | ICD-10-CM | POA: Diagnosis not present

## 2017-10-10 DIAGNOSIS — G8929 Other chronic pain: Secondary | ICD-10-CM | POA: Insufficient documentation

## 2017-10-10 DIAGNOSIS — I440 Atrioventricular block, first degree: Secondary | ICD-10-CM | POA: Insufficient documentation

## 2017-10-10 DIAGNOSIS — Z955 Presence of coronary angioplasty implant and graft: Secondary | ICD-10-CM | POA: Diagnosis not present

## 2017-10-10 DIAGNOSIS — Z79899 Other long term (current) drug therapy: Secondary | ICD-10-CM | POA: Insufficient documentation

## 2017-10-10 DIAGNOSIS — I252 Old myocardial infarction: Secondary | ICD-10-CM | POA: Insufficient documentation

## 2017-10-10 DIAGNOSIS — I447 Left bundle-branch block, unspecified: Secondary | ICD-10-CM | POA: Insufficient documentation

## 2017-10-10 DIAGNOSIS — I4819 Other persistent atrial fibrillation: Secondary | ICD-10-CM

## 2017-10-10 DIAGNOSIS — I481 Persistent atrial fibrillation: Secondary | ICD-10-CM | POA: Diagnosis not present

## 2017-10-10 DIAGNOSIS — Z9221 Personal history of antineoplastic chemotherapy: Secondary | ICD-10-CM | POA: Diagnosis not present

## 2017-10-10 DIAGNOSIS — I2510 Atherosclerotic heart disease of native coronary artery without angina pectoris: Secondary | ICD-10-CM | POA: Diagnosis not present

## 2017-10-10 DIAGNOSIS — Z923 Personal history of irradiation: Secondary | ICD-10-CM | POA: Insufficient documentation

## 2017-10-10 DIAGNOSIS — M549 Dorsalgia, unspecified: Secondary | ICD-10-CM | POA: Diagnosis not present

## 2017-10-10 DIAGNOSIS — Z8542 Personal history of malignant neoplasm of other parts of uterus: Secondary | ICD-10-CM | POA: Diagnosis not present

## 2017-10-10 DIAGNOSIS — Z880 Allergy status to penicillin: Secondary | ICD-10-CM | POA: Diagnosis not present

## 2017-10-11 ENCOUNTER — Ambulatory Visit (INDEPENDENT_AMBULATORY_CARE_PROVIDER_SITE_OTHER): Payer: Medicare Other

## 2017-10-11 DIAGNOSIS — N3946 Mixed incontinence: Secondary | ICD-10-CM

## 2017-10-11 NOTE — Progress Notes (Signed)
PTNS  Session # 3  Health & Social Factors: no change Caffeine: 1 Alcohol: 0 Daytime voids #per day: 5 Night-time voids #per night: 0 Urgency: none Incontinence Episodes #per day: 5- patient states that the amount of urine during leakage with standing is less Ankle used: right Treatment Setting: 19 Feeling/ Response: sensory Comments: none  Preformed By: Fonnie Jarvis, CMA   Follow Up: 1 wk

## 2017-10-11 NOTE — Progress Notes (Signed)
Primary Care Physician: Susan Pitch, MD Referring Physician: Ellouise [name] EP: Susan [name] is a 83 y.o. female with a h/o CAD,prior MI,HTN, neuropathy rom prior CA treatment, bradycardia, persistent afib that is in the afib clinic for evaluation. She was seen by Dr. Curt [name] for profound brady that was seen on holter monitor that led to hospitalization in October 2018.   At that time, the monitor showed no AF,but there were periods of Mobitz 1 block, rates in the 20s with pauses and junctional escape beats.  Her telemetry on that particular day was showing sinus bradycardia in the 40s-50s. No afib appreciated.  The patient admitted that she was entirely asymptomatic.  At that point the decision was made not to place a permanent pacemaker. SHe was not on AV nodal agents.  She subsequently saw Susan [name] in office follow-up in January 2019.  When she was seen by Dr. Claiborne [name] in March 2019, she was noted to be in afib at 67 bpm, LBBB. HR variable from low 40's to to 80's. She underwent cardioversion in April, set up by Dr. Curt [name], unfortunately, had ERAF.   She is being seen in  the afib clinic,on referral of Dr. Claiborne [name], as she had afib on f/u cardioversion with him. Ekg shows afib with slow ventricular response, at 54 bpm, LBBB. She states that she is asymptomatic  She has profound neuropathy from prior chemotherapy and chronic back issues that limit her activities the most, walks with a walker. She leads water aerobics several times a week and has not noted any limitations in her activities. She did not note any difference in the way she felt for the week or so she stayed in [name]. Of note, her husband just received a PPM at [name] last week.  Today, she denies symptoms of palpitations, chest pain, shortness of breath, orthopnea, PND, lower extremity edema, dizziness, presyncope, syncope, or neurologic sequela. The patient is tolerating medications without difficulties and is otherwise  without complaint today.   Past Medical History:  Diagnosis Date  . Bradycardia   . Chronic back pain   . Coronary artery disease    a. s/p STEMI in 2014 with DES to LAD with staged PCI/DES placement to LCx  . Echoencephalogram abnormality   . Heart murmur   . History of Holter monitoring   . Hyperlipidemia   . Hypertension   . Myocardial infarction ([place])   . Neuromuscular disorder (HCC)    neuropathy  . NSVT (nonsustained ventricular tachycardia) ([place])   . Personal history of chemotherapy    ENDOMETRIAL CA  . Personal history of radiation therapy    ENDOMETRIAL CA  . Uterine cancer ([place])    serrous   Past Surgical History:  Procedure Laterality Date  . ABDOMINAL HYSTERECTOMY    . BREAST EXCISIONAL BIOPSY Right 1956   NEG  . CARDIOVERSION N/A 07/27/2017   Procedure: CARDIOVERSION;  Surgeon: Susan Latch, MD;  Location: [facility];  Service: Cardiovascular;  Laterality: N/A;  . LEFT HEART CATHETERIZATION WITH CORONARY ANGIOGRAM  10/19/2012   Procedure: LEFT HEART CATHETERIZATION WITH CORONARY ANGIOGRAM;  Surgeon: Susan Sine, MD;  Location: [facility] CATH LAB;  Service: Cardiovascular;;  . PERCUTANEOUS CORONARY STENT INTERVENTION (PCI-S)  10/19/2012   Procedure: PERCUTANEOUS CORONARY STENT INTERVENTION (PCI-S);  Surgeon: Susan Sine, MD;  Location: [name] C[facility];  Service: Cardiovascular;;  . PERCUTANEOUS CORONARY STENT INTERVENTION (PCI-S) N/A 10/23/2012   Procedure: PERCUTANEOUS CORONARY STENT INTERVENTION (PCI-S);  Surgeon: Susan Sine,  MD;  Location: [facility] CATH LAB;  Service: Cardiovascular;  Laterality: N/A;  . PERIPHERAL VASCULAR CATHETERIZATION N/A 05/05/2015   Procedure: Susan [name] Cath Insertion;  Surgeon: Susan Huxley, MD;  Location: [facility] CV LAB;  Service: Cardiovascular;  Laterality: N/A;  . PORTA CATH REMOVAL N/A 04/05/2017   Procedure: PORTA CATH REMOVAL;  Surgeon: Susan Huxley, MD;  Location: [facility] CV LAB;  Service: Cardiovascular;  Laterality: N/A;     Current Outpatient Medications  Medication Sig Dispense Refill  . acetaminophen (TYLENOL) 650 MG CR tablet Take 650 mg by mouth every 8 (eight) hours as needed for pain.    [name] apixaban (ELIQUIS) 5 MG TABS tablet Take 1 tablet (5 mg total) by mouth 2 (two) times daily. 60 tablet 6  . aspirin EC 81 MG tablet Take 81 mg by mouth daily.    [name] atorvastatin (LIPITOR) 80 MG tablet TAKE 1 TABLET (80 MG TOTAL) BY MOUTH DAILY AT 6 PM. 90 tablet 1  . Calcium-Magnesium-Vitamin D (CALCIUM 1200+D3 PO) Take 1 tablet by mouth 2 (two) times daily.     [name] ezetimibe (ZETIA) 10 MG tablet Take 1 tablet (10 mg total) by mouth daily. 30 tablet 0  . fluticasone (FLONASE) 50 MCG/ACT nasal spray SPRAY 2 SPRAYS INTO EACH NOSTRIL EVERY DAY    . furosemide (LASIX) 20 MG tablet Take 1 tablet (20 mg total) by mouth daily. 90 tablet 3  . isosorbide mononitrate (IMDUR) 30 MG 24 hr tablet TAKE 1 TABLET (30 MG TOTAL) BY MOUTH DAILY. 90 tablet 1  . loperamide (IMODIUM A-D) 2 MG tablet Take 2 mg by mouth 4 (four) times daily as needed for diarrhea or loose stools.    [name] losartan (COZAAR) 50 MG tablet TAKE 1 TABLET BY MOUTH EVERY DAY (Patient taking differently: TAKE 1 TABLET BY MOUTH EVERY DAY AT NIGHT.) 90 tablet 3  . Multiple Vitamin (MULTIVITAMIN WITH MINERALS) TABS Take 1 tablet by mouth daily.    [name] omeprazole (PRILOSEC) 20 MG capsule Take 20 mg by mouth daily.    . vitamin B-12 (CYANOCOBALAMIN) 1000 MCG tablet Take 1,000 mcg by mouth daily.    [name] NITROSTAT 0.4 MG SL tablet PLACE 1 TABLET UNDER THE TOUGUE EVERY 5 MINUTES FOR UP TO 3 DOSES AS NEEDED FOR CHEST PAIN 25 tablet 0   No current facility-administered medications for this encounter.    Facility-Administered Medications Ordered in Other Encounters  Medication Dose Route Frequency Provider Last Rate Last Dose  . sodium chloride flush (NS) 0.9 % injection 10 mL  10 mL Intravenous PRN Susan Sickle, MD   10 mL at 06/06/16 1447  . sodium chloride flush (NS) 0.9 %  injection 10 mL  10 mL Intravenous PRN Susan Sickle, MD   10 mL at 08/15/16 1404    Allergies  Allergen Reactions  . Ace Inhibitors Cough  . Penicillins Rash and Other (See Comments)    Has patient had a PCN reaction causing immediate rash, facial/tongue/throat swelling, SOB or lightheadedness with hypotension: No Has patient had a PCN reaction causing severe rash involving mucus membranes or skin necrosis: No Has patient had a PCN reaction that required hospitalization: No Has patient had a PCN reaction occurring within the last 10 years: No If all of the above answers are "NO", then may proceed with Cephalosporin use.     Social History   Socioeconomic History  . Marital status: Married    Spouse name: Not on file  . Number of  children: Not on file  . Years of education: Not on file  . Highest education level: Not on file  Occupational History  . Not on file  Social Needs  . Financial resource strain: Not on file  . Food insecurity:    Worry: Not on file    Inability: Not on file  . Transportation needs:    Medical: Not on file    Non-medical: Not on file  Tobacco Use  . Smoking status: Former Smoker    Last attempt to quit: 1976    Years since quitting: 43.5  . Smokeless tobacco: Never Used  Substance and Sexual Activity  . Alcohol use: Yes    Alcohol/week: 0.6 oz    Types: 1 Glasses of wine per week  . Drug use: No  . Sexual activity: Yes  Lifestyle  . Physical activity:    Days per week: Not on file    Minutes per session: Not on file  . Stress: Not on file  Relationships  . Social connections:    Talks on phone: Not on file    Gets together: Not on file    Attends religious service: Not on file    Active member of club or organization: Not on file    Attends meetings of clubs or organizations: Not on file    Relationship status: Not on file  . Intimate partner violence:    Fear of current or ex partner: Not on file    Emotionally abused: Not  on file    Physically abused: Not on file    Forced sexual activity: Not on file  Other Topics Concern  . Not on file  Social History Narrative  . Not on file    Family History  Problem Relation Age of Onset  . Heart disease Mother   . Breast cancer Mother 43  . Heart disease Father   . Diabetes Father   . Diabetes Brother   . Stroke Brother   . Kidney cancer Neg Hx   . Bladder Cancer Neg Hx     ROS- All systems are reviewed and negative except as per the HPI above  Physical Exam: Vitals:   10/10/17 1040  BP: (!) 144/82  Pulse: (!) 54  Weight: 208 lb 6.4 oz (94.5 kg)  Height: 5\' 4"  (1.626 m)   Wt Readings from Last 3 Encounters:  10/10/17 208 lb 6.4 oz (94.5 kg)  09/13/17 209 lb 2 oz (94.9 kg)  09/03/17 208 lb (94.3 kg)    Labs: Lab Results  Component Value Date   NA 141 09/13/2017   K 3.8 09/13/2017   CL 110 09/13/2017   CO2 24 09/13/2017   GLUCOSE 114 (H) 09/13/2017   BUN 19 09/13/2017   CREATININE 0.90 09/13/2017   CALCIUM 9.6 09/13/2017   MG 2.2 07/14/2015   Lab Results  Component Value Date   INR 1.0 12/17/2013   Lab Results  Component Value Date   CHOL 116 06/06/2017   HDL 51 06/06/2017   LDLCALC 50 06/06/2017   TRIG 75 06/06/2017     GEN- The patient is well appearing, alert and oriented x 3 today.   Head- normocephalic, atraumatic Eyes-  Sclera clear, conjunctiva pink Ears- hearing intact Oropharynx- clear Neck- supple, no JVP Lymph- no cervical lymphadenopathy Lungs- Clear to ausculation bilaterally, normal work of breathing Heart- irregular rate and rhythm, no murmurs, rubs or gallops, PMI not laterally displaced GI- soft, NT, ND, + BS Extremities- no clubbing, cyanosis,  or edema MS- no significant deformity or atrophy Skin- no rash or lesion Psych- euthymic mood, full affect Neuro- strength and sensation are intact  EKG- afib with slow v response, v rate at 54 bpm, qrs int 139 ms, qtc 409 ms Study Conclusions  - Procedure  narrative: Transthoracic echocardiography. Image   quality was adequate. Intravenous contrast (Definity) was   administered. - Left ventricle: Septal and apical akinesis No mural apical   thrombus using definity. The cavity size was moderately dilated.   Wall thickness was increased in a pattern of moderate LVH.   Systolic function was mildly to moderately reduced. The estimated   ejection fraction was in the range of 40% to 45%. The study is   not technically sufficient to allow evaluation of LV diastolic   function. - Aortic valve: There was mild stenosis. Valve area (VTI): 2.1   cm^2. Valve area (Vmax): 1.61 cm^2. Valve area (Vmean): 1.82   cm^2. - Left atrium: The atrium was moderately dilated. - Atrial septum: No defect or patent foramen ovale was identified. - Pulmonary arteries: PA peak pressure: 36 mm Hg (S).     Assessment and Plan: 1. Persistent afib Failed cardioversion in May States that she is totally asymptomatic and did not appreciate any improvement for the short time she was back in [place]. I have concerns using AV nodal agents or antiarrythmic's, especially amiodarone for fear of exacerbating bradycardia or previous conduction issues, unless PPM is in place  Also, she has significant neuropathy form previous chemotherapy and would hate to worsen this with side effects of amiodarone Continue eliquis 5 mg bid for CHA2DS2VASc score of at least 4  Therefore, she will continue in rate controlled afib and f/u with with Dr. Curt [name] for further eval on 8/7, as scheduled.  Susan [name], [place] [name] [address] [place], [place] [address] [phone]

## 2017-10-16 ENCOUNTER — Ambulatory Visit (INDEPENDENT_AMBULATORY_CARE_PROVIDER_SITE_OTHER): Payer: Medicare Other | Admitting: Family Medicine

## 2017-10-16 DIAGNOSIS — N3946 Mixed incontinence: Secondary | ICD-10-CM

## 2017-10-16 NOTE — Progress Notes (Signed)
PTNS  Session # 4  Health & Social Factors: No change Caffeine: 1 Alcohol: 0 Daytime voids #per day: 5 Night-time voids #per night: 0 Urgency: None Incontinence Episodes #per day: 5 Ankle used: left Treatment Setting: 15 Feeling/ Response: Sensory Comments: Patient tolerated well.  Preformed By: Elberta Leatherwood, CMA  Follow Up: 1 week

## 2017-10-23 ENCOUNTER — Ambulatory Visit: Payer: Medicare Other | Admitting: Family Medicine

## 2017-10-23 DIAGNOSIS — N3946 Mixed incontinence: Secondary | ICD-10-CM

## 2017-10-23 NOTE — Progress Notes (Signed)
PTNS  Session # 5  Health & Social Factors: no change Caffeine: 1 Alcohol: 0 Daytime voids #per day: 5 Night-time voids #per night: 0 Urgency:none Incontinence Episodes #per day: 5 Ankle used: left Treatment Setting: 18 Feeling/ Response: sensory Comments: Patient tolerated well.  Preformed By: Elberta Leatherwood, CMA  Follow Up: 1 week

## 2017-10-30 ENCOUNTER — Ambulatory Visit (INDEPENDENT_AMBULATORY_CARE_PROVIDER_SITE_OTHER): Payer: Medicare Other

## 2017-10-30 DIAGNOSIS — N3946 Mixed incontinence: Secondary | ICD-10-CM

## 2017-10-30 NOTE — Progress Notes (Signed)
PTNS  Session # 6 Health & Social Factors: No Change Caffeine: 0 Alcohol: 0 Daytime voids #per day: 5 Night-time voids #per night: 0 Urgency: None Incontinence Episodes #per day: 5 Ankle used: Right Treatment Setting: 19 Feeling/ Response: Sensory   Preformed By: Cristie Hem, CMA   Follow Up: As Scheduled

## 2017-10-31 ENCOUNTER — Encounter: Payer: Self-pay | Admitting: Cardiology

## 2017-10-31 ENCOUNTER — Ambulatory Visit: Payer: Medicare Other | Admitting: Cardiology

## 2017-10-31 VITALS — BP 120/78 | HR 51 | Ht 64.0 in | Wt 211.0 lb

## 2017-10-31 DIAGNOSIS — I4819 Other persistent atrial fibrillation: Secondary | ICD-10-CM

## 2017-10-31 DIAGNOSIS — I495 Sick sinus syndrome: Secondary | ICD-10-CM

## 2017-10-31 DIAGNOSIS — I481 Persistent atrial fibrillation: Secondary | ICD-10-CM

## 2017-10-31 DIAGNOSIS — I2510 Atherosclerotic heart disease of native coronary artery without angina pectoris: Secondary | ICD-10-CM

## 2017-10-31 NOTE — Progress Notes (Signed)
Electrophysiology Office Note   Date:  10/31/2017   ID:  Susan [name], DOB 09-06-1934, MRN [id number]  PCP:  [name], MD  Cardiologist:  Shelva Majestic Primary Electrophysiologist:  Constance Haw, MD    No chief complaint on file.    History of Present Illness: Susan [name] is a 83 y.o. female who presents today for electrophysiology evaluation.   History of coronary artery disease. In July 2014, she presented to [facility] with an anterior wall STEMI and underwent emergent cardiac catheterization where her LAD was found to be totally occluded. She underwent stenting of her LAD eInitially she had significant wall motion abnormality involving the LAD territory. She also was found to have a 95% mid AV groove circumflex stenosis, on 10/23/2012 she underwent successful staged intervention with insertion with a DES stent into the circumflex vessel. She wore a cardiac monitor that showed heart rates as low as 20 but with sinus bradycardia in the 40s-50s and up to 2-3-second pauses.  Her heart rates were in the 60s-80s with activity.  She was sent to the emergency room, but did not get a pacemaker due to lack of symptoms.  Today, denies symptoms of palpitations, chest pain, shortness of breath, orthopnea, PND, lower extremity edema, claudication, dizziness, presyncope, syncope, bleeding, or neurologic sequela. The patient is tolerating medications without difficulties.  He is overall feeling well.  She has not had anything in the way of weakness, fatigue, or shortness of breath.  She has noted no evidence of bradycardia.  She is planning in the next few weeks to go to [place] with her husband for their 60th wedding anniversary.   Past Medical History:  Diagnosis Date  . Bradycardia   . Chronic back pain   . Coronary artery disease    a. s/p STEMI in 2014 with DES to LAD with staged PCI/DES placement to LCx  . Echoencephalogram abnormality   . Heart murmur   .  History of Holter monitoring   . Hyperlipidemia   . Hypertension   . Myocardial infarction ([place])   . Neuromuscular disorder (HCC)    neuropathy  . NSVT (nonsustained ventricular tachycardia) ([place])   . Personal history of chemotherapy    ENDOMETRIAL CA  . Personal history of radiation therapy    ENDOMETRIAL CA  . Uterine cancer ([place])    serrous   Past Surgical History:  Procedure Laterality Date  . ABDOMINAL HYSTERECTOMY    . BREAST EXCISIONAL BIOPSY Right 1956   NEG  . CARDIOVERSION N/A 07/27/2017   Procedure: CARDIOVERSION;  Surgeon: Skeet Latch, MD;  Location: [facility];  Service: Cardiovascular;  Laterality: N/A;  . LEFT HEART CATHETERIZATION WITH CORONARY ANGIOGRAM  10/19/2012   Procedure: LEFT HEART CATHETERIZATION WITH CORONARY ANGIOGRAM;  Surgeon: Troy Sine, MD;  Location: [facility] CATH LAB;  Service: Cardiovascular;;  . PERCUTANEOUS CORONARY STENT INTERVENTION (PCI-S)  10/19/2012   Procedure: PERCUTANEOUS CORONARY STENT INTERVENTION (PCI-S);  Surgeon: Troy Sine, MD;  Location: [facility] CATH LAB;  Service: Cardiovascular;;  . PERCUTANEOUS CORONARY STENT INTERVENTION (PCI-S) N/A 10/23/2012   Procedure: PERCUTANEOUS CORONARY STENT INTERVENTION (PCI-S);  Surgeon: Troy Sine, MD;  Location: [facility] CATH LAB;  Service: Cardiovascular;  Laterality: N/A;  . PERIPHERAL VASCULAR CATHETERIZATION N/A 05/05/2015   Procedure: Glori Luis Cath Insertion;  Surgeon: Algernon Huxley, MD;  Location: [facility] CV LAB;  Service: Cardiovascular;  Laterality: N/A;  . PORTA CATH REMOVAL N/A 04/05/2017   Procedure: PORTA CATH REMOVAL;  Surgeon:  Algernon Huxley, MD;  Location: [facility] CV LAB;  Service: Cardiovascular;  Laterality: N/A;     Current Outpatient Medications  Medication Sig Dispense Refill  . acetaminophen (TYLENOL) 650 MG CR tablet Take 650 mg by mouth every 8 (eight) hours as needed for pain.    [name] apixaban (ELIQUIS) 5 MG TABS tablet Take 1 tablet (5 mg total) by mouth 2 (two) times daily.  60 tablet 6  . aspirin EC 81 MG tablet Take 81 mg by mouth daily.    [name] atorvastatin (LIPITOR) 80 MG tablet TAKE 1 TABLET (80 MG TOTAL) BY MOUTH DAILY AT 6 PM. 90 tablet 1  . Calcium-Magnesium-Vitamin D (CALCIUM 1200+D3 PO) Take 1 tablet by mouth daily.     [name] ezetimibe (ZETIA) 10 MG tablet Take 1 tablet (10 mg total) by mouth daily. 30 tablet 0  . fluticasone (FLONASE) 50 MCG/ACT nasal spray SPRAY 2 SPRAYS INTO EACH NOSTRIL EVERY DAY    . furosemide (LASIX) 20 MG tablet Take 20 mg by mouth daily as needed for fluid.    . isosorbide mononitrate (IMDUR) 30 MG 24 hr tablet TAKE 1 TABLET (30 MG TOTAL) BY MOUTH DAILY. 90 tablet 1  . loperamide (IMODIUM A-D) 2 MG tablet Take 2 mg by mouth 4 (four) times daily as needed for diarrhea or loose stools.    [name] losartan (COZAAR) 50 MG tablet TAKE 1 TABLET BY MOUTH EVERY DAY 90 tablet 3  . Multiple Vitamin (MULTIVITAMIN WITH MINERALS) TABS Take 1 tablet by mouth daily.    [name] NITROSTAT 0.4 MG SL tablet PLACE 1 TABLET UNDER THE TOUGUE EVERY 5 MINUTES FOR UP TO 3 DOSES AS NEEDED FOR CHEST PAIN 25 tablet 0  . omeprazole (PRILOSEC) 20 MG capsule Take 20 mg by mouth daily.    . vitamin B-12 (CYANOCOBALAMIN) 1000 MCG tablet Take 1,000 mcg by mouth daily.     No current facility-administered medications for this visit.    Facility-Administered Medications Ordered in Other Visits  Medication Dose Route Frequency Provider Last Rate Last Dose  . sodium chloride flush (NS) 0.9 % injection 10 mL  10 mL Intravenous PRN Cammie Sickle, MD   10 mL at 06/06/16 1447  . sodium chloride flush (NS) 0.9 % injection 10 mL  10 mL Intravenous PRN Cammie Sickle, MD   10 mL at 08/15/16 1404    Allergies:   Ace inhibitors and Penicillins   Social History:  The patient  reports that she quit smoking about 43 years ago. She has never used smokeless tobacco. She reports that she drinks about 0.6 oz of alcohol per week. She reports that she does not use drugs.   Family  History:  The patient's family history includes Breast cancer (age of onset: 79) in her mother; Diabetes in her brother and father; Heart disease in her father and mother; Stroke in her brother.   ROS:  Please see the history of present illness.   Otherwise, review of systems is positive for neck pain, balance problems.   All other systems are reviewed and negative.   PHYSICAL EXAM: VS:  BP 120/78   Pulse (!) 51   Ht 5\' 4"  (1.626 m)   Wt 211 lb (95.7 kg)   SpO2 95%   BMI 36.22 kg/m  , BMI Body mass index is 36.22 kg/m. GEN: Well nourished, well developed, in no acute distress  HEENT: normal  Neck: no JVD, carotid bruits, or masses Cardiac: iRRR; no murmurs, rubs,  or gallops,no edema  Respiratory:  clear to auscultation bilaterally, normal work of breathing GI: soft, nontender, nondistended, + BS MS: no deformity or atrophy  Skin: warm and dry Neuro:  Strength and sensation are intact Psych: euthymic mood, full affect  EKG:  EKG is not ordered today. Personal review of the ekg ordered 10/10/17 shows atrial fibrillation, left bundle branch block, rate 54  Recent Labs: 09/13/2017: ALT 16; BUN 19; Creatinine, Ser 0.90; Hemoglobin 12.3; Platelets 217; Potassium 3.8; Sodium 141    Lipid Panel     Component Value Date/Time   CHOL 116 06/06/2017 0922   CHOL 141 04/16/2013 0924   TRIG 75 06/06/2017 0922   TRIG 112 04/16/2013 0924   HDL 51 06/06/2017 0922   HDL 53 04/16/2013 0924   CHOLHDL 2.3 06/06/2017 0922   CHOLHDL 2.2 06/19/2016 0950   VLDL 20 06/19/2016 0950   LDLCALC 50 06/06/2017 0922   LDLCALC 66 04/16/2013 0924     Wt Readings from Last 3 Encounters:  10/31/17 211 lb (95.7 kg)  10/10/17 208 lb 6.4 oz (94.5 kg)  09/13/17 209 lb 2 oz (94.9 kg)      Other studies Reviewed: Additional studies/ records that were reviewed today include: TTE 01/04/17 Review of the above records today demonstrates:  - Left ventricle: The cavity size was normal. There was moderate    concentric hypertrophy. Systolic function was mildly to   moderately reduced. The estimated ejection fraction was in the   range of 40% to 45%. Severe hypokinesis of the inferior,   inferoseptal, and apical myocardium. Mild anteroseptal and   inferolateral hypokinesis. Doppler parameters are consistent with   abnormal left ventricular relaxation (grade 1 diastolic   dysfunction). Doppler parameters are consistent with high   ventricular filling pressure. - Aortic valve: There was mild stenosis. Peak velocity (S): 238   cm/s. Mean gradient (S): 12 mm Hg. Valve area (VTI): 2.7 cm^2.   Valve area (Vmax): 2.18 cm^2. Valve area (Vmean): 2.39 cm^2. - Mitral valve: Calcified annulus. Severe thickening and   calcification. Transvalvular velocity was within the normal   range. There was no evidence for stenosis. There was mild   regurgitation. Valve area by continuity equation (using LVOT   flow): 3.08 cm^2. - Left atrium: The atrium was severely dilated. - Right ventricle: The cavity size was normal. Wall thickness was   normal. Systolic function was normal. - Tricuspid valve: There was no regurgitation. - Pulmonary arteries: Systolic pressure was within the normal   range. PA peak pressure: 32 mm Hg (S). - Pericardium, extracardiac: A trivial pericardial effusion was   identified.  Holter 01/17/17 - personally reviewed Markedly abnormal monitor demonstrating predominantly underlying atrial fibrillation with slow ventricular rate.  The average heart beat was 53 bpm.  The maximum heart rate was 109 bpm.  During the monitoring period, the patient had significant bradycardia with heart rates down in the mid to low 30s.  Th there were 2 episodes with heart rates below 30 at 25 bpm  and at 19 bpm immediately thereafter at 9:57 PM on 01/10/2017.  On 01/11/2017 at 7:47 AM heart rate was 24 bpm.   ASSESSMENT AND PLAN:  1.  Sick sinus syndrome: Heart rates currently are in the 50s and atrial  fibrillation.  She is minimally symptomatic and thus no plans for return to sinus rhythm.  She is shown no evidence of bradycardia and has not had symptoms.  Hold off on pacemaker at this time.  2.  Coronary artery disease: Asymptomatic.  No changes at this time.  3.  Persistent atrial fibrillation: Has been in atrial fibrillation for some time.  She has converted back to atrial fibrillation and thus Susan [name] likely be be a rate control strategy.  This patients CHA2DS2-VASc Score and unadjusted Ischemic Stroke Rate (% per year) is equal to 4.8 % stroke rate/year from a score of 4  Above score calculated as 1 point each if present [CHF, HTN, DM, Vascular=MI/PAD/Aortic Plaque, Age if 65-74, or Female] Above score calculated as 2 points each if present [Age > 75, or Stroke/TIA/TE]   Current medicines are reviewed at length with the patient today.   The patient does not have concerns regarding her medicines.  The following changes were made today: none  Labs/ tests ordered today include: none  No orders of the defined types were placed in this encounter.    Disposition:   FU with Susan [name] 6 months  Signed, Susan Maharaj Meredith Leeds, MD  10/31/2017 10:29 AM     [facility] [address] [place] [address] [phone] (office) [phone] (fax)

## 2017-10-31 NOTE — Patient Instructions (Signed)
Medication Instructions:  Your physician recommends that you continue on your current medications as directed. Please refer to the Current Medication list given to you today.  * If you need a refill on your cardiac medications before your next appointment, please call your pharmacy.   Labwork: None ordered  Testing/Procedures: None ordered  Follow-Up: Your physician wants you to follow-up in: 6 months with Dr. Camnitz.  You will receive a reminder letter in the mail two months in advance. If you don't receive a letter, please call our office to schedule the follow-up appointment.  *Please note that any paperwork needing to be filled out by the provider will need to be addressed at the front desk prior to seeing the provider. Please note that any FMLA, disability or other documents regarding health condition is subject to a $25.00 charge that must be received prior to completion of paperwork in the form of a money order or check.  Thank you for choosing [facility]!!   Dolores Mcgovern, RN [phone]        

## 2017-11-06 ENCOUNTER — Ambulatory Visit (INDEPENDENT_AMBULATORY_CARE_PROVIDER_SITE_OTHER): Payer: Medicare Other

## 2017-11-06 ENCOUNTER — Ambulatory Visit: Payer: Medicare Other

## 2017-11-06 DIAGNOSIS — N3946 Mixed incontinence: Secondary | ICD-10-CM

## 2017-11-06 NOTE — Progress Notes (Signed)
PTNS  Session #7  Health & Social Factors: No Change Caffeine: 1 Alcohol: 0 Daytime voids #per day: 5 Night-time voids #per night: 0 Urgency: None Incontinence Episodes #per day: 5 Ankle used: Left Treatment Setting: 10 Feeling/ Response: Sensory Comments:   Preformed By: Cristie Hem, CMA  Assistant:   Follow Up: As Scheduled

## 2017-11-07 ENCOUNTER — Ambulatory Visit: Payer: Medicare Other | Admitting: Radiation Oncology

## 2017-11-13 ENCOUNTER — Ambulatory Visit: Payer: Medicare Other

## 2017-11-20 ENCOUNTER — Ambulatory Visit (INDEPENDENT_AMBULATORY_CARE_PROVIDER_SITE_OTHER): Payer: Medicare Other | Admitting: Family Medicine

## 2017-11-20 DIAGNOSIS — N3946 Mixed incontinence: Secondary | ICD-10-CM | POA: Diagnosis not present

## 2017-11-20 NOTE — Progress Notes (Signed)
PTNS  Session # 8  Health & Social Factors: no change Caffeine: 1 Alcohol: 0 Daytime voids #per day: 5 Night-time voids #per night: 0 Urgency: none Incontinence Episodes #per day: 5 Ankle used: left Treatment Setting: 19 Feeling/ Response: sensory Comments: patient tolerated well  Preformed By: Jacinto Halim   Follow Up: 1 week

## 2017-11-21 ENCOUNTER — Other Ambulatory Visit: Payer: Self-pay | Admitting: *Deleted

## 2017-11-21 ENCOUNTER — Other Ambulatory Visit: Payer: Self-pay

## 2017-11-21 ENCOUNTER — Encounter: Payer: Self-pay | Admitting: Radiation Oncology

## 2017-11-21 ENCOUNTER — Ambulatory Visit
Admission: RE | Admit: 2017-11-21 | Discharge: 2017-11-21 | Disposition: A | Discharge status: Home / Self Care | Payer: Medicare Other | Source: Ambulatory Visit | Attending: Radiation Oncology | Admitting: Radiation Oncology

## 2017-11-21 DIAGNOSIS — Z923 Personal history of irradiation: Secondary | ICD-10-CM | POA: Insufficient documentation

## 2017-11-21 DIAGNOSIS — C541 Malignant neoplasm of endometrium: Secondary | ICD-10-CM

## 2017-11-21 DIAGNOSIS — R32 Unspecified urinary incontinence: Secondary | ICD-10-CM | POA: Insufficient documentation

## 2017-11-21 DIAGNOSIS — Z8542 Personal history of malignant neoplasm of other parts of uterus: Secondary | ICD-10-CM | POA: Insufficient documentation

## 2017-11-21 DIAGNOSIS — M549 Dorsalgia, unspecified: Secondary | ICD-10-CM | POA: Insufficient documentation

## 2017-11-21 NOTE — Progress Notes (Signed)
Radiation Oncology Follow up Note  Name: Susan [name]   Date:   11/21/2017 MRN:  [id number] DOB: July 24, 1934    This 83 y.o. female presents to the clinic today for to year follow-up status post adjuvant whole pelvic radiation therapy for stage IIIc uterine cancer.  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 She also 3 positive pelvic nodes. She seen today in routine follow-up she has been multiple somatic complaints. She's been treated by urology now for urinary incontinence withTINS. She's also having lower back pain has seen a chiropractor. She's not had any scanning or imaging for quite a while. She specifically denies diarrhea dysuria or any other GI/GU complaints except urinary incontinence which is being addressed..  COMPLICATIONS OF TREATMENT: none  FOLLOW UP COMPLIANCE: keeps appointments   PHYSICAL EXAM:  BP (P) 121/78 (BP Location: Left Arm, Patient Position: Sitting)   Pulse (!) (P) 59   Temp (!) (P) 97.5 F (36.4 C) (Tympanic)   Resp (P) 18   Wt (P) 206 lb 12.7 oz (93.8 kg)   BMI (P) 35.50 kg/m  On speculum examination vaginal vault is clear no evidence of stenosis. Bimanual examination shows no evidence of parametrial mass or nodularity.Well-developed well-nourished patient in NAD. HEENT reveals PERLA, EOMI, discs not visualized.  Oral cavity is clear. No oral mucosal lesions are identified. Neck is clear without evidence of cervical or supraclavicular adenopathy. Lungs are clear to A&P. Cardiac examination is essentially unremarkable with regular rate and rhythm without murmur rub or thrill. Abdomen is benign with no organomegaly or masses noted. Motor sensory and DTR levels are equal and symmetric in the upper and lower  extremities. Cranial nerves II through XII are grossly intact. Proprioception is intact. No peripheral adenopathy or edema is identified. No motor or sensory levels are noted. Crude visual fields are within normal range.  RADIOLOGY RESULTS: MRI of lumbar spine ordered CT scan of abdomen and pelvis with contrast ordered  PLAN: at this time of ordered MRI of her lumbar spine as well as CT scan with contrast of abdomen and pelvis. Certainly this would give [place] better clarity on her back pain as well as help [place] to determine whether evidence of any recurrent disease. I will see the patient shortly after the imaging is performed fourth follow-up. Patient is comfortable with my treatment plan.  I would like to take this opportunity to thank you for allowing me to participate in the care of your patient.Noreene Filbert, MD

## 2017-11-24 ENCOUNTER — Other Ambulatory Visit: Payer: Self-pay | Admitting: Cardiovascular Disease

## 2017-11-27 ENCOUNTER — Ambulatory Visit (INDEPENDENT_AMBULATORY_CARE_PROVIDER_SITE_OTHER): Payer: Medicare Other

## 2017-11-27 DIAGNOSIS — N3946 Mixed incontinence: Secondary | ICD-10-CM | POA: Diagnosis not present

## 2017-11-27 NOTE — Progress Notes (Signed)
PTNS  Session #9  Health & Social Factors: None Caffeine: 1  Alcohol: 0 Daytime voids #per day: 5 Night-time voids #per night: 0 Urgency: None Incontinence Episodes #per day: 3-4 Ankle used: RIGHT Treatment Setting: 19 Feeling/ Response: None (pt has significant neuropathy)  Comments: None  Preformed By: Sherril Cong, CMA (AAMA)  Follow Up: As scheduled

## 2017-12-04 ENCOUNTER — Ambulatory Visit (INDEPENDENT_AMBULATORY_CARE_PROVIDER_SITE_OTHER): Payer: Medicare Other

## 2017-12-04 ENCOUNTER — Ambulatory Visit: Payer: Medicare Other

## 2017-12-04 DIAGNOSIS — N3946 Mixed incontinence: Secondary | ICD-10-CM | POA: Diagnosis not present

## 2017-12-04 NOTE — Progress Notes (Signed)
PTNS  Session # 10  Health & Social Factors: No Change Caffeine: 1 Alcohol: 0 Daytime voids #per day: 5 Night-time voids #per night: 0 Urgency: None Incontinence Episodes #per day: 5 Ankle used: Left Treatment Setting: 19 Feeling/ Response: None (neuropathy)   Preformed By: Cristie Hem, CMA  Follow Up: As Scheduled

## 2017-12-11 ENCOUNTER — Ambulatory Visit: Payer: Medicare Other

## 2017-12-12 ENCOUNTER — Ambulatory Visit
Admission: RE | Admit: 2017-12-12 | Discharge: 2017-12-12 | Disposition: A | Discharge status: Home / Self Care | Payer: Medicare Other | Source: Ambulatory Visit | Attending: Radiation Oncology | Admitting: Radiation Oncology

## 2017-12-12 DIAGNOSIS — C541 Malignant neoplasm of endometrium: Secondary | ICD-10-CM | POA: Diagnosis not present

## 2017-12-12 DIAGNOSIS — Z90710 Acquired absence of both cervix and uterus: Secondary | ICD-10-CM | POA: Diagnosis not present

## 2017-12-12 MED ORDER — IOPAMIDOL (ISOVUE-300) INJECTION 61%
100.0000 mL | Freq: Once | INTRAVENOUS | Status: AC | PRN
  Administered 2017-12-12: 100 mL via INTRAVENOUS

## 2017-12-12 MED ORDER — GADOBENATE DIMEGLUMINE 529 MG/ML IV SOLN
20.0000 mL | Freq: Once | INTRAVENOUS | Status: AC | PRN
  Administered 2017-12-12: 19 mL via INTRAVENOUS

## 2017-12-17 ENCOUNTER — Other Ambulatory Visit: Payer: Self-pay

## 2017-12-17 ENCOUNTER — Encounter: Payer: Self-pay | Admitting: Radiation Oncology

## 2017-12-17 ENCOUNTER — Ambulatory Visit
Admission: RE | Admit: 2017-12-17 | Discharge: 2017-12-17 | Disposition: A | Discharge status: Home / Self Care | Payer: Medicare Other | Source: Ambulatory Visit | Attending: Radiation Oncology | Admitting: Radiation Oncology

## 2017-12-17 DIAGNOSIS — Z923 Personal history of irradiation: Secondary | ICD-10-CM | POA: Insufficient documentation

## 2017-12-17 DIAGNOSIS — M48061 Spinal stenosis, lumbar region without neurogenic claudication: Secondary | ICD-10-CM | POA: Insufficient documentation

## 2017-12-17 DIAGNOSIS — Z8542 Personal history of malignant neoplasm of other parts of uterus: Secondary | ICD-10-CM | POA: Diagnosis not present

## 2017-12-17 DIAGNOSIS — C541 Malignant neoplasm of endometrium: Secondary | ICD-10-CM

## 2017-12-17 NOTE — Progress Notes (Signed)
Radiation Oncology Follow up Note  Name: Susan [name]   Date:   12/17/2017 MRN:  [id number] DOB: [birth year]    This 83 y.o. female presents to the clinic today for follow-up and discussion of recent imaging secondary to her stage IIIc uterine carcinoma treated2 year prior.  REFERRING PROVIDER: [name], MD  HPI: patient is an 83 year old female.now out 2 years having completed both external beam radiation therapy as well as brachytherapy for stage IIIc uterine cancer. She was having lower back pain I saw her back in Augustand ordered MRI of the lumbar spine as well as a CT scan of the abdomen and pelvis. Fortunately lumbar spine MRI scan demonstrated significant stenosis at L4-5 and bilateral L5 neural impingement along the right L4 neural foramina. CT scan also demonstrate no evidence to suggest metastatic or progressive carcinoma.  COMPLICATIONS OF TREATMENT: none  FOLLOW UP COMPLIANCE: keeps appointments   PHYSICAL EXAM:  BP (P) 133/74 (BP Location: Right Arm, Patient Position: Sitting)   Pulse (P) 90   Temp (!) (P) 96.4 F (35.8 C) (Tympanic)   Wt (P) 212 lb 10.1 oz (96.5 kg)   BMI (P) 36.50 kg/m  Well-developed well-nourished patient in NAD. HEENT reveals PERLA, EOMI, discs not visualized.  Oral cavity is clear. No oral mucosal lesions are identified. Neck is clear without evidence of cervical or supraclavicular adenopathy. Lungs are clear to A&P. Cardiac examination is essentially unremarkable with regular rate and rhythm without murmur rub or thrill. Abdomen is benign with no organomegaly or masses noted. Motor sensory and DTR levels are equal and symmetric in the upper and lower extremities. Cranial nerves II through XII are grossly intact. Proprioception is intact. No peripheral adenopathy or edema is identified. No motor or sensory levels are noted. Crude visual fields are within normal range.  RADIOLOGY RESULTS: MRI scans and CT scans reviewed  PLAN: at this time  am referring her back to her neurosurgeon at the current nodal clinic Dr. Lacinda Axon for review of her MRI scans. I've expressed to her that she has no evidence to suggest progressive or metastatic disease. I have asked to see her back in 1 year for follow-up. Patient is to call at anytime with any concerns.  I would like to take this opportunity to thank you for allowing me to participate in the care of your patient.Noreene Filbert, MD

## 2017-12-18 ENCOUNTER — Ambulatory Visit: Payer: Medicare Other

## 2017-12-19 ENCOUNTER — Ambulatory Visit (INDEPENDENT_AMBULATORY_CARE_PROVIDER_SITE_OTHER): Payer: Medicare Other

## 2017-12-19 DIAGNOSIS — N3946 Mixed incontinence: Secondary | ICD-10-CM

## 2017-12-19 NOTE — Progress Notes (Signed)
PTNS  Session # 11  Health & Social Factors: No Change Caffeine: 1 Alcohol: 0 Daytime voids #per day: 5 Night-time voids #per night: 0 Urgency: None Incontinence Episodes #per day: 5 Ankle used: Right Treatment Setting: 17 Feeling/ Response: None, pt has neuropathy Comments: none  Preformed By: Cristie Hem, CMA  Follow Up: 1 week

## 2017-12-20 ENCOUNTER — Other Ambulatory Visit: Payer: Self-pay

## 2017-12-20 ENCOUNTER — Encounter: Payer: Self-pay | Admitting: Student in an Organized Health Care Education/Training Program

## 2017-12-20 ENCOUNTER — Ambulatory Visit
Payer: Medicare Other | Attending: Student in an Organized Health Care Education/Training Program | Admitting: Student in an Organized Health Care Education/Training Program

## 2017-12-20 VITALS — BP 129/70 | HR 44 | Temp 98.5°F | Resp 18 | Ht 64.0 in | Wt 209.0 lb

## 2017-12-20 DIAGNOSIS — Z803 Family history of malignant neoplasm of breast: Secondary | ICD-10-CM | POA: Insufficient documentation

## 2017-12-20 DIAGNOSIS — I252 Old myocardial infarction: Secondary | ICD-10-CM | POA: Insufficient documentation

## 2017-12-20 DIAGNOSIS — Z7982 Long term (current) use of aspirin: Secondary | ICD-10-CM | POA: Insufficient documentation

## 2017-12-20 DIAGNOSIS — Z833 Family history of diabetes mellitus: Secondary | ICD-10-CM | POA: Insufficient documentation

## 2017-12-20 DIAGNOSIS — I481 Persistent atrial fibrillation: Secondary | ICD-10-CM | POA: Diagnosis not present

## 2017-12-20 DIAGNOSIS — Z8542 Personal history of malignant neoplasm of other parts of uterus: Secondary | ICD-10-CM | POA: Insufficient documentation

## 2017-12-20 DIAGNOSIS — E669 Obesity, unspecified: Secondary | ICD-10-CM | POA: Insufficient documentation

## 2017-12-20 DIAGNOSIS — Z888 Allergy status to other drugs, medicaments and biological substances status: Secondary | ICD-10-CM | POA: Insufficient documentation

## 2017-12-20 DIAGNOSIS — I495 Sick sinus syndrome: Secondary | ICD-10-CM | POA: Diagnosis not present

## 2017-12-20 DIAGNOSIS — M8448XA Pathological fracture, other site, initial encounter for fracture: Secondary | ICD-10-CM | POA: Diagnosis not present

## 2017-12-20 DIAGNOSIS — Z9221 Personal history of antineoplastic chemotherapy: Secondary | ICD-10-CM | POA: Insufficient documentation

## 2017-12-20 DIAGNOSIS — Z923 Personal history of irradiation: Secondary | ICD-10-CM | POA: Insufficient documentation

## 2017-12-20 DIAGNOSIS — Z823 Family history of stroke: Secondary | ICD-10-CM | POA: Insufficient documentation

## 2017-12-20 DIAGNOSIS — Z955 Presence of coronary angioplasty implant and graft: Secondary | ICD-10-CM | POA: Insufficient documentation

## 2017-12-20 DIAGNOSIS — I2510 Atherosclerotic heart disease of native coronary artery without angina pectoris: Secondary | ICD-10-CM | POA: Insufficient documentation

## 2017-12-20 DIAGNOSIS — E785 Hyperlipidemia, unspecified: Secondary | ICD-10-CM | POA: Diagnosis not present

## 2017-12-20 DIAGNOSIS — Z85828 Personal history of other malignant neoplasm of skin: Secondary | ICD-10-CM | POA: Insufficient documentation

## 2017-12-20 DIAGNOSIS — M8580 Other specified disorders of bone density and structure, unspecified site: Secondary | ICD-10-CM | POA: Insufficient documentation

## 2017-12-20 DIAGNOSIS — Z79899 Other long term (current) drug therapy: Secondary | ICD-10-CM | POA: Diagnosis not present

## 2017-12-20 DIAGNOSIS — C801 Malignant (primary) neoplasm, unspecified: Secondary | ICD-10-CM | POA: Diagnosis not present

## 2017-12-20 DIAGNOSIS — Z90710 Acquired absence of both cervix and uterus: Secondary | ICD-10-CM | POA: Insufficient documentation

## 2017-12-20 DIAGNOSIS — Z8249 Family history of ischemic heart disease and other diseases of the circulatory system: Secondary | ICD-10-CM | POA: Insufficient documentation

## 2017-12-20 DIAGNOSIS — Z87891 Personal history of nicotine dependence: Secondary | ICD-10-CM | POA: Insufficient documentation

## 2017-12-20 DIAGNOSIS — Z6835 Body mass index (BMI) 35.0-35.9, adult: Secondary | ICD-10-CM | POA: Diagnosis not present

## 2017-12-20 DIAGNOSIS — Z87442 Personal history of urinary calculi: Secondary | ICD-10-CM | POA: Insufficient documentation

## 2017-12-20 DIAGNOSIS — Z880 Allergy status to penicillin: Secondary | ICD-10-CM | POA: Insufficient documentation

## 2017-12-20 DIAGNOSIS — M47816 Spondylosis without myelopathy or radiculopathy, lumbar region: Secondary | ICD-10-CM | POA: Insufficient documentation

## 2017-12-20 DIAGNOSIS — Z7951 Long term (current) use of inhaled steroids: Secondary | ICD-10-CM | POA: Diagnosis not present

## 2017-12-20 DIAGNOSIS — G63 Polyneuropathy in diseases classified elsewhere: Secondary | ICD-10-CM | POA: Diagnosis not present

## 2017-12-20 DIAGNOSIS — I10 Essential (primary) hypertension: Secondary | ICD-10-CM | POA: Insufficient documentation

## 2017-12-20 DIAGNOSIS — M545 Low back pain: Secondary | ICD-10-CM | POA: Diagnosis present

## 2017-12-20 MED ORDER — GABAPENTIN 300 MG PO CAPS: 300.0000 mg | ORAL_CAPSULE | Freq: Three times a day (TID) | ORAL | 2 refills | Status: DC

## 2017-12-20 NOTE — Patient Instructions (Addendum)
1.  We will get you scheduled for left-sided radiofrequency ablation.  We will also obtain cardiac clearance from your cardiologist to see if it is safe to stop your Eliquis 3 days prior to your scheduled procedure.  2.  I have also refilled your gabapentin.GENERAL RISKS AND COMPLICATIONS  What are the risk, side effects and possible complications? Generally speaking, most procedures are safe.  However, with any procedure there are risks, side effects, and the possibility of complications.  The risks and complications are dependent upon the sites that are lesioned, or the type of nerve block to be performed.  The closer the procedure is to the spine, the more serious the risks are.  Great care is taken when placing the radio frequency needles, block needles or lesioning probes, but sometimes complications can occur. 1. Infection: Any time there is an injection through the skin, there is a risk of infection.  This is why sterile conditions are used for these blocks.  There are four possible types of infection. 1. Localized skin infection. 2. Central Nervous System Infection-This can be in the form of Meningitis, which can be deadly. 3. Epidural Infections-This can be in the form of an epidural abscess, which can cause pressure inside of the spine, causing compression of the spinal cord with subsequent paralysis. This would require an emergency surgery to decompress, and there are no guarantees that the patient would recover from the paralysis. 4. Discitis-This is an infection of the intervertebral discs.  It occurs in about 1% of discography procedures.  It is difficult to treat and it may lead to surgery.        2. Pain: the needles have to go through skin and soft tissues, will cause soreness.       3. Damage to internal structures:  The nerves to be lesioned may be near blood vessels or    other nerves which can be potentially damaged.       4. Bleeding: Bleeding is more common if the patient is  taking blood thinners such as  aspirin, Coumadin, Ticiid, Plavix, etc., or if he/she have some genetic predisposition  such as hemophilia. Bleeding into the spinal canal can cause compression of the spinal  cord with subsequent paralysis.  This would require an emergency surgery to  decompress and there are no guarantees that the patient would recover from the  paralysis.       5. Pneumothorax:  Puncturing of a lung is a possibility, every time a needle is introduced in  the area of the chest or upper back.  Pneumothorax refers to free air around the  collapsed lung(s), inside of the thoracic cavity (chest cavity).  Another two possible  complications related to a similar event would include: Hemothorax and Chylothorax.   These are variations of the Pneumothorax, where instead of air around the collapsed  lung(s), you may have blood or chyle, respectively.       6. Spinal headaches: They may occur with any procedures in the area of the spine.       7. Persistent CSF (Cerebro-Spinal Fluid) leakage: This is a rare problem, but may occur  with prolonged intrathecal or epidural catheters either due to the formation of a fistulous  track or a dural tear.       8. Nerve damage: By working so close to the spinal cord, there is always a possibility of  nerve damage, which could be as serious as a permanent spinal cord injury with  paralysis.       9. Death:  Although rare, severe deadly allergic reactions known as "Anaphylactic  reaction" can occur to any of the medications used.      10. Worsening of the symptoms:  We can always make thing worse.  What are the chances of something like this happening? Chances of any of this occuring are extremely low.  By statistics, you have more of a chance of getting killed in a motor vehicle accident: while driving to the hospital than any of the above occurring .  Nevertheless, you should be aware that they are possibilities.  In general, it is similar to taking a shower.   Everybody knows that you can slip, hit your head and get killed.  Does that mean that you should not shower again?  Nevertheless always keep in mind that statistics do not mean anything if you happen to be on the wrong side of them.  Even if a procedure has a 1 (one) in a 1,000,000 (million) chance of going wrong, it you happen to be that one..Also, keep in mind that by statistics, you have more of a chance of having something go wrong when taking medications.  Who should not have this procedure? If you are on a blood thinning medication (e.g. Coumadin, Plavix, see list of "Blood Thinners"), or if you have an active infection going on, you should not have the procedure.  If you are taking any blood thinners, please inform your physician.  How should I prepare for this procedure?  Do not eat or drink anything at least six hours prior to the procedure.  Bring a driver with you .  It cannot be a taxi.  Come accompanied by an adult that can drive you back, and that is strong enough to help you if your legs get weak or numb from the local anesthetic.  Take all of your medicines the morning of the procedure with just enough water to swallow them.  If you have diabetes, make sure that you are scheduled to have your procedure done first thing in the morning, whenever possible.  If you have diabetes, take only half of your insulin dose and notify our nurse that you have done so as soon as you arrive at the clinic.  If you are diabetic, but only take blood sugar pills (oral hypoglycemic), then do not take them on the morning of your procedure.  You may take them after you have had the procedure.  Do not take aspirin or any aspirin-containing medications, at least eleven (11) days prior to the procedure.  They may prolong bleeding.  Wear loose fitting clothing that may be easy to take off and that you would not mind if it got stained with Betadine or blood.  Do not wear any jewelry or perfume  Remove  any nail coloring.  It will interfere with some of our monitoring equipment.  NOTE: Remember that this is not meant to be interpreted as a complete list of all possible complications.  Unforeseen problems may occur.  BLOOD THINNERS The following drugs contain aspirin or other products, which can cause increased bleeding during surgery and should not be taken for 2 weeks prior to and 1 week after surgery.  If you should need take something for relief of minor pain, you may take acetaminophen which is found in Tylenol,m Datril, Anacin-3 and Panadol. It is not blood thinner. The products listed below are.  Do not take any of the products listed  below in addition to any listed on your instruction sheet.  A.P.C or A.P.C with Codeine Codeine Phosphate Capsules #3 Ibuprofen Ridaura  ABC compound Congesprin Imuran rimadil  Advil Cope Indocin Robaxisal  Alka-Seltzer Effervescent Pain Reliever and Antacid Coricidin or Coricidin-D  Indomethacin Rufen  Alka-Seltzer plus Cold Medicine Cosprin Ketoprofen S-A-C Tablets  Anacin Analgesic Tablets or Capsules Coumadin Korlgesic Salflex  Anacin Extra Strength Analgesic tablets or capsules CP-2 Tablets Lanoril Salicylate  Anaprox Cuprimine Capsules Levenox Salocol  Anexsia-D Dalteparin Magan Salsalate  Anodynos Darvon compound Magnesium Salicylate Sine-off  Ansaid Dasin Capsules Magsal Sodium Salicylate  Anturane Depen Capsules Marnal Soma  APF Arthritis pain formula Dewitt's Pills Measurin Stanback  Argesic Dia-Gesic Meclofenamic Sulfinpyrazone  Arthritis Bayer Timed Release Aspirin Diclofenac Meclomen Sulindac  Arthritis pain formula Anacin Dicumarol Medipren Supac  Analgesic (Safety coated) Arthralgen Diffunasal Mefanamic Suprofen  Arthritis Strength Bufferin Dihydrocodeine Mepro Compound Suprol  Arthropan liquid Dopirydamole Methcarbomol with Aspirin Synalgos  ASA tablets/Enseals Disalcid Micrainin Tagament  Ascriptin Doan's Midol Talwin  Ascriptin A/D  Dolene Mobidin Tanderil  Ascriptin Extra Strength Dolobid Moblgesic Ticlid  Ascriptin with Codeine Doloprin or Doloprin with Codeine Momentum Tolectin  Asperbuf Duoprin Mono-gesic Trendar  Aspergum Duradyne Motrin or Motrin IB Triminicin  Aspirin plain, buffered or enteric coated Durasal Myochrisine Trigesic  Aspirin Suppositories Easprin Nalfon Trillsate  Aspirin with Codeine Ecotrin Regular or Extra Strength Naprosyn Uracel  Atromid-S Efficin Naproxen Ursinus  Auranofin Capsules Elmiron Neocylate Vanquish  Axotal Emagrin Norgesic Verin  Azathioprine Empirin or Empirin with Codeine Normiflo Vitamin E  Azolid Emprazil Nuprin Voltaren  Bayer Aspirin plain, buffered or children's or timed BC Tablets or powders Encaprin Orgaran Warfarin Sodium  Buff-a-Comp Enoxaparin Orudis Zorpin  Buff-a-Comp with Codeine Equegesic Os-Cal-Gesic   Buffaprin Excedrin plain, buffered or Extra Strength Oxalid   Bufferin Arthritis Strength Feldene Oxphenbutazone   Bufferin plain or Extra Strength Feldene Capsules Oxycodone with Aspirin   Bufferin with Codeine Fenoprofen Fenoprofen Pabalate or Pabalate-SF   Buffets II Flogesic Panagesic   Buffinol plain or Extra Strength Florinal or Florinal with Codeine Panwarfarin   Buf-Tabs Flurbiprofen Penicillamine   Butalbital Compound Four-way cold tablets Penicillin   Butazolidin Fragmin Pepto-Bismol   Carbenicillin Geminisyn Percodan   Carna Arthritis Reliever Geopen Persantine   Carprofen Gold's salt Persistin   Chloramphenicol Goody's Phenylbutazone   Chloromycetin Haltrain Piroxlcam   Clmetidine heparin Plaquenil   Cllnoril Hyco-pap Ponstel   Clofibrate Hydroxy chloroquine Propoxyphen         Before stopping any of these medications, be sure to consult the physician who ordered them.  Some, such as Coumadin (Warfarin) are ordered to prevent or treat serious conditions such as "deep thrombosis", "pumonary embolisms", and other heart problems.  The amount of time  that you may need off of the medication may also vary with the medication and the reason for which you were taking it.  If you are taking any of these medications, please make sure you notify your pain physician before you undergo any procedures.         Radiofrequency Lesioning Radiofrequency lesioning is a procedure that is performed to relieve pain. The procedure is often used for back, neck, or arm pain. Radiofrequency lesioning involves the use of a machine that creates radio waves to make heat. During the procedure, the heat is applied to the nerve that carries the pain signal. The heat damages the nerve and interferes with the pain signal. Pain relief usually starts about 2 weeks after the procedure and lasts  for 6 months to 1 year. Tell a health care provider about:  Any allergies you have.  All medicines you are taking, including vitamins, herbs, eye drops, creams, and over-the-counter medicines.  Any problems you or family members have had with anesthetic medicines.  Any blood disorders you have.  Any surgeries you have had.  Any medical conditions you have.  Whether you are pregnant or may be pregnant. What are the risks? Generally, this is a safe procedure. However, problems may occur, including:  Pain or soreness at the injection site.  Infection at the injection site.  Damage to nerves or blood vessels.  What happens before the procedure?  Ask your health care provider about: ? Changing or stopping your regular medicines. This is especially important if you are taking diabetes medicines or blood thinners. ? Taking medicines such as aspirin and ibuprofen. These medicines can thin your blood. Do not take these medicines before your procedure if your health care provider instructs you not to.  Follow instructions from your health care provider about eating or drinking restrictions.  Plan to have someone take you home after the procedure.  If you go home right  after the procedure, plan to have someone with you for 24 hours. What happens during the procedure?  You will be given one or more of the following: ? A medicine to help you relax (sedative). ? A medicine to numb the area (local anesthetic).  You will be awake during the procedure. You will need to be able to talk with the health care provider during the procedure.  With the help of a type of X-ray (fluoroscopy), the health care provider will insert a radiofrequency needle into the area to be treated.  Next, a wire that carries the radio waves (electrode) will be put through the radiofrequency needle. An electrical pulse will be sent through the electrode to verify the correct nerve. You will feel a tingling sensation, and you may have muscle twitching.  Then, the tissue that is around the needle tip will be heated by an electric current that is passed using the radiofrequency machine. This will numb the nerves.  A bandage (dressing) will be put on the insertion area after the procedure is done. The procedure may vary among health care providers and hospitals. What happens after the procedure?  Your blood pressure, heart rate, breathing rate, and blood oxygen level will be monitored often until the medicines you were given have worn off.  Return to your normal activities as directed by your health care provider. This information is not intended to replace advice given to you by your health care provider. Make sure you discuss any questions you have with your health care provider. Document Released: 11/09/2010 Document Revised: 08/19/2015 Document Reviewed: 04/20/2014 Elsevier Interactive Patient Education  [employer].

## 2017-12-20 NOTE — Progress Notes (Signed)
Patient's Name: Susan [name]  MRN: [id number]  Referring Provider: [name], MD  DOB: [birth year]  PCP: [name], MD  DOS: 12/20/2017  Note by: Gillis Santa, MD  Service setting: Ambulatory outpatient  Specialty: Interventional Pain Management  Location: [facility] (AMB) Pain Management Facility    Patient type: Established   Primary Reason(s) for Visit: Evaluation of chronic illnesses with exacerbation, or progression (Level of risk: moderate) CC: Back Pain (lower)  HPI  Susan [name] is a 83 y.o. year old, female patient, who comes today for a follow-up evaluation. She has Old anterior wall myocardial infarction; Hypertensive heart disease without CHF; Ischemic cardiomyopathy; Coronary artery disease; Dyslipidemia; NSVT (nonsustained ventricular tachycardia) ([place]); Sick sinus syndrome ([place]); Kidney stones; CA skin, basal cell; Gastro-esophageal reflux disease without esophagitis; Lumbar canal stenosis; Osteopenia; Arthritis, degenerative; Carcinoma of endometrium ([place]); Neuralgia neuritis, sciatic nerve; Absence of bladder continence; Neuropathy associated with cancer ([place]); Sacral insufficiency fracture; Pulmonary nodules; Obesity (BMI 30-39.9); Bradycardia; Port-A-Cath in place; and Persistent atrial fibrillation ([place]) on their problem list. Susan [name] was last seen on Visit date not found. Her primarily concern today is the Back Pain (lower)  Pain Assessment: Location: Lower Back Radiating: denies Onset: More than a month ago Duration: Chronic pain Quality: Aching Severity: 5 /10 (subjective, self-reported pain score)  Note: Reported level is compatible with observation.                         When using our objective Pain Scale, levels between 6 and 10/10 are said to belong in an emergency room, as it progressively worsens from a 6/10, described as severely limiting, requiring emergency care not usually available at an outpatient pain management facility. At a 6/10 level,  communication becomes difficult and requires great effort. Assistance to reach the emergency department may be required. Facial flushing and profuse sweating along with potentially dangerous increases in heart rate and blood pressure will be evident. Effect on ADL:   Timing: Constant Modifying factors: sitting, lying down BP: 129/70  HR: (!) 44  Further details on both, my assessment(s), as well as the proposed treatment plan, please see below.  Patient follows up endorsing return of axial low back pain.  She is status post 2 series of diagnostic bilateral medial branch nerve blocks at L3, L4, L5 on 12/13/2016 as well as 01/03/2017.  With both of these blocks, the patient experienced greater than 50% pain relief for approximately 2-1/2 weeks after the block.  She also had improved range of motion and ambulation after this block.  She is interested now in proceeding with radiofrequency ablation starting with the left side first.  Of note patient is on Eliquis for her atrial fibrillation.  She will need cardiac clearance to stop this medication 3 days prior to her scheduled procedure.  Laboratory Chemistry  Inflammation Markers (CRP: Acute Phase) (ESR: Chronic Phase) No results found for: CRP, ESRSEDRATE, LATICACIDVEN                       Rheumatology Markers No results found for: RF, ANA, LABURIC, URICUR, LYMEIGGIGMAB, LYMEABIGMQN, HLAB27                      Renal Function Markers Lab Results  Component Value Date   BUN 19 09/13/2017   CREATININE 0.90 09/13/2017   GFRAA >60 09/13/2017   GFRNONAA 59 (L) 09/13/2017  Hepatic Function Markers Lab Results  Component Value Date   AST 33 09/13/2017   ALT 16 09/13/2017   ALBUMIN 4.0 09/13/2017   ALKPHOS 90 09/13/2017                        Electrolytes Lab Results  Component Value Date   NA 141 09/13/2017   K 3.8 09/13/2017   CL 110 09/13/2017   CALCIUM 9.6 09/13/2017   MG 2.2 07/14/2015                         Neuropathy Markers Lab Results  Component Value Date   VITAMINB12 1,218 (H) 07/21/2015   HGBA1C 5.7 (H) 10/19/2012                        CNS Tests No results found for: COLORCSF, APPEARCSF, RBCCOUNTCSF, WBCCSF, POLYSCSF, LYMPHSCSF, EOSCSF, PROTEINCSF, GLUCCSF, JCVIRUS, CSFOLI, IGGCSF                      Bone Pathology Markers No results found for: VD25OH, [id number], [id number], [id number], 25OHVITD1, 25OHVITD2, 25OHVITD3, TESTOFREE, TESTOSTERONE                       Coagulation Parameters Lab Results  Component Value Date   INR 1.0 12/17/2013   LABPROT 13.3 12/17/2013   APTT 91 (H) 10/19/2012   PLT 217 09/13/2017                        Cardiovascular Markers Lab Results  Component Value Date   CKTOTAL 959 (H) 10/19/2012   CKMB 63.8 (H) 10/19/2012   TROPONINI <0.03 01/17/2017   HGB 12.3 09/13/2017   HCT 36.3 09/13/2017                         CA Markers Lab Results  Component Value Date   CA125 13.6 08/15/2016                        Note: Lab results reviewed.  Recent Diagnostic Imaging Review  Cervical Imaging:  Lumbar MR w/wo contrast:  Results for orders placed during the hospital encounter of 12/12/17  MR LUMBAR SPINE W WO CONTRAST   Narrative CLINICAL DATA:  Low back pain for 6 weeks.  Endometrial cancer.  EXAM: MRI LUMBAR SPINE WITHOUT AND WITH CONTRAST  TECHNIQUE: Multiplanar and multiecho pulse sequences of the lumbar spine were obtained without and with intravenous contrast.  CONTRAST:  19m MULTIHANCE GADOBENATE DIMEGLUMINE 529 MG/ML IV SOLN  COMPARISON:  CT abdomen pelvis 12/12/2017, also 08/31/2016.  FINDINGS: Segmentation:  Standard.  Alignment: Degenerative scoliosis convex LEFT mid lumbar region. Asymmetric loss of interspace height at L2-3 on the RIGHT is contributory  Vertebrae: No worrisome osseous lesion. Advanced degenerative change at L1-2 Modic type 1 changes no evidence metastatic disease. Post XRT changes. Mild  enhancement superior S2, as well as STIR and enhancement BILATERAL sacral ala, correlates with sclerosis on CT, likely healed or healing insufficiency fracture.  Conus medullaris and cauda equina: Conus extends to the L1 level. Conus and cauda equina appear normal.  Paraspinal and other soft tissues: Reported separately on CT. LEFT renal calculi. No hydronephrosis.  Disc levels:  L1-L2: Advanced degenerative change. Disc space narrowing, vacuum phenomenon, and bony overgrowth. Endplate enhancement reflects active degeneration. Superimposed  disc extrusion centrally results in stenosis. RIGHT greater than LEFT L2 neural impingement.  L2-L3: Functional fusion across the RIGHT side of the interspace. Near complete loss of interspace height. Shallow protrusion/extrusion in the midline caudal migration does not clearly the exiting nerve roots. RIGHT-sided foraminal narrowing could irritate the L2 nerve root.  L3-L4: Disc space narrowing. Posterior element hypertrophy. Central protrusion. Mild stenosis. RIGHT L3 and BILATERAL L4 neural impingement are possible.  L4-L5: 5 mm slip. Disc space narrowing. Central disc extrusion. Posterior element hypertrophy. Severe stenosis. Cephalad migrated free fragment. BILATERAL L5, and RIGHT L4 neural impingement are likely.  L5-S1: Disc space narrowing. Central and leftward protrusion extends to the foramen. Posterior element hypertrophy. LEFT L5 and LEFT S1 neural impingement.  IMPRESSION: The major finding is severe stenosis at L4-5 related to 5 mm slip, central disc extrusion with cephalad migrated free fragment. Posterior element hypertrophy is superimposed. BILATERAL L5 neural impingement along with RIGHT L4 neural foraminal narrowing is noted.  Similar less severe changes at L1-2, L2-3, L3-4, and L5-S1 as described above.  No evidence of metastatic disease. Endplate enhancement at L1-2 is degenerative. Mild enhancement of the sacrum  correlates with sclerosis on CT, and is consistent with healing/healed insufficiency fractures.   Electronically Signed   By: Staci Righter M.D.   On: 12/13/2017 08:27      Complexity Note: Imaging results reviewed. Results shared with Susan [name], using Layman's terms.                         Meds   Current Outpatient Medications:  .  acetaminophen (TYLENOL) 650 MG CR tablet, Take 650 mg by mouth every 8 (eight) hours as needed for pain., Disp: , Rfl:  .  apixaban (ELIQUIS) 5 MG TABS tablet, Take 1 tablet (5 mg total) by mouth 2 (two) times daily., Disp: 60 tablet, Rfl: 6 .  aspirin EC 81 MG tablet, Take 81 mg by mouth daily., Disp: , Rfl:  .  atorvastatin (LIPITOR) 80 MG tablet, TAKE 1 TABLET (80 MG TOTAL) BY MOUTH DAILY AT 6 PM., Disp: 90 tablet, Rfl: 1 .  Calcium-Magnesium-Vitamin D (CALCIUM 1200+D3 PO), Take 1 tablet by mouth daily. , Disp: , Rfl:  .  ezetimibe (ZETIA) 10 MG tablet, Take 1 tablet (10 mg total) by mouth daily., Disp: 30 tablet, Rfl: 0 .  fluticasone (FLONASE) 50 MCG/ACT nasal spray, SPRAY 2 SPRAYS INTO EACH NOSTRIL EVERY DAY, Disp: , Rfl:  .  furosemide (LASIX) 20 MG tablet, Take 20 mg by mouth daily as needed for fluid., Disp: , Rfl:  .  isosorbide mononitrate (IMDUR) 30 MG 24 hr tablet, TAKE 1 TABLET (30 MG TOTAL) BY MOUTH DAILY., Disp: 90 tablet, Rfl: 1 .  loperamide (IMODIUM A-D) 2 MG tablet, Take 2 mg by mouth 4 (four) times daily as needed for diarrhea or loose stools., Disp: , Rfl:  .  losartan (COZAAR) 50 MG tablet, TAKE 1 TABLET BY MOUTH EVERY DAY, Disp: 90 tablet, Rfl: 3 .  Multiple Vitamin (MULTIVITAMIN WITH MINERALS) TABS, Take 1 tablet by mouth daily., Disp: , Rfl:  .  NITROSTAT 0.4 MG SL tablet, PLACE 1 TABLET UNDER THE TOUGUE EVERY 5 MINUTES FOR UP TO 3 DOSES AS NEEDED FOR CHEST PAIN, Disp: 25 tablet, Rfl: 0 .  omeprazole (PRILOSEC) 20 MG capsule, Take 20 mg by mouth daily., Disp: , Rfl:  .  vitamin B-12 (CYANOCOBALAMIN) 1000 MCG tablet, Take 1,000  mcg by mouth daily., Disp: ,  Rfl:  .  gabapentin (NEURONTIN) 300 MG capsule, Take 1 capsule (300 mg total) by mouth 3 (three) times daily., Disp: 90 capsule, Rfl: 2 No current facility-administered medications for this visit.   Facility-Administered Medications Ordered in Other Visits:  .  sodium chloride flush (NS) 0.9 % injection 10 mL, 10 mL, Intravenous, PRN, Charlaine Dalton R, MD, 10 mL at 06/06/16 1447 .  sodium chloride flush (NS) 0.9 % injection 10 mL, 10 mL, Intravenous, PRN, Cammie Sickle, MD, 10 mL at 08/15/16 1404  ROS  Constitutional: Denies any fever or chills Gastrointestinal: No reported hemesis, hematochezia, vomiting, or acute GI distress Musculoskeletal: Denies any acute onset joint swelling, redness, loss of ROM, or weakness Neurological: No reported episodes of acute onset apraxia, aphasia, dysarthria, agnosia, amnesia, paralysis, loss of coordination, or loss of consciousness  Allergies  Susan [name] is allergic to ace inhibitors and penicillins.  [place]  Drug: Susan [name]  reports that she does not use drugs. Alcohol:  reports that she drinks about 1.0 standard drinks of alcohol per week. Tobacco:  reports that she quit smoking about 43 years ago. She has never used smokeless tobacco. Medical:  has a past medical history of Bradycardia, Chronic back pain, Coronary artery disease, Echoencephalogram abnormality, Heart murmur, History of Holter monitoring, Hyperlipidemia, Hypertension, Myocardial infarction [facility]), Neuromuscular disorder ([place]), NSVT (nonsustained ventricular tachycardia) ([place]), Personal history of chemotherapy, Personal history of radiation therapy, and Uterine cancer ([place]). Surgical: Susan [name]  has a past surgical history that includes left heart catheterization with coronary angiogram (10/19/2012); percutaneous coronary stent intervention (pci-s) (10/19/2012); percutaneous coronary stent intervention (pci-s) (N/A, 10/23/2012); Abdominal  hysterectomy; Cardiac catheterization (N/A, 05/05/2015); Breast excisional biopsy (Right, 1956); PORTA CATH REMOVAL (N/A, 04/05/2017); and Cardioversion (N/A, 07/27/2017). Family: family history includes Breast cancer (age of onset: 47) in her mother; Diabetes in her brother and father; Heart disease in her father and mother; Stroke in her brother.  Constitutional Exam  General appearance: Well nourished, well developed, and well hydrated. In no apparent acute distress Vitals:   12/20/17 1133  BP: 129/70  Pulse: (!) 44  Resp: 18  Temp: 98.5 F (36.9 C)  TempSrc: Oral  SpO2: 98%  Weight: 209 lb (94.8 kg)  Height: _0  (1.626 m)   BMI Assessment: Estimated body mass index is 35.87 kg/m as calculated from the following:   Height as of this encounter: _1  (1.626 m).   Weight as of this encounter: 209 lb (94.8 kg).  BMI interpretation table: BMI level Category Range association with higher incidence of chronic pain  <18 kg/m2 Underweight   18.5-24.9 kg/m2 Ideal body weight   25-29.9 kg/m2 Overweight Increased incidence by 20%  30-34.9 kg/m2 Obese (Class I) Increased incidence by 68%  35-39.9 kg/m2 Severe obesity (Class II) Increased incidence by 136%  >40 kg/m2 Extreme obesity (Class III) Increased incidence by 254%   Patient's current BMI Ideal Body weight  Body mass index is 35.87 kg/m. Ideal body weight: 54.7 kg (120 lb 9.5 oz) Adjusted ideal body weight: 70.7 kg (155 lb 15.3 oz)   BMI Readings from Last 4 Encounters:  12/20/17 35.87 kg/m  12/17/17 (P) 36.50 kg/m  11/21/17 (P) 35.50 kg/m  10/31/17 36.22 kg/m   Wt Readings from Last 4 Encounters:  12/20/17 209 lb (94.8 kg)  12/17/17 (P) 212 lb 10.1 oz (96.5 kg)  11/21/17 (P) 206 lb 12.7 oz (93.8 kg)  10/31/17 211 lb (95.7 kg)  Psych/Mental status: Alert, oriented x 3 (person, place, &  time)       Eyes: PERLA Respiratory: No evidence of acute respiratory distress  Cervical Spine Area Exam  Skin & Axial Inspection: No  masses, redness, edema, swelling, or associated skin lesions Alignment: Symmetrical Functional ROM: Unrestricted ROM      Stability: No instability detected Muscle Tone/Strength: Functionally intact. No obvious neuro-muscular anomalies detected. Sensory (Neurological): Unimpaired Palpation: No palpable anomalies              Upper Extremity (UE) Exam    Side: Right upper extremity  Side: Left upper extremity  Skin & Extremity Inspection: Skin color, temperature, and hair growth are WNL. No peripheral edema or cyanosis. No masses, redness, swelling, asymmetry, or associated skin lesions. No contractures.  Skin & Extremity Inspection: Skin color, temperature, and hair growth are WNL. No peripheral edema or cyanosis. No masses, redness, swelling, asymmetry, or associated skin lesions. No contractures.  Functional ROM: Unrestricted ROM          Functional ROM: Unrestricted ROM          Muscle Tone/Strength: Functionally intact. No obvious neuro-muscular anomalies detected.  Muscle Tone/Strength: Functionally intact. No obvious neuro-muscular anomalies detected.  Sensory (Neurological): Unimpaired          Sensory (Neurological): Unimpaired          Palpation: No palpable anomalies              Palpation: No palpable anomalies              Provocative Test(s):  Phalen's test: deferred Tinel's test: deferred Apley's scratch test (touch opposite shoulder):  Action 1 (Across chest): deferred Action 2 (Overhead): deferred Action 3 (LB reach): deferred   Provocative Test(s):  Phalen's test: deferred Tinel's test: deferred Apley's scratch test (touch opposite shoulder):  Action 1 (Across chest): deferred Action 2 (Overhead): deferred Action 3 (LB reach): deferred    Thoracic Spine Area Exam  Skin & Axial Inspection: No masses, redness, or swelling Alignment: Symmetrical Functional ROM: Unrestricted ROM Stability: No instability detected Muscle Tone/Strength: Functionally intact. No obvious  neuro-muscular anomalies detected. Sensory (Neurological): Unimpaired Muscle strength & Tone: No palpable anomalies  Lumbar Spine Area Exam  Skin & Axial Inspection: No masses, redness, or swelling Alignment: Symmetrical Functional ROM: Unrestricted ROM       Stability: No instability detected Muscle Tone/Strength: Functionally intact. No obvious neuro-muscular anomalies detected. Sensory (Neurological): Unimpaired Palpation: No palpable anomalies       Provocative Tests: Hyperextension/rotation test: (+) bilaterally for facet joint pain. Lumbar quadrant test (Kemp's test): (+) bilaterally for facet joint pain. Lateral bending test: (+) due to pain. Patrick's Maneuver: deferred today                   FABER test: deferred today                   S-I anterior distraction/compression test: deferred today         S-I lateral compression test: deferred today         S-I Thigh-thrust test: deferred today         S-I Gaenslen's test: deferred today          Gait & Posture Assessment  Ambulation: Unassisted Gait: Relatively normal for age and body habitus Posture: WNL   Lower Extremity Exam    Side: Right lower extremity  Side: Left lower extremity  Stability: No instability observed          Stability: No instability observed  Skin & Extremity Inspection: Skin color, temperature, and hair growth are WNL. No peripheral edema or cyanosis. No masses, redness, swelling, asymmetry, or associated skin lesions. No contractures.  Skin & Extremity Inspection: Skin color, temperature, and hair growth are WNL. No peripheral edema or cyanosis. No masses, redness, swelling, asymmetry, or associated skin lesions. No contractures.  Functional ROM: Unrestricted ROM                  Functional ROM: Unrestricted ROM                  Muscle Tone/Strength: Functionally intact. No obvious neuro-muscular anomalies detected.  Muscle Tone/Strength: Functionally intact. No obvious neuro-muscular  anomalies detected.  Sensory (Neurological): Unimpaired  Sensory (Neurological): Unimpaired  Palpation: No palpable anomalies  Palpation: No palpable anomalies   Assessment  Primary Diagnosis & Pertinent Problem List: The primary encounter diagnosis was Spondylosis without myelopathy or radiculopathy, lumbar region. Diagnoses of Lumbar facet arthropathy, Neuropathy associated with cancer ([place]), and Sacral insufficiency fracture, initial encounter were also pertinent to this visit.  Status Diagnosis  Having a Flare-up Persistent Persistent 1. Spondylosis without myelopathy or radiculopathy, lumbar region   2. Lumbar facet arthropathy   3. Neuropathy associated with cancer ([place])   4. Sacral insufficiency fracture, initial encounter      General Recommendations: The pain condition that the patient suffers from is best treated with a multidisciplinary approach that involves an increase in physical activity to prevent de-conditioning and worsening of the pain cycle, as well as psychological counseling (formal and/or informal) to address the co-morbid psychological affects of pain. Treatment will often involve judicious use of pain medications and interventional procedures to decrease the pain, allowing the patient to participate in the physical activity that will ultimately produce long-lasting pain reductions. The goal of the multidisciplinary approach is to return the patient to a higher level of overall function and to restore their ability to perform activities of daily living.  Patient follows up endorsing return of axial low back pain.  She is status post 2 series of diagnostic bilateral medial branch nerve blocks at L3, L4, L5 on 12/13/2016 as well as 01/03/2017.  With both of these blocks, the patient experienced greater than 50% pain relief for approximately 2-1/2 weeks after the block.  She also had improved range of motion and ambulation after this block.  She is interested now in proceeding  with radiofrequency ablation starting with the left side first.  Of note patient is on Eliquis for her atrial fibrillation.  She will need cardiac clearance to stop this medication 3 days prior to her scheduled procedure.  I will also refill the patient's gabapentin prescription as below.  Risks and benefits of lumbar radiofrequency ablation were discussed with patient.  Patient would like to proceed.  Plan: -Refill gabapentin as below -Plan for left L3, L4, L5 RFA with sedation under fluoroscopy (patient must stop Eliquis 3 days prior to scheduled procedure) patient will need cardiac clearance.  Plan of Care  Pharmacotherapy (Medications Ordered): Meds ordered this encounter  Medications  . gabapentin (NEURONTIN) 300 MG capsule    Sig: Take 1 capsule (300 mg total) by mouth 3 (three) times daily.    Dispense:  90 capsule    Refill:  2   Lab-work, procedure(s), and/or referral(s): Orders Placed This Encounter  Procedures  . Radiofrequency,Lumbar   Time Note: Greater than 50% of the 25 minute(s) of face-to-face time spent with Susan [name], was spent in counseling/coordination of care  regarding: Susan [name] primary cause of pain, the treatment plan, treatment alternatives, the risks and possible complications of proposed treatment, going over the informed consent, the results, interpretation and significance of  her recent diagnostic interventional treatment(s), realistic expectations and the goals of pain management (increased in functionality).  Provider-requested follow-up: Return in about 3 weeks (around 01/10/2018) for Procedure, Blood Thinner Protocol.  Future Appointments  Date Time Provider [place]  12/25/2017 11:15 AM [facility]-NURSE [facility] None  01/16/2018  8:00 AM Gillis Santa, MD [facility] None  01/21/2018  3:15 PM Bjorn Loser, MD [facility] None  02/20/2018  2:00 PM [facility] GYN ONC [facility] None  03/15/2018  2:00 PM [facility] LAB [facility] None  03/15/2018   2:30 PM Lequita Asal, MD [facility] None  12/18/2018  1:30 PM Noreene Filbert, MD [facility] None    Primary Care Physician: [name], MD Location: [facility] Management Facility Note by: Gillis Santa, M.D Date: 12/20/2017; Time: 12:32 PM  Patient Instructions   1.  We will get you scheduled for left-sided radiofrequency ablation.  We will also obtain cardiac clearance from your cardiologist to see if it is safe to stop your Eliquis 3 days prior to your scheduled procedure.  2.  I have also refilled your gabapentin.GENERAL RISKS AND COMPLICATIONS  What are the risk, side effects and possible complications? Generally speaking, most procedures are safe.  However, with any procedure there are risks, side effects, and the possibility of complications.  The risks and complications are dependent upon the sites that are lesioned, or the type of nerve block to be performed.  The closer the procedure is to the spine, the more serious the risks are.  Great care is taken when placing the radio frequency needles, block needles or lesioning probes, but sometimes complications can occur. 1. Infection: Any time there is an injection through the skin, there is a risk of infection.  This is why sterile conditions are used for these blocks.  There are four possible types of infection. 1. Localized skin infection. 2. Central Nervous System Infection-This can be in the form of Meningitis, which can be deadly. 3. Epidural Infections-This can be in the form of an epidural abscess, which can cause pressure inside of the spine, causing compression of the spinal cord with subsequent paralysis. This would require an emergency surgery to decompress, and there are no guarantees that the patient would recover from the paralysis. 4. Discitis-This is an infection of the intervertebral discs.  It occurs in about 1% of discography procedures.  It is difficult to treat and it may lead to surgery.         2. Pain: the needles have to go through skin and soft tissues, will cause soreness.       3. Damage to internal structures:  The nerves to be lesioned may be near blood vessels or    other nerves which can be potentially damaged.       4. Bleeding: Bleeding is more common if the patient is taking blood thinners such as  aspirin, Coumadin, Ticiid, Plavix, etc., or if he/she have some genetic predisposition  such as hemophilia. Bleeding into the spinal canal can cause compression of the spinal  cord with subsequent paralysis.  This would require an emergency surgery to  decompress and there are no guarantees that the patient would recover from the  paralysis.       5. Pneumothorax:  Puncturing of a lung is a possibility, every time a needle is  introduced in  the area of the chest or upper back.  Pneumothorax refers to free air around the  collapsed lung(s), inside of the thoracic cavity (chest cavity).  Another two possible  complications related to a similar event would include: Hemothorax and Chylothorax.   These are variations of the Pneumothorax, where instead of air around the collapsed  lung(s), you may have blood or chyle, respectively.       6. Spinal headaches: They may occur with any procedures in the area of the spine.       7. Persistent CSF (Cerebro-Spinal Fluid) leakage: This is a rare problem, but may occur  with prolonged intrathecal or epidural catheters either due to the formation of a fistulous  track or a dural tear.       8. Nerve damage: By working so close to the spinal cord, there is always a possibility of  nerve damage, which could be as serious as a permanent spinal cord injury with  paralysis.       9. Death:  Although rare, severe deadly allergic reactions known as "Anaphylactic  reaction" can occur to any of the medications used.      10. Worsening of the symptoms:  We can always make thing worse.  What are the chances of something like this happening? Chances of any of this  occuring are extremely low.  By statistics, you have more of a chance of getting killed in a motor vehicle accident: while driving to the hospital than any of the above occurring .  Nevertheless, you should be aware that they are possibilities.  In general, it is similar to taking a shower.  Everybody knows that you can slip, hit your head and get killed.  Does that mean that you should not shower again?  Nevertheless always keep in mind that statistics do not mean anything if you happen to be on the wrong side of them.  Even if a procedure has a 1 (one) in a 1,000,000 (million) chance of going wrong, it you happen to be that one..Also, keep in mind that by statistics, you have more of a chance of having something go wrong when taking medications.  Who should not have this procedure? If you are on a blood thinning medication (e.g. Coumadin, Plavix, see list of "Blood Thinners"), or if you have an active infection going on, you should not have the procedure.  If you are taking any blood thinners, please inform your physician.  How should I prepare for this procedure?  Do not eat or drink anything at least six hours prior to the procedure.  Bring a driver with you .  It cannot be a taxi.  Come accompanied by an adult that can drive you back, and that is strong enough to help you if your legs get weak or numb from the local anesthetic.  Take all of your medicines the morning of the procedure with just enough water to swallow them.  If you have diabetes, make sure that you are scheduled to have your procedure done first thing in the morning, whenever possible.  If you have diabetes, take only half of your insulin dose and notify our nurse that you have done so as soon as you arrive at the clinic.  If you are diabetic, but only take blood sugar pills (oral hypoglycemic), then do not take them on the morning of your procedure.  You may take them after you have had the procedure.  Do not take  aspirin  or any aspirin-containing medications, at least eleven (11) days prior to the procedure.  They may prolong bleeding.  Wear loose fitting clothing that may be easy to take off and that you would not mind if it got stained with Betadine or blood.  Do not wear any jewelry or perfume  Remove any nail coloring.  It will interfere with some of our monitoring equipment.  NOTE: Remember that this is not meant to be interpreted as a complete list of all possible complications.  Unforeseen problems may occur.  BLOOD THINNERS The following drugs contain aspirin or other products, which can cause increased bleeding during surgery and should not be taken for 2 weeks prior to and 1 week after surgery.  If you should need take something for relief of minor pain, you may take acetaminophen which is found in Tylenol,m Datril, Anacin-3 and Panadol. It is not blood thinner. The products listed below are.  Do not take any of the products listed below in addition to any listed on your instruction sheet.  A.P.C or A.P.C with Codeine Codeine Phosphate Capsules #3 Ibuprofen Ridaura  ABC compound Congesprin Imuran rimadil  Advil Cope Indocin Robaxisal  Alka-Seltzer Effervescent Pain Reliever and Antacid Coricidin or Coricidin-D  Indomethacin Rufen  Alka-Seltzer plus Cold Medicine Cosprin Ketoprofen S-A-C Tablets  Anacin Analgesic Tablets or Capsules Coumadin Korlgesic Salflex  Anacin Extra Strength Analgesic tablets or capsules CP-2 Tablets Lanoril Salicylate  Anaprox Cuprimine Capsules Levenox Salocol  Anexsia-D Dalteparin Magan Salsalate  Anodynos Darvon compound Magnesium Salicylate Sine-off  Ansaid Dasin Capsules Magsal Sodium Salicylate  Anturane Depen Capsules Marnal Soma  APF Arthritis pain formula Dewitt's Pills Measurin Stanback  Argesic Dia-Gesic Meclofenamic Sulfinpyrazone  Arthritis Bayer Timed Release Aspirin Diclofenac Meclomen Sulindac  Arthritis pain formula Anacin Dicumarol Medipren Supac   Analgesic (Safety coated) Arthralgen Diffunasal Mefanamic Suprofen  Arthritis Strength Bufferin Dihydrocodeine Mepro Compound Suprol  Arthropan liquid Dopirydamole Methcarbomol with Aspirin Synalgos  ASA tablets/Enseals Disalcid Micrainin Tagament  Ascriptin Doan's Midol Talwin  Ascriptin A/D Dolene Mobidin Tanderil  Ascriptin Extra Strength Dolobid Moblgesic Ticlid  Ascriptin with Codeine Doloprin or Doloprin with Codeine Momentum Tolectin  Asperbuf Duoprin Mono-gesic Trendar  Aspergum Duradyne Motrin or Motrin IB Triminicin  Aspirin plain, buffered or enteric coated Durasal Myochrisine Trigesic  Aspirin Suppositories Easprin Nalfon Trillsate  Aspirin with Codeine Ecotrin Regular or Extra Strength Naprosyn Uracel  Atromid-S Efficin Naproxen Ursinus  Auranofin Capsules Elmiron Neocylate Vanquish  Axotal Emagrin Norgesic Verin  Azathioprine Empirin or Empirin with Codeine Normiflo Vitamin E  Azolid Emprazil Nuprin Voltaren  Bayer Aspirin plain, buffered or children's or timed BC Tablets or powders Encaprin Orgaran Warfarin Sodium  Buff-a-Comp Enoxaparin Orudis Zorpin  Buff-a-Comp with Codeine Equegesic Os-Cal-Gesic   Buffaprin Excedrin plain, buffered or Extra Strength Oxalid   Bufferin Arthritis Strength Feldene Oxphenbutazone   Bufferin plain or Extra Strength Feldene Capsules Oxycodone with Aspirin   Bufferin with Codeine Fenoprofen Fenoprofen Pabalate or Pabalate-SF   Buffets II Flogesic Panagesic   Buffinol plain or Extra Strength Florinal or Florinal with Codeine Panwarfarin   Buf-Tabs Flurbiprofen Penicillamine   Butalbital Compound Four-way cold tablets Penicillin   Butazolidin Fragmin Pepto-Bismol   Carbenicillin Geminisyn Percodan   Carna Arthritis Reliever Geopen Persantine   Carprofen Gold's salt Persistin   Chloramphenicol Goody's Phenylbutazone   Chloromycetin Haltrain Piroxlcam   Clmetidine heparin Plaquenil   Cllnoril Hyco-pap Ponstel   Clofibrate Hydroxy  chloroquine Propoxyphen         Before stopping any of  these medications, be sure to consult the physician who ordered them.  Some, such as Coumadin (Warfarin) are ordered to prevent or treat serious conditions such as "deep thrombosis", "pumonary embolisms", and other heart problems.  The amount of time that you may need off of the medication may also vary with the medication and the reason for which you were taking it.  If you are taking any of these medications, please make sure you notify your pain physician before you undergo any procedures.         Radiofrequency Lesioning Radiofrequency lesioning is a procedure that is performed to relieve pain. The procedure is often used for back, neck, or arm pain. Radiofrequency lesioning involves the use of a machine that creates radio waves to make heat. During the procedure, the heat is applied to the nerve that carries the pain signal. The heat damages the nerve and interferes with the pain signal. Pain relief usually starts about 2 weeks after the procedure and lasts for 6 months to 1 year. Tell a health care provider about:  Any allergies you have.  All medicines you are taking, including vitamins, herbs, eye drops, creams, and over-the-counter medicines.  Any problems you or family members have had with anesthetic medicines.  Any blood disorders you have.  Any surgeries you have had.  Any medical conditions you have.  Whether you are pregnant or may be pregnant. What are the risks? Generally, this is a safe procedure. However, problems may occur, including:  Pain or soreness at the injection site.  Infection at the injection site.  Damage to nerves or blood vessels.  What happens before the procedure?  Ask your health care provider about: ? Changing or stopping your regular medicines. This is especially important if you are taking diabetes medicines or blood thinners. ? Taking medicines such as aspirin and ibuprofen. These  medicines can thin your blood. Do not take these medicines before your procedure if your health care provider instructs you not to.  Follow instructions from your health care provider about eating or drinking restrictions.  Plan to have someone take you home after the procedure.  If you go home right after the procedure, plan to have someone with you for 24 hours. What happens during the procedure?  You will be given one or more of the following: ? A medicine to help you relax (sedative). ? A medicine to numb the area (local anesthetic).  You will be awake during the procedure. You will need to be able to talk with the health care provider during the procedure.  With the help of a type of X-ray (fluoroscopy), the health care provider will insert a radiofrequency needle into the area to be treated.  Next, a wire that carries the radio waves (electrode) will be put through the radiofrequency needle. An electrical pulse will be sent through the electrode to verify the correct nerve. You will feel a tingling sensation, and you may have muscle twitching.  Then, the tissue that is around the needle tip will be heated by an electric current that is passed using the radiofrequency machine. This will numb the nerves.  A bandage (dressing) will be put on the insertion area after the procedure is done. The procedure may vary among health care providers and hospitals. What happens after the procedure?  Your blood pressure, heart rate, breathing rate, and blood oxygen level will be monitored often until the medicines you were given have worn off.  Return to your normal activities  as directed by your health care provider. This information is not intended to replace advice given to you by your health care provider. Make sure you discuss any questions you have with your health care provider. Document Released: 11/09/2010 Document Revised: 08/19/2015 Document Reviewed: 04/20/2014 Elsevier Interactive  Patient Education  [employer].

## 2017-12-20 NOTE — Progress Notes (Signed)
Safety precautions to be maintained throughout the outpatient stay will include: orient to surroundings, keep bed in low position, maintain call bell within reach at all times, provide assistance with transfer out of bed and ambulation.  

## 2017-12-21 ENCOUNTER — Telehealth: Payer: Self-pay | Admitting: *Deleted

## 2017-12-21 NOTE — Telephone Encounter (Signed)
please address Eliquis now permanent a fib

## 2017-12-21 NOTE — Telephone Encounter (Signed)
Pt takes Eliquis for afib with CHADS2VASc score of 5 (age x2, sex, CAD, HTN). Renal function is normal. Ok to hold Eliquis for 3 days prior to procedure per protocol.

## 2017-12-21 NOTE — Telephone Encounter (Signed)
   [facility] Pre-operative Risk Assessment    Request for surgical clearance:  1. What type of surgery is being performed? Lumbar facet block   2. When is this surgery scheduled? TBD   3. What type of clearance is required (medical clearance vs. Pharmacy clearance to hold med vs. Both)? pharmacy  4. Are there any medications that need to be held prior to surgery and how long? Eliquis x 3 days   5. Practice name and name of physician performing surgery? [facility] Dr.  Gillis Santa  6. What is your office phone number  [phone]   7.   What is your office fax number [phone]  8.   Anesthesia type (None, local, MAC, general) ? sedation   Susan [name] 12/21/2017, 8:24 AM  _________________________________________________________________   (provider comments below)

## 2017-12-25 ENCOUNTER — Ambulatory Visit (INDEPENDENT_AMBULATORY_CARE_PROVIDER_SITE_OTHER): Payer: Medicare Other | Admitting: Family Medicine

## 2017-12-25 DIAGNOSIS — N3946 Mixed incontinence: Secondary | ICD-10-CM

## 2017-12-25 NOTE — Progress Notes (Signed)
PT[facility]  Session # 12  Health & Social Factors: no change Caffeine: 1 Alcohol: 0 Daytime voids #per day: 4 Night-time voids #per night: 0 Urgency: none Incontinence Episodes #per day: 4 Ankle used: left Treatment Setting: 19 Feeling/ Response: none Comments: patient tolerated well  Preformed By: Elberta Leatherwood, CMA  Follow Up: 1 month w/ Dr. Matilde Sprang

## 2018-01-16 ENCOUNTER — Ambulatory Visit (HOSPITAL_BASED_OUTPATIENT_CLINIC_OR_DEPARTMENT_OTHER): Payer: Medicare Other | Admitting: Student in an Organized Health Care Education/Training Program

## 2018-01-16 ENCOUNTER — Other Ambulatory Visit: Payer: Self-pay

## 2018-01-16 ENCOUNTER — Ambulatory Visit
Admission: RE | Admit: 2018-01-16 | Discharge: 2018-01-16 | Disposition: A | Discharge status: Home / Self Care | Payer: Medicare Other | Source: Ambulatory Visit | Attending: Student in an Organized Health Care Education/Training Program | Admitting: Student in an Organized Health Care Education/Training Program

## 2018-01-16 ENCOUNTER — Encounter: Payer: Self-pay | Admitting: Student in an Organized Health Care Education/Training Program

## 2018-01-16 VITALS — BP 126/58 | HR 43 | Temp 98.0°F | Resp 17 | Ht 64.5 in | Wt 210.0 lb

## 2018-01-16 DIAGNOSIS — G8929 Other chronic pain: Secondary | ICD-10-CM | POA: Diagnosis not present

## 2018-01-16 DIAGNOSIS — M47816 Spondylosis without myelopathy or radiculopathy, lumbar region: Secondary | ICD-10-CM

## 2018-01-16 DIAGNOSIS — Z880 Allergy status to penicillin: Secondary | ICD-10-CM | POA: Insufficient documentation

## 2018-01-16 MED ORDER — LIDOCAINE HCL 2 % IJ SOLN
20.0000 mL | Freq: Once | INTRAMUSCULAR | Status: AC
  Administered 2018-01-16: 400 mg

## 2018-01-16 MED ORDER — LIDOCAINE HCL 2 % IJ SOLN
INTRAMUSCULAR | Status: AC
  Filled 2018-01-16: qty 20

## 2018-01-16 MED ORDER — FENTANYL CITRATE (PF) 100 MCG/2ML IJ SOLN
INTRAMUSCULAR | Status: AC
  Filled 2018-01-16: qty 2

## 2018-01-16 MED ORDER — ROPIVACAINE HCL 2 MG/ML IJ SOLN
10.0000 mL | Freq: Once | INTRAMUSCULAR | Status: AC
  Administered 2018-01-16: 10 mL

## 2018-01-16 MED ORDER — LACTATED RINGERS IV SOLN
1000.0000 mL | Freq: Once | INTRAVENOUS | Status: AC
  Administered 2018-01-16: 1000 mL via INTRAVENOUS

## 2018-01-16 MED ORDER — DEXAMETHASONE SODIUM PHOSPHATE 10 MG/ML IJ SOLN
10.0000 mg | Freq: Once | INTRAMUSCULAR | Status: AC
  Administered 2018-01-16: 10 mg

## 2018-01-16 MED ORDER — FENTANYL CITRATE (PF) 100 MCG/2ML IJ SOLN
25.0000 ug | INTRAMUSCULAR | Status: DC | PRN
  Administered 2018-01-16: 25 ug via INTRAVENOUS

## 2018-01-16 MED ORDER — DEXAMETHASONE SODIUM PHOSPHATE 10 MG/ML IJ SOLN
INTRAMUSCULAR | Status: AC
  Filled 2018-01-16: qty 1

## 2018-01-16 MED ORDER — ROPIVACAINE HCL 2 MG/ML IJ SOLN
INTRAMUSCULAR | Status: AC
  Filled 2018-01-16: qty 10

## 2018-01-16 NOTE — Patient Instructions (Addendum)
Restart your Eliquis tomorrow after our office calls to check on you.  ____________________________________________________________________________________________  Post-procedure Information What to expect: Most procedures involve the use of a local anesthetic (numbing medicine), and a steroid (anti-inflammatory medicine).  The local anesthetics may cause temporary numbness and weakness of the legs or arms, depending on the location of the block. This numbness/weakness may last 4-6 hours, depending on the local anesthetic used. In rare instances, it can last up to 24 hours. While numb, you must be very careful not to injure the extremity.  After any procedure, you could expect the pain to get better within 15-20 minutes. This relief is temporary and may last 4-6 hours. Once the local anesthetics wears off, you could experience discomfort, possibly more than usual, for up to 10 (ten) days. In the case of radiofrequencies, it may last up to 6 weeks. Surgeries may take up to 8 weeks for the healing process. The discomfort is due to the irritation caused by needles going through skin and muscle. To minimize the discomfort, we recommend using ice the first day, and heat from then on. The ice should be applied for 15 minutes on, and 15 minutes off. Keep repeating this cycle until bedtime. Avoid applying the ice directly to the skin, to prevent frostbite. Heat should be used daily, until the pain improves (4-10 days). Be careful not to burn yourself.  Occasionally you may experience muscle spasms or cramps. These occur as a consequence of the irritation caused by the needle sticks to the muscle and the blood that will inevitably be lost into the surrounding muscle tissue. Blood tends to be very irritating to tissues, which tend to react by going into spasm. These spasms may start the same day of your procedure, but they may also take days to develop. This late onset type of spasm or cramp is usually caused by  electrolyte imbalances triggered by the steroids, at the level of the kidney. Cramps and spasms tend to respond well to muscle relaxants, multivitamins (some are triggered by the procedure, but may have their origins in vitamin deficiencies), and "Gatorade", or any sports drinks that can replenish any electrolyte imbalances. (If you are a diabetic, ask your pharmacist to get you a sugar-free brand.) Warm showers or baths may also be helpful. Stretching exercises are highly recommended.  General Instructions:  Be alert for signs of possible infection: redness, swelling, heat, red streaks, elevated temperature, and/or fever. These typically appear 4 to 6 days after the procedure. Immediately notify your doctor if you experience unusual bleeding, difficulty breathing, or loss of bowel or bladder control. If you experience increased pain, do not increase your pain medicine intake, unless instructed by your pain physician.  Post-Procedure Care:  Be careful in moving about. Muscle spasms in the area of the injection may occur. Applying ice or heat to the area is often helpful. The incidence of spinal headaches after epidural injections ranges between 1.4% and 6%. If you develop a headache that does not seem to respond to conservative therapy, please let your physician know. This can be treated with an epidural blood patch.   Post-procedure numbness or redness is to be expected, however it should average 4 to 6 hours. If numbness and weakness of your extremities begins to develop 4 to 6 hours after your procedure, and is felt to be progressing and worsening, immediately contact your physician.  Diet:  If you experience nausea, do not eat until this sensation goes away. If you had a "  Stellate Ganglion Block" for upper extremity "Reflex Sympathetic Dystrophy", do not eat or drink until your hoarseness goes away. In any case, always start with liquids first and if you tolerate them well, then slowly progress to  more solid foods.  Activity:  For the first 4 to 6 hours after the procedure, use caution in moving about as you may experience numbness and/or weakness. Use caution in cooking, using household electrical appliances, and climbing steps. If you need to reach your Doctor call our office: [phone] (During business hours) or [phone] (After business hours).  Business Hours: Monday-Thursday 8:00 am - 4:00 PM    Fridays: Closed     In case of an emergency: In case of emergency, call 911 or go to the nearest emergency room and have the physician there call us.  Interpretation of Procedure Every nerve block has two components: a diagnostic component, and a treatment component. Unrealistic expectations are the most common causes of "perceived failure".  In a perfect world, a single nerve block should be able to completely and permanently eliminate the pain. Sadly, the world is not perfect.  Most pain management nerve blocks are performed using local anesthetics and steroids. Steroids are responsible for any long-term benefit that you may experience. Their purpose is to decrease any chronic swelling that may exist in the area. Steroids begin to work immediately after being injected. However, most patients will not experience any benefits until 5 to 10 days after the injection, when the swelling has come down to the point where they can tell a difference. Steroids will only help if there is swelling to be treated. As such, they can assist with the diagnosis. If effective, they suggest an inflammatory component to the pain, and if ineffective, they rule out inflammation as the main cause or component of the problem. If the problem is one of mechanical compression, you will get no benefit from those steroids.   In the case of local anesthetics, they have a crucial role in the diagnosis of your condition. Most will begin to work within15 to 20 minutes after injection. The duration will depend on the  type used (short- vs. Long-acting). It is of outmost importance that patients keep tract of their pain, after the procedure. To assist with this matter, a "Post-procedure Pain Diary" is provided. Make sure to complete it and to bring it back to your follow-up appointment.  As long as the patient keeps accurate, detailed records of their symptoms after every procedure, and returns to have those interpreted, every procedure will provide [place] with invaluable information. Even a block that does not provide the patient with any relief, will always provide [place] with information about the mechanism and the origin of the pain. The only time a nerve block can be considered a waste of time is when patients do not keep track of the results, or do not keep their post-procedure appointment.  Reporting the results back to your physician The Pain Score  Pain is a subjective complaint. It cannot be seen, touched, or measured. We depend entirely on the patient's report of the pain in order to assess your condition and treatment. To evaluate the pain, we use a pain scale, where "0" means "No Pain", and a "10" is "the worst possible pain that you can even imagine" (i.e. something like been eaten alive by a shark or being torn apart by a lion).   Use the Pain Scale provided. You will frequently be asked to rate your  pain. Please be accurate, remember that medical decisions will be based on your responses. Please do not rate your pain above a 10. Doing so is actually interpreted as "symptom magnification" (exaggeration). To put this into perspective, when you tell us that your pain is at a 10 (ten), what you are saying is that there is nothing we can do to make this pain any worse. (Carefully think about that.) ____________________________________________________________________________________________   Pain Management Discharge Instructions  General Discharge Instructions :  If you need to reach your doctor call:  Monday-Friday 8:00 am - 4:00 pm at [phone] or toll free [phone].  After clinic hours [phone] to have operator reach doctor.  Bring all of your medication bottles to all your appointments in the pain clinic.  To cancel or reschedule your appointment with Pain Management please remember to call 24 hours in advance to avoid a fee.  Refer to the educational materials which you have been given on: General Risks, I had my Procedure. Discharge Instructions, Post Sedation.  Post Procedure Instructions:  Please notify your doctor immediately if you have any unusual bleeding, trouble breathing or pain that is not related to your normal pain.  Depending on the type of procedure that was done, some parts of your body may feel week and/or numb.  This usually clears up by tonight or the next day.  Walk with the use of an assistive device or accompanied by an adult for the 24 hours.  You may use ice on the affected area for the first 24 hours.  Put ice in a Ziploc bag and cover with a towel and place against area 15 minutes on 15 minutes off.  You may switch to heat after 24 hours.Post-procedure Information What to expect: Most procedures involve the use of a local anesthetic (numbing medicine), and a steroid (anti-inflammatory medicine).  The local anesthetics may cause temporary numbness and weakness of the legs or arms, depending on the location of the block. This numbness/weakness may last 4-6 hours, depending on the local anesthetic used. In rare instances, it can last up to 24 hours. While numb, you must be very careful not to injure the extremity.  After any procedure, you could expect the pain to get better within 15-20 minutes. This relief is temporary and may last 4-6 hours. Once the local anesthetics wears off, you could experience discomfort, possibly more than usual, for up to 10 (ten) days. In the case of radiofrequencies, it may last up to 6 weeks. Surgeries may take up to 8  weeks for the healing process. The discomfort is due to the irritation caused by needles going through skin and muscle. To minimize the discomfort, we recommend using ice the first day, and heat from then on. The ice should be applied for 15 minutes on, and 15 minutes off. Keep repeating this cycle until bedtime. Avoid applying the ice directly to the skin, to prevent frostbite. Heat should be used daily, until the pain improves (4-10 days). Be careful not to burn yourself.  Occasionally you may experience muscle spasms or cramps. These occur as a consequence of the irritation caused by the needle sticks to the muscle and the blood that will inevitably be lost into the surrounding muscle tissue. Blood tends to be very irritating to tissues, which tend to react by going into spasm. These spasms may start the same day of your procedure, but they may also take days to develop. This late onset type of spasm or cramp is usually  caused by electrolyte imbalances triggered by the steroids, at the level of the kidney. Cramps and spasms tend to respond well to muscle relaxants, multivitamins (some are triggered by the procedure, but may have their origins in vitamin deficiencies), and "Gatorade", or any sports drinks that can replenish any electrolyte imbalances. (If you are a diabetic, ask your pharmacist to get you a sugar-free brand.) Warm showers or baths may also be helpful. Stretching exercises are highly recommended. General Instructions:  Be alert for signs of possible infection: redness, swelling, heat, red streaks, elevated temperature, and/or fever. These typically appear 4 to 6 days after the procedure. Immediately notify your doctor if you experience unusual bleeding, difficulty breathing, or loss of bowel or bladder control. If you experience increased pain, do not increase your pain medicine intake, unless instructed by your pain physician. Post-Procedure Care:  Be careful in moving about. Muscle spasms  in the area of the injection may occur. Applying ice or heat to the area is often helpful. The incidence of spinal headaches after epidural injections ranges between 1.4% and 6%. If you develop a headache that does not seem to respond to conservative therapy, please let your physician know. This can be treated with an epidural blood patch.   Post-procedure numbness or redness is to be expected, however it should average 4 to 6 hours. If numbness and weakness of your extremities begins to develop 4 to 6 hours after your procedure, and is felt to be progressing and worsening, immediately contact your physician.   Diet:  If you experience nausea, do not eat until this sensation goes away. If you had a "Stellate Ganglion Block" for upper extremity "Reflex Sympathetic Dystrophy", do not eat or drink until your hoarseness goes away. In any case, always start with liquids first and if you tolerate them well, then slowly progress to more solid foods. Activity:  For the first 4 to 6 hours after the procedure, use caution in moving about as you may experience numbness and/or weakness. Use caution in cooking, using household electrical appliances, and climbing steps. If you need to reach your Doctor call our office: [phone]) [phone] Monday-Thursday 8:00 am - 4:00 PM    Fridays: Closed     In case of an emergency: In case of emergency, call 911 or go to the nearest emergency room and have the physician there call us.  Interpretation of Procedure Every nerve block has two components: a diagnostic component, and a treatment component. Unrealistic expectations are the most common causes of "perceived failure".  In a perfect world, a single nerve block should be able to completely and permanently eliminate the pain. Sadly, the world is not perfect.  Most pain management nerve blocks are performed using local anesthetics and steroids. Steroids are responsible for any long-term benefit that you may experience. Their  purpose is to decrease any chronic swelling that may exist in the area. Steroids begin to work immediately after being injected. However, most patients will not experience any benefits until 5 to 10 days after the injection, when the swelling has come down to the point where they can tell a difference. Steroids will only help if there is swelling to be treated. As such, they can assist with the diagnosis. If effective, they suggest an inflammatory component to the pain, and if ineffective, they rule out inflammation as the main cause or component of the problem. If the problem is one of mechanical compression, you will get no benefit from those steroids.  In the case of local anesthetics, they have a crucial role in the diagnosis of your condition. Most will begin to work within15 to 20 minutes after injection. The duration will depend on the type used (short- vs. Long-acting). It is of outmost importance that patients keep tract of their pain, after the procedure. To assist with this matter, a "Post-procedure Pain Diary" is provided. Make sure to complete it and to bring it back to your follow-up appointment.  As long as the patient keeps accurate, detailed records of their symptoms after every procedure, and returns to have those interpreted, every procedure will provide [place] with invaluable information. Even a block that does not provide the patient with any relief, will always provide [place] with information about the mechanism and the origin of the pain. The only time a nerve block can be considered a waste of time is when patients do not keep track of the results, or do not keep their post-procedure appointment.  Reporting the results back to your physician The Pain Score  Pain is a subjective complaint. It cannot be seen, touched, or measured. We depend entirely on the patient's report of the pain in order to assess your condition and treatment. To evaluate the pain, we use a pain scale, where "0" means  "No Pain", and a "10" is "the worst possible pain that you can even imagine" (i.e. something like been eaten alive by a shark or being torn apart by a lion).   You will frequently be asked to rate your pain. Please be as accurate, remember that medical decisions will be based on your responses. Please do not rate your pain above a 10. Doing so is actually interpreted as "symptom magnification" (exaggeration), as well as lack of understanding with regards to the scale. To put this into perspective, when you tell us that your pain is at a 10 (ten), what you are saying is that there is nothing we can do to make this pain any worse. (Carefully think about that.)

## 2018-01-16 NOTE — Progress Notes (Signed)
Safety precautions to be maintained throughout the outpatient stay will include: orient to surroundings, keep bed in low position, maintain call bell within reach at all times, provide assistance with transfer out of bed and ambulation.  

## 2018-01-16 NOTE — Progress Notes (Signed)
Patient's Name: Susan [name]  MRN: [id number]  Referring Provider: [name], MD  DOB: March 09, 1935  PCP: [name], MD  DOS: 01/16/2018  Note by: Gillis Santa, MD  Service setting: Ambulatory outpatient  Specialty: Interventional Pain Management  Patient type: Established  Location: [facility] (AMB) Pain Management Facility  Visit type: Interventional Procedure   Primary Reason for Visit: Interventional Pain Management Treatment. CC: Back Pain (lower)  Procedure:          Anesthesia, Analgesia, Anxiolysis:  Type: Thermal Lumbar Facet, Medial Branch Radiofrequency Ablation/Neurotomy           Primary Purpose: Therapeutic Region: Posterolateral Lumbosacral Spine Level:L3, L4, L5,  Medial Branch Level(s). These levels will denervate the L3-4, L4-5,  lumbar facet joints. Laterality: Left  Type: Moderate (Conscious) Sedation combined with Local Anesthesia Indication(s): Analgesia and Anxiety Route: Intravenous (IV) IV Access: Secured Sedation: Meaningful verbal contact was maintained at all times during the procedure  Local Anesthetic: Lidocaine 1-2%  Position: Prone   Indications: 1. Lumbar facet arthropathy   2. Spondylosis without myelopathy or radiculopathy, lumbar region    Susan [name] has been dealing with the above chronic pain for longer than three months and has either failed to respond, was unable to tolerate, or simply did not get enough benefit from other more conservative therapies including, but not limited to: 1. Over-the-counter medications 2. Anti-inflammatory medications 3. Muscle relaxants 4. Membrane stabilizers 5. Opioids 6. Physical therapy and/or chiropractic manipulation 7. Modalities (Heat, ice, etc.) 8. Invasive techniques such as nerve blocks. Susan [name] has attained more than 50% relief of the pain from a series of diagnostic injections conducted in separate occasions.  Pain Score: Pre-procedure: 3 /10 Post-procedure: 0-No pain/10  Pre-op  Assessment:  Susan [name] is a 83 y.o. (year old), female patient, seen today for interventional treatment. She  has a past surgical history that includes left heart catheterization with coronary angiogram (10/19/2012); percutaneous coronary stent intervention (pci-s) (10/19/2012); percutaneous coronary stent intervention (pci-s) (N/A, 10/23/2012); Abdominal hysterectomy; Cardiac catheterization (N/A, 05/05/2015); Breast excisional biopsy (Right, 1956); PORTA CATH REMOVAL (N/A, 04/05/2017); and Cardioversion (N/A, 07/27/2017). Susan [name] has a current medication list which includes the following prescription(s): acetaminophen, aspirin ec, atorvastatin, ezetimibe, fluticasone, furosemide, gabapentin, isosorbide mononitrate, loperamide, losartan, multivitamin with minerals, nitrostat, omeprazole, vitamin b-12, and calcium-magnesium-vitamin d, and the following Facility-Administered Medications: fentanyl, sodium chloride flush, and sodium chloride flush. Her primarily concern today is the Back Pain (lower)  Initial Vital Signs:  Pulse/HCG Rate: (!) 43ECG Heart Rate: (!) 54 Temp: 98 F (36.7 C) Resp: 16 BP: (!) 119/91 SpO2: 99 %  BMI: Estimated body mass index is 35.49 kg/m as calculated from the following:   Height as of this encounter: 5' 4.5" (1.638 m).   Weight as of this encounter: 210 lb (95.3 kg).  Risk Assessment: Allergies: Reviewed. She is allergic to ace inhibitors and penicillins.  Allergy Precautions: None required Coagulopathies: Reviewed. None identified.  Blood-thinner therapy: None at this time Active Infection(s): Reviewed. None identified. Susan [name] is afebrile  Site Confirmation: Susan [name] was asked to confirm the procedure and laterality before marking the site Procedure checklist: Completed Consent: Before the procedure and under the influence of no sedative(s), amnesic(s), or anxiolytics, the patient was informed of the treatment options, risks and possible complications.  To fulfill our ethical and legal obligations, as recommended by the American Medical Association's Code of Ethics, I have informed the patient of my clinical impression; the nature and purpose of the treatment or  procedure; the risks, benefits, and possible complications of the intervention; the alternatives, including doing nothing; the risk(s) and benefit(s) of the alternative treatment(s) or procedure(s); and the risk(s) and benefit(s) of doing nothing. The patient was provided information about the general risks and possible complications associated with the procedure. These may include, but are not limited to: failure to achieve desired goals, infection, bleeding, organ or nerve damage, allergic reactions, paralysis, and death. In addition, the patient was informed of those risks and complications associated to Spine-related procedures, such as failure to decrease pain; infection (i.e.: Meningitis, epidural or intraspinal abscess); bleeding (i.e.: epidural hematoma, subarachnoid hemorrhage, or any other type of intraspinal or peri-dural bleeding); organ or nerve damage (i.e.: Any type of peripheral nerve, nerve root, or spinal cord injury) with subsequent damage to sensory, motor, and/or autonomic systems, resulting in permanent pain, numbness, and/or weakness of one or several areas of the body; allergic reactions; (i.e.: anaphylactic reaction); and/or death. Furthermore, the patient was informed of those risks and complications associated with the medications. These include, but are not limited to: allergic reactions (i.e.: anaphylactic or anaphylactoid reaction(s)); adrenal axis suppression; blood sugar elevation that in diabetics may result in ketoacidosis or comma; water retention that in patients with history of congestive heart failure may result in shortness of breath, pulmonary edema, and decompensation with resultant heart failure; weight gain; swelling or edema; medication-induced neural toxicity;  particulate matter embolism and blood vessel occlusion with resultant organ, and/or nervous system infarction; and/or aseptic necrosis of one or more joints. Finally, the patient was informed that Medicine is not an exact science; therefore, there is also the possibility of unforeseen or unpredictable risks and/or possible complications that may result in a catastrophic outcome. The patient indicated having understood very clearly. We have given the patient no guarantees and we have made no promises. Enough time was given to the patient to ask questions, all of which were answered to the patient's satisfaction. Susan [name] has indicated that she wanted to continue with the procedure. Attestation: I, the ordering provider, attest that I have discussed with the patient the benefits, risks, side-effects, alternatives, likelihood of achieving goals, and potential problems during recovery for the procedure that I have provided informed consent. Date  Time: 01/16/2018  7:58 AM  Pre-Procedure Preparation:  Monitoring: As per clinic protocol. Respiration, ETCO2, SpO2, BP, heart rate and rhythm monitor placed and checked for adequate function Safety Precautions: Patient was assessed for positional comfort and pressure points before starting the procedure. Time-out: I initiated and conducted the "Time-out" before starting the procedure, as per protocol. The patient was asked to participate by confirming the accuracy of the "Time Out" information. Verification of the correct person, site, and procedure were performed and confirmed by me, the nursing staff, and the patient. "Time-out" conducted as per Joint Commission's Universal Protocol (UP.01.01.01). Time: 0839  Description of Procedure:          Laterality: Left Levels:  L3, L4, L5,  Medial Branch Level(s), at the L3-4, L4-5 lumbar facet joints. Area Prepped: Lumbosacral Prepping solution: ChloraPrep (2% chlorhexidine gluconate and 70% isopropyl  alcohol) Safety Precautions: Aspiration looking for blood return was conducted prior to all injections. At no point did we inject any substances, as a needle was being advanced. Before injecting, the patient was told to immediately notify me if she was experiencing any new onset of "ringing in the ears, or metallic taste in the mouth". No attempts were made at seeking any paresthesias. Safe injection practices and  needle disposal techniques used. Medications properly checked for expiration dates. SDV (single dose vial) medications used. After the completion of the procedure, all disposable equipment used was discarded in the proper designated medical waste containers. Local Anesthesia: Protocol guidelines were followed. The patient was positioned over the fluoroscopy table. The area was prepped in the usual manner. The time-out was completed. The target area was identified using fluoroscopy. A 12-in long, straight, sterile hemostat was used with fluoroscopic guidance to locate the targets for each level blocked. Once located, the skin was marked with an approved surgical skin marker. Once all sites were marked, the skin (epidermis, dermis, and hypodermis), as well as deeper tissues (fat, connective tissue and muscle) were infiltrated with a small amount of a short-acting local anesthetic, loaded on a 10cc syringe with a 25G, 1.5-in  Needle. An appropriate amount of time was allowed for local anesthetics to take effect before proceeding to the next step. Local Anesthetic: Lidocaine 2.0% The unused portion of the local anesthetic was discarded in the proper designated containers. Technical explanation of process:  Radiofrequency Ablation (RFA)  L3 Medial Branch Nerve RFA: The target area for the L3 medial branch is at the junction of the postero-lateral aspect of the superior articular process and the superior, posterior, and medial edge of the transverse process of L4. Under fluoroscopic guidance, a  Radiofrequency needle was inserted until contact was made with os over the superior postero-lateral aspect of the pedicular shadow (target area). Sensory and motor testing was conducted to properly adjust the position of the needle. Once satisfactory placement of the needle was achieved, the numbing solution was slowly injected after negative aspiration for blood. 2.0 mL of the nerve block solution was injected without difficulty or complication. After waiting for at least 3 minutes, the ablation was performed. Once completed, the needle was removed intact. L4 Medial Branch Nerve RFA: The target area for the L4 medial branch is at the junction of the postero-lateral aspect of the superior articular process and the superior, posterior, and medial edge of the transverse process of L5. Under fluoroscopic guidance, a Radiofrequency needle was inserted until contact was made with os over the superior postero-lateral aspect of the pedicular shadow (target area). Sensory and motor testing was conducted to properly adjust the position of the needle. Once satisfactory placement of the needle was achieved, the numbing solution was slowly injected after negative aspiration for blood. 2.0 mL of the nerve block solution was injected without difficulty or complication. After waiting for at least 3 minutes, the ablation was performed. Once completed, the needle was removed intact. L5 Medial Branch Nerve RFA: The target area for the L5 medial branch is at the junction of the postero-lateral aspect of the superior articular process of S1 and the superior, posterior, and medial edge of the sacral ala. Under fluoroscopic guidance, a Radiofrequency needle was inserted until contact was made with os over the superior postero-lateral aspect of the pedicular shadow (target area). Sensory and motor testing was conducted to properly adjust the position of the needle. Once satisfactory placement of the needle was achieved, the numbing  solution was slowly injected after negative aspiration for blood. 2.0 mL of the nerve block solution was injected without difficulty or complication. After waiting for at least 3 minutes, the ablation was performed. Once completed, the needle was removed intact.  Radiofrequency Generator: NeuroTherm [id number] Sensory Stimulation Parameters: 50 Hz was used to locate & identify the nerve, making sure that the needle was positioned such  that there was no sensory stimulation below 0.3 V or above 0.7 V. Motor Stimulation Parameters: 2 Hz was used to evaluate the motor component. Care was taken not to lesion any nerves that demonstrated motor stimulation of the lower extremities at an output of less than 2.5 times that of the sensory threshold, or a maximum of 2.0 V. Lesioning Technique Parameters: Standard Radiofrequency settings. (Not bipolar or pulsed.) Temperature Settings: 80 degrees C Lesioning time: 60 seconds Intra-operative Compliance: Compliant Materials & Medications: Needle(s) (Electrode/Cannula) Type: Teflon-coated, curved tip, Radiofrequency needle(s) Gauge: 22G Length: 10cm Numbing solution: 6 cc solution made of 5 cc of 0.2% Ropivacaine and 1 cc of Decadron 10 mg/cc, 2 cc injected at each level as above.   Once the entire procedure was completed, the treated area was cleaned, making sure to leave some of the prepping solution back to take advantage of its long term bactericidal properties.  Illustration of the posterior view of the lumbar spine and the posterior neural structures. Laminae of L2 through S1 are labeled. DPRL5, dorsal primary ramus of L5; DPRS1, dorsal primary ramus of S1; DPR3, dorsal primary ramus of L3; FJ, facet (zygapophyseal) joint L3-L4; I, inferior articular process of L4; LB1, lateral branch of dorsal primary ramus of L1; IAB, inferior articular branches from L3 medial branch (supplies L4-L5 facet joint); IBP, intermediate branch plexus; MB3, medial branch of dorsal  primary ramus of L3; NR3, third lumbar nerve root; S, superior articular process of L5; SAB, superior articular branches from L4 (supplies L4-5 facet joint also); TP3, transverse process of L3.  Vitals:   01/16/18 0903 01/16/18 0912 01/16/18 0922 01/16/18 0931  BP: 127/71 111/77 127/76 (!) 126/58  Pulse:      Resp: 10 12 14 17   Temp:  98 F (36.7 C)    TempSrc:      SpO2: 99% 99% 100% 99%  Weight:      Height:        Start Time: 0839 hrs. End Time: 0903 hrs.  Imaging Guidance (Spinal):          Type of Imaging Technique: Fluoroscopy Guidance (Spinal) Indication(s): Assistance in needle guidance and placement for procedures requiring needle placement in or near specific anatomical locations not easily accessible without such assistance. Exposure Time: Please see nurses notes. Contrast: None used. Fluoroscopic Guidance: I was personally present during the use of fluoroscopy. "Tunnel Vision Technique" used to obtain the best possible view of the target area. Parallax error corrected before commencing the procedure. "Direction-depth-direction" technique used to introduce the needle under continuous pulsed fluoroscopy. Once target was reached, antero-posterior, oblique, and lateral fluoroscopic projection used confirm needle placement in all planes. Images permanently stored in EMR. Interpretation: No contrast injected. I personally interpreted the imaging intraoperatively. Adequate needle placement confirmed in multiple planes. Permanent images saved into the patient's record.  Antibiotic Prophylaxis:   Anti-infectives (From admission, onward)   None     Indication(s): None identified  Post-operative Assessment:  Post-procedure Vital Signs:  Pulse/HCG Rate: (!) 43(!) 53 Temp: 98 F (36.7 C) Resp: 17 BP: (!) 126/58 SpO2: 99 %  EBL: None  Complications: No immediate post-treatment complications observed by team, or reported by patient.  Note: The patient tolerated the entire  procedure well. A repeat set of vitals were taken after the procedure and the patient was kept under observation following institutional policy, for this type of procedure. Post-procedural neurological assessment was performed, showing return to baseline, prior to discharge. The patient was provided with post-procedure  discharge instructions, including a section on how to identify potential problems. Should any problems arise concerning this procedure, the patient was given instructions to immediately contact us, at any time, without hesitation. In any case, we plan to contact the patient by telephone for a follow-up status report regarding this interventional procedure.  Comments:  No additional relevant information. 5 out of 5 strength bilateral lower extremity: Plantar flexion, dorsiflexion, knee flexion, knee extension.  Plan of Care    Imaging Orders     DG C-Arm 1-60 Min-No Report  Procedure Orders     Radiofrequency,Lumbar  Medications ordered for procedure: Meds ordered this encounter  Medications  . lactated ringers infusion 1,000 mL  . fentaNYL (SUBLIMAZE) injection 25-100 mcg    Make sure Narcan is available in the pyxis when using this medication. In the event of respiratory depression (RR< 8/min): Titrate NARCAN (naloxone) in increments of 0.1 to 0.2 mg IV at 2-3 minute intervals, until desired degree of reversal.  . ropivacaine (PF) 2 mg/mL (0.2%) (NAROPIN) injection 10 mL  . lidocaine (XYLOCAINE) 2 % (with pres) injection 400 mg  . dexamethasone (DECADRON) injection 10 mg   Medications administered: We administered lactated ringers, fentaNYL, ropivacaine (PF) 2 mg/mL (0.2%), lidocaine, and dexamethasone.  See the medical record for exact dosing, route, and time of administration.  Disposition: Discharge home  Discharge Date & Time: 01/16/2018; 0932 hrs.   Physician-requested Follow-up: Return in about 2 weeks (around 01/30/2018) for Contra-lateral RFA.  Future  Appointments  Date Time Provider [place]  01/21/2018  3:15 PM Bjorn Loser, MD [facility] None  02/20/2018  2:00 PM [facility] None  03/15/2018  2:00 PM [facility] None  03/15/2018  2:30 PM Lequita Asal, MD [facility] None  12/18/2018  1:30 PM Noreene Filbert, MD [facility] None   Primary Care Physician: [name], MD Location: [facility] Note by: Gillis Santa, MD Date: 01/16/2018; Time: 10:15 AM  Disclaimer:  Medicine is not an exact science. The only guarantee in medicine is that nothing is guaranteed. It is important to note that the decision to proceed with this intervention was based on the information collected from the patient. The Data and conclusions were drawn from the patient's questionnaire, the interview, and the physical examination. Because the information was provided in large part by the patient, it cannot be guaranteed that it has not been purposely or unconsciously manipulated. Every effort has been made to obtain as much relevant data as possible for this evaluation. It is important to note that the conclusions that lead to this procedure are derived in large part from the available data. Always take into account that the treatment will also be dependent on availability of resources and existing treatment guidelines, considered by other Pain Management Practitioners as being common knowledge and practice, at the time of the intervention. For Medico-Legal purposes, it is also important to point out that variation in procedural techniques and pharmacological choices are the acceptable norm. The indications, contraindications, technique, and results of the above procedure should only be interpreted and judged by a Board-Certified Interventional Pain Specialist with extensive familiarity and expertise in the same exact procedure and technique.

## 2018-01-17 ENCOUNTER — Other Ambulatory Visit: Payer: Self-pay | Admitting: Cardiovascular Disease

## 2018-01-21 ENCOUNTER — Ambulatory Visit: Payer: Medicare Other | Admitting: Urology

## 2018-01-21 ENCOUNTER — Encounter: Payer: Self-pay | Admitting: Urology

## 2018-01-21 VITALS — BP 119/75 | HR 66 | Ht 64.0 in | Wt 210.0 lb

## 2018-01-21 DIAGNOSIS — N3946 Mixed incontinence: Secondary | ICD-10-CM

## 2018-01-21 MED ORDER — OXYBUTYNIN CHLORIDE ER 15 MG PO TB24: 15.0000 mg | ORAL_TABLET | Freq: Every day | ORAL | 11 refills | Status: DC
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
## 2018-01-23 ENCOUNTER — Other Ambulatory Visit: Payer: Self-pay | Admitting: Cardiovascular Disease

## 2018-01-29 ENCOUNTER — Telehealth: Payer: Self-pay | Admitting: Cardiovascular Disease

## 2018-01-29 ENCOUNTER — Other Ambulatory Visit: Payer: Self-pay | Admitting: Cardiovascular Disease

## 2018-01-29 MED ORDER — APIXABAN 5 MG PO TABS: 5.0000 mg | ORAL_TABLET | Freq: Two times a day (BID) | ORAL | 1 refills | Status: DC

## 2018-01-29 NOTE — Telephone Encounter (Signed)
Encounter not needed

## 2018-01-29 NOTE — Telephone Encounter (Signed)
°*  STAT* If patient is at the pharmacy, call can be transferred to refill team.   1. Which medications need to be refilled? (please list name of each medication and dose if known) eliquis 5 mg  2. Which pharmacy/location (including street and city if local pharmacy) is medication to be sent to?[place]   3. Do they need a 30 day or 90 day supply? [place]

## 2018-02-20 ENCOUNTER — Inpatient Hospital Stay: Payer: Medicare Other

## 2018-02-20 ENCOUNTER — Inpatient Hospital Stay: Payer: Medicare Other | Attending: Obstetrics and Gynecology | Admitting: Obstetrics and Gynecology

## 2018-02-20 ENCOUNTER — Other Ambulatory Visit: Payer: Self-pay | Admitting: Nurse Practitioner

## 2018-02-20 ENCOUNTER — Other Ambulatory Visit
Admission: RE | Admit: 2018-02-20 | Discharge: 2018-02-20 | Disposition: A | Discharge status: Home / Self Care | Payer: Medicare Other | Source: Ambulatory Visit | Attending: Obstetrics and Gynecology | Admitting: Obstetrics and Gynecology

## 2018-02-20 ENCOUNTER — Other Ambulatory Visit: Payer: Self-pay

## 2018-02-20 VITALS — BP 124/71 | HR 57 | Temp 97.6°F | Resp 18 | Ht 64.0 in | Wt 211.7 lb

## 2018-02-20 DIAGNOSIS — Z90710 Acquired absence of both cervix and uterus: Secondary | ICD-10-CM | POA: Diagnosis not present

## 2018-02-20 DIAGNOSIS — R300 Dysuria: Secondary | ICD-10-CM | POA: Insufficient documentation

## 2018-02-20 DIAGNOSIS — Z90722 Acquired absence of ovaries, bilateral: Secondary | ICD-10-CM

## 2018-02-20 DIAGNOSIS — C541 Malignant neoplasm of endometrium: Secondary | ICD-10-CM | POA: Insufficient documentation

## 2018-02-20 LAB — URINALYSIS, COMPLETE (UACMP) WITH MICROSCOPIC
BACTERIA UA: NONE SEEN
Bilirubin Urine: NEGATIVE
Glucose, UA: NEGATIVE mg/dL
KETONES UR: NEGATIVE mg/dL
Nitrite: NEGATIVE
PROTEIN: 30 mg/dL — AB
Specific Gravity, Urine: 1.013 (ref 1.005–1.030)
pH: 5 (ref 5.0–8.0)

## 2018-02-20 MED ORDER — NITROFURANTOIN MONOHYD MACRO 100 MG PO CAPS: 100.0000 mg | ORAL_CAPSULE | Freq: Two times a day (BID) | ORAL | 0 refills | Status: AC

## 2018-02-20 NOTE — Progress Notes (Signed)
UA consistent with UTI. Prescription for Macrobid sent to pharmacy and discussed with patient.

## 2018-02-20 NOTE — Progress Notes (Addendum)
Gynecologic Oncology Interval Visit   Referring Provider: Dr Joylene Igo  Chief Concern: stage IIIC1serous endometrial cancer  Subjective:  Susan [name] is a 83 y.o., P3 female, initially seen in consultation from Dr. 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   Today, she reports continued fatigue, back pain, leg swelling, and numbness and tingling. These are chronic problems from radiation. Seeing a pain specialist and recently had RFA procedure for back pain.  Continues to teach water aerobics courses.  She is concerned she has a UTI and requests to be checked today.   CT C/A/P 12/22/17 IMPRESSION: 1. No acute cardio pulmonary abnormalities. 2. Stable appearance of small pulmonary nodules compared with 12/05/2016. The largest nodule measures 6 mm. Future CT at 18-24 months (from 12/05/2016) is considered optional for low-risk patients, but is recommended for high-risk patients. This recommendation follows the consensus statement: Guidelines for Management of Incidental Pulmonary Nodules Detected on CT Images: From the Fleischner Society 2017; Radiology 2017; 284:228-243. 3. Aortic atherosclerosis and 3 vessel coronary artery atherosclerotic calcifications.  Gynecologic Oncology  Susan [name] is a 83 y.o. 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  Please see complete details in prior notes.   Path report below shows stage IIIC1 disease.  Washings were negative, but she had 3 positive SLNs in pelvis.  An aortic SLN was negative.     A. Lymph node, sentinel, right external iliac, excision: Minute focus of metastatic carcinoma involving one lymph node (1/1). See note. Size of metastasis: Less  than 0.1 mm. Negative for extranodal invasion.  Note: A cytokeratin AE1/3 highlights the metastatic focus.  B. Lymph node, sentinel, right external iliac #2, excision: Metastatic carcinoma involving one lymph node (1/1). See note. Size of metastasis: 0.5 mm. Negative for extranodal invasion. Note: A cytokeratin AE1/3 highlights the metastatic focus.  C. Lymph node, sentinel, right aortic, excision: One benign lymph node (0/1). Note: A cytokeratin AE1/3 immunostain is negative.  D. Lymph node, sentinel, left external iliac, excision:  Metastatic adenocarcinoma involving one lymph node (1/1).   Size of metastasis: 1 cm. Negative for extranodal invasion. Note: A cytokeratin AE1/3 highlights the metastatic focus.  E. 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  Tumor size: 5.1 cm. The tumor invades 2 mm into a 14 mm thick myometrium. Negative for cervical involvement. Lymphovascular invasion is present.  04/28/2015 CA125 82.5   Recommendation to proceed with with carboplatinum and paclitaxel three-four cycles followed by radiation therapy followed by additional 2-3 cycles of chemotherapy. She started on chemotherapy on 05/12/2015 and received 2nd cycle on 06/02/2015. She developed significant neuropathy impairing her quality of life with complaints of sensory ataxia, frequent falls, and needs to walk with the help of walker. Chemotherapy was discontinued. Then received 4500 cGy pelvic radiation and 1200 cGy using high dose rate remote afterloading through vaginal cylinder completed 7/17.  08/31/2016 Abdominal and pelvic CT scan revealed no evidence of recurrent disease.  There was a new sacral insufficiency fracture.  She was referred to surgery.  Scans revealed a 3 mm pulmonary nodule.  Repeat chest CT in 3 months was recommended.  Pelvic MRI on  10/23/2016 revealed bilateral sacral insufficiency fractures with  possible insufficiency fractures of the left greater than right bilateral iliac wings.  Lumbar spine MRI revealed diffuse degenerative disease.  Decision was made not to perform a sacral plasty.  She was seen by Dr. Deetta Perla on 11/07/2016.  Surgical decompression or fusion was not recommended. Recommendation was made to the pain clinic for treatment (medication, injections, or spinal cord stimulator). She is currently seeing a chiropractor and has had 6 treatments.  Chest CT on 12/05/2016 revealed scattered right lung nodules measuring up to 6 mm (mean diameter) in the right upper lobe along the minor fissure.  Metastatic disease was not excluded.  Recommendation was for follow-up CT in 3-6 months.   CA125 values 04/28/2015 82.5  06/02/2015 22.0 07/14/2015 18.2 10/21/2015 11.7 01/21/2016 16.4 04/25/2016 14.2 08/15/2016 13.6 12/07/2016  16.2 03/08/2017 16.1  Seen in clinic on 02/21/2017 by Dr. Theora Gianotti with negative exam.   She has seen Dr. Mike Gip for routine follow-up and had a negative exam. She has had her port removed. She continues to be followed by cardiology for bradycardia. She declined a pacemaker at that time for asymptomatic bradycardia. She developed atrial fibrillation and underwent electrical cardioversion on 07/27/17. She has known lung nodules which showed:   03/06/2017 CT Chest WO Contrast: IMPRESSION: Stable tiny sub-cm right lung nodules, probably benign. Recommend continued followup by chest CT in 6 months.  She had a prior history of urinary incontinence and was seen by Dr. Matilde Sprang, Urology on 08/13/17. They discussed percutaneous tibial nerve stimulation.    Problem List: Patient Active Problem List   Diagnosis Date Noted  . Persistent atrial fibrillation   . Port-A-Cath in place 03/28/2017  . Bradycardia 01/18/2017  . Obesity (BMI 30-39.9) 01/17/2017  . Pulmonary nodules 12/05/2016  . Sacral  insufficiency fracture 09/05/2016  . Absence of bladder continence 07/21/2015  . Neuropathy associated with cancer ([place]) 06/02/2015  . Carcinoma of endometrium ([place]) 04/02/2015  . Gastro-esophageal reflux disease without esophagitis 02/15/2015  . Osteopenia 08/17/2014  . Kidney stones 10/27/2013  . CA skin, basal cell 06/30/2013  . Lumbar canal stenosis 06/30/2013  . Arthritis, degenerative 06/30/2013  . Neuralgia neuritis, sciatic nerve 06/30/2013  . Coronary artery disease 10/25/2012  . Dyslipidemia 10/25/2012  . NSVT (nonsustained ventricular tachycardia) ([place]) 10/25/2012  . Sick sinus syndrome ([place]) 10/25/2012  . Ischemic cardiomyopathy 10/22/2012  . Old anterior wall myocardial infarction 10/20/2012  . Hypertensive heart disease without CHF 10/20/2012    Past Medical History: Past Medical History:  Diagnosis Date  . Bradycardia   . Chronic back pain   . Coronary artery disease    a. s/p STEMI in 2014 with DES to LAD with staged PCI/DES placement to LCx  . Echoencephalogram abnormality   . Heart murmur   . History of Holter monitoring   . Hyperlipidemia   . Hypertension   . Myocardial infarction ([place])   . Neuromuscular disorder ([facility])    neuropathy  . NSVT (nonsustained ventricular tachycardia) ([place])   . Personal history of chemotherapy    ENDOMETRIAL CA  . Personal history of radiation therapy    ENDOMETRIAL CA  . Uterine cancer ([place])    serrous    Past Surgical History: Past Surgical History:  Procedure Laterality Date  . ABDOMINAL HYSTERECTOMY    . BREAST EXCISIONAL BIOPSY Right 1956   NEG  . CARDIOVERSION N/A 07/27/2017   Procedure: CARDIOVERSION;  Surgeon: Skeet Latch, MD;  Location: [facility];  Service: Cardiovascular;  Laterality: N/A;  . LEFT HEART CATHETERIZATION WITH CORONARY  ANGIOGRAM  10/19/2012   Procedure: LEFT HEART CATHETERIZATION WITH CORONARY ANGIOGRAM;  Surgeon: Troy Sine, MD;  Location: [facility] CATH LAB;  Service: Cardiovascular;;  .  PERCUTANEOUS CORONARY STENT INTERVENTION (PCI-S)  10/19/2012   Procedure: PERCUTANEOUS CORONARY STENT INTERVENTION (PCI-S);  Surgeon: Troy Sine, MD;  Location: [facility] CATH LAB;  Service: Cardiovascular;;  . PERCUTANEOUS CORONARY STENT INTERVENTION (PCI-S) N/A 10/23/2012   Procedure: PERCUTANEOUS CORONARY STENT INTERVENTION (PCI-S);  Surgeon: Troy Sine, MD;  Location: [facility] CATH LAB;  Service: Cardiovascular;  Laterality: N/A;  . PERIPHERAL VASCULAR CATHETERIZATION N/A 05/05/2015   Procedure: Glori Luis Cath Insertion;  Surgeon: Algernon Huxley, MD;  Location: [facility] CV LAB;  Service: Cardiovascular;  Laterality: N/A;  . PORTA CATH REMOVAL N/A 04/05/2017   Procedure: PORTA CATH REMOVAL;  Surgeon: Algernon Huxley, MD;  Location: [facility] CV LAB;  Service: Cardiovascular;  Laterality: N/A;    Family History: Family History  Problem Relation Age of Onset  . Heart disease Mother   . Breast cancer Mother 39  . Heart disease Father   . Diabetes Father   . Diabetes Brother   . Stroke Brother   . Kidney cancer Neg Hx   . Bladder Cancer Neg Hx     Social History: Social History   Socioeconomic History  . Marital status: Married    Spouse name: Not on file  . Number of children: Not on file  . Years of education: Not on file  . Highest education level: Not on file  Occupational History  . Not on file  Social Needs  . Financial resource strain: Not on file  . Food insecurity:    Worry: Not on file    Inability: Not on file  . Transportation needs:    Medical: Not on file    Non-medical: Not on file  Tobacco Use  . Smoking status: Former Smoker    Last attempt to quit: 1976    Years since quitting: 43.9  . Smokeless tobacco: Never Used  Substance and Sexual Activity  . Alcohol use: Yes    Alcohol/week: 1.0 standard drinks    Types: 1 Glasses of wine per week  . Drug use: No  . Sexual activity: Yes  Lifestyle  . Physical activity:    Days per week: Not on file    Minutes per  session: Not on file  . Stress: Not on file  Relationships  . Social connections:    Talks on phone: Not on file    Gets together: Not on file    Attends religious service: Not on file    Active member of club or organization: Not on file    Attends meetings of clubs or organizations: Not on file    Relationship status: Not on file  . Intimate partner violence:    Fear of current or ex partner: Not on file    Emotionally abused: Not on file    Physically abused: Not on file    Forced sexual activity: Not on file  Other Topics Concern  . Not on file  Social History Narrative  . Not on file    Allergies: Allergies  Allergen Reactions  . Ace Inhibitors Cough  . Penicillins Rash and Other (See Comments)    Has patient had a PCN reaction causing immediate rash, facial/tongue/throat swelling, SOB or lightheadedness with hypotension: No Has patient had a PCN reaction causing severe rash involving mucus membranes or skin necrosis: No Has patient had a  PCN reaction that required hospitalization: No Has patient had a PCN reaction occurring within the last 10 years: No If all of the above answers are "NO", then may proceed with Cephalosporin use.     Current Medications: Current Outpatient Medications  Medication Sig Dispense Refill  . acetaminophen (TYLENOL) 650 MG CR tablet Take 650 mg by mouth every 8 (eight) hours as needed for pain.    [name] apixaban (ELIQUIS) 5 MG TABS tablet Take 1 tablet (5 mg total) by mouth 2 (two) times daily. 180 tablet 1  . aspirin EC 81 MG tablet Take 81 mg by mouth daily.    [name] atorvastatin (LIPITOR) 80 MG tablet TAKE 1 TABLET (80 MG TOTAL) BY MOUTH DAILY AT 6 PM. 90 tablet 1  . Calcium-Magnesium-Vitamin D (CALCIUM 1200+D3 PO) Take 1 tablet by mouth daily.     [name] ezetimibe (ZETIA) 10 MG tablet Take 1 tablet (10 mg total) by mouth daily. 30 tablet 0  . fluticasone (FLONASE) 50 MCG/ACT nasal spray SPRAY 2 SPRAYS INTO EACH NOSTRIL EVERY DAY    . furosemide  (LASIX) 20 MG tablet Take 20 mg by mouth daily as needed for fluid.    [name] gabapentin (NEURONTIN) 300 MG capsule Take 1 capsule (300 mg total) by mouth 3 (three) times daily. 90 capsule 2  . isosorbide mononitrate (IMDUR) 30 MG 24 hr tablet TAKE 1 TABLET (30 MG TOTAL) BY MOUTH DAILY. 90 tablet 1  . loperamide (IMODIUM A-D) 2 MG tablet Take 2 mg by mouth 4 (four) times daily as needed for diarrhea or loose stools.    [name] losartan (COZAAR) 50 MG tablet TAKE 1 TABLET BY MOUTH EVERY DAY 90 tablet 1  . Multiple Vitamin (MULTIVITAMIN WITH MINERALS) TABS Take 1 tablet by mouth daily.    [name] NITROSTAT 0.4 MG SL tablet PLACE 1 TABLET UNDER THE TOUGUE EVERY 5 MINUTES FOR UP TO 3 DOSES AS NEEDED FOR CHEST PAIN 25 tablet 0  . omeprazole (PRILOSEC) 20 MG capsule Take 20 mg by mouth daily.    [name] oxybutynin (DITROPAN XL) 15 MG 24 hr tablet Take 1 tablet (15 mg total) by mouth daily. 30 tablet 11  . vitamin B-12 (CYANOCOBALAMIN) 1000 MCG tablet Take 1,000 mcg by mouth daily.     No current facility-administered medications for this visit.    Facility-Administered Medications Ordered in Other Visits  Medication Dose Route Frequency Provider Last Rate Last Dose  . sodium chloride flush (NS) 0.9 % injection 10 mL  10 mL Intravenous PRN Cammie Sickle, MD   10 mL at 06/06/16 1447  . sodium chloride flush (NS) 0.9 % injection 10 mL  10 mL Intravenous PRN Cammie Sickle, MD   10 mL at 08/15/16 1404   Review of Systems General:  fatigue Skin: no complaints Eyes: no complaints HEENT: no complaints Breasts: no complaints Pulmonary: DOE. No productive cough Cardiac: no complaints Gastrointestinal: no complaints Genitourinary/Sexual: no complaints Ob/Gyn: no complaints Musculoskeletal: back pain and leg swelling unchanged and chronic Hematology: no complaints Neurologic/Psych: peripheral neuropathy   Objective:  Physical Examination:  BP 124/71 (BP Location: Right Arm, Patient Position: Sitting)    Pulse (!) 57   Temp 97.6 F (36.4 C) (Tympanic)   Resp 18   Ht 5\' 4"  (1.626 m)   Wt 211 lb 11.2 oz (96 kg)   SpO2 96%   BMI 36.34 kg/m    ECOG Performance Status: 1 - Symptomatic but completely ambulatory  GENERAL: Patient is a well appearing  female in no acute distress HEENT:  Sclerae anicteric.  Oropharynx clear and moist. No ulcerations or evidence of oropharyngeal candidiasis. Neck is supple.  NODES:  No cervical, supraclavicular, or axillary lymphadenopathy palpated.  LUNGS:  Clear to auscultation bilaterally.  No wheezes or rhonchi. HEART:  Regular rate and rhythm. No murmur appreciated. ABDOMEN:  Soft, nontender.  Positive, normoactive bowel sounds. No organomegaly palpated. MSK:  No focal spinal tenderness to palpation. Full range of motion bilaterally in the upper extremities. Ambulates with Cane EXTREMITIES:  No peripheral edema.   SKIN:  Clear with no obvious rashes or skin changes. No nail dyscrasia. NEURO:  Nonfocal. Well oriented.  Appropriate affect. BREAST: Breasts appear normal, no suspicious masses, no skin or nipple changes or axillary nodes  Pelvic: Exam Chaperoned by RN. Vulva: normal appearing vulva with no masses, tenderness or lesions; Vagina: normal vagina; no lesions, vaginal shortening; BME: no masses; Uterus/Cervix:surgically absent; Rectal: confirmatory   Assessment:  Susan [name] is a 83 y.o. 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 NED today on exam and recent CA125 normal.  CT scan 9/19 normal and no change in indeterminate lung lesions.    Body mass index is 36.34 kg/m.  Urinary incontinence that is better with oxybutin.  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  Will refer to neurology for continued management.   Back pain: will continue with her current treating providers.   Ischemic heart disease with cornonary stents, bradycardia requiring recent admission.   Plan:  She will continue follow up with Dr. Mike Gip at the [facility]. Will plan to get CA 125 at next visit with Dr. Mike Gip.     She will continue to follow up with Urology, Cardiology, and her other care providers regarding her medical co morbidities.   Addendum: UA today large WBCs and blood, so will do culture and call in Macrobid Rx.    Beckey Rutter, DNP, AGNP-C [place] at [facility] [phone] (work cell) [phone] (office) 02/20/18 9:35 AM  Cc: Dr. Mike Gip  Medical screening examination/treatment/procedure(s) were performed by non-physician practitioner and as supervising physician I was immediately available for consultation/collaboration. Mellody Drown, MD

## 2018-02-20 NOTE — Progress Notes (Signed)
No new Gyn changes noted today 

## 2018-02-23 LAB — URINE CULTURE: Culture: >=100000 — AB

## 2018-03-01 ENCOUNTER — Inpatient Hospital Stay: Payer: Medicare Other | Attending: Nurse Practitioner | Admitting: Nurse Practitioner

## 2018-03-01 VITALS — BP 126/66 | HR 62 | Temp 96.8°F | Resp 20 | Wt 211.0 lb

## 2018-03-01 DIAGNOSIS — G620 Drug-induced polyneuropathy: Secondary | ICD-10-CM

## 2018-03-01 DIAGNOSIS — Z79899 Other long term (current) drug therapy: Secondary | ICD-10-CM | POA: Diagnosis not present

## 2018-03-01 DIAGNOSIS — Z7982 Long term (current) use of aspirin: Secondary | ICD-10-CM | POA: Diagnosis not present

## 2018-03-01 DIAGNOSIS — I252 Old myocardial infarction: Secondary | ICD-10-CM

## 2018-03-01 DIAGNOSIS — I10 Essential (primary) hypertension: Secondary | ICD-10-CM | POA: Diagnosis not present

## 2018-03-01 DIAGNOSIS — Z8542 Personal history of malignant neoplasm of other parts of uterus: Secondary | ICD-10-CM | POA: Diagnosis not present

## 2018-03-01 DIAGNOSIS — C541 Malignant neoplasm of endometrium: Secondary | ICD-10-CM

## 2018-03-01 DIAGNOSIS — M7989 Other specified soft tissue disorders: Secondary | ICD-10-CM

## 2018-03-01 DIAGNOSIS — T451X5D Adverse effect of antineoplastic and immunosuppressive drugs, subsequent encounter: Secondary | ICD-10-CM | POA: Insufficient documentation

## 2018-03-01 DIAGNOSIS — Z87891 Personal history of nicotine dependence: Secondary | ICD-10-CM | POA: Diagnosis not present

## 2018-03-01 DIAGNOSIS — T451X5A Adverse effect of antineoplastic and immunosuppressive drugs, initial encounter: Secondary | ICD-10-CM

## 2018-03-01 DIAGNOSIS — Z7901 Long term (current) use of anticoagulants: Secondary | ICD-10-CM

## 2018-03-01 NOTE — Patient Instructions (Signed)
As we discussed, you may notice some improvement in your symptoms with acupuncture. Below is the names of some local clinics that offer our cancer survivors a discounted rate of $40/session. You can contact them for an appointment at your convenience.  - [place]) or [phone] [facility])  You can try the capsaicin otc cream mixed with lotion on your feet for numbness.   Please schedule appointment with your neurologist for nerve conduction studies and Dr. Holley Raring for your back pain.   Consider a medical supply store to be fitted for compression stockings for lower extremity edema or [employer] in [place], [place].   It was a pleasure seeing you today and thank you for allowing me to participate in your care. -Beckey Rutter, NP

## 2018-03-01 NOTE — Progress Notes (Signed)
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 Pathology consistent with stage III C1 disease.  Washings were negative but she had 3 positive SLNs in pelvis. An aortic SLN was negative.  Tumor size was 5.1 cm invading 44mm of 14 mm thick myometrium.  Negative for cervical involvement.  LVSI present.  CA 125 has been followed:  04/28/15  82.5 06/02/2015  22.0 07/14/2015  18.2 10/21/2015  11.7 01/21/2016  16.4 04/25/2016  14.2 08/15/2016  13.6 12/07/2016   16.2 03/08/2017  16.1 09/13/2017 13.8  Interval imaging and exams have shown no evidence of recurrent or metastatic disease.   Oncology History   Endometrial cancer stage IIIc      Carcinoma of endometrium ([place])   04/02/2015 Initial Diagnosis    Carcinoma of endometrium ([place])     Interval history- Susan [name], is 83 year old female with above history of stage IIIC endometrial cancer who [facility] for peripheral neuropathy. She states symptoms started while receiving chemotherapy and have persisted since that time.  She states that chemotherapy was discontinued after 2 cycles due to her symptoms.  She describes her symptoms as numbness of feet extending up to her ankles.  Describes them as feeling asleep, and numb.  She states  symptoms have not worsened or improved since onset but interfere with her ADLs and have caused falls previously. She wears 'good shoes' and regularly checks her feet for injury.  She has stopped driving due to neuropathy.  Associated signs symptoms: urinary incontinence (followed by Dr. Matilde Sprang), back pain (followed by Dr. Holley Raring).  She was previously prescribed gabapentin for her neuropathy which she says 'didn't help' so this was discontinued. She has previously participated in PT d/t her symptoms but none recently. She continues to teach water aerobics. Uses a cane but has a walker at home.   She also complains of leg swelling which is chronic and unchanged. She says this improves with ambulation but ambulation is difficult d/t back pain and neuropathy. She was previously prescribed lasix but discontinued this d/t frequent urination. She was recommended to wear compression stockings but has not done so.   ECOG FS:2 - Symptomatic, <50% confined to bed  Review of systems- Review of Systems  Constitutional: Negative.   HENT: Negative.   Eyes: Negative.   Respiratory: Negative.   Cardiovascular: Positive for leg swelling. Negative for chest pain, palpitations, orthopnea and claudication.  Gastrointestinal: Negative.   Musculoskeletal: Negative.   Skin: Negative.   Neurological: Positive for sensory change.  Endo/Heme/Allergies: Negative.   Psychiatric/Behavioral: Negative.     Current treatment- surveillance  Allergies  Allergen Reactions  . Ace Inhibitors Cough  . Penicillins Rash and Other (See Comments)    Has patient had a PCN reaction causing immediate rash, facial/tongue/throat swelling, SOB or lightheadedness with hypotension: No Has patient had a PCN reaction causing severe rash involving mucus membranes or skin necrosis:  No Has patient had a PCN reaction that required hospitalization: No Has patient had a PCN reaction occurring within the last 10 years: No If all of the above  answers are "NO", then may proceed with Cephalosporin use.     Past Medical History:  Diagnosis Date  . Bradycardia   . Chronic back pain   . Coronary artery disease    a. s/p STEMI in 2014 with DES to LAD with staged PCI/DES placement to LCx  . Echoencephalogram abnormality   . Heart murmur   . History of Holter monitoring   . Hyperlipidemia   . Hypertension   . Myocardial infarction ([place])   . Neuromuscular disorder (HCC)    neuropathy  . NSVT (nonsustained ventricular tachycardia) ([place])   . Personal history of chemotherapy    ENDOMETRIAL CA  . Personal history of radiation therapy    ENDOMETRIAL CA  . Uterine cancer ([place])    serrous    Past Surgical History:  Procedure Laterality Date  . ABDOMINAL HYSTERECTOMY    . BREAST EXCISIONAL BIOPSY Right 1956   NEG  . CARDIOVERSION N/A 07/27/2017   Procedure: CARDIOVERSION;  Surgeon: Skeet Latch, MD;  Location: [place];  Service: Cardiovascular;  Laterality: N/A;  . LEFT HEART CATHETERIZATION WITH CORONARY ANGIOGRAM  10/19/2012   Procedure: LEFT HEART CATHETERIZATION WITH CORONARY ANGIOGRAM;  Surgeon: Troy Sine, MD;  Location: [facility] CATH LAB;  Service: Cardiovascular;;  . PERCUTANEOUS CORONARY STENT INTERVENTION (PCI-S)  10/19/2012   Procedure: PERCUTANEOUS CORONARY STENT INTERVENTION (PCI-S);  Surgeon: Troy Sine, MD;  Location: [facility] CATH LAB;  Service: Cardiovascular;;  . PERCUTANEOUS CORONARY STENT INTERVENTION (PCI-S) N/A 10/23/2012   Procedure: PERCUTANEOUS CORONARY STENT INTERVENTION (PCI-S);  Surgeon: Troy Sine, MD;  Location: [facility] C[facility] LAB;  Service: Cardiovascular;  Laterality: N/A;  . PERIPHERAL VASCULAR CATHETERIZATION N/A 05/05/2015   Procedure: Glori Luis Cath Insertion;  Surgeon: Algernon Huxley, MD;  Location: [place] CV LAB;  Service: Cardiovascular;  Laterality: N/A;  . PORTA CATH REMOVAL N/A 04/05/2017   Procedure: PORTA CATH REMOVAL;  Surgeon: Algernon Huxley, MD;  Location: [place] CV LAB;  Service:  Cardiovascular;  Laterality: N/A;    Social History   Socioeconomic History  . Marital status: Married    Spouse name: Not on file  . Number of children: Not on file  . Years of education: Not on file  . Highest education level: Not on file  Occupational History  . Not on file  Social Needs  . Financial resource strain: Not on file  . Food insecurity:    Worry: Not on file    Inability: Not on file  . Transportation needs:    Medical: Not on file    Non-medical: Not on file  Tobacco Use  . Smoking status: Former Smoker    Last attempt to quit: 1976    Years since quitting: 43.9  . Smokeless tobacco: Never Used  Substance and Sexual Activity  . Alcohol use: Yes    Alcohol/week: 1.0 standard drinks    Types: 1 Glasses of wine per week  . Drug use: No  . Sexual activity: Yes  Lifestyle  . Physical activity:    Days per week: Not on file    Minutes per session: Not on file  . Stress: Not on file  Relationships  . Social connections:    Talks on phone: Not on file    Gets together: Not on file    Attends religious service:  Not on file    Active member of club or organization: Not on file    Attends meetings of clubs or organizations: Not on file    Relationship status: Not on file  . Intimate partner violence:    Fear of current or ex partner: Not on file    Emotionally abused: Not on file    Physically abused: Not on file    Forced sexual activity: Not on file  Other Topics Concern  . Not on file  Social History Narrative  . Not on file    Family History  Problem Relation Age of Onset  . Heart disease Mother   . Breast cancer Mother 11  . Heart disease Father   . Diabetes Father   . Diabetes Brother   . Stroke Brother   . Kidney cancer Neg Hx   . Bladder Cancer Neg Hx      Current Outpatient Medications:  .  acetaminophen (TYLENOL) 650 MG CR tablet, Take 650 mg by mouth every 8 (eight) hours as needed for pain., Disp: , Rfl:  .  apixaban (ELIQUIS) 5  MG TABS tablet, Take 1 tablet (5 mg total) by mouth 2 (two) times daily., Disp: 180 tablet, Rfl: 1 .  aspirin EC 81 MG tablet, Take 81 mg by mouth daily., Disp: , Rfl:  .  atorvastatin (LIPITOR) 80 MG tablet, TAKE 1 TABLET (80 MG TOTAL) BY MOUTH DAILY AT 6 PM., Disp: 90 tablet, Rfl: 1 .  ezetimibe (ZETIA) 10 MG tablet, Take 1 tablet (10 mg total) by mouth daily., Disp: 30 tablet, Rfl: 0 .  fluticasone (FLONASE) 50 MCG/ACT nasal spray, SPRAY 2 SPRAYS INTO EACH NOSTRIL EVERY DAY, Disp: , Rfl:  .  furosemide (LASIX) 20 MG tablet, Take 20 mg by mouth daily as needed for fluid., Disp: , Rfl:  .  isosorbide mononitrate (IMDUR) 30 MG 24 hr tablet, TAKE 1 TABLET (30 MG TOTAL) BY MOUTH DAILY., Disp: 90 tablet, Rfl: 1 .  losartan (COZAAR) 50 MG tablet, TAKE 1 TABLET BY MOUTH EVERY DAY, Disp: 90 tablet, Rfl: 1 .  Multiple Vitamin (MULTIVITAMIN WITH MINERALS) TABS, Take 1 tablet by mouth daily., Disp: , Rfl:  .  omeprazole (PRILOSEC) 20 MG capsule, Take 20 mg by mouth daily., Disp: , Rfl:  .  oxybutynin (DITROPAN XL) 15 MG 24 hr tablet, Take 1 tablet (15 mg total) by mouth daily., Disp: 30 tablet, Rfl: 11 .  vitamin B-12 (CYANOCOBALAMIN) 1000 MCG tablet, Take 1,000 mcg by mouth daily., Disp: , Rfl:  .  Calcium-Magnesium-Vitamin D (CALCIUM 1200+D3 PO), Take 1 tablet by mouth daily. , Disp: , Rfl:  .  loperamide (IMODIUM A-D) 2 MG tablet, Take 2 mg by mouth 4 (four) times daily as needed for diarrhea or loose stools., Disp: , Rfl:  .  NITROSTAT 0.4 MG SL tablet, PLACE 1 TABLET UNDER THE TOUGUE EVERY 5 MINUTES FOR UP TO 3 DOSES AS NEEDED FOR CHEST PAIN (Patient not taking: Reported on 02/20/2018), Disp: 25 tablet, Rfl: 0 No current facility-administered medications for this visit.   Facility-Administered Medications Ordered in Other Visits:  .  sodium chloride flush (NS) 0.9 % injection 10 mL, 10 mL, Intravenous, PRN, Charlaine Dalton R, MD, 10 mL at 06/06/16 1447 .  sodium chloride flush (NS) 0.9 % injection  10 mL, 10 mL, Intravenous, PRN, Cammie Sickle, MD, 10 mL at 08/15/16 1404  Physical exam:  Vitals:   03/01/18 1010  BP: 126/66  Pulse: 62  Resp:  20  Temp: (!) 96.8 F (36 C)  TempSrc: Tympanic  SpO2: 95%  Weight: 211 lb (95.7 kg)   Physical Exam  Constitutional: She is oriented to person, place, and time. She appears well-developed and well-nourished. No distress.  Unaccompanied. Ambulatory  HENT:  Head: Normocephalic and atraumatic.  Eyes: Pupils are equal, round, and reactive to light. Conjunctivae are normal. No scleral icterus.  Neck: Normal range of motion. Neck supple.  Cardiovascular: An irregularly irregular rhythm present.  Pulmonary/Chest: Effort normal and breath sounds normal. No respiratory distress.  Abdominal: Soft. Bowel sounds are normal. She exhibits no distension. There is no tenderness.  Musculoskeletal: Normal range of motion.  Cane for ambulation  Neurological: She is alert and oriented to person, place, and time. Gait abnormal.  Skin: Skin is warm and dry.  Psychiatric: She has a normal mood and affect. Her behavior is normal.     CMP Latest Ref Rng & Units 09/13/2017  Glucose 65 - 99 mg/dL 114(H)  BUN 6 - 20 mg/dL 19  Creatinine 0.44 - 1.00 mg/dL 0.90  Sodium 135 - 145 mmol/L 141  Potassium 3.5 - 5.1 mmol/L 3.8  Chloride 101 - 111 mmol/L 110  CO2 22 - 32 mmol/L 24  Calcium 8.9 - 10.3 mg/dL 9.6  Total Protein 6.5 - 8.1 g/dL 6.3(L)  Total Bilirubin 0.3 - 1.2 mg/dL 0.8  Alkaline Phos 38 - 126 U/L 90  AST 15 - 41 U/L 33  ALT 14 - 54 U/L 16   CBC Latest Ref Rng & Units 09/13/2017  WBC 3.6 - 11.0 K/uL 5.6  Hemoglobin 12.0 - 16.0 g/dL 12.3  Hematocrit 35.0 - 47.0 % 36.3  Platelets 150 - 440 K/uL 217    No images are attached to the encounter.  No results found.  Assessment and plan- Patient is a 83 y.o. female with history of endometrial cancer who presents to [facility] for neuropathy.   1.  Stage III C1 mixed grade 3  serous/endometrioid uterine cancer with 3 positive pelvic sentinel nodes and negative right aortic SLN s/p 2 cycles of adjuvant carbo-Taxol which were discontinued due to neuropathy, s external pelvic radiation and vaginal brachytherapy in 2017. CA 125 has been followed; most recently 13.8. Imaging on 9/19 normal without evidence of disease.   2.  Chemotherapy-induced peripheral neuropathy- grade 2. Chronic, unrelieved, affecting ADLs. No recent history of electrolyte abnormalities or anemia. Previous TSH normal. No hx of diabetes. On supplemental B12. No history of autoimmune. Likely d/t taxane chemotherapy in 2017. Management and treatment options were discussed in detail.  She opted for topical capsaicin cream and acupuncture referral which was sent today. Will refer to neurology for evaluation and consideration of nerve conduction studies (see below). Discussed follow-up with Dr. Mike Gip or Billey Chang, NP (Palliative Care) for chronic management.   Future consideration may include: Electrical nerve stimulation, occupational and/or physical therapy, Lyrica, or antidepressants such as venlafaxine, duloxetine, or nortriptyline in the future.   - patient states she has a neurologist but chart review reveals neurosurgery.  Will refer to neurology today for evaluation and nerve conduction studies. She requests [place] or [place] locations.   3. Leg Swelling- chronic & unchanged. Bilateral. Managed by PCP per patient. Previously on lasix which she discontinued lasix d/t preference. Discussed options for starting compression stockings, including fit, etc. Follow-up with PCP for continued management.    Visit Diagnosis 1. Carcinoma of endometrium ([place])   2. Chemotherapy-induced peripheral neuropathy (HCC)   3. Leg swelling  Patient expressed understanding and was in agreement with this plan. She also understands that She can call clinic at any time with any questions, concerns, or complaints.    Thank you for allowing me to participate in the care of this very pleasant patient.   Beckey Rutter, DNP, AGNP-C [place] at [facility] [phone] (work cell) [phone] (office)

## 2018-03-04 ENCOUNTER — Telehealth: Payer: Self-pay | Admitting: *Deleted

## 2018-03-04 ENCOUNTER — Encounter: Payer: Self-pay | Admitting: Nurse Practitioner

## 2018-03-04 NOTE — Telephone Encounter (Signed)
Patient called reporting that she was told by Alease Medina, NP to call neurology for a nerve conduction study for her neuropathy and she was told she was told by them that she needs to be referred, so she is asking that we refer her to Neurology [phone]

## 2018-03-04 NOTE — Telephone Encounter (Signed)
  OK to refer.  M

## 2018-03-05 NOTE — Telephone Encounter (Signed)
Referral called to [facility] Neurology; records also faxed.     dhs

## 2018-03-08 ENCOUNTER — Other Ambulatory Visit: Payer: Self-pay | Admitting: Cardiovascular Disease

## 2018-03-15 ENCOUNTER — Inpatient Hospital Stay: Payer: Medicare Other

## 2018-03-15 ENCOUNTER — Encounter: Payer: Self-pay | Admitting: Hematology and Oncology

## 2018-03-15 ENCOUNTER — Other Ambulatory Visit: Payer: Self-pay

## 2018-03-15 ENCOUNTER — Inpatient Hospital Stay: Payer: Medicare Other | Admitting: Hematology and Oncology

## 2018-03-15 VITALS — BP 132/76 | HR 47 | Temp 96.1°F | Resp 18 | Wt 208.6 lb

## 2018-03-15 DIAGNOSIS — I10 Essential (primary) hypertension: Secondary | ICD-10-CM

## 2018-03-15 DIAGNOSIS — Z8542 Personal history of malignant neoplasm of other parts of uterus: Secondary | ICD-10-CM | POA: Diagnosis not present

## 2018-03-15 DIAGNOSIS — Z7901 Long term (current) use of anticoagulants: Secondary | ICD-10-CM

## 2018-03-15 DIAGNOSIS — G620 Drug-induced polyneuropathy: Secondary | ICD-10-CM

## 2018-03-15 DIAGNOSIS — I252 Old myocardial infarction: Secondary | ICD-10-CM

## 2018-03-15 DIAGNOSIS — Z7982 Long term (current) use of aspirin: Secondary | ICD-10-CM

## 2018-03-15 DIAGNOSIS — M7989 Other specified soft tissue disorders: Secondary | ICD-10-CM

## 2018-03-15 DIAGNOSIS — C541 Malignant neoplasm of endometrium: Secondary | ICD-10-CM

## 2018-03-15 DIAGNOSIS — R918 Other nonspecific abnormal finding of lung field: Secondary | ICD-10-CM

## 2018-03-15 DIAGNOSIS — Z79899 Other long term (current) drug therapy: Secondary | ICD-10-CM

## 2018-03-15 DIAGNOSIS — Z87891 Personal history of nicotine dependence: Secondary | ICD-10-CM

## 2018-03-15 LAB — CBC WITH DIFFERENTIAL/PLATELET
Abs Immature Granulocytes: 0.01 10*3/uL (ref 0.00–0.07)
Basophils Absolute: 0 10*3/uL (ref 0.0–0.1)
Basophils Relative: 1 %
Eosinophils Absolute: 0.4 10*3/uL (ref 0.0–0.5)
Eosinophils Relative: 10 %
HCT: 41.4 % (ref 36.0–46.0)
Hemoglobin: 13 g/dL (ref 12.0–15.0)
Immature Granulocytes: 0 %
Lymphocytes Relative: 19 %
Lymphs Abs: 0.8 10*3/uL (ref 0.7–4.0)
MCH: 30.2 pg (ref 26.0–34.0)
MCHC: 31.4 g/dL (ref 30.0–36.0)
MCV: 96.1 fL (ref 80.0–100.0)
Monocytes Absolute: 0.3 10*3/uL (ref 0.1–1.0)
Monocytes Relative: 8 %
Neutro Abs: 2.6 10*3/uL (ref 1.7–7.7)
Neutrophils Relative %: 62 %
Platelets: 193 10*3/uL (ref 150–400)
RBC: 4.31 MIL/uL (ref 3.87–5.11)
RDW: 13.7 % (ref 11.5–15.5)
WBC: 4.2 10*3/uL (ref 4.0–10.5)
nRBC: 0 % (ref 0.0–0.2)

## 2018-03-15 LAB — COMPREHENSIVE METABOLIC PANEL
ALT: 14 U/L (ref 0–44)
AST: 28 U/L (ref 15–41)
Albumin: 4.1 g/dL (ref 3.5–5.0)
Alkaline Phosphatase: 91 U/L (ref 38–126)
Anion gap: 8 (ref 5–15)
BUN: 20 mg/dL (ref 8–23)
CO2: 26 mmol/L (ref 22–32)
Calcium: 9.5 mg/dL (ref 8.9–10.3)
Chloride: 108 mmol/L (ref 98–111)
Creatinine, Ser: 0.93 mg/dL (ref 0.44–1.00)
GFR calc Af Amer: >60 mL/min (ref >60)
GFR calc non Af Amer: 58 mL/min — ABNORMAL LOW (ref >60)
Glucose, Bld: 128 mg/dL — ABNORMAL HIGH (ref 70–99)
Potassium: 3.6 mmol/L (ref 3.5–5.1)
Sodium: 142 mmol/L (ref 135–145)
Total Bilirubin: 0.9 mg/dL (ref 0.3–1.2)
Total Protein: 6.7 g/dL (ref 6.5–8.1)

## 2018-03-15 NOTE — Progress Notes (Signed)
Here for follow up. Per pt overall doing well.  

## 2018-03-15 NOTE — Progress Notes (Signed)
[facility] day:  03/15/2018   Chief Complaint: Susan [name] is a 83 y.o. female with stage IIIC uterine cancer who is seen for 6 month assessment.  HPI:  The patient was last seen in the medical oncology clinic on 09/13/2017.  At that time, she had chronic back pain.  Exam was stable. Labs were unremarkable. Chest CT revealed stable nodules.  Abdomen and pelvic CT on 12/12/2017 revealed no evidence of recurrent or metastatic disease.    Lumbar spine MRI on 12/12/2017 revealed severe stenosis at L4-5 related to 5 mm slip, central disc extrusion with cephalad migrated free fragment. Posterior element hypertrophy is superimposed. BILATERAL L5 neural impingement along with RIGHT L4 neural foraminal narrowing was noted. Similar less severe changes at L1-2, L2-3, L3-4, and L5-S1.  There was no evidence of metastatic disease.  During the interim, patient continues to struggle with neuropathy in her hands. She is scheduled for EMG testing in January. Patient has had radiofrequency ablation to the LEFT side of her spine since her last visit. She is scheduled to have the RIGHT side done soon. Patient is BRADYcardic in clinic today. She plans to go home and "call her electrician" (cardiologist) to re-visit previous discussion regarding have an implanted pacemaker placed.   Patient states, "the radiation fractured my spine and damaged my bladder". Patient is on daily oxybutynin, which "dries her out". Patient is using Biotene lozenges to help with associated xerostomia. Stress incontinence has improved, however patient is still having to use incontinence pads.   Patient is tired today in clinic citing the fact that her dog kept her up all night. Patient denies that she has experienced any B symptoms. She denies any interval infections. Patient denies bleeding; no hematochezia, melena, or gross hematuria.   Patient advises that she maintains an adequate  appetite. She is eating well. Weight today is 208 lb 9.6 oz (94.6 kg), which compared to her last visit to the clinic, represents a 1 pound decrease.    Patient complains of pain rated 6/10 in the clinic today.   Past Medical History:  Diagnosis Date  . Bradycardia   . Chronic back pain   . Coronary artery disease    a. s/p STEMI in 2014 with DES to LAD with staged PCI/DES placement to LCx  . Echoencephalogram abnormality   . Heart murmur   . History of Holter monitoring   . Hyperlipidemia   . Hypertension   . Myocardial infarction ([place])   . Neuromuscular disorder ([facility])    neuropathy  . NSVT (nonsustained ventricular tachycardia) ([place])   . Personal history of chemotherapy    ENDOMETRIAL CA  . Personal history of radiation therapy    ENDOMETRIAL CA  . Uterine cancer ([place])    serrous    Past Surgical History:  Procedure Laterality Date  . ABDOMINAL HYSTERECTOMY    . BREAST EXCISIONAL BIOPSY Right 1956   NEG  . CARDIOVERSION N/A 07/27/2017   Procedure: CARDIOVERSION;  Surgeon: Skeet Latch, MD;  Location: [facility];  Service: Cardiovascular;  Laterality: N/A;  . LEFT HEART CATHETERIZATION WITH CORONARY ANGIOGRAM  10/19/2012   Procedure: LEFT HEART CATHETERIZATION WITH CORONARY ANGIOGRAM;  Surgeon: Troy Sine, MD;  Location: [facility] CATH LAB;  Service: Cardiovascular;;  . PERCUTANEOUS CORONARY STENT INTERVENTION (PCI-S)  10/19/2012   Procedure: PERCUTANEOUS CORONARY STENT INTERVENTION (PCI-S);  Surgeon: Troy Sine, MD;  Location: [facility] CATH LAB;  Service: Cardiovascular;;  . PERCUTANEOUS CORONARY STENT  INTERVENTION (PCI-S) N/A 10/23/2012   Procedure: PERCUTANEOUS CORONARY STENT INTERVENTION (PCI-S);  Surgeon: Troy Sine, MD;  Location: [facility] CATH LAB;  Service: Cardiovascular;  Laterality: N/A;  . PERIPHERAL VASCULAR CATHETERIZATION N/A 05/05/2015   Procedure: Glori Luis Cath Insertion;  Surgeon: Algernon Huxley, MD;  Location: [facility] CV LAB;  Service: Cardiovascular;   Laterality: N/A;  . PORTA CATH REMOVAL N/A 04/05/2017   Procedure: PORTA CATH REMOVAL;  Surgeon: Algernon Huxley, MD;  Location: [facility] CV LAB;  Service: Cardiovascular;  Laterality: N/A;    Family History  Problem Relation Age of Onset  . Heart disease Mother   . Breast cancer Mother 57  . Heart disease Father   . Diabetes Father   . Diabetes Brother   . Stroke Brother   . Kidney cancer Neg Hx   . Bladder Cancer Neg Hx     Social History:  reports that she quit smoking about 43 years ago. She has never used smokeless tobacco. She reports current alcohol use of about 1.0 standard drinks of alcohol per week. She reports that she does not use drugs.  She is originally from [place].  She lives in [place].  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  She and her husband will be married 33 years in 11/2016.  Her husband's name is [name].  She has a trip planned in August (10 day cruise). The patient is alone today.  Allergies:  Allergies  Allergen Reactions  . Ace Inhibitors Cough  . Penicillins Rash and Other (See Comments)    Has patient had a PCN reaction causing immediate rash, facial/tongue/throat swelling, SOB or lightheadedness with hypotension: No Has patient had a PCN reaction causing severe rash involving mucus membranes or skin necrosis: No Has patient had a PCN reaction that required hospitalization: No Has patient had a PCN reaction occurring within the last 10 years: No If all of the above answers are "NO", then may proceed with Cephalosporin use.     Current Medications: Current Outpatient Medications  Medication Sig Dispense Refill  . apixaban (ELIQUIS) 5 MG TABS tablet Take 1 tablet (5 mg total) by mouth 2 (two) times daily. 180 tablet 1  . aspirin EC 81 MG tablet Take 81 mg by mouth daily.    [name] atorvastatin (LIPITOR) 80 MG tablet TAKE 1 TABLET (80 MG TOTAL) BY MOUTH DAILY AT 6 PM. 90 tablet 2  . ezetimibe (ZETIA) 10 MG tablet Take 1  tablet (10 mg total) by mouth daily. 30 tablet 0  . fluticasone (FLONASE) 50 MCG/ACT nasal spray SPRAY 2 SPRAYS INTO EACH NOSTRIL EVERY DAY    . furosemide (LASIX) 20 MG tablet Take 20 mg by mouth daily as needed for fluid.    . isosorbide mononitrate (IMDUR) 30 MG 24 hr tablet TAKE 1 TABLET (30 MG TOTAL) BY MOUTH DAILY. 90 tablet 1  . losartan (COZAAR) 50 MG tablet TAKE 1 TABLET BY MOUTH EVERY DAY 90 tablet 1  . Multiple Vitamin (MULTIVITAMIN WITH MINERALS) TABS Take 1 tablet by mouth daily.    [name] omeprazole (PRILOSEC) 20 MG capsule Take 20 mg by mouth daily.    . vitamin B-12 (CYANOCOBALAMIN) 1000 MCG tablet Take 1,000 mcg by mouth daily.    [name] acetaminophen (TYLENOL) 650 MG CR tablet Take 650 mg by mouth every 8 (eight) hours as needed for pain.    . Calcium-Magnesium-Vitamin D (CALCIUM 1200+D3 PO) Take 1 tablet by mouth daily.     [name]  loperamide (IMODIUM A-D) 2 MG tablet Take 2 mg by mouth 4 (four) times daily as needed for diarrhea or loose stools.    [name] NITROSTAT 0.4 MG SL tablet PLACE 1 TABLET UNDER THE TOUGUE EVERY 5 MINUTES FOR UP TO 3 DOSES AS NEEDED FOR CHEST PAIN (Patient not taking: Reported on 03/15/2018) 25 tablet 0  . oxybutynin (DITROPAN XL) 15 MG 24 hr tablet Take 1 tablet (15 mg total) by mouth daily. (Patient not taking: Reported on 03/15/2018) 30 tablet 11   No current facility-administered medications for this visit.    Facility-Administered Medications Ordered in Other Visits  Medication Dose Route Frequency Provider Last Rate Last Dose  . sodium chloride flush (NS) 0.9 % injection 10 mL  10 mL Intravenous PRN Cammie Sickle, MD   10 mL at 06/06/16 1447  . sodium chloride flush (NS) 0.9 % injection 10 mL  10 mL Intravenous PRN Cammie Sickle, MD   10 mL at 08/15/16 1404    Review of Systems:  GENERAL:  Little tired today.  No fevers, sweats or weight loss. PERFORMANCE STATUS (ECOG):  1-2 HEENT:  No visual changes, runny nose, sore throat, mouth sores or  tenderness.  Dry mouth. Lungs: No shortness of breath or cough.  No hemoptysis. Cardiac: "Atrial fibrillation all of the time".   No chest pain, palpitations, orthopnea, or PND. GI:  Bowels "fine".  No nausea, vomiting, diarrhea, constipation, melena or hematochezia. GU:  Stress incontinence.  No urgency, frequency, dysuria, or hematuria. Musculoskeletal:  s/p RFA left side of spine.  Plan for RFA left side.  No muscle tenderness. Extremities:  No pain or swelling. Skin:  No rashes or skin changes. Neuro:  Neuropathy in feet.  No headache, numbness or weakness, balance or coordination issues. Endocrine:  No diabetes, thyroid issues, hot flashes or night sweats. Psych:  No mood changes, depression or anxiety. Pain:  Right knee pain (6 out of 10). Review of systems:  All other systems reviewed and found to be negative.    Physical Exam:  Blood pressure 132/76, pulse (!) 47, temperature (!) 96.1 F (35.6 C), temperature source Tympanic, resp. rate 18, weight 208 lb 9.6 oz (94.6 kg). GENERAL:  Well developed, well nourished, woman sitting comfortably in the exam room in no acute distress. MENTAL STATUS:  Alert and oriented to person, place and time. HEAD:  Short styled gray hair.  Normocephalic, atraumatic, face symmetric, no Cushingoid features. EYES:  Glasses.  Blue eyes.  Pupils equal round and reactive to light and accomodation.  No conjunctivitis or scleral icterus. ENT:  Oropharynx clear without lesion.  Tongue normal. Mucous membranes dry.  RESPIRATORY:  Clear to auscultation without rales, wheezes or rhonchi. CARDIOVASCULAR:  Regular rate and rhythm without murmur, rub or gallop. ABDOMEN:  Soft, non-tender, with active bowel sounds, and no hepatosplenomegaly.  No masses. SKIN:  No rashes, ulcers or lesions. EXTREMITIES: No edema, no skin discoloration or tenderness.  No palpable cords. LYMPH NODES: No palpable cervical, supraclavicular, axillary or inguinal adenopathy  NEUROLOGICAL:  Unremarkable. PSYCH:  Appropriate.    Appointment on 03/15/2018  Component Date Value Ref Range Status  . Sodium 03/15/2018 142  135 - 145 mmol/L Final  . Potassium 03/15/2018 3.6  3.5 - 5.1 mmol/L Final  . Chloride 03/15/2018 108  98 - 111 mmol/L Final  . CO2 03/15/2018 26  22 - 32 mmol/L Final  . Glucose, Bld 03/15/2018 128* 70 - 99 mg/dL Final  . BUN 03/15/2018 20  8 - 23 mg/dL Final  . Creatinine, Ser 03/15/2018 0.93  0.44 - 1.00 mg/dL Final  . Calcium 03/15/2018 9.5  8.9 - 10.3 mg/dL Final  . Total Protein 03/15/2018 6.7  6.5 - 8.1 g/dL Final  . Albumin 03/15/2018 4.1  3.5 - 5.0 g/dL Final  . AST 03/15/2018 28  15 - 41 U/L Final  . ALT 03/15/2018 14  0 - 44 U/L Final  . Alkaline Phosphatase 03/15/2018 91  38 - 126 U/L Final  . Total Bilirubin 03/15/2018 0.9  0.3 - 1.2 mg/dL Final  . GFR calc non Af Amer 03/15/2018 58* >60 mL/min Final  . GFR calc Af Amer 03/15/2018 >60  >60 mL/min Final  . Anion gap 03/15/2018 8  5 - 15 Final   Performed at [facility], [address] [place], [place] [address]  . WBC 03/15/2018 4.2  4.0 - 10.5 K/uL Final  . RBC 03/15/2018 4.31  3.87 - 5.11 MIL/uL Final  . Hemoglobin 03/15/2018 13.0  12.0 - 15.0 g/dL Final  . HCT 03/15/2018 41.4  36.0 - 46.0 % Final  . MCV 03/15/2018 96.1  80.0 - 100.0 fL Final  . MCH 03/15/2018 30.2  26.0 - 34.0 pg Final  . MCHC 03/15/2018 31.4  30.0 - 36.0 g/dL Final  . RDW 03/15/2018 13.7  11.5 - 15.5 % Final  . Platelets 03/15/2018 193  150 - 400 K/uL Final  . nRBC 03/15/2018 0.0  0.0 - 0.2 % Final  . Neutrophils Relative % 03/15/2018 62  % Final  . Neutro Abs 03/15/2018 2.6  1.7 - 7.7 K/uL Final  . Lymphocytes Relative 03/15/2018 19  % Final  . Lymphs Abs 03/15/2018 0.8  0.7 - 4.0 K/uL Final  . Monocytes Relative 03/15/2018 8  % Final  . Monocytes Absolute 03/15/2018 0.3  0.1 - 1.0 K/uL Final  . Eosinophils Relative 03/15/2018 10  % Final  . Eosinophils Absolute 03/15/2018 0.4  0.0 - 0.5 K/uL Final  .  Basophils Relative 03/15/2018 1  % Final  . Basophils Absolute 03/15/2018 0.0  0.0 - 0.1 K/uL Final  . Immature Granulocytes 03/15/2018 0  % Final  . Abs Immature Granulocytes 03/15/2018 0.01  0.00 - 0.07 K/uL Final   Performed at [facility], [address] [place], [place] [address]    Assessment:  Susan [name] is a 83 y.o. female with stage IIIC endometrial cancer s/p TLH/BSO and staging on 04/08/2015 at [facility].  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  Tumor invaded 2 mm into a 14 mm thick myometrium.  There was no cervical involvement.   There was lymphovascular invasion.  Washings were negative.  There were 3 positive lymph nodes in the pelvis (left external iliac node: 1 cm focus without extranodal extension; less than 0.1 mm focus in 1 external right iliac node and a 0.5 mm focus in another right external iliac node).  Pathologic stage was [id number].  Abdomen and pelvic CT on 03/30/2015 revealed fluid and/or soft tissue filling the endometrial cavity, potentially prolapsing through the cervical os into the upper vagina, compatible with the reported endometrial neoplasm.  There was no definite evidence of metastatic disease in the abdomen or pelvis.  There was multiple nonobstructive calculi in the left renal collecting system.  There was grade 1 spondylolisthesis of L4 upon L5 with extensive multilevel degenerative disc disease and facet arthropathy throughout the lumbar spine, including mild levoscoliosis.  She received 2 cycles of carboplatin and Taxol (05/12/2015 - 06/02/2015).  Chemotherapy was truncated secondary to a debilitating neuropathy.  She received 4500 cGy pelvic radiation and 1200 cGy using high dose rate remote afterloading through vaginal cylinder (completed 10/18/2015).  Abdomen and pelvic CT on 08/31/2016 revealed no evidence of recurrent malignancy in the abdomen or pelvis.  There was a  solid right middle lobe 3 mm pulmonary nodule, for which no prior comparison exists (this portion of the lungs was not included on the prior CT abdomen/pelvis study). There was new patchy sclerosis throughout the sacrum, probably due to post treatment change. There was new irregular curvilinear nondisplaced sacral insufficiency fractures in the bilateral sacral ala.  There was additional findings include aortic atherosclerosis, coronary atherosclerosis, sequela of remote apical left ventricular myocardial infarction, nonobstructing left nephrolithiasis, and small hiatal hernia.  Abdomen and pelvic CT on 12/12/2017 revealed no evidence of recurrent or metastatic disease.  Chest CT on 12/05/2016 revealed scattered right lung nodules measuring up to 6 mm (mean diameter) in the right upper lobe along the minor fissure.  Metastatic disease was not excluded.  Chest CT on 03/06/2017 revealed stable tiny sub-cm right lung nodules, probably benign. Recommendation was for continued followup by chest CT in 6 months.  Chest CT on 09/04/2017 revealed no acute cardiopulmonary abnormalities.  The small pulmonary nodules were stable.  The largest nodule was 6 mm.  CA125 has been followed:  82.5 on 04/28/2015, 22.0 on 06/02/2015, 18.2 on 07/14/2015, 11.7 on 10/21/2015, 16.4 on 01/21/2016, 14.2 on 04/25/2016, 13.6 on 08/15/2016, 16.2 on 12/07/2016, 16.1 on 03/08/2017, 13.8 on 09/13/2017, and 13.2 on 03/15/2018.  She was started on Neurontin for neuropathy.  Higher doses made her "fuzzy headed".  She has urge incontinence.  Symptomatically, she has ongoing issues with her back.  Exam is stable. Labs are unremarkable.   Plan: 1.  Labs today:  CBC with diff, CMP, CA125. 2.  Stage IIIC endometrial cancer   Abdomen and pelvic CT on 12/12/2017 revealed no evidence of recurrent or metastatic disease.   Discuss ongoing surveillance.  Exam every 3-6 months for 2-3 years, then every 6-12 months for 3-5 years and then annually.      Imaging based on clinical indication. 3.  Right lung nodules:   Last chest CT on 09/04/2017.   Consider repeat chest CT on 09/05/2018. 4.  RTC in 6 months for MD assessment (Dr Tasia Catchings) and labs (CBC with diff, CMP, CA125).   Honor Loh, NP  03/15/2018, 11:08 AM   I saw and evaluated the patient, participating in the key portions of the service and reviewing pertinent diagnostic studies and records.  I reviewed the nurse practitioner's note and agree with the findings and the plan.  The assessment and plan were discussed with the patient.  A few questions were asked by the patient and answered.   Nolon Stalls, MD 03/15/2018,11:08 AM

## 2018-03-16 LAB — CA 125: Cancer Antigen (CA) 125: 13.2 U/mL (ref 0.0–38.1)

## 2018-04-17 DIAGNOSIS — G629 Polyneuropathy, unspecified: Secondary | ICD-10-CM | POA: Insufficient documentation

## 2018-04-17 DIAGNOSIS — G620 Drug-induced polyneuropathy: Secondary | ICD-10-CM | POA: Insufficient documentation

## 2018-04-17 DIAGNOSIS — T451X5A Adverse effect of antineoplastic and immunosuppressive drugs, initial encounter: Secondary | ICD-10-CM

## 2018-04-25 ENCOUNTER — Other Ambulatory Visit: Payer: Self-pay | Admitting: Family Medicine

## 2018-04-25 DIAGNOSIS — Z1231 Encounter for screening mammogram for malignant neoplasm of breast: Secondary | ICD-10-CM

## 2018-04-26 ENCOUNTER — Other Ambulatory Visit: Payer: Self-pay | Admitting: Cardiovascular Disease

## 2018-04-26 DIAGNOSIS — I2510 Atherosclerotic heart disease of native coronary artery without angina pectoris: Secondary | ICD-10-CM

## 2018-04-29 ENCOUNTER — Encounter: Payer: Self-pay | Admitting: Urology

## 2018-04-29 ENCOUNTER — Ambulatory Visit: Payer: Medicare Other | Admitting: Urology

## 2018-04-29 VITALS — BP 121/78 | HR 45 | Ht 64.0 in | Wt 204.0 lb

## 2018-04-29 DIAGNOSIS — N3946 Mixed incontinence: Secondary | ICD-10-CM

## 2018-04-29 MED ORDER — OXYBUTYNIN CHLORIDE ER 10 MG PO TB24: 10.0000 mg | ORAL_TABLET | Freq: Every day | ORAL | 3 refills | Status: DC

## 2018-04-29 NOTE — Progress Notes (Signed)
04/29/2018 9:47 AM   Susan [name] January 28, 1935 [id number]  Referring provider: [name], MD [phone] [place], [place] [address]  Chief Complaint  Patient presents with  . mixed incontinence    3 month fu    HPI: I was consulted to assess the patient urinary incontinence which I believe worsened after her radiation last summer. She may have had a little bit of stress incontinence prior. Now she can leak with coughing and sneezing and bending and lifting if her bladder is full. Her primary symptom is urge incontinence triggered by sitting to standing position and especially worse in the morning with foot on the floor syndrome. She does not have bedwetting. She now wears 3-4 pads a day moderately wet but soaked in the morning  She voids every 1-2 hours worse after taking Lasix. She has no nocturia. She had a hysterectomy followed by chemotherapy followed by radiation for endometrial cancer 18 months ago  Well supported bladder neck with a mild positive cough test  The patient has mixed incontinence were primarily urgency incontinence with typical triggers. It was likely worsen post radiation and hysterectomy.   If the patient's stress incontinence was ever treated a urethral injectable would be the treatment of choice  The patient failed Myrbetriq and had diarrhea on Toviaz.  She had sensory urgency on urodynamics with a capacity of 260 mL.  She has severe leakage with bladder overactivity.  Her cough leak point pressure 200 mL was 24 cm of water with moderate leakage.  Cystoscopy was within normal limits.  The patient felt the radiation made things worse.  The patient has mixed incontinence. 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 The risks are a little bit higher for erosion with injectable recognizing limited data. The role of percutaneous tibial nerve stimulation was discussed.  July 2018:  The my index of suspicion is high that the radiation caused her bladder become more overactive. She wants to think about percutaneous tibial nerve stimulation. I did mention Botox which is less ideal arguably in the radiation setting. I will see her when necessary and she will call me and scheduled percutaneous tibial nerve stimulation if she wishes    Today The patient could not remember but she has failed Myrbetriq Toviaz and Vesicare.  She is on oxybutynin 15 mg a day and it has reduced her leaking when she goes from a sitting to standing position and she has no nocturia.  She still uses multiple pads a day for 5 moderately wet or greater.  She can leak without awareness sitting in the chair.  She failed percutaneous tibial nerve stimulation  Treatment options are limited.  She had a normal cystoscopy previously.  I talked her about Botox.  She may be an increased risk of retention or bleeding and may have worsened efficacy recognizing the data.  Modifying factors: There are no other modifying factors  Associated signs and symptoms: There are no other associated signs and symptoms Aggravating and relieving factors: There are no other aggravating or relieving factors Severity: Moderate Duration: Persistent   PMH: Past Medical History:  Diagnosis Date  . Bradycardia   . Chronic back pain   . Coronary artery disease    a. s/p STEMI in 2014 with DES to LAD with staged PCI/DES placement to LCx  . Echoencephalogram abnormality   . Heart murmur   . History of Holter monitoring   .  Hyperlipidemia   . Hypertension   . Myocardial infarction ([place])   . Neuromuscular disorder (HCC)    neuropathy  . NSVT (nonsustained ventricular tachycardia) ([place])   . Personal history of chemotherapy    ENDOMETRIAL CA  . Personal history of radiation therapy    ENDOMETRIAL CA  . Uterine cancer ([place])    serrous    Surgical History: Past Surgical History:  Procedure Laterality Date  . ABDOMINAL  HYSTERECTOMY    . BREAST EXCISIONAL BIOPSY Right 1956   NEG  . CARDIOVERSION N/A 07/27/2017   Procedure: CARDIOVERSION;  Surgeon: Skeet Latch, MD;  Location: [place];  Service: Cardiovascular;  Laterality: N/A;  . LEFT HEART CATHETERIZATION WITH CORONARY ANGIOGRAM  10/19/2012   Procedure: LEFT HEART CATHETERIZATION WITH CORONARY ANGIOGRAM;  Surgeon: Troy Sine, MD;  Location: [facility] CATH LAB;  Service: Cardiovascular;;  . PERCUTANEOUS CORONARY STENT INTERVENTION (PCI-S)  10/19/2012   Procedure: PERCUTANEOUS CORONARY STENT INTERVENTION (PCI-S);  Surgeon: Troy Sine, MD;  [facility] C[facility] LAB;  Service: Cardiovascular;;  . PERCUTANEOUS CORONARY STENT INTERVENTION (PCI-S) N/A 10/23/2012   Procedure: PERCUTANEOUS CORONARY STENT INTERVENTION (PCI-S);  Surgeon: Troy Sine, MD;  Location: [facility] CATH LAB;  Service: Cardiovascular;  Laterality: N/A;  . PERIPHERAL VASCULAR CATHETERIZATION N/A 05/05/2015   Procedure: Glori Luis Cath Insertion;  Surgeon: Algernon Huxley, MD;  Location: [place] CV LAB;  Service: Cardiovascular;  Laterality: N/A;  . PORTA CATH REMOVAL N/A 04/05/2017   Procedure: PORTA CATH REMOVAL;  Surgeon: Algernon Huxley, MD;  Location: [place] CV LAB;  Service: Cardiovascular;  Laterality: N/A;    Home Medications:  Allergies as of 04/29/2018      Reactions   Ace Inhibitors Cough   Penicillins Rash, Other (See Comments)   Has patient had a PCN reaction causing immediate rash, facial/tongue/throat swelling, SOB or lightheadedness with hypotension: No Has patient had a PCN reaction causing severe rash involving mucus membranes or skin necrosis: No Has patient had a PCN reaction that required hospitalization: No Has patient had a PCN reaction occurring within the last 10 years: No If all of the above answers are "NO", then may proceed with Cephalosporin use.      Medication List       Accurate as of April 29, 2018  9:47 AM. Always use your most recent med list.         acetaminophen 650 MG CR tablet Commonly known as:  TYLENOL Take 650 mg by mouth every 8 (eight) hours as needed for pain.   apixaban 5 MG Tabs tablet Commonly known as:  ELIQUIS Take 1 tablet (5 mg total) by mouth 2 (two) times daily.   aspirin EC 81 MG tablet Take 81 mg by mouth daily.   atorvastatin 80 MG tablet Commonly known as:  LIPITOR TAKE 1 TABLET (80 MG TOTAL) BY MOUTH DAILY AT 6 PM.   CALCIUM 1200+D3 PO Take 1 tablet by mouth daily.   DULoxetine 20 MG capsule Commonly known as:  CYMBALTA Take by mouth.   ezetimibe 10 MG tablet Commonly known as:  ZETIA TAKE 1 TABLET BY MOUTH EVERY DAY   fluticasone 50 MCG/ACT nasal spray Commonly known as:  FLONASE SPRAY 2 SPRAYS INTO EACH NOSTRIL EVERY DAY   furosemide 20 MG tablet Commonly known as:  LASIX Take 20 mg by mouth daily as needed for fluid.   isosorbide mononitrate 30 MG 24 hr tablet Commonly known as:  IMDUR TAKE 1 TABLET (30 MG TOTAL) BY MOUTH  DAILY.   loperamide 2 MG tablet Commonly known as:  IMODIUM A-D Take 2 mg by mouth 4 (four) times daily as needed for diarrhea or loose stools.   losartan 50 MG tablet Commonly known as:  COZAAR TAKE 1 TABLET BY MOUTH EVERY DAY   multivitamin with minerals Tabs tablet Take 1 tablet by mouth daily.   NITROSTAT 0.4 MG SL tablet Generic drug:  nitroGLYCERIN PLACE 1 TABLET UNDER THE TOUGUE EVERY 5 MINUTES FOR UP TO 3 DOSES AS NEEDED FOR CHEST PAIN   omeprazole 20 MG capsule Commonly known as:  PRILOSEC Take 20 mg by mouth daily.   oxybutynin 15 MG 24 hr tablet Commonly known as:  DITROPAN XL Take 1 tablet (15 mg total) by mouth daily.   vitamin B-12 1000 MCG tablet Commonly known as:  CYANOCOBALAMIN Take 1,000 mcg by mouth daily.       Allergies:  Allergies  Allergen Reactions  . Ace Inhibitors Cough  . Penicillins Rash and Other (See Comments)    Has patient had a PCN reaction causing immediate rash, facial/tongue/throat swelling, SOB or  lightheadedness with hypotension: No Has patient had a PCN reaction causing severe rash involving mucus membranes or skin necrosis: No Has patient had a PCN reaction that required hospitalization: No Has patient had a PCN reaction occurring within the last 10 years: No If all of the above answers are "NO", then may proceed with Cephalosporin use.     Family History: Family History  Problem Relation Age of Onset  . Heart disease Mother   . Breast cancer Mother 20  . Heart disease Father   . Diabetes Father   . Diabetes Brother   . Stroke Brother   . Kidney cancer Neg Hx   . Bladder Cancer Neg Hx     Social History:  reports that she quit smoking about 44 years ago. She has never used smokeless tobacco. She reports current alcohol use of about 1.0 standard drinks of alcohol per week. She reports that she does not use drugs.  ROS: UROLOGY Frequent Urination?: No Hard to postpone urination?: No Burning/pain with urination?: No Get up at night to urinate?: No Leakage of urine?: Yes Urine stream starts and stops?: No Trouble starting stream?: No Do you have to strain to urinate?: No Blood in urine?: No Urinary tract infection?: No Sexually transmitted disease?: No Injury to kidneys or bladder?: No Painful intercourse?: No Weak stream?: No Currently pregnant?: No Vaginal bleeding?: No Last menstrual period?: n  Gastrointestinal Nausea?: No Vomiting?: No Indigestion/heartburn?: No Diarrhea?: No Constipation?: No  Constitutional Fever: No Night sweats?: No Weight loss?: No Fatigue?: No  Skin Skin rash/lesions?: No Itching?: No  Eyes Blurred vision?: No Double vision?: No  Ears/Nose/Throat Sore throat?: No Sinus problems?: No  Hematologic/Lymphatic Swollen glands?: No Easy bruising?: No  Cardiovascular Leg swelling?: Yes Chest pain?: No  Respiratory Cough?: No Shortness of breath?: No  Endocrine Excessive thirst?: No  Musculoskeletal Back  pain?: Yes Joint pain?: No  Neurological Headaches?: No Dizziness?: No  Psychologic Depression?: No Anxiety?: No  Physical Exam: BP 121/78 (BP Location: Right Arm, Patient Position: Sitting)   Pulse (!) 45   Ht 5\' 4"  (1.626 m)   Wt 204 lb (92.5 kg)   BMI 35.02 kg/m   Constitutional:  Alert and oriented, No acute distress.   Laboratory Data: Lab Results  Component Value Date   WBC 4.2 03/15/2018   HGB 13.0 03/15/2018   HCT 41.4 03/15/2018   MCV  96.1 03/15/2018   PLT 193 03/15/2018    Lab Results  Component Value Date   CREATININE 0.93 03/15/2018    No results found for: PSA  No results found for: TESTOSTERONE  Lab Results  Component Value Date   HGBA1C 5.7 (H) 10/19/2012    Urinalysis    Component Value Date/Time   COLORURINE YELLOW (A) 02/20/2018 0905   APPEARANCEUR CLOUDY (A) 02/20/2018 0905   APPEARANCEUR Clear 08/13/2017 1433   LABSPEC 1.013 02/20/2018 0905   PHURINE 5.0 02/20/2018 0905   GLUCOSEU NEGATIVE 02/20/2018 0905   HGBUR MODERATE (A) 02/20/2018 0905   BILIRUBINUR NEGATIVE 02/20/2018 0905   BILIRUBINUR Negative 08/13/2017 1433   KETONESUR NEGATIVE 02/20/2018 0905   PROTEINUR 30 (A) 02/20/2018 0905   NITRITE NEGATIVE 02/20/2018 0905   LEUKOCYTESUR LARGE (A) 02/20/2018 0905   LEUKOCYTESUR Negative 08/13/2017 1433    Pertinent Imaging:   Assessment & Plan: Patient and I spoke about Botox with my usual template and she just would rather live with the symptoms and try oxybutynin ER 10 mg with 90 tablets and 3 refills and see in a year.  She would like to stop her Eliquis each time.  She thinks the oxybutynin helps and she is hoping that the lower dose will give less side effects.  Were not going to pursue InterStim.  Increased risks and lack of efficacy especially with radiation discussed.  I do not think she should have a urethral injectable either.  There are no diagnoses linked to this encounter.  No follow-ups on file.  Reece Packer, MD  [place] [address] [place], [place] [address] [phone]

## 2018-05-13 ENCOUNTER — Ambulatory Visit
Admission: RE | Admit: 2018-05-13 | Discharge: 2018-05-13 | Disposition: A | Discharge status: Home / Self Care | Payer: Medicare Other | Source: Ambulatory Visit | Attending: Family Medicine | Admitting: Family Medicine

## 2018-05-13 ENCOUNTER — Encounter: Payer: Self-pay | Admitting: *Deleted

## 2018-05-13 DIAGNOSIS — Z1231 Encounter for screening mammogram for malignant neoplasm of breast: Secondary | ICD-10-CM | POA: Diagnosis present

## 2018-05-20 ENCOUNTER — Ambulatory Visit: Payer: Medicare Other | Admitting: Cardiology

## 2018-05-20 ENCOUNTER — Encounter: Payer: Self-pay | Admitting: Cardiology

## 2018-05-20 VITALS — BP 124/76 | HR 60 | Ht 64.0 in | Wt 202.0 lb

## 2018-05-20 DIAGNOSIS — I4819 Other persistent atrial fibrillation: Secondary | ICD-10-CM | POA: Diagnosis not present

## 2018-05-20 NOTE — Progress Notes (Signed)
Electrophysiology Office Note   Date:  05/20/2018   ID:  Susan [name], DOB Dec 11, 1934, MRN [id number]  PCP:  [name], MD  Cardiologist:  Shelva Majestic Primary Electrophysiologist:  Constance Haw, MD    No chief complaint on file.    History of Present Illness: Susan [name] is a 84 y.o. female who presents today for electrophysiology evaluation.   History of coronary artery disease. In July 2014, she presented to [facility] with an anterior wall STEMI and underwent emergent cardiac catheterization where her LAD was found to be totally occluded. She underwent stenting of her LAD eInitially she had significant wall motion abnormality involving the LAD territory. She also was found to have a 95% mid AV groove circumflex stenosis, on 10/23/2012 she underwent successful staged intervention with insertion with a DES stent into the circumflex vessel. She wore a cardiac monitor that showed heart rates as low as 20 but with sinus bradycardia in the 40s-50s and up to 2-3-second pauses.  Her heart rates were in the 60s-80s with activity.  She was sent to the emergency room, but did not get a pacemaker due to lack of symptoms.  Today, denies symptoms of palpitations, chest pain, shortness of breath, orthopnea, PND, lower extremity edema, claudication, dizziness, presyncope, syncope, bleeding, or neurologic sequela. The patient is tolerating medications without difficulties.  She currently feels well.  She has no chest pain or shortness of breath.  She is able to do all of her daily activities with restriction only due to her back problems and neuropathy.   Past Medical History:  Diagnosis Date  . Bradycardia   . Chronic back pain   . Coronary artery disease    a. s/p STEMI in 2014 with DES to LAD with staged PCI/DES placement to LCx  . Echoencephalogram abnormality   . Heart murmur   . History of Holter monitoring   . Hyperlipidemia   . Hypertension   .  Myocardial infarction ([place])   . Neuromuscular disorder (HCC)    neuropathy  . NSVT (nonsustained ventricular tachycardia) ([place])   . Personal history of chemotherapy    ENDOMETRIAL CA  . Personal history of radiation therapy    ENDOMETRIAL CA  . Uterine cancer ([place])    serrous   Past Surgical History:  Procedure Laterality Date  . ABDOMINAL HYSTERECTOMY    . BREAST EXCISIONAL BIOPSY Right 1956   NEG  . CARDIOVERSION N/A 07/27/2017   Procedure: CARDIOVERSION;  Surgeon: Skeet Latch, MD;  Location: [place];  Service: Cardiovascular;  Laterality: N/A;  . LEFT HEART CATHETERIZATION WITH CORONARY ANGIOGRAM  10/19/2012   Procedure: LEFT HEART CATHETERIZATION WITH CORONARY ANGIOGRAM;  Surgeon: Troy Sine, MD;  Location: [facility] CATH LAB;  Service: Cardiovascular;;  . PERCUTANEOUS CORONARY STENT INTERVENTION (PCI-S)  10/19/2012   Procedure: PERCUTANEOUS CORONARY STENT INTERVENTION (PCI-S);  Surgeon: Troy Sine, MD;  [facility] C[facility] LAB;  Service: Cardiovascular;;  . PERCUTANEOUS CORONARY STENT INTERVENTION (PCI-S) N/A 10/23/2012   Procedure: PERCUTANEOUS CORONARY STENT INTERVENTION (PCI-S);  Surgeon: Troy Sine, MD;  Location: [facility] CATH LAB;  Service: Cardiovascular;  Laterality: N/A;  . PERIPHERAL VASCULAR CATHETERIZATION N/A 05/05/2015   Procedure: Glori Luis Cath Insertion;  Surgeon: Algernon Huxley, MD;  Location: [place] CV LAB;  Service: Cardiovascular;  Laterality: N/A;  . PORTA CATH REMOVAL N/A 04/05/2017   Procedure: PORTA CATH REMOVAL;  Surgeon: Algernon Huxley, MD;  Location: [place] CV LAB;  Service: Cardiovascular;  Laterality:  N/A;     Current Outpatient Medications  Medication Sig Dispense Refill  . acetaminophen (TYLENOL) 650 MG CR tablet Take 650 mg by mouth every 8 (eight) hours as needed for pain.    [name] apixaban (ELIQUIS) 5 MG TABS tablet Take 1 tablet (5 mg total) by mouth 2 (two) times daily. 180 tablet 1  . aspirin EC 81 MG tablet Take 81 mg by mouth daily.      [name] atorvastatin (LIPITOR) 80 MG tablet TAKE 1 TABLET (80 MG TOTAL) BY MOUTH DAILY AT 6 PM. 90 tablet 2  . ezetimibe (ZETIA) 10 MG tablet TAKE 1 TABLET BY MOUTH EVERY DAY 90 tablet 0  . fluticasone (FLONASE) 50 MCG/ACT nasal spray SPRAY 2 SPRAYS INTO EACH NOSTRIL EVERY DAY    . isosorbide mononitrate (IMDUR) 30 MG 24 hr tablet TAKE 1 TABLET (30 MG TOTAL) BY MOUTH DAILY. 90 tablet 1  . loperamide (IMODIUM A-D) 2 MG tablet Take 2 mg by mouth 4 (four) times daily as needed for diarrhea or loose stools.    [name] losartan (COZAAR) 50 MG tablet TAKE 1 TABLET BY MOUTH EVERY DAY 90 tablet 1  . Multiple Vitamin (MULTIVITAMIN WITH MINERALS) TABS Take 1 tablet by mouth daily.    [name] NITROSTAT 0.4 MG SL tablet PLACE 1 TABLET UNDER THE TOUGUE EVERY 5 MINUTES FOR UP TO 3 DOSES AS NEEDED FOR CHEST PAIN 25 tablet 0  . omeprazole (PRILOSEC) 20 MG capsule Take 20 mg by mouth daily.    [name] oxybutynin (DITROPAN-XL) 10 MG 24 hr tablet Take 1 tablet (10 mg total) by mouth daily. 90 tablet 3  . vitamin B-12 (CYANOCOBALAMIN) 1000 MCG tablet Take 1,000 mcg by mouth daily.     No current facility-administered medications for this visit.    Facility-Administered Medications Ordered in Other Visits  Medication Dose Route Frequency Provider Last Rate Last Dose  . sodium chloride flush (NS) 0.9 % injection 10 mL  10 mL Intravenous PRN Cammie Sickle, MD   10 mL at 06/06/16 1447  . sodium chloride flush (NS) 0.9 % injection 10 mL  10 mL Intravenous PRN Cammie Sickle, MD   10 mL at 08/15/16 1404    Allergies:   Ace inhibitors and Penicillins   Social History:  The patient  reports that she quit smoking about 44 years ago. She has never used smokeless tobacco. She reports current alcohol use of about 1.0 standard drinks of alcohol per week. She reports that she does not use drugs.   Family History:  The patient's family history includes Breast cancer (age of onset: 38) in her mother; Diabetes in her brother and  father; Heart disease in her father and mother; Stroke in her brother.   ROS:  Please see the history of present illness.   Otherwise, review of systems is positive for leg swelling, leg pain, balance problems, back pain.   All other systems are reviewed and negative.   PHYSICAL EXAM: VS:  BP 124/76   Pulse 60   Ht 5\' 4"  (1.626 m)   Wt 202 lb (91.6 kg)   BMI 34.67 kg/m  , BMI Body mass index is 34.67 kg/m. GEN: Well nourished, well developed, in no acute distress  HEENT: normal  Neck: no JVD, carotid bruits, or masses Cardiac: iRRR; no murmurs, rubs, or gallops,no edema  Respiratory:  clear to auscultation bilaterally, normal work of breathing GI: soft, nontender, nondistended, + BS MS: no deformity or atrophy  Skin: warm and  dry Neuro:  Strength and sensation are intact Psych: euthymic mood, full affect  EKG:  EKG is ordered today. Personal review of the ekg ordered shows atrial fibrillation, IVCD, diffuse T wave abnormalities  Recent Labs: 03/15/2018: ALT 14; BUN 20; Creatinine, Ser 0.93; Hemoglobin 13.0; Platelets 193; Potassium 3.6; Sodium 142    Lipid Panel     Component Value Date/Time   CHOL 116 06/06/2017 0922   CHOL 141 04/16/2013 0924   TRIG 75 06/06/2017 0922   TRIG 112 04/16/2013 0924   HDL 51 06/06/2017 0922   HDL 53 04/16/2013 0924   CHOLHDL 2.3 06/06/2017 0922   CHOLHDL 2.2 06/19/2016 0950   VLDL 20 06/19/2016 0950   LDLCALC 50 06/06/2017 0922   LDLCALC 66 04/16/2013 0924     Wt Readings from Last 3 Encounters:  05/20/18 202 lb (91.6 kg)  04/29/18 204 lb (92.5 kg)  03/15/18 208 lb 9.6 oz (94.6 kg)      Other studies Reviewed: Additional studies/ records that were reviewed today include: TTE 01/04/17 Review of the above records today demonstrates:  - Left ventricle: The cavity size was normal. There was moderate   concentric hypertrophy. Systolic function was mildly to   moderately reduced. The estimated ejection fraction was in the   range of  40% to 45%. Severe hypokinesis of the inferior,   inferoseptal, and apical myocardium. Mild anteroseptal and   inferolateral hypokinesis. Doppler parameters are consistent with   abnormal left ventricular relaxation (grade 1 diastolic   dysfunction). Doppler parameters are consistent with high   ventricular filling pressure. - Aortic valve: There was mild stenosis. Peak velocity (S): 238   cm/s. Mean gradient (S): 12 mm Hg. Valve area (VTI): 2.7 cm^2.   Valve area (Vmax): 2.18 cm^2. Valve area (Vmean): 2.39 cm^2. - Mitral valve: Calcified annulus. Severe thickening and   calcification. Transvalvular velocity was within the normal   range. There was no evidence for stenosis. There was mild   regurgitation. Valve area by continuity equation (using LVOT   flow): 3.08 cm^2. - Left atrium: The atrium was severely dilated. - Right ventricle: The cavity size was normal. Wall thickness was   normal. Systolic function was normal. - Tricuspid valve: There was no regurgitation. - Pulmonary arteries: Systolic pressure was within the normal   range. PA peak pressure: 32 mm Hg (S). - Pericardium, extracardiac: A trivial pericardial effusion was   identified.  Holter 01/17/17 - personally reviewed Markedly abnormal monitor demonstrating predominantly underlying atrial fibrillation with slow ventricular rate.  The average heart beat was 53 bpm.  The maximum heart rate was 109 bpm.  During the monitoring period, the patient had significant bradycardia with heart rates down in the mid to low 30s.  Th there were 2 episodes with heart rates below 30 at 25 bpm  and at 19 bpm immediately thereafter at 9:57 PM on 01/10/2017.  On 01/11/2017 at 7:47 AM heart rate was 24 bpm.   ASSESSMENT AND PLAN:  1.  Sick sinus syndrome: Heart rates are in the 50s in atrial fibrillation.  Minimally symptomatic.  No plans to return to sinus rhythm.  2. Coronary artery disease: Asymptomatic without chest pain.  3.   Persistent atrial fibrillation: Has gotten to a rate control strategy.  She is feeling well.  We Abrham Maslowski continue Eliquis.  This patients CHA2DS2-VASc Score and unadjusted Ischemic Stroke Rate (% per year) is equal to 4.8 % stroke rate/year from a score of 4  Above score calculated  as 1 point each if present [CHF, HTN, DM, Vascular=MI/PAD/Aortic Plaque, Age if 65-74, or Female] Above score calculated as 2 points each if present [Age > 75, or Stroke/TIA/TE]  Current medicines are reviewed at length with the patient today.   The patient does not have concerns regarding her medicines.  The following changes were made today: None  Labs/ tests ordered today include:  Orders Placed This Encounter  Procedures  . EKG 12-Lead     Disposition:   FU with Carolee Channell 6 months  Signed, Sima Lindenberger Meredith Leeds, MD  05/20/2018 2:14 PM     [place] [address] [phone] (office) [phone] (fax)

## 2018-05-20 NOTE — Patient Instructions (Addendum)
Medication Instructions:  Your physician recommends that you continue on your current medications as directed. Please refer to the Current Medication list given to you today.  If you need a refill on your cardiac medications before your next appointment, please call your pharmacy.   Labwork: None ordered  Testing/Procedures: None ordered  Follow-Up: Your physician wants you to follow-up in: 6 months with Dr. Camnitz.  You will receive a reminder letter in the mail two months in advance. If you don't receive a letter, please call our office to schedule the follow-up appointment.  Thank you for choosing [facility]!!   Macio Kissoon, RN [phone]         

## 2018-06-28 ENCOUNTER — Other Ambulatory Visit: Payer: Self-pay | Admitting: Cardiovascular Disease

## 2018-06-28 NOTE — Telephone Encounter (Signed)
Isosorbide refilled. 

## 2018-07-19 ENCOUNTER — Other Ambulatory Visit: Payer: Self-pay | Admitting: Cardiovascular Disease

## 2018-07-19 ENCOUNTER — Telehealth: Payer: Self-pay | Admitting: *Deleted

## 2018-07-19 NOTE — Telephone Encounter (Signed)
error 

## 2018-07-19 NOTE — Telephone Encounter (Signed)
Pt is a 84 yr old female who saw [name] on 05/20/17, weight at that visit was 91.6Kg. SCr on 03/15/18 was 0.93. Will refill Eliquis 5mg  BID per criteria

## 2018-08-01 ENCOUNTER — Other Ambulatory Visit: Payer: Self-pay | Admitting: Cardiovascular Disease

## 2018-08-01 DIAGNOSIS — I2510 Atherosclerotic heart disease of native coronary artery without angina pectoris: Secondary | ICD-10-CM

## 2018-08-20 ENCOUNTER — Other Ambulatory Visit: Payer: Self-pay

## 2018-08-21 ENCOUNTER — Inpatient Hospital Stay: Payer: Medicare Other | Attending: Obstetrics and Gynecology | Admitting: Obstetrics and Gynecology

## 2018-08-21 ENCOUNTER — Other Ambulatory Visit: Payer: Self-pay

## 2018-08-21 VITALS — BP 128/81 | HR 67 | Temp 98.0°F | Ht 64.0 in | Wt 206.1 lb

## 2018-08-21 DIAGNOSIS — R32 Unspecified urinary incontinence: Secondary | ICD-10-CM | POA: Insufficient documentation

## 2018-08-21 DIAGNOSIS — Z8542 Personal history of malignant neoplasm of other parts of uterus: Secondary | ICD-10-CM

## 2018-08-21 DIAGNOSIS — Z9221 Personal history of antineoplastic chemotherapy: Secondary | ICD-10-CM

## 2018-08-21 DIAGNOSIS — Z87891 Personal history of nicotine dependence: Secondary | ICD-10-CM | POA: Insufficient documentation

## 2018-08-21 DIAGNOSIS — Z90722 Acquired absence of ovaries, bilateral: Secondary | ICD-10-CM | POA: Diagnosis not present

## 2018-08-21 DIAGNOSIS — Z923 Personal history of irradiation: Secondary | ICD-10-CM

## 2018-08-21 DIAGNOSIS — Z90710 Acquired absence of both cervix and uterus: Secondary | ICD-10-CM | POA: Diagnosis not present

## 2018-08-21 DIAGNOSIS — C541 Malignant neoplasm of endometrium: Secondary | ICD-10-CM

## 2018-08-21 DIAGNOSIS — Z08 Encounter for follow-up examination after completed treatment for malignant neoplasm: Secondary | ICD-10-CM | POA: Diagnosis present

## 2018-08-21 DIAGNOSIS — G8929 Other chronic pain: Secondary | ICD-10-CM | POA: Diagnosis not present

## 2018-08-21 DIAGNOSIS — G620 Drug-induced polyneuropathy: Secondary | ICD-10-CM | POA: Insufficient documentation

## 2018-08-21 DIAGNOSIS — M549 Dorsalgia, unspecified: Secondary | ICD-10-CM | POA: Diagnosis not present

## 2018-08-21 DIAGNOSIS — Z6835 Body mass index (BMI) 35.0-35.9, adult: Secondary | ICD-10-CM | POA: Diagnosis not present

## 2018-08-21 NOTE — Progress Notes (Signed)
Gynecologic Oncology Interval Visit   Referring Provider: Dr Joylene Igo  Chief Concern: stage IIIC1serous endometrial cancer  Subjective:  Susan [name] is a 84 y.o., P3 female, initially seen in consultation from Dr. 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   Today, she reports continued back pain, right knee pain and swelling, and numbness and tingling. These are chronic problems from radiation and chemo. Seeing a pain specialist and had RFA procedure for back pain.  Continues to teach water aerobics courses.  Also has some urinary leakage and uses oxybutinin.      CT C/A/P 12/22/17 IMPRESSION: 1. No acute cardio pulmonary abnormalities. 2. Stable appearance of small pulmonary nodules compared with 12/05/2016. The largest nodule measures 6 mm. Future CT at 18-24 months (from 12/05/2016) is considered optional for low-risk patients, but is recommended for high-risk patients. This recommendation follows the consensus statement: Guidelines for Management of Incidental Pulmonary Nodules Detected on CT Images: From the Fleischner Society 2017; Radiology 2017; 284:228-243. 3. Aortic atherosclerosis and 3 vessel coronary artery atherosclerotic calcifications.  Gynecologic Oncology  Susan [name] is a 84 y.o. 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  Please see complete details in prior notes.   Path report below shows stage IIIC1 disease.  Washings were negative, but she had 3 positive SLNs in pelvis.  An aortic SLN was negative.     A. Lymph node, sentinel, right external iliac, excision: Minute focus of metastatic carcinoma involving one lymph node (1/1). See note. Size of metastasis: Less than  0.1 mm. Negative for extranodal invasion.  Note: A cytokeratin AE1/3 highlights the metastatic focus.  B. Lymph node, sentinel, right external iliac #2, excision: Metastatic carcinoma involving one lymph node (1/1). See note. Size of metastasis: 0.5 mm. Negative for extranodal invasion. Note: A cytokeratin AE1/3 highlights the metastatic focus.  C. Lymph node, sentinel, right aortic, excision: One benign lymph node (0/1). Note: A cytokeratin AE1/3 immunostain is negative.  D. Lymph node, sentinel, left external iliac, excision:  Metastatic adenocarcinoma involving one lymph node (1/1).   Size of metastasis: 1 cm. Negative for extranodal invasion. Note: A cytokeratin AE1/3 highlights the metastatic focus.  E. 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  Tumor size: 5.1 cm. The tumor invades 2 mm into a 14 mm thick myometrium. Negative for cervical involvement. Lymphovascular invasion is present.  04/28/2015 CA125 82.5   Recommendation to proceed with with carboplatinum and paclitaxel three-four cycles followed by radiation therapy followed by additional 2-3 cycles of chemotherapy. She started on chemotherapy on 05/12/2015 and received 2nd cycle on 06/02/2015. She developed significant neuropathy impairing her quality of life with complaints of sensory ataxia, frequent falls, and needs to walk with the help of walker. Chemotherapy was discontinued. Then received 4500 cGy pelvic radiation and 1200 cGy using high dose rate remote afterloading through vaginal cylinder completed 7/17.  08/31/2016 Abdominal and pelvic CT scan revealed no evidence of recurrent disease.  There was a new sacral insufficiency fracture.  She was referred to surgery.  Scans revealed a 3 mm pulmonary nodule.  Repeat chest CT in 3 months was recommended.  Pelvic MRI on  10/23/2016 revealed bilateral sacral insufficiency fractures  with possible insufficiency fractures of the left greater than right bilateral iliac wings.  Lumbar spine MRI revealed diffuse degenerative disease.  Decision was made not to perform a sacral plasty.  She was seen by Dr. Deetta Perla on 11/07/2016.  Surgical decompression or fusion was not recommended. Recommendation was made to the pain clinic for treatment (medication, injections, or spinal cord stimulator). She is currently seeing a chiropractor and has had 6 treatments.  Chest CT on 12/05/2016 revealed scattered right lung nodules measuring up to 6 mm (mean diameter) in the right upper lobe along the minor fissure.  Metastatic disease was not excluded.  Recommendation was for follow-up CT in 3-6 months.   CA125 values 04/28/2015 82.5  06/02/2015 22.0 07/14/2015 18.2 10/21/2015 11.7 01/21/2016 16.4 04/25/2016 14.2 08/15/2016 13.6 12/07/2016  16.2 03/08/2017 16.1  Seen in clinic on 02/21/2017 by Dr. Theora Gianotti with negative exam.   She has seen Dr. Mike Gip for routine follow-up and had a negative exam. She has had her port removed. She continues to be followed by cardiology for bradycardia. She declined a pacemaker at that time for asymptomatic bradycardia. She developed atrial fibrillation and underwent electrical cardioversion on 07/27/17. She has known lung nodules which showed:   03/06/2017 CT Chest WO Contrast: IMPRESSION: Stable tiny sub-cm right lung nodules, probably benign. Recommend continued followup by chest CT in 6 months.  She had a prior history of urinary incontinence and was seen by Dr. Matilde Sprang, Urology on 08/13/17. They discussed percutaneous tibial nerve stimulation.    Problem List: Patient Active Problem List   Diagnosis Date Noted  . Chemotherapy-induced neuropathy ([place]) 04/17/2018  . Neuropathy 04/17/2018  . Chemotherapy-induced peripheral neuropathy ([place]) 03/01/2018  . Persistent atrial fibrillation   .  Port-A-Cath in place 03/28/2017  . Bradycardia 01/18/2017  . Obesity (BMI 30-39.9) 01/17/2017  . Pulmonary nodules 12/05/2016  . Compression fracture of L5 vertebra with delayed healing 11/01/2016  . Sacral insufficiency fracture with delayed healing 11/01/2016  . Sacral insufficiency fracture 09/05/2016  . Absence of bladder continence 07/21/2015  . Neuropathy associated with cancer ([place]) 06/02/2015  . Carcinoma of endometrium ([place]) 04/02/2015  . Gastro-esophageal reflux disease without esophagitis 02/15/2015  . Osteopenia 08/17/2014  . Hyperlipidemia 02/13/2014  . Kidney stones 10/27/2013  . CA skin, basal cell 06/30/2013  . Lumbar canal stenosis 06/30/2013  . Arthritis, degenerative 06/30/2013  . Neuralgia neuritis, sciatic nerve 06/30/2013  . Coronary artery disease 10/25/2012  . Dyslipidemia 10/25/2012  . NSVT (nonsustained ventricular tachycardia) ([place]) 10/25/2012  . Sick sinus syndrome ([place]) 10/25/2012  . Ischemic cardiomyopathy 10/22/2012  . Old anterior wall myocardial infarction 10/20/2012  . Hypertensive heart disease without CHF 10/20/2012    Past Medical History: Past Medical History:  Diagnosis Date  . Bradycardia   . Chronic back pain   . Coronary artery disease    a. s/p STEMI in 2014 with DES to LAD with staged PCI/DES placement to LCx  . Echoencephalogram abnormality   . Heart murmur   . History of Holter monitoring   . Hyperlipidemia   . Hypertension   . Myocardial infarction ([place])   . Neuromuscular disorder ([facility])    neuropathy  . NSVT (nonsustained ventricular tachycardia) ([place])   . Personal history of chemotherapy    ENDOMETRIAL CA  . Personal history of radiation therapy    ENDOMETRIAL CA  . Uterine cancer ([place])    serrous    Past Surgical History: Past Surgical History:  Procedure Laterality Date  . ABDOMINAL HYSTERECTOMY    .  BREAST EXCISIONAL BIOPSY Right 1956   NEG  . CARDIOVERSION N/A 07/27/2017   Procedure: CARDIOVERSION;  Surgeon:  Skeet Latch, MD;  Location: [facility];  Service: Cardiovascular;  Laterality: N/A;  . LEFT HEART CATHETERIZATION WITH CORONARY ANGIOGRAM  10/19/2012   Procedure: LEFT HEART CATHETERIZATION WITH CORONARY ANGIOGRAM;  Surgeon: Troy Sine, MD;  Location: [facility] CATH LAB;  Service: Cardiovascular;;  . PERCUTANEOUS CORONARY STENT INTERVENTION (PCI-S)  10/19/2012   Procedure: PERCUTANEOUS CORONARY STENT INTERVENTION (PCI-S);  Surgeon: Troy Sine, MD;  Location: [facility] CATH LAB;  Service: Cardiovascular;;  . PERCUTANEOUS CORONARY STENT INTERVENTION (PCI-S) N/A 10/23/2012   Procedure: PERCUTANEOUS CORONARY STENT INTERVENTION (PCI-S);  Surgeon: Troy Sine, MD;  Location: [facility] CATH LAB;  Service: Cardiovascular;  Laterality: N/A;  . PERIPHERAL VASCULAR CATHETERIZATION N/A 05/05/2015   Procedure: Glori Luis Cath Insertion;  Surgeon: Algernon Huxley, MD;  Location: [place] CV LAB;  Service: Cardiovascular;  Laterality: N/A;  . PORTA CATH REMOVAL N/A 04/05/2017   Procedure: PORTA CATH REMOVAL;  Surgeon: Algernon Huxley, MD;  Location: [place] CV LAB;  Service: Cardiovascular;  Laterality: N/A;    Family History: Family History  Problem Relation Age of Onset  . Heart disease Mother   . Breast cancer Mother 16  . Heart disease Father   . Diabetes Father   . Diabetes Brother   . Stroke Brother   . Kidney cancer Neg Hx   . Bladder Cancer Neg Hx     Social History: Social History   Socioeconomic History  . Marital status: Married    Spouse name: Not on file  . Number of children: Not on file  . Years of education: Not on file  . Highest education level: Not on file  Occupational History  . Not on file  Social Needs  . Financial resource strain: Not on file  . Food insecurity:    Worry: Not on file    Inability: Not on file  . Transportation needs:    Medical: Not on file    Non-medical: Not on file  Tobacco Use  . Smoking status: Former Smoker    Last attempt to quit: 1976    Years  since quitting: 44.4  . Smokeless tobacco: Never Used  Substance and Sexual Activity  . Alcohol use: Yes    Alcohol/week: 1.0 standard drinks    Types: 1 Glasses of wine per week  . Drug use: No  . Sexual activity: Yes  Lifestyle  . Physical activity:    Days per week: Not on file    Minutes per session: Not on file  . Stress: Not on file  Relationships  . Social connections:    Talks on phone: Not on file    Gets together: Not on file    Attends religious service: Not on file    Active member of club or organization: Not on file    Attends meetings of clubs or organizations: Not on file    Relationship status: Not on file  . Intimate partner violence:    Fear of current or ex partner: Not on file    Emotionally abused: Not on file    Physically abused: Not on file    Forced sexual activity: Not on file  Other Topics Concern  . Not on file  Social History Narrative  . Not on file    Allergies: Allergies  Allergen Reactions  . Ace Inhibitors Cough  . Penicillins Rash and Other (See Comments)  Has patient had a PCN reaction causing immediate rash, facial/tongue/throat swelling, SOB or lightheadedness with hypotension: No Has patient had a PCN reaction causing severe rash involving mucus membranes or skin necrosis: No Has patient had a PCN reaction that required hospitalization: No Has patient had a PCN reaction occurring within the last 10 years: No If all of the above answers are "NO", then may proceed with Cephalosporin use.     Current Medications: Current Outpatient Medications  Medication Sig Dispense Refill  . acetaminophen (TYLENOL) 650 MG CR tablet Take 650 mg by mouth every 8 (eight) hours as needed for pain.    [name] aspirin EC 81 MG tablet Take 81 mg by mouth daily.    [name] atorvastatin (LIPITOR) 80 MG tablet TAKE 1 TABLET (80 MG TOTAL) BY MOUTH DAILY AT 6 PM. 90 tablet 2  . ELIQUIS 5 MG TABS tablet TAKE 1 TABLET BY MOUTH TWICE A DAY 180 tablet 1  . ezetimibe  (ZETIA) 10 MG tablet TAKE 1 TABLET BY MOUTH EVERY DAY 90 tablet 0  . fluticasone (FLONASE) 50 MCG/ACT nasal spray SPRAY 2 SPRAYS INTO EACH NOSTRIL EVERY DAY    . isosorbide mononitrate (IMDUR) 30 MG 24 hr tablet TAKE 1 TABLET (30 MG TOTAL) BY MOUTH DAILY. 90 tablet 1  . loperamide (IMODIUM A-D) 2 MG tablet Take 2 mg by mouth 4 (four) times daily as needed for diarrhea or loose stools.    [name] losartan (COZAAR) 25 MG tablet TAKE 2 TABLETS BY MOUTH EVERY DAY 180 tablet 1  . losartan (COZAAR) 50 MG tablet TAKE 1 TABLET BY MOUTH EVERY DAY 90 tablet 1  . Multiple Vitamin (MULTIVITAMIN WITH MINERALS) TABS Take 1 tablet by mouth daily.    [name] NITROSTAT 0.4 MG SL tablet PLACE 1 TABLET UNDER THE TOUGUE EVERY 5 MINUTES FOR UP TO 3 DOSES AS NEEDED FOR CHEST PAIN 25 tablet 0  . omeprazole (PRILOSEC) 20 MG capsule Take 20 mg by mouth daily.    [name] oxybutynin (DITROPAN-XL) 10 MG 24 hr tablet Take 1 tablet (10 mg total) by mouth daily. 90 tablet 3  . vitamin B-12 (CYANOCOBALAMIN) 1000 MCG tablet Take 1,000 mcg by mouth daily.     No current facility-administered medications for this visit.    Facility-Administered Medications Ordered in Other Visits  Medication Dose Route Frequency Provider Last Rate Last Dose  . sodium chloride flush (NS) 0.9 % injection 10 mL  10 mL Intravenous PRN Cammie Sickle, MD   10 mL at 06/06/16 1447  . sodium chloride flush (NS) 0.9 % injection 10 mL  10 mL Intravenous PRN Cammie Sickle, MD   10 mL at 08/15/16 1404   Review of Systems General:  fatigue Skin: no complaints Eyes: no complaints HEENT: no complaints Breasts: no complaints Pulmonary: DOE. No productive cough Cardiac: no complaints Gastrointestinal: no complaints Genitourinary/Sexual: no complaints Ob/Gyn: no complaints Musculoskeletal: back pain and leg swelling unchanged and chronic Hematology: no complaints Neurologic/Psych: peripheral neuropathy   Objective:  Physical Examination:  BP 128/81  (BP Location: Right Arm, Patient Position: Sitting)   Pulse 67   Temp 98 F (36.7 C) (Tympanic)   Ht 5\' 4"  (1.626 m)   Wt 206 lb 1.6 oz (93.5 kg)   BMI 35.38 kg/m    ECOG Performance Status: 1 - Symptomatic but completely ambulatory  GENERAL: Patient is a well appearing female in no acute distress HEENT:  Sclerae anicteric.  Oropharynx clear and moist. No ulcerations or evidence of oropharyngeal  candidiasis. Neck is supple.  NODES:  No cervical, supraclavicular, or axillary lymphadenopathy palpated.  LUNGS:  Clear to auscultation bilaterally.  No wheezes or rhonchi. HEART:  Regular rate and rhythm. No murmur appreciated. ABDOMEN:  Soft, nontender.  Positive, normoactive bowel sounds. No organomegaly palpated. MSK:  No focal spinal tenderness to palpation. Full range of motion bilaterally in the upper extremities. Ambulates with Cane EXTREMITIES:  No peripheral edema.   SKIN:  Clear with no obvious rashes or skin changes. No nail dyscrasia. NEURO:  Nonfocal. Well oriented.  Appropriate affect. BREAST: Breasts appear normal, no suspicious masses, no skin or nipple changes or axillary nodes  Pelvic: Exam Chaperoned by RN. Vulva: normal appearing vulva with no masses, tenderness or lesions; Vagina: normal vagina; no lesions, vaginal shortening; BME: no masses; Uterus/Cervix:surgically absent; Rectal: confirmatory  Assessment:  Susan [name] is a 84 y.o. 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 NED today on exam.  CT scan 9/19 normal and no change in indeterminate lung lesions.    Body mass index is 35.38 kg/m.  Urinary incontinence that is better with oxybutin.  Peripheral neuropathy due to  taxane chemotherapy.  Follow up with neurology for continued management.   Back pain: will continue with her current treating providers.   Plan:  CA125 pending.  She will continue follow up with Dr. Baruch Gouty at the [facility] cancer center.    She will continue to follow up with Urology, Cardiology, and her other care providers regarding her medical co morbidities.    RTC in 6 months to see [place].  Mellody Drown, MD

## 2018-09-13 ENCOUNTER — Other Ambulatory Visit: Payer: Self-pay

## 2018-09-16 ENCOUNTER — Inpatient Hospital Stay: Payer: Medicare Other | Attending: Oncology

## 2018-09-16 ENCOUNTER — Other Ambulatory Visit: Payer: Self-pay

## 2018-09-16 ENCOUNTER — Inpatient Hospital Stay: Payer: Medicare Other | Admitting: Oncology

## 2018-09-16 ENCOUNTER — Encounter: Payer: Self-pay | Admitting: Oncology

## 2018-09-16 VITALS — BP 114/73 | HR 53 | Temp 96.0°F | Resp 18 | Wt 206.6 lb

## 2018-09-16 DIAGNOSIS — G620 Drug-induced polyneuropathy: Secondary | ICD-10-CM

## 2018-09-16 DIAGNOSIS — R918 Other nonspecific abnormal finding of lung field: Secondary | ICD-10-CM

## 2018-09-16 DIAGNOSIS — Z8542 Personal history of malignant neoplasm of other parts of uterus: Secondary | ICD-10-CM | POA: Insufficient documentation

## 2018-09-16 DIAGNOSIS — Z9221 Personal history of antineoplastic chemotherapy: Secondary | ICD-10-CM | POA: Insufficient documentation

## 2018-09-16 DIAGNOSIS — I495 Sick sinus syndrome: Secondary | ICD-10-CM | POA: Insufficient documentation

## 2018-09-16 DIAGNOSIS — Z90710 Acquired absence of both cervix and uterus: Secondary | ICD-10-CM | POA: Insufficient documentation

## 2018-09-16 DIAGNOSIS — Z87891 Personal history of nicotine dependence: Secondary | ICD-10-CM | POA: Insufficient documentation

## 2018-09-16 DIAGNOSIS — R001 Bradycardia, unspecified: Secondary | ICD-10-CM | POA: Insufficient documentation

## 2018-09-16 DIAGNOSIS — C55 Malignant neoplasm of uterus, part unspecified: Secondary | ICD-10-CM

## 2018-09-16 DIAGNOSIS — R7989 Other specified abnormal findings of blood chemistry: Secondary | ICD-10-CM | POA: Diagnosis not present

## 2018-09-16 DIAGNOSIS — C541 Malignant neoplasm of endometrium: Secondary | ICD-10-CM

## 2018-09-16 DIAGNOSIS — N179 Acute kidney failure, unspecified: Secondary | ICD-10-CM | POA: Insufficient documentation

## 2018-09-16 LAB — CBC WITH DIFFERENTIAL/PLATELET
Abs Immature Granulocytes: 0.01 10*3/uL (ref 0.00–0.07)
Basophils Absolute: 0 10*3/uL (ref 0.0–0.1)
Basophils Relative: 0 %
Eosinophils Absolute: 0.1 10*3/uL (ref 0.0–0.5)
Eosinophils Relative: 2 %
HCT: 39.5 % (ref 36.0–46.0)
Hemoglobin: 13.1 g/dL (ref 12.0–15.0)
Immature Granulocytes: 0 %
Lymphocytes Relative: 18 %
Lymphs Abs: 1 10*3/uL (ref 0.7–4.0)
MCH: 30.9 pg (ref 26.0–34.0)
MCHC: 33.2 g/dL (ref 30.0–36.0)
MCV: 93.2 fL (ref 80.0–100.0)
Monocytes Absolute: 0.5 10*3/uL (ref 0.1–1.0)
Monocytes Relative: 9 %
Neutro Abs: 3.8 10*3/uL (ref 1.7–7.7)
Neutrophils Relative %: 71 %
Platelets: 204 10*3/uL (ref 150–400)
RBC: 4.24 MIL/uL (ref 3.87–5.11)
RDW: 13.6 % (ref 11.5–15.5)
WBC: 5.3 10*3/uL (ref 4.0–10.5)
nRBC: 0 % (ref 0.0–0.2)

## 2018-09-16 LAB — COMPREHENSIVE METABOLIC PANEL
ALT: 14 U/L (ref 0–44)
AST: 24 U/L (ref 15–41)
Albumin: 4.1 g/dL (ref 3.5–5.0)
Alkaline Phosphatase: 85 U/L (ref 38–126)
Anion gap: 9 (ref 5–15)
BUN: 19 mg/dL (ref 8–23)
CO2: 28 mmol/L (ref 22–32)
Calcium: 9.6 mg/dL (ref 8.9–10.3)
Chloride: 103 mmol/L (ref 98–111)
Creatinine, Ser: 1.12 mg/dL — ABNORMAL HIGH (ref 0.44–1.00)
GFR calc Af Amer: 53 mL/min — ABNORMAL LOW (ref >60)
GFR calc non Af Amer: 46 mL/min — ABNORMAL LOW (ref >60)
Glucose, Bld: 149 mg/dL — ABNORMAL HIGH (ref 70–99)
Potassium: 3.7 mmol/L (ref 3.5–5.1)
Sodium: 140 mmol/L (ref 135–145)
Total Bilirubin: 0.9 mg/dL (ref 0.3–1.2)
Total Protein: 6.8 g/dL (ref 6.5–8.1)

## 2018-09-16 NOTE — Progress Notes (Signed)
Patient here for follow up. Pt has right swelling to right knee. Pulse 53, pt states she has a-fib, but she is asymptomatic.

## 2018-09-16 NOTE — Progress Notes (Signed)
[facility] day:  09/16/2018   Chief Complaint: Susan [name] is a 84 y.o. female with stage IIIC uterine cancer who is seen for 6 month assessment, establish care with me.   PERTINENT ONCOLOGY HISTORY Susan [name] is a 84 y.o.afemale who has above oncology history reviewed by me today presented for follow up visit for history of stage IIIc uterine cancer.  Patient previously followed up with Dr. Mike Gip, switched care to me from 22 2020. Extensive medical records review was performed.  stage IIIC endometrial cancer s/p TLH/BSO and staging on 04/08/2015 at [facility].   Pathology staging was T1 aN1 M0. 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  Tumor invaded 2 mm into a 14 mm thick myometrium.  There was no cervical involvement.   There was lymphovascular invasion.  Washings were negative.  There were 3 positive lymph nodes in the pelvis (left external iliac node: 1 cm focus without extranodal extension; less than 0.1 mm focus in 1 external right iliac node and a 0.5 mm focus in another right external iliac node).  #Status post 2 cycles of carboplatin and Taxol(05/12/2015 - 06/02/2015).  Did not receive planned chemotherapy cycles due to debilitating neuropathy, per note, she received adjuvant 4500 cGy pelvic radiation and 1200 cGy using high dose rate remote afterloading through vaginal cylinder (completed 10/18/2015).  Abdomen and pelvic CT on 08/31/2016 revealed no evidence of recurrent malignancy in the abdomen or pelvis.   Sacral insufficiency fracture in the bilateral sacrum. Abdominal and pelvic CT on 12/12/2017 revealed no evidence of recurrence or metastatic disease.  Small nodule was noted on CTs. Had chest CT on 12/05/2016 which reviewed scattered right lung nodules measuring up to 6 mm in the right upper lobe along the minor fissure.  Metastatic disease was not excluded. Repeat CT on  03/06/2017 reviewed the stability of tiny subcentimeter lung nodules. CT chest on 09/04/2017 reviewed and no acute cardiopulmonary abnormalities.  A small pulmonary nodules were stable.  Largest nodule was 6 mm.  CA125 has been followed:  82.5 on 04/28/2015, 22.0 on 06/02/2015, 18.2 on 07/14/2015, 11.7 on 10/21/2015, 16.4 on 01/21/2016, 14.2 on 04/25/2016, 13.6 on 08/15/2016, 16.2 on 12/07/2016, 16.1 on 03/08/2017, 13.8 on 09/13/2017, and 13.2 on 03/15/2018.  # Lumbar spine MRI on 12/12/2017 revealed severe stenosis at L4-5 related to 5 mm slip, central disc extrusion with cephalad migrated free fragment. Posterior element hypertrophy is superimposed. BILATERAL L5 neural impingement along with RIGHT L4 neural foraminal narrowing was noted. Similar less severe changes at L1-2, L2-3, L3-4, and L5-S1.  There was no evidence of metastatic disease.  #Neuropathy, on Neurontin treatments.  Today patient reports feeling well at baseline.  No new complaints or symptoms since last visit. She states that the radiation has fractured her spine and damaged her bladder.  She has bladder incontinence, wearing diapers.  She is chronically bradycardic, sick sinus syndrome.  Today's heart rate 53.  History of atrial fibrillation.  Says she follows up with her " electrician"-cardiologist to revisit discussions regarding pacemaker placement. Reports asymptomatic.  Denies any dizziness today.  Denies weight loss, fever, chills, fatigue, night sweats.  Recently been seen by Dr.Berchuck. note reviewed.   Review of Systems  Constitutional: Negative for appetite change, chills, fatigue and fever.  HENT:   Negative for hearing loss and voice change.   Eyes: Negative for eye problems.  Respiratory: Negative for chest tightness and cough.   Cardiovascular:  Negative for chest pain.       "chronic Afrib and low heart beats ."  Gastrointestinal: Negative for abdominal distention, abdominal pain and blood in stool.   Endocrine: Negative for hot flashes.  Genitourinary: Negative for difficulty urinating and frequency.   Musculoskeletal: Negative for arthralgias.       Chronic back pain  Skin: Negative for itching and rash.  Neurological: Negative for extremity weakness.  Hematological: Negative for adenopathy.  Psychiatric/Behavioral: Negative for confusion.    Past Medical History:  Diagnosis Date  . Bradycardia   . Chronic back pain   . Coronary artery disease    a. s/p STEMI in 2014 with DES to LAD with staged PCI/DES placement to LCx  . Echoencephalogram abnormality   . Heart murmur   . History of Holter monitoring   . Hyperlipidemia   . Hypertension   . Myocardial infarction ([place])   . Neuromuscular disorder (H[facility])    neuropathy  . NSVT (nonsustained ventricular tachycardia) ([place])   . Personal history of chemotherapy    ENDOMETRIAL CA  . Personal history of radiation therapy    ENDOMETRIAL CA  . Uterine cancer ([place])    serrous    Past Surgical History:  Procedure Laterality Date  . ABDOMINAL HYSTERECTOMY    . BREAST EXCISIONAL BIOPSY Right 1956   NEG  . CARDIOVERSION N/A 07/27/2017   Procedure: CARDIOVERSION;  Surgeon: Skeet Latch, MD;  Location: [facility];  Service: Cardiovascular;  Laterality: N/A;  . LEFT HEART CATHETERIZATION WITH CORONARY ANGIOGRAM  10/19/2012   Procedure: LEFT HEART CATHETERIZATION WITH CORONARY ANGIOGRAM;  Surgeon: Troy Sine, MD;  Location: [facility] CATH LAB;  Service: Cardiovascular;;  . PERCUTANEOUS CORONARY STENT INTERVENTION (PCI-S)  10/19/2012   Procedure: PERCUTANEOUS CORONARY STENT INTERVENTION (PCI-S);  Surgeon: Troy Sine, MD;  Location: [facility] CATH LAB;  Service: Cardiovascular;;  . PERCUTANEOUS CORONARY STENT INTERVENTION (PCI-S) N/A 10/23/2012   Procedure: PERCUTANEOUS CORONARY STENT INTERVENTION (PCI-S);  Surgeon: Troy Sine, MD;  Location: [facility] CATH LAB;  Service: Cardiovascular;  Laterality: N/A;  . PERIPHERAL VASCULAR CATHETERIZATION  N/A 05/05/2015   Procedure: Glori Luis Cath Insertion;  Surgeon: Algernon Huxley, MD;  Location: [facility] CV LAB;  Service: Cardiovascular;  Laterality: N/A;  . PORTA CATH REMOVAL N/A 04/05/2017   Procedure: PORTA CATH REMOVAL;  Surgeon: Algernon Huxley, MD;  Location: [facility] CV LAB;  Service: Cardiovascular;  Laterality: N/A;    Family History  Problem Relation Age of Onset  . Heart disease Mother   . Breast cancer Mother 33  . Heart disease Father   . Diabetes Father   . Diabetes Brother   . Stroke Brother   . Kidney cancer Neg Hx   . Bladder Cancer Neg Hx     Social History   Socioeconomic History  . Marital status: Married    Spouse name: Not on file  . Number of children: Not on file  . Years of education: Not on file  . Highest education level: Not on file  Occupational History  . Not on file  Social Needs  . Financial resource strain: Not on file  . Food insecurity    Worry: Not on file    Inability: Not on file  . Transportation needs    Medical: Not on file    Non-medical: Not on file  Tobacco Use  . Smoking status: Former Smoker    Quit date: 1976    Years since quitting: 44.5  . Smokeless tobacco:  Never Used  Substance and Sexual Activity  . Alcohol use: Yes    Alcohol/week: 1.0 standard drinks    Types: 1 Glasses of wine per week  . Drug use: No  . Sexual activity: Yes  Lifestyle  . Physical activity    Days per week: Not on file    Minutes per session: Not on file  . Stress: Not on file  Relationships  . Social Herbalist on phone: Not on file    Gets together: Not on file    Attends religious service: Not on file    Active member of club or organization: Not on file    Attends meetings of clubs or organizations: Not on file    Relationship status: Not on file  . Intimate partner violence    Fear of current or ex partner: Not on file    Emotionally abused: Not on file    Physically abused: Not on file    Forced sexual activity: Not  on file  Other Topics Concern  . Not on file  Social History Narrative  . Not on file   She is originally from [place].  She lives in [place].  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  She and her husband will be married 68 years in 11/2016.  Her husband's name is [name].  She has a trip planned in August (10 day cruise). The patient is alone today.  Allergies:  Allergies  Allergen Reactions  . Ace Inhibitors Cough  . Penicillins Rash and Other (See Comments)    Has patient had a PCN reaction causing immediate rash, facial/tongue/throat swelling, SOB or lightheadedness with hypotension: No Has patient had a PCN reaction causing severe rash involving mucus membranes or skin necrosis: No Has patient had a PCN reaction that required hospitalization: No Has patient had a PCN reaction occurring within the last 10 years: No If all of the above answers are "NO", then may proceed with Cephalosporin use.     Current Medications: Current Outpatient Medications  Medication Sig Dispense Refill  . acetaminophen (TYLENOL) 650 MG CR tablet Take 650 mg by mouth every 8 (eight) hours as needed for pain.    [name] aspirin EC 81 MG tablet Take 81 mg by mouth daily.    [name] atorvastatin (LIPITOR) 80 MG tablet TAKE 1 TABLET (80 MG TOTAL) BY MOUTH DAILY AT 6 PM. 90 tablet 2  . ELIQUIS 5 MG TABS tablet TAKE 1 TABLET BY MOUTH TWICE A DAY 180 tablet 1  . ezetimibe (ZETIA) 10 MG tablet TAKE 1 TABLET BY MOUTH EVERY DAY 90 tablet 0  . fluticasone (FLONASE) 50 MCG/ACT nasal spray SPRAY 2 SPRAYS INTO EACH NOSTRIL EVERY DAY    . isosorbide mononitrate (IMDUR) 30 MG 24 hr tablet TAKE 1 TABLET (30 MG TOTAL) BY MOUTH DAILY. 90 tablet 1  . loperamide (IMODIUM A-D) 2 MG tablet Take 2 mg by mouth 4 (four) times daily as needed for diarrhea or loose stools.    [name] losartan (COZAAR) 25 MG tablet TAKE 2 TABLETS BY MOUTH EVERY DAY 180 tablet 1  . Multiple Vitamin (MULTIVITAMIN WITH MINERALS) TABS Take 1  tablet by mouth daily.    [name] NITROSTAT 0.4 MG SL tablet PLACE 1 TABLET UNDER THE TOUGUE EVERY 5 MINUTES FOR UP TO 3 DOSES AS NEEDED FOR CHEST PAIN 25 tablet 0  . omeprazole (PRILOSEC) 20 MG capsule Take 20 mg by mouth daily.    [name]  oxybutynin (DITROPAN-XL) 10 MG 24 hr tablet Take 1 tablet (10 mg total) by mouth daily. 90 tablet 3  . vitamin B-12 (CYANOCOBALAMIN) 1000 MCG tablet Take 1,000 mcg by mouth daily.    . furosemide (LASIX) 20 MG tablet Take by mouth.     No current facility-administered medications for this visit.    Facility-Administered Medications Ordered in Other Visits  Medication Dose Route Frequency Provider Last Rate Last Dose  . sodium chloride flush (NS) 0.9 % injection 10 mL  10 mL Intravenous PRN Cammie Sickle, MD   10 mL at 06/06/16 1447  . sodium chloride flush (NS) 0.9 % injection 10 mL  10 mL Intravenous PRN Cammie Sickle, MD   10 mL at 08/15/16 1404       Physical Exam:  Blood pressure 114/73, pulse (!) 53, temperature (!) 96 F (35.6 C), resp. rate 18, weight 206 lb 9.6 oz (93.7 kg). Physical Exam  Constitutional: She is oriented to person, place, and time. No distress.  Obese.   HENT:  Head: Normocephalic and atraumatic.  Nose: Nose normal.  Mouth/Throat: Oropharynx is clear and moist. No oropharyngeal exudate.  Eyes: Pupils are equal, round, and reactive to light. EOM are normal. No scleral icterus.  Neck: Normal range of motion. Neck supple.  Cardiovascular: Normal rate and regular rhythm.  No murmur heard. Pulmonary/Chest: Effort normal. No respiratory distress. She has no rales. She exhibits no tenderness.  Abdominal: Soft. She exhibits no distension. There is no abdominal tenderness.  Musculoskeletal: Normal range of motion.        General: No edema.  Neurological: She is alert and oriented to person, place, and time. No cranial nerve deficit. She exhibits normal muscle tone. Coordination normal.  Skin: Skin is warm and dry. She is not  diaphoretic. No erythema.  Psychiatric: Affect normal.    Appointment on 09/16/2018  Component Date Value Ref Range Status  . Sodium 09/16/2018 140  135 - 145 mmol/L Final  . Potassium 09/16/2018 3.7  3.5 - 5.1 mmol/L Final  . Chloride 09/16/2018 103  98 - 111 mmol/L Final  . CO2 09/16/2018 28  22 - 32 mmol/L Final  . Glucose, Bld 09/16/2018 149* 70 - 99 mg/dL Final  . BUN 09/16/2018 19  8 - 23 mg/dL Final  . Creatinine, Ser 09/16/2018 1.12* 0.44 - 1.00 mg/dL Final  . Calcium 09/16/2018 9.6  8.9 - 10.3 mg/dL Final  . Total Protein 09/16/2018 6.8  6.5 - 8.1 g/dL Final  . Albumin 09/16/2018 4.1  3.5 - 5.0 g/dL Final  . AST 09/16/2018 24  15 - 41 U/L Final  . ALT 09/16/2018 14  0 - 44 U/L Final  . Alkaline Phosphatase 09/16/2018 85  38 - 126 U/L Final  . Total Bilirubin 09/16/2018 0.9  0.3 - 1.2 mg/dL Final  . GFR calc non Af Amer 09/16/2018 46* >60 mL/min Final  . GFR calc Af Amer 09/16/2018 53* >60 mL/min Final  . Anion gap 09/16/2018 9  5 - 15 Final   Performed at [facility], [address] [place], [place] [address]  . WBC 09/16/2018 5.3  4.0 - 10.5 K/uL Final  . RBC 09/16/2018 4.24  3.87 - 5.11 MIL/uL Final  . Hemoglobin 09/16/2018 13.1  12.0 - 15.0 g/dL Final  . HCT 09/16/2018 39.5  36.0 - 46.0 % Final  . MCV 09/16/2018 93.2  80.0 - 100.0 fL Final  . MCH 09/16/2018 30.9  26.0 - 34.0 pg Final  .  MCHC 09/16/2018 33.2  30.0 - 36.0 g/dL Final  . RDW 09/16/2018 13.6  11.5 - 15.5 % Final  . Platelets 09/16/2018 204  150 - 400 K/uL Final  . nRBC 09/16/2018 0.0  0.0 - 0.2 % Final  . Neutrophils Relative % 09/16/2018 71  % Final  . Neutro Abs 09/16/2018 3.8  1.7 - 7.7 K/uL Final  . Lymphocytes Relative 09/16/2018 18  % Final  . Lymphs Abs 09/16/2018 1.0  0.7 - 4.0 K/uL Final  . Monocytes Relative 09/16/2018 9  % Final  . Monocytes Absolute 09/16/2018 0.5  0.1 - 1.0 K/uL Final  . Eosinophils Relative 09/16/2018 2  % Final  . Eosinophils Absolute 09/16/2018 0.1  0.0 - 0.5  K/uL Final  . Basophils Relative 09/16/2018 0  % Final  . Basophils Absolute 09/16/2018 0.0  0.0 - 0.1 K/uL Final  . Immature Granulocytes 09/16/2018 0  % Final  . Abs Immature Granulocytes 09/16/2018 0.01  0.00 - 0.07 K/uL Final   Performed at [facility], [address] [place], [place] [address]    Assessment:  Susan [name] is a 84 y.o. female with history of stage IIIc uterus cancer presents for follow-up. 1. Malignant neoplasm of uterus, unspecified site ([place])   2. Pulmonary nodules   3. Chemotherapy-induced peripheral neuropathy ([place])   4. AKI (acute kidney injury) ([place])    #Stage IIIc uterine cancer, status post surgery and 2 cycles of adjuvant chemotherapy and adjuvant radiation. Clinically stable.  No new symptoms. Ca1 25 pending. Labs are reviewed and discussed with patient.CT abdomen pelvis as indicated.   #Right lung nodules, last CT chest was done 1 year ago.  Recommend to repeat CT chest.  #Bradycardia, sick sinus syndrome.  She is asymptomatic.  Continue follow-up with cardiology. #Slightly elevated creatinine, GFR decreased to 46. Recommend patient avoid NSAIDs, improve oral hydration.  Repeat BMP in 1 week. #Chemotherapy-induced neuropathy, continue gabapentin. We spent sufficient time to discuss many aspect of care, questions were answered to patient's satisfaction. Total face to face encounter time for this patient visit was 25 min. >50% of the time was  spent in counseling and coordination of care.   Follow up in 3 months to discuss CT scans.  We spent sufficient time to discuss many aspect of care, questions were answered to patient's satisfaction. Total face to face encounter time for this patient visit was 25 min. >50% of the time was  spent in counseling and coordination of care.    Earlie Server, MD  09/16/2018, 9:53 PM

## 2018-09-17 LAB — CA 125: Cancer Antigen (CA) 125: 15.7 U/mL (ref 0.0–38.1)

## 2018-09-23 ENCOUNTER — Other Ambulatory Visit: Payer: Medicare Other

## 2018-09-23 ENCOUNTER — Other Ambulatory Visit: Payer: Self-pay

## 2018-09-24 ENCOUNTER — Other Ambulatory Visit: Payer: Self-pay

## 2018-09-24 ENCOUNTER — Inpatient Hospital Stay: Payer: Medicare Other

## 2018-09-24 DIAGNOSIS — Z8542 Personal history of malignant neoplasm of other parts of uterus: Secondary | ICD-10-CM | POA: Diagnosis not present

## 2018-09-24 DIAGNOSIS — C55 Malignant neoplasm of uterus, part unspecified: Secondary | ICD-10-CM

## 2018-09-24 LAB — BASIC METABOLIC PANEL
Anion gap: 8 (ref 5–15)
BUN: 20 mg/dL (ref 8–23)
CO2: 27 mmol/L (ref 22–32)
Calcium: 9.5 mg/dL (ref 8.9–10.3)
Chloride: 106 mmol/L (ref 98–111)
Creatinine, Ser: 0.94 mg/dL (ref 0.44–1.00)
GFR calc Af Amer: >60 mL/min (ref >60)
GFR calc non Af Amer: 57 mL/min — ABNORMAL LOW (ref >60)
Glucose, Bld: 104 mg/dL — ABNORMAL HIGH (ref 70–99)
Potassium: 4.1 mmol/L (ref 3.5–5.1)
Sodium: 141 mmol/L (ref 135–145)

## 2018-10-07 ENCOUNTER — Telehealth: Payer: Self-pay | Admitting: Cardiovascular Disease

## 2018-10-07 ENCOUNTER — Encounter: Payer: Self-pay | Admitting: Cardiology

## 2018-10-07 NOTE — Telephone Encounter (Signed)
Pt takes Eliquis for afib with CHADS2VASc score of 5 (age x2, sex, HTN, CAD). Renal function is normal. Ok to hold Eliquis for 1 day prior to 2 extractions, resume within 24 hours after.

## 2018-10-07 NOTE — Telephone Encounter (Signed)
New Message      1. What dental office are you calling from? [facility]   2. What is your office phone number? [phone]  3. What is your fax number? [phone]  4. What type of procedure is the patient having performed? 2 extractions    5. What date is procedure scheduled or is the patient there now? 10/24/18 (if the patient is at the dentist's office question goes to their cardiologist if he/she is in the office.  If not, question should go to the DOD).   6. What is your question (ex. Antibiotics prior to procedure, holding medication-we need to know how long dentist wants pt to hold med)? Eliquis, how many days prior and how many days after to start again

## 2018-10-07 NOTE — Telephone Encounter (Signed)
Pharm  Pt for 2 dental extractions, please address Eliquis thanks.

## 2018-10-09 NOTE — Telephone Encounter (Signed)
   Primary Cardiologist: Shelva Majestic, MD  Chart reviewed as part of pre-operative protocol coverage. Patient was contacted 10/09/2018 in reference to pre-operative risk assessment for pending surgery as outlined below.  Susan [name] was last seen on 05/20/18 by [name] for a fib and bradycardia and CAD.  Since that day, Susan [name] has done very well with no changes in activity.  Therefore, based on ACC/AHA guidelines, the patient would be at acceptable risk for the planned procedure without further cardiovascular testing.  She may hold eliquis 1 day prior to 2 extractions and resume without 24 hours after. No need for antibiotics.  I will route this recommendation to the requesting party via Epic fax function and remove from pre-op pool.  Please call with questions.  Cecilie Kicks, NP 10/09/2018, 11:42 AM

## 2018-11-01 ENCOUNTER — Other Ambulatory Visit: Payer: Self-pay | Admitting: Cardiovascular Disease

## 2018-11-01 DIAGNOSIS — I2510 Atherosclerotic heart disease of native coronary artery without angina pectoris: Secondary | ICD-10-CM

## 2018-11-19 DIAGNOSIS — R200 Anesthesia of skin: Secondary | ICD-10-CM | POA: Insufficient documentation

## 2018-11-19 DIAGNOSIS — M7989 Other specified soft tissue disorders: Secondary | ICD-10-CM | POA: Insufficient documentation

## 2018-11-19 DIAGNOSIS — M79605 Pain in left leg: Secondary | ICD-10-CM | POA: Insufficient documentation

## 2018-12-16 ENCOUNTER — Inpatient Hospital Stay (HOSPITAL_BASED_OUTPATIENT_CLINIC_OR_DEPARTMENT_OTHER): Payer: Medicare Other | Admitting: Oncology

## 2018-12-16 ENCOUNTER — Inpatient Hospital Stay: Payer: Medicare Other | Attending: Oncology

## 2018-12-16 ENCOUNTER — Other Ambulatory Visit: Payer: Self-pay

## 2018-12-16 ENCOUNTER — Encounter: Payer: Self-pay | Admitting: Oncology

## 2018-12-16 VITALS — BP 151/62 | HR 57 | Temp 97.4°F | Resp 16 | Wt 207.6 lb

## 2018-12-16 DIAGNOSIS — R918 Other nonspecific abnormal finding of lung field: Secondary | ICD-10-CM

## 2018-12-16 DIAGNOSIS — I4891 Unspecified atrial fibrillation: Secondary | ICD-10-CM | POA: Insufficient documentation

## 2018-12-16 DIAGNOSIS — C55 Malignant neoplasm of uterus, part unspecified: Secondary | ICD-10-CM

## 2018-12-16 DIAGNOSIS — T451X5A Adverse effect of antineoplastic and immunosuppressive drugs, initial encounter: Secondary | ICD-10-CM

## 2018-12-16 DIAGNOSIS — Z79899 Other long term (current) drug therapy: Secondary | ICD-10-CM | POA: Insufficient documentation

## 2018-12-16 DIAGNOSIS — G620 Drug-induced polyneuropathy: Secondary | ICD-10-CM | POA: Insufficient documentation

## 2018-12-16 DIAGNOSIS — Z7901 Long term (current) use of anticoagulants: Secondary | ICD-10-CM | POA: Insufficient documentation

## 2018-12-16 DIAGNOSIS — C541 Malignant neoplasm of endometrium: Secondary | ICD-10-CM | POA: Insufficient documentation

## 2018-12-16 DIAGNOSIS — I10 Essential (primary) hypertension: Secondary | ICD-10-CM | POA: Diagnosis not present

## 2018-12-16 DIAGNOSIS — I495 Sick sinus syndrome: Secondary | ICD-10-CM | POA: Diagnosis not present

## 2018-12-16 DIAGNOSIS — R32 Unspecified urinary incontinence: Secondary | ICD-10-CM | POA: Diagnosis not present

## 2018-12-16 LAB — COMPREHENSIVE METABOLIC PANEL
ALT: 18 U/L (ref 0–44)
AST: 25 U/L (ref 15–41)
Albumin: 4 g/dL (ref 3.5–5.0)
Alkaline Phosphatase: 91 U/L (ref 38–126)
Anion gap: 9 (ref 5–15)
BUN: 19 mg/dL (ref 8–23)
CO2: 26 mmol/L (ref 22–32)
Calcium: 9.5 mg/dL (ref 8.9–10.3)
Chloride: 105 mmol/L (ref 98–111)
Creatinine, Ser: 0.96 mg/dL (ref 0.44–1.00)
GFR calc Af Amer: >60 mL/min (ref >60)
GFR calc non Af Amer: 55 mL/min — ABNORMAL LOW (ref >60)
Glucose, Bld: 137 mg/dL — ABNORMAL HIGH (ref 70–99)
Potassium: 3.8 mmol/L (ref 3.5–5.1)
Sodium: 140 mmol/L (ref 135–145)
Total Bilirubin: 0.7 mg/dL (ref 0.3–1.2)
Total Protein: 7 g/dL (ref 6.5–8.1)

## 2018-12-16 LAB — CBC WITH DIFFERENTIAL/PLATELET
Abs Immature Granulocytes: 0.02 10*3/uL (ref 0.00–0.07)
Basophils Absolute: 0 10*3/uL (ref 0.0–0.1)
Basophils Relative: 0 %
Eosinophils Absolute: 0.1 10*3/uL (ref 0.0–0.5)
Eosinophils Relative: 2 %
HCT: 38.1 % (ref 36.0–46.0)
Hemoglobin: 12.8 g/dL (ref 12.0–15.0)
Immature Granulocytes: 0 %
Lymphocytes Relative: 17 %
Lymphs Abs: 1 10*3/uL (ref 0.7–4.0)
MCH: 31.1 pg (ref 26.0–34.0)
MCHC: 33.6 g/dL (ref 30.0–36.0)
MCV: 92.5 fL (ref 80.0–100.0)
Monocytes Absolute: 0.5 10*3/uL (ref 0.1–1.0)
Monocytes Relative: 9 %
Neutro Abs: 4.3 10*3/uL (ref 1.7–7.7)
Neutrophils Relative %: 72 %
Platelets: 198 10*3/uL (ref 150–400)
RBC: 4.12 MIL/uL (ref 3.87–5.11)
RDW: 13.1 % (ref 11.5–15.5)
WBC: 6 10*3/uL (ref 4.0–10.5)
nRBC: 0 % (ref 0.0–0.2)

## 2018-12-16 NOTE — Progress Notes (Signed)
Patient has bilateral LE neuropathy.

## 2018-12-16 NOTE — Progress Notes (Signed)
[facility] day:  12/16/2018   Chief Complaint: Susan [name] is a 84 y.o. female with stage IIIC uterine cancer who is seen for 6 month assessment, establish care with me.   PERTINENT ONCOLOGY HISTORY Susan [name] is a 84 y.o.afemale who has above oncology history reviewed by me today presented for follow up visit for history of stage IIIc uterine cancer.  Patient previously followed up with Dr. Mike Gip, switched care to me from 22 2020. Extensive medical records review was performed.  stage IIIC endometrial cancer s/p TLH/BSO and staging on 04/08/2015 at [facility].   Pathology staging was T1 aN1 M0. 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  Tumor invaded 2 mm into a 14 mm thick myometrium.  There was no cervical involvement.   There was lymphovascular invasion.  Washings were negative.  There were 3 positive lymph nodes in the pelvis (left external iliac node: 1 cm focus without extranodal extension; less than 0.1 mm focus in 1 external right iliac node and a 0.5 mm focus in another right external iliac node).  #Status post 2 cycles of carboplatin and Taxol(05/12/2015 - 06/02/2015).  Did not receive planned chemotherapy cycles due to debilitating neuropathy, per note, she received adjuvant 4500 cGy pelvic radiation and 1200 cGy using high dose rate remote afterloading through vaginal cylinder (completed 10/18/2015).  Abdomen and pelvic CT on 08/31/2016 revealed no evidence of recurrent malignancy in the abdomen or pelvis.   Sacral insufficiency fracture in the bilateral sacrum. Abdominal and pelvic CT on 12/12/2017 revealed no evidence of recurrence or metastatic disease.  Small nodule was noted on CTs. Had chest CT on 12/05/2016 which reviewed scattered right lung nodules measuring up to 6 mm in the right upper lobe along the minor fissure.  Metastatic disease was not excluded. Repeat CT on  03/06/2017 reviewed the stability of tiny subcentimeter lung nodules. CT chest on 09/04/2017 reviewed and no acute cardiopulmonary abnormalities.  A small pulmonary nodules were stable.  Largest nodule was 6 mm.  CA125 has been followed:  82.5 on 04/28/2015, 22.0 on 06/02/2015, 18.2 on 07/14/2015, 11.7 on 10/21/2015, 16.4 on 01/21/2016, 14.2 on 04/25/2016, 13.6 on 08/15/2016, 16.2 on 12/07/2016, 16.1 on 03/08/2017, 13.8 on 09/13/2017, and 13.2 on 03/15/2018.  # Lumbar spine MRI on 12/12/2017 revealed severe stenosis at L4-5 related to 5 mm slip, central disc extrusion with cephalad migrated free fragment. Posterior element hypertrophy is superimposed. BILATERAL L5 neural impingement along with RIGHT L4 neural foraminal narrowing was noted. Similar less severe changes at L1-2, L2-3, L3-4, and L5-S1.  There was no evidence of metastatic disease.  #Neuropathy, on Neurontin treatments.  Today patient reports feeling well at baseline.  No new complaints or symptoms since last visit. She states that the radiation has fractured her spine and damaged her bladder.  She has bladder incontinence, wearing diapers.  She is chronically bradycardic, sick sinus syndrome.  Today's heart rate 53.  History of atrial fibrillation.  Says she follows up with her " electrician"-cardiologist to revisit discussions regarding pacemaker placement. Reports asymptomatic.  Denies any dizziness today.  Denies weight loss, fever, chills, fatigue, night sweats.  Recently been seen by Dr.Berchuck. note reviewed.   INTERVAL HISTORY Susan [name] is a 84 y.o. female who has above history reviewed by me today presents for follow up visit for management of  history of endometrial cancer. Problems and complaints are listed below: Patient was seen by GYN  oncology Dr. Fransisca Connors in May 2020.  Notes were reviewed.  Normal pelvic exam. Patient denies any unintentional weight loss, new bone pain, abdominal pain, spotting. Patient has  history of chronic back pain due to severe lumbar spine stenosis.  She has chronic neuropathy.  Today she feels that her left lower extremity neuropathy is worse.  She was previously on gabapentin which she felt that has not been very helpful so she stopped taking it.  She follows up with Dr. Holley Raring for pain management.   Review of Systems  Constitutional: Negative for appetite change, chills, fatigue and fever.  HENT:   Negative for hearing loss and voice change.   Eyes: Negative for eye problems.  Respiratory: Negative for chest tightness and cough.   Cardiovascular: Negative for chest pain.       "chronic Afrib and low heart beats ."  Gastrointestinal: Negative for abdominal distention, abdominal pain and blood in stool.  Endocrine: Negative for hot flashes.  Genitourinary: Negative for difficulty urinating and frequency.   Musculoskeletal: Negative for arthralgias.       Chronic back pain  Skin: Negative for itching and rash.  Neurological: Positive for numbness. Negative for extremity weakness.  Hematological: Negative for adenopathy.  Psychiatric/Behavioral: Negative for confusion.    Past Medical History:  Diagnosis Date  . Bradycardia   . Chronic back pain   . Coronary artery disease    a. s/p STEMI in 2014 with DES to LAD with staged PCI/DES placement to LCx  . Echoencephalogram abnormality   . Heart murmur   . History of Holter monitoring   . Hyperlipidemia   . Hypertension   . Myocardial infarction ([place])   . Neuromuscular disorder (H[facility])    neuropathy  . NSVT (nonsustained ventricular tachycardia) ([facility])   . Personal history of chemotherapy    ENDOMETRIAL CA  . Personal history of radiation therapy    ENDOMETRIAL CA  . Uterine cancer ([place])    serrous    Past Surgical History:  Procedure Laterality Date  . ABDOMINAL HYSTERECTOMY    . BREAST EXCISIONAL BIOPSY Right 1956   NEG  . CARDIOVERSION N/A 07/27/2017   Procedure: CARDIOVERSION;  Surgeon: Skeet Latch, MD;  Location: [facility];  Service: Cardiovascular;  Laterality: N/A;  . LEFT HEART CATHETERIZATION WITH CORONARY ANGIOGRAM  10/19/2012   Procedure: LEFT HEART CATHETERIZATION WITH CORONARY ANGIOGRAM;  Surgeon: Troy Sine, MD;  Location: [facility] CATH LAB;  Service: Cardiovascular;;  . PERCUTANEOUS CORONARY STENT INTERVENTION (PCI-S)  10/19/2012   Procedure: PERCUTANEOUS CORONARY STENT INTERVENTION (PCI-S);  Surgeon: Troy Sine, MD;  Location: [facility] CATH LAB;  Service: Cardiovascular;;  . PERCUTANEOUS CORONARY STENT INTERVENTION (PCI-S) N/A 10/23/2012   Procedure: PERCUTANEOUS CORONARY STENT INTERVENTION (PCI-S);  Surgeon: Troy Sine, MD;  Location: [facility] CATH LAB;  Service: Cardiovascular;  Laterality: N/A;  . PERIPHERAL VASCULAR CATHETERIZATION N/A 05/05/2015   Procedure: Glori Luis Cath Insertion;  Surgeon: Algernon Huxley, MD;  Location: [facility] CV LAB;  Service: Cardiovascular;  Laterality: N/A;  . PORTA CATH REMOVAL N/A 04/05/2017   Procedure: PORTA CATH REMOVAL;  Surgeon: Algernon Huxley, MD;  Location: [facility] CV LAB;  Service: Cardiovascular;  Laterality: N/A;    Family History  Problem Relation Age of Onset  . Heart disease Mother   . Breast cancer Mother 35  . Heart disease Father   . Diabetes Father   . Diabetes Brother   . Stroke Brother   . Kidney cancer Neg Hx   .  Bladder Cancer Neg Hx     Social History   Socioeconomic History  . Marital status: Married    Spouse name: Not on file  . Number of children: Not on file  . Years of education: Not on file  . Highest education level: Not on file  Occupational History  . Not on file  Social Needs  . Financial resource strain: Not on file  . Food insecurity    Worry: Not on file    Inability: Not on file  . Transportation needs    Medical: Not on file    Non-medical: Not on file  Tobacco Use  . Smoking status: Former Smoker    Quit date: 1976    Years since quitting: 44.7  . Smokeless tobacco: Never Used   Substance and Sexual Activity  . Alcohol use: Yes    Alcohol/week: 1.0 standard drinks    Types: 1 Glasses of wine per week  . Drug use: No  . Sexual activity: Yes  Lifestyle  . Physical activity    Days per week: Not on file    Minutes per session: Not on file  . Stress: Not on file  Relationships  . Social Herbalist on phone: Not on file    Gets together: Not on file    Attends religious service: Not on file    Active member of club or organization: Not on file    Attends meetings of clubs or organizations: Not on file    Relationship status: Not on file  . Intimate partner violence    Fear of current or ex partner: Not on file    Emotionally abused: Not on file    Physically abused: Not on file    Forced sexual activity: Not on file  Other Topics Concern  . Not on file  Social History Narrative  . Not on file   She is originally from [place].  She lives in [place].  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  She and her husband will be married 53 years in 11/2016.  Her husband's name is [name].  She has a trip planned in August (10 day cruise). The patient is alone today.  Allergies:  Allergies  Allergen Reactions  . Ace Inhibitors Cough  . Penicillins Rash and Other (See Comments)    Has patient had a PCN reaction causing immediate rash, facial/tongue/throat swelling, SOB or lightheadedness with hypotension: No Has patient had a PCN reaction causing severe rash involving mucus membranes or skin necrosis: No Has patient had a PCN reaction that required hospitalization: No Has patient had a PCN reaction occurring within the last 10 years: No If all of the above answers are "NO", then may proceed with Cephalosporin use.     Current Medications: Current Outpatient Medications  Medication Sig Dispense Refill  . acetaminophen (TYLENOL) 650 MG CR tablet Take 650 mg by mouth every 8 (eight) hours as needed for pain.    [name] aspirin EC  81 MG tablet Take 81 mg by mouth daily.    [name] atorvastatin (LIPITOR) 80 MG tablet TAKE 1 TABLET (80 MG TOTAL) BY MOUTH DAILY AT 6 PM. 90 tablet 2  . ELIQUIS 5 MG TABS tablet TAKE 1 TABLET BY MOUTH TWICE A DAY 180 tablet 1  . ezetimibe (ZETIA) 10 MG tablet TAKE 1 TABLET BY MOUTH EVERY DAY 90 tablet 1  . fluticasone (FLONASE) 50 MCG/ACT nasal spray SPRAY 2 SPRAYS  INTO EACH NOSTRIL EVERY DAY    . furosemide (LASIX) 20 MG tablet Take by mouth.    . isosorbide mononitrate (IMDUR) 30 MG 24 hr tablet TAKE 1 TABLET (30 MG TOTAL) BY MOUTH DAILY. 90 tablet 1  . loperamide (IMODIUM A-D) 2 MG tablet Take 2 mg by mouth 4 (four) times daily as needed for diarrhea or loose stools.    [name] losartan (COZAAR) 25 MG tablet TAKE 2 TABLETS BY MOUTH EVERY DAY 180 tablet 1  . Multiple Vitamin (MULTIVITAMIN WITH MINERALS) TABS Take 1 tablet by mouth daily.    [name] NITROSTAT 0.4 MG SL tablet PLACE 1 TABLET UNDER THE TOUGUE EVERY 5 MINUTES FOR UP TO 3 DOSES AS NEEDED FOR CHEST PAIN 25 tablet 0  . omeprazole (PRILOSEC) 20 MG capsule Take 20 mg by mouth daily.    [name] oxybutynin (DITROPAN-XL) 10 MG 24 hr tablet Take 1 tablet (10 mg total) by mouth daily. 90 tablet 3  . vitamin B-12 (CYANOCOBALAMIN) 1000 MCG tablet Take 1,000 mcg by mouth daily.     No current facility-administered medications for this visit.    Facility-Administered Medications Ordered in Other Visits  Medication Dose Route Frequency Provider Last Rate Last Dose  . sodium chloride flush (NS) 0.9 % injection 10 mL  10 mL Intravenous PRN Cammie Sickle, MD   10 mL at 06/06/16 1447  . sodium chloride flush (NS) 0.9 % injection 10 mL  10 mL Intravenous PRN Cammie Sickle, MD   10 mL at 08/15/16 1404       Physical Exam:  Blood pressure (!) 151/62, pulse (!) 57, temperature (!) 97.4 F (36.3 C), resp. rate 16, weight 207 lb 9.6 oz (94.2 kg). Physical Exam  Constitutional: She is oriented to person, place, and time. No distress.  Obese.   HENT:   Head: Normocephalic and atraumatic.  Nose: Nose normal.  Mouth/Throat: Oropharynx is clear and moist. No oropharyngeal exudate.  Eyes: Pupils are equal, round, and reactive to light. EOM are normal. No scleral icterus.  Neck: Normal range of motion. Neck supple.  Cardiovascular: Normal rate and regular rhythm.  No murmur heard. Pulmonary/Chest: Effort normal. No respiratory distress. She has no rales. She exhibits no tenderness.  Abdominal: Soft. She exhibits no distension. There is no abdominal tenderness.  Musculoskeletal: Normal range of motion.        General: No edema.  Neurological: She is alert and oriented to person, place, and time. No cranial nerve deficit. She exhibits normal muscle tone. Coordination normal.  Skin: Skin is warm and dry. She is not diaphoretic. No erythema.  Psychiatric: Affect normal.    Appointment on 12/16/2018  Component Date Value Ref Range Status  . Sodium 12/16/2018 140  135 - 145 mmol/L Final  . Potassium 12/16/2018 3.8  3.5 - 5.1 mmol/L Final  . Chloride 12/16/2018 105  98 - 111 mmol/L Final  . CO2 12/16/2018 26  22 - 32 mmol/L Final  . Glucose, Bld 12/16/2018 137* 70 - 99 mg/dL Final  . BUN 12/16/2018 19  8 - 23 mg/dL Final  . Creatinine, Ser 12/16/2018 0.96  0.44 - 1.00 mg/dL Final  . Calcium 12/16/2018 9.5  8.9 - 10.3 mg/dL Final  . Total Protein 12/16/2018 7.0  6.5 - 8.1 g/dL Final  . Albumin 12/16/2018 4.0  3.5 - 5.0 g/dL Final  . AST 12/16/2018 25  15 - 41 U/L Final  . ALT 12/16/2018 18  0 - 44 U/L Final  .  Alkaline Phosphatase 12/16/2018 91  38 - 126 U/L Final  . Total Bilirubin 12/16/2018 0.7  0.3 - 1.2 mg/dL Final  . GFR calc non Af Amer 12/16/2018 55* >60 mL/min Final  . GFR calc Af Amer 12/16/2018 >60  >60 mL/min Final  . Anion gap 12/16/2018 9  5 - 15 Final   Performed at [facility], [address] [place], [place] [address]  . WBC 12/16/2018 6.0  4.0 - 10.5 K/uL Final  . RBC 12/16/2018 4.12  3.87 - 5.11 MIL/uL Final   . Hemoglobin 12/16/2018 12.8  12.0 - 15.0 g/dL Final  . HCT 12/16/2018 38.1  36.0 - 46.0 % Final  . MCV 12/16/2018 92.5  80.0 - 100.0 fL Final  . MCH 12/16/2018 31.1  26.0 - 34.0 pg Final  . MCHC 12/16/2018 33.6  30.0 - 36.0 g/dL Final  . RDW 12/16/2018 13.1  11.5 - 15.5 % Final  . Platelets 12/16/2018 198  150 - 400 K/uL Final  . nRBC 12/16/2018 0.0  0.0 - 0.2 % Final  . Neutrophils Relative % 12/16/2018 72  % Final  . Neutro Abs 12/16/2018 4.3  1.7 - 7.7 K/uL Final  . Lymphocytes Relative 12/16/2018 17  % Final  . Lymphs Abs 12/16/2018 1.0  0.7 - 4.0 K/uL Final  . Monocytes Relative 12/16/2018 9  % Final  . Monocytes Absolute 12/16/2018 0.5  0.1 - 1.0 K/uL Final  . Eosinophils Relative 12/16/2018 2  % Final  . Eosinophils Absolute 12/16/2018 0.1  0.0 - 0.5 K/uL Final  . Basophils Relative 12/16/2018 0  % Final  . Basophils Absolute 12/16/2018 0.0  0.0 - 0.1 K/uL Final  . Immature Granulocytes 12/16/2018 0  % Final  . Abs Immature Granulocytes 12/16/2018 0.02  0.00 - 0.07 K/uL Final   Performed at [facility], [address] [place], [place] [address]    Assessment:  Susan [name] is a 84 y.o. female with history of stage IIIc uterus cancer presents for follow-up. 1. Malignant neoplasm of uterus, unspecified site ([place])   2. Pulmonary nodules   3. Chemotherapy-induced peripheral neuropathy ([facility])    #Stage IIIc uterine cancer, status post surgery and 2 cycles of adjuvant chemotherapy and adjuvant radiation. She is clinically doing well.  She has upcoming appointment with radiation oncology Dr. Baruch Gouty as well.  No new symptoms. CA125 is pending.  Labs are reviewed and discussed with patient. CT abdomen pelvis will be indicated if she develops or if any pelvic examination abnormalities.  #Right lung nodules, stable appearance of small pulmonary nodules largest 6 mm.  Patient is low risk of developing lung cancer.  She quit smoking 42 for 50 years ago.  Was never heavy  smoker.  #Bradycardia, sick sinus syndrome.  Asymptomatic.  Continue follow-up with cardiology. #Chemotherapy-induced neuropathy, recommend patient to resume gabapentin. We spent sufficient time to discuss many aspect of care, questions were answered to patient's satisfaction. Follow-up in 1 year.  She should also continue follow-up with gynecology oncology and radiation oncology.  Earlie Server, MD  12/16/2018, 12:42 PM

## 2018-12-17 LAB — CA 125: Cancer Antigen (CA) 125: 15.7 U/mL (ref 0.0–38.1)

## 2018-12-18 ENCOUNTER — Encounter: Payer: Self-pay | Admitting: Radiation Oncology

## 2018-12-18 ENCOUNTER — Ambulatory Visit
Admission: RE | Admit: 2018-12-18 | Discharge: 2018-12-18 | Disposition: A | Discharge status: Home / Self Care | Payer: Medicare Other | Source: Ambulatory Visit | Attending: Radiation Oncology | Admitting: Radiation Oncology

## 2018-12-18 ENCOUNTER — Other Ambulatory Visit: Payer: Self-pay

## 2018-12-18 VITALS — BP 111/72 | HR 65 | Temp 98.1°F | Resp 16 | Wt 199.1 lb

## 2018-12-18 DIAGNOSIS — Z923 Personal history of irradiation: Secondary | ICD-10-CM | POA: Diagnosis not present

## 2018-12-18 DIAGNOSIS — G629 Polyneuropathy, unspecified: Secondary | ICD-10-CM | POA: Insufficient documentation

## 2018-12-18 DIAGNOSIS — C541 Malignant neoplasm of endometrium: Secondary | ICD-10-CM

## 2018-12-18 DIAGNOSIS — R918 Other nonspecific abnormal finding of lung field: Secondary | ICD-10-CM | POA: Diagnosis not present

## 2018-12-18 DIAGNOSIS — R32 Unspecified urinary incontinence: Secondary | ICD-10-CM | POA: Insufficient documentation

## 2018-12-18 DIAGNOSIS — Z8542 Personal history of malignant neoplasm of other parts of uterus: Secondary | ICD-10-CM | POA: Diagnosis not present

## 2018-12-18 NOTE — Progress Notes (Signed)
Radiation Oncology Follow up Note  Name: Susan [name]   Date:   12/18/2018 MRN:  [id number] DOB: 1934/10/12    This 84 y.o. female presents to the clinic today for 3-year follow-up status post radiation therapy for stage IIIc uterine carcinoma.[name]  REFERRING PROVIDER: [name], MD  HPI: Patient is an 84 year old female now at 3 years having completed both external beam radiation therapy as well as brachytherapy for stage IIIc uterine carcinoma.  Seen today in routine follow-up she is doing fairly well.  She specifically denies any increased lower urinary tract symptoms diarrhea or vaginal discharge.  She does have urinary incontinence and some peripheral neuropathy in her lower extremities..  She had mammograms back in February which I have reviewed were BI-RADS 1 negative.  We are also following some pulmonary nodules which seem to be benign since they have not changed over time.  Patient Ca1 25 has been stable  COMPLICATIONS OF TREATMENT: none  FOLLOW UP COMPLIANCE: keeps appointments   PHYSICAL EXAM:  BP 111/72 (BP Location: Left Arm, Patient Position: Sitting)   Pulse 65   Temp 98.1 F (36.7 C) (Tympanic)   Resp 16   Wt 199 lb 1.6 oz (90.3 kg)   BMI 34.18 kg/m  On pelvic examination vaginal vault is clear with some scarring in the vaginal apex.  Bimanual examination shows no evidence of mass or nodularity in the parametrial area or rectal region.  Well-developed well-nourished patient in NAD. HEENT reveals PERLA, EOMI, discs not visualized.  Oral cavity is clear. No oral mucosal lesions are identified. Neck is clear without evidence of cervical or supraclavicular adenopathy. Lungs are clear to A&P. Cardiac examination is essentially unremarkable with regular rate and rhythm without murmur rub or thrill. Abdomen is benign with no organomegaly or masses noted. Motor sensory and DTR levels are equal and symmetric in the upper and lower extremities. Cranial nerves II through XII  are grossly intact. Proprioception is intact. No peripheral adenopathy or edema is identified. No motor or sensory levels are noted. Crude visual fields are within normal range.  RADIOLOGY RESULTS: Mammograms and prior CT scans of the chest are reviewed  PLAN: Present time patient continues to do well with no evidence of disease now out 3 years from both surgery and adjuvant radiation therapy for endometrial carcinoma stage IIIc.  I am pleased with her overall progress.  I have asked to see her back in 1 year for follow-up and then will discontinue follow-up care.  Patient knows to call at anytime with any concerns.  I would like to take this opportunity to thank you for allowing me to participate in the care of your patient.Noreene Filbert, MD

## 2018-12-25 ENCOUNTER — Other Ambulatory Visit: Payer: Self-pay | Admitting: Cardiovascular Disease

## 2018-12-30 ENCOUNTER — Ambulatory Visit: Payer: Medicare Other | Admitting: Cardiology

## 2019-01-04 ENCOUNTER — Other Ambulatory Visit: Payer: Self-pay | Admitting: Cardiovascular Disease

## 2019-01-06 ENCOUNTER — Telehealth: Payer: Self-pay | Admitting: Cardiovascular Disease

## 2019-01-06 NOTE — Telephone Encounter (Signed)
This is Dr. Evette Georges pt. Dr. Claiborne Billings prescribed these medications. Thank you

## 2019-01-06 NOTE — Telephone Encounter (Signed)
°*  STAT* If patient is at the pharmacy, call can be transferred to refill team.   1. Which medications need to be refilled? (please list name of each medication and dose if known)  isosorbide mononitrate (IMDUR) 30 MG 24 hr tablet atorvastatin (LIPITOR) 80 MG tablet  2. Which pharmacy/location (including street and city if local pharmacy) is medication to be sent to? [id number] [address], [place]  3. Do they need a 30 day or 90 day supply? 90  Patient is out of medication

## 2019-01-07 MED ORDER — ATORVASTATIN CALCIUM 80 MG PO TABS: 80.0000 mg | ORAL_TABLET | Freq: Every day | ORAL | 0 refills | Status: DC

## 2019-01-07 MED ORDER — ISOSORBIDE MONONITRATE ER 30 MG PO TB24: 30.0000 mg | ORAL_TABLET | Freq: Every day | ORAL | 0 refills | Status: DC

## 2019-01-07 NOTE — Telephone Encounter (Signed)
Rx(s) sent to pharmacy electronically.  

## 2019-01-15 ENCOUNTER — Other Ambulatory Visit: Payer: Self-pay | Admitting: Cardiology

## 2019-01-15 ENCOUNTER — Other Ambulatory Visit: Payer: Self-pay | Admitting: Cardiovascular Disease

## 2019-01-18 ENCOUNTER — Other Ambulatory Visit: Payer: Self-pay | Admitting: Cardiovascular Disease

## 2019-01-22 ENCOUNTER — Encounter: Payer: Self-pay | Admitting: Student in an Organized Health Care Education/Training Program

## 2019-01-22 ENCOUNTER — Ambulatory Visit
Payer: Medicare Other | Attending: Student in an Organized Health Care Education/Training Program | Admitting: Student in an Organized Health Care Education/Training Program

## 2019-01-22 ENCOUNTER — Other Ambulatory Visit: Payer: Self-pay

## 2019-01-22 VITALS — BP 123/74 | HR 61 | Temp 98.8°F | Resp 18 | Ht 62.5 in | Wt 210.0 lb

## 2019-01-22 DIAGNOSIS — C801 Malignant (primary) neoplasm, unspecified: Secondary | ICD-10-CM | POA: Insufficient documentation

## 2019-01-22 DIAGNOSIS — M47816 Spondylosis without myelopathy or radiculopathy, lumbar region: Secondary | ICD-10-CM | POA: Insufficient documentation

## 2019-01-22 DIAGNOSIS — M8448XA Pathological fracture, other site, initial encounter for fracture: Secondary | ICD-10-CM | POA: Insufficient documentation

## 2019-01-22 DIAGNOSIS — M1711 Unilateral primary osteoarthritis, right knee: Secondary | ICD-10-CM

## 2019-01-22 DIAGNOSIS — G63 Polyneuropathy in diseases classified elsewhere: Secondary | ICD-10-CM | POA: Insufficient documentation

## 2019-01-22 NOTE — Progress Notes (Signed)
Patient's Name: NANI INGRAM  MRN: [id number]  Referring Provider: [name], MD  DOB: [birth year]  PCP: [name], MD  DOS: 01/22/2019  Note by: Gillis Santa, MD  Service setting: Ambulatory outpatient  Attending: Gillis Santa, MD  Location: [facility] (AMB) Pain Management Facility  Specialty: Interventional Pain Management  Patient type: Established   Primary Reason(s) for Visit: Evaluation of chronic illnesses with exacerbation, or progression (Level of risk: moderate) CC: Back Pain (low) and Knee Pain (right)  HPI  Ms. Majkowski is a 84 y.o. year old, female patient, who comes today for a follow-up evaluation. She has Old anterior wall myocardial infarction; Hypertensive heart disease without CHF; Ischemic cardiomyopathy; Coronary artery disease; Dyslipidemia; NSVT (nonsustained ventricular tachycardia) ([place]); Sick sinus syndrome ([place]); Kidney stones; CA skin, basal cell; Gastro-esophageal reflux disease without esophagitis; Lumbar canal stenosis; Osteopenia; Arthritis, degenerative; Carcinoma of endometrium ([place]); Neuralgia neuritis, sciatic nerve; Absence of bladder continence; Neuropathy associated with cancer ([place]); Sacral insufficiency fracture; Pulmonary nodules; Obesity (BMI 30-39.9); Bradycardia; Port-A-Cath in place; Persistent atrial fibrillation ([place]); Chemotherapy-induced peripheral neuropathy ([place]); Chemotherapy-induced neuropathy ([place]); Compression fracture of L5 vertebra with delayed healing; Hyperlipidemia; Neuropathy; and Sacral insufficiency fracture with delayed healing on their problem list. Ms. Woo was last seen on Visit date not found. Her primarily concern today is the Back Pain (low) and Knee Pain (right)  Pain Assessment: Location: Lower Back Radiating: right knee Onset: More than a month ago Duration: Chronic pain Quality: Aching, Cramping, Constant Severity: 5 /10 (subjective, self-reported pain score)  Note: Reported level is compatible with  observation.                         When using our objective Pain Scale, levels between 6 and 10/10 are said to belong in an emergency room, as it progressively worsens from a 6/10, described as severely limiting, requiring emergency care not usually available at an outpatient pain management facility. At a 6/10 level, communication becomes difficult and requires great effort. Assistance to reach the emergency department may be required. Facial flushing and profuse sweating along with potentially dangerous increases in heart rate and blood pressure will be evident. Effect on ADL:   Timing: Constant Modifying factors: tylenol, rest, ice BP: 123/74  HR: 61  Further details on both, my assessment(s), as well as the proposed treatment plan, please see below.  Patient is a very pleasant 84 year old female with a history of low back pain related to lumbar spondylosis and facet arthropathy.  Patient is status post left L3, L4, L5 RFA on 01/16/2018 and was supposed to have her right side done but has been delayed as a result of COVID-19.  She states that her left side is still doing very well in regards to her pain.  She is continuing to have right-sided pain and is interested in pursuing the right-sided L3, L4, L5 radiofrequency ablation that we had planned previously.  She is also endorsing right knee pain.  We discussed considering a right knee intra-articular Hyalgan injection and/or possible genicular nerve block followed by radiofrequency ablation.  We will address her low back pain first by completing the right L3, L4, L5 RFA and then address her knee pain.  Laboratory Chemistry Profile   Screening Lab Results  Component Value Date   MRSAPCR NEGATIVE 10/19/2012    Inflammation (CRP: Acute Phase) (ESR: Chronic Phase) No results found for: CRP, ESRSEDRATE, LATICACIDVEN  Rheumatology No results found for: RF, ANA, LABURIC, URICUR, LYMEIGGIGMAB, LYMEABIGMQN, HLAB27                       Renal Lab Results  Component Value Date   BUN 19 12/16/2018   CREATININE 0.96 12/16/2018   GFRAA >60 12/16/2018   GFRNONAA 55 (L) 12/16/2018                             Hepatic Lab Results  Component Value Date   AST 25 12/16/2018   ALT 18 12/16/2018   ALBUMIN 4.0 12/16/2018   ALKPHOS 91 12/16/2018                        Electrolytes Lab Results  Component Value Date   NA 140 12/16/2018   K 3.8 12/16/2018   CL 105 12/16/2018   CALCIUM 9.5 12/16/2018   MG 2.2 07/14/2015                        Neuropathy Lab Results  Component Value Date   VITAMINB12 1,218 (H) 07/21/2015   HGBA1C 5.7 (H) 10/19/2012                         Coagulation Lab Results  Component Value Date   INR 1.0 12/17/2013   LABPROT 13.3 12/17/2013   APTT 91 (H) 10/19/2012   PLT 198 12/16/2018                        Cardiovascular Lab Results  Component Value Date   CKTOTAL 959 (H) 10/19/2012   CKMB 63.8 (H) 10/19/2012   TROPONINI <0.03 01/17/2017   HGB 12.8 12/16/2018   HCT 38.1 12/16/2018                         ID Lab Results  Component Value Date   MRSAPCR NEGATIVE 10/19/2012    Cancer Lab Results  Component Value Date   CA125 13.6 08/15/2016                        Endocrine Lab Results  Component Value Date   TSH 0.97 06/19/2016   FREET4 1.01 10/19/2012                        Note: Lab results reviewed.  Imaging Review   Results for orders placed during the hospital encounter of 12/12/17  MR LUMBAR SPINE W WO CONTRAST   Narrative CLINICAL DATA:  Low back pain for 6 weeks.  Endometrial cancer.  EXAM: MRI LUMBAR SPINE WITHOUT AND WITH CONTRAST  TECHNIQUE: Multiplanar and multiecho pulse sequences of the lumbar spine were obtained without and with intravenous contrast.  CONTRAST:  68m MULTIHANCE GADOBENATE DIMEGLUMINE 529 MG/ML IV SOLN  COMPARISON:  CT abdomen pelvis 12/12/2017, also 08/31/2016.  FINDINGS: Segmentation:   Standard.  Alignment: Degenerative scoliosis convex LEFT mid lumbar region. Asymmetric loss of interspace height at L2-3 on the RIGHT is contributory  Vertebrae: No worrisome osseous lesion. Advanced degenerative change at L1-2 Modic type 1 changes no evidence metastatic disease. Post XRT changes. Mild enhancement superior S2, as well as STIR and enhancement BILATERAL sacral ala, correlates with sclerosis on CT, likely healed or healing insufficiency fracture.  Conus  medullaris and cauda equina: Conus extends to the L1 level. Conus and cauda equina appear normal.  Paraspinal and other soft tissues: Reported separately on CT. LEFT renal calculi. No hydronephrosis.  Disc levels:  L1-L2: Advanced degenerative change. Disc space narrowing, vacuum phenomenon, and bony overgrowth. Endplate enhancement reflects active degeneration. Superimposed disc extrusion centrally results in stenosis. RIGHT greater than LEFT L2 neural impingement.  L2-L3: Functional fusion across the RIGHT side of the interspace. Near complete loss of interspace height. Shallow protrusion/extrusion in the midline caudal migration does not clearly the exiting nerve roots. RIGHT-sided foraminal narrowing could irritate the L2 nerve root.  L3-L4: Disc space narrowing. Posterior element hypertrophy. Central protrusion. Mild stenosis. RIGHT L3 and BILATERAL L4 neural impingement are possible.  L4-L5: 5 mm slip. Disc space narrowing. Central disc extrusion. Posterior element hypertrophy. Severe stenosis. Cephalad migrated free fragment. BILATERAL L5, and RIGHT L4 neural impingement are likely.  L5-S1: Disc space narrowing. Central and leftward protrusion extends to the foramen. Posterior element hypertrophy. LEFT L5 and LEFT S1 neural impingement.  IMPRESSION: The major finding is severe stenosis at L4-5 related to 5 mm slip, central disc extrusion with cephalad migrated free fragment. Posterior element  hypertrophy is superimposed. BILATERAL L5 neural impingement along with RIGHT L4 neural foraminal narrowing is noted.  Similar less severe changes at L1-2, L2-3, L3-4, and L5-S1 as described above.  No evidence of metastatic disease. Endplate enhancement at L1-2 is degenerative. Mild enhancement of the sacrum correlates with sclerosis on CT, and is consistent with healing/healed insufficiency fractures.   Electronically Signed   By: Staci Righter M.D.   On: 12/13/2017 08:27    Complexity Note: Imaging results reviewed. Results shared with [name], using Layman's terms.                         Meds   Current Outpatient Medications:  .  acetaminophen (TYLENOL) 650 MG CR tablet, Take 650 mg by mouth every 8 (eight) hours as needed for pain., Disp: , Rfl:  .  aspirin EC 81 MG tablet, Take 81 mg by mouth daily., Disp: , Rfl:  .  atorvastatin (LIPITOR) 80 MG tablet, Take 1 tablet (80 mg total) by mouth daily at 6 PM. NEEDS APPOINTMENT WITH DR Claiborne Billings FOR FUTURE REFILLS, Disp: 15 tablet, Rfl: 0 .  ELIQUIS 5 MG TABS tablet, TAKE 1 TABLET BY MOUTH TWICE A DAY, Disp: 180 tablet, Rfl: 1 .  ezetimibe (ZETIA) 10 MG tablet, TAKE 1 TABLET BY MOUTH EVERY DAY, Disp: 90 tablet, Rfl: 1 .  fluticasone (FLONASE) 50 MCG/ACT nasal spray, SPRAY 2 SPRAYS INTO EACH NOSTRIL EVERY DAY, Disp: , Rfl:  .  furosemide (LASIX) 20 MG tablet, Take by mouth., Disp: , Rfl:  .  isosorbide mononitrate (IMDUR) 30 MG 24 hr tablet, TAKE 1 TABLET (30 MG TOTAL) BY MOUTH DAILY., Disp: 30 tablet, Rfl: 2 .  loperamide (IMODIUM A-D) 2 MG tablet, Take 2 mg by mouth 4 (four) times daily as needed for diarrhea or loose stools., Disp: , Rfl:  .  losartan (COZAAR) 25 MG tablet, TAKE 2 TABLETS BY MOUTH EVERY DAY, Disp: 180 tablet, Rfl: 1 .  Multiple Vitamin (MULTIVITAMIN WITH MINERALS) TABS, Take 1 tablet by mouth daily., Disp: , Rfl:  .  NITROSTAT 0.4 MG SL tablet, PLACE 1 TABLET UNDER THE TOUGUE EVERY 5 MINUTES FOR UP TO 3 DOSES AS  NEEDED FOR CHEST PAIN, Disp: 25 tablet, Rfl: 0 .  omeprazole (PRILOSEC) 20  MG capsule, Take 20 mg by mouth daily., Disp: , Rfl:  .  oxybutynin (DITROPAN-XL) 10 MG 24 hr tablet, Take 1 tablet (10 mg total) by mouth daily., Disp: 90 tablet, Rfl: 3 .  vitamin B-12 (CYANOCOBALAMIN) 1000 MCG tablet, Take 1,000 mcg by mouth daily., Disp: , Rfl:  No current facility-administered medications for this visit.   Facility-Administered Medications Ordered in Other Visits:  .  sodium chloride flush (NS) 0.9 % injection 10 mL, 10 mL, Intravenous, PRN, Charlaine Dalton R, MD, 10 mL at 06/06/16 1447 .  sodium chloride flush (NS) 0.9 % injection 10 mL, 10 mL, Intravenous, PRN, Cammie Sickle, MD, 10 mL at 08/15/16 1404  ROS  Constitutional: Denies any fever or chills Gastrointestinal: No reported hemesis, hematochezia, vomiting, or acute GI distress Musculoskeletal: Denies any acute onset joint swelling, redness, loss of ROM, or weakness Neurological: No reported episodes of acute onset apraxia, aphasia, dysarthria, agnosia, amnesia, paralysis, loss of coordination, or loss of consciousness  Allergies  Ms. Islam is allergic to ace inhibitors and penicillins.  [place]  Drug: Ms. Barkey  reports no history of drug use. Alcohol:  reports current alcohol use of about 1.0 standard drinks of alcohol per week. Tobacco:  reports that she quit smoking about 44 years ago. She has never used smokeless tobacco. Medical:  has a past medical history of Bradycardia, Chronic back pain, Coronary artery disease, Echoencephalogram abnormality, Heart murmur, History of Holter monitoring, Hyperlipidemia, Hypertension, Myocardial infarction [facility]), Neuromuscular disorder ([place]), NSVT (nonsustained ventricular tachycardia) ([place]), Personal history of chemotherapy, Personal history of radiation therapy, and Uterine cancer ([place]). Surgical: Ms. Karim  has a past surgical history that includes left heart catheterization with  coronary angiogram (10/19/2012); percutaneous coronary stent intervention (pci-s) (10/19/2012); percutaneous coronary stent intervention (pci-s) (N/A, 10/23/2012); Abdominal hysterectomy; Cardiac catheterization (N/A, 05/05/2015); Breast excisional biopsy (Right, 1956); PORTA CATH REMOVAL (N/A, 04/05/2017); and Cardioversion (N/A, 07/27/2017). Family: family history includes Breast cancer (age of onset: 41) in her mother; Diabetes in her brother and father; Heart disease in her father and mother; Stroke in her brother.  Constitutional Exam  General appearance: Well nourished, well developed, and well hydrated. In no apparent acute distress Vitals:   01/22/19 1101  BP: 123/74  Pulse: 61  Resp: 18  Temp: 98.8 F (37.1 C)  TempSrc: Oral  SpO2: 98%  Weight: 210 lb (95.3 kg)  Height: 5' 2.5" (1.588 m)   BMI Assessment: Estimated body mass index is 37.8 kg/m as calculated from the following:   Height as of this encounter: 5' 2.5" (1.588 m).   Weight as of this encounter: 210 lb (95.3 kg).  BMI interpretation table: BMI level Category Range association with higher incidence of chronic pain  <18 kg/m2 Underweight   18.5-24.9 kg/m2 Ideal body weight   25-29.9 kg/m2 Overweight Increased incidence by 20%  30-34.9 kg/m2 Obese (Class I) Increased incidence by 68%  35-39.9 kg/m2 Severe obesity (Class II) Increased incidence by 136%  >40 kg/m2 Extreme obesity (Class III) Increased incidence by 254%   Patient's current BMI Ideal Body weight  Body mass index is 37.8 kg/m. Ideal body weight: 51.3 kg (112 lb 15.8 oz) Adjusted ideal body weight: 68.9 kg (151 lb 12.7 oz)   BMI Readings from Last 4 Encounters:  01/22/19 37.80 kg/m  12/18/18 34.18 kg/m  12/16/18 35.63 kg/m  09/16/18 35.46 kg/m   Wt Readings from Last 4 Encounters:  01/22/19 210 lb (95.3 kg)  12/18/18 199 lb 1.6 oz (90.3 kg)  12/16/18  207 lb 9.6 oz (94.2 kg)  09/16/18 206 lb 9.6 oz (93.7 kg)  Psych/Mental status: Alert, oriented x  3 (person, place, & time)       Eyes: PERLA Respiratory: No evidence of acute respiratory distress  Cervical Spine Area Exam  Skin & Axial Inspection: No masses, redness, edema, swelling, or associated skin lesions Alignment: Symmetrical Functional ROM: Unrestricted ROM      Stability: No instability detected Muscle Tone/Strength: Functionally intact. No obvious neuro-muscular anomalies detected. Sensory (Neurological): Unimpaired Palpation: No palpable anomalies              Upper Extremity (UE) Exam    Side: Right upper extremity  Side: Left upper extremity  Skin & Extremity Inspection: Skin color, temperature, and hair growth are WNL. No peripheral edema or cyanosis. No masses, redness, swelling, asymmetry, or associated skin lesions. No contractures.  Skin & Extremity Inspection: Skin color, temperature, and hair growth are WNL. No peripheral edema or cyanosis. No masses, redness, swelling, asymmetry, or associated skin lesions. No contractures.  Functional ROM: Unrestricted ROM          Functional ROM: Unrestricted ROM          Muscle Tone/Strength: Functionally intact. No obvious neuro-muscular anomalies detected.  Muscle Tone/Strength: Functionally intact. No obvious neuro-muscular anomalies detected.  Sensory (Neurological): Unimpaired          Sensory (Neurological): Unimpaired          Palpation: No palpable anomalies              Palpation: No palpable anomalies              Provocative Test(s):  Phalen's test: deferred Tinel's test: deferred Apley's scratch test (touch opposite shoulder):  Action 1 (Across chest): deferred Action 2 (Overhead): deferred Action 3 (LB reach): deferred   Provocative Test(s):  Phalen's test: deferred Tinel's test: deferred Apley's scratch test (touch opposite shoulder):  Action 1 (Across chest): deferred Action 2 (Overhead): deferred Action 3 (LB reach): deferred    Thoracic Spine Area Exam  Skin & Axial Inspection: No masses, redness, or  swelling Alignment: Symmetrical Functional ROM: Unrestricted ROM Stability: No instability detected Muscle Tone/Strength: Functionally intact. No obvious neuro-muscular anomalies detected. Sensory (Neurological): Unimpaired Muscle strength & Tone: No palpable anomalies  Lumbar Spine Area Exam  Skin & Axial Inspection: No masses, redness, or swelling Alignment: Symmetrical Functional ROM: Pain restricted ROM affecting primarily the right Stability: No instability detected Muscle Tone/Strength: Functionally intact. No obvious neuro-muscular anomalies detected. Sensory (Neurological): Musculoskeletal pain pattern Palpation: No palpable anomalies       Provocative Tests: Hyperextension/rotation test: (+) on the right for facet joint pain. Lumbar quadrant test (Kemp's test): (+) on the right for facet joint pain. Lateral bending test: deferred today       Patrick's Maneuver: deferred today                   FABER* test: deferred today                   S-I anterior distraction/compression test: deferred today         S-I lateral compression test: deferred today         S-I Thigh-thrust test: deferred today         S-I Gaenslen's test: deferred today         *(Flexion, ABduction and External Rotation)  Gait & Posture Assessment  Ambulation: Unassisted Gait: Relatively normal for age and body  habitus Posture: Difficulty standing up straight, due to pain   Lower Extremity Exam    Side: Right lower extremity  Side: Left lower extremity  Stability: No instability observed          Stability: No instability observed          Skin & Extremity Inspection: Skin color, temperature, and hair growth are WNL. No peripheral edema or cyanosis. No masses, redness, swelling, asymmetry, or associated skin lesions. No contractures.  Skin & Extremity Inspection: Skin color, temperature, and hair growth are WNL. No peripheral edema or cyanosis. No masses, redness, swelling, asymmetry, or associated skin  lesions. No contractures.  Functional ROM: Pain restricted ROM for hip and knee joints          Functional ROM: Unrestricted ROM                  Muscle Tone/Strength: Functionally intact. No obvious neuro-muscular anomalies detected.  Muscle Tone/Strength: Functionally intact. No obvious neuro-muscular anomalies detected.  Sensory (Neurological): Arthropathic arthralgia        Sensory (Neurological): Unimpaired        DTR: Patellar: 1+: trace Achilles: deferred today Plantar: deferred today  DTR: Patellar: 1+: trace Achilles: deferred today Plantar: deferred today  Palpation: No palpable anomalies  Palpation: No palpable anomalies   Assessment   Status Diagnosis  Controlled Controlled Controlled 1. Lumbar facet arthropathy   2. Spondylosis without myelopathy or radiculopathy, lumbar region   3. Neuropathy associated with cancer ([place])   4. Sacral insufficiency fracture, initial encounter   5. Primary osteoarthritis of right knee        Plan of Care   1.  Patient status post left L3, L4, L5 RFA on 01/16/2018.  Plan for right L3, L4, L5 RFA for right lumbar facet arthropathy and lumbar spondylosis that is contributing to her right-sided axial low back pain 2.  Plan for right knee intra-articular Hyalgan versus right knee genicular nerve block after RFA  Orders:  Orders Placed This Encounter  Procedures  . Radiofrequency,Lumbar    Standing Status:   Future    Standing Expiration Date:   07/22/2020    Scheduling Instructions:     Side(s): RIGHT     Level(s):  L3, L4, L5, Medial Branch Nerve(s)     Sedation: With Sedation     Scheduling Timeframe: As soon as pre-approved    Order Specific Question:   Where will this procedure be performed?    Answer:   [facility]   Planned follow-up:   Return in about 1 week (around 01/29/2019) for Procedure R L3, 4, 5 RFA , with sedation.     status post left L3, L4, L5 RFA on 01/16/2018.  Plan for right L3, L4, L5 RFA    Recent  Visits No visits were found meeting these conditions.  Showing recent visits within past 90 days and meeting all other requirements   Today's Visits Date Type Provider Dept  01/22/19 Office Visit Gillis Santa, MD [facility]  Showing today's visits and meeting all other requirements   Future Appointments No visits were found meeting these conditions.  Showing future appointments within next 90 days and meeting all other requirements   Primary Care Physician: [name], MD Location: [facility] Note by: Gillis Santa, MD Date: 01/22/2019; Time: 3:28 PM  Note: This dictation was prepared with Dragon dictation. Any transcriptional errors that may result from this process are unintentional.

## 2019-01-22 NOTE — Patient Instructions (Signed)
____________________________________________________________________________________________  Preparing for Procedure with Sedation  Procedure appointments are limited to planned procedures: . No Prescription Refills. . No disability issues will be discussed. . No medication changes will be discussed.  Instructions: . Oral Intake: Do not eat or drink anything for at least 8 hours prior to your procedure. . Transportation: Public transportation is not allowed. Bring an adult driver. The driver must be physically present in our waiting room before any procedure can be started. . Physical Assistance: Bring an adult physically capable of assisting you, in the event you need help. This adult should keep you company at home for at least 6 hours after the procedure. . Blood Pressure Medicine: Take your blood pressure medicine with a sip of water the morning of the procedure. . Blood thinners: Notify our staff if you are taking any blood thinners. Depending on which one you take, there will be specific instructions on how and when to stop it. . Diabetics on insulin: Notify the staff so that you can be scheduled 1st case in the morning. If your diabetes requires high dose insulin, take only  of your normal insulin dose the morning of the procedure and notify the staff that you have done so. . Preventing infections: Shower with an antibacterial soap the morning of your procedure. . Build-up your immune system: Take 1000 mg of Vitamin C with every meal (3 times a day) the day prior to your procedure. . Antibiotics: Inform the staff if you have a condition or reason that requires you to take antibiotics before dental procedures. . Pregnancy: If you are pregnant, call and cancel the procedure. . Sickness: If you have a cold, fever, or any active infections, call and cancel the procedure. . Arrival: You must be in the facility at least 30 minutes prior to your scheduled procedure. . Children: Do not bring  children with you. . Dress appropriately: Bring dark clothing that you would not mind if they get stained. . Valuables: Do not bring any jewelry or valuables.  Reasons to call and reschedule or cancel your procedure: (Following these recommendations will minimize the risk of a serious complication.) . Surgeries: Avoid having procedures within 2 weeks of any surgery. (Avoid for 2 weeks before or after any surgery). . Flu Shots: Avoid having procedures within 2 weeks of a flu shots or . (Avoid for 2 weeks before or after immunizations). . Barium: Avoid having a procedure within 7-10 days after having had a radiological study involving the use of radiological contrast. (Myelograms, Barium swallow or enema study). . Heart attacks: Avoid any elective procedures or surgeries for the initial 6 months after a "Myocardial Infarction" (Heart Attack). . Blood thinners: It is imperative that you stop these medications before procedures. Let us know if you if you take any blood thinner.  . Infection: Avoid procedures during or within two weeks of an infection (including chest colds or gastrointestinal problems). Symptoms associated with infections include: Localized redness, fever, chills, night sweats or profuse sweating, burning sensation when voiding, cough, congestion, stuffiness, runny nose, sore throat, diarrhea, nausea, vomiting, cold or Flu symptoms, recent or current infections. It is specially important if the infection is over the area that we intend to treat. . Heart and lung problems: Symptoms that may suggest an active cardiopulmonary problem include: cough, chest pain, breathing difficulties or shortness of breath, dizziness, ankle swelling, uncontrolled high or unusually low blood pressure, and/or palpitations. If you are experiencing any of these symptoms, cancel your procedure and contact   your primary care physician for an evaluation.  Remember:  Regular Business hours are:  Monday to Thursday  8:00 AM to 4:00 PM  Provider's Schedule: Milinda Pointer, MD:  Procedure days: Tuesday and Thursday 7:30 AM to 4:00 PM  Gillis Santa, MD:  Procedure days: Monday and Wednesday 7:30 AM to 4:00 PM ____________________________________________________________________________________________   Radiofrequency Lesioning Radiofrequency lesioning is a procedure that is performed to relieve pain. The procedure is often used for back, neck, or arm pain. Radiofrequency lesioning involves the use of a machine that creates radio waves to make heat. During the procedure, the heat is applied to the nerve that carries the pain signal. The heat damages the nerve and interferes with the pain signal. Pain relief usually starts about 2 weeks after the procedure and lasts for 6 months to 1 year. You will be awake during the procedure. You will need to be able to talk with the health care provider during the procedure. Tell a health care provider about:  Any allergies you have.  All medicines you are taking, including vitamins, herbs, eye drops, creams, and over-the-counter medicines.  Any problems you or family members have had with anesthetic medicines.  Any blood disorders you have.  Any surgeries you have had.  Any medical conditions you have or have had.  Whether you are pregnant or may be pregnant. What are the risks? Generally, this is a safe procedure. However, problems may occur, including:  Pain or soreness at the injection site.  Allergic reaction to medicines given during the procedure.  Bleeding.  Infection at the injection site.  Damage to nerves or blood vessels. What happens before the procedure? Staying hydrated Follow instructions from your health care provider about hydration, which may include:  Up to 2 hours before the procedure - you may continue to drink clear liquids, such as water, clear fruit juice, black coffee, and plain tea. Eating and drinking Follow  instructions from your health care provider about eating and drinking, which may include:  8 hours before the procedure - stop eating heavy meals or foods, such as meat, fried foods, or fatty foods.  6 hours before the procedure - stop eating light meals or foods, such as toast or cereal.  6 hours before the procedure - stop drinking milk or drinks that contain milk.  2 hours before the procedure - stop drinking clear liquids. Medicines Ask your health care provider about:  Changing or stopping your regular medicines. This is especially important if you are taking diabetes medicines or blood thinners.  Taking medicines such as aspirin and ibuprofen. These medicines can thin your blood. Do not take these medicines unless your health care provider tells you to take them.  Taking over-the-counter medicines, vitamins, herbs, and supplements. General instructions  Plan to have someone take you home from the hospital or clinic.  If you will be going home right after the procedure, plan to have someone with you for 24 hours.  Ask your health care provider what steps will be taken to help prevent infection. These may include: ? Removing hair at the procedure site. ? Washing skin with a germ-killing soap. ? Taking antibiotic medicine. What happens during the procedure?   An IV will be inserted into one of your veins.  You will be given one or more of the following: ? A medicine to help you relax (sedative). ? A medicine to numb the area (local anesthetic).  Your health care provider will insert a radiofrequency needle into  the area to be treated. This is done with the help of a type of X-ray (fluoroscopy).  A wire that carries the radio waves (electrode) will be put through the radiofrequency needle.  An electrical pulse will be sent through the electrode to verify the correct nerve that is causing your pain. You will feel a tingling sensation, and you may have muscle twitching.  The  tissue around the needle tip will be heated by an electric current that comes from the radiofrequency machine. This will numb the nerves.  The needle will be removed.  A bandage (dressing) will be put on the insertion area. The procedure may vary among health care providers and hospitals. What happens after the procedure?  Your blood pressure, heart rate, breathing rate, and blood oxygen level will be monitored until you leave the hospital or clinic.  Return to your normal activities as told by your health care provider. Ask your health care provider what activities are safe for you.  Do not drive for 24 hours if you were given a sedative during your procedure. Summary  Radiofrequency lesioning is a procedure that is performed to relieve pain. The procedure is often used for back, neck, or arm pain.  Radiofrequency lesioning involves the use of a machine that creates radio waves to make heat.  Plan to have someone take you home from the hospital or clinic. Do not drive for 24 hours if you were given a sedative during your procedure.  Return to your normal activities as told by your health care provider. Ask your health care provider what activities are safe for you. This information is not intended to replace advice given to you by your health care provider. Make sure you discuss any questions you have with your health care provider. Document Released: 11/09/2010 Document Revised: 11/29/2017 Document Reviewed: 11/29/2017 [employer] Patient Education  2020 [employer].

## 2019-01-22 NOTE — Progress Notes (Signed)
Safety precautions to be maintained throughout the outpatient stay will include: orient to surroundings, keep bed in low position, maintain call bell within reach at all times, provide assistance with transfer out of bed and ambulation.  

## 2019-02-10 ENCOUNTER — Other Ambulatory Visit: Payer: Self-pay | Admitting: Cardiovascular Disease

## 2019-02-10 DIAGNOSIS — I2510 Atherosclerotic heart disease of native coronary artery without angina pectoris: Secondary | ICD-10-CM

## 2019-02-19 ENCOUNTER — Ambulatory Visit: Payer: Medicare Other

## 2019-02-19 ENCOUNTER — Other Ambulatory Visit: Payer: Medicare Other

## 2019-02-26 ENCOUNTER — Encounter: Payer: Self-pay | Admitting: Obstetrics and Gynecology

## 2019-02-26 ENCOUNTER — Inpatient Hospital Stay: Payer: Medicare Other | Attending: Obstetrics and Gynecology

## 2019-02-26 ENCOUNTER — Inpatient Hospital Stay (HOSPITAL_BASED_OUTPATIENT_CLINIC_OR_DEPARTMENT_OTHER): Payer: Medicare Other | Admitting: Obstetrics and Gynecology

## 2019-02-26 ENCOUNTER — Other Ambulatory Visit: Payer: Self-pay

## 2019-02-26 VITALS — BP 80/41 | HR 65 | Temp 96.3°F | Resp 18 | Wt 210.7 lb

## 2019-02-26 DIAGNOSIS — Z8542 Personal history of malignant neoplasm of other parts of uterus: Secondary | ICD-10-CM | POA: Insufficient documentation

## 2019-02-26 DIAGNOSIS — Z9221 Personal history of antineoplastic chemotherapy: Secondary | ICD-10-CM | POA: Diagnosis not present

## 2019-02-26 DIAGNOSIS — E669 Obesity, unspecified: Secondary | ICD-10-CM | POA: Insufficient documentation

## 2019-02-26 DIAGNOSIS — Z923 Personal history of irradiation: Secondary | ICD-10-CM | POA: Insufficient documentation

## 2019-02-26 DIAGNOSIS — G620 Drug-induced polyneuropathy: Secondary | ICD-10-CM | POA: Diagnosis not present

## 2019-02-26 DIAGNOSIS — M48061 Spinal stenosis, lumbar region without neurogenic claudication: Secondary | ICD-10-CM | POA: Insufficient documentation

## 2019-02-26 DIAGNOSIS — C541 Malignant neoplasm of endometrium: Secondary | ICD-10-CM

## 2019-02-26 DIAGNOSIS — M549 Dorsalgia, unspecified: Secondary | ICD-10-CM | POA: Insufficient documentation

## 2019-02-26 DIAGNOSIS — Z90722 Acquired absence of ovaries, bilateral: Secondary | ICD-10-CM | POA: Insufficient documentation

## 2019-02-26 DIAGNOSIS — K219 Gastro-esophageal reflux disease without esophagitis: Secondary | ICD-10-CM | POA: Insufficient documentation

## 2019-02-26 DIAGNOSIS — R32 Unspecified urinary incontinence: Secondary | ICD-10-CM | POA: Diagnosis not present

## 2019-02-26 DIAGNOSIS — I255 Ischemic cardiomyopathy: Secondary | ICD-10-CM | POA: Diagnosis not present

## 2019-02-26 DIAGNOSIS — R001 Bradycardia, unspecified: Secondary | ICD-10-CM | POA: Insufficient documentation

## 2019-02-26 DIAGNOSIS — Z08 Encounter for follow-up examination after completed treatment for malignant neoplasm: Secondary | ICD-10-CM | POA: Diagnosis not present

## 2019-02-26 DIAGNOSIS — M1990 Unspecified osteoarthritis, unspecified site: Secondary | ICD-10-CM | POA: Diagnosis not present

## 2019-02-26 DIAGNOSIS — R918 Other nonspecific abnormal finding of lung field: Secondary | ICD-10-CM | POA: Diagnosis not present

## 2019-02-26 DIAGNOSIS — Z6830 Body mass index (BMI) 30.0-30.9, adult: Secondary | ICD-10-CM | POA: Diagnosis not present

## 2019-02-26 DIAGNOSIS — I4819 Other persistent atrial fibrillation: Secondary | ICD-10-CM | POA: Diagnosis not present

## 2019-02-26 DIAGNOSIS — Z90710 Acquired absence of both cervix and uterus: Secondary | ICD-10-CM | POA: Insufficient documentation

## 2019-02-26 DIAGNOSIS — Z7982 Long term (current) use of aspirin: Secondary | ICD-10-CM | POA: Insufficient documentation

## 2019-02-26 DIAGNOSIS — E785 Hyperlipidemia, unspecified: Secondary | ICD-10-CM | POA: Diagnosis not present

## 2019-02-26 DIAGNOSIS — I495 Sick sinus syndrome: Secondary | ICD-10-CM | POA: Insufficient documentation

## 2019-02-26 DIAGNOSIS — Z87891 Personal history of nicotine dependence: Secondary | ICD-10-CM | POA: Insufficient documentation

## 2019-02-26 DIAGNOSIS — I252 Old myocardial infarction: Secondary | ICD-10-CM | POA: Insufficient documentation

## 2019-02-26 DIAGNOSIS — I472 Ventricular tachycardia: Secondary | ICD-10-CM | POA: Insufficient documentation

## 2019-02-26 DIAGNOSIS — I2510 Atherosclerotic heart disease of native coronary artery without angina pectoris: Secondary | ICD-10-CM | POA: Insufficient documentation

## 2019-02-26 DIAGNOSIS — Z79899 Other long term (current) drug therapy: Secondary | ICD-10-CM | POA: Insufficient documentation

## 2019-02-26 DIAGNOSIS — I119 Hypertensive heart disease without heart failure: Secondary | ICD-10-CM | POA: Diagnosis not present

## 2019-02-26 DIAGNOSIS — M8580 Other specified disorders of bone density and structure, unspecified site: Secondary | ICD-10-CM | POA: Diagnosis not present

## 2019-02-26 DIAGNOSIS — T451X5A Adverse effect of antineoplastic and immunosuppressive drugs, initial encounter: Secondary | ICD-10-CM

## 2019-02-26 LAB — CBC WITH DIFFERENTIAL/PLATELET
Abs Immature Granulocytes: 0.01 10*3/uL (ref 0.00–0.07)
Basophils Absolute: 0 10*3/uL (ref 0.0–0.1)
Basophils Relative: 0 %
Eosinophils Absolute: 0.1 10*3/uL (ref 0.0–0.5)
Eosinophils Relative: 1 %
HCT: 38.7 % (ref 36.0–46.0)
Hemoglobin: 12.6 g/dL (ref 12.0–15.0)
Immature Granulocytes: 0 %
Lymphocytes Relative: 22 %
Lymphs Abs: 1.2 10*3/uL (ref 0.7–4.0)
MCH: 30.5 pg (ref 26.0–34.0)
MCHC: 32.6 g/dL (ref 30.0–36.0)
MCV: 93.7 fL (ref 80.0–100.0)
Monocytes Absolute: 0.6 10*3/uL (ref 0.1–1.0)
Monocytes Relative: 10 %
Neutro Abs: 3.8 10*3/uL (ref 1.7–7.7)
Neutrophils Relative %: 67 %
Platelets: 213 10*3/uL (ref 150–400)
RBC: 4.13 MIL/uL (ref 3.87–5.11)
RDW: 12.8 % (ref 11.5–15.5)
WBC: 5.6 10*3/uL (ref 4.0–10.5)
nRBC: 0 % (ref 0.0–0.2)

## 2019-02-26 LAB — COMPREHENSIVE METABOLIC PANEL
ALT: 14 U/L (ref 0–44)
AST: 25 U/L (ref 15–41)
Albumin: 4 g/dL (ref 3.5–5.0)
Alkaline Phosphatase: 92 U/L (ref 38–126)
Anion gap: 8 (ref 5–15)
BUN: 18 mg/dL (ref 8–23)
CO2: 26 mmol/L (ref 22–32)
Calcium: 9.5 mg/dL (ref 8.9–10.3)
Chloride: 103 mmol/L (ref 98–111)
Creatinine, Ser: 1.06 mg/dL — ABNORMAL HIGH (ref 0.44–1.00)
GFR calc Af Amer: 57 mL/min — ABNORMAL LOW (ref >60)
GFR calc non Af Amer: 49 mL/min — ABNORMAL LOW (ref >60)
Glucose, Bld: 125 mg/dL — ABNORMAL HIGH (ref 70–99)
Potassium: 3.7 mmol/L (ref 3.5–5.1)
Sodium: 137 mmol/L (ref 135–145)
Total Bilirubin: 0.9 mg/dL (ref 0.3–1.2)
Total Protein: 6.9 g/dL (ref 6.5–8.1)

## 2019-02-26 NOTE — Progress Notes (Signed)
Gynecologic Oncology Interval Visit   Referring Provider: Dr Joylene Igo  Chief Concern: stage IIIC1serous endometrial cancer  Subjective:  Susan [name] is a 84 y.o., P3 female, initially seen in consultation from Dr. 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   Today, she reports continued back pain, right knee pain and swelling, and numbness and tingling due to neuropathy from chemotherapy. Started on neurontin and escalating dose. Seeing a pain specialist and had RFA procedure for back pain.  Continues to teach water aerobics courses.  Also has considerable urinary leakage and uses oxybutinin.  This restricts her activities dramatically.    Gynecologic Oncology  Susan [name] is a 84 y.o. 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  Please see complete details in prior notes.   Path report below shows stage IIIC1 disease.  Washings were negative, but she had 3 positive SLNs in pelvis.  An aortic SLN was negative.     A. Lymph node, sentinel, right external iliac, excision: Minute focus of metastatic carcinoma involving one lymph node (1/1). See note. Size of metastasis: Less than 0.1 mm. Negative for extranodal invasion.  Note: A cytokeratin AE1/3 highlights the metastatic focus.  B. Lymph node, sentinel, right external iliac #2, excision: Metastatic carcinoma involving one lymph node (1/1). See note. Size of metastasis: 0.5 mm. Negative for extranodal invasion. Note: A cytokeratin AE1/3 highlights the metastatic focus.  C. Lymph node, sentinel, right aortic, excision: One benign lymph node (0/1). Note: A cytokeratin AE1/3 immunostain  is negative.  D. Lymph node, sentinel, left external iliac, excision:  Metastatic adenocarcinoma involving one lymph node (1/1).   Size of metastasis: 1 cm. Negative for extranodal invasion. Note: A cytokeratin AE1/3 highlights the metastatic focus.  E. 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  Tumor size: 5.1 cm. The tumor invades 2 mm into a 14 mm thick myometrium. Negative for cervical involvement. Lymphovascular invasion is present.  04/28/2015 CA125 82.5   Recommendation to proceed with with carboplatinum and paclitaxel three-four cycles followed by radiation therapy followed by additional 2-3 cycles of chemotherapy. She started on chemotherapy on 05/12/2015 and received 2nd cycle on 06/02/2015. She developed significant neuropathy impairing her quality of life with complaints of sensory ataxia, frequent falls, and needs to walk with the help of walker. Chemotherapy was discontinued. Then received 4500 cGy pelvic radiation and 1200 cGy using high dose rate remote afterloading through vaginal cylinder completed 7/17.  08/31/2016 Abdominal and pelvic CT scan revealed no evidence of recurrent disease.  There was a new sacral insufficiency fracture.  She was referred to surgery.  Scans revealed a 3 mm pulmonary nodule.  Repeat chest CT in 3 months was recommended.  Pelvic MRI on 10/23/2016 revealed bilateral sacral insufficiency fractures with possible insufficiency fractures of the left greater than right bilateral iliac wings.  Lumbar spine MRI revealed diffuse degenerative disease.  Decision was made not to perform a sacral plasty.  She was seen by Dr. Deetta Perla on 11/07/2016.  Surgical decompression or fusion was not recommended. Recommendation was made to the pain clinic for treatment (medication, injections, or spinal cord stimulator). She is  currently seeing a chiropractor and has had 6 treatments.  Chest CT on 12/05/2016 revealed scattered right lung nodules measuring up to 6 mm (mean diameter) in the right upper lobe along the minor fissure.  Metastatic disease was not excluded.  Recommendation was for follow-up CT in 3-6 months.   CA125 values 04/28/2015 82.5  06/02/2015 22.0 07/14/2015 18.2 10/21/2015 11.7 01/21/2016 16.4 04/25/2016 14.2 08/15/2016 13.6 12/07/2016  16.2 03/08/2017 16.1  Seen in clinic on 02/21/2017 by Dr. Theora Gianotti with negative exam.   She has seen Dr. Mike Gip for routine follow-up and had a negative exam. She has had her port removed. She continues to be followed by cardiology for bradycardia. She declined a pacemaker at that time for asymptomatic bradycardia. She developed atrial fibrillation and underwent electrical cardioversion on 07/27/17. She has known lung nodules which showed:   03/06/2017 CT Chest WO Contrast: IMPRESSION: Stable tiny sub-cm right lung nodules, probably benign. Recommend continued followup by chest CT in 6 months.  She had a prior history of urinary incontinence and was seen by Dr. Matilde Sprang, Urology on 08/13/17. They discussed percutaneous tibial nerve stimulation.   CT C/A/P 12/22/17 IMPRESSION: 1. No acute cardio pulmonary abnormalities.  2. Stable appearance of small pulmonary nodules compared with 12/05/2016. The largest nodule measures 6 mm. Future CT at 18-24 months (from 12/05/2016) is considered optional for low-risk patients, but is recommended for high-risk patients. This recommendation follows the consensus statement: Guidelines for Management of Incidental Pulmonary Nodules Detected on CT Images: From the Fleischner Society 2017; Radiology 2017; 284:228-243. 3. Aortic atherosclerosis and 3 vessel coronary artery atherosclerotic calcifications.  Problem List: Patient Active Problem List   Diagnosis Date Noted  . Chemotherapy-induced neuropathy ([place]) 04/17/2018  .  Neuropathy 04/17/2018  . Chemotherapy-induced peripheral neuropathy ([place]) 03/01/2018  . Persistent atrial fibrillation ([place])   . Port-A-Cath in place 03/28/2017  . Bradycardia 01/18/2017  . Obesity (BMI 30-39.9) 01/17/2017  . Pulmonary nodules 12/05/2016  . Compression fracture of L5 vertebra with delayed healing 11/01/2016  . Sacral insufficiency fracture with delayed healing 11/01/2016  . Sacral insufficiency fracture 09/05/2016  . Absence of bladder continence 07/21/2015  . Neuropathy associated with cancer ([place]) 06/02/2015  . Carcinoma of endometrium ([place]) 04/02/2015  . Gastro-esophageal reflux disease without esophagitis 02/15/2015  . Osteopenia 08/17/2014  . Hyperlipidemia 02/13/2014  . Kidney stones 10/27/2013  . CA skin, basal cell 06/30/2013  . Lumbar canal stenosis 06/30/2013  . Arthritis, degenerative 06/30/2013  . Neuralgia neuritis, sciatic nerve 06/30/2013  . Coronary artery disease 10/25/2012  . Dyslipidemia 10/25/2012  . NSVT (nonsustained ventricular tachycardia) ([place]) 10/25/2012  . Sick sinus syndrome ([place]) 10/25/2012  . Ischemic cardiomyopathy 10/22/2012  . Old anterior wall myocardial infarction 10/20/2012  . Hypertensive heart disease without CHF 10/20/2012    Past Medical History: Past Medical History:  Diagnosis Date  . Bradycardia   . Chronic back pain   . Coronary artery disease    a. s/p STEMI in 2014 with DES to LAD with staged PCI/DES placement to LCx  . Echoencephalogram abnormality   . Heart murmur   . History of Holter monitoring   . Hyperlipidemia   . Hypertension   . Myocardial infarction ([place])   . Neuromuscular disorder ([facility])    neuropathy  . NSVT (nonsustained ventricular tachycardia) ([place])   . Personal history of chemotherapy    ENDOMETRIAL CA  . Personal history of radiation therapy    ENDOMETRIAL CA  . Uterine cancer ([place])    serrous  Past Surgical History: Past Surgical History:  Procedure Laterality Date  . ABDOMINAL  HYSTERECTOMY    . BREAST EXCISIONAL BIOPSY Right 1956   NEG  . CARDIOVERSION N/A 07/27/2017   Procedure: CARDIOVERSION;  Surgeon: Skeet Latch, MD;  Location: [place];  Service: Cardiovascular;  Laterality: N/A;  . LEFT HEART CATHETERIZATION WITH CORONARY ANGIOGRAM  10/19/2012   Procedure: LEFT HEART CATHETERIZATION WITH CORONARY ANGIOGRAM;  Surgeon: Troy Sine, MD;  Location: [facility] CATH LAB;  Service: Cardiovascular;;  . PERCUTANEOUS CORONARY STENT INTERVENTION (PCI-S)  10/19/2012   Procedure: PERCUTANEOUS CORONARY STENT INTERVENTION (PCI-S);  Surgeon: Troy Sine, MD;  Location: [facility] CATH LAB;  Service: Cardiovascular;;  . PERCUTANEOUS CORONARY STENT INTERVENTION (PCI-S) N/A 10/23/2012   Procedure: PERCUTANEOUS CORONARY STENT INTERVENTION (PCI-S);  Surgeon: Troy Sine, MD;  Location: [facility] CATH LAB;  Service: Cardiovascular;  Laterality: N/A;  . PERIPHERAL VASCULAR CATHETERIZATION N/A 05/05/2015   Procedure: Glori Luis Cath Insertion;  Surgeon: Algernon Huxley, MD;  Location: [place] CV LAB;  Service: Cardiovascular;  Laterality: N/A;  . PORTA CATH REMOVAL N/A 04/05/2017   Procedure: PORTA CATH REMOVAL;  Surgeon: Algernon Huxley, MD;  Location: [place] CV LAB;  Service: Cardiovascular;  Laterality: N/A;    Family History: Family History  Problem Relation Age of Onset  . Heart disease Mother   . Breast cancer Mother 94  . Heart disease Father   . Diabetes Father   . Diabetes Brother   . Stroke Brother   . Kidney cancer Neg Hx   . Bladder Cancer Neg Hx     Social History: Social History   Socioeconomic History  . Marital status: Married    Spouse name: Not on file  . Number of children: Not on file  . Years of education: Not on file  . Highest education level: Not on file  Occupational History  . Not on file  Social Needs  . Financial resource strain: Not on file  . Food insecurity    Worry: Not on file    Inability: Not on file  . Transportation needs    Medical:  Not on file    Non-medical: Not on file  Tobacco Use  . Smoking status: Former Smoker    Quit date: 1976    Years since quitting: 44.9  . Smokeless tobacco: Never Used  Substance and Sexual Activity  . Alcohol use: Yes    Alcohol/week: 1.0 standard drinks    Types: 1 Glasses of wine per week  . Drug use: No  . Sexual activity: Yes  Lifestyle  . Physical activity    Days per week: Not on file    Minutes per session: Not on file  . Stress: Not on file  Relationships  . Social Herbalist on phone: Not on file    Gets together: Not on file    Attends religious service: Not on file    Active member of club or organization: Not on file    Attends meetings of clubs or organizations: Not on file    Relationship status: Not on file  . Intimate partner violence    Fear of current or ex partner: Not on file    Emotionally abused: Not on file    Physically abused: Not on file    Forced sexual activity: Not on file  Other Topics Concern  . Not on file  Social History Narrative  . Not on file    Allergies: Allergies  Allergen Reactions  . Ace Inhibitors Cough  . Penicillins Rash and Other (See Comments)    Has patient had a PCN reaction causing immediate rash, facial/tongue/throat swelling, SOB or lightheadedness with hypotension: No Has patient had a PCN reaction causing severe rash involving mucus membranes or skin necrosis: No Has patient had a PCN reaction that required hospitalization: No Has patient had a PCN reaction occurring within the last 10 years: No If all of the above answers are "NO", then may proceed with Cephalosporin use.     Current Medications: Current Outpatient Medications  Medication Sig Dispense Refill  . acetaminophen (TYLENOL) 650 MG CR tablet Take 650 mg by mouth every 8 (eight) hours as needed for pain.    [name] aspirin EC 81 MG tablet Take 81 mg by mouth daily.    [name] atorvastatin (LIPITOR) 80 MG tablet Take 1 tablet (80 mg total) by mouth  daily at 6 PM. NEEDS APPOINTMENT WITH DR Claiborne Billings FOR FUTURE REFILLS 15 tablet 0  . ELIQUIS 5 MG TABS tablet TAKE 1 TABLET BY MOUTH TWICE A DAY 180 tablet 1  . ezetimibe (ZETIA) 10 MG tablet TAKE 1 TABLET BY MOUTH EVERY DAY 90 tablet 1  . fluticasone (FLONASE) 50 MCG/ACT nasal spray SPRAY 2 SPRAYS INTO EACH NOSTRIL EVERY DAY    . furosemide (LASIX) 20 MG tablet Take by mouth.    . isosorbide mononitrate (IMDUR) 30 MG 24 hr tablet TAKE 1 TABLET (30 MG TOTAL) BY MOUTH DAILY. 30 tablet 2  . loperamide (IMODIUM A-D) 2 MG tablet Take 2 mg by mouth 4 (four) times daily as needed for diarrhea or loose stools.    [name] losartan (COZAAR) 25 MG tablet TAKE 2 TABLETS BY MOUTH EVERY DAY 180 tablet 1  . Multiple Vitamin (MULTIVITAMIN WITH MINERALS) TABS Take 1 tablet by mouth daily.    [name] NITROSTAT 0.4 MG SL tablet PLACE 1 TABLET UNDER THE TOUGUE EVERY 5 MINUTES FOR UP TO 3 DOSES AS NEEDED FOR CHEST PAIN 25 tablet 0  . omeprazole (PRILOSEC) 20 MG capsule Take 20 mg by mouth daily.    [name] oxybutynin (DITROPAN-XL) 10 MG 24 hr tablet Take 1 tablet (10 mg total) by mouth daily. 90 tablet 3  . vitamin B-12 (CYANOCOBALAMIN) 1000 MCG tablet Take 1,000 mcg by mouth daily.     No current facility-administered medications for this visit.    Facility-Administered Medications Ordered in Other Visits  Medication Dose Route Frequency Provider Last Rate Last Dose  . sodium chloride flush (NS) 0.9 % injection 10 mL  10 mL Intravenous PRN Cammie Sickle, MD   10 mL at 06/06/16 1447  . sodium chloride flush (NS) 0.9 % injection 10 mL  10 mL Intravenous PRN Cammie Sickle, MD   10 mL at 08/15/16 1404   Review of Systems General:  no complaints Skin: no complaints Eyes: no complaints HEENT: no complaints Breasts: no complaints Pulmonary: no complaints Cardiac: no complaints Gastrointestinal: no complaints Genitourinary/Sexual: no complaints Ob/Gyn: no complaints Musculoskeletal: no complaints Hematology: no  complaints Neurologic/Psych: no complaints  Objective:  Physical Examination:  There were no vitals taken for this visit.   ECOG Performance Status: 1 - Symptomatic but completely ambulatory  GENERAL: Patient is a well appearing female in no acute distress HEENT:  Sclera clear. Anicteric NODES:  Negative axillary, supraclavicular, inguinal lymph node survery LUNGS:  Clear to auscultation bilaterally.   HEART:  Regular rate and rhythm.  ABDOMEN:  Soft, nontender.  No hernias, incisions  well healed. No masses or ascites EXTREMITIES:  No peripheral edema. Atraumatic. No cyanosis SKIN:  Clear with no obvious rashes or skin changes.  NEURO:  Nonfocal. Well oriented.  Appropriate affect.   GENERAL: Patient is a well appearing female in no acute distress HEENT:  Sclerae anicteric.  Oropharynx clear and moist. No ulcerations or evidence of oropharyngeal candidiasis. Neck is supple.  NODES:  No cervical, supraclavicular, or axillary lymphadenopathy palpated.  LUNGS:  Clear to auscultation bilaterally.  No wheezes or rhonchi. HEART:  Regular rate and rhythm. No murmur appreciated. ABDOMEN:  Soft, nontender.  Positive, normoactive bowel sounds. No organomegaly palpated. MSK:  No focal spinal tenderness to palpation. Full range of motion bilaterally in the upper extremities. Ambulates with Cane EXTREMITIES:  No peripheral edema.   SKIN:  Clear with no obvious rashes or skin changes. No nail dyscrasia. NEURO:  Nonfocal. Well oriented.  Appropriate affect. BREAST: Breasts appear normal, no suspicious masses, no skin or nipple changes or axillary nodes  Pelvic: Exam Chaperoned by RN. Vulva: normal appearing vulva with no masses, tenderness or lesions; She is leaking urine with movement down the table. Vagina: atrophic; no lesions, vaginal shortening; BME: no masses; Uterus/Cervix:surgically absent; Rectal: confirmatory  Assessment:  Susan [name] is a 84 y.o. female diagnosed 1/17 with stage  IIIC1 mixed grade 3 serous/endometrioid uterine cancer with three positive pelvic sentinel nodes and negative right aortic SLN, s/p 2 cycles of adjuvant carboplatin/paclitaxel chemotherapy (discontinued due to neuropathy interfering with ambulation), external pelvic radiation and vaginal brachytherapy. NED today on exam.  CT scan 9/19 normal and no change in indeterminate lung lesions.    Body mass index is 37.92 kg/m.  Urinary incontinence that is better with oxybutin, but still severe and limiting activities. Probably related to radiation, as she had both external and brachytherapy.   Peripheral neuropathy due to taxane chemotherapy.  Follow up with neurology for continued management with neurontin.   Back pain: will continue with her current treating providers.   Plan:  She will continue follow up with Dr. Baruch Gouty at the [facility] center.    We will have her see [facility] for consult.  She will continue to follow up with Urology, Cardiology, and her other care providers regarding her medical co morbidities.    RTC in 6 months to see [place].  Mellody Drown, MD

## 2019-02-26 NOTE — Progress Notes (Signed)
Pt in for follow up, denies any difficulties or concerns.  

## 2019-02-26 NOTE — Patient Instructions (Signed)
Urogynecology at [facility]- [phone]

## 2019-02-27 ENCOUNTER — Ambulatory Visit: Payer: Medicare Other | Admitting: Cardiology

## 2019-02-27 LAB — CA 125: Cancer Antigen (CA) 125: 14.4 U/mL (ref 0.0–38.1)

## 2019-02-28 ENCOUNTER — Other Ambulatory Visit: Payer: Self-pay | Admitting: Nurse Practitioner

## 2019-02-28 ENCOUNTER — Telehealth: Payer: Self-pay | Admitting: Cardiovascular Disease

## 2019-02-28 DIAGNOSIS — C541 Malignant neoplasm of endometrium: Secondary | ICD-10-CM

## 2019-02-28 NOTE — Telephone Encounter (Signed)
New Message    Pt is calling because she contacted [facility] for her medical equipment for her sleep apnea and she says they need a request from Dr Susan [name] to receive the medical equipment   Please call

## 2019-03-04 NOTE — Telephone Encounter (Signed)
CMN order given to Dr Claiborne Billings for signature.

## 2019-03-12 ENCOUNTER — Ambulatory Visit: Payer: Medicare Other | Admitting: Student in an Organized Health Care Education/Training Program

## 2019-04-14 ENCOUNTER — Other Ambulatory Visit: Payer: Self-pay

## 2019-04-14 ENCOUNTER — Encounter: Payer: Self-pay | Admitting: Student in an Organized Health Care Education/Training Program

## 2019-04-14 ENCOUNTER — Ambulatory Visit (HOSPITAL_BASED_OUTPATIENT_CLINIC_OR_DEPARTMENT_OTHER): Payer: Medicare PPO | Admitting: Student in an Organized Health Care Education/Training Program

## 2019-04-14 ENCOUNTER — Telehealth: Payer: Self-pay | Admitting: Cardiology

## 2019-04-14 ENCOUNTER — Ambulatory Visit
Admission: RE | Admit: 2019-04-14 | Discharge: 2019-04-14 | Disposition: A | Discharge status: Home / Self Care | Payer: Medicare PPO | Source: Ambulatory Visit | Attending: Student in an Organized Health Care Education/Training Program | Admitting: Student in an Organized Health Care Education/Training Program

## 2019-04-14 VITALS — BP 123/61 | HR 72 | Temp 97.1°F | Resp 13 | Ht 62.0 in | Wt 208.0 lb

## 2019-04-14 DIAGNOSIS — M47816 Spondylosis without myelopathy or radiculopathy, lumbar region: Secondary | ICD-10-CM | POA: Diagnosis present

## 2019-04-14 MED ORDER — LIDOCAINE HCL 2 % IJ SOLN
20.0000 mL | Freq: Once | INTRAMUSCULAR | Status: AC
  Administered 2019-04-14: 400 mg
  Filled 2019-04-14: qty 40

## 2019-04-14 MED ORDER — ROPIVACAINE HCL 2 MG/ML IJ SOLN
9.0000 mL | Freq: Once | INTRAMUSCULAR | Status: AC
  Administered 2019-04-14: 9 mL via PERINEURAL
  Filled 2019-04-14: qty 10

## 2019-04-14 MED ORDER — DEXAMETHASONE SODIUM PHOSPHATE 10 MG/ML IJ SOLN
10.0000 mg | Freq: Once | INTRAMUSCULAR | Status: AC
  Administered 2019-04-14: 10 mg
  Filled 2019-04-14: qty 1

## 2019-04-14 NOTE — Patient Instructions (Signed)
____________________________________________________________________________________________  Post-Procedure Discharge Instructions  Instructions:  Apply ice:   Purpose: This will minimize any swelling and discomfort after procedure.   When: Day of procedure, as soon as you get home.  How: Fill a plastic sandwich bag with crushed ice. Cover it with a small towel and apply to injection site.  How long: (15 min on, 15 min off) Apply for 15 minutes then remove x 15 minutes.  Repeat sequence on day of procedure, until you go to bed.  Apply heat:   Purpose: To treat any soreness and discomfort from the procedure.  When: Starting the next day after the procedure.  How: Apply heat to procedure site starting the day following the procedure.  How long: May continue to repeat daily, until discomfort goes away.  Food intake: Start with clear liquids (like water) and advance to regular food, as tolerated.   Physical activities: Keep activities to a minimum for the first 8 hours after the procedure. After that, then as tolerated.  Driving: If you have received any sedation, be responsible and do not drive. You are not allowed to drive for 24 hours after having sedation.  Blood thinner: (Applies only to those taking blood thinners) You may restart your blood thinner 6 hours after your procedure.  Insulin: (Applies only to Diabetic patients taking insulin) As soon as you can eat, you may resume your normal dosing schedule.  Infection prevention: Keep procedure site clean and dry. Shower daily and clean area with soap and water.  Post-procedure Pain Diary: Extremely important that this be done correctly and accurately. Recorded information will be used to determine the next step in treatment. For the purpose of accuracy, follow these rules:  Evaluate only the area treated. Do not report or include pain from an untreated area. For the purpose of this evaluation, ignore all other areas of pain,  except for the treated area.  After your procedure, avoid taking a long nap and attempting to complete the pain diary after you wake up. Instead, set your alarm clock to go off every hour, on the hour, for the initial 8 hours after the procedure. Document the duration of the numbing medicine, and the relief you are getting from it.  Do not go to sleep and attempt to complete it later. It will not be accurate. If you received sedation, it is likely that you were given a medication that may cause amnesia. Because of this, completing the diary at a later time may cause the information to be inaccurate. This information is needed to plan your care.  Follow-up appointment: Keep your post-procedure follow-up evaluation appointment after the procedure (usually 2 weeks for most procedures, 6 weeks for radiofrequencies). DO NOT FORGET to bring you pain diary with you.   Expect: (What should I expect to see with my procedure?)  From numbing medicine (AKA: Local Anesthetics): Numbness or decrease in pain. You may also experience some weakness, which if present, could last for the duration of the local anesthetic.  Onset: Full effect within 15 minutes of injected.  Duration: It will depend on the type of local anesthetic used. On the average, 1 to 8 hours.   From steroids (Applies only if steroids were used): Decrease in swelling or inflammation. Once inflammation is improved, relief of the pain will follow.  Onset of benefits: Depends on the amount of swelling present. The more swelling, the longer it will take for the benefits to be seen. In some cases, up to 10 days.    Duration: Steroids will stay in the system x 2 weeks. Duration of benefits will depend on multiple posibilities including persistent irritating factors.  Side-effects: If present, they may typically last 2 weeks (the duration of the steroids).  Frequent: Cramps (if they occur, drink Gatorade and take over-the-counter Magnesium 450-500 mg  once to twice a day); water retention with temporary weight gain; increases in blood sugar; decreased immune system response; increased appetite.  Occasional: Facial flushing (red, warm cheeks); mood swings; menstrual changes.  Uncommon: Long-term decrease or suppression of natural hormones; bone thinning. (These are more common with higher doses or more frequent use. This is why we prefer that our patients avoid having any injection therapies in other practices.)   Very Rare: Severe mood changes; psychosis; aseptic necrosis.  From procedure: Some discomfort is to be expected once the numbing medicine wears off. This should be minimal if ice and heat are applied as instructed.  Call if: (When should I call?)  You experience numbness and weakness that gets worse with time, as opposed to wearing off.  New onset bowel or bladder incontinence. (Applies only to procedures done in the spine)  Emergency Numbers:  Durning business hours (Monday - Thursday, 8:00 AM - 4:00 PM) (Friday, 9:00 AM - 12:00 Noon): [phone]  After hours: [phone]  NOTE: If you are having a problem and are unable connect with, or to talk to a provider, then go to your nearest urgent care or emergency department. If the problem is serious and urgent, please call 911. ____________________________________________________________________________________________  Radiofrequency Lesioning Radiofrequency lesioning is a procedure that is performed to relieve pain. The procedure is often used for back, neck, or arm pain. Radiofrequency lesioning involves the use of a machine that creates radio waves to make heat. During the procedure, the heat is applied to the nerve that carries the pain signal. The heat damages the nerve and interferes with the pain signal. Pain relief usually starts about 2 weeks after the procedure and lasts for 6 months to 1 year. You will be awake during the procedure. You will need to be able to  talk with the health care provider during the procedure. Tell a health care provider about:  Any allergies you have.  All medicines you are taking, including vitamins, herbs, eye drops, creams, and over-the-counter medicines.  Any problems you or family members have had with anesthetic medicines.  Any blood disorders you have.  Any surgeries you have had.  Any medical conditions you have or have had.  Whether you are pregnant or may be pregnant. What are the risks? Generally, this is a safe procedure. However, problems may occur, including:  Pain or soreness at the injection site.  Allergic reaction to medicines given during the procedure.  Bleeding.  Infection at the injection site.  Damage to nerves or blood vessels. What happens before the procedure? Staying hydrated Follow instructions from your health care provider about hydration, which may include:  Up to 2 hours before the procedure - you may continue to drink clear liquids, such as water, clear fruit juice, black coffee, and plain tea. Eating and drinking Follow instructions from your health care provider about eating and drinking, which may include:  8 hours before the procedure - stop eating heavy meals or foods, such as meat, fried foods, or fatty foods.  6 hours before the procedure - stop eating light meals or foods, such as toast or cereal.  6 hours before the procedure - stop drinking milk  or drinks that contain milk.  2 hours before the procedure - stop drinking clear liquids. Medicines Ask your health care provider about:  Changing or stopping your regular medicines. This is especially important if you are taking diabetes medicines or blood thinners.  Taking medicines such as aspirin and ibuprofen. These medicines can thin your blood. Do not take these medicines unless your health care provider tells you to take them.  Taking over-the-counter medicines, vitamins, herbs, and supplements. General  instructions  Plan to have someone take you home from the hospital or clinic.  If you will be going home right after the procedure, plan to have someone with you for 24 hours.  Ask your health care provider what steps will be taken to help prevent infection. These may include: ? Removing hair at the procedure site. ? Washing skin with a germ-killing soap. ? Taking antibiotic medicine. What happens during the procedure?   An IV will be inserted into one of your veins.  You will be given one or more of the following: ? A medicine to help you relax (sedative). ? A medicine to numb the area (local anesthetic).  Your health care provider will insert a radiofrequency needle into the area to be treated. This is done with the help of a type of X-ray (fluoroscopy).  A wire that carries the radio waves (electrode) will be put through the radiofrequency needle.  An electrical pulse will be sent through the electrode to verify the correct nerve that is causing your pain. You will feel a tingling sensation, and you may have muscle twitching.  The tissue around the needle tip will be heated by an electric current that comes from the radiofrequency machine. This will numb the nerves.  The needle will be removed.  A bandage (dressing) will be put on the insertion area. The procedure may vary among health care providers and hospitals. What happens after the procedure?  Your blood pressure, heart rate, breathing rate, and blood oxygen level will be monitored until you leave the hospital or clinic.  Return to your normal activities as told by your health care provider. Ask your health care provider what activities are safe for you.  Do not drive for 24 hours if you were given a sedative during your procedure. Summary  Radiofrequency lesioning is a procedure that is performed to relieve pain. The procedure is often used for back, neck, or arm pain.  Radiofrequency lesioning involves the use of  a machine that creates radio waves to make heat.  Plan to have someone take you home from the hospital or clinic. Do not drive for 24 hours if you were given a sedative during your procedure.  Return to your normal activities as told by your health care provider. Ask your health care provider what activities are safe for you. This information is not intended to replace advice given to you by your health care provider. Make sure you discuss any questions you have with your health care provider. Document Revised: 11/29/2017 Document Reviewed: 11/29/2017 Else[employer] Patient Education  [place].

## 2019-04-14 NOTE — Telephone Encounter (Signed)
New Message:      Pt have an appt with Dr Curt Bears on Thursday(04-17-19). She wants to know if her daughter can come in with her her? She had a procedure on her back today and will need help getting around.

## 2019-04-14 NOTE — Telephone Encounter (Signed)
Left detailed message informing pt that her dtr may accompany her d/t need for assistance post surgery. Advised to call back if she had anything further to discuss prior to [place] Thursday.

## 2019-04-14 NOTE — Progress Notes (Signed)
Patient's Name: Susan [name]  MRN: [id number]  Referring Provider: [name], MD  DOB: [birth year]  PCP: [name], MD  DOS: 04/14/2019  Note by: Gillis Santa, MD  Service setting: Ambulatory outpatient  Specialty: Interventional Pain Management  Patient type: Established  Location: [facility] (AMB) Pain Management Facility  Visit type: Interventional Procedure   Primary Reason for Visit: Interventional Pain Management Treatment. CC: Back Pain (right, lower)   Procedure:          Anesthesia, Analgesia, Anxiolysis:  Type: Thermal Lumbar Facet, Medial Branch Radiofrequency Ablation/Neurotomy           Primary Purpose: Therapeutic Region: Posterolateral Lumbosacral Spine Level:L3, L4, L5,  Medial Branch Level(s). These levels will denervate the L3-4, L4-5,  lumbar facet joints. Laterality: Right (left done Oct 2019)  Type: Local Anesthesia  Local Anesthetic: Lidocaine 1-2%  Position: Prone   Indications: 1. Lumbar facet arthropathy    Susan [name] has been dealing with the above chronic pain for longer than three months and has either failed to respond, was unable to tolerate, or simply did not get enough benefit from other more conservative therapies including, but not limited to: 1. Over-the-counter medications 2. Anti-inflammatory medications 3. Muscle relaxants 4. Membrane stabilizers 5. Opioids 6. Physical therapy and/or chiropractic manipulation 7. Modalities (Heat, ice, etc.) 8. Invasive techniques such as nerve blocks. Susan [name] has attained more than 50% relief of the pain from a series of diagnostic injections conducted in separate occasions.  Pain Score: Pre-procedure: 5 /10 Post-procedure: 0-No pain/10  Patient stopped Eliquis 3 days prior  Pre-op Assessment:  Susan [name] is a 85 y.o. (year old), female patient, seen today for interventional treatment. She  has a past surgical history that includes left heart catheterization with coronary angiogram  (10/19/2012); percutaneous coronary stent intervention (pci-s) (10/19/2012); percutaneous coronary stent intervention (pci-s) (N/A, 10/23/2012); Abdominal hysterectomy; Cardiac catheterization (N/A, 05/05/2015); Breast excisional biopsy (Right, 1956); PORTA CATH REMOVAL (N/A, 04/05/2017); and Cardioversion (N/A, 07/27/2017). Susan [name] has a current medication list which includes the following prescription(s): acetaminophen, aspirin ec, atorvastatin, eliquis, ezetimibe, fluticasone, furosemide, isosorbide mononitrate, loperamide, losartan, multivitamin with minerals, nitrostat, omeprazole, vitamin b-12, gabapentin, and oxybutynin, and the following Facility-Administered Medications: sodium chloride flush and sodium chloride flush. Her primarily concern today is the Back Pain (right, lower)  Initial Vital Signs:  Pulse/HCG Rate: 78  Temp: (!) 97.1 F (36.2 C) Resp: 18 BP: 118/75 SpO2: 98 %  BMI: Estimated body mass index is 38.04 kg/m as calculated from the following:   Height as of this encounter: 5\' 2"  (1.575 m).   Weight as of this encounter: 208 lb (94.3 kg).  Risk Assessment: Allergies: Reviewed. She is allergic to ace inhibitors and penicillins.  Allergy Precautions: None required Coagulopathies: Reviewed. None identified.  Blood-thinner therapy: None at this time Active Infection(s): Reviewed. None identified. Susan [name] is afebrile  Site Confirmation: Susan [name] was asked to confirm the procedure and laterality before marking the site Procedure checklist: Completed Consent: Before the procedure and under the influence of no sedative(s), amnesic(s), or anxiolytics, the patient was informed of the treatment options, risks and possible complications. To fulfill our ethical and legal obligations, as recommended by the American Medical Association's Code of Ethics, I have informed the patient of my clinical impression; the nature and purpose of the treatment or procedure; the risks, benefits,  and possible complications of the intervention; the alternatives, including doing nothing; the risk(s) and benefit(s) of the alternative treatment(s) or procedure(s); and the risk(s)  and benefit(s) of doing nothing. The patient was provided information about the general risks and possible complications associated with the procedure. These may include, but are not limited to: failure to achieve desired goals, infection, bleeding, organ or nerve damage, allergic reactions, paralysis, and death. In addition, the patient was informed of those risks and complications associated to Spine-related procedures, such as failure to decrease pain; infection (i.e.: Meningitis, epidural or intraspinal abscess); bleeding (i.e.: epidural hematoma, subarachnoid hemorrhage, or any other type of intraspinal or peri-dural bleeding); organ or nerve damage (i.e.: Any type of peripheral nerve, nerve root, or spinal cord injury) with subsequent damage to sensory, motor, and/or autonomic systems, resulting in permanent pain, numbness, and/or weakness of one or several areas of the body; allergic reactions; (i.e.: anaphylactic reaction); and/or death. Furthermore, the patient was informed of those risks and complications associated with the medications. These include, but are not limited to: allergic reactions (i.e.: anaphylactic or anaphylactoid reaction(s)); adrenal axis suppression; blood sugar elevation that in diabetics may result in ketoacidosis or comma; water retention that in patients with history of congestive heart failure may result in shortness of breath, pulmonary edema, and decompensation with resultant heart failure; weight gain; swelling or edema; medication-induced neural toxicity; particulate matter embolism and blood vessel occlusion with resultant organ, and/or nervous system infarction; and/or aseptic necrosis of one or more joints. Finally, the patient was informed that Medicine is not an exact science; therefore,  there is also the possibility of unforeseen or unpredictable risks and/or possible complications that may result in a catastrophic outcome. The patient indicated having understood very clearly. We have given the patient no guarantees and we have made no promises. Enough time was given to the patient to ask questions, all of which were answered to the patient's satisfaction. Susan [name] has indicated that she wanted to continue with the procedure. Attestation: I, the ordering provider, attest that I have discussed with the patient the benefits, risks, side-effects, alternatives, likelihood of achieving goals, and potential problems during recovery for the procedure that I have provided informed consent. Date  Time: 04/14/2019 10:55 AM  Pre-Procedure Preparation:  Monitoring: As per clinic protocol. Respiration, ETCO2, SpO2, BP, heart rate and rhythm monitor placed and checked for adequate function Safety Precautions: Patient was assessed for positional comfort and pressure points before starting the procedure. Time-out: I initiated and conducted the "Time-out" before starting the procedure, as per protocol. The patient was asked to participate by confirming the accuracy of the "Time Out" information. Verification of the correct person, site, and procedure were performed and confirmed by me, the nursing staff, and the patient. "Time-out" conducted as per Joint Commission's Universal Protocol (UP.01.01.01). Time: 1137  Description of Procedure:          Laterality: Right Levels:  L3, L4, L5,  Medial Branch Level(s), at the L3-4, L4-5 lumbar facet joints. Area Prepped: Lumbosacral Prepping solution: ChloraPrep (2% chlorhexidine gluconate and 70% isopropyl alcohol) Safety Precautions: Aspiration looking for blood return was conducted prior to all injections. At no point did we inject any substances, as a needle was being advanced. Before injecting, the patient was told to immediately notify me if she was  experiencing any new onset of "ringing in the ears, or metallic taste in the mouth". No attempts were made at seeking any paresthesias. Safe injection practices and needle disposal techniques used. Medications properly checked for expiration dates. SDV (single dose vial) medications used. After the completion of the procedure, all disposable equipment used was discarded in  the proper designated medical waste containers. Local Anesthesia: Protocol guidelines were followed. The patient was positioned over the fluoroscopy table. The area was prepped in the usual manner. The time-out was completed. The target area was identified using fluoroscopy. A 12-in long, straight, sterile hemostat was used with fluoroscopic guidance to locate the targets for each level blocked. Once located, the skin was marked with an approved surgical skin marker. Once all sites were marked, the skin (epidermis, dermis, and hypodermis), as well as deeper tissues (fat, connective tissue and muscle) were infiltrated with a small amount of a short-acting local anesthetic, loaded on a 10cc syringe with a 25G, 1.5-in  Needle. An appropriate amount of time was allowed for local anesthetics to take effect before proceeding to the next step. Local Anesthetic: Lidocaine 2.0% The unused portion of the local anesthetic was discarded in the proper designated containers. Technical explanation of process:  Radiofrequency Ablation (RFA)  L3 Medial Branch Nerve RFA: The target area for the L3 medial branch is at the junction of the postero-lateral aspect of the superior articular process and the superior, posterior, and medial edge of the transverse process of L4. Under fluoroscopic guidance, a Radiofrequency needle was inserted until contact was made with os over the superior postero-lateral aspect of the pedicular shadow (target area). Sensory and motor testing was conducted to properly adjust the position of the needle. Once satisfactory placement of  the needle was achieved, the numbing solution was slowly injected after negative aspiration for blood. 2.0 mL of the nerve block solution was injected without difficulty or complication. After waiting for at least 3 minutes, the ablation was performed. Once completed, the needle was removed intact. L4 Medial Branch Nerve RFA: The target area for the L4 medial branch is at the junction of the postero-lateral aspect of the superior articular process and the superior, posterior, and medial edge of the transverse process of L5. Under fluoroscopic guidance, a Radiofrequency needle was inserted until contact was made with os over the superior postero-lateral aspect of the pedicular shadow (target area). Sensory and motor testing was conducted to properly adjust the position of the needle. Once satisfactory placement of the needle was achieved, the numbing solution was slowly injected after negative aspiration for blood. 2.0 mL of the nerve block solution was injected without difficulty or complication. After waiting for at least 3 minutes, the ablation was performed. Once completed, the needle was removed intact. L5 Medial Branch Nerve RFA: The target area for the L5 medial branch is at the junction of the postero-lateral aspect of the superior articular process of S1 and the superior, posterior, and medial edge of the sacral ala. Under fluoroscopic guidance, a Radiofrequency needle was inserted until contact was made with os over the superior postero-lateral aspect of the pedicular shadow (target area). Sensory and motor testing was conducted to properly adjust the position of the needle. Once satisfactory placement of the needle was achieved, the numbing solution was slowly injected after negative aspiration for blood. 2.0 mL of the nerve block solution was injected without difficulty or complication. After waiting for at least 3 minutes, the ablation was performed. Once completed, the needle was removed  intact.  Radiofrequency Generator: NeuroTherm [id number] Sensory Stimulation Parameters: 50 Hz was used to locate & identify the nerve, making sure that the needle was positioned such that there was no sensory stimulation below 0.3 V or above 0.7 V. Motor Stimulation Parameters: 2 Hz was used to evaluate the motor component. Care was taken not  to lesion any nerves that demonstrated motor stimulation of the lower extremities at an output of less than 2.5 times that of the sensory threshold, or a maximum of 2.0 V. Lesioning Technique Parameters: Standard Radiofrequency settings. (Not bipolar or pulsed.) Temperature Settings: 80 degrees C Lesioning time: 60 seconds Intra-operative Compliance: Compliant Materials & Medications: Needle(s) (Electrode/Cannula) Type: Teflon-coated, curved tip, Radiofrequency needle(s) Gauge: 22G Length: 10cm Numbing solution: 6 cc solution made of 5 cc of 0.2% Ropivacaine and 1 cc of Decadron 10 mg/cc, 2 cc injected at each level as above.   Once the entire procedure was completed, the treated area was cleaned, making sure to leave some of the prepping solution back to take advantage of its long term bactericidal properties.  Illustration of the posterior view of the lumbar spine and the posterior neural structures. Laminae of L2 through S1 are labeled. DPRL5, dorsal primary ramus of L5; DPRS1, dorsal primary ramus of S1; DPR3, dorsal primary ramus of L3; FJ, facet (zygapophyseal) joint L3-L4; I, inferior articular process of L4; LB1, lateral branch of dorsal primary ramus of L1; IAB, inferior articular branches from L3 medial branch (supplies L4-L5 facet joint); IBP, intermediate branch plexus; MB3, medial branch of dorsal primary ramus of L3; NR3, third lumbar nerve root; S, superior articular process of L5; SAB, superior articular branches from L4 (supplies L4-5 facet joint also); TP3, transverse process of L3.  Vitals:   04/14/19 1145 04/14/19 1155 04/14/19 1200 04/14/19  1208  BP: (!) 122/59 (!) 119/54 (!) 121/56 123/61  Pulse: 66 71 70 72  Resp: 12 12 13 13   Temp:      TempSrc:      SpO2: 97% 98% 97% 98%  Weight:      Height:        Start Time: 1137 hrs. End Time: 1204 hrs.  Imaging Guidance (Spinal):          Type of Imaging Technique: Fluoroscopy Guidance (Spinal) Indication(s): Assistance in needle guidance and placement for procedures requiring needle placement in or near specific anatomical locations not easily accessible without such assistance. Exposure Time: Please see nurses notes. Contrast: None used. Fluoroscopic Guidance: I was personally present during the use of fluoroscopy. "Tunnel Vision Technique" used to obtain the best possible view of the target area. Parallax error corrected before commencing the procedure. "Direction-depth-direction" technique used to introduce the needle under continuous pulsed fluoroscopy. Once target was reached, antero-posterior, oblique, and lateral fluoroscopic projection used confirm needle placement in all planes. Images permanently stored in EMR. Interpretation: No contrast injected. I personally interpreted the imaging intraoperatively. Adequate needle placement confirmed in multiple planes. Permanent images saved into the patient's record.    Post-operative Assessment:  Post-procedure Vital Signs:  Pulse/HCG Rate: 72  Temp: (!) 97.1 F (36.2 C) Resp: 13 BP: 123/61 SpO2: 98 %  EBL: None  Complications: No immediate post-treatment complications observed by team, or reported by patient.  Note: The patient tolerated the entire procedure well. A repeat set of vitals were taken after the procedure and the patient was kept under observation following institutional policy, for this type of procedure. Post-procedural neurological assessment was performed, showing return to baseline, prior to discharge. The patient was provided with post-procedure discharge instructions, including a section on how to  identify potential problems. Should any problems arise concerning this procedure, the patient was given instructions to immediately contact us, at any time, without hesitation. In any case, we plan to contact the patient by telephone for a follow-up status report  regarding this interventional procedure.  Comments:  No additional relevant information. 5 out of 5 strength bilateral lower extremity: Plantar flexion, dorsiflexion, knee flexion, knee extension.  Plan of Care    Imaging Orders     DG PAIN CLINIC C-ARM 1-60 MIN NO REPORT Procedure Orders    No procedure(s) ordered today  Patient instructed to restart Eliquis tomorrow so long as she is not having any new lower extremity weakness.  Medications ordered for procedure: Meds ordered this encounter  Medications  . lidocaine (XYLOCAINE) 2 % (with pres) injection 400 mg  . dexamethasone (DECADRON) injection 10 mg  . ropivacaine (PF) 2 mg/mL (0.2%) (NAROPIN) injection 9 mL   Medications administered: We administered lidocaine, dexamethasone, and ropivacaine (PF) 2 mg/mL (0.2%).  See the medical record for exact dosing, route, and time of administration.  Disposition: Discharge home  Discharge Date & Time: 04/14/2019; 1209 hrs.   Physician-requested Follow-up: Return in about 6 weeks (around 05/26/2019) for Post Procedure Evaluation.  Future Appointments  Date Time Provider [place]  04/17/2019 11:30 AM Constance Haw, MD [facility]  04/28/2019  1:45 PM Bjorn Loser, MD [facility] None  05/26/2019  2:15 PM Gillis Santa, MD [facility] None  08/27/2019  2:30 PM [facility] None  08/27/2019  3:00 PM [facility]YN [facility] None  12/15/2019  9:30 AM [facility] None  12/15/2019 10:00 AM Earlie Server, MD [facility] None  12/15/2019 11:00 AM Noreene Filbert, MD [facility] None   Primary Care Physician: [name], MD Location: [facility] Note by: Gillis Santa, MD Date: 04/14/2019; Time: 12:47 PM  Disclaimer:  Medicine is not an exact science. The only guarantee in medicine is that nothing is guaranteed. It is important to note that the decision to proceed with this intervention was based on the information collected from the patient. The Data and conclusions were drawn from the patient's questionnaire, the interview, and the physical examination. Because the information was provided in large part by the patient, it cannot be guaranteed that it has not been purposely or unconsciously manipulated. Every effort has been made to obtain as much relevant data as possible for this evaluation. It is important to note that the conclusions that lead to this procedure are derived in large part from the available data. Always take into account that the treatment will also be dependent on availability of resources and existing treatment guidelines, considered by other Pain Management Practitioners as being common knowledge and practice, at the time of the intervention. For Medico-Legal purposes, it is also important to point out that variation in procedural techniques and pharmacological choices are the acceptable norm. The indications, contraindications, technique, and results of the above procedure should only be interpreted and judged by a Board-Certified Interventional Pain Specialist with extensive familiarity and expertise in the same exact procedure and technique.

## 2019-04-14 NOTE — Progress Notes (Signed)
Safety precautions to be maintained throughout the outpatient stay will include: orient to surroundings, keep bed in low position, maintain call bell within reach at all times, provide assistance with transfer out of bed and ambulation.  

## 2019-04-15 ENCOUNTER — Telehealth: Payer: Self-pay

## 2019-04-15 NOTE — Telephone Encounter (Signed)
Denies any needs at this time. Patient statedshe is not having any pain at this time. Instructed to call if needed.

## 2019-04-17 ENCOUNTER — Ambulatory Visit (INDEPENDENT_AMBULATORY_CARE_PROVIDER_SITE_OTHER): Payer: Medicare PPO | Admitting: Cardiology

## 2019-04-17 ENCOUNTER — Encounter: Payer: Self-pay | Admitting: Cardiology

## 2019-04-17 ENCOUNTER — Other Ambulatory Visit: Payer: Self-pay

## 2019-04-17 VITALS — BP 132/76 | HR 88 | Ht 62.0 in | Wt 209.0 lb

## 2019-04-17 DIAGNOSIS — I4821 Permanent atrial fibrillation: Secondary | ICD-10-CM | POA: Diagnosis not present

## 2019-04-17 NOTE — Patient Instructions (Signed)
Medication Instructions:  Your physician recommends that you continue on your current medications as directed. Please refer to the Current Medication list given to you today.  * If you need a refill on your cardiac medications before your next appointment, please call your pharmacy.   Labwork: None ordered If you have labs (blood work) drawn today and your tests are completely normal, you will receive your results only by:  [place] (if you have MyChart) OR  A paper copy in the mail If you have any lab test that is abnormal or we need to change your treatment, we will call you to review the results.  Testing/Procedures: None ordered  Follow-Up: Your physician wants you to follow-up in: 6 months with APP.  You will receive a reminder letter in the mail two months in advance. If you don't receive a letter, please call our office to schedule the follow-up appointment.  At [facility], you and your health needs are our priority.  As part of our continuing mission to provide you with exceptional heart care, we have created designated Provider Care Teams.  These Care Teams include your primary Cardiologist (physician) and Advanced Practice Providers (APPs -  Physician Assistants and Nurse Practitioners) who all work together to provide you with the care you need, when you need it.  You will need a follow up appointment in 12 months with [name].  Please call our office 2 months in advance to schedule this appointment.  You may see Dr Curt Bears or one of the following Advanced Practice Providers on your designated Care Team:    Chanetta Marshall, NP  Tommye Standard, PA-C  Oda Kilts, [place]  Thank you for choosing [facility]!!   Trinidad Curet, RN [phone]  Any Other Special Instructions Will Be Listed Below (If Applicable).

## 2019-04-17 NOTE — Progress Notes (Signed)
Electrophysiology Office Note   Date:  04/17/2019   ID:  ENGLISH Susan [name], DOB 1934-06-10, MRN [id number]  PCP:  Susan Pitch, MD  Cardiologist:  Susan [name] Primary Electrophysiologist:  Susan Haw, MD    No chief complaint on file.    History of Present Illness: Susan [name] is a 85 y.o. female who presents today for electrophysiology evaluation.   History of coronary artery disease. In July 2014, she presented to [facility] with an anterior wall STEMI and underwent emergent cardiac catheterization where her LAD was found to be totally occluded. She underwent stenting of her LAD eInitially she had significant wall motion abnormality involving the LAD territory. She also was found to have a 95% mid AV groove circumflex stenosis, on 10/23/2012 she underwent successful staged intervention with insertion with a DES stent into the circumflex vessel. She wore a cardiac monitor that showed heart rates as low as 20 but with sinus bradycardia in the 40s-50s and up to 2-3-second pauses.  Her heart rates were in the 60s-80s with activity.  She was sent to the emergency room, but did not get a pacemaker due to lack of symptoms.  Today, denies symptoms of palpitations, chest pain, shortness of breath, orthopnea, PND, lower extremity edema, claudication, dizziness, presyncope, syncope, bleeding, or neurologic sequela. The patient is tolerating medications without difficulties.  She has been feeling much more fatigued over the last few days.  Her husband unfortunately had a fall and has a fractured hip and is now in the hospital getting rehab.  She has been quite stressed about his overall condition and feels that this is likely contributing to her levels of fatigue.   Past Medical History:  Diagnosis Date  . Bradycardia   . Chronic back pain   . Coronary artery disease    a. s/p STEMI in 2014 with DES to LAD with staged PCI/DES placement to LCx  . Echoencephalogram  abnormality   . Heart murmur   . History of Holter monitoring   . Hyperlipidemia   . Hypertension   . Myocardial infarction ([place])   . Neuromuscular disorder ([facility])    neuropathy  . NSVT (nonsustained ventricular tachycardia) ([place])   . Personal history of chemotherapy    ENDOMETRIAL CA  . Personal history of radiation therapy    ENDOMETRIAL CA  . Uterine cancer ([place])    serrous   Past Surgical History:  Procedure Laterality Date  . ABDOMINAL HYSTERECTOMY    . BREAST EXCISIONAL BIOPSY Right 1956   NEG  . CARDIOVERSION N/A 07/27/2017   Procedure: CARDIOVERSION;  Surgeon: Susan Latch, MD;  Location: [place];  Service: Cardiovascular;  Laterality: N/A;  . LEFT HEART CATHETERIZATION WITH CORONARY ANGIOGRAM  10/19/2012   Procedure: LEFT HEART CATHETERIZATION WITH CORONARY ANGIOGRAM;  Surgeon: Susan Sine, MD;  Location: [facility] CATH LAB;  Service: Cardiovascular;;  . PERCUTANEOUS CORONARY STENT INTERVENTION (PCI-S)  10/19/2012   Procedure: PERCUTANEOUS CORONARY STENT INTERVENTION (PCI-S);  Surgeon: Susan Sine, MD;  Location: [facility] CATH LAB;  Service: Cardiovascular;;  . PERCUTANEOUS CORONARY STENT INTERVENTION (PCI-S) N/A 10/23/2012   Procedure: PERCUTANEOUS CORONARY STENT INTERVENTION (PCI-S);  Surgeon: Susan Sine, MD;  Location[facility] C[facility] LAB;  Service: Cardiovascular;  Laterality: N/A;  . PERIPHERAL VASCULAR CATHETERIZATION N/A 05/05/2015   Procedure: Susan [name] Cath Insertion;  Surgeon: Susan Huxley, MD;  Location: [place] CV LAB;  Service: Cardiovascular;  Laterality: N/A;  . PORTA CATH REMOVAL N/A 04/05/2017   Procedure: Susan [name]  CATH REMOVAL;  Surgeon: Susan Huxley, MD;  Location: [place] CV LAB;  Service: Cardiovascular;  Laterality: N/A;     Current Outpatient Medications  Medication Sig Dispense Refill  . acetaminophen (TYLENOL) 650 MG CR tablet Take 650 mg by mouth every 8 (eight) hours as needed for pain.    [name] aspirin EC 81 MG tablet Take 81 mg by mouth daily.    [name]  atorvastatin (LIPITOR) 80 MG tablet Take 1 tablet (80 mg total) by mouth daily at 6 PM. NEEDS APPOINTMENT WITH DR Claiborne Billings FOR FUTURE REFILLS 15 tablet 0  . ELIQUIS 5 MG TABS tablet TAKE 1 TABLET BY MOUTH TWICE A DAY 180 tablet 1  . ezetimibe (ZETIA) 10 MG tablet TAKE 1 TABLET BY MOUTH EVERY DAY 90 tablet 1  . fluticasone (FLONASE) 50 MCG/ACT nasal spray SPRAY 2 SPRAYS INTO EACH NOSTRIL EVERY DAY    . furosemide (LASIX) 20 MG tablet Take 20 mg by mouth daily.     . isosorbide mononitrate (IMDUR) 30 MG 24 hr tablet TAKE 1 TABLET (30 MG TOTAL) BY MOUTH DAILY. 30 tablet 2  . loperamide (IMODIUM A-D) 2 MG tablet Take 2 mg by mouth 4 (four) times daily as needed for diarrhea or loose stools.    [name] losartan (COZAAR) 25 MG tablet TAKE 2 TABLETS BY MOUTH EVERY DAY 180 tablet 1  . mirabegron ER (MYRBETRIQ) 50 MG TB24 tablet Take 50 mg by mouth daily.    . Multiple Vitamin (MULTIVITAMIN WITH MINERALS) TABS Take 1 tablet by mouth daily.    [name] NITROSTAT 0.4 MG SL tablet PLACE 1 TABLET UNDER THE TOUGUE EVERY 5 MINUTES FOR UP TO 3 DOSES AS NEEDED FOR CHEST PAIN 25 tablet 0  . omeprazole (PRILOSEC) 20 MG capsule Take 20 mg by mouth daily.    . pregabalin (LYRICA) 25 MG capsule Take 1 tab twice a day for a week then increase to 50 mg twice a day and continue that dose    . vitamin B-12 (CYANOCOBALAMIN) 1000 MCG tablet Take 1,000 mcg by mouth daily.     No current facility-administered medications for this visit.   Facility-Administered Medications Ordered in Other Visits  Medication Dose Route Frequency Provider Last Rate Last Admin  . sodium chloride flush (NS) 0.9 % injection 10 mL  10 mL Intravenous PRN Cammie Sickle, MD   10 mL at 06/06/16 1447  . sodium chloride flush (NS) 0.9 % injection 10 mL  10 mL Intravenous PRN Cammie Sickle, MD   10 mL at 08/15/16 1404    Allergies:   Ace inhibitors and Penicillins   Social History:  The patient  reports that she quit smoking about 45 years ago. She  has never used smokeless tobacco. She reports current alcohol use of about 1.0 standard drinks of alcohol per week. She reports that she does not use drugs.   Family History:  The patient's family history includes Breast cancer (age of onset: 42) in her mother; Diabetes in her brother and father; Heart disease in her father and mother; Stroke in her brother.   ROS:  Please see the history of present illness.   Otherwise, review of systems is positive for none.   All other systems are reviewed and negative.   PHYSICAL EXAM: VS:  BP 132/76   Pulse 88   Ht 5\' 2"  (1.575 m)   Wt 209 lb (94.8 kg)   SpO2 96%   BMI 38.23 kg/m  , BMI Body  mass index is 38.23 kg/m. GEN: Well nourished, well developed, in no acute distress  HEENT: normal  Neck: no JVD, carotid bruits, or masses Cardiac: irregular; no murmurs, rubs, or gallops,no edema  Respiratory:  clear to auscultation bilaterally, normal work of breathing GI: soft, nontender, nondistended, + BS MS: no deformity or atrophy  Skin: warm and dry Neuro:  Strength and sensation are intact Psych: euthymic mood, full affect  EKG:  EKG is ordered today. Personal review of the ekg ordered shows atrial fibrillation, left bundle branch block  Recent Labs: 02/26/2019: ALT 14; BUN 18; Creatinine, Ser 1.06; Hemoglobin 12.6; Platelets 213; Potassium 3.7; Sodium 137    Lipid Panel     Component Value Date/Time   CHOL 116 06/06/2017 0922   CHOL 141 04/16/2013 0924   TRIG 75 06/06/2017 0922   TRIG 112 04/16/2013 0924   HDL 51 06/06/2017 0922   HDL 53 04/16/2013 0924   CHOLHDL 2.3 06/06/2017 0922   CHOLHDL 2.2 06/19/2016 0950   VLDL 20 06/19/2016 0950   LDLCALC 50 06/06/2017 0922   LDLCALC 66 04/16/2013 0924     Wt Readings from Last 3 Encounters:  04/17/19 209 lb (94.8 kg)  04/14/19 208 lb (94.3 kg)  02/26/19 210 lb 11.2 oz (95.6 kg)      Other studies Reviewed: Additional studies/ records that were reviewed today include: TTE  01/04/17 Review of the above records today demonstrates:  - Left ventricle: The cavity size was normal. There was moderate   concentric hypertrophy. Systolic function was mildly to   moderately reduced. The estimated ejection fraction was in the   range of 40% to 45%. Severe hypokinesis of the inferior,   inferoseptal, and apical myocardium. Mild anteroseptal and   inferolateral hypokinesis. Doppler parameters are consistent with   abnormal left ventricular relaxation (grade 1 diastolic   dysfunction). Doppler parameters are consistent with high   ventricular filling pressure. - Aortic valve: There was mild stenosis. Peak velocity (S): 238   cm/s. Mean gradient (S): 12 mm Hg. Valve area (VTI): 2.7 cm^2.   Valve area (Vmax): 2.18 cm^2. Valve area (Vmean): 2.39 cm^2. - Mitral valve: Calcified annulus. Severe thickening and   calcification. Transvalvular velocity was within the normal   range. There was no evidence for stenosis. There was mild   regurgitation. Valve area by continuity equation (using LVOT   flow): 3.08 cm^2. - Left atrium: The atrium was severely dilated. - Right ventricle: The cavity size was normal. Wall thickness was   normal. Systolic function was normal. - Tricuspid valve: There was no regurgitation. - Pulmonary arteries: Systolic pressure was within the normal   range. PA peak pressure: 32 mm Hg (S). - Pericardium, extracardiac: A trivial pericardial effusion was   identified.  Holter 01/17/17 - personally reviewed Mark[facility] abnormal monitor demonstrating predominantly underlying atrial fibrillation with slow ventricular rate.  The average heart beat was 53 bpm.  The maximum heart rate was 109 bpm.  During the monitoring period, the patient had significant bradycardia with heart rates down in the mid to low 30s.  Th there were 2 episodes with heart rates below 30 at 25 bpm  and at 19 bpm immediately thereafter at 9:57 PM on 01/10/2017.  On 01/11/2017 at 7:47 AM heart  rate was 24 bpm.   ASSESSMENT AND PLAN:  1.  Sick sinus syndrome: Rates in the 50s and atrial fibrillation.  Minimally symptomatic.  No plans to return to sinus rhythm.  No changes.  2.  Coronary artery disease: No current chest pain.  3.  Persistent atrial fibrillation: Currently on a rate control strategy.  On Eliquis.  CHA2DS2-VASc of 4.    Current medicines are reviewed at length with the patient today.   The patient does not have concerns regarding her medicines.  The following changes were made today: none  Labs/ tests ordered today include:  Orders Placed This Encounter  Procedures  . EKG 12-Lead     Disposition:   FU with Shaneil Yazdi 2 months  Signed, Charie Pinkus Meredith Leeds, MD  04/17/2019 11:51 AM     [facility] [address] [place] [address] [phone] (office) [phone] (fax)

## 2019-04-28 ENCOUNTER — Ambulatory Visit: Payer: Medicare Other | Admitting: Urology

## 2019-05-08 ENCOUNTER — Other Ambulatory Visit: Payer: Self-pay | Admitting: Cardiovascular Disease

## 2019-05-16 ENCOUNTER — Telehealth: Payer: Self-pay | Admitting: Cardiovascular Disease

## 2019-05-16 NOTE — Telephone Encounter (Signed)
Per call from [facility] with pt on the line they need a new script sent into   [facility]  Call ( with any questions ) phone #  [phone] [place] and filter

## 2019-05-20 NOTE — Telephone Encounter (Signed)
Spoke with patient to see exactly what she is needing. [facility] does not have any record of her in their system. She states That she wants to be transferred from [facility] to [place] due to her insurance changing. Per sharon at [facility] they take state [employer] just not regular [employer]. She had me to fax the current insurance card to her. She will have Angie reach out to her if they can continue to assist her. If not they will forward documents to [place].

## 2019-05-26 ENCOUNTER — Encounter: Payer: Self-pay | Admitting: Student in an Organized Health Care Education/Training Program

## 2019-05-26 ENCOUNTER — Other Ambulatory Visit: Payer: Self-pay

## 2019-05-26 ENCOUNTER — Ambulatory Visit
Payer: Medicare PPO | Attending: Student in an Organized Health Care Education/Training Program | Admitting: Student in an Organized Health Care Education/Training Program

## 2019-05-26 VITALS — BP 92/68 | HR 72 | Temp 97.5°F | Resp 16 | Ht 64.0 in | Wt 210.0 lb

## 2019-05-26 DIAGNOSIS — M47816 Spondylosis without myelopathy or radiculopathy, lumbar region: Secondary | ICD-10-CM

## 2019-05-26 DIAGNOSIS — C801 Malignant (primary) neoplasm, unspecified: Secondary | ICD-10-CM

## 2019-05-26 DIAGNOSIS — G63 Polyneuropathy in diseases classified elsewhere: Secondary | ICD-10-CM | POA: Diagnosis not present

## 2019-05-26 MED ORDER — TIZANIDINE HCL 4 MG PO TABS: 4.0000 mg | ORAL_TABLET | Freq: Every evening | ORAL | 2 refills | Status: AC | PRN

## 2019-05-26 NOTE — Progress Notes (Signed)
PROVIDER NOTE: Information contained herein reflects review and annotations entered in association with encounter. Interpretation of such information and data should be left to medically-trained personnel. Information provided to patient can be located elsewhere in the medical record under "Patient Instructions". Document created using STT-dictation technology, any transcriptional errors that may result from process are unintentional.    Patient: Susan [name]  Service Category: E/M  Provider: Gillis Santa, MD  DOB: [birth year]  DOS: 05/26/2019  Referring Provider: [name], MD  MRN: [id number]  Setting: Ambulatory outpatient  PCP: [name], MD  Type: Established Patient  Specialty: Interventional Pain Management    Location: Office  Delivery: Face-to-face     Primary Reason(s) for Visit: Encounter for post-procedure evaluation of chronic illness with mild to moderate exacerbation CC: Back Pain (lower)  HPI  Susan [name] is a 85 y.o. year old, female patient, who comes today for a post-procedure evaluation. She has Old anterior wall myocardial infarction; Hypertensive heart disease without CHF; Ischemic cardiomyopathy; Coronary artery disease; Dyslipidemia; NSVT (nonsustained ventricular tachycardia) ([place]); Sick sinus syndrome ([place]); Kidney stones; CA skin, basal cell; Gastro-esophageal reflux disease without esophagitis; Lumbar canal stenosis; Osteopenia; Arthritis, degenerative; Carcinoma of endometrium ([place]); Neuralgia neuritis, sciatic nerve; Absence of bladder continence; Neuropathy associated with cancer ([place]); Sacral insufficiency fracture; Pulmonary nodules; Obesity (BMI 30-39.9); Bradycardia; Port-A-Cath in place; Persistent atrial fibrillation ([place]); Chemotherapy-induced peripheral neuropathy ([place]); Chemotherapy-induced neuropathy ([place]); Compression fracture of L5 vertebra with delayed healing; Hyperlipidemia; Neuropathy; and Sacral insufficiency fracture with delayed healing on  their problem list. Her primarily concern today is the Back Pain (lower)  Pain Assessment: Location: Lower, Right Back Radiating: patient states that when she is standing that her back "grabs" Onset: More than a month ago Duration:   Quality: Aching, Cramping(Grabbing) Severity: 0-No pain/10 (subjective, self-reported pain score)  Note: Reported level is compatible with observation.                         When using our objective Pain Scale, levels between 6 and 10/10 are said to belong in an emergency room, as it progressively worsens from a 6/10, described as severely limiting, requiring emergency care not usually available at an outpatient pain management facility. At a 6/10 level, communication becomes difficult and requires great effort. Assistance to reach the emergency department may be required. Facial flushing and profuse sweating along with potentially dangerous increases in heart rate and blood pressure will be evident. Effect on ADL: "i have been under a lot of stress lately with passing of her" Timing: Constant Modifying factors: rest, Tylenol BP: 92/68  HR: 72  Susan [name] comes in today for post-procedure evaluation.  Further details on both, my assessment(s), as well as the proposed treatment plan, please see below.  Post-Procedure Assessment  04/14/2019 Procedure: RIGHT L3,4,5 RFA  04/14/19;    Initial Analgesic Effects (1st post-procedure hour): 100 %            Sedation: Please see nurses note. When none is used, analgesia during this period is strictly due to the local anesthetic. When sedation is administered, analgesia may be a combination of the IV analgesic/anxiolytic, plus the effect of the local anesthetics used. Interpretative annotation: Clinically appropriate result. Analgesia during this period is likely to be Local Anesthetic and/or IV Sedative (Analgesic/Anxiolytic) related.          Persistent Analgesic Response (subsequent 4-6 hours post-procedure): 100 %  Analgesic effects during this period is associated to the localized infiltration of local anesthetics and therefore caries significant diagnostic value as to the etiological location, or anatomical origin, of the pain. Expected duration of relief is directly dependent on the pharmacodynamics of the local anesthetic used. Long-acting (4-6 hours) anesthetics used.  Interpretative annotation: Clinically appropriate result. Analgesia during this period is likely to be Local Anesthetic-related.          Analgesic Response past initial 6 hours post-procedure: 85 %(current)           Defined as the period of time past the expected duration of local anesthetics (1 hour for short-acting and 4-6 hours for long-acting). With the possible exception of prolonged sympathetic blockade from the local anesthetics, benefits during this period are typically attributed to, or associated with, other factors such as analgesic sensory neuropraxia, antiinflammatory effects, or beneficial biochemical changes provided by agents other than the local anesthetics.  Interpretative annotation: Clinically possible results. Good relief. Therapeutic success.    Benefit could signal adequate RF ablation.  Current benefits: Defined as reported results that persistent at this point in time.   Analgesia: >75 %            Function: Somewhat improved ROM: Somewhat improved Interpretative annotation: Ongoing benefit. Therapeutic success. Adequate RF ablation.          Interpretation: Results would suggest adequate radiofrequency ablation.                  Plan:  Please see "Plan of Care" for details.                Laboratory Chemistry Profile   Screening Lab Results  Component Value Date   MRSAPCR NEGATIVE 10/19/2012    Inflammation (CRP: Acute Phase) (ESR: Chronic Phase) No results found for: CRP, ESRSEDRATE, LATICACIDVEN                       Renal Lab Results  Component Value Date   BUN 18 02/26/2019    CREATININE 1.06 (H) 02/26/2019   GFRAA 57 (L) 02/26/2019   GFRNONAA 49 (L) 02/26/2019                             Hepatic Lab Results  Component Value Date   AST 25 02/26/2019   ALT 14 02/26/2019   ALBUMIN 4.0 02/26/2019                        Note: Lab results reviewed.  Recent Imaging Results   Results for orders placed in visit on 01/16/18  DG C-Arm 1-60 Min-No Report   Narrative Fluoroscopy was utilized by the requesting physician.  No radiographic  interpretation.    Interpretation Report: Fluoroscopy was used during the procedure to assist with needle guidance. The images were interpreted intraoperatively by the requesting physician.        Meds   Current Outpatient Medications:  .  acetaminophen (TYLENOL) 650 MG CR tablet, Take 650 mg by mouth every 8 (eight) hours as needed for pain., Disp: , Rfl:  .  aspirin EC 81 MG tablet, Take 81 mg by mouth daily., Disp: , Rfl:  .  atorvastatin (LIPITOR) 80 MG tablet, Take 1 tablet (80 mg total) by mouth daily at 6 PM. NEEDS APPOINTMENT WITH DR Claiborne Billings FOR FUTURE REFILLS, Disp: 15 tablet, Rfl: 0 .  ELIQUIS 5 MG TABS  tablet, TAKE 1 TABLET BY MOUTH TWICE A DAY, Disp: 180 tablet, Rfl: 1 .  ezetimibe (ZETIA) 10 MG tablet, TAKE 1 TABLET BY MOUTH EVERY DAY, Disp: 90 tablet, Rfl: 1 .  fluticasone (FLONASE) 50 MCG/ACT nasal spray, SPRAY 2 SPRAYS INTO EACH NOSTRIL EVERY DAY, Disp: , Rfl:  .  furosemide (LASIX) 20 MG tablet, Take 20 mg by mouth daily. , Disp: , Rfl:  .  isosorbide mononitrate (IMDUR) 30 MG 24 hr tablet, Take 1 tablet (30 mg total) by mouth daily. Please call to schedule follow-up appointment with Dr. Claiborne Billings. Thank you!, Disp: 30 tablet, Rfl: 0 .  loperamide (IMODIUM A-D) 2 MG tablet, Take 2 mg by mouth 4 (four) times daily as needed for diarrhea or loose stools., Disp: , Rfl:  .  losartan (COZAAR) 25 MG tablet, TAKE 2 TABLETS BY MOUTH EVERY DAY, Disp: 180 tablet, Rfl: 1 .  mirabegron ER (MYRBETRIQ) 50 MG TB24 tablet, Take 50 mg  by mouth daily., Disp: , Rfl:  .  Multiple Vitamin (MULTIVITAMIN WITH MINERALS) TABS, Take 1 tablet by mouth daily., Disp: , Rfl:  .  NITROSTAT 0.4 MG SL tablet, PLACE 1 TABLET UNDER THE TOUGUE EVERY 5 MINUTES FOR UP TO 3 DOSES AS NEEDED FOR CHEST PAIN, Disp: 25 tablet, Rfl: 0 .  omeprazole (PRILOSEC) 20 MG capsule, Take 20 mg by mouth daily., Disp: , Rfl:  .  pregabalin (LYRICA) 25 MG capsule, Take 1 tab twice a day for a week then increase to 50 mg twice a day and continue that dose, Disp: , Rfl:  .  vitamin B-12 (CYANOCOBALAMIN) 1000 MCG tablet, Take 1,000 mcg by mouth daily., Disp: , Rfl:  .  tiZANidine (ZANAFLEX) 4 MG tablet, Take 1 tablet (4 mg total) by mouth at bedtime as needed for muscle spasms., Disp: 30 tablet, Rfl: 2 No current facility-administered medications for this visit.  Facility-Administered Medications Ordered in Other Visits:  .  sodium chloride flush (NS) 0.9 % injection 10 mL, 10 mL, Intravenous, PRN, Charlaine Dalton R, MD, 10 mL at 06/06/16 1447 .  sodium chloride flush (NS) 0.9 % injection 10 mL, 10 mL, Intravenous, PRN, Cammie Sickle, MD, 10 mL at 08/15/16 1404  ROS  Constitutional: Denies any fever or chills Gastrointestinal: No reported hemesis, hematochezia, vomiting, or acute GI distress Musculoskeletal: Denies any acute onset joint swelling, redness, loss of ROM, or weakness Neurological: No reported episodes of acute onset apraxia, aphasia, dysarthria, agnosia, amnesia, paralysis, loss of coordination, or loss of consciousness  Allergies  Susan [name] is allergic to ace inhibitors and penicillins.  [place]  Drug: Susan [name]  reports no history of drug use. Alcohol:  reports current alcohol use of about 1.0 standard drinks of alcohol per week. Tobacco:  reports that she quit smoking about 45 years ago. She has never used smokeless tobacco. Medical:  has a past medical history of Bradycardia, Chronic back pain, Coronary artery disease,  Echoencephalogram abnormality, Heart murmur, History of Holter monitoring, Hyperlipidemia, Hypertension, Myocardial infarction [facility]), Neuromuscular disorder ([place]), NSVT (nonsustained ventricular tachycardia) ([place]), Personal history of chemotherapy, Personal history of radiation therapy, and Uterine cancer ([place]). Surgical: Susan [name]  has a past surgical history that includes left heart catheterization with coronary angiogram (10/19/2012); percutaneous coronary stent intervention (pci-s) (10/19/2012); percutaneous coronary stent intervention (pci-s) (N/A, 10/23/2012); Abdominal hysterectomy; Cardiac catheterization (N/A, 05/05/2015); Breast excisional biopsy (Right, 1956); PORTA CATH REMOVAL (N/A, 04/05/2017); and Cardioversion (N/A, 07/27/2017). Family: family history includes Breast cancer (age of onset: 80) in  her mother; Diabetes in her brother and father; Heart disease in her father and mother; Stroke in her brother.  Constitutional Exam  General appearance: Well nourished, well developed, and well hydrated. In no apparent acute distress Vitals:   05/26/19 1402  BP: 92/68  Pulse: 72  Resp: 16  Temp: (!) 97.5 F (36.4 C)  SpO2: 96%  Weight: 210 lb (95.3 kg)  Height: '5\' 4"'$  (1.626 m)   BMI Assessment: Estimated body mass index is 36.05 kg/m as calculated from the following:   Height as of this encounter: '5\' 4"'$  (1.626 m).   Weight as of this encounter: 210 lb (95.3 kg).  BMI interpretation table: BMI level Category Range association with higher incidence of chronic pain  <18 kg/m2 Underweight   18.5-24.9 kg/m2 Ideal body weight   25-29.9 kg/m2 Overweight Increased incidence by 20%  30-34.9 kg/m2 Obese (Class I) Increased incidence by 68%  35-39.9 kg/m2 Severe obesity (Class II) Increased incidence by 136%  >40 kg/m2 Extreme obesity (Class III) Increased incidence by 254%   Patient's current BMI Ideal Body weight  Body mass index is 36.05 kg/m. Ideal body weight: 54.7 kg (120 lb 9.5  oz) Adjusted ideal body weight: 70.9 kg (156 lb 5.7 oz)   BMI Readings from Last 4 Encounters:  05/26/19 36.05 kg/m  04/17/19 38.23 kg/m  04/14/19 38.04 kg/m  02/26/19 37.92 kg/m   Wt Readings from Last 4 Encounters:  05/26/19 210 lb (95.3 kg)  04/17/19 209 lb (94.8 kg)  04/14/19 208 lb (94.3 kg)  02/26/19 210 lb 11.2 oz (95.6 kg)    Psych/Mental status: Alert, oriented x 3 (person, place, & time)       Eyes: PERLA Respiratory: No evidence of acute respiratory distress  Cervical Spine Exam  Skin & Axial Inspection: No masses, redness, edema, swelling, or associated skin lesions Alignment: Symmetrical Functional ROM: Unrestricted ROM      Stability: No instability detected Muscle Tone/Strength: Functionally intact. No obvious neuro-muscular anomalies detected. Sensory (Neurological): Unimpaired Palpation: No palpable anomalies              Upper Extremity (UE) Exam    Side: Right upper extremity  Side: Left upper extremity  Skin & Extremity Inspection: Skin color, temperature, and hair growth are WNL. No peripheral edema or cyanosis. No masses, redness, swelling, asymmetry, or associated skin lesions. No contractures.  Skin & Extremity Inspection: Skin color, temperature, and hair growth are WNL. No peripheral edema or cyanosis. No masses, redness, swelling, asymmetry, or associated skin lesions. No contractures.  Functional ROM: Unrestricted ROM          Functional ROM: Unrestricted ROM          Muscle Tone/Strength: Functionally intact. No obvious neuro-muscular anomalies detected.  Muscle Tone/Strength: Functionally intact. No obvious neuro-muscular anomalies detected.  Sensory (Neurological): Unimpaired          Sensory (Neurological): Unimpaired          Palpation: No palpable anomalies              Palpation: No palpable anomalies              Provocative Test(s):  Phalen's test: deferred Tinel's test: deferred Apley's scratch test (touch opposite shoulder):  Action 1  (Across chest): deferred Action 2 (Overhead): deferred Action 3 (LB reach): deferred   Provocative Test(s):  Phalen's test: deferred Tinel's test: deferred Apley's scratch test (touch opposite shoulder):  Action 1 (Across chest): deferred Action 2 (Overhead): deferred Action 3 (  LB reach): deferred    Thoracic Spine Area Exam  Skin & Axial Inspection: No masses, redness, or swelling Alignment: Symmetrical Functional ROM: Unrestricted ROM Stability: No instability detected Muscle Tone/Strength: Functionally intact. No obvious neuro-muscular anomalies detected. Sensory (Neurological): Unimpaired Muscle strength & Tone: No palpable anomalies  Lumbar Exam  Skin & Axial Inspection: No masses, redness, or swelling Alignment: Symmetrical Functional ROM: Improved after treatment       Stability: No instability detected Muscle Tone/Strength: Functionally intact. No obvious neuro-muscular anomalies detected. Sensory (Neurological): Improved Palpation: No palpable anomalies       Provocative Tests: Hyperextension/rotation test: Improved after treatment       Lumbar quadrant test (Kemp's test): Improved after treatment       Lateral bending test: deferred today       Patrick's Maneuver: deferred today                   FABER* test: deferred today                   S-I anterior distraction/compression test: deferred today         S-I lateral compression test: deferred today         S-I Thigh-thrust test: deferred today         S-I Gaenslen's test: deferred today         *(Flexion, ABduction and External Rotation)  Gait & Posture Assessment  Ambulation: Unassisted Gait: Improved after treatment Posture: WNL   Lower Extremity Exam    Side: Right lower extremity  Side: Left lower extremity  Stability: No instability observed          Stability: No instability observed          Skin & Extremity Inspection: Skin color, temperature, and hair growth are WNL. No peripheral edema or  cyanosis. No masses, redness, swelling, asymmetry, or associated skin lesions. No contractures.  Skin & Extremity Inspection: Skin color, temperature, and hair growth are WNL. No peripheral edema or cyanosis. No masses, redness, swelling, asymmetry, or associated skin lesions. No contractures.  Functional ROM: Unrestricted ROM                  Functional ROM: Unrestricted ROM                  Muscle Tone/Strength: Functionally intact. No obvious neuro-muscular anomalies detected.  Muscle Tone/Strength: Functionally intact. No obvious neuro-muscular anomalies detected.  Sensory (Neurological): Unimpaired        Sensory (Neurological): Unimpaired        DTR: Patellar: deferred today Achilles: deferred today Plantar: deferred today  DTR: Patellar: deferred today Achilles: deferred today Plantar: deferred today  Palpation: No palpable anomalies  Palpation: No palpable anomalies   Assessment   Status Diagnosis  Controlled Controlled Controlled 1. Lumbar facet arthropathy   2. Spondylosis without myelopathy or radiculopathy, lumbar region   3. Neuropathy associated with cancer ([place])      Successful right L3, L4, L5 lumbar radiofrequency ablation.  Patient endorses pain relief in the region as approximately 85%.  Endorses improvement in functional status as well.  Finds it easier to walk around without her cane.  Does have occasional spasms of her lumbar paraspinal muscles especially with prolonged standing.  Unfortunately, the patient's husband passed since her last visit with me.  She is dealing with his loss and is getting things in order.  Provided my condolences and support.  Follow-up in 3 months.  She will  be starting physical therapy at the end of March.  Plan of Care  Pharmacotherapy (Medications Ordered): Meds ordered this encounter  Medications  . tiZANidine (ZANAFLEX) 4 MG tablet    Sig: Take 1 tablet (4 mg total) by mouth at bedtime as needed for muscle spasms.    Dispense:  30  tablet    Refill:  2    Do not place this medication, or any other prescription from our practice, on "Automatic Refill". Patient may have prescription filled one day early if pharmacy is closed on scheduled refill date.   Planned follow-up:   Return in about 3 months (around 08/26/2019) for Medication Management.     status post left L3, L4, L5 RFA on 01/16/2018. right L3, L4, L5 RFA: 04/14/2019; successful, 85% pain relief in lower lumbar region.    Recent Visits Date Type Provider Dept  04/14/19 Procedure visit Gillis Santa, MD [facility]  Showing recent visits within past 90 days and meeting all other requirements   Today's Visits Date Type Provider Dept  05/26/19 Office Visit Gillis Santa, MD [facility]  Showing today's visits and meeting all other requirements   Future Appointments No visits were found meeting these conditions.  Showing future appointments within next 90 days and meeting all other requirements   Primary Care Physician: [name], MD Location: [facility] Note by: Gillis Santa, MD Date: 05/26/2019; Time: 2:41 PM  Note: This dictation was prepared with Dragon dictation. Any transcriptional errors that may result from this process are unintentional.

## 2019-05-29 ENCOUNTER — Ambulatory Visit: Payer: Medicare PPO | Admitting: Cardiovascular Disease

## 2019-05-29 ENCOUNTER — Other Ambulatory Visit: Payer: Self-pay

## 2019-05-29 DIAGNOSIS — I2510 Atherosclerotic heart disease of native coronary artery without angina pectoris: Secondary | ICD-10-CM | POA: Diagnosis not present

## 2019-05-29 DIAGNOSIS — I495 Sick sinus syndrome: Secondary | ICD-10-CM

## 2019-05-29 DIAGNOSIS — G4733 Obstructive sleep apnea (adult) (pediatric): Secondary | ICD-10-CM

## 2019-05-29 DIAGNOSIS — I252 Old myocardial infarction: Secondary | ICD-10-CM

## 2019-05-29 DIAGNOSIS — I4821 Permanent atrial fibrillation: Secondary | ICD-10-CM

## 2019-05-29 DIAGNOSIS — I447 Left bundle-branch block, unspecified: Secondary | ICD-10-CM

## 2019-05-29 DIAGNOSIS — E785 Hyperlipidemia, unspecified: Secondary | ICD-10-CM

## 2019-05-29 DIAGNOSIS — Z7901 Long term (current) use of anticoagulants: Secondary | ICD-10-CM

## 2019-05-29 NOTE — Progress Notes (Signed)
Patient ID: [name], female   DOB: [birth year], 85 y.o.   MRN: [id number]      HPI: SAMAIYAH HOWES is a 85 y.o. female who presents to the office today for a 21 month follow-up cardiology evaluation.  Ms. Herber is originally from [place], [place] and now resides in [place], [place]. On 10/19/2012 she presented [facility] in transfer from [facility] with an anterior wall ST segment elevation myocardial infarction. She had chest pain of greater than 12 hours duration. I performed emergent cardiac catheterization  where her LAD was found to be totally occluded.  She underwent stenting of her LAD extending from the ostium to mid region with 3.0x38 mm and 3.0x16 mm Promus Premier DES stents. Initially she had significant wall motion abnormality involving the LAD territory. She also was found to have a 95% mid AV groove circumflex stenosis.  On 10/23/2012 she underwent successful staged intervention with insertion of a 2.25x12 mm Promus premier DES stent into the circumflex vessel. Subsequently she had done well. She did develop some hypotension leading to reduction in some of her medications.  She was enrolled in the [place] cardiac rehabilitation program. An exercise Myoview study on 12/03/2012 demonstrated significant salvage of myocardium with only a small area of distal antero-apical, apical, septal and  apical scar without evidence for ischemia. Post-rest ejection fraction was 66% and there was evidence for apical akinesis. Laboratory revealed an increased LDL particle number at 1352 despite being on atorvastatin 80 mg. She was contacted and Zetia 10 mg was just added to this atorvastatin dose. She did have 735 small LDL particles and a calculated LDL of 90 with HDL 40 and triglycerides 119.  She had developed hematuria and was found to have left kidney stones.  She underwent lithotripsy for her multiple kidney stones. A nuclear perfusion study prior to potential  discontinuance of dual anti-platelet therapy on 11/05/2013  was interpreted as low risk, but did show extensive scar in the distal LAD to return distribution.  Post-rest ejection fraction was 40%.  There was evidence for apical akinesis/mild dyskinesis.  She has been documented to have episodes of nonsustained ventricular tachycardia and has seen [name]..  Her bradycardia has been more profound while sleeping.  She was able to exercise and walk without limitation other than back discomfort.  She was evaluated for possible indication for pacemaker was not felt that a pacemaker is indicated presently.  Because of her bradycardia she has not been on beta blocker therapy.  She has had episodes of nonsustained VT noted on a CardioNet monitor.  When I  saw her she admitted that her sleep was poor and nonrestorative.  She snored loudly and required frequent daytime naps.  He was sleeping at least 8 hours per night, but poor quality.  She underwent a split-night protocol sleep study on 03/08/2015 and was found to have severe sleep apnea with an AHI of 35.8 overall and more severe with REM sleep with an AHI of 67.5 per hour.  She had significant oxygen desaturation to a nadir of 77%.  There was loud snoring.  I recommended tai chi CPAP therapy at 9 cm water pressure.  Since initiating CPAP therapy.  She has felt well.  She is no longer having to take naps.  She is using choice for DME company.  When I saw her one year ago, she was meeting Medicare compliance. A download was obtained from 05/01/2015 through 05/30/2015 with usage stays at 93% and  70% of days greater than 4 hours.  Her AHI was excellent at 1.9 with a set pressure of 9 cm.    She was diagnosed with endometrial cancer and underwent a total hysterectomy.  She also has a Port-A-Cath that had undergone chemotherapy and also completed 25 radiation remains to her body and 3 intravaginally..  She has developed a neuropathy for which he takes gabapentin.  He  is unaware of chest pain or palpitations.    When I saw her in 2018 she continued to use CPAP with 100% compliance.  A download from 06/12/2016 through 06/13/2016 100% of usage stays.  She has a ResMed AirSense10 AutoSet unit set at a pressure of 9 cm.  Her AHI is 1.1.  She is using a Risk manager &Paykel Eson nasal mask.  There is no leak.    Since I last saw her in March 2018, she has done well from a cardiac standpoint.  She denies chest pain, PND, orthopnea, or palpitations.  She continues to teach water aerobics.  However, she has continued to have issues as result of her radiation and chemotherapy for her endometrial CA.  She has developed a neuropathy and as result, has been walking with a cane or a walker.  Her gabapentin dose has been increased to 3 times per day.  She also has a small fracture in her sacrum.  She underwent initial evaluation at [facility] for possible kyphoplasty, but ultimately this was not done.  She also has degenerative disc disease in L3-L5 for which she has been seen pain management.  She has undergone injection.  She we having an additional injection next week and will need to hold Brilinta.  She also has had radiation-induced urinary incontinence.  A recent CT has demonstrated 5 small nodules in her long and she will be undergoing a follow-up CT in December.  When I saw her in October 2018 a CPAP download  from 11/27/2016 -12/26/2016 confirmed 100% compliance . AHI was1.2 at her 9 cm water pressure.  She was averaging 7 hours and 57 minutes of sleep per night.  An Epworth Sleepiness Scale score was 1.  She subsequently was found to have significant bradycardia on event monitor.  She had heart rates down to the mid to low 30s.  There were 2 episodes where her heart rate dropped to 25 bpm and then 19 bpm.  She was not on any AV nodal blocking drugs.  I recommended that she go to the emergency room for evaluation.  She was admitted overnight.  She was evaluated by [name] the following  day.  He reviewed her Holter monitor.  At that time, no AF was appreciated but there were periods of Mobitz 1 block, rates in the 20s with pauses and junctional escape beats.  Her telemetry on that particular day was showing sinus bradycardia in the 40s-50s.  The patient admitted that she was entirely asymptomatic.  At that point the decision was made not to place a permanent pacemaker.  She subsequently saw [facility] in office follow-up in January 2019.  When I saw her in March 2019 she was much more tired and sleepy.  She was not using CPAP since she was coughing.  She also was using Mucinex DM for her chest congestion.  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  She has issues with back discomfort.  During that evaluation, her ECG showed that she was in atrial fibrillation at 67  bpm with left bundle branch block with a rates variable ranging from low 40s to approximately 88.  An echo Doppler study on June 27, 2017 showed an EF of 40 to 45%.  There was mild aortic stenosis with a valve area estimated 1.82 cm.  She had moderate left atrial dilatation.  There was mild pulmonary hypertension with a PA peak pressure 36 mm.    She saw [name] for cardiology/EP follow-up on July 17, 2017.  At that time she was not having any episodes of significant dizziness, presyncope or syncope.  They discussed an attempt at trying to get her back into sinus rhythm and she has been on Eliquis.  She underwent an outpatient successful cardioversion on Jul 27, 2017 for restoration of sinus rhythm at 1 shock at 150 J.   I last saw her in June 2019 at which time she had issues with urinary incontinence as result of damage from radiation treatment.  She admits to 1-2+ ankle swelling.  She is unaware of her cardiac rhythm.  She is now back on CPAP with 100% compliance and is no longer coughing.    Since I last saw her, she has undergone recent evaluation with Dr.  Curt Bears on April 17, 2019.  Her ECG showed atrial fibrillation with left bundle branch block.  She denies episodes of palpitations, chest pain shortness of breath orthopnea PND, dizziness presyncope or syncope.  Unfortunately, her husband fell in January and injured his hip.  Following a short hospitalization he was advised that he goes to a rehab center for physical therapy.  Unfortunately he contracted Covid during that time, subsequently was transferred to [facility] and unfortunately died on May 09, 2019 after being on a ventilator with COVID-19 infection.  The last month has been difficult for [name].  However, she denies chest pain PND orthopnea or palpitations.  She is now living by herself.  She continues to use CPAP therapy.  Her insurance has changed and she will need to change her provider which currently is [facility] in [place].  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  She presents for evaluation  Past Medical History:  Diagnosis Date  . Bradycardia   . Chronic back pain   . Coronary artery disease    a. s/p STEMI in 2014 with DES to LAD with staged PCI/DES placement to LCx  . Echoencephalogram abnormality   . Heart murmur   . History of Holter monitoring   . Hyperlipidemia   . Hypertension   . Myocardial infarction ([place])   . Neuromuscular disorder (HCC)    neuropathy  . NSVT (nonsustained ventricular tachycardia) ([place])   . Personal history of chemotherapy    ENDOMETRIAL CA  . Personal history of radiation therapy    ENDOMETRIAL CA  . Uterine cancer ([place])    serrous    Past Surgical History:  Procedure Laterality Date  . ABDOMINAL HYSTERECTOMY    . BREAST EXCISIONAL BIOPSY Right 1956   NEG  . CARDIOVERSION N/A 07/27/2017   Procedure: CARDIOVERSION;  Surgeon: Skeet Latch, MD;  Location: [place];  Service: Cardiovascular;  Laterality: N/A;  . LEFT HEART  CATHETERIZATION WITH CORONARY ANGIOGRAM  10/19/2012   Procedure: LEFT HEART CATHETERIZATION WITH CORONARY ANGIOGRAM;  Surgeon: Troy Sine, MD;  Location: [facility] CATH LAB;  Service: Cardiovascular;;  . PERCUTANEOUS CORONARY STENT INTERVENTION (PCI-S)  10/19/2012   Procedure: PERCUTANEOUS CORONARY STENT INTERVENTION (  PCI-S);  Surgeon: Troy Sine, MD;  Location: [facility] CATH LAB;  Service: Cardiovascular;;  . PERCUTANEOUS CORONARY STENT INTERVENTION (PCI-S) N/A 10/23/2012   Procedure: PERCUTANEOUS CORONARY STENT INTERVENTION (PCI-S);  Surgeon: Troy Sine, MD;  Location: [facility] CATH LAB;  Service: Cardiovascular;  Laterality: N/A;  . PERIPHERAL VASCULAR CATHETERIZATION N/A 05/05/2015   Procedure: Glori Luis Cath Insertion;  Surgeon: Algernon Huxley, MD;  Location: [place] CV LAB;  Service: Cardiovascular;  Laterality: N/A;  . PORTA CATH REMOVAL N/A 04/05/2017   Procedure: PORTA CATH REMOVAL;  Surgeon: Algernon Huxley, MD;  Location: [place] CV LAB;  Service: Cardiovascular;  Laterality: N/A;    Allergies  Allergen Reactions  . Ace Inhibitors Cough  . Penicillins Rash and Other (See Comments)    Has patient had a PCN reaction causing immediate rash, facial/tongue/throat swelling, SOB or lightheadedness with hypotension: No Has patient had a PCN reaction causing severe rash involving mucus membranes or skin necrosis: No Has patient had a PCN reaction that required hospitalization: No Has patient had a PCN reaction occurring within the last 10 years: No If all of the above answers are "NO", then may proceed with Cephalosporin use.     Current Outpatient Medications  Medication Sig Dispense Refill  . acetaminophen (TYLENOL) 650 MG CR tablet Take 650 mg by mouth every 8 (eight) hours as needed for pain.    [name] aspirin EC 81 MG tablet Take 81 mg by mouth daily.    [name] atorvastatin (LIPITOR) 80 MG tablet Take 1 tablet (80 mg total) by mouth daily at 6 PM. NEEDS APPOINTMENT WITH DR Claiborne Billings FOR FUTURE REFILLS 15  tablet 0  . ELIQUIS 5 MG TABS tablet TAKE 1 TABLET BY MOUTH TWICE A DAY 180 tablet 1  . ezetimibe (ZETIA) 10 MG tablet TAKE 1 TABLET BY MOUTH EVERY DAY 90 tablet 1  . fluticasone (FLONASE) 50 MCG/ACT nasal spray SPRAY 2 SPRAYS INTO EACH NOSTRIL EVERY DAY    . furosemide (LASIX) 20 MG tablet Take 20 mg by mouth daily.     . isosorbide mononitrate (IMDUR) 30 MG 24 hr tablet Take 1 tablet (30 mg total) by mouth daily. Please call to schedule follow-up appointment with Dr. Claiborne Billings. Thank you! 30 tablet 0  . loperamide (IMODIUM A-D) 2 MG tablet Take 2 mg by mouth 4 (four) times daily as needed for diarrhea or loose stools.    [name] losartan (COZAAR) 25 MG tablet TAKE 2 TABLETS BY MOUTH EVERY DAY 180 tablet 1  . mirabegron ER (MYRBETRIQ) 50 MG TB24 tablet Take 50 mg by mouth daily.    . Multiple Vitamin (MULTIVITAMIN WITH MINERALS) TABS Take 1 tablet by mouth daily.    [name] NITROSTAT 0.4 MG SL tablet PLACE 1 TABLET UNDER THE TOUGUE EVERY 5 MINUTES FOR UP TO 3 DOSES AS NEEDED FOR CHEST PAIN 25 tablet 0  . omeprazole (PRILOSEC) 20 MG capsule Take 20 mg by mouth daily.    . pregabalin (LYRICA) 25 MG capsule Take 1 tab twice a day for a week then increase to 50 mg twice a day and continue that dose    . tiZANidine (ZANAFLEX) 4 MG tablet Take 1 tablet (4 mg total) by mouth at bedtime as needed for muscle spasms. 30 tablet 2  . vitamin B-12 (CYANOCOBALAMIN) 1000 MCG tablet Take 1,000 mcg by mouth daily.     No current facility-administered medications for this visit.   Facility-Administered Medications Ordered in Other Visits  Medication Dose Route Frequency  Provider Last Rate Last Admin  . sodium chloride flush (NS) 0.9 % injection 10 mL  10 mL Intravenous PRN Cammie Sickle, MD   10 mL at 06/06/16 1447  . sodium chloride flush (NS) 0.9 % injection 10 mL  10 mL Intravenous PRN Cammie Sickle, MD   10 mL at 08/15/16 1404    Social History   Socioeconomic History  . Marital status: Married     Spouse name: Not on file  . Number of children: Not on file  . Years of education: Not on file  . Highest education level: Not on file  Occupational History  . Not on file  Tobacco Use  . Smoking status: Former Smoker    Quit date: 1976    Years since quitting: 45.2  . Smokeless tobacco: Never Used  Substance and Sexual Activity  . Alcohol use: Yes    Alcohol/week: 1.0 standard drinks    Types: 1 Glasses of wine per week  . Drug use: No  . Sexual activity: Yes  Other Topics Concern  . Not on file  Social History Narrative  . Not on file   Social Determinants of Health   Financial Resource Strain:   . Difficulty of Paying Living Expenses: Not on file  Food Insecurity:   . Worried About Charity fundraiser in the Last Year: Not on file  . Ran Out of Food in the Last Year: Not on file  Transportation Needs:   . Lack of Transportation (Medical): Not on file  . Lack of Transportation (Non-Medical): Not on file  Physical Activity:   . Days of Exercise per Week: Not on file  . Minutes of Exercise per Session: Not on file  Stress:   . Feeling of Stress : Not on file  Social Connections:   . Frequency of Communication with Friends and Family: Not on file  . Frequency of Social Gatherings with Friends and Family: Not on file  . Attends Religious Services: Not on file  . Active Member of Clubs or Organizations: Not on file  . Attends Archivist Meetings: Not on file  . Marital Status: Not on file  Intimate Partner Violence:   . Fear of Current or Ex-Partner: Not on file  . Emotionally Abused: Not on file  . Physically Abused: Not on file  . Sexually Abused: Not on file    Socially she is married. There is no tobacco use. She drinks alcohol rarely.  Family History  Problem Relation Age of Onset  . Heart disease Mother   . Breast cancer Mother 46  . Heart disease Father   . Diabetes Father   . Diabetes Brother   . Stroke Brother   . Kidney cancer Neg Hx   .  Bladder Cancer Neg Hx     ROS General: Negative; No fevers, chills, or night sweats;  HEENT: Negative; No changes in vision or hearing, sinus congestion, difficulty swallowing Pulmonary: Negative; No cough, wheezing, shortness of breath, hemoptysis Cardiovascular:  See HPI GI: Positive for GERD; No nausea, vomiting, diarrhea, or abdominal pain UU:VOZDGUY of previous kidney stones, contributing to left flank pain intermittently; No dysuria, hematuria, or difficulty voiding; radiation-induced urinary incontinence Musculoskeletal: Positive for small sacral fracture, L3-L5 degenerative disc disease. Hematologic/Oncology: Positive for endometrial cancer, status post chemotherapy and radiation treatments. Endocrine: Negative; no heat/cold intolerance; no diabetes Neuro: Negative; no changes in balance, headaches Skin: Negative; No rashes or skin lesions Psychiatric: Negative; No behavioral problems, depression  Sleep: Positive for obstructive sleep apnea, on CPAP therapy.  No breakthrough snoring.  No daytime sleepiness. Other comprehensive 14 point system review is negative.   PE BP 124/60   Pulse 70   Ht '5\' 4"'  (1.626 m)   Wt 207 lb 12.8 oz (94.3 kg)   SpO2 96%   BMI 35.67 kg/m    Repeat blood pressure by me was 118/64  Wt Readings from Last 3 Encounters:  05/29/19 207 lb 12.8 oz (94.3 kg)  05/26/19 210 lb (95.3 kg)  04/17/19 209 lb (94.8 kg)   General: Alert, oriented, no distress.  Skin: normal turgor, no rashes, warm and dry HEENT: Normocephalic, atraumatic. Pupils equal round and reactive to light; sclera anicteric; extraocular muscles intact;  Nose without nasal septal hypertrophy Mouth/Parynx benign; Mallinpatti scale 3 Neck: No JVD, no carotid bruits; normal carotid upstroke Lungs: clear to ausculatation and percussion; no wheezing or rales Chest wall: without tenderness to palpitation Heart: PMI not displaced, irregularly irregular rhythm consistent with atrial  fibrillation with rate controlled around 70, s1 s2 normal, 1/6 systolic murmur, no diastolic murmur, no rubs, gallops, thrills, or heaves Abdomen: soft, nontender; no hepatosplenomehaly, BS+; abdominal aorta nontender and not dilated by palpation. Back: no CVA tenderness Pulses 2+ Musculoskeletal: full range of motion, normal strength, no joint deformities Extremities: no clubbing cyanosis or edema, Homan's sign negative  Neurologic: grossly nonfocal; Cranial nerves grossly wnl Psychologic: Normal mood and affect   ECG (independently read by me): Atrial fibrillation at 70; LBBB, QTc 453  June 2019 ECG (independently read by me): Atrial fibrillation with ventricular rate at 53 bpm.  Left bundle branch block with repolarization changes.  QTc interval 4 1 ms.  June 15, 2017 ECG (independently read by me): Atrial fibrillation at 67 bpm.  Left bundle branch block. Variable rate ranging from probably in the , low 40s to approximately 88  October 2018 ECG (independently read by me): Marked sinus bradycardia with PACs.  Ventricular rate at 43.  Left bundle branch block with repolarization changes.  March 2018 ECG (independently read by me): Sinus bradycardia 57 bpm.  First-degree AV block with a PR interval 314 ms. .She now has a left bundle-branch block which is new since her prior tracing.  March 2017 ECG (independently read by me): Sinus bradycardia 52 bpm with first-degree block.  PR interval 284 ms.  LVH by voltage.  QS V1 through V3 concordant with old anteroseptal MI.  March 2016 ECG (independently read by me): Normal sinus rhythm.  Old anteroseptal MI.  First-degree AV block.  October 27, 2013 ECG (independently read by me): Normal sinus rhythm at 60 beats per minute.  First degree AV block with PR interval at 276 ms.  Old anteroseptal MI with QS complex in V1 and V2.  EKG (independently read by me): Sinus rhythm at 55 beats per minute. First degree AV block. Poor progression compatible with  her prior septal infarction. T-wave changes anterolaterally.  LABS: BMP Latest Ref Rng & Units 02/26/2019 12/16/2018 09/24/2018  Glucose 70 - 99 mg/dL 125(H) 137(H) 104(H)  BUN 8 - 23 mg/dL '18 19 20  ' Creatinine 0.44 - 1.00 mg/dL 1.06(H) 0.96 0.94  Sodium 135 - 145 mmol/L 137 140 141  Potassium 3.5 - 5.1 mmol/L 3.7 3.8 4.1  Chloride 98 - 111 mmol/L 103 105 106  CO2 22 - 32 mmol/L '26 26 27  ' Calcium 8.9 - 10.3 mg/dL 9.5 9.5 9.5   Hepatic Function Latest Ref Rng & Units 02/26/2019 12/16/2018  09/16/2018  Total Protein 6.5 - 8.1 g/dL 6.9 7.0 6.8  Albumin 3.5 - 5.0 g/dL 4.0 4.0 4.1  AST 15 - 41 U/L '25 25 24  ' ALT 0 - 44 U/L '14 18 14  ' Alk Phosphatase 38 - 126 U/L 92 91 85  Total Bilirubin 0.3 - 1.2 mg/dL 0.9 0.7 0.9  Bilirubin, Direct 0.00 - 0.40 mg/dL - - -   CBC Latest Ref Rng & Units 02/26/2019 12/16/2018 09/16/2018  WBC 4.0 - 10.5 K/uL 5.6 6.0 5.3  Hemoglobin 12.0 - 15.0 g/dL 12.6 12.8 13.1  Hematocrit 36.0 - 46.0 % 38.7 38.1 39.5  Platelets 150 - 400 K/uL 213 198 204   Lab Results  Component Value Date   MCV 93.7 02/26/2019   MCV 92.5 12/16/2018   MCV 93.2 09/16/2018   Lab Results  Component Value Date   TSH 0.97 06/19/2016   Lab Results  Component Value Date   HGBA1C 5.7 (H) 10/19/2012   Lipid Panel     Component Value Date/Time   CHOL 116 06/06/2017 0922   CHOL 141 04/16/2013 0924   TRIG 75 06/06/2017 0922   TRIG 112 04/16/2013 0924   HDL 51 06/06/2017 0922   HDL 53 04/16/2013 0924   CHOLHDL 2.3 06/06/2017 0922   CHOLHDL 2.2 06/19/2016 0950   VLDL 20 06/19/2016 0950   LDLCALC 50 06/06/2017 0922   LDLCALC 66 04/16/2013 0924   RADIOLOGY: No results found.  IMPRESSION:  1. Permanent atrial fibrillation ([place])   2. Anticoagulation adequate   3. Old MI (myocardial infarction): Anterior STEMI 10/19/2012   4. CAD in native artery: Staged PCI to circumflex and moderate disease in RCA   5. OSA (obstructive sleep apnea)   6. Hyperlipidemia with target LDL less than 70    7. Left bundle branch block   8. Sick sinus syndrome [facility])     ASSESSMENT AND PLAN: Ms. Carole Binning is a 85 year-old female who presented to [facility] on 10/19/2012 with a STEMI after an approximate 12 hour delay with back and chest discomfort. When she arrived to [facility] she had excellent door to balloon time at 27 minutes in the setting of an anterior wall ST segment elevation myocardial infarction. She underwent successful stenting of her LAD and because of diffuse disease tandem DES stents were inserted.  She underwent staged intervention to the circumflex  4 days later. She had concomitant 50 - 60% RCA narrowing and a large dominant right coronary artery.  She has a history of ACE inhibitor induced cough, but has tolerated ARB therapy with losartan .  A nuclear perfusion study  demonstrated significant salvage of myocardium in the majority of the LAD territory and showed distal apical scar.  She has chronic left bundle branch block.  She is now felt to have  permanent atrial fibrillation and has been on Eliquis for anticoagulation. Her cha2ds2vasc score is 4.   Her echo Doppler was reviewed with her and showed an EF of 40 to 45% with moderate LVH and septal and apical akinesis secondary to her prior late presentation anterior MI.  There was very mild aortic stenosis.  She had moderate left atrial dilatation.  Presently, her blood pressure is stable on her regimen consisting of furosemide 20 mg daily, isosorbide 30 mg, losartan 50 mg.  She is not having recurrent anginal symptoms on her current regimen.  She is tolerating Eliquis without bleeding.  She has followed with [name] and at present there is no  need for pacemaker insertion.  She is on combination therapy with Zetia 10 mg and atorvastatin 80 mg for hyperlipidemia with target LDL less than 70.  She has continued to use CPAP.  I reviewed her most recent download from February 2 through May 28, 2019 which continues to show  excellent compliance.  At a 9 cm set pressure AHI is excellent at 2.4.  With her insurance change we will need to transition her from [facility] D[facility] to [place].  I had a lengthy discussion with her concerning her husband and his unfortunate recent Covid exposure with subsequent death.  I will see her in 1 year for reevaluation or sooner as needed  Troy Sine, MD, [facility]  05/31/2019 12:27 PM

## 2019-05-29 NOTE — Patient Instructions (Signed)

## 2019-05-31 ENCOUNTER — Encounter: Payer: Self-pay | Admitting: Cardiovascular Disease

## 2019-06-03 ENCOUNTER — Telehealth: Payer: Self-pay | Admitting: *Deleted

## 2019-06-03 NOTE — Telephone Encounter (Signed)
CPAP therapy orders and records sent to [facility]. Patient transferred from [facility].

## 2019-06-05 ENCOUNTER — Other Ambulatory Visit: Payer: Self-pay | Admitting: Cardiovascular Disease

## 2019-06-09 ENCOUNTER — Other Ambulatory Visit: Payer: Self-pay | Admitting: Family Medicine

## 2019-06-09 DIAGNOSIS — Z1231 Encounter for screening mammogram for malignant neoplasm of breast: Secondary | ICD-10-CM

## 2019-06-21 ENCOUNTER — Other Ambulatory Visit: Payer: Self-pay | Admitting: Cardiovascular Disease

## 2019-06-24 ENCOUNTER — Other Ambulatory Visit: Payer: Self-pay

## 2019-06-24 ENCOUNTER — Ambulatory Visit: Payer: Medicare PPO | Attending: Neurology

## 2019-06-24 DIAGNOSIS — R2681 Unsteadiness on feet: Secondary | ICD-10-CM | POA: Insufficient documentation

## 2019-06-24 DIAGNOSIS — M6281 Muscle weakness (generalized): Secondary | ICD-10-CM | POA: Insufficient documentation

## 2019-06-24 NOTE — Therapy (Signed)
[place] MAIN [facility] SERVICES [address] [place], [place], [address] Phone: [phone]   Fax:  [phone]  Physical Therapy Evaluation  Patient Details  Name: Susan [name] MRN: [id number] Date of Birth: [birth year] Referring Provider (PT): Dr. Melrose Nakayama   Encounter Date: 06/24/2019  PT End of Session - 06/25/19 0813    Visit Number  1    Number of Visits  25    Date for PT Re-Evaluation  09/16/19    Authorization Type  eval 06/24/19    PT Start Time  1430    PT Stop Time  1530    PT Time Calculation (min)  60 min    Equipment Utilized During Treatment  Gait belt    Activity Tolerance  Patient tolerated treatment well    Behavior During Therapy  [facility] for tasks assessed/performed       Past Medical History:  Diagnosis Date  . Bradycardia   . Chronic back pain   . Coronary artery disease    a. s/p STEMI in 2014 with DES to LAD with staged PCI/DES placement to LCx  . Echoencephalogram abnormality   . Heart murmur   . History of Holter monitoring   . Hyperlipidemia   . Hypertension   . Myocardial infarction ([place])   . Neuromuscular disorder (HCC)    neuropathy  . NSVT (nonsustained ventricular tachycardia) ([place])   . Personal history of chemotherapy    ENDOMETRIAL CA  . Personal history of radiation therapy    ENDOMETRIAL CA  . Uterine cancer ([place])    serrous    Past Surgical History:  Procedure Laterality Date  . ABDOMINAL HYSTERECTOMY    . BREAST EXCISIONAL BIOPSY Right 1956   NEG  . CARDIOVERSION N/A 07/27/2017   Procedure: CARDIOVERSION;  Surgeon: Skeet Latch, MD;  Location: [place];  Service: Cardiovascular;  Laterality: N/A;  . LEFT HEART CATHETERIZATION WITH CORONARY ANGIOGRAM  10/19/2012   Procedure: LEFT HEART CATHETERIZATION WITH CORONARY ANGIOGRAM;  Surgeon: Troy Sine, MD;  Location: [facility] CATH LAB;  Service: Cardiovascular;;  . PERCUTANEOUS CORONARY STENT INTERVENTION (PCI-S)  10/19/2012   Procedure:  PERCUTANEOUS CORONARY STENT INTERVENTION (PCI-S);  Surgeon: Troy Sine, MD;  Location: [facility] CATH LAB;  Service: Cardiovascular;;  . PERCUTANEOUS CORONARY STENT INTERVENTION (PCI-S) N/A 10/23/2012   Procedure: PERCUTANEOUS CORONARY STENT INTERVENTION (PCI-S);  Surgeon: Troy Sine, MD;  Location: [facility] CATH LAB;  Service: Cardiovascular;  Laterality: N/A;  . PERIPHERAL VASCULAR CATHETERIZATION N/A 05/05/2015   Procedure: Glori Luis Cath Insertion;  Surgeon: Algernon Huxley, MD;  Location: [place] CV LAB;  Service: Cardiovascular;  Laterality: N/A;  . PORTA CATH REMOVAL N/A 04/05/2017   Procedure: PORTA CATH REMOVAL;  Surgeon: Algernon Huxley, MD;  Location: [place] CV LAB;  Service: Cardiovascular;  Laterality: N/A;    There were no vitals filed for this visit.      Subjective Assessment - 06/24/19 1454    Subjective  Neuropathy    Pertinent History  Pt reports imbalance related to chemo-induced neuropathy which started 4 years ago. Neuropathy occurs in stocking distribution from knees to feet. Symptoms have been unchanged since that time. She has had one fall in the last 6 months. Denies dizziness or vertigo. No heachaches/migraines. She has a history of chronic back pain and has had a bilateral nerve ablation. Her husband passed away 4 weeks ago after contracting COVID.    How long can you stand comfortably?  5 minutes  How long can you walk comfortably?  5 or 10 minutes    Patient Stated Goals  Strengthen her back muscles to decrease pain and improve her balance    Currently in Pain?  Yes    Pain Score  2    Worst: 8/10, Best: 2/10, "It's typically a 5/10"   Pain Location  Back    Pain Orientation  Other (Comment);Lower   "Right in the middle near the spine"   Pain Descriptors / Indicators  Stabbing    Pain Type  Chronic pain    Pain Radiating Towards  None    Pain Onset  More than a month ago    Pain Frequency  Constant    Aggravating Factors   Extended time in a single position,  "sometimes it just grabs me unexpectantly"    Pain Relieving Factors  Tylenol, ice, heat, elevating feet    Effect of Pain on Daily Activities  --               [facility] PT Assessment - 06/25/19 0811      Assessment   Medical Diagnosis  Neuropathy    Referring Provider (PT)  Dr. Melrose Nakayama    Onset Date/Surgical Date  06/25/15   Approximate   Next MD Visit  in 3 months with neurology    Prior Therapy  None for balance      Precautions   Precautions  Fall      Restrictions   Weight Bearing Restrictions  No      Balance Screen   Has the patient fallen in the past 6 months  Yes    How many times?  1    Has the patient had a decrease in activity level because of a fear of falling?   Yes    Is the patient reluctant to leave their home because of a fear of falling?   Yes      [place] residence    Living Arrangements  Alone    Available Help at Discharge  Family    Type of [place] to enter    Entrance Stairs-Number of Steps  5    Entrance Stairs-Rails  Left    [place]  Two level    Alternate Level Stairs-Number of Steps  [place] - single point      Prior Function   Level of Independence  Company secretary work    Physiological scientist at [employer]   Overall Cognitive Status  Within Functional Limits for tasks assessed      Standardized Balance Assessment   Standardized Balance Assessment  Chief Technology Officer Test   Sit to Stand  Able to stand without using hands and stabilize independently    Standing Unsupported  Able to stand safely 2 minutes    Sitting with Back Unsupported but Feet Supported on Floor or Stool  Able to sit safely and securely 2 minutes    Stand to Sit  Sits safely with minimal use of hands    Transfers  Able to transfer safely, minor use of hands    Standing Unsupported with Eyes Closed  Able to stand 10  seconds safely    Standing Unsupported with Feet Together  Needs help to attain position but  able to stand for 30 seconds with feet together    From Standing, Reach Forward with Outstretched Arm  Can reach forward >12 cm safely (5")    From Standing Position, Pick up Object from [place] to pick up shoe, needs supervision    From Standing Position, Turn to Look Behind Over each Shoulder  Turn sideways only but maintains balance    Turn 360 Degrees  Able to turn 360 degrees safely but slowly    Standing Unsupported, Alternately Place Feet on Step/Stool  Able to stand independently and safely and complete 8 steps in 20 seconds    Standing Unsupported, One Foot in Front  Able to plae foot ahead of the other independently and hold 30 seconds    Standing on One Leg  Tries to lift leg/unable to hold 3 seconds but remains standing independently    Total Score  43        SUBJECTIVE Chief complaint: Neuropathy Subjective history of current problem: Pt reports imbalance related to chemo-induced neuropathy which started 4 years ago. Neuropathy occurs in stocking distribution from knees to feet. Symptoms have been unchanged since that time. She has had one fall in the last 6 months. Denies dizziness or vertigo. No heachaches/migraines. She has a history of chronic back pain and has had a bilateral nerve ablation. Her husband passed away 4 weeks ago after contracting COVID. Directional pattern for falls: None Prior history of physical therapy for balance: None Follow-up appointment with MD: July 7th, 2021 to see neurology. Sees Dr. Holley Raring in June Red flags (bowel/bladder changes, saddle paresthesia, personal history of cancer, chills/fever, night sweats, unrelenting pain) Pt has a history of bladder incontinence which started after radiation. History of endometrial/uterine cancer. Otherwise negative for red flags   OBJECTIVE  MUSCULOSKELETAL: Tremor: Absent Bulk: Normal Tone:  Normal  Posture Flattened lower back and mild forward head/rounded shoulders  Gait Decreased self-selected speed. Decreased step length bilaterally with L foot slap noted.    Strength R/L 5/5 Hip flexion 5/5 Hip external rotation 4+/4+ Hip internal rotation 4+/4+ Hip abduction (in sitting) 4+/4+ Hip adduction (in sitting) 5/5 Knee extension 5/5 Knee flexion 4-/2+ Ankle Dorsiflexion   NEUROLOGICAL:  Mental Status Patient is oriented to person, place and time.  Recent memory is intact.  Remote memory is intact.  Attention span and concentration are intact.  Expressive speech is intact.  Patient's fund of knowledge is within normal limits for educational level.   Sensation Grossly intact to light touch bilateral LEs as determined by testing dermatomes L2-S2 respectively although pt has a history of BLE neuropathy. Did not perform formal monofilament testing Proprioception and hot/cold testing deferred on this date  Reflexes Deferred  Coordination/Cerebellar Deferred   FUNCTIONAL OUTCOME MEASURES   Results Comments  BERG 43/56 Fall risk, in need of intervention  TUG 11.3 seconds WNL  5TSTS 11.4 seconds WNL  10 Meter Gait Speed Self-selected: 14.3s = 0.70 m/s; Fastest: 9.7s = 1.03 m/s Self-selected speed is below normative values for full community ambulation  ABC Scale 35% Below cut-off, very low balance confidence       Objective measurements completed on examination: See above findings.              PT Education - 06/25/19 0813    Education Details  Plan of care    Person(s) Educated  Patient    Methods  Explanation    Comprehension  Verbalized understanding       PT Short Term  Goals - 06/25/19 0817      PT SHORT TERM GOAL #1   Title  Pt will be independent with home exercise program in order to improve balance and leg strength to decrease fall risk and improve function at home    Time  6    Period  Weeks    Status  New    Target Date   08/06/19        PT Long Term Goals - 06/25/19 0818      PT LONG TERM GOAL #1   Title  Pt will improve BERG by at least 3 points in order to demonstrate clinically significant improvement in balance.    Baseline  06/24/19: 43/56    Time  12    Period  Weeks    Status  New    Target Date  09/16/19      PT LONG TERM GOAL #2   Title  Pt will improve ABC by at least 13% in order to demonstrate clinically significant improvement in balance confidence.    Baseline  06/24/19: 35%    Time  12    Period  Weeks    Status  New    Target Date  09/16/19      PT LONG TERM GOAL #3   Title  Pt will increase self-selected 10MWT by at least 0.13 m/s in order to demonstrate clinically significant improvement in community ambulation.    Baseline  06/24/19: 14.3s = 0.70 m/s    Time  12    Period  Weeks    Status  New    Target Date  09/16/19             Plan - 06/25/19 0814    Clinical Impression Statement  Pt is a pleasant 85 year-old female referred for difficulty with balance related to bilateral LE neuropathy. Pt reports that this has been ongoing for the last 4 years due to chemotherapy for uterine cancer. On an unrelated note she is recently widowed as her husband passed away four weeks ago after contracting COVID. PT examination reveals deficits balance as identified by BERG of 43/56 and self-selected gait speed of 0.70 m/s. She also has very low balance confidence scoring 35% on ABC. She has L foot drop with notable slap during ambulation. She fell last fall and states that she believes that the cause for her fall was catching her L foot. It is likely that she would benefit from an AFO for her L ankle. Pt presents with deficits in strength, gait and balance. She will benefit from skilled PT services to address these deficits and decrease risk for future falls as well as improve her function at home.    Personal Factors and Comorbidities  Age;Comorbidity 3+;Past/Current Experience;Time since  onset of injury/illness/exacerbation    Comorbidities  Neuropathy, chronic back pain, MI, OA, CAD, HTN    Examination-Activity Limitations  Bend;Locomotion Level;Lift;Stairs;Stand    Examination-Participation Restrictions  Community Activity;Laundry;Yard Work    Stability/Clinical Decision Making  Unstable/Unpredictable    International aid/development worker    Rehab Potential  Fair    PT Frequency  2x / week    PT Duration  12 weeks    PT Treatment/Interventions  ADLs/Self Care Home Management;Aquatic Therapy;Biofeedback;Canalith Repostioning;Cryotherapy;Electrical Stimulation;Iontophoresis 4mg /ml Dexamethasone;Moist Heat;Traction;Ultrasound;DME Instruction;Gait training;Stair training;Functional mobility training;Therapeutic activities;Therapeutic exercise;Balance training;Neuromuscular re-education;Cognitive remediation;Patient/family education;Manual techniques;Passive range of motion;Dry needling;Vestibular;Spinal Manipulations;Joint Manipulations    PT Next Visit Plan  Initiate strength and balance exercises during session as  well as HEP    PT Home Exercise Plan  None currently    Consulted and Agree with Plan of Care  Patient       Patient will benefit from skilled therapeutic intervention in order to improve the following deficits and impairments:  Abnormal gait, Decreased activity tolerance, Decreased balance, Decreased mobility, Decreased strength, Difficulty walking, Impaired sensation, Pain  Visit Diagnosis: Unsteadiness on feet  Muscle weakness (generalized)     Problem List Patient Active Problem List   Diagnosis Date Noted  . Chemotherapy-induced neuropathy ([place]) 04/17/2018  . Neuropathy 04/17/2018  . Chemotherapy-induced peripheral neuropathy ([place]) 03/01/2018  . Persistent atrial fibrillation ([place])   . Port-A-Cath in place 03/28/2017  . Bradycardia 01/18/2017  . Obesity (BMI 30-39.9) 01/17/2017  . Pulmonary nodules 12/05/2016  . Compression fracture of L5 vertebra with  delayed healing 11/01/2016  . Sacral insufficiency fracture with delayed healing 11/01/2016  . Sacral insufficiency fracture 09/05/2016  . Absence of bladder continence 07/21/2015  . Neuropathy associated with cancer ([place]) 06/02/2015  . Carcinoma of endometrium ([place]) 04/02/2015  . Gastro-esophageal reflux disease without esophagitis 02/15/2015  . Osteopenia 08/17/2014  . Hyperlipidemia 02/13/2014  . Kidney stones 10/27/2013  . CA skin, basal cell 06/30/2013  . Lumbar canal stenosis 06/30/2013  . Arthritis, degenerative 06/30/2013  . Neuralgia neuritis, sciatic nerve 06/30/2013  . Coronary artery disease 10/25/2012  . Dyslipidemia 10/25/2012  . NSVT (nonsustained ventricular tachycardia) ([place]) 10/25/2012  . Sick sinus syndrome ([place]) 10/25/2012  . Ischemic cardiomyopathy 10/22/2012  . Old anterior wall myocardial infarction 10/20/2012  . Hypertensive heart disease without CHF 10/20/2012   Lyndel Safe Thelmer Legler PT, DPT, GCS  Zanaria Morell 06/25/2019, 8:29 AM  [place] MAIN [facility] SERVICES [address] [place], [place], [address] Phone: [phone]   Fax:  [phone]  Name: LONNISHA ALOISE MRN: [id number] Date of Birth: 1934/04/23

## 2019-06-25 ENCOUNTER — Ambulatory Visit
Admission: RE | Admit: 2019-06-25 | Discharge: 2019-06-25 | Disposition: A | Discharge status: Home / Self Care | Payer: Medicare PPO | Source: Ambulatory Visit | Attending: Family Medicine | Admitting: Family Medicine

## 2019-06-25 ENCOUNTER — Other Ambulatory Visit: Payer: Self-pay | Admitting: Cardiovascular Disease

## 2019-06-25 DIAGNOSIS — Z1231 Encounter for screening mammogram for malignant neoplasm of breast: Secondary | ICD-10-CM

## 2019-06-26 DIAGNOSIS — R262 Difficulty in walking, not elsewhere classified: Secondary | ICD-10-CM | POA: Insufficient documentation

## 2019-07-01 ENCOUNTER — Ambulatory Visit: Payer: Medicare PPO | Attending: Neurology

## 2019-07-01 ENCOUNTER — Other Ambulatory Visit: Payer: Self-pay

## 2019-07-01 DIAGNOSIS — M6281 Muscle weakness (generalized): Secondary | ICD-10-CM | POA: Diagnosis present

## 2019-07-01 DIAGNOSIS — R2681 Unsteadiness on feet: Secondary | ICD-10-CM | POA: Diagnosis present

## 2019-07-01 NOTE — Therapy (Signed)
[place] MAIN [facility] SERVICES [address] [place], [place], [address] Phone: [phone]   Fax:  [phone]  Physical Therapy Treatment  Patient Details  Name: Susan [name] MRN: [id number] Date of Birth: [birth year] Referring Provider (PT): Dr. Melrose Nakayama   Encounter Date: 07/01/2019  PT End of Session - 07/01/19 1441    Visit Number  2    Number of Visits  25    Date for PT Re-Evaluation  09/16/19    Authorization Type  eval 06/24/19    PT Start Time  1435    PT Stop Time  1515    PT Time Calculation (min)  40 min    Equipment Utilized During Treatment  Gait belt    Activity Tolerance  Patient tolerated treatment well    Behavior During Therapy  [facility] for tasks assessed/performed       Past Medical History:  Diagnosis Date  . Bradycardia   . Chronic back pain   . Coronary artery disease    a. s/p STEMI in 2014 with DES to LAD with staged PCI/DES placement to LCx  . Echoencephalogram abnormality   . Heart murmur   . History of Holter monitoring   . Hyperlipidemia   . Hypertension   . Myocardial infarction ([place])   . Neuromuscular disorder (HCC)    neuropathy  . NSVT (nonsustained ventricular tachycardia) ([place])   . Personal history of chemotherapy    ENDOMETRIAL CA  . Personal history of radiation therapy    ENDOMETRIAL CA  . Uterine cancer ([place])    serrous    Past Surgical History:  Procedure Laterality Date  . ABDOMINAL HYSTERECTOMY    . BREAST EXCISIONAL BIOPSY Right 1956   NEG  . CARDIOVERSION N/A 07/27/2017   Procedure: CARDIOVERSION;  Surgeon: Skeet Latch, MD;  Location: [place];  Service: Cardiovascular;  Laterality: N/A;  . LEFT HEART CATHETERIZATION WITH CORONARY ANGIOGRAM  10/19/2012   Procedure: LEFT HEART CATHETERIZATION WITH CORONARY ANGIOGRAM;  Surgeon: Troy Sine, MD;  Location: [facility] CATH LAB;  Service: Cardiovascular;;  . PERCUTANEOUS CORONARY STENT INTERVENTION (PCI-S)  10/19/2012   Procedure:  PERCUTANEOUS CORONARY STENT INTERVENTION (PCI-S);  Surgeon: Troy Sine, MD;  Location: [facility] CATH LAB;  Service: Cardiovascular;;  . PERCUTANEOUS CORONARY STENT INTERVENTION (PCI-S) N/A 10/23/2012   Procedure: PERCUTANEOUS CORONARY STENT INTERVENTION (PCI-S);  Surgeon: Troy Sine, MD;  Location: [facility] CATH LAB;  Service: Cardiovascular;  Laterality: N/A;  . PERIPHERAL VASCULAR CATHETERIZATION N/A 05/05/2015   Procedure: Glori Luis Cath Insertion;  Surgeon: Algernon Huxley, MD;  Location: [place] CV LAB;  Service: Cardiovascular;  Laterality: N/A;  . PORTA CATH REMOVAL N/A 04/05/2017   Procedure: PORTA CATH REMOVAL;  Surgeon: Algernon Huxley, MD;  Location: [place] CV LAB;  Service: Cardiovascular;  Laterality: N/A;    There were no vitals filed for this visit.  Subjective Assessment - 07/01/19 1438    Subjective  Pt reports that she is doing well today. She is having some chronic low back pain today that she rates as 5/10. No falls since last therapy session. No specific questions or concerns currently.    Pertinent History  Pt reports imbalance related to chemo-induced neuropathy which started 4 years ago. Neuropathy occurs in stocking distribution from knees to feet. Symptoms have been unchanged since that time. She has had one fall in the last 6 months. Denies dizziness or vertigo. No heachaches/migraines. She has a history of chronic back pain and has  had a bilateral nerve ablation. Her husband passed away 4 weeks ago after contracting COVID.    How long can you stand comfortably?  5 minutes    How long can you walk comfortably?  5 or 10 minutes    Patient Stated Goals  Strengthen her back muscles to decrease pain and improve her balance    Currently in Pain?  Yes    Pain Score  5     Pain Location  Back    Pain Orientation  --   Lower back at center (near spine)   Pain Descriptors / Indicators  Stabbing    Pain Type  Chronic pain    Pain Onset  More than a month ago    Pain Frequency   Constant          TREATMENT   Ther-ex  Nustep L1/2 x 5 min for warm-up during history (unbilled) Sit to stand x 10 without UE support, with Airex pad under feet x 10; Standing exercises with 3# ankle weights bilateral; Marching x 15; Hip abduction x 15; HS curls x 15; Hip extension x 15;  Seated marches with green tband 3s hold x 10; Seated clams with green tband 3s hold x 10;   Neuromuscular Re-education  Airex NBOS eye open/closed x 30s each; Airex NBOS horizontal and vertical head turns x 30s each; Airex NBOS ball passes around body with therapist adjusting ball between waist and overhead as well as varying distance to challenge forward reach x multiple bouts on each side; 1/2 foam roll static balance x 30s; 1/2 foam roll heel/toe rocking x 30s; 1/2 foam roll tandem balance alternating forward LE x 30s each;   Pt educated throughout session about proper posture and technique with exercises. Improved exercise technique, movement at target joints, use of target muscles after min to mod verbal, visual, tactile cues.    Pt demonstrates excellent motivation during session today. She reports some chronic low back pain but it doesn't worsen during therapy session. She is able to complete all exercises as instructed with intermittent seated rest breaks due to fatigue. She demonstrates difficulty with balance with eyes closed as well as with head turns. Issued HEP today for balance and strengthening and performed all exercises during session. Pt encouraged to follow-up as scheduled. Pt will benefit from PT services to address deficits in strength, balance, and mobility in order to return to full function at home.                         PT Short Term Goals - 06/25/19 0817      PT SHORT TERM GOAL #1   Title  Pt will be independent with home exercise program in order to improve balance and leg strength to decrease fall risk and improve function at home    Time   6    Period  Weeks    Status  New    Target Date  08/06/19        PT Long Term Goals - 06/25/19 0818      PT LONG TERM GOAL #1   Title  Pt will improve BERG by at least 3 points in order to demonstrate clinically significant improvement in balance.    Baseline  06/24/19: 43/56    Time  12    Period  Weeks    Status  New    Target Date  09/16/19      PT LONG TERM GOAL #  2   Title  Pt will improve ABC by at least 13% in order to demonstrate clinically significant improvement in balance confidence.    Baseline  06/24/19: 35%    Time  12    Period  Weeks    Status  New    Target Date  09/16/19      PT LONG TERM GOAL #3   Title  Pt will increase self-selected 10MWT by at least 0.13 m/s in order to demonstrate clinically significant improvement in community ambulation.    Baseline  06/24/19: 14.3s = 0.70 m/s    Time  12    Period  Weeks    Status  New    Target Date  09/16/19            Plan - 07/01/19 1442    Clinical Impression Statement  Pt demonstrates excellent motivation during session today. She reports some chronic low back pain but it doesn't worsen during therapy session. She is able to complete all exercises as instructed with intermittent seated rest breaks due to fatigue. She demonstrates difficulty with balance with eyes closed as well as with head turns. Issued HEP today for balance and strengthening and performed all exercises during session. Pt encouraged to follow-up as scheduled. Pt will benefit from PT services to address deficits in strength, balance, and mobility in order to return to full function at home.    Personal Factors and Comorbidities  Age;Comorbidity 3+;Past/Current Experience;Time since onset of injury/illness/exacerbation    Comorbidities  Neuropathy, chronic back pain, MI, OA, CAD, HTN    Examination-Activity Limitations  Bend;Locomotion Level;Lift;Stairs;Stand    Examination-Participation Restrictions  Community Activity;Laundry;Yard Work     Stability/Clinical Decision Making  Unstable/Unpredictable    Rehab Potential  Fair    PT Frequency  2x / week    PT Duration  12 weeks    PT Treatment/Interventions  ADLs/Self Care Home Management;Aquatic Therapy;Biofeedback;Canalith Repostioning;Cryotherapy;Electrical Stimulation;Iontophoresis 4mg /ml Dexamethasone;Moist Heat;Traction;Ultrasound;DME Instruction;Gait training;Stair training;Functional mobility training;Therapeutic activities;Therapeutic exercise;Balance training;Neuromuscular re-education;Cognitive remediation;Patient/family education;Manual techniques;Passive range of motion;Dry needling;Vestibular;Spinal Manipulations;Joint Manipulations    PT Next Visit Plan  Initiate strength and balance exercises during session as well as HEP    PT Home Exercise Plan  None currently    Consulted and Agree with Plan of Care  Patient       Patient will benefit from skilled therapeutic intervention in order to improve the following deficits and impairments:  Abnormal gait, Decreased activity tolerance, Decreased balance, Decreased mobility, Decreased strength, Difficulty walking, Impaired sensation, Pain  Visit Diagnosis: Unsteadiness on feet  Muscle weakness (generalized)     Problem List Patient Active Problem List   Diagnosis Date Noted  . Chemotherapy-induced neuropathy ([place]) 04/17/2018  . Neuropathy 04/17/2018  . Chemotherapy-induced peripheral neuropathy ([place]) 03/01/2018  . Persistent atrial fibrillation ([place])   . Port-A-Cath in place 03/28/2017  . Bradycardia 01/18/2017  . Obesity (BMI 30-39.9) 01/17/2017  . Pulmonary nodules 12/05/2016  . Compression fracture of L5 vertebra with delayed healing 11/01/2016  . Sacral insufficiency fracture with delayed healing 11/01/2016  . Sacral insufficiency fracture 09/05/2016  . Absence of bladder continence 07/21/2015  . Neuropathy associated with cancer ([place]) 06/02/2015  . Carcinoma of endometrium ([place]) 04/02/2015  .  Gastro-esophageal reflux disease without esophagitis 02/15/2015  . Osteopenia 08/17/2014  . Hyperlipidemia 02/13/2014  . Kidney stones 10/27/2013  . CA skin, basal cell 06/30/2013  . Lumbar canal stenosis 06/30/2013  . Arthritis, degenerative 06/30/2013  . Neuralgia neuritis, sciatic nerve 06/30/2013  . Coronary  artery disease 10/25/2012  . Dyslipidemia 10/25/2012  . NSVT (nonsustained ventricular tachycardia) ([place]) 10/25/2012  . Sick sinus syndrome ([place]) 10/25/2012  . Ischemic cardiomyopathy 10/22/2012  . Old anterior wall myocardial infarction 10/20/2012  . Hypertensive heart disease without CHF 10/20/2012   Lyndel Safe Mattingly Fountaine PT, DPT, GCS  Jarom Govan 07/02/2019, 2:54 PM  [place] MAIN [facility] SERVICES [address] [place], [place], [address] Phone: [phone]   Fax:  [phone]  Name: LEYDY WHOOLERY MRN: [id number] Date of Birth: [birth year]

## 2019-07-01 NOTE — Patient Instructions (Signed)
Access Code: [email or web address] URL: [email or web address] Date: 07/01/2019 Prepared by: Roxana Hires  Exercises Romberg Stance with Head Rotation - 1 x daily - 7 x weekly - 3 reps - 30s hold Romberg Stance with Head Nods - 1 x daily - 7 x weekly - 3 reps - 30s hold Romberg Stance with Eyes Closed - 1 x daily - 7 x weekly - 3 reps - 30s hold Seated March with Resistance - 1 x daily - 7 x weekly - 2 sets - 10 reps - 3s hold Seated Hip Abduction with Resistance - 1 x daily - 7 x weekly - 2 sets - 10 reps - 3s hold Sit to Stand without Arm Support - 1 x daily - 7 x weekly - 2 sets - 10 reps

## 2019-07-03 ENCOUNTER — Other Ambulatory Visit: Payer: Self-pay

## 2019-07-03 ENCOUNTER — Ambulatory Visit: Payer: Medicare PPO

## 2019-07-03 DIAGNOSIS — R2681 Unsteadiness on feet: Secondary | ICD-10-CM | POA: Diagnosis not present

## 2019-07-03 DIAGNOSIS — M6281 Muscle weakness (generalized): Secondary | ICD-10-CM

## 2019-07-03 NOTE — Therapy (Signed)
[place] MAIN [facility] SERVICES [address] [place], [place], [address] Phone: [phone]   Fax:  [phone]  Physical Therapy Treatment  Patient Details  Name: Susan [name] MRN: [id number] Date of Birth: Nov 19, 1934 Referring Provider (PT): Dr. Melrose Nakayama   Encounter Date: 07/03/2019  PT End of Session - 07/03/19 1651    Visit Number  3    Number of Visits  25    Date for PT Re-Evaluation  09/16/19    Authorization Type  eval 06/24/19    PT Start Time  1646    PT Stop Time  1730    PT Time Calculation (min)  44 min    Equipment Utilized During Treatment  Gait belt    Activity Tolerance  Patient tolerated treatment well    Behavior During Therapy  [facility] for tasks assessed/performed       Past Medical History:  Diagnosis Date  . Bradycardia   . Chronic back pain   . Coronary artery disease    a. s/p STEMI in 2014 with DES to LAD with staged PCI/DES placement to LCx  . Echoencephalogram abnormality   . Heart murmur   . History of Holter monitoring   . Hyperlipidemia   . Hypertension   . Myocardial infarction ([place])   . Neuromuscular disorder (HCC)    neuropathy  . NSVT (nonsustained ventricular tachycardia) ([place])   . Personal history of chemotherapy    ENDOMETRIAL CA  . Personal history of radiation therapy    ENDOMETRIAL CA  . Uterine cancer ([place])    serrous    Past Surgical History:  Procedure Laterality Date  . ABDOMINAL HYSTERECTOMY    . BREAST EXCISIONAL BIOPSY Right 1956   NEG  . CARDIOVERSION N/A 07/27/2017   Procedure: CARDIOVERSION;  Surgeon: Skeet Latch, MD;  Location: [place];  Service: Cardiovascular;  Laterality: N/A;  . LEFT HEART CATHETERIZATION WITH CORONARY ANGIOGRAM  10/19/2012   Procedure: LEFT HEART CATHETERIZATION WITH CORONARY ANGIOGRAM;  Surgeon: Troy Sine, MD;  Location: [facility] CATH LAB;  Service: Cardiovascular;;  . PERCUTANEOUS CORONARY STENT INTERVENTION (PCI-S)  10/19/2012   Procedure:  PERCUTANEOUS CORONARY STENT INTERVENTION (PCI-S);  Surgeon: Troy Sine, MD;  Location: [facility] CATH LAB;  Service: Cardiovascular;;  . PERCUTANEOUS CORONARY STENT INTERVENTION (PCI-S) N/A 10/23/2012   Procedure: PERCUTANEOUS CORONARY STENT INTERVENTION (PCI-S);  Surgeon: Troy Sine, MD;  Location: [facility] CATH LAB;  Service: Cardiovascular;  Laterality: N/A;  . PERIPHERAL VASCULAR CATHETERIZATION N/A 05/05/2015   Procedure: Glori Luis Cath Insertion;  Surgeon: Algernon Huxley, MD;  Location: [place] CV LAB;  Service: Cardiovascular;  Laterality: N/A;  . PORTA CATH REMOVAL N/A 04/05/2017   Procedure: PORTA CATH REMOVAL;  Surgeon: Algernon Huxley, MD;  Location: [place] CV LAB;  Service: Cardiovascular;  Laterality: N/A;    There were no vitals filed for this visit.  Subjective Assessment - 07/03/19 1650    Subjective  Pt reports that she is doing well today. She is having some chronic low back pain today that she rates as 5/10. No falls since last therapy session. No specific questions or concerns currently.    Pertinent History  Pt reports imbalance related to chemo-induced neuropathy which started 4 years ago. Neuropathy occurs in stocking distribution from knees to feet. Symptoms have been unchanged since that time. She has had one fall in the last 6 months. Denies dizziness or vertigo. No heachaches/migraines. She has a history of chronic back pain and has  had a bilateral nerve ablation. Her husband passed away 4 weeks ago after contracting COVID.    How long can you stand comfortably?  5 minutes    How long can you walk comfortably?  5 or 10 minutes    Patient Stated Goals  Strengthen her back muscles to decrease pain and improve her balance    Currently in Pain?  Yes    Pain Score  5     Pain Location  Back    Pain Orientation  Other (Comment);Lower   Lower back near center (spine)   Pain Descriptors / Indicators  Stabbing    Pain Type  Chronic pain    Pain Onset  More than a month ago     Pain Frequency  Constant            TREATMENT   Ther-ex  Octane xRide L2 x 5 min for warm-up during history (3 minutes unbilled); Quantum leg press 55# 2 x 20; Quantum heel raises 55# 2 x 20; Sit to stand with Airex pad under feet and 2kg overhead ball press 2 x 5;  Standing exercises with 4# ankle weights bilateral; Marching x 20; Hip abduction x 20; HS curls x 20; Hip extension x 20;   Neuromuscular Re-education  6" orange hurdle forward half step-over/back x 10 each side; 6" orange hurdle lateral step-overs x 5 each direction; 1/2 foam roll static balance x 30s; 1/2 foam roll heel/toe rocking x 30s; 1/2 foam roll tandem balance alternating forward LE x 30s each;   Pt educated throughout session about proper posture and technique with exercises. Improved exercise technique, movement at target joints, use of target muscles after min to mod verbal, visual, tactile cues.    Pt demonstrates excellent motivation during session today. She had a yoga class this morning and did her HEP yesterday. Initiated leg press and heel raises today. Also incorporated overhead weight ball press with sit to stand exercise. She reports some chronic low back pain but it doesn't worsen during therapy session. She is able to complete all exercises as instructed with intermittent seated rest breaks due to fatigue. Pt encouraged to continue HEP and follow-up as scheduled. Pt will benefit from PT services to address deficits in strength, balance, and mobility in order to return to full function at home.                       PT Short Term Goals - 06/25/19 0817      PT SHORT TERM GOAL #1   Title  Pt will be independent with home exercise program in order to improve balance and leg strength to decrease fall risk and improve function at home    Time  6    Period  Weeks    Status  New    Target Date  08/06/19        PT Long Term Goals - 06/25/19 0818      PT LONG  TERM GOAL #1   Title  Pt will improve BERG by at least 3 points in order to demonstrate clinically significant improvement in balance.    Baseline  06/24/19: 43/56    Time  12    Period  Weeks    Status  New    Target Date  09/16/19      PT LONG TERM GOAL #2   Title  Pt will improve ABC by at least 13% in order to demonstrate clinically significant improvement in  balance confidence.    Baseline  06/24/19: 35%    Time  12    Period  Weeks    Status  New    Target Date  09/16/19      PT LONG TERM GOAL #3   Title  Pt will increase self-selected 10MWT by at least 0.13 m/s in order to demonstrate clinically significant improvement in community ambulation.    Baseline  06/24/19: 14.3s = 0.70 m/s    Time  12    Period  Weeks    Status  New    Target Date  09/16/19            Plan - 07/03/19 1652    Clinical Impression Statement  Pt demonstrates excellent motivation during session today. She had a yoga class this morning and did her HEP yesterday. Initiated leg press and heel raises today. Also incorporated overhead weight ball press with sit to stand exercise. She reports some chronic low back pain but it doesn't worsen during therapy session. She is able to complete all exercises as instructed with intermittent seated rest breaks due to fatigue. Pt encouraged to continue HEP and follow-up as scheduled. Pt will benefit from PT services to address deficits in strength, balance, and mobility in order to return to full function at home    Personal Factors and Comorbidities  Age;Comorbidity 3+;Past/Current Experience;Time since onset of injury/illness/exacerbation    Comorbidities  Neuropathy, chronic back pain, MI, OA, CAD, HTN    Examination-Activity Limitations  Bend;Locomotion Level;Lift;Stairs;Stand    Examination-Participation Restrictions  Community Activity;Laundry;Yard Work    Stability/Clinical Decision Making  Unstable/Unpredictable    Rehab Potential  Fair    PT Frequency  2x /  week    PT Duration  12 weeks    PT Treatment/Interventions  ADLs/Self Care Home Management;Aquatic Therapy;Biofeedback;Canalith Repostioning;Cryotherapy;Electrical Stimulation;Iontophoresis 4mg /ml Dexamethasone;Moist Heat;Traction;Ultrasound;DME Instruction;Gait training;Stair training;Functional mobility training;Therapeutic activities;Therapeutic exercise;Balance training;Neuromuscular re-education;Cognitive remediation;Patient/family education;Manual techniques;Passive range of motion;Dry needling;Vestibular;Spinal Manipulations;Joint Manipulations    PT Next Visit Plan  Initiate strength and balance exercises during session as well as HEP    PT Home Exercise Plan  None currently    Consulted and Agree with Plan of Care  Patient       Patient will benefit from skilled therapeutic intervention in order to improve the following deficits and impairments:  Abnormal gait, Decreased activity tolerance, Decreased balance, Decreased mobility, Decreased strength, Difficulty walking, Impaired sensation, Pain  Visit Diagnosis: Unsteadiness on feet  Muscle weakness (generalized)     Problem List Patient Active Problem List   Diagnosis Date Noted  . Chemotherapy-induced neuropathy ([place]) 04/17/2018  . Neuropathy 04/17/2018  . Chemotherapy-induced peripheral neuropathy ([place]) 03/01/2018  . Persistent atrial fibrillation ([place])   . Port-A-Cath in place 03/28/2017  . Bradycardia 01/18/2017  . Obesity (BMI 30-39.9) 01/17/2017  . Pulmonary nodules 12/05/2016  . Compression fracture of L5 vertebra with delayed healing 11/01/2016  . Sacral insufficiency fracture with delayed healing 11/01/2016  . Sacral insufficiency fracture 09/05/2016  . Absence of bladder continence 07/21/2015  . Neuropathy associated with cancer ([place]) 06/02/2015  . Carcinoma of endometrium ([place]) 04/02/2015  . Gastro-esophageal reflux disease without esophagitis 02/15/2015  . Osteopenia 08/17/2014  . Hyperlipidemia 02/13/2014   . Kidney stones 10/27/2013  . CA skin, basal cell 06/30/2013  . Lumbar canal stenosis 06/30/2013  . Arthritis, degenerative 06/30/2013  . Neuralgia neuritis, sciatic nerve 06/30/2013  . Coronary artery disease 10/25/2012  . Dyslipidemia 10/25/2012  . NSVT (nonsustained ventricular tachycardia) ([place]) 10/25/2012  .  Sick sinus syndrome ([place]) 10/25/2012  . Ischemic cardiomyopathy 10/22/2012  . Old anterior wall myocardial infarction 10/20/2012  . Hypertensive heart disease without CHF 10/20/2012   Lyndel Safe Kathleene Bergemann PT, DPT, GCS  Caylin Raby 07/03/2019, 5:43 PM  [place] MAIN [facility] SERVICES [address] [place], [place], [address] Phone: [phone]   Fax:  [phone]  Name: LAREINA OQUIN MRN: [id number] Date of Birth: Sep 06, 1934

## 2019-07-08 ENCOUNTER — Ambulatory Visit: Payer: Medicare PPO

## 2019-07-08 ENCOUNTER — Other Ambulatory Visit: Payer: Self-pay

## 2019-07-08 DIAGNOSIS — R2681 Unsteadiness on feet: Secondary | ICD-10-CM

## 2019-07-08 DIAGNOSIS — M6281 Muscle weakness (generalized): Secondary | ICD-10-CM

## 2019-07-08 NOTE — Therapy (Signed)
[place] MAIN [facility] SERVICES [address] [place], [place], [address] Phone: [phone]   Fax:  [phone]  Physical Therapy Treatment  Patient Details  Name: Susan [name] MRN: [id number] Date of Birth: 09-Feb-1935 Referring Provider (PT): Dr. Melrose Nakayama   Encounter Date: 07/08/2019  PT End of Session - 07/08/19 1436    Visit Number  4    Number of Visits  25    Date for PT Re-Evaluation  09/16/19    Authorization Type  eval 06/24/19    PT Start Time  1432    PT Stop Time  1515    PT Time Calculation (min)  43 min    Equipment Utilized During Treatment  Gait belt    Activity Tolerance  Patient tolerated treatment well    Behavior During Therapy  [facility] for tasks assessed/performed       Past Medical History:  Diagnosis Date  . Bradycardia   . Chronic back pain   . Coronary artery disease    a. s/p STEMI in 2014 with DES to LAD with staged PCI/DES placement to LCx  . Echoencephalogram abnormality   . Heart murmur   . History of Holter monitoring   . Hyperlipidemia   . Hypertension   . Myocardial infarction ([place])   . Neuromuscular disorder (HCC)    neuropathy  . NSVT (nonsustained ventricular tachycardia) ([place])   . Personal history of chemotherapy    ENDOMETRIAL CA  . Personal history of radiation therapy    ENDOMETRIAL CA  . Uterine cancer ([place])    serrous    Past Surgical History:  Procedure Laterality Date  . ABDOMINAL HYSTERECTOMY    . BREAST EXCISIONAL BIOPSY Right 1956   NEG  . CARDIOVERSION N/A 07/27/2017   Procedure: CARDIOVERSION;  Surgeon: Skeet Latch, MD;  Location: [place];  Service: Cardiovascular;  Laterality: N/A;  . LEFT HEART CATHETERIZATION WITH CORONARY ANGIOGRAM  10/19/2012   Procedure: LEFT HEART CATHETERIZATION WITH CORONARY ANGIOGRAM;  Surgeon: Troy Sine, MD;  Location: [facility] CATH LAB;  Service: Cardiovascular;;  . PERCUTANEOUS CORONARY STENT INTERVENTION (PCI-S)  10/19/2012   Procedure:  PERCUTANEOUS CORONARY STENT INTERVENTION (PCI-S);  Surgeon: Troy Sine, MD;  Location: [facility] CATH LAB;  Service: Cardiovascular;;  . PERCUTANEOUS CORONARY STENT INTERVENTION (PCI-S) N/A 10/23/2012   Procedure: PERCUTANEOUS CORONARY STENT INTERVENTION (PCI-S);  Surgeon: Troy Sine, MD;  Location: [facility] CATH LAB;  Service: Cardiovascular;  Laterality: N/A;  . PERIPHERAL VASCULAR CATHETERIZATION N/A 05/05/2015   Procedure: Glori Luis Cath Insertion;  Surgeon: Algernon Huxley, MD;  Location: [place] CV LAB;  Service: Cardiovascular;  Laterality: N/A;  . PORTA CATH REMOVAL N/A 04/05/2017   Procedure: PORTA CATH REMOVAL;  Surgeon: Algernon Huxley, MD;  Location: [place] CV LAB;  Service: Cardiovascular;  Laterality: N/A;    There were no vitals filed for this visit.  Subjective Assessment - 07/08/19 1435    Subjective  Pt reports that she is doing well today. She is having some chronic low back pain today that she rates as 5/10. No falls since last therapy session. No specific questions or concerns currently.    Pertinent History  Pt reports imbalance related to chemo-induced neuropathy which started 4 years ago. Neuropathy occurs in stocking distribution from knees to feet. Symptoms have been unchanged since that time. She has had one fall in the last 6 months. Denies dizziness or vertigo. No heachaches/migraines. She has a history of chronic back pain and has  had a bilateral nerve ablation. Her husband passed away 4 weeks ago after contracting COVID.    How long can you stand comfortably?  5 minutes    How long can you walk comfortably?  5 or 10 minutes    Patient Stated Goals  Strengthen her back muscles to decrease pain and improve her balance    Currently in Pain?  Yes    Pain Score  5     Pain Location  Back    Pain Orientation  Other (Comment);Lower   Lower back near center (spine)   Pain Descriptors / Indicators  Stabbing    Pain Type  Chronic pain    Pain Onset  More than a month ago            TREATMENT   Ther-ex Octane xRide L3x 5 min for warm-up during history (3 minutes unbilled); Quantum leg press 60# 2 x 20; Quantum heel raises 55# 2 x 20;  Standing exercises with 5# ankle weights bilateral; Marching x 20; Hip abduction x 20; HS curls x 20; Hip extension x 20;   Neuromuscular Re-education Airex NBOS eyes open/closed x 30s each; Airex NBOS horizontal/vertical head turns x 30s each; Staggered balance with one foot on Airex and one foot extended anteriorly on dynadisc x 30s bilateral; Tandem gait on 2"x4" x 4 lengths; Side stepping on 2"x4" x 4 lengths;   Pt educated throughout session about proper posture and technique with exercises. Improved exercise technique, movement at target joints, use of target muscles after min to mod verbal, visual, tactile cues.   Pt demonstrates excellent motivation during session today. She reports some chronic low back pain but it doesn't worsen during therapy session today. She is able to complete all exercises as instructed with intermittent seated rest breaks due to fatigue. Increased resistance of ankle weights today. Introduced tandem gait on 2"x4" which is challenging for patient. Pt encouraged to continue HEP and follow-up as scheduled.Pt will benefit from PT services to address deficits in strength, balance, and mobility in order to return to full function at home.                       PT Short Term Goals - 06/25/19 0817      PT SHORT TERM GOAL #1   Title  Pt will be independent with home exercise program in order to improve balance and leg strength to decrease fall risk and improve function at home    Time  6    Period  Weeks    Status  New    Target Date  08/06/19        PT Long Term Goals - 06/25/19 0818      PT LONG TERM GOAL #1   Title  Pt will improve BERG by at least 3 points in order to demonstrate clinically significant improvement in balance.    Baseline   06/24/19: 43/56    Time  12    Period  Weeks    Status  New    Target Date  09/16/19      PT LONG TERM GOAL #2   Title  Pt will improve ABC by at least 13% in order to demonstrate clinically significant improvement in balance confidence.    Baseline  06/24/19: 35%    Time  12    Period  Weeks    Status  New    Target Date  09/16/19      PT  LONG TERM GOAL #3   Title  Pt will increase self-selected 10MWT by at least 0.13 m/s in order to demonstrate clinically significant improvement in community ambulation.    Baseline  06/24/19: 14.3s = 0.70 m/s    Time  12    Period  Weeks    Status  New    Target Date  09/16/19            Plan - 07/08/19 1436    Clinical Impression Statement  Pt demonstrates excellent motivation during session today. She reports some chronic low back pain but it doesn't worsen during therapy session today. She is able to complete all exercises as instructed with intermittent seated rest breaks due to fatigue. Increased resistance of ankle weights today. Introduced tandem gait on 2"x4" which is challenging for patient. Pt encouraged to continue HEP and follow-up as scheduled. Pt will benefit from PT services to address deficits in strength, balance, and mobility in order to return to full function at home.    Personal Factors and Comorbidities  Age;Comorbidity 3+;Past/Current Experience;Time since onset of injury/illness/exacerbation    Comorbidities  Neuropathy, chronic back pain, MI, OA, CAD, HTN    Examination-Activity Limitations  Bend;Locomotion Level;Lift;Stairs;Stand    Examination-Participation Restrictions  Community Activity;Laundry;Yard Work    Stability/Clinical Decision Making  Unstable/Unpredictable    Rehab Potential  Fair    PT Frequency  2x / week    PT Duration  12 weeks    PT Treatment/Interventions  ADLs/Self Care Home Management;Aquatic Therapy;Biofeedback;Canalith Repostioning;Cryotherapy;Electrical Stimulation;Iontophoresis 4mg /ml  Dexamethasone;Moist Heat;Traction;Ultrasound;DME Instruction;Gait training;Stair training;Functional mobility training;Therapeutic activities;Therapeutic exercise;Balance training;Neuromuscular re-education;Cognitive remediation;Patient/family education;Manual techniques;Passive range of motion;Dry needling;Vestibular;Spinal Manipulations;Joint Manipulations    PT Next Visit Plan  Initiate strength and balance exercises during session as well as HEP    PT Home Exercise Plan  None currently    Consulted and Agree with Plan of Care  Patient       Patient will benefit from skilled therapeutic intervention in order to improve the following deficits and impairments:  Abnormal gait, Decreased activity tolerance, Decreased balance, Decreased mobility, Decreased strength, Difficulty walking, Impaired sensation, Pain  Visit Diagnosis: Unsteadiness on feet  Muscle weakness (generalized)     Problem List Patient Active Problem List   Diagnosis Date Noted  . Chemotherapy-induced neuropathy ([place]) 04/17/2018  . Neuropathy 04/17/2018  . Chemotherapy-induced peripheral neuropathy ([place]) 03/01/2018  . Persistent atrial fibrillation ([place])   . Port-A-Cath in place 03/28/2017  . Bradycardia 01/18/2017  . Obesity (BMI 30-39.9) 01/17/2017  . Pulmonary nodules 12/05/2016  . Compression fracture of L5 vertebra with delayed healing 11/01/2016  . Sacral insufficiency fracture with delayed healing 11/01/2016  . Sacral insufficiency fracture 09/05/2016  . Absence of bladder continence 07/21/2015  . Neuropathy associated with cancer ([place]) 06/02/2015  . Carcinoma of endometrium ([place]) 04/02/2015  . Gastro-esophageal reflux disease without esophagitis 02/15/2015  . Osteopenia 08/17/2014  . Hyperlipidemia 02/13/2014  . Kidney stones 10/27/2013  . CA skin, basal cell 06/30/2013  . Lumbar canal stenosis 06/30/2013  . Arthritis, degenerative 06/30/2013  . Neuralgia neuritis, sciatic nerve 06/30/2013  . Coronary  artery disease 10/25/2012  . Dyslipidemia 10/25/2012  . NSVT (nonsustained ventricular tachycardia) ([place]) 10/25/2012  . Sick sinus syndrome ([place]) 10/25/2012  . Ischemic cardiomyopathy 10/22/2012  . Old anterior wall myocardial infarction 10/20/2012  . Hypertensive heart disease without CHF 10/20/2012   Lyndel Safe Jaiden Wahab PT, DPT, GCS  Gaylin Osoria 07/08/2019, 4:13 PM  [place] MAIN [facility] SERVICES [address]  [place], [place], [address] Phone: [phone]   Fax:  [phone]  Name: ACSA ARANGO MRN: [id number] Date of Birth: 04/09/1934

## 2019-07-10 ENCOUNTER — Ambulatory Visit: Payer: Medicare PPO

## 2019-07-10 ENCOUNTER — Other Ambulatory Visit: Payer: Self-pay

## 2019-07-10 DIAGNOSIS — R2681 Unsteadiness on feet: Secondary | ICD-10-CM | POA: Diagnosis not present

## 2019-07-10 DIAGNOSIS — M6281 Muscle weakness (generalized): Secondary | ICD-10-CM

## 2019-07-10 NOTE — Therapy (Signed)
[place] MAIN [facility] SERVICES [address] [place], [place], [address] Phone: [phone]   Fax:  [phone]  Physical Therapy Treatment  Patient Details  Name: Susan [name] MRN: [id number] Date of Birth: 06-22-1934 Referring Provider (PT): Dr. Melrose Nakayama   Encounter Date: 07/10/2019  PT End of Session - 07/10/19 1005    Visit Number  5    Number of Visits  25    Date for PT Re-Evaluation  09/16/19    Authorization Type  eval 06/24/19    PT Start Time  0930    PT Stop Time  1015    PT Time Calculation (min)  45 min    Equipment Utilized During Treatment  Gait belt    Activity Tolerance  Patient tolerated treatment well    Behavior During Therapy  [facility] for tasks assessed/performed       Past Medical History:  Diagnosis Date  . Bradycardia   . Chronic back pain   . Coronary artery disease    a. s/p STEMI in 2014 with DES to LAD with staged PCI/DES placement to LCx  . Echoencephalogram abnormality   . Heart murmur   . History of Holter monitoring   . Hyperlipidemia   . Hypertension   . Myocardial infarction ([place])   . Neuromuscular disorder (HCC)    neuropathy  . NSVT (nonsustained ventricular tachycardia) ([place])   . Personal history of chemotherapy    ENDOMETRIAL CA  . Personal history of radiation therapy    ENDOMETRIAL CA  . Uterine cancer ([place])    serrous    Past Surgical History:  Procedure Laterality Date  . ABDOMINAL HYSTERECTOMY    . BREAST EXCISIONAL BIOPSY Right 1956   NEG  . CARDIOVERSION N/A 07/27/2017   Procedure: CARDIOVERSION;  Surgeon: Skeet Latch, MD;  Location: [place];  Service: Cardiovascular;  Laterality: N/A;  . LEFT HEART CATHETERIZATION WITH CORONARY ANGIOGRAM  10/19/2012   Procedure: LEFT HEART CATHETERIZATION WITH CORONARY ANGIOGRAM;  Surgeon: Troy Sine, MD;  Location[facility] CATH LAB;  Service: Cardiovascular;;  . PERCUTANEOUS CORONARY STENT INTERVENTION (PCI-S)  10/19/2012   Procedure:  PERCUTANEOUS CORONARY STENT INTERVENTION (PCI-S);  Surgeon: Troy Sine, MD;  Location: [name] CATH LAB;  Service: Cardiovascular;;  . PERCUTANEOUS CORONARY STENT INTERVENTION (PCI-S) N/A 10/23/2012   Procedure: PERCUTANEOUS CORONARY STENT INTERVENTION (PCI-S);  Surgeon: Troy Sine, MD;  Location: [facility] CATH LAB;  Service: Cardiovascular;  Laterality: N/A;  . PERIPHERAL VASCULAR CATHETERIZATION N/A 05/05/2015   Procedure: Glori Luis Cath Insertion;  Surgeon: Algernon Huxley, MD;  Location: [place] CV LAB;  Service: Cardiovascular;  Laterality: N/A;  . PORTA CATH REMOVAL N/A 04/05/2017   Procedure: PORTA CATH REMOVAL;  Surgeon: Algernon Huxley, MD;  Location: [place] CV LAB;  Service: Cardiovascular;  Laterality: N/A;    There were no vitals filed for this visit.  Subjective Assessment - 07/10/19 0929    Subjective  Pt reports that she is doing well today. She is having some chronic low back pain today that she rates as 5/10. No falls since last therapy session. No specific questions or concerns currently.    Pertinent History  Pt reports imbalance related to chemo-induced neuropathy which started 4 years ago. Neuropathy occurs in stocking distribution from knees to feet. Symptoms have been unchanged since that time. She has had one fall in the last 6 months. Denies dizziness or vertigo. No heachaches/migraines. She has a history of chronic back pain and has  had a bilateral nerve ablation. Her husband passed away 4 weeks ago after contracting COVID.    How long can you stand comfortably?  5 minutes    How long can you walk comfortably?  5 or 10 minutes    Patient Stated Goals  Strengthen her back muscles to decrease pain and improve her balance    Currently in Pain?  Yes    Pain Score  5     Pain Location  Back    Pain Orientation  Other (Comment)   Lower back near center (spine)   Pain Descriptors / Indicators  Stabbing    Pain Type  Chronic pain    Pain Onset  More than a month ago          TREATMENT   Ther-ex NuStep L2 x 5 minutes for warm-up during history (3 minutes unbilled); Quantum leg press 60# x 20, 70# x 20; Quantum heel raises 60# x 20, 70# x 20;  Standing exercises with5# ankle weights bilateral; Marching x20; Hip abduction x20; HS curls x20; Hip extension x20;   Neuromuscular Re-education Rockerboard balance A/P and R/L orientation x 30s each; Rockerboard balance A/P and R/L orientation with horizontal head turns x 30s each; Rockerboard balance A/P and R/L orientation with heel/toe rocking and R/L weight shifts respectively x 30s each;   Pt educated throughout session about proper posture and technique with exercises. Improved exercise technique, movement at target joints, use of target muscles after min to mod verbal, visual, tactile cues.   Pt demonstrates excellent motivation during session today.She reports some chronic low back pain but it doesn't worsen during therapy session today. She is able to complete all exercises as instructed with intermittent seated rest breaks due to fatigue. Utilized rockerboard for balance training and pt requires CGA/minA+1 throughout. She is able to increase her resistance on the leg press today. Pt encouraged tocontinue HEP andfollow-up as scheduled.Pt will benefit from PT services to address deficits in strength, balance, and mobility in order to return to full function at home.                         PT Short Term Goals - 06/25/19 0817      PT SHORT TERM GOAL #1   Title  Pt will be independent with home exercise program in order to improve balance and leg strength to decrease fall risk and improve function at home    Time  6    Period  Weeks    Status  New    Target Date  08/06/19        PT Long Term Goals - 06/25/19 0818      PT LONG TERM GOAL #1   Title  Pt will improve BERG by at least 3 points in order to demonstrate clinically significant improvement in  balance.    Baseline  06/24/19: 43/56    Time  12    Period  Weeks    Status  New    Target Date  09/16/19      PT LONG TERM GOAL #2   Title  Pt will improve ABC by at least 13% in order to demonstrate clinically significant improvement in balance confidence.    Baseline  06/24/19: 35%    Time  12    Period  Weeks    Status  New    Target Date  09/16/19      PT LONG TERM GOAL #3  Title  Pt will increase self-selected 10MWT by at least 0.13 m/s in order to demonstrate clinically significant improvement in community ambulation.    Baseline  06/24/19: 14.3s = 0.70 m/s    Time  12    Period  Weeks    Status  New    Target Date  09/16/19            Plan - 07/10/19 1005    Clinical Impression Statement  Pt demonstrates excellent motivation during session today. She reports some chronic low back pain but it doesn't worsen during therapy session today. She is able to complete all exercises as instructed with intermittent seated rest breaks due to fatigue. Utilized rockerboard for balance training and pt requires CGA/minA+1 throughout. She is able to increase her resistance on the leg press today. Pt encouraged to continue HEP and follow-up as scheduled. Pt will benefit from PT services to address deficits in strength, balance, and mobility in order to return to full function at home.    Personal Factors and Comorbidities  Age;Comorbidity 3+;Past/Current Experience;Time since onset of injury/illness/exacerbation    Comorbidities  Neuropathy, chronic back pain, MI, OA, CAD, HTN    Examination-Activity Limitations  Bend;Locomotion Level;Lift;Stairs;Stand    Examination-Participation Restrictions  Community Activity;Laundry;Yard Work    Stability/Clinical Decision Making  Unstable/Unpredictable    Rehab Potential  Fair    PT Frequency  2x / week    PT Duration  12 weeks    PT Treatment/Interventions  ADLs/Self Care Home Management;Aquatic Therapy;Biofeedback;Canalith  Repostioning;Cryotherapy;Electrical Stimulation;Iontophoresis 4mg /ml Dexamethasone;Moist Heat;Traction;Ultrasound;DME Instruction;Gait training;Stair training;Functional mobility training;Therapeutic activities;Therapeutic exercise;Balance training;Neuromuscular re-education;Cognitive remediation;Patient/family education;Manual techniques;Passive range of motion;Dry needling;Vestibular;Spinal Manipulations;Joint Manipulations    PT Next Visit Plan  Initiate strength and balance exercises during session as well as HEP    PT Home Exercise Plan  None currently    Consulted and Agree with Plan of Care  Patient       Patient will benefit from skilled therapeutic intervention in order to improve the following deficits and impairments:  Abnormal gait, Decreased activity tolerance, Decreased balance, Decreased mobility, Decreased strength, Difficulty walking, Impaired sensation, Pain  Visit Diagnosis: Unsteadiness on feet  Muscle weakness (generalized)     Problem List Patient Active Problem List   Diagnosis Date Noted  . Chemotherapy-induced neuropathy ([place]) 04/17/2018  . Neuropathy 04/17/2018  . Chemotherapy-induced peripheral neuropathy ([place]) 03/01/2018  . Persistent atrial fibrillation ([place])   . Port-A-Cath in place 03/28/2017  . Bradycardia 01/18/2017  . Obesity (BMI 30-39.9) 01/17/2017  . Pulmonary nodules 12/05/2016  . Compression fracture of L5 vertebra with delayed healing 11/01/2016  . Sacral insufficiency fracture with delayed healing 11/01/2016  . Sacral insufficiency fracture 09/05/2016  . Absence of bladder continence 07/21/2015  . Neuropathy associated with cancer ([place]) 06/02/2015  . Carcinoma of endometrium ([place]) 04/02/2015  . Gastro-esophageal reflux disease without esophagitis 02/15/2015  . Osteopenia 08/17/2014  . Hyperlipidemia 02/13/2014  . Kidney stones 10/27/2013  . CA skin, basal cell 06/30/2013  . Lumbar canal stenosis 06/30/2013  . Arthritis, degenerative  06/30/2013  . Neuralgia neuritis, sciatic nerve 06/30/2013  . Coronary artery disease 10/25/2012  . Dyslipidemia 10/25/2012  . NSVT (nonsustained ventricular tachycardia) ([place]) 10/25/2012  . Sick sinus syndrome ([place]) 10/25/2012  . Ischemic cardiomyopathy 10/22/2012  . Old anterior wall myocardial infarction 10/20/2012  . Hypertensive heart disease without CHF 10/20/2012   Lyndel Safe  PT, DPT, GCS  , 07/10/2019, 10:41 AM  [place] MAIN [facility] SERVICES [place]  [place], [place], [address] Phone: [phone]   Fax:  [phone]  Name: Susan [name] MRN: [id number] Date of Birth: 04/07/1934

## 2019-07-11 ENCOUNTER — Other Ambulatory Visit: Payer: Self-pay | Admitting: Cardiovascular Disease

## 2019-07-11 MED ORDER — ISOSORBIDE MONONITRATE ER 30 MG PO TB24: 30.0000 mg | ORAL_TABLET | Freq: Every day | ORAL | 3 refills | Status: DC

## 2019-07-11 NOTE — Telephone Encounter (Signed)
*  STAT* If patient is at the pharmacy, call can be transferred to refill team.   1. Which medications need to be refilled? (please list name of each medication and dose if known)   isosorbide mononitrate (IMDUR) 30 MG 24 hr tablet     2. Which pharmacy/location (including street and city if local pharmacy) is medication to be sent to?  [place], [place]  3. Do they need a 30 day or 90 day supply? 90  Patient says her bottle has no refills and to make an appt with Dr. Claiborne Billings for further refills. She had an appt with Dr. Claiborne Billings 05-29-19. The patient wanted to know if that visit would fulfill the appointment requirements

## 2019-07-15 ENCOUNTER — Ambulatory Visit: Payer: Medicare PPO

## 2019-07-15 ENCOUNTER — Other Ambulatory Visit: Payer: Self-pay

## 2019-07-15 DIAGNOSIS — R2681 Unsteadiness on feet: Secondary | ICD-10-CM | POA: Diagnosis not present

## 2019-07-15 DIAGNOSIS — M6281 Muscle weakness (generalized): Secondary | ICD-10-CM

## 2019-07-15 NOTE — Therapy (Signed)
[place] MAIN [facility] SERVICES [address] [place], [place], [address] Phone: [phone]   Fax:  [phone]  Physical Therapy Treatment  Patient Details  Name: Susan [name] MRN: [id number] Date of Birth: 1935-02-07 Referring Provider (PT): Dr. Melrose Nakayama   Encounter Date: 07/15/2019  PT End of Session - 07/15/19 1429    Visit Number  6    Number of Visits  25    Date for PT Re-Evaluation  09/16/19    Authorization Type  eval 06/24/19    PT Start Time  1430    PT Stop Time  1515    PT Time Calculation (min)  45 min    Equipment Utilized During Treatment  Gait belt    Activity Tolerance  Patient tolerated treatment well    Behavior During Therapy  [facility] for tasks assessed/performed       Past Medical History:  Diagnosis Date  . Bradycardia   . Chronic back pain   . Coronary artery disease    a. s/p STEMI in 2014 with DES to LAD with staged PCI/DES placement to LCx  . Echoencephalogram abnormality   . Heart murmur   . History of Holter monitoring   . Hyperlipidemia   . Hypertension   . Myocardial infarction ([place])   . Neuromuscular disorder (HCC)    neuropathy  . NSVT (nonsustained ventricular tachycardia) ([place])   . Personal history of chemotherapy    ENDOMETRIAL CA  . Personal history of radiation therapy    ENDOMETRIAL CA  . Uterine cancer ([place])    serrous    Past Surgical History:  Procedure Laterality Date  . ABDOMINAL HYSTERECTOMY    . BREAST EXCISIONAL BIOPSY Right 1956   NEG  . CARDIOVERSION N/A 07/27/2017   Procedure: CARDIOVERSION;  Surgeon: Skeet Latch, MD;  Location: [place];  Service: Cardiovascular;  Laterality: N/A;  . LEFT HEART CATHETERIZATION WITH CORONARY ANGIOGRAM  10/19/2012   Procedure: LEFT HEART CATHETERIZATION WITH CORONARY ANGIOGRAM;  Surgeon: Troy Sine, MD;  Location: [facility] CATH LAB;  Service: Cardiovascular;;  . PERCUTANEOUS CORONARY STENT INTERVENTION (PCI-S)  10/19/2012   Procedure:  PERCUTANEOUS CORONARY STENT INTERVENTION (PCI-S);  Surgeon: Troy Sine, MD;  Location: [facility] CATH LAB;  Service: Cardiovascular;;  . PERCUTANEOUS CORONARY STENT INTERVENTION (PCI-S) N/A 10/23/2012   Procedure: PERCUTANEOUS CORONARY STENT INTERVENTION (PCI-S);  Surgeon: Troy Sine, MD;  Location[facility] CATH LAB;  Service: Cardiovascular;  Laterality: N/A;  . PERIPHERAL VASCULAR CATHETERIZATION N/A 05/05/2015   Procedure: Glori Luis Cath Insertion;  Surgeon: Algernon Huxley, MD;  Location: [place] CV LAB;  Service: Cardiovascular;  Laterality: N/A;  . PORTA CATH REMOVAL N/A 04/05/2017   Procedure: PORTA CATH REMOVAL;  Surgeon: Algernon Huxley, MD;  Location: [place] CV LAB;  Service: Cardiovascular;  Laterality: N/A;    There were no vitals filed for this visit.  Subjective Assessment - 07/15/19 1428    Subjective  Pt reports that she is doing well today. She continues with chronic low back pain today that she rates as 5/10. No falls since last therapy session. No specific questions or concerns currently. She went to [facility] yesterday for a procedure for her urinary incontinence but it was not effective.    Pertinent History  Pt reports imbalance related to chemo-induced neuropathy which started 4 years ago. Neuropathy occurs in stocking distribution from knees to feet. Symptoms have been unchanged since that time. She has had one fall in the last 6 months.  Denies dizziness or vertigo. No heachaches/migraines. She has a history of chronic back pain and has had a bilateral nerve ablation. Her husband passed away 4 weeks ago after contracting COVID.    How long can you stand comfortably?  5 minutes    How long can you walk comfortably?  5 or 10 minutes    Patient Stated Goals  Strengthen her back muscles to decrease pain and improve her balance    Currently in Pain?  Yes    Pain Score  5     Pain Location  Back    Pain Orientation  Other (Comment)   Lower back near center (spine)   Pain Descriptors /  Indicators  Stabbing    Pain Type  Chronic pain    Pain Radiating Towards  None    Pain Onset  More than a month ago    Pain Frequency  Constant         TREATMENT   Ther-ex Octane xRide L3/4 x 5 minutes for warm-up during history (3 minutes unbilled); Quantum leg press 70# x 20, 75# x 20; Quantum heel raises 70# x 30, 75# x 20;  Seated marches with green tband x 20 BLE; Seated clams with green tband x 20 BLE; Seated adductor squeezes with ball between knees x 20 BLE;  Sit to stand without UE support with Airex pad under feet x 10; BOSU (round side up) forward lunges without UE support x 10 on each side;   Neuromuscular Re-education BOSU (round side up) balance without UE support x 60s; 1/2 foam roll (flat side up) A/P balance without UE support x 30s; 1/2 foam roll (flat side up) heel/toe rocking without UE support x 10 each direction; 1/2 foam roll (flat side up) tandem balance alternating forward LE 30s x 2 each;   Pt educated throughout session about proper posture and technique with exercises. Improved exercise technique, movement at target joints, use of target muscles after min to mod verbal, visual, tactile cues.   Pt demonstrates excellent motivation during session today.She reports some chronic low back pain but it doesn't worsen during therapy sessiontoday.She is able to complete all exercises as instructed with intermittent seated rest breaks due to fatigue. Utilized BOSU ball for strengthening as well as balance exercises. She is able to increase her resistance on the leg press today.Pt encouraged tocontinue HEP andfollow-up as scheduled.Pt will benefit from PT services to address deficits in strength, balance, and mobility in order to return to full function at home.                         PT Short Term Goals - 06/25/19 0817      PT SHORT TERM GOAL #1   Title  Pt will be independent with home exercise program in order to  improve balance and leg strength to decrease fall risk and improve function at home    Time  6    Period  Weeks    Status  New    Target Date  08/06/19        PT Long Term Goals - 06/25/19 0818      PT LONG TERM GOAL #1   Title  Pt will improve BERG by at least 3 points in order to demonstrate clinically significant improvement in balance.    Baseline  06/24/19: 43/56    Time  12    Period  Weeks    Status  New  Target Date  09/16/19      PT LONG TERM GOAL #2   Title  Pt will improve ABC by at least 13% in order to demonstrate clinically significant improvement in balance confidence.    Baseline  06/24/19: 35%    Time  12    Period  Weeks    Status  New    Target Date  09/16/19      PT LONG TERM GOAL #3   Title  Pt will increase self-selected 10MWT by at least 0.13 m/s in order to demonstrate clinically significant improvement in community ambulation.    Baseline  06/24/19: 14.3s = 0.70 m/s    Time  12    Period  Weeks    Status  New    Target Date  09/16/19            Plan - 07/15/19 1429    Clinical Impression Statement  Pt demonstrates excellent motivation during session today. She reports some chronic low back pain but it doesn't worsen during therapy session today. She is able to complete all exercises as instructed with intermittent seated rest breaks due to fatigue. Utilized BOSU ball for strengthening as well as balance exercises. She is able to increase her resistance on the leg press today. Pt encouraged to continue HEP and follow-up as scheduled. Pt will benefit from PT services to address deficits in strength, balance, and mobility in order to return to full function at home.    Personal Factors and Comorbidities  Age;Comorbidity 3+;Past/Current Experience;Time since onset of injury/illness/exacerbation    Comorbidities  Neuropathy, chronic back pain, MI, OA, CAD, HTN    Examination-Activity Limitations  Bend;Locomotion Level;Lift;Stairs;Stand     Examination-Participation Restrictions  Community Activity;Laundry;Yard Work    Stability/Clinical Decision Making  Unstable/Unpredictable    Rehab Potential  Fair    PT Frequency  2x / week    PT Duration  12 weeks    PT Treatment/Interventions  ADLs/Self Care Home Management;Aquatic Therapy;Biofeedback;Canalith Repostioning;Cryotherapy;Electrical Stimulation;Iontophoresis 4mg /ml Dexamethasone;Moist Heat;Traction;Ultrasound;DME Instruction;Gait training;Stair training;Functional mobility training;Therapeutic activities;Therapeutic exercise;Balance training;Neuromuscular re-education;Cognitive remediation;Patient/family education;Manual techniques;Passive range of motion;Dry needling;Vestibular;Spinal Manipulations;Joint Manipulations    PT Next Visit Plan  Initiate strength and balance exercises during session as well as HEP    PT Home Exercise Plan  None currently    Consulted and Agree with Plan of Care  Patient       Patient will benefit from skilled therapeutic intervention in order to improve the following deficits and impairments:  Abnormal gait, Decreased activity tolerance, Decreased balance, Decreased mobility, Decreased strength, Difficulty walking, Impaired sensation, Pain  Visit Diagnosis: Unsteadiness on feet  Muscle weakness (generalized)     Problem List Patient Active Problem List   Diagnosis Date Noted  . Chemotherapy-induced neuropathy ([place]) 04/17/2018  . Neuropathy 04/17/2018  . Chemotherapy-induced peripheral neuropathy ([place]) 03/01/2018  . Persistent atrial fibrillation ([place])   . Port-A-Cath in place 03/28/2017  . Bradycardia 01/18/2017  . Obesity (BMI 30-39.9) 01/17/2017  . Pulmonary nodules 12/05/2016  . Compression fracture of L5 vertebra with delayed healing 11/01/2016  . Sacral insufficiency fracture with delayed healing 11/01/2016  . Sacral insufficiency fracture 09/05/2016  . Absence of bladder continence 07/21/2015  . Neuropathy associated with cancer  ([place]) 06/02/2015  . Carcinoma of endometrium ([place]) 04/02/2015  . Gastro-esophageal reflux disease without esophagitis 02/15/2015  . Osteopenia 08/17/2014  . Hyperlipidemia 02/13/2014  . Kidney stones 10/27/2013  . CA skin, basal cell 06/30/2013  . Lumbar canal stenosis 06/30/2013  . Arthritis,  degenerative 06/30/2013  . Neuralgia neuritis, sciatic nerve 06/30/2013  . Coronary artery disease 10/25/2012  . Dyslipidemia 10/25/2012  . NSVT (nonsustained ventricular tachycardia) ([place]) 10/25/2012  . Sick sinus syndrome ([place]) 10/25/2012  . Ischemic cardiomyopathy 10/22/2012  . Old anterior wall myocardial infarction 10/20/2012  . Hypertensive heart disease without CHF 10/20/2012   Lyndel Safe Quentavious Rittenhouse PT, DPT, GCS  Avagail Whittlesey 07/15/2019, 5:34 PM  [place] MAIN [facility] SERVICES [address] [place], [place], [address] Phone: [phone]   Fax:  [phone]  Name: AMILLION STARNES MRN: [id number] Date of Birth: 1934-12-28

## 2019-07-17 ENCOUNTER — Other Ambulatory Visit: Payer: Self-pay | Admitting: Cardiovascular Disease

## 2019-07-17 ENCOUNTER — Other Ambulatory Visit: Payer: Self-pay

## 2019-07-17 ENCOUNTER — Ambulatory Visit: Payer: Medicare PPO

## 2019-07-17 DIAGNOSIS — M6281 Muscle weakness (generalized): Secondary | ICD-10-CM

## 2019-07-17 DIAGNOSIS — R2681 Unsteadiness on feet: Secondary | ICD-10-CM

## 2019-07-17 NOTE — Therapy (Signed)
[place] MAIN [facility] SERVICES [address] [place], [place], [address] Phone: [phone]   Fax:  [phone]  Physical Therapy Treatment  Patient Details  Name: Susan [name] MRN: [id number] Date of Birth: 1934-04-25 Referring Provider (Susan [name]): Dr. Melrose Nakayama   Encounter Date: 07/17/2019  Susan [name] End of Session - 07/17/19 1258    Visit Number  7    Number of Visits  25    Date for Susan [name] Re-Evaluation  09/16/19    Authorization Type  eval 06/24/19    Susan [name] Start Time  1259    Susan [name] Stop Time  1344    Susan [name] Time Calculation (min)  45 min    Equipment Utilized During Treatment  Gait belt    Activity Tolerance  Patient tolerated treatment well    Behavior During Therapy  [facility] for tasks assessed/performed       Past Medical History:  Diagnosis Date  . Bradycardia   . Chronic back pain   . Coronary artery disease    a. s/p STEMI in 2014 with DES to LAD with staged PCI/DES placement to LCx  . Echoencephalogram abnormality   . Heart murmur   . History of Holter monitoring   . Hyperlipidemia   . Hypertension   . Myocardial infarction ([place])   . Neuromuscular disorder (HCC)    neuropathy  . NSVT (nonsustained ventricular tachycardia) ([place])   . Personal history of chemotherapy    ENDOMETRIAL CA  . Personal history of radiation therapy    ENDOMETRIAL CA  . Uterine cancer ([place])    serrous    Past Surgical History:  Procedure Laterality Date  . ABDOMINAL HYSTERECTOMY    . BREAST EXCISIONAL BIOPSY Right 1956   NEG  . CARDIOVERSION N/A 07/27/2017   Procedure: CARDIOVERSION;  Surgeon: Skeet Latch, MD;  Location: [place];  Service: Cardiovascular;  Laterality: N/A;  . LEFT HEART CATHETERIZATION WITH CORONARY ANGIOGRAM  10/19/2012   Procedure: LEFT HEART CATHETERIZATION WITH CORONARY ANGIOGRAM;  Surgeon: Troy Sine, MD;  Location: [facility] CATH LAB;  Service: Cardiovascular;;  . PERCUTANEOUS CORONARY STENT INTERVENTION (PCI-S)  10/19/2012   Procedure:  PERCUTANEOUS CORONARY STENT INTERVENTION (PCI-S);  Surgeon: Troy Sine, MD;  Location: [facility] CATH LAB;  Service: Cardiovascular;;  . PERCUTANEOUS CORONARY STENT INTERVENTION (PCI-S) N/A 10/23/2012   Procedure: PERCUTANEOUS CORONARY STENT INTERVENTION (PCI-S);  Surgeon: Troy Sine, MD;  Location: [facility] CATH LAB;  Service: Cardiovascular;  Laterality: N/A;  . PERIPHERAL VASCULAR CATHETERIZATION N/A 05/05/2015   Procedure: Glori Luis Cath Insertion;  Surgeon: Algernon Huxley, MD;  Location: [place] CV LAB;  Service: Cardiovascular;  Laterality: N/A;  . PORTA CATH REMOVAL N/A 04/05/2017   Procedure: PORTA CATH REMOVAL;  Surgeon: Algernon Huxley, MD;  Location: [place] CV LAB;  Service: Cardiovascular;  Laterality: N/A;    There were no vitals filed for this visit.  Subjective Assessment - 07/17/19 1304    Subjective  Patient reports her back pain is steady at this time. No falls or LBO since last session.    Pertinent History  Susan [name] reports imbalance related to chemo-induced neuropathy which started 4 years ago. Neuropathy occurs in stocking distribution from knees to feet. Symptoms have been unchanged since that time. She has had one fall in the last 6 months. Denies dizziness or vertigo. No heachaches/migraines. She has a history of chronic back pain and has had a bilateral nerve ablation. Her husband passed away 4 weeks ago after contracting COVID.  How long can you stand comfortably?  5 minutes    How long can you walk comfortably?  5 or 10 minutes    Patient Stated Goals  Strengthen her back muscles to decrease pain and improve her balance    Currently in Pain?  Yes    Pain Score  5     Pain Location  Back    Pain Orientation  Other (Comment)    Pain Descriptors / Indicators  Stabbing    Pain Type  Chronic pain    Pain Onset  More than a month ago          TREATMENT     Ther-ex  Octane Level 3 for cardiovascular support/challenge.   5lb ankle weight in standing with // bars: -high  knee marches 4x length of // bars, cues for upright posture and decreased velocity for optimal muscle recruitment -hip extension 10x each LE, cues for upright posture -hip abduction 10x each LE, cues for keeping feet in neutral position  -hamstring curl, 15x each LE, cues for keeping foot slightly behind opposite LE     seated hamstring stretch on 6" step 30 seconds each LE, 2 sets  Neuromuscular Re-education  1/2 foam roll (flat side up) A/P balance without UE support x 30s; 1/2 foam roll (flat side up) heel/toe rocking without UE support x 10 each direction; 1/2 foam roll (flat side up) tandem balance alternating forward LE 30s x 2 each; airex pad with 6" step modified tandem stance with one foot on each surface x 40 second holds x 2 trials each LE position Lateral step up/down from airex pad to 6" step onto lateral airex pad x 10x each direction Step over half foam roller and back 10x each LE.   Susan [name] educated throughout session about proper posture and technique with exercises. Improved exercise technique, movement at target joints, use of target muscles after min to mod verbal, visual, tactile cues.  Patient requires frequent cueing for task orientation due to verbose nature.                 Susan [name] Education - 07/17/19 1258    Education Details  exercise technique, body mechanics    Person(s) Educated  Patient    Methods  Explanation;Demonstration;Tactile cues;Verbal cues    Comprehension  Verbalized understanding;Returned demonstration;Verbal cues required;Tactile cues required       Susan [name] Short Term Goals - 06/25/19 0817      Susan [name] SHORT TERM GOAL #1   Title  Susan [name] will be independent with home exercise program in order to improve balance and leg strength to decrease fall risk and improve function at home    Time  6    Period  Weeks    Status  New    Target Date  08/06/19        Susan [name] Long Term Goals - 06/25/19 0818      Susan [name] LONG TERM GOAL #1   Title  Susan [name] will improve BERG by  at least 3 points in order to demonstrate clinically significant improvement in balance.    Baseline  06/24/19: 43/56    Time  12    Period  Weeks    Status  New    Target Date  09/16/19      Susan [name] LONG TERM GOAL #2   Title  Susan [name] will improve ABC by at least 13% in order to demonstrate clinically significant improvement in balance confidence.    Baseline  06/24/19: 35%  Time  12    Period  Weeks    Status  New    Target Date  09/16/19      Susan [name] LONG TERM GOAL #3   Title  Susan [name] will increase self-selected 10MWT by at least 0.13 m/s in order to demonstrate clinically significant improvement in community ambulation.    Baseline  06/24/19: 14.3s = 0.70 m/s    Time  12    Period  Weeks    Status  New    Target Date  09/16/19            Plan - 07/17/19 1726    Clinical Impression Statement  Patient demonstrates excellent motivation throughout session today. Low back pain is noted throughout session however is improved by hamstring stretch. Unstable surfaces is challenging for patient with limited ankle righting reactions. Susan [name] encouraged to continue HEP and follow-up as scheduled. Susan [name] will benefit from Susan [name] services to address deficits in strength, balance, and mobility in order to return to full function at home.    Personal Factors and Comorbidities  Age;Comorbidity 3+;Past/Current Experience;Time since onset of injury/illness/exacerbation    Comorbidities  Neuropathy, chronic back pain, MI, OA, CAD, HTN    Examination-Activity Limitations  Bend;Locomotion Level;Lift;Stairs;Stand    Examination-Participation Restrictions  Community Activity;Laundry;Yard Work    Stability/Clinical Decision Making  Unstable/Unpredictable    Rehab Potential  Fair    Susan [name] Frequency  2x / week    Susan [name] Duration  12 weeks    Susan [name] Treatment/Interventions  ADLs/Self Care Home Management;Aquatic Therapy;Biofeedback;Canalith Repostioning;Cryotherapy;Electrical Stimulation;Iontophoresis 4mg /ml Dexamethasone;Moist  Heat;Traction;Ultrasound;DME Instruction;Gait training;Stair training;Functional mobility training;Therapeutic activities;Therapeutic exercise;Balance training;Neuromuscular re-education;Cognitive remediation;Patient/family education;Manual techniques;Passive range of motion;Dry needling;Vestibular;Spinal Manipulations;Joint Manipulations    Susan [name] Next Visit Plan  Initiate strength and balance exercises during session as well as HEP    Susan [name] Home Exercise Plan  None currently    Consulted and Agree with Plan of Care  Patient       Patient will benefit from skilled therapeutic intervention in order to improve the following deficits and impairments:  Abnormal gait, Decreased activity tolerance, Decreased balance, Decreased mobility, Decreased strength, Difficulty walking, Impaired sensation, Pain  Visit Diagnosis: Unsteadiness on feet  Muscle weakness (generalized)     Problem List Patient Active Problem List   Diagnosis Date Noted  . Chemotherapy-induced neuropathy ([place]) 04/17/2018  . Neuropathy 04/17/2018  . Chemotherapy-induced peripheral neuropathy ([place]) 03/01/2018  . Persistent atrial fibrillation ([place])   . Port-A-Cath in place 03/28/2017  . Bradycardia 01/18/2017  . Obesity (BMI 30-39.9) 01/17/2017  . Pulmonary nodules 12/05/2016  . Compression fracture of L5 vertebra with delayed healing 11/01/2016  . Sacral insufficiency fracture with delayed healing 11/01/2016  . Sacral insufficiency fracture 09/05/2016  . Absence of bladder continence 07/21/2015  . Neuropathy associated with cancer ([place]) 06/02/2015  . Carcinoma of endometrium ([place]) 04/02/2015  . Gastro-esophageal reflux disease without esophagitis 02/15/2015  . Osteopenia 08/17/2014  . Hyperlipidemia 02/13/2014  . Kidney stones 10/27/2013  . CA skin, basal cell 06/30/2013  . Lumbar canal stenosis 06/30/2013  . Arthritis, degenerative 06/30/2013  . Neuralgia neuritis, sciatic nerve 06/30/2013  . Coronary artery disease  10/25/2012  . Dyslipidemia 10/25/2012  . NSVT (nonsustained ventricular tachycardia) ([place]) 10/25/2012  . Sick sinus syndrome ([place]) 10/25/2012  . Ischemic cardiomyopathy 10/22/2012  . Old anterior wall myocardial infarction 10/20/2012  . Hypertensive heart disease without CHF 10/20/2012   Susan [name], Susan [name], Susan [name]   07/17/2019, 5:27 PM  [place] MAIN [facility] SERVICES  [place], [place], [address] Phone: [phone]   Fax:  [phone]  Name: Susan [name] MRN: [id number] Date of Birth: 1934-10-17

## 2019-07-22 ENCOUNTER — Other Ambulatory Visit: Payer: Self-pay

## 2019-07-22 ENCOUNTER — Other Ambulatory Visit: Payer: Self-pay | Admitting: Cardiovascular Disease

## 2019-07-22 ENCOUNTER — Ambulatory Visit: Payer: Medicare PPO

## 2019-07-22 DIAGNOSIS — R2681 Unsteadiness on feet: Secondary | ICD-10-CM | POA: Diagnosis not present

## 2019-07-22 DIAGNOSIS — M6281 Muscle weakness (generalized): Secondary | ICD-10-CM

## 2019-07-22 NOTE — Therapy (Signed)
[place] MAIN [facility] SERVICES [address] [place], [place], [address] Phone: [phone]   Fax:  [phone]  Physical Therapy Treatment  Patient Details  Name: Susan [name] MRN: [id number] Date of Birth: 1935/02/15 Referring Provider (PT): Dr. Melrose Nakayama   Encounter Date: 07/22/2019  PT End of Session - 07/22/19 1439    Visit Number  8    Number of Visits  25    Date for PT Re-Evaluation  09/16/19    Authorization Type  eval 06/24/19    PT Start Time  1430    PT Stop Time  1515    PT Time Calculation (min)  45 min    Equipment Utilized During Treatment  Gait belt    Activity Tolerance  Patient tolerated treatment well    Behavior During Therapy  [facility] for tasks assessed/performed       Past Medical History:  Diagnosis Date  . Bradycardia   . Chronic back pain   . Coronary artery disease    a. s/p STEMI in 2014 with DES to LAD with staged PCI/DES placement to LCx  . Echoencephalogram abnormality   . Heart murmur   . History of Holter monitoring   . Hyperlipidemia   . Hypertension   . Myocardial infarction ([place])   . Neuromuscular disorder (HCC)    neuropathy  . NSVT (nonsustained ventricular tachycardia) ([place])   . Personal history of chemotherapy    ENDOMETRIAL CA  . Personal history of radiation therapy    ENDOMETRIAL CA  . Uterine cancer ([place])    serrous    Past Surgical History:  Procedure Laterality Date  . ABDOMINAL HYSTERECTOMY    . BREAST EXCISIONAL BIOPSY Right 1956   NEG  . CARDIOVERSION N/A 07/27/2017   Procedure: CARDIOVERSION;  Surgeon: Skeet Latch, MD;  Location: [place];  Service: Cardiovascular;  Laterality: N/A;  . LEFT HEART CATHETERIZATION WITH CORONARY ANGIOGRAM  10/19/2012   Procedure: LEFT HEART CATHETERIZATION WITH CORONARY ANGIOGRAM;  Surgeon: Troy Sine, MD;  Location: [facility] CATH LAB;  Service: Cardiovascular;;  . PERCUTANEOUS CORONARY STENT INTERVENTION (PCI-S)  10/19/2012   Procedure:  PERCUTANEOUS CORONARY STENT INTERVENTION (PCI-S);  Surgeon: Troy Sine, MD;  Location: [facility] CATH LAB;  Service: Cardiovascular;;  . PERCUTANEOUS CORONARY STENT INTERVENTION (PCI-S) N/A 10/23/2012   Procedure: PERCUTANEOUS CORONARY STENT INTERVENTION (PCI-S);  Surgeon: Troy Sine, MD;  Location: [facility] CATH LAB;  Service: Cardiovascular;  Laterality: N/A;  . PERIPHERAL VASCULAR CATHETERIZATION N/A 05/05/2015   Procedure: Glori Luis Cath Insertion;  Surgeon: Algernon Huxley, MD;  Location: [place] CV LAB;  Service: Cardiovascular;  Laterality: N/A;  . PORTA CATH REMOVAL N/A 04/05/2017   Procedure: PORTA CATH REMOVAL;  Surgeon: Algernon Huxley, MD;  Location: [place] CV LAB;  Service: Cardiovascular;  Laterality: N/A;    There were no vitals filed for this visit.  Subjective Assessment - 07/22/19 1438    Subjective  Patient reports her back pain is unchanged at this time. No falls or LOB since last session. No specific questions or concerns.    Pertinent History  Pt reports imbalance related to chemo-induced neuropathy which started 4 years ago. Neuropathy occurs in stocking distribution from knees to feet. Symptoms have been unchanged since that time. She has had one fall in the last 6 months. Denies dizziness or vertigo. No heachaches/migraines. She has a history of chronic back pain and has had a bilateral nerve ablation. Her husband passed away 4 weeks ago  after contracting COVID.    How long can you stand comfortably?  5 minutes    How long can you walk comfortably?  5 or 10 minutes    Patient Stated Goals  Strengthen her back muscles to decrease pain and improve her balance    Currently in Pain?  Yes    Pain Score  5     Pain Location  Back    Pain Orientation  Other (Comment)   Center near spine   Pain Descriptors / Indicators  Stabbing    Pain Type  Chronic pain    Pain Onset  More than a month ago    Pain Frequency  Constant         TREATMENT   Ther-ex Octane xRide L3 x 5  minutes for warm-up during history (4 minutes unbilled); Quantum leg press 70# x 20, 85# x 20; Quantum heel raises 75# 2 x 20; Sit to stand without UE support with Airex pad under feet and 4# dumbbells in each hand 2 x 10;  Seated marches with green tband x 20 BLE; Seated clams with green tband x 20 BLE; Seated adductor squeezes with ball between knees x 20 BLE;   Neuromuscular Re-education Airex NBOS eyes closed x 30s; Airex 6" alternating toe taps x 10 on each side; Airex with forward foot on 6" step 30s x 2 each;   Pt educated throughout session about proper posture and technique with exercises. Improved exercise technique, movement at target joints, use of target muscles after min to mod verbal, visual, tactile cues.   Pt demonstrates excellent motivation during session today.She reports some chronic low back pain but it doesn't worsen during therapy sessiontoday.She is able to complete all exercises as instructed with intermittent seated rest breaks due to fatigue. She continues to struggle with balance on unstable surfaces such as Airex pad. She is able to increase her resistance on the leg press today.Pt encouraged tocontinue HEP andfollow-up as scheduled.Pt will benefit from PT services to address deficits in strength, balance, and mobility in order to return to full function at home.                           PT Short Term Goals - 06/25/19 0817      PT SHORT TERM GOAL #1   Title  Pt will be independent with home exercise program in order to improve balance and leg strength to decrease fall risk and improve function at home    Time  6    Period  Weeks    Status  New    Target Date  08/06/19        PT Long Term Goals - 06/25/19 0818      PT LONG TERM GOAL #1   Title  Pt will improve BERG by at least 3 points in order to demonstrate clinically significant improvement in balance.    Baseline  06/24/19: 43/56    Time  12    Period   Weeks    Status  New    Target Date  09/16/19      PT LONG TERM GOAL #2   Title  Pt will improve ABC by at least 13% in order to demonstrate clinically significant improvement in balance confidence.    Baseline  06/24/19: 35%    Time  12    Period  Weeks    Status  New    Target Date  09/16/19  PT LONG TERM GOAL #3   Title  Pt will increase self-selected 10MWT by at least 0.13 m/s in order to demonstrate clinically significant improvement in community ambulation.    Baseline  06/24/19: 14.3s = 0.70 m/s    Time  12    Period  Weeks    Status  New    Target Date  09/16/19            Plan - 07/22/19 1439    Clinical Impression Statement  Pt demonstrates excellent motivation during session today. She reports some chronic low back pain but it doesn't worsen during therapy session today. She is able to complete all exercises as instructed with intermittent seated rest breaks due to fatigue. She continues to struggle with balance on unstable surfaces such as Airex pad. She is able to increase her resistance on the leg press today. Pt encouraged to continue HEP and follow-up as scheduled. Pt will benefit from PT services to address deficits in strength, balance, and mobility in order to return to full function at home.    Personal Factors and Comorbidities  Age;Comorbidity 3+;Past/Current Experience;Time since onset of injury/illness/exacerbation    Comorbidities  Neuropathy, chronic back pain, MI, OA, CAD, HTN    Examination-Activity Limitations  Bend;Locomotion Level;Lift;Stairs;Stand    Examination-Participation Restrictions  Community Activity;Laundry;Yard Work    Stability/Clinical Decision Making  Unstable/Unpredictable    Rehab Potential  Fair    PT Frequency  2x / week    PT Duration  12 weeks    PT Treatment/Interventions  ADLs/Self Care Home Management;Aquatic Therapy;Biofeedback;Canalith Repostioning;Cryotherapy;Electrical Stimulation;Iontophoresis 4mg /ml Dexamethasone;Moist  Heat;Traction;Ultrasound;DME Instruction;Gait training;Stair training;Functional mobility training;Therapeutic activities;Therapeutic exercise;Balance training;Neuromuscular re-education;Cognitive remediation;Patient/family education;Manual techniques;Passive range of motion;Dry needling;Vestibular;Spinal Manipulations;Joint Manipulations    PT Next Visit Plan  Initiate strength and balance exercises during session as well as HEP    PT Home Exercise Plan  None currently    Consulted and Agree with Plan of Care  Patient       Patient will benefit from skilled therapeutic intervention in order to improve the following deficits and impairments:  Abnormal gait, Decreased activity tolerance, Decreased balance, Decreased mobility, Decreased strength, Difficulty walking, Impaired sensation, Pain  Visit Diagnosis: Unsteadiness on feet  Muscle weakness (generalized)     Problem List Patient Active Problem List   Diagnosis Date Noted  . Chemotherapy-induced neuropathy ([place]) 04/17/2018  . Neuropathy 04/17/2018  . Chemotherapy-induced peripheral neuropathy ([place]) 03/01/2018  . Persistent atrial fibrillation ([place])   . Port-A-Cath in place 03/28/2017  . Bradycardia 01/18/2017  . Obesity (BMI 30-39.9) 01/17/2017  . Pulmonary nodules 12/05/2016  . Compression fracture of L5 vertebra with delayed healing 11/01/2016  . Sacral insufficiency fracture with delayed healing 11/01/2016  . Sacral insufficiency fracture 09/05/2016  . Absence of bladder continence 07/21/2015  . Neuropathy associated with cancer ([place]) 06/02/2015  . Carcinoma of endometrium ([place]) 04/02/2015  . Gastro-esophageal reflux disease without esophagitis 02/15/2015  . Osteopenia 08/17/2014  . Hyperlipidemia 02/13/2014  . Kidney stones 10/27/2013  . CA skin, basal cell 06/30/2013  . Lumbar canal stenosis 06/30/2013  . Arthritis, degenerative 06/30/2013  . Neuralgia neuritis, sciatic nerve 06/30/2013  . Coronary artery disease  10/25/2012  . Dyslipidemia 10/25/2012  . NSVT (nonsustained ventricular tachycardia) ([place]) 10/25/2012  . Sick sinus syndrome ([place]) 10/25/2012  . Ischemic cardiomyopathy 10/22/2012  . Old anterior wall myocardial infarction 10/20/2012  . Hypertensive heart disease without CHF 10/20/2012   Lyndel Safe Lucretia Pendley PT, DPT, GCS  Misao Fackrell 07/22/2019, 3:17 PM  [place] MAIN [facility] SERVICES [address] [place], [place], [address] Phone: [phone]   Fax:  [phone]  Name: MALARIE CRESAP MRN: [id number] Date of Birth: [birth year]

## 2019-07-24 ENCOUNTER — Ambulatory Visit: Payer: Medicare PPO

## 2019-07-24 ENCOUNTER — Other Ambulatory Visit: Payer: Self-pay

## 2019-07-24 DIAGNOSIS — R2681 Unsteadiness on feet: Secondary | ICD-10-CM | POA: Diagnosis not present

## 2019-07-24 DIAGNOSIS — M6281 Muscle weakness (generalized): Secondary | ICD-10-CM

## 2019-07-24 NOTE — Therapy (Signed)
[place] MAIN [facility] SERVICES [address] [place], [place], [address] Phone: [phone]   Fax:  [phone]  Physical Therapy Treatment  Patient Details  Name: Susan [name] MRN: [id number] Date of Birth: 03-06-1935 Referring Provider (PT): Dr. Melrose Nakayama   Encounter Date: 07/24/2019  PT End of Session - 07/24/19 0950    Visit Number  9    Number of Visits  25    Date for PT Re-Evaluation  09/16/19    Authorization Type  eval 06/24/19    Equipment Utilized During Treatment  Gait belt    Activity Tolerance  Patient tolerated treatment well    Behavior During Therapy  [facility] for tasks assessed/performed       Past Medical History:  Diagnosis Date  . Bradycardia   . Chronic back pain   . Coronary artery disease    a. s/p STEMI in 2014 with DES to LAD with staged PCI/DES placement to LCx  . Echoencephalogram abnormality   . Heart murmur   . History of Holter monitoring   . Hyperlipidemia   . Hypertension   . Myocardial infarction ([place])   . Neuromuscular disorder (HCC)    neuropathy  . NSVT (nonsustained ventricular tachycardia) ([place])   . Personal history of chemotherapy    ENDOMETRIAL CA  . Personal history of radiation therapy    ENDOMETRIAL CA  . Uterine cancer ([place])    serrous    Past Surgical History:  Procedure Laterality Date  . ABDOMINAL HYSTERECTOMY    . BREAST EXCISIONAL BIOPSY Right 1956   NEG  . CARDIOVERSION N/A 07/27/2017   Procedure: CARDIOVERSION;  Surgeon: Skeet Latch, MD;  Location: [facility];  Service: Cardiovascular;  Laterality: N/A;  . LEFT HEART CATHETERIZATION WITH CORONARY ANGIOGRAM  10/19/2012   Procedure: LEFT HEART CATHETERIZATION WITH CORONARY ANGIOGRAM;  Surgeon: Troy Sine, MD;  Location[facility] CATH LAB;  Service: Cardiovascular;;  . PERCUTANEOUS CORONARY STENT INTERVENTION (PCI-S)  10/19/2012   Procedure: PERCUTANEOUS CORONARY STENT INTERVENTION (PCI-S);  Surgeon: Troy Sine, MD;   Location: [facility] CATH LAB;  Service: Cardiovascular;;  . PERCUTANEOUS CORONARY STENT INTERVENTION (PCI-S) N/A 10/23/2012   Procedure: PERCUTANEOUS CORONARY STENT INTERVENTION (PCI-S);  Surgeon: Troy Sine, MD;  Location: [facility] CATH LAB;  Service: Cardiovascular;  Laterality: N/A;  . PERIPHERAL VASCULAR CATHETERIZATION N/A 05/05/2015   Procedure: Glori Luis Cath Insertion;  Surgeon: Algernon Huxley, MD;  Location: [place] CV LAB;  Service: Cardiovascular;  Laterality: N/A;  . PORTA CATH REMOVAL N/A 04/05/2017   Procedure: PORTA CATH REMOVAL;  Surgeon: Algernon Huxley, MD;  Location: [place] CV LAB;  Service: Cardiovascular;  Laterality: N/A;    There were no vitals filed for this visit.  Subjective Assessment - 07/24/19 0947    Subjective  Patient reports her back pain is unchanged at this time. No falls or LOB since last session. No specific questions or concerns.    Pertinent History  Pt reports imbalance related to chemo-induced neuropathy which started 4 years ago. Neuropathy occurs in stocking distribution from knees to feet. Symptoms have been unchanged since that time. She has had one fall in the last 6 months. Denies dizziness or vertigo. No heachaches/migraines. She has a history of chronic back pain and has had a bilateral nerve ablation. Her husband passed away 4 weeks ago after contracting COVID.    How long can you stand comfortably?  5 minutes    How long can you walk comfortably?  5  or 10 minutes    Patient Stated Goals  Strengthen her back muscles to decrease pain and improve her balance    Currently in Pain?  Yes    Pain Score  5     Pain Location  Back    Pain Orientation  Other (Comment)   Center near spine   Pain Descriptors / Indicators  Stabbing    Pain Type  Chronic pain    Pain Onset  More than a month ago    Pain Frequency  Constant        TREATMENT   Ther-ex Octane xRide L3/4 x 5 minutes for warm-up during history (4 minutes unbilled); Quantum leg press 85# x  20; Quantum heel raises 85# 2 x 20;  Seated marches with 3# ankle weights x 20 BLE; Seated clams with manual resistance x 20 BLE; Seated adductor squeezes with manual resistance x 20 BLE;  Standing exercises with 3# ankle weights (AW) BLE; Hip flexion marches x 20; Hip abduction x 20; HS curls x 20;  Applied Continuum Estim unit to lower  back with modulated TENS settings at pt tolerated intensity of 25 bilaterally x 20 minutes during exercise for pt comfort and to improve tolerance to activity (unbilled);    Neuromuscular Re-education 1/2 foam roll (round side up) A/P balance x 30s; 1/2 foam roll (round side up) tandem balance alternating forward LE x 30s each; 1/2 foam roll (flat side up) A/P balance x 30s; 1/2 foam roll (flat side up) A/P heel/toe rocks x 10 each direction;   Pt educated throughout session about proper posture and technique with exercises. Improved exercise technique, movement at target joints, use of target muscles after min to mod verbal, visual, tactile cues.   Pt demonstrates excellent motivation during session today.Utilized estim unit with modulated TENS settings to lumbar spine to trials whether it is effective for her pain and to improve compliance with exercises. She is able to complete all exercises as instructed with intermittent seated rest breaks due to fatigue. She continues to struggle with balance on unstable surfaces such as 1/2 foam roll today however her balance appears to be improving. Will update outcome measures and goals at next visit for a progress note. Will also review her HEP. Pt encouraged tofollow-up as scheduled.Pt will benefit from PT services to address deficits in strength, balance, and mobility in order to return to full function at home.                           PT Short Term Goals - 06/25/19 0817      PT SHORT TERM GOAL #1   Title  Pt will be independent with home exercise program in order to  improve balance and leg strength to decrease fall risk and improve function at home    Time  6    Period  Weeks    Status  New    Target Date  08/06/19        PT Long Term Goals - 06/25/19 0818      PT LONG TERM GOAL #1   Title  Pt will improve BERG by at least 3 points in order to demonstrate clinically significant improvement in balance.    Baseline  06/24/19: 43/56    Time  12    Period  Weeks    Status  New    Target Date  09/16/19      PT LONG TERM GOAL #2  Title  Pt will improve ABC by at least 13% in order to demonstrate clinically significant improvement in balance confidence.    Baseline  06/24/19: 35%    Time  12    Period  Weeks    Status  New    Target Date  09/16/19      PT LONG TERM GOAL #3   Title  Pt will increase self-selected 10MWT by at least 0.13 m/s in order to demonstrate clinically significant improvement in community ambulation.    Baseline  06/24/19: 14.3s = 0.70 m/s    Time  12    Period  Weeks    Status  New    Target Date  09/16/19            Plan - 07/24/19 1130    Clinical Impression Statement  Pt demonstrates excellent motivation during session today. Utilized estim unit with modulated TENS settings to lumbar spine to trials whether it is effective for her pain and to improve compliance with exercises. She is able to complete all exercises as instructed with intermittent seated rest breaks due to fatigue. She continues to struggle with balance on unstable surfaces such as 1/2 foam roll today however her balance appears to be improving. Will update outcome measures and goals at next visit for a progress note. Will also review her HEP.  Pt encouraged to follow-up as scheduled. Pt will benefit from PT services to address deficits in strength, balance, and mobility in order to return to full function at home.    Personal Factors and Comorbidities  Age;Comorbidity 3+;Past/Current Experience;Time since onset of injury/illness/exacerbation     Comorbidities  Neuropathy, chronic back pain, MI, OA, CAD, HTN    Examination-Activity Limitations  Bend;Locomotion Level;Lift;Stairs;Stand    Examination-Participation Restrictions  Community Activity;Laundry;Yard Work    Stability/Clinical Decision Making  Unstable/Unpredictable    Rehab Potential  Fair    PT Frequency  2x / week    PT Duration  12 weeks    PT Treatment/Interventions  ADLs/Self Care Home Management;Aquatic Therapy;Biofeedback;Canalith Repostioning;Cryotherapy;Electrical Stimulation;Iontophoresis 4mg /ml Dexamethasone;Moist Heat;Traction;Ultrasound;DME Instruction;Gait training;Stair training;Functional mobility training;Therapeutic activities;Therapeutic exercise;Balance training;Neuromuscular re-education;Cognitive remediation;Patient/family education;Manual techniques;Passive range of motion;Dry needling;Vestibular;Spinal Manipulations;Joint Manipulations    PT Next Visit Plan  Update outcome measures, goals, progress note; Initiate strength and balance exercises during session as well as HEP    PT Home Exercise Plan  None currently    Consulted and Agree with Plan of Care  Patient       Patient will benefit from skilled therapeutic intervention in order to improve the following deficits and impairments:  Abnormal gait, Decreased activity tolerance, Decreased balance, Decreased mobility, Decreased strength, Difficulty walking, Impaired sensation, Pain  Visit Diagnosis: Unsteadiness on feet  Muscle weakness (generalized)     Problem List Patient Active Problem List   Diagnosis Date Noted  . Chemotherapy-induced neuropathy ([place]) 04/17/2018  . Neuropathy 04/17/2018  . Chemotherapy-induced peripheral neuropathy ([place]) 03/01/2018  . Persistent atrial fibrillation ([place])   . Port-A-Cath in place 03/28/2017  . Bradycardia 01/18/2017  . Obesity (BMI 30-39.9) 01/17/2017  . Pulmonary nodules 12/05/2016  . Compression fracture of L5 vertebra with delayed healing 11/01/2016   . Sacral insufficiency fracture with delayed healing 11/01/2016  . Sacral insufficiency fracture 09/05/2016  . Absence of bladder continence 07/21/2015  . Neuropathy associated with cancer ([place]) 06/02/2015  . Carcinoma of endometrium ([place]) 04/02/2015  . Gastro-esophageal reflux disease without esophagitis 02/15/2015  . Osteopenia 08/17/2014  . Hyperlipidemia 02/13/2014  . Kidney stones 10/27/2013  .  CA skin, basal cell 06/30/2013  . Lumbar canal stenosis 06/30/2013  . Arthritis, degenerative 06/30/2013  . Neuralgia neuritis, sciatic nerve 06/30/2013  . Coronary artery disease 10/25/2012  . Dyslipidemia 10/25/2012  . NSVT (nonsustained ventricular tachycardia) ([place]) 10/25/2012  . Sick sinus syndrome ([place]) 10/25/2012  . Ischemic cardiomyopathy 10/22/2012  . Old anterior wall myocardial infarction 10/20/2012  . Hypertensive heart disease without CHF 10/20/2012   Lyndel Safe Delon Revelo PT, DPT, GCS  Young Brim 07/24/2019, 11:34 AM  [place] MAIN [facility] SERVICES [address] [place], [place], [address] Phone: [phone]   Fax:  [phone]  Name: Susan [name] MRN: [id number] Date of Birth: 1935/01/09

## 2019-07-29 ENCOUNTER — Ambulatory Visit: Payer: Medicare PPO | Attending: Neurology

## 2019-07-29 ENCOUNTER — Other Ambulatory Visit: Payer: Self-pay

## 2019-07-29 DIAGNOSIS — M6281 Muscle weakness (generalized): Secondary | ICD-10-CM | POA: Diagnosis present

## 2019-07-29 DIAGNOSIS — R2681 Unsteadiness on feet: Secondary | ICD-10-CM | POA: Insufficient documentation

## 2019-07-29 NOTE — Therapy (Signed)
[place] MAIN [facility] SERVICES [address] [place], [place], [address] Phone: [phone]   Fax:  [phone]  Physical Therapy Progress Note   Dates of reporting period  06/24/19   to   07/29/19  Patient Details  Name: Susan [name] MRN: [id number] Date of Birth: Feb 13, 1935 Referring Provider (PT): Dr. Melrose Nakayama   Encounter Date: 07/29/2019  PT End of Session - 07/29/19 1449    Visit Number  10    Number of Visits  25    Date for PT Re-Evaluation  09/16/19    Authorization Type  eval 06/24/19, PN: 07/29/19    PT Start Time  1430    PT Stop Time  1515    PT Time Calculation (min)  45 min    Equipment Utilized During Treatment  Gait belt    Activity Tolerance  Patient tolerated treatment well    Behavior During Therapy  WFL for tasks assessed/performed       Past Medical History:  Diagnosis Date  . Bradycardia   . Chronic back pain   . Coronary artery disease    a. s/p STEMI in 2014 with DES to LAD with staged PCI/DES placement to LCx  . Echoencephalogram abnormality   . Heart murmur   . History of Holter monitoring   . Hyperlipidemia   . Hypertension   . Myocardial infarction ([place])   . Neuromuscular disorder (HCC)    neuropathy  . NSVT (nonsustained ventricular tachycardia) ([place])   . Personal history of chemotherapy    ENDOMETRIAL CA  . Personal history of radiation therapy    ENDOMETRIAL CA  . Uterine cancer ([place])    serrous    Past Surgical History:  Procedure Laterality Date  . ABDOMINAL HYSTERECTOMY    . BREAST EXCISIONAL BIOPSY Right 1956   NEG  . CARDIOVERSION N/A 07/27/2017   Procedure: CARDIOVERSION;  Surgeon: Skeet Latch, MD;  Location: [place];  Service: Cardiovascular;  Laterality: N/A;  . LEFT HEART CATHETERIZATION WITH CORONARY ANGIOGRAM  10/19/2012   Procedure: LEFT HEART CATHETERIZATION WITH CORONARY ANGIOGRAM;  Surgeon: Troy Sine, MD;  Location: [facility] CATH LAB;  Service: Cardiovascular;;  .  PERCUTANEOUS CORONARY STENT INTERVENTION (PCI-S)  10/19/2012   Procedure: PERCUTANEOUS CORONARY STENT INTERVENTION (PCI-S);  Surgeon: Troy Sine, MD;  Location: [facility] CATH LAB;  Service: Cardiovascular;;  . PERCUTANEOUS CORONARY STENT INTERVENTION (PCI-S) N/A 10/23/2012   Procedure: PERCUTANEOUS CORONARY STENT INTERVENTION (PCI-S);  Surgeon: Troy Sine, MD;  Location: [facility] CATH LAB;  Service: Cardiovascular;  Laterality: N/A;  . PERIPHERAL VASCULAR CATHETERIZATION N/A 05/05/2015   Procedure: Glori Luis Cath Insertion;  Surgeon: Algernon Huxley, MD;  Location: [place] CV LAB;  Service: Cardiovascular;  Laterality: N/A;  . PORTA CATH REMOVAL N/A 04/05/2017   Procedure: PORTA CATH REMOVAL;  Surgeon: Algernon Huxley, MD;  Location: [place] CV LAB;  Service: Cardiovascular;  Laterality: N/A;    There were no vitals filed for this visit.  Subjective Assessment - 07/29/19 1436    Subjective  Patient reports her back pain is unchanged at this time. No falls or LOB since last session. No specific questions or concerns. She is feeling more confident with her balance since starting therapy.    Pertinent History  Pt reports imbalance related to chemo-induced neuropathy which started 4 years ago. Neuropathy occurs in stocking distribution from knees to feet. Symptoms have been unchanged since that time. She has had one fall in the last 6 months.  Denies dizziness or vertigo. No heachaches/migraines. She has a history of chronic back pain and has had a bilateral nerve ablation. Her husband passed away 4 weeks ago after contracting COVID.    How long can you stand comfortably?  5 minutes    How long can you walk comfortably?  5 or 10 minutes    Patient Stated Goals  Strengthen her back muscles to decrease pain and improve her balance    Currently in Pain?  Yes    Pain Score  5     Pain Location  Back    Pain Orientation  Other (Comment)   Center near spine   Pain Descriptors / Indicators  Stabbing    Pain Type   Chronic pain    Pain Radiating Towards  None    Pain Onset  More than a month ago    Pain Frequency  Constant         [facility] PT Assessment - 07/29/19 1515      Standardized Balance Assessment   Standardized Balance Assessment  Berg Balance Test      Berg Balance Test   Sit to Stand  Able to stand without using hands and stabilize independently    Standing Unsupported  Able to stand safely 2 minutes    Sitting with Back Unsupported but Feet Supported on Floor or Stool  Able to sit safely and securely 2 minutes    Stand to Sit  Sits safely with minimal use of hands    Transfers  Able to transfer safely, minor use of hands    Standing Unsupported with Eyes Closed  Able to stand 10 seconds safely    Standing Unsupported with Feet Together  Able to place feet together independently and stand for 1 minute with supervision    From Standing, Reach Forward with Outstretched Arm  Can reach forward >12 cm safely (5")    From Standing Position, Pick up Object from Floor  Able to pick up shoe, needs supervision    From Standing Position, Turn to Look Behind Over each Shoulder  Turn sideways only but maintains balance    Turn 360 Degrees  Able to turn 360 degrees safely in 4 seconds or less    Standing Unsupported, Alternately Place Feet on Step/Stool  Able to stand independently and safely and complete 8 steps in 20 seconds    Standing Unsupported, One Foot in Front  Able to plae foot ahead of the other independently and hold 30 seconds    Standing on One Leg  Tries to lift leg/unable to hold 3 seconds but remains standing independently    Total Score  47        TREATMENT   Ther-ex NuStep L2/3 x 5 minutes for warm-up during history (4 minutes unbilled); Quantum leg press 85# x 20, 100# x 20; Quantum heel raises 85# 2 x 20;    Neuromuscular Re-education Educated pt about how to apply her personal TENS unit to her back. Adjusted settings on patient's machine for her; Estim unit  applied to low back to ensure it is functioning correctly. Updated outcome measures and goals with patient: Pt completed ABC: 55.625% (initially 35%) FOTO: 49 (initially 40, goal is 52); BERG: 47/56 (initially 43/56); 14m Walk Test: 07/29/19: 11.5s = 0.87 m/s (initially 14.3s = 0.70 m/s)   Pt educated throughout session about proper posture and technique with exercises. Improved exercise technique, movement at target joints, use of target muscles after min to mod verbal, visual,  tactile cues.   Pt demonstrates excellent motivation during session today. Educated pt about how to apply her personal TENS unit to her back and therapist adjusted settings for her. Estim unit applied to low back to ensure it is functioning correctly. Updated outcome measures and goals with patient. Her ABC has improved from 35% at initial evaluation to 55.625% today. Her FOT improved from 40 to 46 and her BERG improved from 43/56 initially to 47/56 today. Her self-selected 28m gait speed has improved from 0.70 m/s to 0.87 m/s. All of her outcome measures are improving her some of her goals are revised today. She is able to increase the resistance on the leg press today with therapist.  Pt encouraged to continue HEP and follow-up as scheduled.She will continue to benefit from PT services to address deficits in strength, balance, and mobility in order to return to full function at home.                         PT Short Term Goals - 07/29/19 1452      PT SHORT TERM GOAL #1   Title  Pt will be independent with home exercise program in order to improve balance and leg strength to decrease fall risk and improve function at home    Baseline  07/29/19: Pt reports that she performs "every other day"    Time  6    Period  Weeks    Status  On-going    Target Date  08/06/19        PT Long Term Goals - 07/29/19 1453      PT LONG TERM GOAL #1   Title  Pt will improve BERG to at least 50/56 in order  to demonstrate clinically significant improvement in balance.    Baseline  06/24/19: 43/56; 07/29/19: 47/56 (achieved initial goal of 3 point change)    Time  12    Period  Weeks    Status  Revised    Target Date  09/16/19      PT LONG TERM GOAL #2   Title  Pt will improve ABC by at least 13% in order to demonstrate clinically significant improvement in balance confidence.    Baseline  06/24/19: 35%; 07/29/19: 55.625%    Time  12    Period  Weeks    Status  Achieved      PT LONG TERM GOAL #3   Title  Pt will increase self-selected 10MWT by at least 0.13 m/s in order to demonstrate clinically significant improvement in community ambulation.    Baseline  06/24/19: 14.3s = 0.70 m/s; 07/29/19: 11.5s = 0.87 m/s;    Time  12    Period  Weeks    Status  Achieved      PT LONG TERM GOAL #4   Title  Pt will improve ABC to greater than 67% in order to demonstrate clinically significant improvement in balance confidence.    Baseline  07/29/19: 55.625%    Time  12    Period  Weeks    Status  New    Target Date  09/16/19            Plan - 07/29/19 1450    Clinical Impression Statement  Pt demonstrates excellent motivation during session today. Educated pt about how to apply her personal TENS unit to her back and therapist adjusted settings for her. Estim unit applied to low back to ensure it is functioning correctly.  Updated outcome measures and goals with patient. Her ABC has improved from 35% at initial evaluation to 55.625% today. Her FOT improved from 40 to 76 and her BERG improved from 43/56 initially to 47/56 today. Her self-selected 19m gait speed has improved from 0.70 m/s to 0.87 m/s. All of her outcome measures are improving her some of her goals are revised today. She is able to increase the resistance on the leg press today with therapist.  Pt encouraged to continue HEP and follow-up as scheduled. She will continue to benefit from PT services to address deficits in strength, balance, and  mobility in order to return to full function at home.    Personal Factors and Comorbidities  Age;Comorbidity 3+;Past/Current Experience;Time since onset of injury/illness/exacerbation    Comorbidities  Neuropathy, chronic back pain, MI, OA, CAD, HTN    Examination-Activity Limitations  Bend;Locomotion Level;Lift;Stairs;Stand    Examination-Participation Restrictions  Community Activity;Laundry;Yard Work    Stability/Clinical Decision Making  Unstable/Unpredictable    Rehab Potential  Fair    PT Frequency  2x / week    PT Duration  12 weeks    PT Treatment/Interventions  ADLs/Self Care Home Management;Aquatic Therapy;Biofeedback;Canalith Repostioning;Cryotherapy;Electrical Stimulation;Iontophoresis 4mg /ml Dexamethasone;Moist Heat;Traction;Ultrasound;DME Instruction;Gait training;Stair training;Functional mobility training;Therapeutic activities;Therapeutic exercise;Balance training;Neuromuscular re-education;Cognitive remediation;Patient/family education;Manual techniques;Passive range of motion;Dry needling;Vestibular;Spinal Manipulations;Joint Manipulations    PT Next Visit Plan  Continue with strength and balance exercises during session.    PT Home Exercise Plan  Medbridge Access Code: [email or web address]    Consulted and Agree with Plan of Care  Patient       Patient will benefit from skilled therapeutic intervention in order to improve the following deficits and impairments:  Abnormal gait, Decreased activity tolerance, Decreased balance, Decreased mobility, Decreased strength, Difficulty walking, Impaired sensation, Pain  Visit Diagnosis: Unsteadiness on feet  Muscle weakness (generalized)     Problem List Patient Active Problem List   Diagnosis Date Noted  . Chemotherapy-induced neuropathy ([place]) 04/17/2018  . Neuropathy 04/17/2018  . Chemotherapy-induced peripheral neuropathy ([place]) 03/01/2018  . Persistent atrial fibrillation ([place])   . Port-A-Cath in place 03/28/2017  . Bradycardia  01/18/2017  . Obesity (BMI 30-39.9) 01/17/2017  . Pulmonary nodules 12/05/2016  . Compression fracture of L5 vertebra with delayed healing 11/01/2016  . Sacral insufficiency fracture with delayed healing 11/01/2016  . Sacral insufficiency fracture 09/05/2016  . Absence of bladder continence 07/21/2015  . Neuropathy associated with cancer ([place]) 06/02/2015  . Carcinoma of endometrium ([place]) 04/02/2015  . Gastro-esophageal reflux disease without esophagitis 02/15/2015  . Osteopenia 08/17/2014  . Hyperlipidemia 02/13/2014  . Kidney stones 10/27/2013  . CA skin, basal cell 06/30/2013  . Lumbar canal stenosis 06/30/2013  . Arthritis, degenerative 06/30/2013  . Neuralgia neuritis, sciatic nerve 06/30/2013  . Coronary artery disease 10/25/2012  . Dyslipidemia 10/25/2012  . NSVT (nonsustained ventricular tachycardia) ([place]) 10/25/2012  . Sick sinus syndrome ([place]) 10/25/2012  . Ischemic cardiomyopathy 10/22/2012  . Old anterior wall myocardial infarction 10/20/2012  . Hypertensive heart disease without CHF 10/20/2012   Susan [name] PT, DPT, GCS  Susan [name] 07/29/2019, 5:13 PM  [place] MAIN [facility] SERVICES [address] [place], [place], [address] Phone: [phone]   Fax:  [phone]  Name: Susan [name] MRN: [id number] Date of Birth: [birth year]

## 2019-07-31 ENCOUNTER — Other Ambulatory Visit: Payer: Self-pay

## 2019-07-31 ENCOUNTER — Ambulatory Visit: Payer: Medicare PPO

## 2019-07-31 DIAGNOSIS — R2681 Unsteadiness on feet: Secondary | ICD-10-CM | POA: Diagnosis not present

## 2019-07-31 DIAGNOSIS — M6281 Muscle weakness (generalized): Secondary | ICD-10-CM

## 2019-07-31 NOTE — Therapy (Signed)
[place] MAIN [facility] SERVICES [address] [place], [place], [address] Phone: [phone]   Fax:  [phone]  Physical Therapy Treatment  Patient Details  Name: Susan [name] MRN: [id number] Date of Birth: [birth year] Referring Provider (PT): Dr. Melrose Nakayama   Encounter Date: 07/31/2019  PT End of Session - 07/31/19 1604    Visit Number  11    Number of Visits  25    Date for PT Re-Evaluation  09/16/19    Authorization Type  eval 06/24/19, PN: 07/29/19    PT Start Time  1600    PT Stop Time  1645    PT Time Calculation (min)  45 min    Equipment Utilized During Treatment  Gait belt    Activity Tolerance  Patient tolerated treatment well    Behavior During Therapy  [facility] for tasks assessed/performed       Past Medical History:  Diagnosis Date  . Bradycardia   . Chronic back pain   . Coronary artery disease    a. s/p STEMI in 2014 with DES to LAD with staged PCI/DES placement to LCx  . Echoencephalogram abnormality   . Heart murmur   . History of Holter monitoring   . Hyperlipidemia   . Hypertension   . Myocardial infarction ([place])   . Neuromuscular disorder (HCC)    neuropathy  . NSVT (nonsustained ventricular tachycardia) ([place])   . Personal history of chemotherapy    ENDOMETRIAL CA  . Personal history of radiation therapy    ENDOMETRIAL CA  . Uterine cancer ([place])    serrous    Past Surgical History:  Procedure Laterality Date  . ABDOMINAL HYSTERECTOMY    . BREAST EXCISIONAL BIOPSY Right 1956   NEG  . CARDIOVERSION N/A 07/27/2017   Procedure: CARDIOVERSION;  Surgeon: Skeet Latch, MD;  Location: [place];  Service: Cardiovascular;  Laterality: N/A;  . LEFT HEART CATHETERIZATION WITH CORONARY ANGIOGRAM  10/19/2012   Procedure: LEFT HEART CATHETERIZATION WITH CORONARY ANGIOGRAM;  Surgeon: Troy Sine, MD;  Location: [facility] CATH LAB;  Service: Cardiovascular;;  . PERCUTANEOUS CORONARY STENT INTERVENTION (PCI-S)  10/19/2012    Procedure: PERCUTANEOUS CORONARY STENT INTERVENTION (PCI-S);  Surgeon: Troy Sine, MD;  Location: [facility] CATH LAB;  Service: Cardiovascular;;  . PERCUTANEOUS CORONARY STENT INTERVENTION (PCI-S) N/A 10/23/2012   Procedure: PERCUTANEOUS CORONARY STENT INTERVENTION (PCI-S);  Surgeon: Troy Sine, MD;  Location: [facility] C[facility] LAB;  Service: Cardiovascular;  Laterality: N/A;  . PERIPHERAL VASCULAR CATHETERIZATION N/A 05/05/2015   Procedure: Glori Luis Cath Insertion;  Surgeon: Algernon Huxley, MD;  Location: [place] CV LAB;  Service: Cardiovascular;  Laterality: N/A;  . PORTA CATH REMOVAL N/A 04/05/2017   Procedure: PORTA CATH REMOVAL;  Surgeon: Algernon Huxley, MD;  Location: [place] CV LAB;  Service: Cardiovascular;  Laterality: N/A;    There were no vitals filed for this visit.  Subjective Assessment - 07/31/19 1603    Subjective  Patient reports her back pain is unchanged at this time. No falls or LOB since last session. No specific questions or concerns. She is feeling more confident with her balance since starting therapy.    Pertinent History  Pt reports imbalance related to chemo-induced neuropathy which started 4 years ago. Neuropathy occurs in stocking distribution from knees to feet. Symptoms have been unchanged since that time. She has had one fall in the last 6 months. Denies dizziness or vertigo. No heachaches/migraines. She has a history of chronic back pain and  has had a bilateral nerve ablation. Her husband passed away 4 weeks ago after contracting COVID.    How long can you stand comfortably?  5 minutes    How long can you walk comfortably?  5 or 10 minutes    Patient Stated Goals  Strengthen her back muscles to decrease pain and improve her balance    Currently in Pain?  Yes    Pain Score  5     Pain Location  Back    Pain Orientation  Other (Comment)   Center near spine   Pain Descriptors / Indicators  Stabbing    Pain Type  Chronic pain    Pain Onset  More than a month ago    Pain  Frequency  Constant           TREATMENT   Ther-ex NuStep L2-4 x 5 minutes for warm-up during history (4 minutes unbilled); Quantum leg press 100# 2 x 20; Quantum heel raises 90# 2 x 20;  6" alternating step-ups without UE support x 10 BLE;   Seated marches with 4# ankle weights x 20 BLE; Seated LAQ with 4# ankle weights x 20 BLE; Seated clams with green tband resistance x 20 BLE; Seated adductor squeezes with manual resistance x 20 BLE;    Neuromuscular Re-education 6" alternating step taps x 10 BLE; Airex 6" alternating step taps x 10 BLE; 4 square stepping over canes CW/CCW x 2 each direction;  1/2 foam roll (round side up) A/P balance x 30s; 1/2 foam roll (round side up) tandem balance alternating forward LE x 30s each; 1/2 foam roll (flat side up) A/P balance x 30s; 1/2 foam roll (flat side up) A/P heel/toe rocks x 10 each direction;   Pt educated throughout session about proper posture and technique with exercises. Improved exercise technique, movement at target joints, use of target muscles after min to mod verbal, visual, tactile cues.   Pt demonstrates excellent motivation during session today. She is able to complete all exercises as instructed with intermittent seated rest breaks due to fatigue. Introduced 4 square stepping pattern and pt struggles with backwards stepping. She is able to perform 2 sets of the leg press today at 100# and increase resistance to 90# with the heel raises. She continues to struggle with balance on unstable surfaces such as Airex pad today. Pt encouraged tofollow-up as scheduled.Pt will benefit from PT services to address deficits in strength, balance, and mobility in order to return to full function at home.                       PT Short Term Goals - 07/29/19 1452      PT SHORT TERM GOAL #1   Title  Pt will be independent with home exercise program in order to improve balance and leg strength to  decrease fall risk and improve function at home    Baseline  07/29/19: Pt reports that she performs "every other day"    Time  6    Period  Weeks    Status  On-going    Target Date  08/06/19        PT Long Term Goals - 07/29/19 1453      PT LONG TERM GOAL #1   Title  Pt will improve BERG to at least 50/56 in order to demonstrate clinically significant improvement in balance.    Baseline  06/24/19: 43/56; 07/29/19: 47/56 (achieved initial goal of 3 point change)  Time  12    Period  Weeks    Status  Revised    Target Date  09/16/19      PT LONG TERM GOAL #2   Title  Pt will improve ABC by at least 13% in order to demonstrate clinically significant improvement in balance confidence.    Baseline  06/24/19: 35%; 07/29/19: 55.625%    Time  12    Period  Weeks    Status  Achieved      PT LONG TERM GOAL #3   Title  Pt will increase self-selected 10MWT by at least 0.13 m/s in order to demonstrate clinically significant improvement in community ambulation.    Baseline  06/24/19: 14.3s = 0.70 m/s; 07/29/19: 11.5s = 0.87 m/s;    Time  12    Period  Weeks    Status  Achieved      PT LONG TERM GOAL #4   Title  Pt will improve ABC to greater than 67% in order to demonstrate clinically significant improvement in balance confidence.    Baseline  07/29/19: 55.625%    Time  12    Period  Weeks    Status  New    Target Date  09/16/19            Plan - 07/31/19 1604    Clinical Impression Statement  Pt demonstrates excellent motivation during session today. She is able to complete all exercises as instructed with intermittent seated rest breaks due to fatigue. Introduced 4 square stepping pattern and pt struggles with backwards stepping. She is able to perform 2 sets of the leg press today at 100# and increase resistance to 90# with the heel raises. She continues to struggle with balance on unstable surfaces such as Airex pad today. Pt encouraged to follow-up as scheduled. Pt will benefit from PT  services to address deficits in strength, balance, and mobility in order to return to full function at home.    Personal Factors and Comorbidities  Age;Comorbidity 3+;Past/Current Experience;Time since onset of injury/illness/exacerbation    Comorbidities  Neuropathy, chronic back pain, MI, OA, CAD, HTN    Examination-Activity Limitations  Bend;Locomotion Level;Lift;Stairs;Stand    Examination-Participation Restrictions  Community Activity;Laundry;Yard Work    Stability/Clinical Decision Making  Unstable/Unpredictable    Rehab Potential  Fair    PT Frequency  2x / week    PT Duration  12 weeks    PT Treatment/Interventions  ADLs/Self Care Home Management;Aquatic Therapy;Biofeedback;Canalith Repostioning;Cryotherapy;Electrical Stimulation;Iontophoresis 4mg /ml Dexamethasone;Moist Heat;Traction;Ultrasound;DME Instruction;Gait training;Stair training;Functional mobility training;Therapeutic activities;Therapeutic exercise;Balance training;Neuromuscular re-education;Cognitive remediation;Patient/family education;Manual techniques;Passive range of motion;Dry needling;Vestibular;Spinal Manipulations;Joint Manipulations    PT Next Visit Plan  Continue with strength and balance exercises during session.    PT Home Exercise Plan  Medbridge Access Code: [email or web address]    Consulted and Agree with Plan of Care  Patient       Patient will benefit from skilled therapeutic intervention in order to improve the following deficits and impairments:  Abnormal gait, Decreased activity tolerance, Decreased balance, Decreased mobility, Decreased strength, Difficulty walking, Impaired sensation, Pain  Visit Diagnosis: Unsteadiness on feet  Muscle weakness (generalized)     Problem List Patient Active Problem List   Diagnosis Date Noted  . Chemotherapy-induced neuropathy ([place]) 04/17/2018  . Neuropathy 04/17/2018  . Chemotherapy-induced peripheral neuropathy ([place]) 03/01/2018  . Persistent atrial fibrillation ([place])    . Port-A-Cath in place 03/28/2017  . Bradycardia 01/18/2017  . Obesity (BMI 30-39.9) 01/17/2017  . Pulmonary nodules 12/05/2016  . Compression  fracture of L5 vertebra with delayed healing 11/01/2016  . Sacral insufficiency fracture with delayed healing 11/01/2016  . Sacral insufficiency fracture 09/05/2016  . Absence of bladder continence 07/21/2015  . Neuropathy associated with cancer ([place]) 06/02/2015  . Carcinoma of endometrium ([place]) 04/02/2015  . Gastro-esophageal reflux disease without esophagitis 02/15/2015  . Osteopenia 08/17/2014  . Hyperlipidemia 02/13/2014  . Kidney stones 10/27/2013  . CA skin, basal cell 06/30/2013  . Lumbar canal stenosis 06/30/2013  . Arthritis, degenerative 06/30/2013  . Neuralgia neuritis, sciatic nerve 06/30/2013  . Coronary artery disease 10/25/2012  . Dyslipidemia 10/25/2012  . NSVT (nonsustained ventricular tachycardia) ([place]) 10/25/2012  . Sick sinus syndrome ([place]) 10/25/2012  . Ischemic cardiomyopathy 10/22/2012  . Old anterior wall myocardial infarction 10/20/2012  . Hypertensive heart disease without CHF 10/20/2012   Lyndel Safe Raaga Maeder PT, DPT, GCS  Tacari Repass 07/31/2019, 4:50 PM  [place] MAIN [facility] SERVICES [address] [place], [place], [address] Phone: [phone]   Fax:  [phone]  Name: HAVANNAH FORMBY MRN: [id number] Date of Birth: 1934/10/24

## 2019-08-04 ENCOUNTER — Ambulatory Visit (INDEPENDENT_AMBULATORY_CARE_PROVIDER_SITE_OTHER): Payer: Medicare PPO | Admitting: Urology

## 2019-08-04 ENCOUNTER — Other Ambulatory Visit: Payer: Self-pay

## 2019-08-04 ENCOUNTER — Encounter: Payer: Self-pay | Admitting: Urology

## 2019-08-04 VITALS — BP 123/69 | HR 76 | Ht 64.5 in | Wt 211.0 lb

## 2019-08-04 DIAGNOSIS — N3946 Mixed incontinence: Secondary | ICD-10-CM

## 2019-08-04 DIAGNOSIS — R350 Frequency of micturition: Secondary | ICD-10-CM

## 2019-08-04 LAB — URINALYSIS, COMPLETE
Bilirubin, UA: NEGATIVE
Glucose, UA: NEGATIVE
Ketones, UA: NEGATIVE
Nitrite, UA: NEGATIVE
Protein,UA: NEGATIVE
Specific Gravity, UA: 1.025 (ref 1.005–1.030)
Urobilinogen, Ur: 0.2 mg/dL (ref 0.2–1.0)
pH, UA: 5 (ref 5.0–7.5)

## 2019-08-04 LAB — MICROSCOPIC EXAMINATION

## 2019-08-04 LAB — BLADDER SCAN AMB NON-IMAGING

## 2019-08-04 MED ORDER — CIPROFLOXACIN HCL 250 MG PO TABS: ORAL_TABLET | ORAL | 0 refills | Status: DC

## 2019-08-04 NOTE — Progress Notes (Signed)
08/04/2019 10:36 AM   Susan [name] 1934/11/28 [id number]  Referring provider: [name], MD [phone] [place],   [address]  Chief Complaint  Patient presents with  . Follow-up    bladder issues from radiation, discuss injections    HPI: I was consulted to assess the patient urinary incontinence which I believe worsened after her radiation last summer. She may have had a little bit of stress incontinence prior. Now she can leak with coughing and sneezing and bending and lifting if her bladder is full. Her primary symptom is urge incontinence triggered by sitting to standing position and especially worse in the morning with foot on the floor syndrome. She does not have bedwetting. She now wears 3-4 pads a day moderately wet but soaked in the morning  She voids every 1-2 hours worse after taking Lasix. She has no nocturia. She had a hysterectomy followed by chemotherapy followed by radiation for endometrial cancer 18 months ago  Well supported bladder neck with a mild positive cough test  The patient has mixed incontinence were primarily urgency incontinence with typical triggers.   If the patient's stress incontinence was ever treated a urethral injectable   The patient failed Myrbetriq and had diarrhea on Toviaz. She had sensory urgency on urodynamics with a capacity of 260 mL. She has severe leakage with bladder overactivity. Her cough leak point pressure 200 mL was 24 cm of water with moderate leakage. Cystoscopy was within normal limits.The patient felt the radiation made things worse.  The patient has mixed incontinence. 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 The risks are a little bit higher for erosion with injectable recognizing limited data. The role of percutaneous tibial nerve stimulation was discussed.  July 2018:The my index of suspicion is high that the radiation  caused her bladder become more overactive. She wants to think about percutaneous tibial nerve stimulation. I did mention Botox which is less ideal arguably in the radiation setting.  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  She is on oxybutynin 15 mg a day and it has reduced her leaking when she goes from a sitting to standing position and she has no nocturia.  She still uses multiple pads a day for 5 moderately wet or greater.  She can leak without awareness sitting in the chair.  She failed percutaneous tibial nerve stimulation  Treatment options are limited.  She had a normal cystoscopy previously.  I talked her about Botox.  She may be an increased risk of retention or bleeding and may have worsened efficacy recognizing the data.  Patient and I spoke about Botox with my usual template and she just would rather live with the symptoms and try oxybutynin ER 10 mg with 90 tablets and 3 refills and see in a year.  She would like to stop her Eliquis each time.  She thinks the oxybutynin helps and she is hoping that the lower dose will give less side effects.  Were not going to pursue InterStim.  Increased risks and lack of efficacy especially with radiation discussed.  I do not think she should have a urethral injectable either.  Today Incontinence worse.  Frequency stable.  Clinically not infected.  She has gone to [facility] gynecology and had urodynamics.  They mention the pessary but she does not have prolapse symptoms.  She is scheduled for a bulking agent.  Should  they stopped her oxybutynin they put her back on mirabegron on not helping.  She currently leaks a small amount with coughing sneezing.  Still high-volume urge incontinence triggered from sitting to standing position.  Soaking 5 pads a day.  Still on blood thinner.  Unfortunate her husband died from Covid and she is exhausted.  She can drive but not on the highway.  She has neuropathy.  Very lengthy  discussion.  I went over bulking treatment in full detail recognizing the chance of her helping her overactive bladder is 50% or less.  We went over Botox briefly.  She need to stop her blood thinner for all therapies.  She is reaching the end of her treatment choices.    PMH: Past Medical History:  Diagnosis Date  . Bradycardia   . Chronic back pain   . Coronary artery disease    a. s/p STEMI in 2014 with DES to LAD with staged PCI/DES placement to LCx  . Echoencephalogram abnormality   . Heart murmur   . History of Holter monitoring   . Hyperlipidemia   . Hypertension   . Myocardial infarction ([place])   . Neuromuscular disorder (HCC)    neuropathy  . NSVT (nonsustained ventricular tachycardia) ([place])   . Personal history of chemotherapy    ENDOMETRIAL CA  . Personal history of radiation therapy    ENDOMETRIAL CA  . Uterine cancer ([place])    serrous    Surgical History: Past Surgical History:  Procedure Laterality Date  . ABDOMINAL HYSTERECTOMY    . BREAST EXCISIONAL BIOPSY Right 1956   NEG  . CARDIOVERSION N/A 07/27/2017   Procedure: CARDIOVERSION;  Surgeon: Skeet Latch, MD;  Location: [place];  Service: Cardiovascular;  Laterality: N/A;  . LEFT HEART CATHETERIZATION WITH CORONARY ANGIOGRAM  10/19/2012   Procedure: LEFT HEART CATHETERIZATION WITH CORONARY ANGIOGRAM;  Surgeon: Troy Sine, MD;  Location: [facility] CATH LAB;  Service: Cardiovascular;;  . PERCUTANEOUS CORONARY STENT INTERVENTION (PCI-S)  10/19/2012   Procedure: PERCUTANEOUS CORONARY STENT INTERVENTION (PCI-S);  Surgeon: Troy Sine, MD;  Location[facility] CATH LAB;  Service: Cardiovascular;;  . PERCUTANEOUS CORONARY STENT INTERVENTION (PCI-S) N/A 10/23/2012   Procedure: PERCUTANEOUS CORONARY STENT INTERVENTION (PCI-S);  Surgeon: Troy Sine, MD;  Location: [facility] CATH LAB;  Service: Cardiovascular;  Laterality: N/A;  . PERIPHERAL VASCULAR CATHETERIZATION N/A 05/05/2015   Procedure: Glori Luis Cath Insertion;  Surgeon:  Algernon Huxley, MD;  Location: [place] CV LAB;  Service: Cardiovascular;  Laterality: N/A;  . PORTA CATH REMOVAL N/A 04/05/2017   Procedure: PORTA CATH REMOVAL;  Surgeon: Algernon Huxley, MD;  Location: [facility] CV LAB;  Service: Cardiovascular;  Laterality: N/A;    Home Medications:  Allergies as of 08/04/2019      Reactions   Ace Inhibitors Cough   Penicillins Rash, Other (See Comments)   Has patient had a PCN reaction causing immediate rash, facial/tongue/throat swelling, SOB or lightheadedness with hypotension: No Has patient had a PCN reaction causing severe rash involving mucus membranes or skin necrosis: No Has patient had a PCN reaction that required hospitalization: No Has patient had a PCN reaction occurring within the last 10 years: No If all of the above answers are "NO", then may proceed with Cephalosporin use.      Medication List       Accurate as of Aug 04, 2019 10:36 AM. If you have any questions, ask your nurse or doctor.        STOP taking these  medications   pregabalin 25 MG capsule Commonly known as: LYRICA Stopped by: Reece Packer, MD     TAKE these medications   acetaminophen 650 MG CR tablet Commonly known as: TYLENOL Take 650 mg by mouth every 8 (eight) hours as needed for pain.   aspirin EC 81 MG tablet Take 81 mg by mouth daily.   atorvastatin 80 MG tablet Commonly known as: LIPITOR TAKE 1 TABLET (80 MG TOTAL) BY MOUTH DAILY AT 6 PM.   Eliquis 5 MG Tabs tablet Generic drug: apixaban TAKE 1 TABLET BY MOUTH TWICE A DAY   ezetimibe 10 MG tablet Commonly known as: ZETIA TAKE 1 TABLET BY MOUTH EVERY DAY   fluticasone 50 MCG/ACT nasal spray Commonly known as: FLONASE SPRAY 2 SPRAYS INTO EACH NOSTRIL EVERY DAY   furosemide 20 MG tablet Commonly known as: LASIX Take 20 mg by mouth daily.   isosorbide mononitrate 30 MG 24 hr tablet Commonly known as: IMDUR Take 1 tablet (30 mg total) by mouth daily.   loperamide 2 MG  tablet Commonly known as: IMODIUM A-D Take 2 mg by mouth 4 (four) times daily as needed for diarrhea or loose stools.   losartan 25 MG tablet Commonly known as: COZAAR TAKE 2 TABLETS BY MOUTH EVERY DAY   mirabegron ER 50 MG Tb24 tablet Commonly known as: MYRBETRIQ Take 50 mg by mouth daily.   multivitamin with minerals Tabs tablet Take 1 tablet by mouth daily.   Nitrostat 0.4 MG SL tablet Generic drug: nitroGLYCERIN PLACE 1 TABLET UNDER THE TOUGUE EVERY 5 MINUTES FOR UP TO 3 DOSES AS NEEDED FOR CHEST PAIN   omeprazole 20 MG capsule Commonly known as: PRILOSEC Take 20 mg by mouth daily.   tiZANidine 4 MG tablet Commonly known as: Zanaflex Take 1 tablet (4 mg total) by mouth at bedtime as needed for muscle spasms.   vitamin B-12 1000 MCG tablet Commonly known as: CYANOCOBALAMIN Take 1,000 mcg by mouth daily.       Allergies:  Allergies  Allergen Reactions  . Ace Inhibitors Cough  . Penicillins Rash and Other (See Comments)    Has patient had a PCN reaction causing immediate rash, facial/tongue/throat swelling, SOB or lightheadedness with hypotension: No Has patient had a PCN reaction causing severe rash involving mucus membranes or skin necrosis: No Has patient had a PCN reaction that required hospitalization: No Has patient had a PCN reaction occurring within the last 10 years: No If all of the above answers are "NO", then may proceed with Cephalosporin use.     Family History: Family History  Problem Relation Age of Onset  . Heart disease Mother   . Breast cancer Mother 99  . Heart disease Father   . Diabetes Father   . Diabetes Brother   . Stroke Brother   . Kidney cancer Neg Hx   . Bladder Cancer Neg Hx     Social History:  reports that she quit smoking about 45 years ago. She has never used smokeless tobacco. She reports current alcohol use of about 1.0 standard drinks of alcohol per week. She reports that she does not use drugs.  ROS:                                         Physical Exam: BP 123/69   Pulse 76   Ht 5' 4.5" (1.638 m)   Wt  211 lb (95.7 kg)   BMI 35.66 kg/m   Constitutional:  Alert and oriented, No acute distress.   Laboratory Data: Lab Results  Component Value Date   WBC 5.6 02/26/2019   HGB 12.6 02/26/2019   HCT 38.7 02/26/2019   MCV 93.7 02/26/2019   PLT 213 02/26/2019    Lab Results  Component Value Date   CREATININE 1.06 (H) 02/26/2019    No results found for: PSA  No results found for: TESTOSTERONE  Lab Results  Component Value Date   HGBA1C 5.7 (H) 10/19/2012    Urinalysis    Component Value Date/Time   COLORURINE YELLOW (A) 02/20/2018 0905   APPEARANCEUR CLOUDY (A) 02/20/2018 0905   APPEARANCEUR Clear 08/13/2017 1433   LABSPEC 1.013 02/20/2018 0905   PHURINE 5.0 02/20/2018 0905   GLUCOSEU NEGATIVE 02/20/2018 0905   HGBUR MODERATE (A) 02/20/2018 0905   BILIRUBINUR NEGATIVE 02/20/2018 0905   BILIRUBINUR Negative 08/13/2017 1433   KETONESUR NEGATIVE 02/20/2018 0905   PROTEINUR 30 (A) 02/20/2018 0905   NITRITE NEGATIVE 02/20/2018 0905   LEUKOCYTESUR LARGE (A) 02/20/2018 0905   LEUKOCYTESUR Negative 08/13/2017 1433    Pertinent Imaging:   Assessment & Plan: The patient is getting near the end of her treatment algorithm and would like to try the bulking agent.  I gave her 3-day prescription of ciprofloxacin.  She will likely have a done in June after she gets back from a 2-week trip in [place].  She has gone through a lot especially with her husband's death in 06-16-2022.  I will make sure I do a good thorough cystoscopy again prior to the injection.  Rare risk of erosion or infection in radiated tissue discussed.  We will need to call Dr. Claiborne Billings her cardiologist in [place] to make certain she can stop her blood thinner 5 days previous and she said normally she does  1. Mixed incontinence  - Urinalysis, Complete   No follow-ups on file.  Reece Packer,  MD  [place] [address] [place], [place] [address] [phone]

## 2019-08-05 ENCOUNTER — Ambulatory Visit: Payer: Medicare PPO

## 2019-08-05 DIAGNOSIS — M6281 Muscle weakness (generalized): Secondary | ICD-10-CM

## 2019-08-05 DIAGNOSIS — R2681 Unsteadiness on feet: Secondary | ICD-10-CM | POA: Diagnosis not present

## 2019-08-05 NOTE — Therapy (Addendum)
[place] MAIN [facility] SERVICES [address] [place], [place], [address] Phone: [phone]   Fax:  [phone]  Physical Therapy Treatment  Patient Details  Name: Susan [name] MRN: [id number] Date of Birth: Oct 29, 1934 Referring Provider (PT): Dr. Melrose Nakayama   Encounter Date: 08/05/2019  PT End of Session - 08/05/19 1431    Visit Number  12    Number of Visits  25    Date for PT Re-Evaluation  09/16/19    Authorization Type  eval 06/24/19, PN: 07/29/19    PT Start Time  1432    PT Stop Time  1515    PT Time Calculation (min)  43 min    Equipment Utilized During Treatment  Gait belt    Activity Tolerance  Patient tolerated treatment well    Behavior During Therapy  [facility] for tasks assessed/performed       Past Medical History:  Diagnosis Date  . Bradycardia   . Chronic back pain   . Coronary artery disease    a. s/p STEMI in 2014 with DES to LAD with staged PCI/DES placement to LCx  . Echoencephalogram abnormality   . Heart murmur   . History of Holter monitoring   . Hyperlipidemia   . Hypertension   . Myocardial infarction ([place])   . Neuromuscular disorder (HCC)    neuropathy  . NSVT (nonsustained ventricular tachycardia) ([place])   . Personal history of chemotherapy    ENDOMETRIAL CA  . Personal history of radiation therapy    ENDOMETRIAL CA  . Uterine cancer ([place])    serrous    Past Surgical History:  Procedure Laterality Date  . ABDOMINAL HYSTERECTOMY    . BREAST EXCISIONAL BIOPSY Right 1956   NEG  . CARDIOVERSION N/A 07/27/2017   Procedure: CARDIOVERSION;  Surgeon: Skeet Latch, MD;  Location: [place];  Service: Cardiovascular;  Laterality: N/A;  . LEFT HEART CATHETERIZATION WITH CORONARY ANGIOGRAM  10/19/2012   Procedure: LEFT HEART CATHETERIZATION WITH CORONARY ANGIOGRAM;  Surgeon: Troy Sine, MD;  Location: [facility] CATH LAB;  Service: Cardiovascular;;  . PERCUTANEOUS CORONARY STENT INTERVENTION (PCI-S)  10/19/2012    Procedure: PERCUTANEOUS CORONARY STENT INTERVENTION (PCI-S);  Surgeon: Troy Sine, MD;  Location: [facility] CATH LAB;  Service: Cardiovascular;;  . PERCUTANEOUS CORONARY STENT INTERVENTION (PCI-S) N/A 10/23/2012   Procedure: PERCUTANEOUS CORONARY STENT INTERVENTION (PCI-S);  Surgeon: Troy Sine, MD;  Location: [facility] CATH LAB;  Service: Cardiovascular;  Laterality: N/A;  . PERIPHERAL VASCULAR CATHETERIZATION N/A 05/05/2015   Procedure: Glori Luis Cath Insertion;  Surgeon: Algernon Huxley, MD;  Location: [place] CV LAB;  Service: Cardiovascular;  Laterality: N/A;  . PORTA CATH REMOVAL N/A 04/05/2017   Procedure: PORTA CATH REMOVAL;  Surgeon: Algernon Huxley, MD;  Location: [place] CV LAB;  Service: Cardiovascular;  Laterality: N/A;    There were no vitals filed for this visit.  Subjective Assessment - 08/05/19 1430    Subjective  Patient reports her back pain is unchanged at this time. No falls or LOB since last session. No specific questions or concerns.    Pertinent History  Pt reports imbalance related to chemo-induced neuropathy which started 4 years ago. Neuropathy occurs in stocking distribution from knees to feet. Symptoms have been unchanged since that time. She has had one fall in the last 6 months. Denies dizziness or vertigo. No heachaches/migraines. She has a history of chronic back pain and has had a bilateral nerve ablation. Her husband passed away 4  weeks ago after contracting COVID.    How long can you stand comfortably?  5 minutes    How long can you walk comfortably?  5 or 10 minutes    Patient Stated Goals  Strengthen her back muscles to decrease pain and improve her balance    Currently in Pain?  Yes    Pain Score  5     Pain Location  Back    Pain Orientation  Other (Comment)   Central near spine   Pain Descriptors / Indicators  Stabbing    Pain Type  Chronic pain    Pain Onset  More than a month ago    Pain Frequency  Constant         TREATMENT   Ther-ex Hooklying  SLR 2 x 10 BLE; Hooklying bridges 2 x 10; Seated clams with green tband resistance 2 x 20 BLE; Seated adductor squeezes with manual resistance 2 x 20 BLE;  Quantum leg press 105# 2 x 20; Quantum heel raises90# x 20;  6" alternating step-ups without UE support x 10 BLE; Seated LAQ with manual resistance with 2 x 20 BLE; Seated HS curls with red tband x 15 BLE;   Pt educated throughout session about proper posture and technique with exercises. Improved exercise technique, movement at target joints, use of target muscles after min to mod verbal, visual, tactile cues.   Pt demonstrates excellent motivation during session today. Session focused on strengthening today. Including some supine and hooklying exercises into her program. She is able to complete all exercises as instructed with intermittent seated rest breaks due to fatigue and to minimize back pain. She is able to increase  Pt encouraged tofollow-up as scheduled.Pt will benefit from PT services to address deficits in strength, balance, and mobility in order to return to full function at home.                         PT Short Term Goals - 07/29/19 1452      PT SHORT TERM GOAL #1   Title  Pt will be independent with home exercise program in order to improve balance and leg strength to decrease fall risk and improve function at home    Baseline  07/29/19: Pt reports that she performs "every other day"    Time  6    Period  Weeks    Status  On-going    Target Date  08/06/19        PT Long Term Goals - 07/29/19 1453      PT LONG TERM GOAL #1   Title  Pt will improve BERG to at least 50/56 in order to demonstrate clinically significant improvement in balance.    Baseline  06/24/19: 43/56; 07/29/19: 47/56 (achieved initial goal of 3 point change)    Time  12    Period  Weeks    Status  Revised    Target Date  09/16/19      PT LONG TERM GOAL #2   Title  Pt will improve ABC by at least 13% in order  to demonstrate clinically significant improvement in balance confidence.    Baseline  06/24/19: 35%; 07/29/19: 55.625%    Time  12    Period  Weeks    Status  Achieved      PT LONG TERM GOAL #3   Title  Pt will increase self-selected 10MWT by at least 0.13 m/s in order to demonstrate clinically significant  improvement in community ambulation.    Baseline  06/24/19: 14.3s = 0.70 m/s; 07/29/19: 11.5s = 0.87 m/s;    Time  12    Period  Weeks    Status  Achieved      PT LONG TERM GOAL #4   Title  Pt will improve ABC to greater than 67% in order to demonstrate clinically significant improvement in balance confidence.    Baseline  07/29/19: 55.625%    Time  12    Period  Weeks    Status  New    Target Date  09/16/19            Plan - 08/05/19 1436    Personal Factors and Comorbidities  Age;Comorbidity 3+;Past/Current Experience;Time since onset of injury/illness/exacerbation    Comorbidities  Neuropathy, chronic back pain, MI, OA, CAD, HTN    Examination-Activity Limitations  Bend;Locomotion Level;Lift;Stairs;Stand    Examination-Participation Restrictions  Community Activity;Laundry;Yard Work    Stability/Clinical Decision Making  Unstable/Unpredictable    Rehab Potential  Fair    PT Frequency  2x / week    PT Duration  12 weeks    PT Treatment/Interventions  ADLs/Self Care Home Management;Aquatic Therapy;Biofeedback;Canalith Repostioning;Cryotherapy;Electrical Stimulation;Iontophoresis 4mg /ml Dexamethasone;Moist Heat;Traction;Ultrasound;DME Instruction;Gait training;Stair training;Functional mobility training;Therapeutic activities;Therapeutic exercise;Balance training;Neuromuscular re-education;Cognitive remediation;Patient/family education;Manual techniques;Passive range of motion;Dry needling;Vestibular;Spinal Manipulations;Joint Manipulations    PT Next Visit Plan  Continue with strength and balance exercises during session.    PT Home Exercise Plan  Medbridge Access Code: [email or web address]     Consulted and Agree with Plan of Care  Patient       Patient will benefit from skilled therapeutic intervention in order to improve the following deficits and impairments:  Abnormal gait, Decreased activity tolerance, Decreased balance, Decreased mobility, Decreased strength, Difficulty walking, Impaired sensation, Pain  Visit Diagnosis: Unsteadiness on feet  Muscle weakness (generalized)     Problem List Patient Active Problem List   Diagnosis Date Noted  . Chemotherapy-induced neuropathy ([place]) 04/17/2018  . Neuropathy 04/17/2018  . Chemotherapy-induced peripheral neuropathy ([place]) 03/01/2018  . Persistent atrial fibrillation ([place])   . Port-A-Cath in place 03/28/2017  . Bradycardia 01/18/2017  . Obesity (BMI 30-39.9) 01/17/2017  . Pulmonary nodules 12/05/2016  . Compression fracture of L5 vertebra with delayed healing 11/01/2016  . Sacral insufficiency fracture with delayed healing 11/01/2016  . Sacral insufficiency fracture 09/05/2016  . Absence of bladder continence 07/21/2015  . Neuropathy associated with cancer ([place]) 06/02/2015  . Carcinoma of endometrium ([place]) 04/02/2015  . Gastro-esophageal reflux disease without esophagitis 02/15/2015  . Osteopenia 08/17/2014  . Hyperlipidemia 02/13/2014  . Kidney stones 10/27/2013  . CA skin, basal cell 06/30/2013  . Lumbar canal stenosis 06/30/2013  . Arthritis, degenerative 06/30/2013  . Neuralgia neuritis, sciatic nerve 06/30/2013  . Coronary artery disease 10/25/2012  . Dyslipidemia 10/25/2012  . NSVT (nonsustained ventricular tachycardia) ([place]) 10/25/2012  . Sick sinus syndrome ([place]) 10/25/2012  . Ischemic cardiomyopathy 10/22/2012  . Old anterior wall myocardial infarction 10/20/2012  . Hypertensive heart disease without CHF 10/20/2012   Lyndel Safe Diquan Kassis PT, DPT, GCS  Jovonte Commins 08/05/2019, 3:32 PM  [place] MAIN [facility] SERVICES [address] [place], [place], [address] Phone:  [phone]   Fax:  [phone]  Name: TAWNEE RYDZEWSKI MRN: [id number] Date of Birth: [birth year]

## 2019-08-06 LAB — URINE CULTURE

## 2019-08-06 NOTE — Addendum Note (Signed)
Addended by: Verlene Mayer A on: 08/06/2019 03:08 PM   Modules accepted: Orders

## 2019-08-07 ENCOUNTER — Other Ambulatory Visit: Payer: Self-pay

## 2019-08-07 ENCOUNTER — Ambulatory Visit: Payer: Medicare PPO

## 2019-08-07 DIAGNOSIS — R2681 Unsteadiness on feet: Secondary | ICD-10-CM | POA: Diagnosis not present

## 2019-08-07 DIAGNOSIS — M6281 Muscle weakness (generalized): Secondary | ICD-10-CM

## 2019-08-07 NOTE — Therapy (Signed)
[place] MAIN [facility] SERVICES [address] [place], [place], [address] Phone: [phone]   Fax:  [phone]  Physical Therapy Treatment  Patient Details  Name: Susan [name] Date of Birth: [birth year] Referring Provider (PT): Dr. Melrose Nakayama   Encounter Date: 08/07/2019  PT End of Session - 08/07/19 1649    Visit Number  13    Number of Visits  25    Date for PT Re-Evaluation  09/16/19    Authorization Type  eval 06/24/19, PN: 07/29/19    PT Start Time  1644    PT Stop Time  1729    PT Time Calculation (min)  45 min    Equipment Utilized During Treatment  Gait belt    Activity Tolerance  Patient tolerated treatment well    Behavior During Therapy  [facility] for tasks assessed/performed       Past Medical History:  Diagnosis Date  . Bradycardia   . Chronic back pain   . Coronary artery disease    a. s/p STEMI in 2014 with DES to LAD with staged PCI/DES placement to LCx  . Echoencephalogram abnormality   . Heart murmur   . History of Holter monitoring   . Hyperlipidemia   . Hypertension   . Myocardial infarction ([place])   . Neuromuscular disorder (HCC)    neuropathy  . NSVT (nonsustained ventricular tachycardia) ([place])   . Personal history of chemotherapy    ENDOMETRIAL CA  . Personal history of radiation therapy    ENDOMETRIAL CA  . Uterine cancer ([place])    serrous    Past Surgical History:  Procedure Laterality Date  . ABDOMINAL HYSTERECTOMY    . BREAST EXCISIONAL BIOPSY Right 1956   NEG  . CARDIOVERSION N/A 07/27/2017   Procedure: CARDIOVERSION;  Surgeon: Skeet Latch, MD;  Location: [place];  Service: Cardiovascular;  Laterality: N/A;  . LEFT HEART CATHETERIZATION WITH CORONARY ANGIOGRAM  10/19/2012   Procedure: LEFT HEART CATHETERIZATION WITH CORONARY ANGIOGRAM;  Surgeon: Troy Sine, MD;  Location: [facility] CATH LAB;  Service: Cardiovascular;;  . PERCUTANEOUS CORONARY STENT INTERVENTION (PCI-S)  10/19/2012    Procedure: PERCUTANEOUS CORONARY STENT INTERVENTION (PCI-S);  Surgeon: Troy Sine, MD;  Location: [facility] CATH LAB;  Service: Cardiovascular;;  . PERCUTANEOUS CORONARY STENT INTERVENTION (PCI-S) N/A 10/23/2012   Procedure: PERCUTANEOUS CORONARY STENT INTERVENTION (PCI-S);  Surgeon: Troy Sine, MD;  [facility] C[facility] LAB;  Service: Cardiovascular;  Laterality: N/A;  . PERIPHERAL VASCULAR CATHETERIZATION N/A 05/05/2015   Procedure: Glori Luis Cath Insertion;  Surgeon: Algernon Huxley, MD;  Location: [place] CV LAB;  Service: Cardiovascular;  Laterality: N/A;  . PORTA CATH REMOVAL N/A 04/05/2017   Procedure: PORTA CATH REMOVAL;  Surgeon: Algernon Huxley, MD;  Location: [place] CV LAB;  Service: Cardiovascular;  Laterality: N/A;    There were no vitals filed for this visit.  Subjective Assessment - 08/07/19 1647    Subjective  Patient is wearing her AFO for her left foot today. Is wearing her compression socks today. Back pain is reduced today.    Pertinent History  Pt reports imbalance related to chemo-induced neuropathy which started 4 years ago. Neuropathy occurs in stocking distribution from knees to feet. Symptoms have been unchanged since that time. She has had one fall in the last 6 months. Denies dizziness or vertigo. No heachaches/migraines. She has a history of chronic back pain and has had a bilateral nerve ablation. Her husband passed away 4 weeks  ago after contracting COVID.    How long can you stand comfortably?  5 minutes    How long can you walk comfortably?  5 or 10 minutes    Patient Stated Goals  Strengthen her back muscles to decrease pain and improve her balance    Currently in Pain?  Yes    Pain Score  2     Pain Location  Back    Pain Orientation  Lower    Pain Descriptors / Indicators  Stabbing    Pain Type  Chronic pain    Pain Onset  More than a month ago    Pain Frequency  Constant           TREATMENT     Ther-ex  [place] fitness Lvl 3-4 for cardiovascular  challenge x 3 minutes RTB around ankles: lateral stepping with focus on neutral foot alignment 4x length of // bars RTB around ankles: "monster/[place] walks" 4x length of // bars  sit to stand from chair with decreasing UE support; intermittently performed between interventions Ambulate 40 ft with cane x 2 trials with cues for upright posture with improved foot clearance of LLE noted.  Neuromuscular Re-education  airex pad: one foot on airex pad one foot on dynadisc for modified tandem stance 2x 30 second holds each position airex pad 6" step sandwhich; lateral step up/down w/o UE support 10x each direction. Challenging for single limb stability bosu ball: round side up: forward modified lunges without UE support 10x each LE Orange hurdle: step over and back w/o UE support 8x each LE      Pt educated throughout session about proper posture and technique with exercises. Improved exercise technique, movement at target joints, use of target muscles after min to mod verbal, visual, tactile cues.    Patient presents with excellent motivation throughout physical therapy session. She demonstrates improved ankle righting reactions and stability in single limb stance. Use of AFO on LLE improves her foot clearance and ability to negotiate/clear obstacles with decreased compensatory mechanisms. Intermittent rest breaks continue to be required due to fatigue at this time. Pt will benefit from PT services to address deficits in strength, balance, and mobility in order to return to full function at home                   PT Education - 08/07/19 1648    Education Details  exercise technique, body mechanics    Person(s) Educated  Patient    Methods  Explanation;Demonstration;Tactile cues;Verbal cues    Comprehension  Verbalized understanding;Returned demonstration;Verbal cues required;Tactile cues required       PT Short Term Goals - 07/29/19 1452      PT SHORT TERM GOAL #1   Title   Pt will be independent with home exercise program in order to improve balance and leg strength to decrease fall risk and improve function at home    Baseline  07/29/19: Pt reports that she performs "every other day"    Time  6    Period  Weeks    Status  On-going    Target Date  08/06/19        PT Long Term Goals - 07/29/19 1453      PT LONG TERM GOAL #1   Title  Pt will improve BERG to at least 50/56 in order to demonstrate clinically significant improvement in balance.    Baseline  06/24/19: 43/56; 07/29/19: 47/56 (achieved initial goal of 3 point change)  Time  12    Period  Weeks    Status  Revised    Target Date  09/16/19      PT LONG TERM GOAL #2   Title  Pt will improve ABC by at least 13% in order to demonstrate clinically significant improvement in balance confidence.    Baseline  06/24/19: 35%; 07/29/19: 55.625%    Time  12    Period  Weeks    Status  Achieved      PT LONG TERM GOAL #3   Title  Pt will increase self-selected 10MWT by at least 0.13 m/s in order to demonstrate clinically significant improvement in community ambulation.    Baseline  06/24/19: 14.3s = 0.70 m/s; 07/29/19: 11.5s = 0.87 m/s;    Time  12    Period  Weeks    Status  Achieved      PT LONG TERM GOAL #4   Title  Pt will improve ABC to greater than 67% in order to demonstrate clinically significant improvement in balance confidence.    Baseline  07/29/19: 55.625%    Time  12    Period  Weeks    Status  New    Target Date  09/16/19            Plan - 08/07/19 1746    Clinical Impression Statement  Patient presents with excellent motivation throughout physical therapy session. She demonstrates improved ankle righting reactions and stability in single limb stance. Use of AFO on LLE improves her foot clearance and ability to negotiate/clear obstacles with decreased compensatory mechanisms. Intermittent rest breaks continue to be required due to fatigue at this time. Pt will benefit from PT services to  address deficits in strength, balance, and mobility in order to return to full function at home    Personal Factors and Comorbidities  Age;Comorbidity 3+;Past/Current Experience;Time since onset of injury/illness/exacerbation    Comorbidities  Neuropathy, chronic back pain, MI, OA, CAD, HTN    Examination-Activity Limitations  Bend;Locomotion Level;Lift;Stairs;Stand    Examination-Participation Restrictions  Community Activity;Laundry;Yard Work    Stability/Clinical Decision Making  Unstable/Unpredictable    Rehab Potential  Fair    PT Frequency  2x / week    PT Duration  12 weeks    PT Treatment/Interventions  ADLs/Self Care Home Management;Aquatic Therapy;Biofeedback;Canalith Repostioning;Cryotherapy;Electrical Stimulation;Iontophoresis 4mg /ml Dexamethasone;Moist Heat;Traction;Ultrasound;DME Instruction;Gait training;Stair training;Functional mobility training;Therapeutic activities;Therapeutic exercise;Balance training;Neuromuscular re-education;Cognitive remediation;Patient/family education;Manual techniques;Passive range of motion;Dry needling;Vestibular;Spinal Manipulations;Joint Manipulations    PT Next Visit Plan  Continue with strength and balance exercises during session.    PT Home Exercise Plan  Medbridge Access Code: [email or web address]    Consulted and Agree with Plan of Care  Patient       Patient will benefit from skilled therapeutic intervention in order to improve the following deficits and impairments:  Abnormal gait, Decreased activity tolerance, Decreased balance, Decreased mobility, Decreased strength, Difficulty walking, Impaired sensation, Pain  Visit Diagnosis: Unsteadiness on feet  Muscle weakness (generalized)     Problem List Patient Active Problem List   Diagnosis Date Noted  . Chemotherapy-induced neuropathy ([place]) 04/17/2018  . Neuropathy 04/17/2018  . Chemotherapy-induced peripheral neuropathy ([place]) 03/01/2018  . Persistent atrial fibrillation ([place])   .  Port-A-Cath in place 03/28/2017  . Bradycardia 01/18/2017  . Obesity (BMI 30-39.9) 01/17/2017  . Pulmonary nodules 12/05/2016  . Compression fracture of L5 vertebra with delayed healing 11/01/2016  . Sacral insufficiency fracture with delayed healing 11/01/2016  . Sacral insufficiency fracture 09/05/2016  . Absence  of bladder continence 07/21/2015  . Neuropathy associated with cancer ([place]) 06/02/2015  . Carcinoma of endometrium ([place]) 04/02/2015  . Gastro-esophageal reflux disease without esophagitis 02/15/2015  . Osteopenia 08/17/2014  . Hyperlipidemia 02/13/2014  . Kidney stones 10/27/2013  . CA skin, basal cell 06/30/2013  . Lumbar canal stenosis 06/30/2013  . Arthritis, degenerative 06/30/2013  . Neuralgia neuritis, sciatic nerve 06/30/2013  . Coronary artery disease 10/25/2012  . Dyslipidemia 10/25/2012  . NSVT (nonsustained ventricular tachycardia) ([place]) 10/25/2012  . Sick sinus syndrome ([place]) 10/25/2012  . Ischemic cardiomyopathy 10/22/2012  . Old anterior wall myocardial infarction 10/20/2012  . Hypertensive heart disease without CHF 10/20/2012   Susan [name], PT, DPT   08/07/2019, 5:48 PM  [place] MAIN [facility] SERVICES [address] [place], [place], [address] Phone: [phone]   Fax:  [phone]  Name: GRACYNN WONDERLY MRN: [id number] Date of Birth: 03/05/1935

## 2019-08-12 ENCOUNTER — Other Ambulatory Visit: Payer: Self-pay

## 2019-08-12 ENCOUNTER — Ambulatory Visit: Payer: Medicare PPO

## 2019-08-12 DIAGNOSIS — R2681 Unsteadiness on feet: Secondary | ICD-10-CM | POA: Diagnosis not present

## 2019-08-12 DIAGNOSIS — M6281 Muscle weakness (generalized): Secondary | ICD-10-CM

## 2019-08-12 NOTE — Therapy (Signed)
[place] MAIN [facility] SERVICES [address] [place], [place], [address] Phone: [phone]   Fax:  [phone]  Physical Therapy Treatment  Patient Details  Name: Susan [name] MRN: [id number] Date of Birth: April 03, 1934 Referring Provider (PT): Dr. Melrose Nakayama   Encounter Date: 08/12/2019  PT End of Session - 08/12/19 1449    Visit Number  14    Number of Visits  25    Date for PT Re-Evaluation  09/16/19    Authorization Type  eval 06/24/19, PN: 07/29/19    PT Start Time  1432    PT Stop Time  1515    PT Time Calculation (min)  43 min    Equipment Utilized During Treatment  Gait belt    Activity Tolerance  Patient tolerated treatment well    Behavior During Therapy  [facility] for tasks assessed/performed       Past Medical History:  Diagnosis Date  . Bradycardia   . Chronic back pain   . Coronary artery disease    a. s/p STEMI in 2014 with DES to LAD with staged PCI/DES placement to LCx  . Echoencephalogram abnormality   . Heart murmur   . History of Holter monitoring   . Hyperlipidemia   . Hypertension   . Myocardial infarction ([place])   . Neuromuscular disorder (HCC)    neuropathy  . NSVT (nonsustained ventricular tachycardia) ([place])   . Personal history of chemotherapy    ENDOMETRIAL CA  . Personal history of radiation therapy    ENDOMETRIAL CA  . Uterine cancer ([place])    serrous    Past Surgical History:  Procedure Laterality Date  . ABDOMINAL HYSTERECTOMY    . BREAST EXCISIONAL BIOPSY Right 1956   NEG  . CARDIOVERSION N/A 07/27/2017   Procedure: CARDIOVERSION;  Surgeon: Skeet Latch, MD;  Location: [place];  Service: Cardiovascular;  Laterality: N/A;  . LEFT HEART CATHETERIZATION WITH CORONARY ANGIOGRAM  10/19/2012   Procedure: LEFT HEART CATHETERIZATION WITH CORONARY ANGIOGRAM;  Surgeon: Troy Sine, MD;  Location: [facility] CATH LAB;  Service: Cardiovascular;;  . PERCUTANEOUS CORONARY STENT INTERVENTION (PCI-S)  10/19/2012   Procedure: PERCUTANEOUS CORONARY STENT INTERVENTION (PCI-S);  Surgeon: Troy Sine, MD;  Location: [facility] CATH LAB;  Service: Cardiovascular;;  . PERCUTANEOUS CORONARY STENT INTERVENTION (PCI-S) N/A 10/23/2012   Procedure: PERCUTANEOUS CORONARY STENT INTERVENTION (PCI-S);  Surgeon: Troy Sine, MD;  Location: [facility] CATH LAB;  Service: Cardiovascular;  Laterality: N/A;  . PERIPHERAL VASCULAR CATHETERIZATION N/A 05/05/2015   Procedure: Glori Luis Cath Insertion;  Surgeon: Algernon Huxley, MD;  Location: [place] CV LAB;  Service: Cardiovascular;  Laterality: N/A;  . PORTA CATH REMOVAL N/A 04/05/2017   Procedure: PORTA CATH REMOVAL;  Surgeon: Algernon Huxley, MD;  Location: [place] CV LAB;  Service: Cardiovascular;  Laterality: N/A;    There were no vitals filed for this visit.  Subjective Assessment - 08/12/19 1445    Subjective  Patient is wearing her AFO for her left foot today. Is wearing her compression socks today. No falls since last therapy session. No changes in health/medication. No specific questions or concerns at this time.    Pertinent History  Pt reports imbalance related to chemo-induced neuropathy which started 4 years ago. Neuropathy occurs in stocking distribution from knees to feet. Symptoms have been unchanged since that time. She has had one fall in the last 6 months. Denies dizziness or vertigo. No heachaches/migraines. She has a history of chronic back pain and  has had a bilateral nerve ablation. Her husband passed away 4 weeks ago after contracting COVID.    How long can you stand comfortably?  5 minutes    How long can you walk comfortably?  5 or 10 minutes    Patient Stated Goals  Strengthen her back muscles to decrease pain and improve her balance    Currently in Pain?  Yes    Pain Score  5     Pain Location  Back    Pain Orientation  Lower    Pain Descriptors / Indicators  Stabbing    Pain Type  Chronic pain    Pain Onset  More than a month ago    Pain Frequency  Constant             TREATMENT   Ther-ex NuStep L2-4 x 5 minutes for warm-up during history  Quantum BLE leg press 105# x 20, 115# x 20; Hooklying SLR 2 x 10 BLE; Hooklying bridges 2 x 10; Hooklying clams with manual resistance 2 x 20 BLE; Hooklying adductor squeezes with manual resistance 2 x 20 BLE; Sidelying straight leg hip abduction 2 x 10 BLE; 6" alternating step taps without UE support x 10 BLE; 6" alternating step-ups without UE support x 10 BLE;   Pt educated throughout session about proper posture and technique with exercises. Improved exercise technique, movement at target joints, use of target muscles after min to mod verbal, visual, tactile cues.   Pt demonstrates excellent motivation during session today. Session focused on strengthening today. Included some supine and hooklying exercises into her program today. She is able to complete all exercises as instructed with intermittent rest breaks due to fatigue and to minimize back pain. She is able to increase her resistance on the leg press today. Pt encouraged tofollow-up as scheduled.Pt will benefit from PT services to address deficits in strength, balance, and mobility in order to return to full function at home.                              PT Short Term Goals - 07/29/19 1452      PT SHORT TERM GOAL #1   Title  Pt will be independent with home exercise program in order to improve balance and leg strength to decrease fall risk and improve function at home    Baseline  07/29/19: Pt reports that she performs "every other day"    Time  6    Period  Weeks    Status  On-going    Target Date  08/06/19        PT Long Term Goals - 07/29/19 1453      PT LONG TERM GOAL #1   Title  Pt will improve BERG to at least 50/56 in order to demonstrate clinically significant improvement in balance.    Baseline  06/24/19: 43/56; 07/29/19: 47/56 (achieved initial goal of 3 point change)    Time  12     Period  Weeks    Status  Revised    Target Date  09/16/19      PT LONG TERM GOAL #2   Title  Pt will improve ABC by at least 13% in order to demonstrate clinically significant improvement in balance confidence.    Baseline  06/24/19: 35%; 07/29/19: 55.625%    Time  12    Period  Weeks    Status  Achieved  PT LONG TERM GOAL #3   Title  Pt will increase self-selected 10MWT by at least 0.13 m/s in order to demonstrate clinically significant improvement in community ambulation.    Baseline  06/24/19: 14.3s = 0.70 m/s; 07/29/19: 11.5s = 0.87 m/s;    Time  12    Period  Weeks    Status  Achieved      PT LONG TERM GOAL #4   Title  Pt will improve ABC to greater than 67% in order to demonstrate clinically significant improvement in balance confidence.    Baseline  07/29/19: 55.625%    Time  12    Period  Weeks    Status  New    Target Date  09/16/19            Plan - 08/12/19 1451    Clinical Impression Statement  Pt demonstrates excellent motivation during session today. Session focused on strengthening today. Included some supine and hooklying exercises into her program today. She is able to complete all exercises as instructed with intermittent rest breaks due to fatigue and to minimize back pain. She is able to increase her resistance on the leg press today. Pt encouraged to follow-up as scheduled. Pt will benefit from PT services to address deficits in strength, balance, and mobility in order to return to full function at home.    Personal Factors and Comorbidities  Age;Comorbidity 3+;Past/Current Experience;Time since onset of injury/illness/exacerbation    Comorbidities  Neuropathy, chronic back pain, MI, OA, CAD, HTN    Examination-Activity Limitations  Bend;Locomotion Level;Lift;Stairs;Stand    Examination-Participation Restrictions  Community Activity;Laundry;Yard Work    Stability/Clinical Decision Making  Unstable/Unpredictable    Rehab Potential  Fair    PT Frequency  2x /  week    PT Duration  12 weeks    PT Treatment/Interventions  ADLs/Self Care Home Management;Aquatic Therapy;Biofeedback;Canalith Repostioning;Cryotherapy;Electrical Stimulation;Iontophoresis 4mg /ml Dexamethasone;Moist Heat;Traction;Ultrasound;DME Instruction;Gait training;Stair training;Functional mobility training;Therapeutic activities;Therapeutic exercise;Balance training;Neuromuscular re-education;Cognitive remediation;Patient/family education;Manual techniques;Passive range of motion;Dry needling;Vestibular;Spinal Manipulations;Joint Manipulations    PT Next Visit Plan  Continue with strength and balance exercises during session.    PT Home Exercise Plan  Medbridge Access Code: [email or web address]    Consulted and Agree with Plan of Care  Patient       Patient will benefit from skilled therapeutic intervention in order to improve the following deficits and impairments:  Abnormal gait, Decreased activity tolerance, Decreased balance, Decreased mobility, Decreased strength, Difficulty walking, Impaired sensation, Pain  Visit Diagnosis: Unsteadiness on feet  Muscle weakness (generalized)     Problem List Patient Active Problem List   Diagnosis Date Noted  . Chemotherapy-induced neuropathy ([place]) 04/17/2018  . Neuropathy 04/17/2018  . Chemotherapy-induced peripheral neuropathy ([place]) 03/01/2018  . Persistent atrial fibrillation ([place])   . Port-A-Cath in place 03/28/2017  . Bradycardia 01/18/2017  . Obesity (BMI 30-39.9) 01/17/2017  . Pulmonary nodules 12/05/2016  . Compression fracture of L5 vertebra with delayed healing 11/01/2016  . Sacral insufficiency fracture with delayed healing 11/01/2016  . Sacral insufficiency fracture 09/05/2016  . Absence of bladder continence 07/21/2015  . Neuropathy associated with cancer ([place]) 06/02/2015  . Carcinoma of endometrium ([place]) 04/02/2015  . Gastro-esophageal reflux disease without esophagitis 02/15/2015  . Osteopenia 08/17/2014  . Hyperlipidemia  02/13/2014  . Kidney stones 10/27/2013  . CA skin, basal cell 06/30/2013  . Lumbar canal stenosis 06/30/2013  . Arthritis, degenerative 06/30/2013  . Neuralgia neuritis, sciatic nerve 06/30/2013  . Coronary artery disease 10/25/2012  . Dyslipidemia 10/25/2012  .  NSVT (nonsustained ventricular tachycardia) ([place]) 10/25/2012  . Sick sinus syndrome ([place]) 10/25/2012  . Ischemic cardiomyopathy 10/22/2012  . Old anterior wall myocardial infarction 10/20/2012  . Hypertensive heart disease without CHF 10/20/2012   Susan [name] PT, DPT, GCS  Susan [name] 08/12/2019, 8:47 PM  [place] MAIN [facility] SERVICES [address] [place], [place], [address] Phone: [phone]   Fax:  [phone]  Name: Susan [name] MRN: [id number] Date of Birth: [birth year]

## 2019-08-14 ENCOUNTER — Ambulatory Visit: Payer: Medicare PPO

## 2019-08-14 ENCOUNTER — Other Ambulatory Visit: Payer: Self-pay

## 2019-08-14 DIAGNOSIS — R2681 Unsteadiness on feet: Secondary | ICD-10-CM | POA: Diagnosis not present

## 2019-08-14 DIAGNOSIS — M6281 Muscle weakness (generalized): Secondary | ICD-10-CM

## 2019-08-14 NOTE — Therapy (Signed)
[place] MAIN [facility] SERVICES [address] [place], [place], [address] Phone: [phone]   Fax:  [phone]  Physical Therapy Treatment  Patient Details  Name: Susan [name] MRN: [id number] Date of Birth: 07-27-1934 Referring Provider (PT): Dr. Melrose Nakayama   Encounter Date: 08/14/2019  PT End of Session - 08/14/19 1606    Visit Number  15    Number of Visits  25    Date for PT Re-Evaluation  09/16/19    Authorization Type  eval 06/24/19, PN: 07/29/19    PT Start Time  1600    PT Stop Time  1644    PT Time Calculation (min)  44 min    Equipment Utilized During Treatment  Gait belt    Activity Tolerance  Patient tolerated treatment well    Behavior During Therapy  [facility] for tasks assessed/performed       Past Medical History:  Diagnosis Date  . Bradycardia   . Chronic back pain   . Coronary artery disease    a. s/p STEMI in 2014 with DES to LAD with staged PCI/DES placement to LCx  . Echoencephalogram abnormality   . Heart murmur   . History of Holter monitoring   . Hyperlipidemia   . Hypertension   . Myocardial infarction ([place])   . Neuromuscular disorder (HCC)    neuropathy  . NSVT (nonsustained ventricular tachycardia) ([place])   . Personal history of chemotherapy    ENDOMETRIAL CA  . Personal history of radiation therapy    ENDOMETRIAL CA  . Uterine cancer ([place])    serrous    Past Surgical History:  Procedure Laterality Date  . ABDOMINAL HYSTERECTOMY    . BREAST EXCISIONAL BIOPSY Right 1956   NEG  . CARDIOVERSION N/A 07/27/2017   Procedure: CARDIOVERSION;  Surgeon: Skeet Latch, MD;  Location: [place];  Service: Cardiovascular;  Laterality: N/A;  . LEFT HEART CATHETERIZATION WITH CORONARY ANGIOGRAM  10/19/2012   Procedure: LEFT HEART CATHETERIZATION WITH CORONARY ANGIOGRAM;  Surgeon: Troy Sine, MD;  Location: [facility] CATH LAB;  Service: Cardiovascular;;  . PERCUTANEOUS CORONARY STENT INTERVENTION (PCI-S)  10/19/2012   Procedure: PERCUTANEOUS CORONARY STENT INTERVENTION (PCI-S);  Surgeon: Troy Sine, MD;  Location: [facility] CATH LAB;  Service: Cardiovascular;;  . PERCUTANEOUS CORONARY STENT INTERVENTION (PCI-S) N/A 10/23/2012   Procedure: PERCUTANEOUS CORONARY STENT INTERVENTION (PCI-S);  Surgeon: Troy Sine, MD;  Location: [facility] CATH LAB;  Service: Cardiovascular;  Laterality: N/A;  . PERIPHERAL VASCULAR CATHETERIZATION N/A 05/05/2015   Procedure: Glori Luis Cath Insertion;  Surgeon: Algernon Huxley, MD;  Location: [place] CV LAB;  Service: Cardiovascular;  Laterality: N/A;  . PORTA CATH REMOVAL N/A 04/05/2017   Procedure: PORTA CATH REMOVAL;  Surgeon: Algernon Huxley, MD;  Location: [place] CV LAB;  Service: Cardiovascular;  Laterality: N/A;    There were no vitals filed for this visit.  Subjective Assessment - 08/14/19 1604    Subjective  Patient reports her back pain is bothering her today, she has her AFO on today. Leaves next week for [place].    Pertinent History  Pt reports imbalance related to chemo-induced neuropathy which started 4 years ago. Neuropathy occurs in stocking distribution from knees to feet. Symptoms have been unchanged since that time. She has had one fall in the last 6 months. Denies dizziness or vertigo. No heachaches/migraines. She has a history of chronic back pain and has had a bilateral nerve ablation. Her husband passed away 4 weeks ago after  contracting COVID.    How long can you stand comfortably?  5 minutes    How long can you walk comfortably?  5 or 10 minutes    Patient Stated Goals  Strengthen her back muscles to decrease pain and improve her balance    Currently in Pain?  Yes    Pain Score  5     Pain Location  Back    Pain Orientation  Lower    Pain Descriptors / Indicators  Throbbing;Stabbing    Pain Type  Chronic pain    Pain Onset  More than a month ago    Pain Frequency  Constant             TREATMENT   Ther-ex Nustep Lvl 3-4 for cardiovascular  challenge x 3 minutes RTB around ankles: lateral stepping with focus on neutral foot alignment 4x length of // bars RTB around ankles: "monster/[place] walks" 4x length of // bars sit to stand from chair with decreasing UE support; intermittently performed between interventions Ambulate 40 ft with cane x 2 trials with cues for upright posture with improved foot clearance of LLE noted.  Neuromuscular Re-education airex pad: one foot on airex pad one foot on dynadisc for modified tandem stance 2x 30 second holds each position airex pad 6" step sandwhich; lateral step up/down w/o UE support 10x each direction. Challenging for single limb stability bosu ball: round side up: forward modified lunges without UE support 10x each LE Lateral bosu ball lunge round side up 10x each LE     Pt educated throughout session about proper posture and technique with exercises. Improved exercise technique, movement at target joints, use of target muscles after min to mod verbal, visual, tactile cues.    Patient continues to progress with functional stability on unstable surfaces. Ankle righting reactions are limited with reduction noted with fatigue. Intermittent rest breaks required for fatigue and to minimize back pain. Pt will benefit from PT services to address deficits in strength, balance, and mobility in order to return to full function at home                PT Education - 08/14/19 1605    Education Details  exercise technique, body mechanics    Person(s) Educated  Patient    Methods  Explanation;Demonstration;Tactile cues;Verbal cues    Comprehension  Verbalized understanding;Returned demonstration;Verbal cues required;Tactile cues required       PT Short Term Goals - 07/29/19 1452      PT SHORT TERM GOAL #1   Title  Pt will be independent with home exercise program in order to improve balance and leg strength to decrease fall risk and improve function at home    Baseline   07/29/19: Pt reports that she performs "every other day"    Time  6    Period  Weeks    Status  On-going    Target Date  08/06/19        PT Long Term Goals - 07/29/19 1453      PT LONG TERM GOAL #1   Title  Pt will improve BERG to at least 50/56 in order to demonstrate clinically significant improvement in balance.    Baseline  06/24/19: 43/56; 07/29/19: 47/56 (achieved initial goal of 3 point change)    Time  12    Period  Weeks    Status  Revised    Target Date  09/16/19      PT LONG TERM GOAL #2  Title  Pt will improve ABC by at least 13% in order to demonstrate clinically significant improvement in balance confidence.    Baseline  06/24/19: 35%; 07/29/19: 55.625%    Time  12    Period  Weeks    Status  Achieved      PT LONG TERM GOAL #3   Title  Pt will increase self-selected 10MWT by at least 0.13 m/s in order to demonstrate clinically significant improvement in community ambulation.    Baseline  06/24/19: 14.3s = 0.70 m/s; 07/29/19: 11.5s = 0.87 m/s;    Time  12    Period  Weeks    Status  Achieved      PT LONG TERM GOAL #4   Title  Pt will improve ABC to greater than 67% in order to demonstrate clinically significant improvement in balance confidence.    Baseline  07/29/19: 55.625%    Time  12    Period  Weeks    Status  New    Target Date  09/16/19            Plan - 08/14/19 1740    Clinical Impression Statement  Patient continues to progress with functional stability on unstable surfaces. Ankle righting reactions are limited with reduction noted with fatigue. Intermittent rest breaks required for fatigue and to minimize back pain. Pt will benefit from PT services to address deficits in strength, balance, and mobility in order to return to full function at home    Personal Factors and Comorbidities  Age;Comorbidity 3+;Past/Current Experience;Time since onset of injury/illness/exacerbation    Comorbidities  Neuropathy, chronic back pain, MI, OA, CAD, HTN     Examination-Activity Limitations  Bend;Locomotion Level;Lift;Stairs;Stand    Examination-Participation Restrictions  Community Activity;Laundry;Yard Work    Stability/Clinical Decision Making  Unstable/Unpredictable    Rehab Potential  Fair    PT Frequency  2x / week    PT Duration  12 weeks    PT Treatment/Interventions  ADLs/Self Care Home Management;Aquatic Therapy;Biofeedback;Canalith Repostioning;Cryotherapy;Electrical Stimulation;Iontophoresis 4mg /ml Dexamethasone;Moist Heat;Traction;Ultrasound;DME Instruction;Gait training;Stair training;Functional mobility training;Therapeutic activities;Therapeutic exercise;Balance training;Neuromuscular re-education;Cognitive remediation;Patient/family education;Manual techniques;Passive range of motion;Dry needling;Vestibular;Spinal Manipulations;Joint Manipulations    PT Next Visit Plan  Continue with strength and balance exercises during session.    PT Home Exercise Plan  Medbridge Access Code: [email or web address]    Consulted and Agree with Plan of Care  Patient       Patient will benefit from skilled therapeutic intervention in order to improve the following deficits and impairments:  Abnormal gait, Decreased activity tolerance, Decreased balance, Decreased mobility, Decreased strength, Difficulty walking, Impaired sensation, Pain  Visit Diagnosis: Unsteadiness on feet  Muscle weakness (generalized)     Problem List Patient Active Problem List   Diagnosis Date Noted  . Chemotherapy-induced neuropathy ([place]) 04/17/2018  . Neuropathy 04/17/2018  . Chemotherapy-induced peripheral neuropathy ([place]) 03/01/2018  . Persistent atrial fibrillation ([place])   . Port-A-Cath in place 03/28/2017  . Bradycardia 01/18/2017  . Obesity (BMI 30-39.9) 01/17/2017  . Pulmonary nodules 12/05/2016  . Compression fracture of L5 vertebra with delayed healing 11/01/2016  . Sacral insufficiency fracture with delayed healing 11/01/2016  . Sacral insufficiency fracture  09/05/2016  . Absence of bladder continence 07/21/2015  . Neuropathy associated with cancer ([place]) 06/02/2015  . Carcinoma of endometrium ([place]) 04/02/2015  . Gastro-esophageal reflux disease without esophagitis 02/15/2015  . Osteopenia 08/17/2014  . Hyperlipidemia 02/13/2014  . Kidney stones 10/27/2013  . CA skin, basal cell 06/30/2013  . Lumbar canal stenosis 06/30/2013  .  Arthritis, degenerative 06/30/2013  . Neuralgia neuritis, sciatic nerve 06/30/2013  . Coronary artery disease 10/25/2012  . Dyslipidemia 10/25/2012  . NSVT (nonsustained ventricular tachycardia) ([place]) 10/25/2012  . Sick sinus syndrome ([name]) 10/25/2012  . Ischemic cardiomyopathy 10/22/2012  . Old anterior wall myocardial infarction 10/20/2012  . Hypertensive heart disease without CHF 10/20/2012   Janna Arch, PT, DPT   08/14/2019, 5:40 PM  [place] MAIN [facility] SERVICES [address] [place], [place], [address] Phone: [phone]   Fax:  [phone]  Name: Susan [name] MRN: [id number] Date of Birth: 1935-02-07

## 2019-08-15 ENCOUNTER — Other Ambulatory Visit: Payer: Self-pay | Admitting: Student in an Organized Health Care Education/Training Program

## 2019-08-17 ENCOUNTER — Other Ambulatory Visit: Payer: Self-pay | Admitting: Cardiovascular Disease

## 2019-08-17 DIAGNOSIS — I2510 Atherosclerotic heart disease of native coronary artery without angina pectoris: Secondary | ICD-10-CM

## 2019-08-18 NOTE — Telephone Encounter (Signed)
Rx request sent to pharmacy.  

## 2019-08-19 ENCOUNTER — Other Ambulatory Visit: Payer: Self-pay

## 2019-08-19 ENCOUNTER — Ambulatory Visit: Payer: Medicare PPO

## 2019-08-19 DIAGNOSIS — R2681 Unsteadiness on feet: Secondary | ICD-10-CM

## 2019-08-19 DIAGNOSIS — M6281 Muscle weakness (generalized): Secondary | ICD-10-CM

## 2019-08-19 NOTE — Therapy (Signed)
[place] MAIN [facility] SERVICES [address] [place], [place], [address] Phone: [phone]   Fax:  [phone]  Physical Therapy Treatment  Patient Details  Name: Susan [name] MRN: [id number] Date of Birth: 1935/03/25 Referring Provider (PT): Dr. Melrose Nakayama   Encounter Date: 08/19/2019  PT End of Session - 08/19/19 1437    Visit Number  16    Number of Visits  25    Date for PT Re-Evaluation  09/16/19    Authorization Type  eval 06/24/19, PN: 07/29/19    PT Start Time  1432    PT Stop Time  1515    PT Time Calculation (min)  43 min    Equipment Utilized During Treatment  Gait belt    Activity Tolerance  Patient tolerated treatment well    Behavior During Therapy  [facility] for tasks assessed/performed       Past Medical History:  Diagnosis Date  . Bradycardia   . Chronic back pain   . Coronary artery disease    a. s/p STEMI in 2014 with DES to LAD with staged PCI/DES placement to LCx  . Echoencephalogram abnormality   . Heart murmur   . History of Holter monitoring   . Hyperlipidemia   . Hypertension   . Myocardial infarction ([place])   . Neuromuscular disorder (HCC)    neuropathy  . NSVT (nonsustained ventricular tachycardia) ([place])   . Personal history of chemotherapy    ENDOMETRIAL CA  . Personal history of radiation therapy    ENDOMETRIAL CA  . Uterine cancer ([place])    serrous    Past Surgical History:  Procedure Laterality Date  . ABDOMINAL HYSTERECTOMY    . BREAST EXCISIONAL BIOPSY Right 1956   NEG  . CARDIOVERSION N/A 07/27/2017   Procedure: CARDIOVERSION;  Surgeon: Skeet Latch, MD;  Location: [place];  Service: Cardiovascular;  Laterality: N/A;  . LEFT HEART CATHETERIZATION WITH CORONARY ANGIOGRAM  10/19/2012   Procedure: LEFT HEART CATHETERIZATION WITH CORONARY ANGIOGRAM;  Surgeon: Troy Sine, MD;  Location: [facility] CATH LAB;  Service: Cardiovascular;;  . PERCUTANEOUS CORONARY STENT INTERVENTION (PCI-S)  10/19/2012    Procedure: PERCUTANEOUS CORONARY STENT INTERVENTION (PCI-S);  Surgeon: Troy Sine, MD;  Location: [facility] CATH LAB;  Service: Cardiovascular;;  . PERCUTANEOUS CORONARY STENT INTERVENTION (PCI-S) N/A 10/23/2012   Procedure: PERCUTANEOUS CORONARY STENT INTERVENTION (PCI-S);  Surgeon: Troy Sine, MD;  Location: [facility] CATH LAB;  Service: Cardiovascular;  Laterality: N/A;  . PERIPHERAL VASCULAR CATHETERIZATION N/A 05/05/2015   Procedure: Glori Luis Cath Insertion;  Surgeon: Algernon Huxley, MD;  Location: [place] CV LAB;  Service: Cardiovascular;  Laterality: N/A;  . PORTA CATH REMOVAL N/A 04/05/2017   Procedure: PORTA CATH REMOVAL;  Surgeon: Algernon Huxley, MD;  Location: [place] CV LAB;  Service: Cardiovascular;  Laterality: N/A;    There were no vitals filed for this visit.  Subjective Assessment - 08/19/19 1435    Subjective  Patient reports her back pain is bothering her today, she has her AFO on today. She leaves for [place] tomorrow morning and will be gone for two weeks.    Pertinent History  Pt reports imbalance related to chemo-induced neuropathy which started 4 years ago. Neuropathy occurs in stocking distribution from knees to feet. Symptoms have been unchanged since that time. She has had one fall in the last 6 months. Denies dizziness or vertigo. No heachaches/migraines. She has a history of chronic back pain and has had a bilateral nerve  ablation. Her husband passed away 4 weeks ago after contracting COVID.    How long can you stand comfortably?  5 minutes    How long can you walk comfortably?  5 or 10 minutes    Patient Stated Goals  Strengthen her back muscles to decrease pain and improve her balance    Currently in Pain?  Yes    Pain Score  5     Pain Location  Back    Pain Orientation  Lower    Pain Descriptors / Indicators  Throbbing;Stabbing    Pain Type  Chronic pain    Pain Onset  More than a month ago    Pain Frequency  Constant        TREATMENT   Ther-ex Octane  xRide L3 x 5 minutes for warm-up during history (2 minute unbilled);  Quantum BLE leg press 115# 2 x 20; Quantum BLE heel raises 70# 2 x 20; Matrix resisted walking 17.5# forward/backward/R lateral/L lateral x 2 each direction; 6" alternating step-ups without UE support x 10 BLE;   Neuromuscular Re-education  6" alternating step taps without UE support x 10 BLE; Airex 6" alternating step taps without UE support x 10 BLE; Airex staggerred stance with front foot on 6" step x 30s BLE;   Pt educated throughout session about proper posture and technique with exercises. Improved exercise technique, movement at target joints, use of target muscles after min to mod verbal, visual, tactile cues.   Pt demonstrates excellent motivation during session today. She is able to increase her resistance on the leg press today and performed resisted walking. Included some limited balance exercises as well. Pt encouraged tofollow-up as scheduled.She leaves for [place] tomorrow and will be gone for a couple weeks. Pt will benefit from PT services to address deficits in strength, balance, and mobility in order to return to full function at home.                                  PT Short Term Goals - 07/29/19 1452      PT SHORT TERM GOAL #1   Title  Pt will be independent with home exercise program in order to improve balance and leg strength to decrease fall risk and improve function at home    Baseline  07/29/19: Pt reports that she performs "every other day"    Time  6    Period  Weeks    Status  On-going    Target Date  08/06/19        PT Long Term Goals - 07/29/19 1453      PT LONG TERM GOAL #1   Title  Pt will improve BERG to at least 50/56 in order to demonstrate clinically significant improvement in balance.    Baseline  06/24/19: 43/56; 07/29/19: 47/56 (achieved initial goal of 3 point change)    Time  12    Period  Weeks    Status  Revised    Target Date   09/16/19      PT LONG TERM GOAL #2   Title  Pt will improve ABC by at least 13% in order to demonstrate clinically significant improvement in balance confidence.    Baseline  06/24/19: 35%; 07/29/19: 55.625%    Time  12    Period  Weeks    Status  Achieved      PT LONG TERM GOAL #3  Title  Pt will increase self-selected 10MWT by at least 0.13 m/s in order to demonstrate clinically significant improvement in community ambulation.    Baseline  06/24/19: 14.3s = 0.70 m/s; 07/29/19: 11.5s = 0.87 m/s;    Time  12    Period  Weeks    Status  Achieved      PT LONG TERM GOAL #4   Title  Pt will improve ABC to greater than 67% in order to demonstrate clinically significant improvement in balance confidence.    Baseline  07/29/19: 55.625%    Time  12    Period  Weeks    Status  New    Target Date  09/16/19            Plan - 08/19/19 1437    Clinical Impression Statement  Pt demonstrates excellent motivation during session today. She is able to increase her resistance on the leg press today and performed resisted walking. Included some limited balance exercises as well. Pt encouraged to follow-up as scheduled. She leaves for [place] tomorrow and will be gone for a couple weeks. Pt will benefit from PT services to address deficits in strength, balance, and mobility in order to return to full function at home.    Personal Factors and Comorbidities  Age;Comorbidity 3+;Past/Current Experience;Time since onset of injury/illness/exacerbation    Comorbidities  Neuropathy, chronic back pain, MI, OA, CAD, HTN    Examination-Activity Limitations  Bend;Locomotion Level;Lift;Stairs;Stand    Examination-Participation Restrictions  Community Activity;Laundry;Yard Work    Stability/Clinical Decision Making  Unstable/Unpredictable    Rehab Potential  Fair    PT Frequency  2x / week    PT Duration  12 weeks    PT Treatment/Interventions  ADLs/Self Care Home Management;Aquatic Therapy;Biofeedback;Canalith  Repostioning;Cryotherapy;Electrical Stimulation;Iontophoresis 4mg /ml Dexamethasone;Moist Heat;Traction;Ultrasound;DME Instruction;Gait training;Stair training;Functional mobility training;Therapeutic activities;Therapeutic exercise;Balance training;Neuromuscular re-education;Cognitive remediation;Patient/family education;Manual techniques;Passive range of motion;Dry needling;Vestibular;Spinal Manipulations;Joint Manipulations    PT Next Visit Plan  Continue with strength and balance exercises during session.    PT Home Exercise Plan  Medbridge Access Code: [email or web address]    Consulted and Agree with Plan of Care  Patient       Patient will benefit from skilled therapeutic intervention in order to improve the following deficits and impairments:  Abnormal gait, Decreased activity tolerance, Decreased balance, Decreased mobility, Decreased strength, Difficulty walking, Impaired sensation, Pain  Visit Diagnosis: Unsteadiness on feet  Muscle weakness (generalized)     Problem List Patient Active Problem List   Diagnosis Date Noted  . Chemotherapy-induced neuropathy ([place]) 04/17/2018  . Neuropathy 04/17/2018  . Chemotherapy-induced peripheral neuropathy ([place]) 03/01/2018  . Persistent atrial fibrillation ([place])   . Port-A-Cath in place 03/28/2017  . Bradycardia 01/18/2017  . Obesity (BMI 30-39.9) 01/17/2017  . Pulmonary nodules 12/05/2016  . Compression fracture of L5 vertebra with delayed healing 11/01/2016  . Sacral insufficiency fracture with delayed healing 11/01/2016  . Sacral insufficiency fracture 09/05/2016  . Absence of bladder continence 07/21/2015  . Neuropathy associated with cancer ([place]) 06/02/2015  . Carcinoma of endometrium ([place]) 04/02/2015  . Gastro-esophageal reflux disease without esophagitis 02/15/2015  . Osteopenia 08/17/2014  . Hyperlipidemia 02/13/2014  . Kidney stones 10/27/2013  . CA skin, basal cell 06/30/2013  . Lumbar canal stenosis 06/30/2013  . Arthritis,  degenerative 06/30/2013  . Neuralgia neuritis, sciatic nerve 06/30/2013  . Coronary artery disease 10/25/2012  . Dyslipidemia 10/25/2012  . NSVT (nonsustained ventricular tachycardia) ([place]) 10/25/2012  . Sick sinus syndrome ([place]) 10/25/2012  . Ischemic cardiomyopathy 10/22/2012  .  Old anterior wall myocardial infarction 10/20/2012  . Hypertensive heart disease without CHF 10/20/2012   Susan [name] PT, DPT, GCS  Susan [name] 08/20/2019, 1:08 PM  [place] MAIN [facility] SERVICES [address] [place], [place], [address] Phone: [phone]   Fax:  [phone]  Name: Susan [name] MRN: [id number] Date of Birth: 05/06/1934

## 2019-08-21 ENCOUNTER — Ambulatory Visit: Payer: Medicare PPO

## 2019-08-26 ENCOUNTER — Ambulatory Visit: Payer: Medicare PPO

## 2019-08-27 ENCOUNTER — Other Ambulatory Visit: Payer: Medicare Other

## 2019-08-27 ENCOUNTER — Ambulatory Visit: Payer: Medicare Other

## 2019-08-28 ENCOUNTER — Ambulatory Visit: Payer: Medicare PPO

## 2019-09-02 ENCOUNTER — Ambulatory Visit: Payer: Medicare PPO

## 2019-09-04 ENCOUNTER — Other Ambulatory Visit: Payer: Self-pay

## 2019-09-04 ENCOUNTER — Ambulatory Visit: Payer: Medicare PPO | Attending: Neurology

## 2019-09-04 ENCOUNTER — Encounter: Payer: Self-pay | Admitting: Student in an Organized Health Care Education/Training Program

## 2019-09-04 ENCOUNTER — Ambulatory Visit
Payer: Medicare PPO | Attending: Student in an Organized Health Care Education/Training Program | Admitting: Student in an Organized Health Care Education/Training Program

## 2019-09-04 VITALS — BP 107/81 | HR 75 | Temp 97.3°F | Resp 16 | Ht 64.0 in | Wt 204.0 lb

## 2019-09-04 DIAGNOSIS — G894 Chronic pain syndrome: Secondary | ICD-10-CM | POA: Diagnosis not present

## 2019-09-04 DIAGNOSIS — M48062 Spinal stenosis, lumbar region with neurogenic claudication: Secondary | ICD-10-CM | POA: Insufficient documentation

## 2019-09-04 DIAGNOSIS — M6281 Muscle weakness (generalized): Secondary | ICD-10-CM | POA: Insufficient documentation

## 2019-09-04 DIAGNOSIS — R2681 Unsteadiness on feet: Secondary | ICD-10-CM | POA: Diagnosis not present

## 2019-09-04 DIAGNOSIS — M47816 Spondylosis without myelopathy or radiculopathy, lumbar region: Secondary | ICD-10-CM | POA: Diagnosis present

## 2019-09-04 NOTE — Progress Notes (Signed)
Safety precautions to be maintained throughout the outpatient stay will include: orient to surroundings, keep bed in low position, maintain call bell within reach at all times, provide assistance with transfer out of bed and ambulation.  

## 2019-09-04 NOTE — Therapy (Signed)
[place] MAIN [facility] SERVICES [address] [place], [place], [address] Phone: [phone]   Fax:  [phone]  Physical Therapy Treatment  Patient Details  Name: Susan [name] MRN: [id number] Date of Birth: [birth year] Referring Provider (PT): Dr. Melrose Nakayama   Encounter Date: 09/04/2019   PT End of Session - 09/05/19 0830    Visit Number 17    Number of Visits 25    Date for PT Re-Evaluation 09/16/19    Authorization Type eval 06/24/19, PN: 07/29/19    PT Start Time 1600    PT Stop Time 1645    PT Time Calculation (min) 45 min    Equipment Utilized During Treatment Gait belt    Activity Tolerance Patient tolerated treatment well    Behavior During Therapy [facility] for tasks assessed/performed           Past Medical History:  Diagnosis Date  . Bradycardia   . Chronic back pain   . Coronary artery disease    a. s/p STEMI in 2014 with DES to LAD with staged PCI/DES placement to LCx  . Echoencephalogram abnormality   . Heart murmur   . History of Holter monitoring   . Hyperlipidemia   . Hypertension   . Myocardial infarction ([place])   . Neuromuscular disorder (HCC)    neuropathy  . NSVT (nonsustained ventricular tachycardia) ([place])   . Personal history of chemotherapy    ENDOMETRIAL CA  . Personal history of radiation therapy    ENDOMETRIAL CA  . Uterine cancer ([place])    serrous    Past Surgical History:  Procedure Laterality Date  . ABDOMINAL HYSTERECTOMY    . BREAST EXCISIONAL BIOPSY Right 1956   NEG  . CARDIOVERSION N/A 07/27/2017   Procedure: CARDIOVERSION;  Surgeon: Skeet Latch, MD;  Location: [place];  Service: Cardiovascular;  Laterality: N/A;  . LEFT HEART CATHETERIZATION WITH CORONARY ANGIOGRAM  10/19/2012   Procedure: LEFT HEART CATHETERIZATION WITH CORONARY ANGIOGRAM;  Surgeon: Troy Sine, MD;  Location: [facility] CATH LAB;  Service: Cardiovascular;;  . PERCUTANEOUS CORONARY STENT INTERVENTION (PCI-S)  10/19/2012    Procedure: PERCUTANEOUS CORONARY STENT INTERVENTION (PCI-S);  Surgeon: Troy Sine, MD;  Location: [facility] CATH LAB;  Service: Cardiovascular;;  . PERCUTANEOUS CORONARY STENT INTERVENTION (PCI-S) N/A 10/23/2012   Procedure: PERCUTANEOUS CORONARY STENT INTERVENTION (PCI-S);  Surgeon: Troy Sine, MD;  Location: [facility] CATH LAB;  Service: Cardiovascular;  Laterality: N/A;  . PERIPHERAL VASCULAR CATHETERIZATION N/A 05/05/2015   Procedure: Glori Luis Cath Insertion;  Surgeon: Algernon Huxley, MD;  Location: [place] CV LAB;  Service: Cardiovascular;  Laterality: N/A;  . PORTA CATH REMOVAL N/A 04/05/2017   Procedure: PORTA CATH REMOVAL;  Surgeon: Algernon Huxley, MD;  Location: [place] CV LAB;  Service: Cardiovascular;  Laterality: N/A;    There were no vitals filed for this visit.   Subjective Assessment - 09/04/19 1618    Subjective Patient reports she is fatigued today from a long day of travelling yesterday when returning from [place]. She saw the pain clinic this morning and they ordered an MRI of her lumbar spine. Per medical records the last MRI was 2019. No specific questions or concerns.    Pertinent History Pt reports imbalance related to chemo-induced neuropathy which started 4 years ago. Neuropathy occurs in stocking distribution from knees to feet. Symptoms have been unchanged since that time. She has had one fall in the last 6 months. Denies dizziness or vertigo. No heachaches/migraines. She has  a history of chronic back pain and has had a bilateral nerve ablation. Her husband passed away 4 weeks ago after contracting COVID.    How long can you stand comfortably? 5 minutes    How long can you walk comfortably? 5 or 10 minutes    Patient Stated Goals Strengthen her back muscles to decrease pain and improve her balance    Currently in Pain? Yes    Pain Score 5     Pain Location Back    Pain Orientation Lower    Pain Descriptors / Indicators Cramping    Pain Type Chronic pain    Pain Onset More  than a month ago    Pain Frequency Constant             TREATMENT   Ther-ex NuStep L5 x 5 minutes during history  Extensive education with patient regarding spinal and foraminal stenosis using 3D models and her MRI images; Seated marches 3# ankle weights (AW) 2 x 20 BLE; Seated LAQ with 3# AW 2 x 20 BLE; Seated clams with green tband x 20 BLE; Seated adductor ball squeezes 3s hold x 20 BLE; Standing marches with 3# AW x 20 BLE; Standing heel raises x 20;   Pt educated throughout session about proper posture and technique with exercises. Improved exercise technique, movement at target joints, use of target muscles after min to mod verbal, visual, tactile cues.   Pt demonstrates excellent motivation during session today however she is fatigued upon arrival so session proceeded slowly. She saw the pain clinic this morning and had some questions regarding spinal stenosis. Extensive education with patient regarding spinal and foraminal stenosis using 3D models and her MRI images. Pt requested not to perform leg press today due to fatigue so avoided heavy LE pressing exercises such as machine press, squats, or sit to stand. Pt encouraged to continue her HEP and tofollow-up as scheduled. Pt will benefit from PT services to address deficits in strength, balance, and mobility in order to return to full function at home.                           PT Short Term Goals - 07/29/19 1452      PT SHORT TERM GOAL #1   Title Pt will be independent with home exercise program in order to improve balance and leg strength to decrease fall risk and improve function at home    Baseline 07/29/19: Pt reports that she performs "every other day"    Time 6    Period Weeks    Status On-going    Target Date 08/06/19             PT Long Term Goals - 07/29/19 1453      PT LONG TERM GOAL #1   Title Pt will improve BERG to at least 50/56 in order to demonstrate clinically  significant improvement in balance.    Baseline 06/24/19: 43/56; 07/29/19: 47/56 (achieved initial goal of 3 point change)    Time 12    Period Weeks    Status Revised    Target Date 09/16/19      PT LONG TERM GOAL #2   Title Pt will improve ABC by at least 13% in order to demonstrate clinically significant improvement in balance confidence.    Baseline 06/24/19: 35%; 07/29/19: 55.625%    Time 12    Period Weeks    Status Achieved  PT LONG TERM GOAL #3   Title Pt will increase self-selected 10MWT by at least 0.13 m/s in order to demonstrate clinically significant improvement in community ambulation.    Baseline 06/24/19: 14.3s = 0.70 m/s; 07/29/19: 11.5s = 0.87 m/s;    Time 12    Period Weeks    Status Achieved      PT LONG TERM GOAL #4   Title Pt will improve ABC to greater than 67% in order to demonstrate clinically significant improvement in balance confidence.    Baseline 07/29/19: 55.625%    Time 12    Period Weeks    Status New    Target Date 09/16/19                 Plan - 09/05/19 0831    Clinical Impression Statement Pt demonstrates excellent motivation during session today however she is fatigued upon arrival so session proceeded slowly. She saw the pain clinic this morning and had some questions regarding spinal stenosis. Extensive education with patient regarding spinal and foraminal stenosis using 3D models and her MRI images. Pt requested not to perform leg press today due to fatigue so avoided heavy LE pressing exercises such as machine press, squats, or sit to stand. Pt encouraged to continue her HEP and to follow-up as scheduled. Pt will benefit from PT services to address deficits in strength, balance, and mobility in order to return to full function at home.    Personal Factors and Comorbidities Age;Comorbidity 3+;Past/Current Experience;Time since onset of injury/illness/exacerbation    Comorbidities Neuropathy, chronic back pain, MI, OA, CAD, HTN     Examination-Activity Limitations Bend;Locomotion Level;Lift;Stairs;Stand    Examination-Participation Restrictions Community Activity;Laundry;Yard Work    Stability/Clinical Decision Making Unstable/Unpredictable    Rehab Potential Fair    PT Frequency 2x / week    PT Duration 12 weeks    PT Treatment/Interventions ADLs/Self Care Home Management;Aquatic Therapy;Biofeedback;Canalith Repostioning;Cryotherapy;Electrical Stimulation;Iontophoresis 4mg /ml Dexamethasone;Moist Heat;Traction;Ultrasound;DME Instruction;Gait training;Stair training;Functional mobility training;Therapeutic activities;Therapeutic exercise;Balance training;Neuromuscular re-education;Cognitive remediation;Patient/family education;Manual techniques;Passive range of motion;Dry needling;Vestibular;Spinal Manipulations;Joint Manipulations    PT Next Visit Plan Continue with strength and balance exercises during session.    PT Home Exercise Plan Medbridge Access Code: [email or web address]    Consulted and Agree with Plan of Care Patient           Patient will benefit from skilled therapeutic intervention in order to improve the following deficits and impairments:  Abnormal gait, Decreased activity tolerance, Decreased balance, Decreased mobility, Decreased strength, Difficulty walking, Impaired sensation, Pain  Visit Diagnosis: Unsteadiness on feet  Muscle weakness (generalized)     Problem List Patient Active Problem List   Diagnosis Date Noted  . Spondylosis without myelopathy or radiculopathy, lumbar region 09/04/2019  . Chronic pain syndrome 09/04/2019  . Chemotherapy-induced neuropathy ([place]) 04/17/2018  . Neuropathy 04/17/2018  . Chemotherapy-induced peripheral neuropathy ([place]) 03/01/2018  . Persistent atrial fibrillation ([place])   . Port-A-Cath in place 03/28/2017  . Bradycardia 01/18/2017  . Obesity (BMI 30-39.9) 01/17/2017  . Pulmonary nodules 12/05/2016  . Compression fracture of L5 vertebra with delayed healing  11/01/2016  . Sacral insufficiency fracture with delayed healing 11/01/2016  . Sacral insufficiency fracture 09/05/2016  . Absence of bladder continence 07/21/2015  . Neuropathy associated with cancer ([place]) 06/02/2015  . Carcinoma of endometrium ([place]) 04/02/2015  . Gastro-esophageal reflux disease without esophagitis 02/15/2015  . Osteopenia 08/17/2014  . Hyperlipidemia 02/13/2014  . Kidney stones 10/27/2013  . CA skin, basal cell 06/30/2013  . Spinal stenosis, lumbar region,  with neurogenic claudication 06/30/2013  . Arthritis, degenerative 06/30/2013  . Neuralgia neuritis, sciatic nerve 06/30/2013  . Coronary artery disease 10/25/2012  . Dyslipidemia 10/25/2012  . NSVT (nonsustained ventricular tachycardia) ([place]) 10/25/2012  . Sick sinus syndrome ([place]) 10/25/2012  . Ischemic cardiomyopathy 10/22/2012  . Old anterior wall myocardial infarction 10/20/2012  . Hypertensive heart disease without CHF 10/20/2012   Lyndel Safe Nedda Gains PT, DPT, GCS  Shaleen Talamantez 09/05/2019, 8:35 AM  [place] MAIN [facility] SERVICES [address] [place], [place], [address] Phone: [phone]   Fax:  [phone]  Name: Susan [name] MRN: [id number] Date of Birth: 04-29-1934

## 2019-09-04 NOTE — Progress Notes (Signed)
PROVIDER NOTE: Information contained herein reflects review and annotations entered in association with encounter. Interpretation of such information and data should be left to medically-trained personnel. Information provided to patient can be located elsewhere in the medical record under "Patient Instructions". Document created using STT-dictation technology, any transcriptional errors that may result from process are unintentional.    Patient: Susan [name]  Service Category: E/M  Provider: Gillis Santa, MD  DOB: 1934/05/15  DOS: 09/04/2019  Specialty: Interventional Pain Management  MRN: [id number]  Setting: Ambulatory outpatient  PCP: [name], MD  Type: Established Patient    Referring Provider: [name], MD  Location: Office  Delivery: Face-to-face     HPI  Reason for encounter: [name], a 85 y.o. year old female, is here today for evaluation and management of her Lumbar facet arthropathy [M47.816]. Ms. Kizziah primary complain today is Back Pain (lower) Last encounter: Practice (08/15/2019). My last encounter with her was on 08/15/2019. Pertinent problems: Ms. Stoneberg has Ischemic cardiomyopathy; Coronary artery disease; Spinal stenosis, lumbar region, with neurogenic claudication; Carcinoma of endometrium ([place]); Chemotherapy-induced neuropathy ([place]); Compression fracture of L5 vertebra with delayed healing; Sacral insufficiency fracture with delayed healing; and Spondylosis without myelopathy or radiculopathy, lumbar region on their pertinent problem list. Pain Assessment: Severity of Chronic pain is reported as a 5 /10. Location: Back Lower/denies. Onset: More than a month ago. Quality: Other (Comment), Cramping (grabbing). Timing: Constant. Modifying factor(s): heat, elevation of heat. Vitals:  height is '5\' 4"'  (1.626 m) and weight is 204 lb (92.5 kg). Her temporal temperature is 97.3 F (36.3 C) (abnormal). Her blood pressure is 107/81 and her pulse is 75. Her  respiration is 16 and oxygen saturation is 98%.   Patient presents today with persistent and worsening low back pain that is worse with lumbar extension.  He gets better when she is sitting down.  Unfortunately her husband passed away in May 27, 2019 secondary to Covid.  She has been with her sister in [place].  She got back last night.  Patient is requesting a lumbar MRI for her low back and leg pain.  ROS  Constitutional: Denies any fever or chills Gastrointestinal: No reported hemesis, hematochezia, vomiting, or acute GI distress Musculoskeletal: Denies any acute onset joint swelling, redness, loss of ROM, or weakness Neurological: No reported episodes of acute onset apraxia, aphasia, dysarthria, agnosia, amnesia, paralysis, loss of coordination, or loss of consciousness  Medication Review  acetaminophen, apixaban, aspirin EC, atorvastatin, ciprofloxacin, ezetimibe, fluticasone, furosemide, isosorbide mononitrate, loperamide, losartan, mirabegron ER, multivitamin with minerals, nitroGLYCERIN, omeprazole, tiZANidine, and vitamin B-12  History Review  Allergy: Ms. Doepke is allergic to ace inhibitors and penicillins. Drug: Ms. Talamantez  reports no history of drug use. Alcohol:  reports current alcohol use of about 1.0 standard drink of alcohol per week. Tobacco:  reports that she quit smoking about 45 years ago. She has never used smokeless tobacco. Social: Ms. Donoso  reports that she quit smoking about 45 years ago. She has never used smokeless tobacco. She reports current alcohol use of about 1.0 standard drink of alcohol per week. She reports that she does not use drugs. Medical:  has a past medical history of Bradycardia, Chronic back pain, Coronary artery disease, Echoencephalogram abnormality, Heart murmur, History of Holter monitoring, Hyperlipidemia, Hypertension, Myocardial infarction [facility]), Neuromuscular disorder ([place]), NSVT (nonsustained ventricular tachycardia) ([place]), Personal  history of chemotherapy, Personal history of radiation therapy, and Uterine cancer ([place]). Surgical: Ms. Laible  has a past surgical history that  includes left heart catheterization with coronary angiogram (10/19/2012); percutaneous coronary stent intervention (pci-s) (10/19/2012); percutaneous coronary stent intervention (pci-s) (N/A, 10/23/2012); Abdominal hysterectomy; Cardiac catheterization (N/A, 05/05/2015); Breast excisional biopsy (Right, 1956); PORTA CATH REMOVAL (N/A, 04/05/2017); and Cardioversion (N/A, 07/27/2017). Family: family history includes Breast cancer (age of onset: 12) in her mother; Diabetes in her brother and father; Heart disease in her father and mother; Stroke in her brother.  Laboratory Chemistry Profile   Renal Lab Results  Component Value Date   BUN 18 02/26/2019   CREATININE 1.06 (H) 02/26/2019   GFRAA 57 (L) 02/26/2019   GFRNONAA 49 (L) 02/26/2019     Hepatic Lab Results  Component Value Date   AST 25 02/26/2019   ALT 14 02/26/2019   ALBUMIN 4.0 02/26/2019   ALKPHOS 92 02/26/2019     Electrolytes Lab Results  Component Value Date   NA 137 02/26/2019   K 3.7 02/26/2019   CL 103 02/26/2019   CALCIUM 9.5 02/26/2019   MG 2.2 07/14/2015     Bone No results found for: VD25OH, VD125OH2TOT, CB7628BT5, VV6160VP7, 25OHVITD1, 25OHVITD2, 25OHVITD3, TESTOFREE, TESTOSTERONE   Inflammation (CRP: Acute Phase) (ESR: Chronic Phase) No results found for: CRP, ESRSEDRATE, LATICACIDVEN     Note: Above Lab results reviewed.  Recent Imaging Review  MM 3D SCREEN BREAST BILATERAL CLINICAL DATA:  Screening.  EXAM: DIGITAL SCREENING BILATERAL MAMMOGRAM WITH TOMO AND CAD  COMPARISON:  Previous exam(s).  ACR Breast Density Category c: The breast tissue is heterogeneously dense, which may obscure small masses.  FINDINGS: There are no findings suspicious for malignancy. Images were processed with CAD.  IMPRESSION: No mammographic evidence of malignancy. A result  letter of this screening mammogram will be mailed directly to the patient.  RECOMMENDATION: Screening mammogram in one year. (Code:SM-B-01Y)  BI-RADS CATEGORY  1: Negative.  Electronically Signed   By: Curlene Dolphin M.D.   On: 06/25/2019 16:19 Note: Reviewed        Physical Exam  General appearance: Well nourished, well developed, and well hydrated. In no apparent acute distress Mental status: Alert, oriented x 3 (person, place, & time)       Respiratory: No evidence of acute respiratory distress Eyes: PERLA Vitals: BP 107/81   Pulse 75   Temp (!) 97.3 F (36.3 C) (Temporal)   Resp 16   Ht '5\' 4"'  (1.626 m)   Wt 204 lb (92.5 kg)   SpO2 98%   BMI 35.02 kg/m  BMI: Estimated body mass index is 35.02 kg/m as calculated from the following:   Height as of this encounter: '5\' 4"'  (1.626 m).   Weight as of this encounter: 204 lb (92.5 kg). Ideal: Ideal body weight: 54.7 kg (120 lb 9.5 oz) Adjusted ideal body weight: 69.8 kg (153 lb 15.3 oz)  Lumbar Spine Area Exam  Skin & Axial Inspection: No masses, redness, or swelling Alignment: Symmetrical Functional ROM: Decreased ROM       Stability: No instability detected Muscle Tone/Strength: Functionally intact. No obvious neuro-muscular anomalies detected. Sensory (Neurological): Neurogenic pain pattern Palpation: No palpable anomalies       Provocative Tests: Hyperextension/rotation test: (+) bilaterally for facet joint pain. Lumbar quadrant test (Kemp's test): (+) bilaterally for facet joint pain.   Lower Extremity Exam    Side: Right lower extremity  Side: Left lower extremity  Stability: No instability observed          Stability: No instability observed          Skin &  Extremity Inspection: Skin color, temperature, and hair growth are WNL. No peripheral edema or cyanosis. No masses, redness, swelling, asymmetry, or associated skin lesions. No contractures.  Skin & Extremity Inspection: Skin color, temperature, and hair growth  are WNL. No peripheral edema or cyanosis. No masses, redness, swelling, asymmetry, or associated skin lesions. No contractures.  Functional ROM: Pain restricted ROM for hip joint          Functional ROM: Pain restricted ROM for hip joint          Muscle Tone/Strength: Functionally intact. No obvious neuro-muscular anomalies detected.  Muscle Tone/Strength: Functionally intact. No obvious neuro-muscular anomalies detected.  Sensory (Neurological): Dermatomal pain pattern        Sensory (Neurological): Dermatomal pain pattern        DTR: Patellar: deferred today Achilles: deferred today Plantar: deferred today  DTR: Patellar: deferred today Achilles: deferred today Plantar: deferred today  Palpation: No palpable anomalies  Palpation: No palpable anomalies    Assessment   Status Diagnosis  Persistent Persistent Persistent 1. Lumbar facet arthropathy   2. Spondylosis without myelopathy or radiculopathy, lumbar region   3. Spinal stenosis, lumbar region, with neurogenic claudication   4. Chronic pain syndrome      Updated Problems: Problem  Spondylosis Without Myelopathy Or Radiculopathy, Lumbar Region  Chemotherapy-Induced Neuropathy (H[facility])  Compression Fracture of L5 Vertebra With Delayed Healing  Sacral Insufficiency Fracture With Delayed Healing  Carcinoma of Endometrium (H[facility])   Stage III, treated with radiation therapy as well as Taxol/cisplatin last 2017   Spinal Stenosis, Lumbar Region, With Neurogenic Claudication  Coronary artery disease   10/19/12-anterior MI 3.0x38 mm and 3.0x16 mm Promus Premier DES stents to LAD.   She also was found to have a 95% mid AV groove circumflex stenosis.  On 10/23/2012 she underwent successful staged intervention with insertion of a 2.25x12 mm Promus premier DES stent into the circumflex vessel.    Ischemic cardiomyopathy   EF 40-45% by echocardiogram October 2018   Chronic Pain Syndrome    Plan of Care  Continue with physical therapy.   Recommend lumbar MRI with contrast to evaluate for lumbar spinal and neuroforaminal stenosis given low back pain which is worse with lumbar extension and difficulty walking.  Will review treatment plan after lumbar MRI Orders:  Orders Placed This Encounter  Procedures  . MR LUMBAR SPINE WO CONTRAST    Patient presents with axial pain with possible radicular component.  In addition to any acute findings, please report on:  1. Facet (Zygapophyseal) joint DJD (Hypertrophy, space narrowing, subchondral sclerosis, and/or osteophyte formation) 2. DDD and/or IVDD (Loss of disc height, desiccation or "Black disc disease") 3. Pars defects 4. Spondylolisthesis, spondylosis, and/or spondyloarthropathies (include Degree/Grade of displacement in mm) 5. Vertebral body Fractures, including age (old, new/acute) 33. Modic Type Changes 7. Demineralization 8. Bone pathology 9. Central, Lateral Recess, and/or Foraminal Stenosis (include AP diameter of stenosis in mm) 10. Surgical changes (hardware type, status, and presence of fibrosis)  NOTE: Please specify level(s) and laterality.    Standing Status:   Future    Standing Expiration Date:   12/05/2019    Order Specific Question:   What is the patient's sedation requirement?    Answer:   No Sedation    Order Specific Question:   Does the patient have a pacemaker or implanted devices?    Answer:   No    Order Specific Question:   Preferred imaging location?    Answer:   [facility]  [place] (table limit-350lbs)    Order Specific Question:   Call Results- Best Contact Number?    Answer:   [phone] ([facility])    Order Specific Question:   Radiology Contrast Protocol - do NOT remove file path    Answer:   [email or web address]   Follow-up plan:   Return if symptoms worsen or fail to improve.     status post left L3, L4, L5 RFA on 01/16/2018. right L3, L4, L5 RFA: 04/14/2019; successful, 85% pain relief in lower lumbar  region.     Recent Visits No visits were found meeting these conditions. Showing recent visits within past 90 days and meeting all other requirements Today's Visits Date Type Provider Dept  09/04/19 Office Visit Gillis Santa, MD [facility]  Showing today's visits and meeting all other requirements Future Appointments No visits were found meeting these conditions. Showing future appointments within next 90 days and meeting all other requirements  I discussed the assessment and treatment plan with the patient. The patient was provided an opportunity to ask questions and all were answered. The patient agreed with the plan and demonstrated an understanding of the instructions.  Patient advised to call back or seek an in-person evaluation if the symptoms or condition worsens.  Duration of encounter:48mnutes.  Note by: BGillis Santa MD Date: 09/04/2019; Time: 2:29 PM

## 2019-09-04 NOTE — Patient Instructions (Signed)
An MRI of your lower back was ordered.

## 2019-09-09 ENCOUNTER — Ambulatory Visit: Payer: Medicare PPO

## 2019-09-09 ENCOUNTER — Other Ambulatory Visit: Payer: Self-pay

## 2019-09-09 DIAGNOSIS — R2681 Unsteadiness on feet: Secondary | ICD-10-CM | POA: Diagnosis not present

## 2019-09-09 DIAGNOSIS — M6281 Muscle weakness (generalized): Secondary | ICD-10-CM

## 2019-09-09 NOTE — Therapy (Signed)
[place] MAIN [facility] SERVICES [address] [place], [place], [address] Phone: [phone]   Fax:  [phone]  Physical Therapy Treatment  Patient Details  Name: Susan [name] MRN: [id number] Date of Birth: May 03, 1934 Referring Provider (PT): Dr. Melrose Nakayama   Encounter Date: 09/09/2019   PT End of Session - 09/10/19 1240    Visit Number 18    Number of Visits 25    Date for PT Re-Evaluation 09/16/19    Authorization Type eval 06/24/19, PN: 07/29/19    PT Start Time 1345    PT Stop Time 1430    PT Time Calculation (min) 45 min    Equipment Utilized During Treatment Gait belt    Activity Tolerance Patient tolerated treatment well    Behavior During Therapy [facility] for tasks assessed/performed           Past Medical History:  Diagnosis Date  . Bradycardia   . Chronic back pain   . Coronary artery disease    a. s/p STEMI in 2014 with DES to LAD with staged PCI/DES placement to LCx  . Echoencephalogram abnormality   . Heart murmur   . History of Holter monitoring   . Hyperlipidemia   . Hypertension   . Myocardial infarction ([place])   . Neuromuscular disorder ([facility])    neuropathy  . NSVT (nonsustained ventricular tachycardia) ([place])   . Personal history of chemotherapy    ENDOMETRIAL CA  . Personal history of radiation therapy    ENDOMETRIAL CA  . Uterine cancer ([place])    serrous    Past Surgical History:  Procedure Laterality Date  . ABDOMINAL HYSTERECTOMY    . BREAST EXCISIONAL BIOPSY Right 1956   NEG  . CARDIOVERSION N/A 07/27/2017   Procedure: CARDIOVERSION;  Surgeon: Skeet Latch, MD;  Location: [place];  Service: Cardiovascular;  Laterality: N/A;  . LEFT HEART CATHETERIZATION WITH CORONARY ANGIOGRAM  10/19/2012   Procedure: LEFT HEART CATHETERIZATION WITH CORONARY ANGIOGRAM;  Surgeon: Troy Sine, MD;  Location: [facility] CATH LAB;  Service: Cardiovascular;;  . PERCUTANEOUS CORONARY STENT INTERVENTION (PCI-S)  10/19/2012    Procedure: PERCUTANEOUS CORONARY STENT INTERVENTION (PCI-S);  Surgeon: Troy Sine, MD;  Location: [facility] CATH LAB;  Service: Cardiovascular;;  . PERCUTANEOUS CORONARY STENT INTERVENTION (PCI-S) N/A 10/23/2012   Procedure: PERCUTANEOUS CORONARY STENT INTERVENTION (PCI-S);  Surgeon: Troy Sine, MD;  Location: [facility] CATH LAB;  Service: Cardiovascular;  Laterality: N/A;  . PERIPHERAL VASCULAR CATHETERIZATION N/A 05/05/2015   Procedure: Glori Luis Cath Insertion;  Surgeon: Algernon Huxley, MD;  Location: [place] CV LAB;  Service: Cardiovascular;  Laterality: N/A;  . PORTA CATH REMOVAL N/A 04/05/2017   Procedure: PORTA CATH REMOVAL;  Surgeon: Algernon Huxley, MD;  Location: [place] CV LAB;  Service: Cardiovascular;  Laterality: N/A;    There were no vitals filed for this visit.   Subjective Assessment - 09/09/19 1612    Subjective Pt states that she is doing well today. She feels recovered from her travels. No specific questions or concerns.    Pertinent History Pt reports imbalance related to chemo-induced neuropathy which started 4 years ago. Neuropathy occurs in stocking distribution from knees to feet. Symptoms have been unchanged since that time. She has had one fall in the last 6 months. Denies dizziness or vertigo. No heachaches/migraines. She has a history of chronic back pain and has had a bilateral nerve ablation. Her husband passed away 4 weeks ago after contracting COVID.    How  long can you stand comfortably? 5 minutes    How long can you walk comfortably? 5 or 10 minutes    Patient Stated Goals Strengthen her back muscles to decrease pain and improve her balance    Currently in Pain? Yes    Pain Score 5     Pain Location Back    Pain Orientation Lower    Pain Descriptors / Indicators Aching    Pain Type Chronic pain    Pain Onset More than a month ago    Pain Frequency Constant            TREATMENT   Ther-ex Octane xRide L1/2 x 5 minutes for warm-up during history (4  minutes unbilled);  Quantum BLE leg press 115# x 25, x 20; Quantum BLE heel raises 70# 2 x 20; Sit to stand without UE support and Airex under feet 2 x 10;   Neuromuscular Re-education  Airex NBOS eyes open/closed x 30s each; Airex NBOS horizontal and vertical head turns x 30s each; Airex NBOS alternating 6" step taps without UE support x 10 each; Airex NBOS balloon passes with therapy tech; 1/2 foam roller (flat side up) A/P balance x 30s; 1/2 foam roller (flat side up) heel/toe rocks x 10 each;   Pt educated throughout session about proper posture and technique with exercises. Improved exercise technique, movement at target joints, use of target muscles after min to mod verbal, visual, tactile cues.   Pt demonstrates excellent motivation during session today and has less energy than last session. More time spend today focusing on balance activities but she is able to increase her repetitions on the leg press. She is still waiting for the radiology clinic to contact her about scheduling her MRI. She continues to demonstrate difficulty with ankle strategy during balance activities. Pt encouraged to continue her HEP and tofollow-up as scheduled.Pt will benefit from PT services to address deficits in strength, balance, and mobility in order to return to full function at home.                            PT Short Term Goals - 07/29/19 1452      PT SHORT TERM GOAL #1   Title Pt will be independent with home exercise program in order to improve balance and leg strength to decrease fall risk and improve function at home    Baseline 07/29/19: Pt reports that she performs "every other day"    Time 6    Period Weeks    Status On-going    Target Date 08/06/19             PT Long Term Goals - 07/29/19 1453      PT LONG TERM GOAL #1   Title Pt will improve BERG to at least 50/56 in order to demonstrate clinically significant improvement in balance.     Baseline 06/24/19: 43/56; 07/29/19: 47/56 (achieved initial goal of 3 point change)    Time 12    Period Weeks    Status Revised    Target Date 09/16/19      PT LONG TERM GOAL #2   Title Pt will improve ABC by at least 13% in order to demonstrate clinically significant improvement in balance confidence.    Baseline 06/24/19: 35%; 07/29/19: 55.625%    Time 12    Period Weeks    Status Achieved      PT LONG TERM GOAL #3  Title Pt will increase self-selected 10MWT by at least 0.13 m/s in order to demonstrate clinically significant improvement in community ambulation.    Baseline 06/24/19: 14.3s = 0.70 m/s; 07/29/19: 11.5s = 0.87 m/s;    Time 12    Period Weeks    Status Achieved      PT LONG TERM GOAL #4   Title Pt will improve ABC to greater than 67% in order to demonstrate clinically significant improvement in balance confidence.    Baseline 07/29/19: 55.625%    Time 12    Period Weeks    Status New    Target Date 09/16/19                 Plan - 09/10/19 1240    Clinical Impression Statement Pt demonstrates excellent motivation during session today and has less energy than last session. More time spend today focusing on balance activities but she is able to increase her repetitions on the leg press. She is still waiting for the radiology clinic to contact her about scheduling her MRI. She continues to demonstrate difficulty with ankle strategy during balance activities. Pt encouraged to continue her HEP and to follow-up as scheduled. Pt will benefit from PT services to address deficits in strength, balance, and mobility in order to return to full function at home.    Personal Factors and Comorbidities Age;Comorbidity 3+;Past/Current Experience;Time since onset of injury/illness/exacerbation    Comorbidities Neuropathy, chronic back pain, MI, OA, CAD, HTN    Examination-Activity Limitations Bend;Locomotion Level;Lift;Stairs;Stand    Examination-Participation Restrictions Community  Activity;Laundry;Yard Work    Stability/Clinical Decision Making Unstable/Unpredictable    Rehab Potential Fair    PT Frequency 2x / week    PT Duration 12 weeks    PT Treatment/Interventions ADLs/Self Care Home Management;Aquatic Therapy;Biofeedback;Canalith Repostioning;Cryotherapy;Electrical Stimulation;Iontophoresis 4mg /ml Dexamethasone;Moist Heat;Traction;Ultrasound;DME Instruction;Gait training;Stair training;Functional mobility training;Therapeutic activities;Therapeutic exercise;Balance training;Neuromuscular re-education;Cognitive remediation;Patient/family education;Manual techniques;Passive range of motion;Dry needling;Vestibular;Spinal Manipulations;Joint Manipulations    PT Next Visit Plan Continue with strength and balance exercises during session.    PT Home Exercise Plan Medbridge Access Code: [email or web address]    Consulted and Agree with Plan of Care Patient           Patient will benefit from skilled therapeutic intervention in order to improve the following deficits and impairments:  Abnormal gait, Decreased activity tolerance, Decreased balance, Decreased mobility, Decreased strength, Difficulty walking, Impaired sensation, Pain  Visit Diagnosis: Unsteadiness on feet  Muscle weakness (generalized)     Problem List Patient Active Problem List   Diagnosis Date Noted  . Spondylosis without myelopathy or radiculopathy, lumbar region 09/04/2019  . Chronic pain syndrome 09/04/2019  . Chemotherapy-induced neuropathy ([place]) 04/17/2018  . Neuropathy 04/17/2018  . Chemotherapy-induced peripheral neuropathy ([place]) 03/01/2018  . Persistent atrial fibrillation ([place])   . Port-A-Cath in place 03/28/2017  . Bradycardia 01/18/2017  . Obesity (BMI 30-39.9) 01/17/2017  . Pulmonary nodules 12/05/2016  . Compression fracture of L5 vertebra with delayed healing 11/01/2016  . Sacral insufficiency fracture with delayed healing 11/01/2016  . Sacral insufficiency fracture 09/05/2016  . Absence  of bladder continence 07/21/2015  . Neuropathy associated with cancer ([place]) 06/02/2015  . Carcinoma of endometrium ([place]) 04/02/2015  . Gastro-esophageal reflux disease without esophagitis 02/15/2015  . Osteopenia 08/17/2014  . Hyperlipidemia 02/13/2014  . Kidney stones 10/27/2013  . CA skin, basal cell 06/30/2013  . Spinal stenosis, lumbar region, with neurogenic claudication 06/30/2013  . Arthritis, degenerative 06/30/2013  . Neuralgia neuritis, sciatic nerve 06/30/2013  . Coronary artery  disease 10/25/2012  . Dyslipidemia 10/25/2012  . NSVT (nonsustained ventricular tachycardia) ([place]) 10/25/2012  . Sick sinus syndrome ([place]) 10/25/2012  . Ischemic cardiomyopathy 10/22/2012  . Old anterior wall myocardial infarction 10/20/2012  . Hypertensive heart disease without CHF 10/20/2012   Susan [name] PT, DPT, GCS  Susan [name] 09/10/2019, 12:43 PM  [place] MAIN [facility] SERVICES [address] [place], [place], [address] Phone: [phone]   Fax:  [phone]  Name: Susan [name] MRN: [id number] Date of Birth: 1935/02/18

## 2019-09-10 ENCOUNTER — Inpatient Hospital Stay: Payer: Medicare PPO

## 2019-09-10 ENCOUNTER — Telehealth: Payer: Self-pay | Admitting: *Deleted

## 2019-09-11 ENCOUNTER — Other Ambulatory Visit: Payer: Self-pay

## 2019-09-11 ENCOUNTER — Ambulatory Visit: Payer: Medicare PPO

## 2019-09-11 DIAGNOSIS — R2681 Unsteadiness on feet: Secondary | ICD-10-CM

## 2019-09-11 DIAGNOSIS — M6281 Muscle weakness (generalized): Secondary | ICD-10-CM

## 2019-09-11 NOTE — Therapy (Signed)
[place] MAIN [facility] SERVICES [address] [place], [place], [address] Phone: [phone]   Fax:  [phone]  Physical Therapy Treatment  Patient Details  Name: Susan [name] MRN: [id number] Date of Birth: [birth year] Referring Provider (PT): Dr. Melrose Nakayama   Encounter Date: 09/11/2019   PT End of Session - 09/11/19 1522    Visit Number 19    Number of Visits 25    Date for PT Re-Evaluation 09/16/19    Authorization Type eval 06/24/19, PN: 07/29/19    PT Start Time 1516    PT Stop Time 1600    PT Time Calculation (min) 44 min    Equipment Utilized During Treatment Gait belt    Activity Tolerance Patient tolerated treatment well    Behavior During Therapy [facility] for tasks assessed/performed           Past Medical History:  Diagnosis Date   Bradycardia    Chronic back pain    Coronary artery disease    a. s/p STEMI in 2014 with DES to LAD with staged PCI/DES placement to LCx   Echoencephalogram abnormality    Heart murmur    History of Holter monitoring    Hyperlipidemia    Hypertension    Myocardial infarction ([place])    Neuromuscular disorder (HCC)    neuropathy   NSVT (nonsustained ventricular tachycardia) ([place])    Personal history of chemotherapy    ENDOMETRIAL CA   Personal history of radiation therapy    ENDOMETRIAL CA   Uterine cancer ([facility] [place])    serrous    Past Surgical History:  Procedure Laterality Date   ABDOMINAL HYSTERECTOMY     BREAST EXCISIONAL BIOPSY Right 1956   NEG   CARDIOVERSION N/A 07/27/2017   Procedure: CARDIOVERSION;  Surgeon: Skeet Latch, MD;  Location: [place];  Service: Cardiovascular;  Laterality: N/A;   LEFT HEART CATHETERIZATION WITH CORONARY ANGIOGRAM  10/19/2012   Procedure: LEFT HEART CATHETERIZATION WITH CORONARY ANGIOGRAM;  Surgeon: Troy Sine, MD;  Location: [facility] CATH LAB;  Service: Cardiovascular;;   PERCUTANEOUS CORONARY STENT INTERVENTION (PCI-S)  10/19/2012    Procedure: PERCUTANEOUS CORONARY STENT INTERVENTION (PCI-S);  Surgeon: Troy Sine, MD;  Location: [facility] CATH LAB;  Service: Cardiovascular;;   PERCUTANEOUS CORONARY STENT INTERVENTION (PCI-S) N/A 10/23/2012   Procedure: PERCUTANEOUS CORONARY STENT INTERVENTION (PCI-S);  Surgeon: Troy Sine, MD;  Location: [facility] CATH LAB;  Service: Cardiovascular;  Laterality: N/A;   PERIPHERAL VASCULAR CATHETERIZATION N/A 05/05/2015   Procedure: Glori Luis Cath Insertion;  Surgeon: Algernon Huxley, MD;  Location: [place] CV LAB;  Service: Cardiovascular;  Laterality: N/A;   PORTA CATH REMOVAL N/A 04/05/2017   Procedure: PORTA CATH REMOVAL;  Surgeon: Algernon Huxley, MD;  Location: [place] CV LAB;  Service: Cardiovascular;  Laterality: N/A;    There were no vitals filed for this visit.   Subjective Assessment - 09/11/19 1520    Subjective Pt states that she is doing well today. Chronic back pain is still a 5/10. Lumbar MRI is scheduled for 09/24/19. No specific questions or concerns.    Pertinent History Pt reports imbalance related to chemo-induced neuropathy which started 4 years ago. Neuropathy occurs in stocking distribution from knees to feet. Symptoms have been unchanged since that time. She has had one fall in the last 6 months. Denies dizziness or vertigo. No heachaches/migraines. She has a history of chronic back pain and has had a bilateral nerve ablation. Her husband passed away 4 weeks ago  after contracting COVID.    How long can you stand comfortably? 5 minutes    How long can you walk comfortably? 5 or 10 minutes    Patient Stated Goals Strengthen her back muscles to decrease pain and improve her balance    Currently in Pain? Yes    Pain Score 5     Pain Location Back    Pain Orientation Lower    Pain Descriptors / Indicators Aching    Pain Type Chronic pain    Pain Onset More than a month ago    Pain Frequency Constant               TREATMENT   Ther-ex Octane xRide L1/2 x 5  minutes for warm-up during history (4 minutes unbilled); Quantum BLE leg press 115# x 25, x 20; Sit to stand without UE support and Airex under feet x 10;   Neuromuscular Re-education  1/2 foam roller (flat side up) tandem balance alternating forward LE 2 x 30s; 1/2 foam roller (flat side up) A/P balance x 60s; 1/2 foam roller (flat side up) A/P balance with horizontal and vertical head turns x 30s each; 1/2 foam roller (flat side up) heel/toe rocks x 10 each; Rockerboard balance in A/P orientation x 60s; Rockerboard A/P orientation horizontal and vertical head turns x 30s each; Rockerboard balance in R/L orientation x 60s; Rockerboard R/L orientation horizontal and vertical head turns x 30s each;   Pt educated throughout session about proper posture and technique with exercises. Improved exercise technique, movement at target joints, use of target muscles after min to mod verbal, visual, tactile cues.   Pt demonstrates excellent motivation during session today.  More time spent today focusing on balance activities but she is able to increase her resistance on the leg press. She is demonstrating improved strength/endurance with sit to stands today as well. MRI is scheduled for 09/24/19. Will need updated outcome measures, goals, progress note, and recertification at next visit. She continues to demonstrate difficulty with ankle strategy during balance activities.Pt encouraged to continue her HEP andtofollow-up as scheduled.Pt will benefit from PT services to address deficits in strength, balance, and mobility in order to return to full function at home.                         PT Short Term Goals - 07/29/19 1452      PT SHORT TERM GOAL #1   Title Pt will be independent with home exercise program in order to improve balance and leg strength to decrease fall risk and improve function at home    Baseline 07/29/19: Pt reports that she performs "every other day"     Time 6    Period Weeks    Status On-going    Target Date 08/06/19             PT Long Term Goals - 07/29/19 1453      PT LONG TERM GOAL #1   Title Pt will improve BERG to at least 50/56 in order to demonstrate clinically significant improvement in balance.    Baseline 06/24/19: 43/56; 07/29/19: 47/56 (achieved initial goal of 3 point change)    Time 12    Period Weeks    Status Revised    Target Date 09/16/19      PT LONG TERM GOAL #2   Title Pt will improve ABC by at least 13% in order to demonstrate clinically significant improvement in balance confidence.  Baseline 06/24/19: 35%; 07/29/19: 55.625%    Time 12    Period Weeks    Status Achieved      PT LONG TERM GOAL #3   Title Pt will increase self-selected 10MWT by at least 0.13 m/s in order to demonstrate clinically significant improvement in community ambulation.    Baseline 06/24/19: 14.3s = 0.70 m/s; 07/29/19: 11.5s = 0.87 m/s;    Time 12    Period Weeks    Status Achieved      PT LONG TERM GOAL #4   Title Pt will improve ABC to greater than 67% in order to demonstrate clinically significant improvement in balance confidence.    Baseline 07/29/19: 55.625%    Time 12    Period Weeks    Status New    Target Date 09/16/19                 Plan - 09/11/19 1522    Clinical Impression Statement Pt demonstrates excellent motivation during session today.  More time spent today focusing on balance activities but she is able to increase her resistance on the leg press. She is demonstrating improved strength/endurance with sit to stands today as well. MRI is scheduled for 09/24/19. Will need updated outcome measures, goals, progress note, and recertification at next visit. She continues to demonstrate difficulty with ankle strategy during balance activities. Pt encouraged to continue her HEP and to follow-up as scheduled. Pt will benefit from PT services to address deficits in strength, balance, and mobility in order to return to  full function at home.    Personal Factors and Comorbidities Age;Comorbidity 3+;Past/Current Experience;Time since onset of injury/illness/exacerbation    Comorbidities Neuropathy, chronic back pain, MI, OA, CAD, HTN    Examination-Activity Limitations Bend;Locomotion Level;Lift;Stairs;Stand    Examination-Participation Restrictions Community Activity;Laundry;Yard Work    Stability/Clinical Decision Making Unstable/Unpredictable    Rehab Potential Fair    PT Frequency 2x / week    PT Duration 12 weeks    PT Treatment/Interventions ADLs/Self Care Home Management;Aquatic Therapy;Biofeedback;Canalith Repostioning;Cryotherapy;Electrical Stimulation;Iontophoresis 4mg /ml Dexamethasone;Moist Heat;Traction;Ultrasound;DME Instruction;Gait training;Stair training;Functional mobility training;Therapeutic activities;Therapeutic exercise;Balance training;Neuromuscular re-education;Cognitive remediation;Patient/family education;Manual techniques;Passive range of motion;Dry needling;Vestibular;Spinal Manipulations;Joint Manipulations    PT Next Visit Plan Update outcome measures, goals, progress note, recertification for an additional 4 weeks if able to justify. Waiting for updated lumbar MRI to be performed 09/24/19. Continue with strength and balance exercises during session.    PT Home Exercise Plan Medbridge Access Code: [email or web address]    Consulted and Agree with Plan of Care Patient           Patient will benefit from skilled therapeutic intervention in order to improve the following deficits and impairments:  Abnormal gait, Decreased activity tolerance, Decreased balance, Decreased mobility, Decreased strength, Difficulty walking, Impaired sensation, Pain  Visit Diagnosis: Unsteadiness on feet  Muscle weakness (generalized)     Problem List Patient Active Problem List   Diagnosis Date Noted   Spondylosis without myelopathy or radiculopathy, lumbar region 09/04/2019   Chronic pain syndrome  09/04/2019   Chemotherapy-induced neuropathy ([place]) 04/17/2018   Neuropathy 04/17/2018   Chemotherapy-induced peripheral neuropathy ([place]) 03/01/2018   Persistent atrial fibrillation ([place])    Port-A-Cath in place 03/28/2017   Bradycardia 01/18/2017   Obesity (BMI 30-39.9) 01/17/2017   Pulmonary nodules 12/05/2016   Compression fracture of L5 vertebra with delayed healing 11/01/2016   Sacral insufficiency fracture with delayed healing 11/01/2016   Sacral insufficiency fracture 09/05/2016   Absence of bladder continence 07/21/2015   Neuropathy  associated with cancer ([place]) 06/02/2015   Carcinoma of endometrium ([place]) 04/02/2015   Gastro-esophageal reflux disease without esophagitis 02/15/2015   Osteopenia 08/17/2014   Hyperlipidemia 02/13/2014   Kidney stones 10/27/2013   CA skin, basal cell 06/30/2013   Spinal stenosis, lumbar region, with neurogenic claudication 06/30/2013   Arthritis, degenerative 06/30/2013   Neuralgia neuritis, sciatic nerve 06/30/2013   Coronary artery disease 10/25/2012   Dyslipidemia 10/25/2012   NSVT (nonsustained ventricular tachycardia) ([place]) 10/25/2012   Sick sinus syndrome ([place]) 10/25/2012   Ischemic cardiomyopathy 10/22/2012   Old anterior wall myocardial infarction 10/20/2012   Hypertensive heart disease without CHF 10/20/2012   Lyndel Safe Adelena Desantiago PT, DPT, GCS  Lyndsee Casa 09/11/2019, 4:15 PM  [place] MAIN [facility] SERVICES [address] [place], [place], [address] Phone: [phone]   Fax:  [phone]  Name: MORGANNA STYLES MRN: [id number] Date of Birth: [birth year]

## 2019-09-16 ENCOUNTER — Other Ambulatory Visit: Payer: Self-pay

## 2019-09-16 ENCOUNTER — Ambulatory Visit: Payer: Medicare PPO

## 2019-09-16 DIAGNOSIS — R2681 Unsteadiness on feet: Secondary | ICD-10-CM | POA: Diagnosis not present

## 2019-09-16 DIAGNOSIS — M6281 Muscle weakness (generalized): Secondary | ICD-10-CM

## 2019-09-16 NOTE — Progress Notes (Signed)
Susan [name]  [phone] [phone]  Patient Care Team: [name], MD as PCP - General (Family Medicine) Constance Haw, MD as PCP - Electrophysiology (Cardiology) Troy Sine, MD as PCP - Cardiology (Cardiology) Mellody Drown, MD as Referring Physician (Obstetrics and Gynecology)   Name of the patient: Susan [name]  [id number]  January 21, 1935   Date of visit: 09/17/2019   Oncology History:  Oncology History Overview Note  Endometrial cancer stage IIIc    Carcinoma of endometrium ([place])  04/02/2015 Initial Diagnosis   Carcinoma of endometrium [facility])     Gynecologic Oncology Interval Visit   Referring Provider: Dr Joylene Igo  Chief Concern: stage IIIC1serous endometrial cancer  Subjective:  Susan [name] is a 85 y.o., P3 female, initially seen in consultation from Dr. 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 She was last seen in clinic 02/26/2019.    07/14/2019 She saw UroGynecology on 07/14/2019, Tonye Becket, NP.  Underwent pessary fitting during this encounter, but was unable to fit pessary secondary to vaginal caliber, atrophy, and radiation changes. Recommended follow up with Dr. Stephanie Coup for discussion about urethral bulking.     Today, she reports continued bladder leakage, and unsuccessful pessary fitting. She returned to see Dr. Wendy Poet, urology in [place]. She is scheduled for botox injections with Dr. Wendy Poet on October 01, 2019 at [facility] for hopeful relief of bladder leakage.  Additionally she reports continued, chronic back pain, for which she sees pain management and her PCP.    Gynecologic Oncology  Susan [name] is a 85 y.o. 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  Please see complete details in prior notes.   Path report below shows stage IIIC1 disease.  Washings were negative, but she had 3 positive SLNs in pelvis.  An aortic SLN was negative.     A. Lymph node, sentinel, right external iliac, excision: Minute focus of metastatic carcinoma involving one lymph node (1/1). See note. Size of metastasis: Less than 0.1 mm. Negative for extranodal invasion.  Note: A cytokeratin AE1/3 highlights the metastatic focus.  B. Lymph node, sentinel, right external iliac #2, excision: Metastatic carcinoma involving one lymph node (1/1). See note. Size of metastasis: 0.5 mm. Negative for extranodal invasion. Note: A cytokeratin AE1/3 highlights the metastatic focus.  C. Lymph node, sentinel, right aortic, excision: One benign lymph node (0/1). Note: A cytokeratin AE1/3 immunostain is negative.  D. Lymph node, sentinel, left external iliac, excision:  Metastatic adenocarcinoma involving one lymph node (1/1).   Size of metastasis: 1 cm. Negative for extranodal invasion. Note: A cytokeratin AE1/3 highlights the metastatic focus.  E. 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  Tumor size: 5.1 cm. The tumor invades 2 mm into a 14 mm thick myometrium. Negative for cervical involvement. Lymphovascular invasion is present.  04/28/2015 CA125 82.5   Recommendation to proceed with with carboplatinum and paclitaxel three-four cycles followed by radiation therapy followed by additional 2-3 cycles of chemotherapy. She started on chemotherapy on 05/12/2015 and received 2nd cycle on 06/02/2015. She developed significant neuropathy impairing her quality of life with  complaints  of sensory ataxia, frequent falls, and needs to walk with the help of walker. Chemotherapy was discontinued. Then received 4500 cGy pelvic radiation and 1200 cGy using high dose rate remote afterloading through vaginal cylinder completed 7/17.  08/31/2016 Abdominal and pelvic CT scan revealed no evidence of recurrent disease.  There was a new sacral insufficiency fracture.  She was referred to surgery.  Scans revealed a 3 mm pulmonary nodule.  Repeat chest CT in 3 months was recommended.  Pelvic MRI on 10/23/2016 revealed bilateral sacral insufficiency fractures with possible insufficiency fractures of the left greater than right bilateral iliac wings.  Lumbar spine MRI revealed diffuse degenerative disease.  Decision was made not to perform a sacral plasty.  She was seen by Dr. Deetta Perla on 11/07/2016.  Surgical decompression or fusion was not recommended. Recommendation was made to the pain clinic for treatment (medication, injections, or spinal cord stimulator). She is currently seeing a chiropractor and has had 6 treatments.  Chest CT on 12/05/2016 revealed scattered right lung nodules measuring up to 6 mm (mean diameter) in the right upper lobe along the minor fissure.  Metastatic disease was not excluded.  Recommendation was for follow-up CT in 3-6 months.   CA125 values 04/28/2015 82.5  06/02/2015 22.0 07/14/2015 18.2 10/21/2015 11.7 01/21/2016 16.4 04/25/2016 14.2 08/15/2016 13.6 12/07/2016  16.2 03/08/2017 16.1 09/17/2019  Pending  Seen in clinic on 02/21/2017 by Dr. Theora Gianotti with negative exam.   She has seen Dr. Mike Gip for routine follow-up and had a negative exam. She has had her port removed. She continues to be followed by cardiology for bradycardia. She declined a pacemaker at that time for asymptomatic bradycardia. She developed atrial fibrillation and underwent electrical cardioversion on 07/27/17. She has known lung nodules which showed:   03/06/2017 CT Chest WO  Contrast: IMPRESSION: Stable tiny sub-cm right lung nodules, probably benign. Recommend continued followup by chest CT in 6 months.  She had a prior history of urinary incontinence and was seen by Dr. Matilde Sprang, Urology on 08/13/17. They discussed percutaneous tibial nerve stimulation.   CT C/A/P 12/22/17 IMPRESSION: 1. No acute cardio pulmonary abnormalities.  2. Stable appearance of small pulmonary nodules compared with 12/05/2016. The largest nodule measures 6 mm. Future CT at 18-24 months (from 12/05/2016) is considered optional for low-risk patients, but is recommended for high-risk patients. This recommendation follows the consensus statement: Guidelines for Management of Incidental Pulmonary Nodules Detected on CT Images: From the Fleischner Society 2017; Radiology 2017; 284:228-243. 3. Aortic atherosclerosis and 3 vessel coronary artery atherosclerotic calcifications.   02/26/2019 Seen in clinic on 02/26/2019 with complaints of continued bladder leakage. She was referred to [place] after this visit.     Problem List: Patient Active Problem List   Diagnosis Date Noted  . Spondylosis without myelopathy or radiculopathy, lumbar region 09/04/2019  . Chronic pain syndrome 09/04/2019  . Chemotherapy-induced neuropathy ([place]) 04/17/2018  . Neuropathy 04/17/2018  . Chemotherapy-induced peripheral neuropathy ([place]) 03/01/2018  . Persistent atrial fibrillation ([place])   . Port-A-Cath in place 03/28/2017  . Bradycardia 01/18/2017  . Obesity (BMI 30-39.9) 01/17/2017  . Pulmonary nodules 12/05/2016  . Compression fracture of L5 vertebra with delayed healing 11/01/2016  . Sacral insufficiency fracture with delayed healing 11/01/2016  . Sacral insufficiency fracture 09/05/2016  . Absence of bladder continence 07/21/2015  . Neuropathy associated with cancer ([place]) 06/02/2015  . Carcinoma of endometrium ([place]) 04/02/2015  . Gastro-esophageal reflux disease without esophagitis 02/15/2015  .  Osteopenia 08/17/2014  .  Hyperlipidemia 02/13/2014  . Kidney stones 10/27/2013  . CA skin, basal cell 06/30/2013  . Spinal stenosis, lumbar region, with neurogenic claudication 06/30/2013  . Arthritis, degenerative 06/30/2013  . Neuralgia neuritis, sciatic nerve 06/30/2013  . Coronary artery disease 10/25/2012  . Dyslipidemia 10/25/2012  . NSVT (nonsustained ventricular tachycardia) ([place]) 10/25/2012  . Sick sinus syndrome ([place]) 10/25/2012  . Ischemic cardiomyopathy 10/22/2012  . Old anterior wall myocardial infarction 10/20/2012  . Hypertensive heart disease without CHF 10/20/2012    Past Medical History: Past Medical History:  Diagnosis Date  . Bradycardia   . Chronic back pain   . Coronary artery disease    a. s/p STEMI in 2014 with DES to LAD with staged PCI/DES placement to LCx  . Echoencephalogram abnormality   . Heart murmur   . History of Holter monitoring   . Hyperlipidemia   . Hypertension   . Myocardial infarction ([place])   . Neuromuscular disorder ([facility])    neuropathy  . NSVT (nonsustained ventricular tachycardia) ([place])   . Personal history of chemotherapy    ENDOMETRIAL CA  . Personal history of radiation therapy    ENDOMETRIAL CA  . Uterine cancer ([place])    serrous    Past Surgical History: Past Surgical History:  Procedure Laterality Date  . ABDOMINAL HYSTERECTOMY    . BREAST EXCISIONAL BIOPSY Right 1956   NEG  . CARDIOVERSION N/A 07/27/2017   Procedure: CARDIOVERSION;  Surgeon: Skeet Latch, MD;  Location: [facility];  Service: Cardiovascular;  Laterality: N/A;  . LEFT HEART CATHETERIZATION WITH CORONARY ANGIOGRAM  10/19/2012   Procedure: LEFT HEART CATHETERIZATION WITH CORONARY ANGIOGRAM;  Surgeon: Troy Sine, MD;  Location: [facility] CATH LAB;  Service: Cardiovascular;;  . PERCUTANEOUS CORONARY STENT INTERVENTION (PCI-S)  10/19/2012   Procedure: PERCUTANEOUS CORONARY STENT INTERVENTION (PCI-S);  Surgeon: Troy Sine, MD;  Location: [facility] CATH LAB;   Service: Cardiovascular;;  . PERCUTANEOUS CORONARY STENT INTERVENTION (PCI-S) N/A 10/23/2012   Procedure: PERCUTANEOUS CORONARY STENT INTERVENTION (PCI-S);  Surgeon: Troy Sine, MD;  Location: [facility] CATH LAB;  Service: Cardiovascular;  Laterality: N/A;  . PERIPHERAL VASCULAR CATHETERIZATION N/A 05/05/2015   Procedure: Glori Luis Cath Insertion;  Surgeon: Algernon Huxley, MD;  Location: [facility] CV LAB;  Service: Cardiovascular;  Laterality: N/A;  . PORTA CATH REMOVAL N/A 04/05/2017   Procedure: PORTA CATH REMOVAL;  Surgeon: Algernon Huxley, MD;  Location: [facility] CV LAB;  Service: Cardiovascular;  Laterality: N/A;    Family History: Family History  Problem Relation Age of Onset  . Heart disease Mother   . Breast cancer Mother 56  . Heart disease Father   . Diabetes Father   . Diabetes Brother   . Stroke Brother   . Kidney cancer Neg Hx   . Bladder Cancer Neg Hx     Social History: Social History   Socioeconomic History  . Marital status: Widowed    Spouse name: Not on file  . Number of children: Not on file  . Years of education: Not on file  . Highest education level: Not on file  Occupational History  . Not on file  Tobacco Use  . Smoking status: Former Smoker    Quit date: 1976    Years since quitting: 45.5  . Smokeless tobacco: Never Used  Vaping Use  . Vaping Use: Never used  Substance and Sexual Activity  . Alcohol use: Yes    Alcohol/week: 1.0 standard drink    Types: 1 Glasses of wine per  week  . Drug use: No  . Sexual activity: Yes  Other Topics Concern  . Not on file  Social History Narrative  . Not on file   Social Determinants of Health   Financial Resource Strain:   . Difficulty of Paying Living Expenses:   Food Insecurity:   . Worried About Charity fundraiser in the Last Year:   . Arboriculturist in the Last Year:   Transportation Needs:   . Film/video editor (Medical):   [name] Lack of Transportation (Non-Medical):   Physical Activity:   .  Days of Exercise per Week:   . Minutes of Exercise per Session:   Stress:   . Feeling of Stress :   Social Connections:   . Frequency of Communication with Friends and Family:   . Frequency of Social Gatherings with Friends and Family:   . Attends Religious Services:   . Active Member of Clubs or Organizations:   . Attends Archivist Meetings:   [name] Marital Status:   Intimate Partner Violence:   . Fear of Current or Ex-Partner:   . Emotionally Abused:   [name] Physically Abused:   . Sexually Abused:     Allergies: Allergies  Allergen Reactions  . Ace Inhibitors Cough  . Penicillins Rash and Other (See Comments)    Has patient had a PCN reaction causing immediate rash, facial/tongue/throat swelling, SOB or lightheadedness with hypotension: No Has patient had a PCN reaction causing severe rash involving mucus membranes or skin necrosis: No Has patient had a PCN reaction that required hospitalization: No Has patient had a PCN reaction occurring within the last 10 years: No If all of the above answers are "NO", then may proceed with Cephalosporin use.     Current Medications: Current Outpatient Medications  Medication Instructions  . acetaminophen (TYLENOL) 650 mg, Oral, Every 8 hours PRN  . aspirin EC 81 mg, Daily  . atorvastatin (LIPITOR) 80 MG tablet TAKE 1 TABLET (80 MG TOTAL) BY MOUTH DAILY AT 6 PM.  . ciprofloxacin (CIPRO) 250 MG tablet Take one tablet twice a day before, day of procedure and day after  . ELIQUIS 5 MG TABS tablet TAKE 1 TABLET BY MOUTH TWICE A DAY  . ezetimibe (ZETIA) 10 MG tablet TAKE 1 TABLET BY MOUTH EVERY DAY  . fluticasone (FLONASE) 50 MCG/ACT nasal spray SPRAY 2 SPRAYS INTO EACH NOSTRIL EVERY DAY  . furosemide (LASIX) 20 mg, Oral, Daily  . isosorbide mononitrate (IMDUR) 30 mg, Oral, Daily  . loperamide (IMODIUM A-D) 2 mg, Oral, 4 times daily PRN  . losartan (COZAAR) 25 MG tablet TAKE 2 TABLETS BY MOUTH EVERY DAY  . mirabegron ER (MYRBETRIQ) 50  mg, Oral, Daily  . Multiple Vitamin (MULTIVITAMIN WITH MINERALS) TABS 1 tablet, Daily  . NITROSTAT 0.4 MG SL tablet PLACE 1 TABLET UNDER THE TOUGUE EVERY 5 MINUTES FOR UP TO 3 DOSES AS NEEDED FOR CHEST PAIN  . omeprazole (PRILOSEC) 20 mg, Daily  . tiZANidine (ZANAFLEX) 4 mg, Oral, Daily at bedtime  . vitamin B-12 (CYANOCOBALAMIN) 1,000 mcg, Oral, Daily       Review of Systems General: no complaints  HEENT: no complaints  Lungs: no complaints  Cardiac: no complaints  GI: no complaints  GU: reports bladder leakage, incontinence.   Musculoskeletal: no complaints  Extremities: no complaints  Skin: no complaints  Neuro: no complaints  Endocrine: no complaints  Psych: no complaints         Objective:  Physical Examination:  BP 98/60   Pulse 65   Temp (!) 96.8 F (36 C) (Tympanic)   Resp 16   Wt 96.2 kg   BMI 36.39 kg/m    ECOG Performance Status: 1 - Symptomatic but completely ambulatory  EXAM:  GENERAL: Patient is a well appearing female in no acute distress HEENT:  Sclerae anicteric.  Oropharynx clear and moist. No ulcerations or evidence of oropharyngeal candidiasis. Neck is supple.  NODES:  No cervical, supraclavicular, or axillary lymphadenopathy palpated.  LUNGS:  Clear to auscultation bilaterally.  No wheezes or rhonchi. HEART:  Regular rate and rhythm. No murmur appreciated. ABDOMEN:  Soft, nontender.  Positive, normoactive bowel sounds. No organomegaly palpated. MSK:  No focal spinal tenderness to palpation. Full range of motion bilaterally in the upper extremities. Ambulates with Cane EXTREMITIES:  No peripheral edema.   SKIN:  Clear with no obvious rashes or skin changes. No nail dyscrasia. NEURO:  Nonfocal. Well oriented.  Appropriate affect. BREAST: Breasts appear normal, no suspicious masses, no skin or nipple changes or axillary nodes   PELVIC: Exam Chaperoned by RN.  Vulva: normal appearing vulva with no masses, tenderness or lesions; She is leaking  urine with movement down the table.  Vagina: atrophic; no lesions, vaginal shortening;  BME: no masses;  Uterus/Cervix:surgically absent; Rectal: confirmatory   Assessment:  LOYALTY ARENTZ is a 85 y.o. 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 NED today on exam.  CT scan 9/19 normal and no change in indeterminate lung lesions.   Urinary incontinence that is better with oxybutin, but still severe and limiting activities. Probably related to radiation, as she had both external and brachytherapy. She saw [facility] and pessary placed, but could not be retained.  They recommended botox injections and these will be done by Urology in [place].  Peripheral neuropathy due to taxane chemotherapy.  Follow up with neurology for continued management with neurontin.   Back pain: will continue with her current treating providers.    Plan:  She will continue follow up with Dr. Tasia Catchings at the [facility] cancer center in 6 months.   She is scheduled with Dr. Wendy Poet, Urology, for a procedure on 10/01/2019.    CA125 today.  RTC in 1 year to see [place].  [place], Student FNP   I personally interviewed and examined the patient. Agreed with the above/below plan of care. I have directly contributed to assessment and plan of care of this patient and educated and discussed with patient and family.  Mellody Drown, MD   COSIGNER: Dr. Mellody Drown   Mellody Drown, MD

## 2019-09-16 NOTE — Therapy (Signed)
[place] MAIN [facility] SERVICES [address] [place], [place], [address] Phone: [phone]   Fax:  [phone]  Physical Therapy Progress Note   Dates of reporting period  07/31/19   to  09/16/19    Patient Details  Name: Susan [name] MRN: [id number] Date of Birth: [birth year] Referring Provider (PT): Dr. Melrose Nakayama   Encounter Date: 09/16/2019   PT End of Session - 09/16/19 1532    Visit Number 20    Number of Visits 32    Date for PT Re-Evaluation 11/11/19    Authorization Type eval 06/24/19, PN: 07/29/19; 09/16/19-11/11/19    PT Start Time 1445    PT Stop Time 1530    PT Time Calculation (min) 45 min    Activity Tolerance Patient tolerated treatment well;Patient limited by pain    Behavior During Therapy [facility] for tasks assessed/performed           Past Medical History:  Diagnosis Date  . Bradycardia   . Chronic back pain   . Coronary artery disease    a. s/p STEMI in 2014 with DES to LAD with staged PCI/DES placement to LCx  . Echoencephalogram abnormality   . Heart murmur   . History of Holter monitoring   . Hyperlipidemia   . Hypertension   . Myocardial infarction ([place])   . Neuromuscular disorder (HCC)    neuropathy  . NSVT (nonsustained ventricular tachycardia) ([place])   . Personal history of chemotherapy    ENDOMETRIAL CA  . Personal history of radiation therapy    ENDOMETRIAL CA  . Uterine cancer ([place])    serrous    Past Surgical History:  Procedure Laterality Date  . ABDOMINAL HYSTERECTOMY    . BREAST EXCISIONAL BIOPSY Right 1956   NEG  . CARDIOVERSION N/A 07/27/2017   Procedure: CARDIOVERSION;  Surgeon: Skeet Latch, MD;  Location: [place];  Service: Cardiovascular;  Laterality: N/A;  . LEFT HEART CATHETERIZATION WITH CORONARY ANGIOGRAM  10/19/2012   Procedure: LEFT HEART CATHETERIZATION WITH CORONARY ANGIOGRAM;  Surgeon: Troy Sine, MD;  Location: [facility] CATH LAB;  Service: Cardiovascular;;  . PERCUTANEOUS  CORONARY STENT INTERVENTION (PCI-S)  10/19/2012   Procedure: PERCUTANEOUS CORONARY STENT INTERVENTION (PCI-S);  Surgeon: Troy Sine, MD;  Location: [facility] CATH LAB;  Service: Cardiovascular;;  . PERCUTANEOUS CORONARY STENT INTERVENTION (PCI-S) N/A 10/23/2012   Procedure: PERCUTANEOUS CORONARY STENT INTERVENTION (PCI-S);  Surgeon: Troy Sine, MD;  Location: [facility] CATH LAB;  Service: Cardiovascular;  Laterality: N/A;  . PERIPHERAL VASCULAR CATHETERIZATION N/A 05/05/2015   Procedure: Glori Luis Cath Insertion;  Surgeon: Algernon Huxley, MD;  Location: [place] CV LAB;  Service: Cardiovascular;  Laterality: N/A;  . PORTA CATH REMOVAL N/A 04/05/2017   Procedure: PORTA CATH REMOVAL;  Surgeon: Algernon Huxley, MD;  Location: [place] CV LAB;  Service: Cardiovascular;  Laterality: N/A;    There were no vitals filed for this visit.   Subjective Assessment - 09/16/19 1456    Subjective Pt reports she continues to notice improvements with her balance since starting PT here. She reports less dependence on devices when in the home (for balance), less dependence on hands for stability, and reports feeling more confident with her mobility needs. Pt reports recently being recommended to see a vascular specialist due to some decreased perfusion in feet. Pt has started using an AFO on left since beginning therapy due to some foot drop, pt reports it is helping with foot clearance with stairs and curbs.  Pt says she would like ot continue to work her stability and safety with mobility.    Pertinent History Pt reports imbalance related to chemo-induced neuropathy which started 4 years ago. Neuropathy occurs in stocking distribution from knees to feet. Symptoms have been unchanged since that time. She has had one fall in the last 6 months. Denies dizziness or vertigo. No heachaches/migraines. She has a history of chronic back pain and has had a bilateral nerve ablation. Her husband passed away 4 weeks ago after contracting COVID.     Limitations Standing;Walking    How long can you stand comfortably? 5 minutes, (unchanges since last visit) limited due to back pain    How long can you walk comfortably? 5-10 minutes, (unchanges since last visit) limited due to back pain    Patient Stated Goals Strengthen her back muscles to decrease pain and improve her balance    Currently in Pain? Yes    Pain Score 5               [facility] PT Assessment - 09/16/19 0001      Assessment   Medical Diagnosis Neuropathy    Referring Provider (PT) Dr. Melrose Nakayama    Onset Date/Surgical Date 06/25/15   Approximate   Next MD Visit in 3 months with neurology    Prior Therapy None for balance      Observation/Other Assessments   Focus on Therapeutic Outcomes (FOTO)  57/100   40 at evaluation; 49 at 1st reassessment     Balance   Balance Assessed Yes      Berg Balance Test   Sit to Stand Able to stand without using hands and stabilize independently    Standing Unsupported Able to stand safely 2 minutes    Sitting with Back Unsupported but Feet Supported on Floor or Stool Able to sit safely and securely 2 minutes    Stand to Sit Sits safely with minimal use of hands    Transfers Able to transfer safely, minor use of hands    Standing Unsupported with Eyes Closed Able to stand 10 seconds safely    Standing Unsupported with Feet Together Able to place feet together independently and stand for 1 minute with supervision    From Standing, Reach Forward with Outstretched Arm Can reach forward >5 cm safely (2")    From Standing Position, Pick up Object from [place] to pick up shoe safely and easily    From Standing Position, Turn to Look Behind Over each Shoulder Turn sideways only but maintains balance    Turn 360 Degrees Able to turn 360 degrees safely in 4 seconds or less    Standing Unsupported, Alternately Place Feet on Step/Stool Able to stand independently and safely and complete 8 steps in 20 seconds   16.8sec   Standing Unsupported,  One Foot in Front Able to plae foot ahead of the other independently and hold 30 seconds   can perform right tandem for 30 seconds a LOB   Standing on One Leg Tries to lift leg/unable to hold 3 seconds but remains standing independently    Total Score 47          This date: -10MWT x2 -BBT -Foto survey    PT Short Term Goals - 07/29/19 1452      PT SHORT TERM GOAL #1   Title Pt will be independent with home exercise program in order to improve balance and leg strength to decrease fall risk and improve function at  home    Baseline 07/29/19: Pt reports that she performs "every other day"    Time 6    Period Weeks    Status On-going    Target Date 08/06/19             PT Long Term Goals - 09/16/19 1524      PT LONG TERM GOAL #1   Title Pt will improve BERG to at least 50/56 in order to demonstrate clinically significant improvement in balance.    Baseline 06/24/19: 43/56; 07/29/19: 47/56; 6/22: 47/56 (unchanged)    Time 12    Period Weeks    Status On-going    Target Date 09/16/19      PT LONG TERM GOAL #2   Title Pt will improve ABC by at least 13% in order to demonstrate clinically significant improvement in balance confidence.    Baseline 06/24/19: 35%; 07/29/19: 55.625%    Time 12    Period Weeks    Status Achieved    Target Date 09/16/19      PT LONG TERM GOAL #3   Title Pt will increase self-selected 10MWT by at least 0.13 m/s in order to demonstrate clinically significant improvement in community ambulation.    Baseline 06/24/19: 14.3s = 0.70 m/s; 07/29/19: 11.5s = 0.87 m/s; on 6/22: 1.3m/s    Time 12    Period Weeks    Status Achieved    Target Date 09/16/19      PT LONG TERM GOAL #4   Title Pt will improve ABC to greater than 67% in order to demonstrate clinically significant improvement in balance confidence.    Baseline 07/29/19: 55.625%    Time 12    Period Weeks    Status New    Target Date 09/16/19      PT LONG TERM GOAL #5   Title pt will be able to  demonstrate proper lifting technique 3/5 times in order to demonstrate improved balance and decrease risk fo falls.     Baseline 5/5 on 12/16/15    Time 4    Period Weeks    Status Achieved    Target Date 09/11/19      Additional Long Term Goals   Additional Long Term Goals Yes      PT LONG TERM GOAL #6   Title Pt will tolerated >1216ft in 6MWT to improve tolerance to AMB in the community for IADL performance.    Baseline Unable to tolerate more than 5 minutes AMB due to back pain.    Time 8    Period Weeks    Status New    Target Date 11/11/19                 Plan - 09/16/19 1533    Clinical Impression Statement Reassessment performed this date. Pt showing sustained progress in balance improvements AEB score of BBT, but no improvement since last visit. 10MWT demonstrates large improvement in maximal AMB speed, but sustained AMB is still heavily limited by pain. Balance remain most challenged by use of foot drop. Would benefit from more advanced gait/balance training tasks to finish training for AFO use.    Personal Factors and Comorbidities Age;Comorbidity 3+;Past/Current Experience;Time since onset of injury/illness/exacerbation    Comorbidities Neuropathy, chronic back pain, MI, OA, CAD, HTN    Examination-Activity Limitations Bend;Locomotion Level;Lift;Stairs;Stand    Examination-Participation Restrictions Community Activity;Laundry;Yard Work    Merchant navy officer Unstable/Unpredictable    Civil Service fast streamer Potential  Fair    PT Frequency 2x / week    PT Duration 12 weeks    PT Treatment/Interventions ADLs/Self Care Home Management;Aquatic Therapy;Biofeedback;Canalith Repostioning;Cryotherapy;Electrical Stimulation;Iontophoresis 4mg /ml Dexamethasone;Moist Heat;Traction;Ultrasound;DME Instruction;Gait training;Stair training;Functional mobility training;Therapeutic activities;Therapeutic exercise;Balance training;Neuromuscular  re-education;Cognitive remediation;Patient/family education;Manual techniques;Passive range of motion;Dry needling;Vestibular;Spinal Manipulations;Joint Manipulations    PT Next Visit Plan Update outcome measures, goals, progress note, recertification for an additional 4 weeks if able to justify. Waiting for updated lumbar MRI to be performed 09/24/19. Continue with strength and balance exercises during session.    PT Home Exercise Plan Medbridge Access Code: [email or web address]    Consulted and Agree with Plan of Care Patient           Patient will benefit from skilled therapeutic intervention in order to improve the following deficits and impairments:  Abnormal gait, Decreased activity tolerance, Decreased balance, Decreased mobility, Decreased strength, Difficulty walking, Impaired sensation, Pain  Visit Diagnosis: Unsteadiness on feet  Muscle weakness (generalized)     Problem List Patient Active Problem List   Diagnosis Date Noted  . Spondylosis without myelopathy or radiculopathy, lumbar region 09/04/2019  . Chronic pain syndrome 09/04/2019  . Chemotherapy-induced neuropathy ([place]) 04/17/2018  . Neuropathy 04/17/2018  . Chemotherapy-induced peripheral neuropathy ([place]) 03/01/2018  . Persistent atrial fibrillation ([place])   . Port-A-Cath in place 03/28/2017  . Bradycardia 01/18/2017  . Obesity (BMI 30-39.9) 01/17/2017  . Pulmonary nodules 12/05/2016  . Compression fracture of L5 vertebra with delayed healing 11/01/2016  . Sacral insufficiency fracture with delayed healing 11/01/2016  . Sacral insufficiency fracture 09/05/2016  . Absence of bladder continence 07/21/2015  . Neuropathy associated with cancer ([place]) 06/02/2015  . Carcinoma of endometrium ([place]) 04/02/2015  . Gastro-esophageal reflux disease without esophagitis 02/15/2015  . Osteopenia 08/17/2014  . Hyperlipidemia 02/13/2014  . Kidney stones 10/27/2013  . CA skin, basal cell 06/30/2013  . Spinal stenosis, lumbar region, with  neurogenic claudication 06/30/2013  . Arthritis, degenerative 06/30/2013  . Neuralgia neuritis, sciatic nerve 06/30/2013  . Coronary artery disease 10/25/2012  . Dyslipidemia 10/25/2012  . NSVT (nonsustained ventricular tachycardia) ([place]) 10/25/2012  . Sick sinus syndrome ([place]) 10/25/2012  . Ischemic cardiomyopathy 10/22/2012  . Old anterior wall myocardial infarction 10/20/2012  . Hypertensive heart disease without CHF 10/20/2012  6:15 PM, 09/16/19 Etta Grandchild, PT, DPT Physical Therapist - [place] [phone]      Etta Grandchild 09/16/2019, 3:35 PM  [place] MAIN [facility] SERVICES [address] [place], [place], [address] Phone: [phone]   Fax:  [phone]  Name: Susan [name] MRN: [id number] Date of Birth: 02-09-1935

## 2019-09-17 ENCOUNTER — Inpatient Hospital Stay: Payer: Medicare PPO | Attending: Obstetrics and Gynecology | Admitting: Obstetrics and Gynecology

## 2019-09-17 ENCOUNTER — Inpatient Hospital Stay: Payer: Medicare PPO

## 2019-09-17 ENCOUNTER — Other Ambulatory Visit: Payer: Self-pay

## 2019-09-17 VITALS — BP 98/60 | HR 65 | Temp 96.8°F | Resp 16 | Wt 212.0 lb

## 2019-09-17 DIAGNOSIS — R911 Solitary pulmonary nodule: Secondary | ICD-10-CM | POA: Insufficient documentation

## 2019-09-17 DIAGNOSIS — Z7982 Long term (current) use of aspirin: Secondary | ICD-10-CM | POA: Insufficient documentation

## 2019-09-17 DIAGNOSIS — M48062 Spinal stenosis, lumbar region with neurogenic claudication: Secondary | ICD-10-CM | POA: Insufficient documentation

## 2019-09-17 DIAGNOSIS — M47816 Spondylosis without myelopathy or radiculopathy, lumbar region: Secondary | ICD-10-CM | POA: Diagnosis not present

## 2019-09-17 DIAGNOSIS — R32 Unspecified urinary incontinence: Secondary | ICD-10-CM | POA: Insufficient documentation

## 2019-09-17 DIAGNOSIS — M8580 Other specified disorders of bone density and structure, unspecified site: Secondary | ICD-10-CM | POA: Insufficient documentation

## 2019-09-17 DIAGNOSIS — I255 Ischemic cardiomyopathy: Secondary | ICD-10-CM | POA: Insufficient documentation

## 2019-09-17 DIAGNOSIS — E785 Hyperlipidemia, unspecified: Secondary | ICD-10-CM | POA: Insufficient documentation

## 2019-09-17 DIAGNOSIS — I119 Hypertensive heart disease without heart failure: Secondary | ICD-10-CM | POA: Diagnosis not present

## 2019-09-17 DIAGNOSIS — G894 Chronic pain syndrome: Secondary | ICD-10-CM | POA: Insufficient documentation

## 2019-09-17 DIAGNOSIS — M549 Dorsalgia, unspecified: Secondary | ICD-10-CM | POA: Diagnosis not present

## 2019-09-17 DIAGNOSIS — Z9221 Personal history of antineoplastic chemotherapy: Secondary | ICD-10-CM | POA: Diagnosis not present

## 2019-09-17 DIAGNOSIS — Z08 Encounter for follow-up examination after completed treatment for malignant neoplasm: Secondary | ICD-10-CM | POA: Diagnosis not present

## 2019-09-17 DIAGNOSIS — I2510 Atherosclerotic heart disease of native coronary artery without angina pectoris: Secondary | ICD-10-CM | POA: Diagnosis not present

## 2019-09-17 DIAGNOSIS — Z79899 Other long term (current) drug therapy: Secondary | ICD-10-CM | POA: Insufficient documentation

## 2019-09-17 DIAGNOSIS — Z90710 Acquired absence of both cervix and uterus: Secondary | ICD-10-CM | POA: Diagnosis not present

## 2019-09-17 DIAGNOSIS — Z87891 Personal history of nicotine dependence: Secondary | ICD-10-CM | POA: Insufficient documentation

## 2019-09-17 DIAGNOSIS — Z90722 Acquired absence of ovaries, bilateral: Secondary | ICD-10-CM | POA: Insufficient documentation

## 2019-09-17 DIAGNOSIS — R001 Bradycardia, unspecified: Secondary | ICD-10-CM | POA: Diagnosis not present

## 2019-09-17 DIAGNOSIS — C541 Malignant neoplasm of endometrium: Secondary | ICD-10-CM

## 2019-09-17 DIAGNOSIS — Z923 Personal history of irradiation: Secondary | ICD-10-CM | POA: Diagnosis not present

## 2019-09-17 DIAGNOSIS — I700 Atherosclerosis of aorta: Secondary | ICD-10-CM | POA: Diagnosis not present

## 2019-09-17 DIAGNOSIS — Z8542 Personal history of malignant neoplasm of other parts of uterus: Secondary | ICD-10-CM | POA: Insufficient documentation

## 2019-09-17 DIAGNOSIS — I252 Old myocardial infarction: Secondary | ICD-10-CM | POA: Diagnosis not present

## 2019-09-17 DIAGNOSIS — Z7901 Long term (current) use of anticoagulants: Secondary | ICD-10-CM | POA: Diagnosis not present

## 2019-09-17 DIAGNOSIS — K219 Gastro-esophageal reflux disease without esophagitis: Secondary | ICD-10-CM | POA: Diagnosis not present

## 2019-09-17 DIAGNOSIS — G620 Drug-induced polyneuropathy: Secondary | ICD-10-CM

## 2019-09-17 DIAGNOSIS — Z955 Presence of coronary angioplasty implant and graft: Secondary | ICD-10-CM | POA: Insufficient documentation

## 2019-09-17 NOTE — Progress Notes (Signed)
Pt still has neuropathy of feet and bladder leakage. The chemo caused neuropathy and bladder leakage from radiation. Pt going 7/7 to get injections to see if that will help with bladder leakage.

## 2019-09-18 ENCOUNTER — Ambulatory Visit: Payer: Medicare PPO

## 2019-09-18 DIAGNOSIS — M6281 Muscle weakness (generalized): Secondary | ICD-10-CM

## 2019-09-18 DIAGNOSIS — R2681 Unsteadiness on feet: Secondary | ICD-10-CM | POA: Diagnosis not present

## 2019-09-18 NOTE — Therapy (Signed)
[place] MAIN [facility] SERVICES [address] [place], [place], [address] Phone: [phone]   Fax:  [phone]  Physical Therapy Treatment  Patient Details  Name: Susan [name] MRN: [id number] Date of Birth: 07-09-1934 Referring Provider (PT): Dr. Melrose Nakayama   Encounter Date: 09/18/2019   PT End of Session - 09/18/19 1355    Visit Number 21    Number of Visits 32    Date for PT Re-Evaluation 11/11/19    Authorization Type eval 06/24/19, PN: 07/29/19; 09/16/19-11/11/19    PT Start Time 1347    PT Stop Time 1427    PT Time Calculation (min) 40 min    Equipment Utilized During Treatment Gait belt    Activity Tolerance Patient tolerated treatment well;Patient limited by pain    Behavior During Therapy [facility] for tasks assessed/performed           Past Medical History:  Diagnosis Date  . Bradycardia   . Chronic back pain   . Coronary artery disease    a. s/p STEMI in 2014 with DES to LAD with staged PCI/DES placement to LCx  . Echoencephalogram abnormality   . Heart murmur   . History of Holter monitoring   . Hyperlipidemia   . Hypertension   . Myocardial infarction ([place])   . Neuromuscular disorder (HCC)    neuropathy  . NSVT (nonsustained ventricular tachycardia) ([place])   . Personal history of chemotherapy    ENDOMETRIAL CA  . Personal history of radiation therapy    ENDOMETRIAL CA  . Uterine cancer ([place])    serrous    Past Surgical History:  Procedure Laterality Date  . ABDOMINAL HYSTERECTOMY    . BREAST EXCISIONAL BIOPSY Right 1956   NEG  . CARDIOVERSION N/A 07/27/2017   Procedure: CARDIOVERSION;  Surgeon: Skeet Latch, MD;  Location: [place];  Service: Cardiovascular;  Laterality: N/A;  . LEFT HEART CATHETERIZATION WITH CORONARY ANGIOGRAM  10/19/2012   Procedure: LEFT HEART CATHETERIZATION WITH CORONARY ANGIOGRAM;  Surgeon: Troy Sine, MD;  Location: [facility] CATH LAB;  Service: Cardiovascular;;  . PERCUTANEOUS CORONARY  STENT INTERVENTION (PCI-S)  10/19/2012   Procedure: PERCUTANEOUS CORONARY STENT INTERVENTION (PCI-S);  Surgeon: Troy Sine, MD;  Location: [facility] CATH LAB;  Service: Cardiovascular;;  . PERCUTANEOUS CORONARY STENT INTERVENTION (PCI-S) N/A 10/23/2012   Procedure: PERCUTANEOUS CORONARY STENT INTERVENTION (PCI-S);  Surgeon: Troy Sine, MD;  Location: [facility] CATH LAB;  Service: Cardiovascular;  Laterality: N/A;  . PERIPHERAL VASCULAR CATHETERIZATION N/A 05/05/2015   Procedure: Glori Luis Cath Insertion;  Surgeon: Algernon Huxley, MD;  Location: [place] CV LAB;  Service: Cardiovascular;  Laterality: N/A;  . PORTA CATH REMOVAL N/A 04/05/2017   Procedure: PORTA CATH REMOVAL;  Surgeon: Algernon Huxley, MD;  Location: [place] CV LAB;  Service: Cardiovascular;  Laterality: N/A;    There were no vitals filed for this visit.   Subjective Assessment - 09/18/19 1353    Subjective Pt doing well, this date, still having 5/10 low back pain, but reports has MRI scheduled for coming wednesday June 30 to r/o LSS. Pt reports she would like to try acupuncture before surgery if recommended.    Pertinent History Pt reports imbalance related to chemo-induced neuropathy which started 4 years ago. Neuropathy occurs in stocking distribution from knees to feet. Symptoms have been unchanged since that time. She has had one fall in the last 6 months. Denies dizziness or vertigo. No heachaches/migraines. She has a history of chronic back pain  and has had a bilateral nerve ablation. Her husband passed away 4 weeks ago after contracting COVID.    Currently in Pain? Yes    Pain Score 5     Pain Location --   low back paon          INTERVENTION THIS DATE: -ring around the rosie SLS cone taps 1x8 bilat, RUE SPC for LLE stance  -"eggs mcMuffin" STS from chair + dynadisc, feet on airex pad 2x6 -"[place] Toss up"- standing on airex foam (normal stance) alternate self toss/catch with wall rebound at shoulder height 2x10  -Ladder  stepping patterns FWD/Rt/BWD/Rt c reversal -Same as above with c cable resistance (5lb)      PT Short Term Goals - 07/29/19 1452      PT SHORT TERM GOAL #1   Title Pt will be independent with home exercise program in order to improve balance and leg strength to decrease fall risk and improve function at home    Baseline 07/29/19: Pt reports that she performs "every other day"    Time 6    Period Weeks    Status On-going    Target Date 08/06/19             PT Long Term Goals - 09/16/19 1524      PT LONG TERM GOAL #1   Title Pt will improve BERG to at least 50/56 in order to demonstrate clinically significant improvement in balance.    Baseline 06/24/19: 43/56; 07/29/19: 47/56; 6/22: 47/56 (unchanged)    Time 12    Period Weeks    Status On-going    Target Date 09/16/19      PT LONG TERM GOAL #2   Title Pt will improve ABC by at least 13% in order to demonstrate clinically significant improvement in balance confidence.    Baseline 06/24/19: 35%; 07/29/19: 55.625%    Time 12    Period Weeks    Status Achieved    Target Date 09/16/19      PT LONG TERM GOAL #3   Title Pt will increase self-selected 10MWT by at least 0.13 m/s in order to demonstrate clinically significant improvement in community ambulation.    Baseline 06/24/19: 14.3s = 0.70 m/s; 07/29/19: 11.5s = 0.87 m/s; on 6/22: 1.31m/s    Time 12    Period Weeks    Status Achieved    Target Date 09/16/19      PT LONG TERM GOAL #4   Title Pt will improve ABC to greater than 67% in order to demonstrate clinically significant improvement in balance confidence.    Baseline 07/29/19: 55.625%    Time 12    Period Weeks    Status New    Target Date 09/16/19      PT LONG TERM GOAL #5   Title pt will be able to demonstrate proper lifting technique 3/5 times in order to demonstrate improved balance and decrease risk fo falls.     Baseline 5/5 on 12/16/15    Time 4    Period Weeks    Status Achieved    Target Date 09/11/19       Additional Long Term Goals   Additional Long Term Goals Yes      PT LONG TERM GOAL #6   Title Pt will tolerated >1217ft in 6MWT to improve tolerance to AMB in the community for IADL performance.    Baseline Unable to tolerate more than 5 minutes AMB due to back pain.  Time 8    Period Weeks    Status New    Target Date 11/11/19                 Plan - 09/18/19 1357    Clinical Impression Statement Progressed patient from static balance to small movement dynamic balance. Pt very much challenged by foam. Pt allow to use SPC for training when on LLE (AFO limb). Pt able to complete entire session as planned with rest breaks provided as needed. Pt maintains high level of focus and motivation. Extensive verbal, visual, and tactile cues are provided for most accurate form possible. Author provides minA intermittently for full ROM when needed. Overall pt continues to make steady progress toward treatment goals.    Personal Factors and Comorbidities Age;Comorbidity 3+;Past/Current Experience;Time since onset of injury/illness/exacerbation    Comorbidities Neuropathy, chronic back pain, MI, OA, CAD, HTN    Examination-Activity Limitations Bend;Locomotion Level;Lift;Stairs;Stand    Examination-Participation Restrictions Community Activity;Laundry;Yard Work    Stability/Clinical Decision Making Unstable/Unpredictable    Surveyor, mining    Rehab Potential Fair    PT Frequency 2x / week    PT Duration 12 weeks    PT Treatment/Interventions ADLs/Self Care Home Management;Aquatic Therapy;Biofeedback;Canalith Repostioning;Cryotherapy;Electrical Stimulation;Iontophoresis 4mg /ml Dexamethasone;Moist Heat;Traction;Ultrasound;DME Instruction;Gait training;Stair training;Functional mobility training;Therapeutic activities;Therapeutic exercise;Balance training;Neuromuscular re-education;Cognitive remediation;Patient/family education;Manual techniques;Passive range of motion;Dry  needling;Vestibular;Spinal Manipulations;Joint Manipulations    PT Next Visit Plan Update outcome measures, goals, progress note, recertification for an additional 4 weeks if able to justify. Waiting for updated lumbar MRI to be performed 09/24/19. Continue with strength and balance exercises during session.    PT Home Exercise Plan Medbridge Access Code: [email or web address]    Consulted and Agree with Plan of Care Patient           Patient will benefit from skilled therapeutic intervention in order to improve the following deficits and impairments:  Abnormal gait, Decreased activity tolerance, Decreased balance, Decreased mobility, Decreased strength, Difficulty walking, Impaired sensation, Pain  Visit Diagnosis: Unsteadiness on feet  Muscle weakness (generalized)     Problem List Patient Active Problem List   Diagnosis Date Noted  . Spondylosis without myelopathy or radiculopathy, lumbar region 09/04/2019  . Chronic pain syndrome 09/04/2019  . Chemotherapy-induced neuropathy ([place]) 04/17/2018  . Neuropathy 04/17/2018  . Chemotherapy-induced peripheral neuropathy ([place]) 03/01/2018  . Persistent atrial fibrillation ([place])   . Port-A-Cath in place 03/28/2017  . Bradycardia 01/18/2017  . Obesity (BMI 30-39.9) 01/17/2017  . Pulmonary nodules 12/05/2016  . Compression fracture of L5 vertebra with delayed healing 11/01/2016  . Sacral insufficiency fracture with delayed healing 11/01/2016  . Sacral insufficiency fracture 09/05/2016  . Absence of bladder continence 07/21/2015  . Neuropathy associated with cancer ([place]) 06/02/2015  . Carcinoma of endometrium ([place]) 04/02/2015  . Gastro-esophageal reflux disease without esophagitis 02/15/2015  . Osteopenia 08/17/2014  . Hyperlipidemia 02/13/2014  . Kidney stones 10/27/2013  . CA skin, basal cell 06/30/2013  . Spinal stenosis, lumbar region, with neurogenic claudication 06/30/2013  . Arthritis, degenerative 06/30/2013  . Neuralgia neuritis, sciatic  nerve 06/30/2013  . Coronary artery disease 10/25/2012  . Dyslipidemia 10/25/2012  . NSVT (nonsustained ventricular tachycardia) ([place]) 10/25/2012  . Sick sinus syndrome ([place]) 10/25/2012  . Ischemic cardiomyopathy 10/22/2012  . Old anterior wall myocardial infarction 10/20/2012  . Hypertensive heart disease without CHF 10/20/2012   2:31 PM, 09/18/19 Etta Grandchild, PT, DPT Physical Therapist - [place] [phone]  Lavonia Eager C 09/18/2019, 2:06 PM  [place] MAIN [facility] SERVICES [address] [place], [place], [address] Phone: [phone]   Fax:  [phone]  Name: Susan [name] MRN: [id number] Date of Birth: Aug 09, 1934

## 2019-09-19 LAB — CA 125: Cancer Antigen (CA) 125: 14.9 U/mL (ref 0.0–38.1)

## 2019-09-22 ENCOUNTER — Telehealth: Payer: Self-pay | Admitting: Cardiovascular Disease

## 2019-09-22 ENCOUNTER — Encounter: Payer: Self-pay | Admitting: Cardiovascular Disease

## 2019-09-22 NOTE — Telephone Encounter (Signed)
   [facility] Pre-operative Risk Assessment    HEARTCARE STAFF: - Please ensure there is not already an duplicate clearance open for this procedure. - Under Visit Info/Reason for Call, type in Other and utilize the format Clearance MM/DD/YY or Clearance TBD. Do not use dashes or single digits. - If request is for dental extraction, please clarify the # of teeth to be extracted.  Request for surgical clearance:  1. What type of surgery is being performed? Bulkamid Injectable    2. When is this surgery scheduled? 10-01-19   3. What type of clearance is required (medical clearance vs. Pharmacy clearance to hold med vs. Both)? Medicine  4. Are there any medications that need to be held prior to surgery and how long? Can she stop her Eliquis?   5. Practice name and name of physician performing surgery? Dr Nicki Reaper Macdurmid   6. What is the office phone number?[phone]   7.   What is the office fax number? [phone]  8.   Anesthesia type (None, local, MAC, general) ?No Sedation   Susan [name] 09/22/2019, 10:44 AM  _________________________________________________________________   (provider comments below)

## 2019-09-22 NOTE — Telephone Encounter (Signed)
   Primary Cardiologist: Shelva Majestic, MD  Chart reviewed as part of pre-operative protocol coverage. Patient was contacted 09/22/2019 in reference to pre-operative risk assessment for pending surgery as outlined below.  Susan [name] was last seen on 05/29/19 by Dr. Claiborne Billings. She has history of CAD with anterior STEMI 2014 s/p DES to LAD and later Cx, LVEF 40-45% with mild AS (last assessed 06/2017), NSVT on monitor, nocturnal bradycardia (previously saw EP, no PPM pursued), OSA, endometrial cancer, persistent atrial fib, LBBB. She reports she is doing well without any new cardiac symptoms. She shared with me that she lost her husband due to Covid earlier this year. She is grateful that physically she is doing okay. Therefore, based on ACC/AHA guidelines, the patient would be at acceptable risk for the planned procedure without further cardiovascular testing.   Will route to pharmacy for input on holding Eliquis then send bundled rec to requesting provider.  Charlie Pitter, PA-C 09/22/2019, 10:55 AM

## 2019-09-22 NOTE — Telephone Encounter (Signed)
   Primary Cardiologist:Thomas Claiborne Billings, MD  Chart revisited as part of pre-operative protocol coverage. To summarize recommendations:  - Susan [name] would be at acceptable risk for the planned procedure without further cardiovascular testing.   -Per office protocol, patient can hold Eliquis for 2 days prior to procedure. We typically advise that blood thinners be resumed when felt safe by performing physician.  Will route this bundled recommendation to requesting provider via Epic fax function and remove from pre-op pool. Please call with questions.  Charlie Pitter, PA-C 09/22/2019, 12:36 PM

## 2019-09-22 NOTE — Telephone Encounter (Signed)
Error

## 2019-09-22 NOTE — Telephone Encounter (Signed)
Patient with diagnosis of afib on Eliquis for anticoagulation.    Procedure: bulkamid injection Date of procedure: 10/01/19  CHADS2-VASc score of 5 (age x2, sex, HTN, CAD)  CrCl 69mL/min Platelet count 213K  Per office protocol, patient can hold Eliquis for 2 days prior to procedure.

## 2019-09-23 ENCOUNTER — Other Ambulatory Visit: Payer: Self-pay

## 2019-09-23 ENCOUNTER — Ambulatory Visit: Payer: Medicare PPO

## 2019-09-23 DIAGNOSIS — R2681 Unsteadiness on feet: Secondary | ICD-10-CM | POA: Diagnosis not present

## 2019-09-23 DIAGNOSIS — M6281 Muscle weakness (generalized): Secondary | ICD-10-CM

## 2019-09-23 NOTE — Therapy (Signed)
[place] MAIN [facility] SERVICES [address] [place], [place], [address] Phone: [phone]   Fax:  [phone]  Physical Therapy Treatment  Patient Details  Name: Susan [name] MRN: [id number] Date of Birth: June 06, 1934 Referring Provider (PT): Dr. Melrose Nakayama   Encounter Date: 09/23/2019   PT End of Session - 09/23/19 1438    Visit Number 22    Number of Visits 32    Date for PT Re-Evaluation 11/11/19    Authorization Type eval 06/24/19, PN: 07/29/19; 09/16/19-11/11/19    PT Start Time 1431    PT Stop Time 1515    PT Time Calculation (min) 44 min    Equipment Utilized During Treatment Gait belt    Activity Tolerance Patient tolerated treatment well;Patient limited by pain    Behavior During Therapy [facility] for tasks assessed/performed           Past Medical History:  Diagnosis Date  . Bradycardia   . Chronic back pain   . Coronary artery disease    a. s/p STEMI in 2014 with DES to LAD with staged PCI/DES placement to LCx  . Echoencephalogram abnormality   . Heart murmur   . History of Holter monitoring   . Hyperlipidemia   . Hypertension   . Myocardial infarction ([place])   . Neuromuscular disorder (HCC)    neuropathy  . NSVT (nonsustained ventricular tachycardia) ([place])   . Personal history of chemotherapy    ENDOMETRIAL CA  . Personal history of radiation therapy    ENDOMETRIAL CA  . Uterine cancer ([place])    serrous    Past Surgical History:  Procedure Laterality Date  . ABDOMINAL HYSTERECTOMY    . BREAST EXCISIONAL BIOPSY Right 1956   NEG  . CARDIOVERSION N/A 07/27/2017   Procedure: CARDIOVERSION;  Surgeon: Skeet Latch, MD;  Location: [place];  Service: Cardiovascular;  Laterality: N/A;  . LEFT HEART CATHETERIZATION WITH CORONARY ANGIOGRAM  10/19/2012   Procedure: LEFT HEART CATHETERIZATION WITH CORONARY ANGIOGRAM;  Surgeon: Troy Sine, MD;  Location: [facility] CATH LAB;  Service: Cardiovascular;;  . PERCUTANEOUS CORONARY  STENT INTERVENTION (PCI-S)  10/19/2012   Procedure: PERCUTANEOUS CORONARY STENT INTERVENTION (PCI-S);  Surgeon: Troy Sine, MD;  Location: [facility] CATH LAB;  Service: Cardiovascular;;  . PERCUTANEOUS CORONARY STENT INTERVENTION (PCI-S) N/A 10/23/2012   Procedure: PERCUTANEOUS CORONARY STENT INTERVENTION (PCI-S);  Surgeon: Troy Sine, MD;  Location: [facility] CATH LAB;  Service: Cardiovascular;  Laterality: N/A;  . PERIPHERAL VASCULAR CATHETERIZATION N/A 05/05/2015   Procedure: Glori Luis Cath Insertion;  Surgeon: Algernon Huxley, MD;  Location: [place] CV LAB;  Service: Cardiovascular;  Laterality: N/A;  . PORTA CATH REMOVAL N/A 04/05/2017   Procedure: PORTA CATH REMOVAL;  Surgeon: Algernon Huxley, MD;  Location: [place] CV LAB;  Service: Cardiovascular;  Laterality: N/A;    There were no vitals filed for this visit.   Subjective Assessment - 09/23/19 1438    Subjective Pt reports that she was having increased back pain this morning so she took some pain medication. She complains of 3/10 pain upon arrival currently. No specific questions or concerns at this time.    Pertinent History Pt reports imbalance related to chemo-induced neuropathy which started 4 years ago. Neuropathy occurs in stocking distribution from knees to feet. Symptoms have been unchanged since that time. She has had one fall in the last 6 months. Denies dizziness or vertigo. No heachaches/migraines. She has a history of chronic back pain and has had  a bilateral nerve ablation. Her husband passed away 4 weeks ago after contracting COVID.    Currently in Pain? Yes    Pain Score 3     Pain Location Back    Pain Orientation Lower    Pain Descriptors / Indicators Aching    Pain Type Chronic pain    Pain Onset More than a month ago    Pain Frequency Constant              TREATMENT   Neuromuscular Re-education  Octane xRide L1 x 5 minutes for warm-up during history (4 minutes unbilled); Sit to stand without UE support and  Airex under feet 2 x 10; Airex staggered stance balance with front foot on dynadisc x 30s with each foot forward; Airex staggered stance balance with front foot on dynadisc with horizontal head turns x 30 with each foot forward; Airex cone taps with therapist calling out laterality and color of cone x multiple bouts on each leg; Airex 6" step taps without UE support alternating LE x 10 on each side; Airex step-ups from Airex pad to 5" step with Airex on top without UE support x 10 on each side;   Pt educated throughout session about proper posture and technique with exercises. Improved exercise technique, movement at target joints, use of target muscles after min to mod verbal, visual, tactile cues.   Pt demonstrates excellent motivation during session today.  More time spent today focusing on balance activities since pt is complaining of increased back pain upon arrival. Utilized TENS unit on low back throughout session to help with discomfort. Pt demonstrates difficulty with single leg stance activities especially on Airex pad. Pt encouraged to continue her HEP andtofollow-up as scheduled.Pt will benefit from PT services to address deficits in strength, balance, and mobility in order to return to full function at home.                           PT Short Term Goals - 07/29/19 1452      PT SHORT TERM GOAL #1   Title Pt will be independent with home exercise program in order to improve balance and leg strength to decrease fall risk and improve function at home    Baseline 07/29/19: Pt reports that she performs "every other day"    Time 6    Period Weeks    Status On-going    Target Date 08/06/19             PT Long Term Goals - 09/16/19 1524      PT LONG TERM GOAL #1   Title Pt will improve BERG to at least 50/56 in order to demonstrate clinically significant improvement in balance.    Baseline 06/24/19: 43/56; 07/29/19: 47/56; 6/22: 47/56 (unchanged)     Time 12    Period Weeks    Status On-going    Target Date 09/16/19      PT LONG TERM GOAL #2   Title Pt will improve ABC by at least 13% in order to demonstrate clinically significant improvement in balance confidence.    Baseline 06/24/19: 35%; 07/29/19: 55.625%    Time 12    Period Weeks    Status Achieved    Target Date 09/16/19      PT LONG TERM GOAL #3   Title Pt will increase self-selected 10MWT by at least 0.13 m/s in order to demonstrate clinically significant improvement in community ambulation.  Baseline 06/24/19: 14.3s = 0.70 m/s; 07/29/19: 11.5s = 0.87 m/s; on 6/22: 1.11m/s    Time 12    Period Weeks    Status Achieved    Target Date 09/16/19      PT LONG TERM GOAL #4   Title Pt will improve ABC to greater than 67% in order to demonstrate clinically significant improvement in balance confidence.    Baseline 07/29/19: 55.625%    Time 12    Period Weeks    Status New    Target Date 09/16/19      PT LONG TERM GOAL #5   Title pt will be able to demonstrate proper lifting technique 3/5 times in order to demonstrate improved balance and decrease risk fo falls.     Baseline 5/5 on 12/16/15    Time 4    Period Weeks    Status Achieved    Target Date 09/11/19      Additional Long Term Goals   Additional Long Term Goals Yes      PT LONG TERM GOAL #6   Title Pt will tolerated >1273ft in 6MWT to improve tolerance to AMB in the community for IADL performance.    Baseline Unable to tolerate more than 5 minutes AMB due to back pain.    Time 8    Period Weeks    Status New    Target Date 11/11/19                 Plan - 09/23/19 1438    Clinical Impression Statement Pt demonstrates excellent motivation during session today.  More time spent today focusing on balance activities since pt is complaining of increased back pain upon arrival. Utilized TENS unit on low back throughout session to help with discomfort. Pt demonstrates difficulty with single leg stance activities  especially on Airex pad. Pt encouraged to continue her HEP and to follow-up as scheduled. Pt will benefit from PT services to address deficits in strength, balance, and mobility in order to return to full function at home.    Personal Factors and Comorbidities Age;Comorbidity 3+;Past/Current Experience;Time since onset of injury/illness/exacerbation    Comorbidities Neuropathy, chronic back pain, MI, OA, CAD, HTN    Examination-Activity Limitations Bend;Locomotion Level;Lift;Stairs;Stand    Examination-Participation Restrictions Community Activity;Laundry;Yard Work    Stability/Clinical Decision Making Unstable/Unpredictable    Rehab Potential Fair    PT Frequency 2x / week    PT Duration 12 weeks    PT Treatment/Interventions ADLs/Self Care Home Management;Aquatic Therapy;Biofeedback;Canalith Repostioning;Cryotherapy;Electrical Stimulation;Iontophoresis 4mg /ml Dexamethasone;Moist Heat;Traction;Ultrasound;DME Instruction;Gait training;Stair training;Functional mobility training;Therapeutic activities;Therapeutic exercise;Balance training;Neuromuscular re-education;Cognitive remediation;Patient/family education;Manual techniques;Passive range of motion;Dry needling;Vestibular;Spinal Manipulations;Joint Manipulations    PT Next Visit Plan Update outcome measures, goals, progress note, recertification for an additional 4 weeks if able to justify. Waiting for updated lumbar MRI to be performed 09/24/19. Continue with strength and balance exercises during session.    PT Home Exercise Plan Medbridge Access Code: [email or web address]    Consulted and Agree with Plan of Care Patient           Patient will benefit from skilled therapeutic intervention in order to improve the following deficits and impairments:  Abnormal gait, Decreased activity tolerance, Decreased balance, Decreased mobility, Decreased strength, Difficulty walking, Impaired sensation, Pain  Visit Diagnosis: Unsteadiness on feet  Muscle weakness  (generalized)     Problem List Patient Active Problem List   Diagnosis Date Noted  . Spondylosis without myelopathy or radiculopathy, lumbar region 09/04/2019  . Chronic pain syndrome 09/04/2019  .  Chemotherapy-induced neuropathy ([place]) 04/17/2018  . Neuropathy 04/17/2018  . Chemotherapy-induced peripheral neuropathy ([place]) 03/01/2018  . Persistent atrial fibrillation ([place])   . Port-A-Cath in place 03/28/2017  . Bradycardia 01/18/2017  . Obesity (BMI 30-39.9) 01/17/2017  . Pulmonary nodules 12/05/2016  . Compression fracture of L5 vertebra with delayed healing 11/01/2016  . Sacral insufficiency fracture with delayed healing 11/01/2016  . Sacral insufficiency fracture 09/05/2016  . Absence of bladder continence 07/21/2015  . Neuropathy associated with cancer ([place]) 06/02/2015  . Carcinoma of endometrium ([place]) 04/02/2015  . Gastro-esophageal reflux disease without esophagitis 02/15/2015  . Osteopenia 08/17/2014  . Hyperlipidemia 02/13/2014  . Kidney stones 10/27/2013  . CA skin, basal cell 06/30/2013  . Spinal stenosis, lumbar region, with neurogenic claudication 06/30/2013  . Arthritis, degenerative 06/30/2013  . Neuralgia neuritis, sciatic nerve 06/30/2013  . Coronary artery disease 10/25/2012  . Dyslipidemia 10/25/2012  . NSVT (nonsustained ventricular tachycardia) ([place]) 10/25/2012  . Sick sinus syndrome ([place]) 10/25/2012  . Ischemic cardiomyopathy 10/22/2012  . Old anterior wall myocardial infarction 10/20/2012  . Hypertensive heart disease without CHF 10/20/2012   Lyndel Safe Livvy Spilman PT, DPT, GCS  Aurora Rody 09/24/2019, 9:07 AM  [place] MAIN [facility] SERVICES [address] [place], [place], [address] Phone: [phone]   Fax:  [phone]  Name: Susan [name] MRN: [id number] Date of Birth: 1934/09/04

## 2019-09-24 ENCOUNTER — Ambulatory Visit
Admission: RE | Admit: 2019-09-24 | Discharge: 2019-09-24 | Disposition: A | Discharge status: Home / Self Care | Payer: Medicare PPO | Source: Ambulatory Visit | Attending: Student in an Organized Health Care Education/Training Program | Admitting: Student in an Organized Health Care Education/Training Program

## 2019-09-24 ENCOUNTER — Other Ambulatory Visit: Payer: Self-pay

## 2019-09-24 DIAGNOSIS — M48062 Spinal stenosis, lumbar region with neurogenic claudication: Secondary | ICD-10-CM | POA: Diagnosis present

## 2019-09-24 DIAGNOSIS — M47816 Spondylosis without myelopathy or radiculopathy, lumbar region: Secondary | ICD-10-CM | POA: Insufficient documentation

## 2019-09-24 DIAGNOSIS — G894 Chronic pain syndrome: Secondary | ICD-10-CM | POA: Insufficient documentation

## 2019-09-25 ENCOUNTER — Telehealth: Payer: Self-pay | Admitting: Student in an Organized Health Care Education/Training Program

## 2019-09-25 ENCOUNTER — Ambulatory Visit: Payer: Medicare PPO | Attending: Neurology

## 2019-09-25 DIAGNOSIS — M6281 Muscle weakness (generalized): Secondary | ICD-10-CM | POA: Diagnosis present

## 2019-09-25 DIAGNOSIS — R2681 Unsteadiness on feet: Secondary | ICD-10-CM | POA: Insufficient documentation

## 2019-09-25 NOTE — Therapy (Signed)
[place] MAIN [facility] SERVICES [address] [place], [place], [address] Phone: [phone]   Fax:  [phone]  Physical Therapy Treatment  Patient Details  Name: Susan [name] MRN: [id number] Date of Birth: 12-31-1934 Referring Provider (PT): Dr. Melrose Nakayama   Encounter Date: 09/25/2019   PT End of Session - 09/25/19 1609    Visit Number 23    Number of Visits 32    Date for PT Re-Evaluation 11/11/19    Authorization Type eval 06/24/19, PN: 07/29/19; 09/16/19-11/11/19    PT Start Time 1400    PT Stop Time 1445    PT Time Calculation (min) 45 min    Equipment Utilized During Treatment Gait belt    Activity Tolerance Patient tolerated treatment well;Patient limited by pain    Behavior During Therapy [facility] for tasks assessed/performed           Past Medical History:  Diagnosis Date  . Bradycardia   . Chronic back pain   . Coronary artery disease    a. s/p STEMI in 2014 with DES to LAD with staged PCI/DES placement to LCx  . Echoencephalogram abnormality   . Heart murmur   . History of Holter monitoring   . Hyperlipidemia   . Hypertension   . Myocardial infarction ([place])   . Neuromuscular disorder (HCC)    neuropathy  . NSVT (nonsustained ventricular tachycardia) ([place])   . Personal history of chemotherapy    ENDOMETRIAL CA  . Personal history of radiation therapy    ENDOMETRIAL CA  . Uterine cancer ([place])    serrous    Past Surgical History:  Procedure Laterality Date  . ABDOMINAL HYSTERECTOMY    . BREAST EXCISIONAL BIOPSY Right 1956   NEG  . CARDIOVERSION N/A 07/27/2017   Procedure: CARDIOVERSION;  Surgeon: Skeet Latch, MD;  Location: [place];  Service: Cardiovascular;  Laterality: N/A;  . LEFT HEART CATHETERIZATION WITH CORONARY ANGIOGRAM  10/19/2012   Procedure: LEFT HEART CATHETERIZATION WITH CORONARY ANGIOGRAM;  Surgeon: Troy Sine, MD;  Location: [facility] CATH LAB;  Service: Cardiovascular;;  . PERCUTANEOUS CORONARY  STENT INTERVENTION (PCI-S)  10/19/2012   Procedure: PERCUTANEOUS CORONARY STENT INTERVENTION (PCI-S);  Surgeon: Troy Sine, MD;  Location: [facility] CATH LAB;  Service: Cardiovascular;;  . PERCUTANEOUS CORONARY STENT INTERVENTION (PCI-S) N/A 10/23/2012   Procedure: PERCUTANEOUS CORONARY STENT INTERVENTION (PCI-S);  Surgeon: Troy Sine, MD;  Location: [facility] CATH LAB;  Service: Cardiovascular;  Laterality: N/A;  . PERIPHERAL VASCULAR CATHETERIZATION N/A 05/05/2015   Procedure: Glori Luis Cath Insertion;  Surgeon: Algernon Huxley, MD;  Location: [place] CV LAB;  Service: Cardiovascular;  Laterality: N/A;  . PORTA CATH REMOVAL N/A 04/05/2017   Procedure: PORTA CATH REMOVAL;  Surgeon: Algernon Huxley, MD;  Location: [place] CV LAB;  Service: Cardiovascular;  Laterality: N/A;    There were no vitals filed for this visit.   Subjective Assessment - 09/25/19 1607    Subjective Pt reports that she is having 3/10 low back pain upon arrival. She took some pain medications this morning. She had the MRI of her low back yesterday. No specific questions or concerns at this time.    Pertinent History Pt reports imbalance related to chemo-induced neuropathy which started 4 years ago. Neuropathy occurs in stocking distribution from knees to feet. Symptoms have been unchanged since that time. She has had one fall in the last 6 months. Denies dizziness or vertigo. No heachaches/migraines. She has a history of chronic back pain  and has had a bilateral nerve ablation. Her husband passed away 4 weeks ago after contracting COVID.    Limitations Standing;Walking    How long can you stand comfortably? 5 minutes, (unchanges since last visit) limited due to back pain    How long can you walk comfortably? 5-10 minutes, (unchanges since last visit) limited due to back pain    Patient Stated Goals Strengthen her back muscles to decrease pain and improve her balance    Currently in Pain? Yes    Pain Score 3     Pain Location Back     Pain Orientation Lower    Pain Descriptors / Indicators Aching    Pain Type Chronic pain    Pain Onset More than a month ago    Pain Frequency Constant             TREATMENT   Ther-ex Octane xRide L1/2 x 5 minutes for warm-up during history (4 minutes unbilled); Precor BLE leg press 120# 2 x 20; Precor BLE heel raises 75# 2 x 20 Sit to stand without UE support and Airex under feet 2kg overhead ball press x 10;   Neuromuscular Re-education  6" alternating step taps without UE support x 10; 6" Airex alternating step taps without UE support x 10;   Pt educated throughout session about proper posture and technique with exercises. Improved exercise technique, movement at target joints, use of target muscles after min to mod verbal, visual, tactile cues.   Pt demonstrates excellent motivation during session today.  More time spent today focusing on strength activities but she is able to increase her resistance on the leg press. She is demonstrating improved strength/endurance with sit to stands today as well and added overhead medball press. She continues to demonstrate difficulty with single leg balance.Pt encouraged to continue her HEP andtofollow-up as scheduled.Pt will benefit from PT services to address deficits in strength, balance, and mobility in order to return to full function at home.                            PT Short Term Goals - 07/29/19 1452      PT SHORT TERM GOAL #1   Title Pt will be independent with home exercise program in order to improve balance and leg strength to decrease fall risk and improve function at home    Baseline 07/29/19: Pt reports that she performs "every other day"    Time 6    Period Weeks    Status On-going    Target Date 08/06/19             PT Long Term Goals - 09/16/19 1524      PT LONG TERM GOAL #1   Title Pt will improve BERG to at least 50/56 in order to demonstrate clinically significant  improvement in balance.    Baseline 06/24/19: 43/56; 07/29/19: 47/56; 6/22: 47/56 (unchanged)    Time 12    Period Weeks    Status On-going    Target Date 09/16/19      PT LONG TERM GOAL #2   Title Pt will improve ABC by at least 13% in order to demonstrate clinically significant improvement in balance confidence.    Baseline 06/24/19: 35%; 07/29/19: 55.625%    Time 12    Period Weeks    Status Achieved    Target Date 09/16/19      PT LONG TERM GOAL #3  Title Pt will increase self-selected 10MWT by at least 0.13 m/s in order to demonstrate clinically significant improvement in community ambulation.    Baseline 06/24/19: 14.3s = 0.70 m/s; 07/29/19: 11.5s = 0.87 m/s; on 6/22: 1.36m/s    Time 12    Period Weeks    Status Achieved    Target Date 09/16/19      PT LONG TERM GOAL #4   Title Pt will improve ABC to greater than 67% in order to demonstrate clinically significant improvement in balance confidence.    Baseline 07/29/19: 55.625%    Time 12    Period Weeks    Status New    Target Date 09/16/19      PT LONG TERM GOAL #5   Title pt will be able to demonstrate proper lifting technique 3/5 times in order to demonstrate improved balance and decrease risk fo falls.     Baseline 5/5 on 12/16/15    Time 4    Period Weeks    Status Achieved    Target Date 09/11/19      Additional Long Term Goals   Additional Long Term Goals Yes      PT LONG TERM GOAL #6   Title Pt will tolerated >1232ft in 6MWT to improve tolerance to AMB in the community for IADL performance.    Baseline Unable to tolerate more than 5 minutes AMB due to back pain.    Time 8    Period Weeks    Status New    Target Date 11/11/19                 Plan - 09/25/19 1610    Clinical Impression Statement Pt demonstrates excellent motivation during session today.  More time spent today focusing on strength activities but she is able to increase her resistance on the leg press. She is demonstrating improved  strength/endurance with sit to stands today as well and added overhead medball press. She continues to demonstrate difficulty with single leg balance. Pt encouraged to continue her HEP and to follow-up as scheduled. Pt will benefit from PT services to address deficits in strength, balance, and mobility in order to return to full function at home.    Personal Factors and Comorbidities Age;Comorbidity 3+;Past/Current Experience;Time since onset of injury/illness/exacerbation    Comorbidities Neuropathy, chronic back pain, MI, OA, CAD, HTN    Examination-Activity Limitations Bend;Locomotion Level;Lift;Stairs;Stand    Examination-Participation Restrictions Community Activity;Laundry;Yard Work    Stability/Clinical Decision Making Unstable/Unpredictable    Rehab Potential Fair    PT Frequency 2x / week    PT Duration 12 weeks    PT Treatment/Interventions ADLs/Self Care Home Management;Aquatic Therapy;Biofeedback;Canalith Repostioning;Cryotherapy;Electrical Stimulation;Iontophoresis 4mg /ml Dexamethasone;Moist Heat;Traction;Ultrasound;DME Instruction;Gait training;Stair training;Functional mobility training;Therapeutic activities;Therapeutic exercise;Balance training;Neuromuscular re-education;Cognitive remediation;Patient/family education;Manual techniques;Passive range of motion;Dry needling;Vestibular;Spinal Manipulations;Joint Manipulations    PT Next Visit Plan Continue with strength and balance exercises during session.    PT Home Exercise Plan Medbridge Access Code: [email or web address]    Consulted and Agree with Plan of Care Patient           Patient will benefit from skilled therapeutic intervention in order to improve the following deficits and impairments:  Abnormal gait, Decreased activity tolerance, Decreased balance, Decreased mobility, Decreased strength, Difficulty walking, Impaired sensation, Pain  Visit Diagnosis: Unsteadiness on feet  Muscle weakness (generalized)     Problem  List Patient Active Problem List   Diagnosis Date Noted  . Spondylosis without myelopathy or radiculopathy, lumbar region 09/04/2019  . Chronic  pain syndrome 09/04/2019  . Chemotherapy-induced neuropathy ([place]) 04/17/2018  . Neuropathy 04/17/2018  . Chemotherapy-induced peripheral neuropathy ([place]) 03/01/2018  . Persistent atrial fibrillation ([place])   . Port-A-Cath in place 03/28/2017  . Bradycardia 01/18/2017  . Obesity (BMI 30-39.9) 01/17/2017  . Pulmonary nodules 12/05/2016  . Compression fracture of L5 vertebra with delayed healing 11/01/2016  . Sacral insufficiency fracture with delayed healing 11/01/2016  . Sacral insufficiency fracture 09/05/2016  . Absence of bladder continence 07/21/2015  . Neuropathy associated with cancer ([place]) 06/02/2015  . Carcinoma of endometrium ([place]) 04/02/2015  . Gastro-esophageal reflux disease without esophagitis 02/15/2015  . Osteopenia 08/17/2014  . Hyperlipidemia 02/13/2014  . Kidney stones 10/27/2013  . CA skin, basal cell 06/30/2013  . Spinal stenosis, lumbar region, with neurogenic claudication 06/30/2013  . Arthritis, degenerative 06/30/2013  . Neuralgia neuritis, sciatic nerve 06/30/2013  . Coronary artery disease 10/25/2012  . Dyslipidemia 10/25/2012  . NSVT (nonsustained ventricular tachycardia) ([place]) 10/25/2012  . Sick sinus syndrome ([place]) 10/25/2012  . Ischemic cardiomyopathy 10/22/2012  . Old anterior wall myocardial infarction 10/20/2012  . Hypertensive heart disease without CHF 10/20/2012   Lyndel Safe Damone Fancher PT, DPT, GCS  Yulissa Needham 09/26/2019, 10:44 AM  [place] MAIN [facility] SERVICES [address] [place], [place], [address] Phone: [phone]   Fax:  [phone]  Name: Susan [name] MRN: [id number] Date of Birth: [birth year]

## 2019-09-25 NOTE — Telephone Encounter (Signed)
Nurse from [facility] radiology wanted to give stat report on MRI findings on pt. She stated she just needed me to verify that the MRI was in her chart and that we could read it. She stated for me to just let RN know.

## 2019-09-29 NOTE — Progress Notes (Signed)
Cardiology Office Note Date:  09/30/2019  Patient ID:  Susan, [name] March 19, 1935, MRN [id number] PCP:  [name], MD  Cardiologist:  Dr. Claiborne Billings EP: [name]     Chief Complaint: 6 mo EP follow up  History of Present Illness: Susan [name] is a 85 y.o. female with history of CAD (s/p STEMI in 2014 with DES to LAD with staged PCI/DES placement to LCx), HTN, HLD, endometrial cancer (s/p radiation and chemo w/resulting neuropathy), LBBB, severe sleep apnea w/CPAP, ICM  She was seen by [name] for profound brady that was seen on holter monitor that led to hospitalization in October 2018.  At that time, the monitor showed no AF,but there were periods of Mobitz 1 block, rates in the 20s with pauses and junctional escape beats. Her telemetry on that particular day was showing sinus bradycardia in the 40s-50s. No afib appreciated. The patient admitted that she was entirely asymptomatic. At that point the decision was made not to place a permanent pacemaker. SHe was not on AV nodal agents  She last saw [name] Jan 2021, she was doing well, no symptoms though under more stress with her husband's recent hip fracture.  SSSx with no symptoms, persistent Afib with no plans to return to sinus.  Planned for 2 mo follow up.  She did see Dr. Claiborne Billings in March 2021, she was compliant with her CPAP.  Her husband had died of COVID.  Cleared by pre-op team for bladder injection therapy. Scheduled for 10/01/19.  She continues to adjust to her life as a widow, though sounds like she is doing well.  She has a potential trip to the [place] to see her daughter in a couple months and another to [place] with her grand daughter.  She has good community support, and thankfully she and her husband planned well. She has no CP, palpitations or cardiac awareness, no rest SOB or SOB at her level of exertion.  She cares for herself and home, she lives on a senior community.   She denies any dizzy spells,  no near syncope or syncope.  No symptoms of chronotropic incompetence that I can elicit.   Getting PT for her neuropathy and foot drop, recently found with spinal stenosis.  She thinks getting start on CPAP therapy theose years ago had been the biggest help to her.    Past Medical History:  Diagnosis Date  . Bradycardia   . Chronic back pain   . Coronary artery disease    a. s/p STEMI in 2014 with DES to LAD with staged PCI/DES placement to LCx  . Echoencephalogram abnormality   . Heart murmur   . History of Holter monitoring   . Hyperlipidemia   . Hypertension   . Myocardial infarction ([place])   . Neuromuscular disorder (HCC)    neuropathy  . NSVT (nonsustained ventricular tachycardia) ([place])   . Personal history of chemotherapy    ENDOMETRIAL CA  . Personal history of radiation therapy    ENDOMETRIAL CA  . Uterine cancer ([place])    serrous    Past Surgical History:  Procedure Laterality Date  . ABDOMINAL HYSTERECTOMY    . BREAST EXCISIONAL BIOPSY Right 1956   NEG  . CARDIOVERSION N/A 07/27/2017   Procedure: CARDIOVERSION;  Surgeon: Skeet Latch, MD;  Location: [place];  Service: Cardiovascular;  Laterality: N/A;  . LEFT HEART CATHETERIZATION WITH CORONARY ANGIOGRAM  10/19/2012   Procedure: LEFT HEART CATHETERIZATION WITH CORONARY ANGIOGRAM;  Surgeon: Joyice Faster  Claiborne Billings, MD;  Location: [facility] CATH LAB;  Service: Cardiovascular;;  . PERCUTANEOUS CORONARY STENT INTERVENTION (PCI-S)  10/19/2012   Procedure: PERCUTANEOUS CORONARY STENT INTERVENTION (PCI-S);  Surgeon: Troy Sine, MD;  Location: [facility] CATH LAB;  Service: Cardiovascular;;  . PERCUTANEOUS CORONARY STENT INTERVENTION (PCI-S) N/A 10/23/2012   Procedure: PERCUTANEOUS CORONARY STENT INTERVENTION (PCI-S);  Surgeon: Troy Sine, MD;  Location: [facility] CATH LAB;  Service: Cardiovascular;  Laterality: N/A;  . PERIPHERAL VASCULAR CATHETERIZATION N/A 05/05/2015   Procedure: Glori Luis Cath Insertion;  Surgeon: Algernon Huxley, MD;  Location:  [place] CV LAB;  Service: Cardiovascular;  Laterality: N/A;  . PORTA CATH REMOVAL N/A 04/05/2017   Procedure: PORTA CATH REMOVAL;  Surgeon: Algernon Huxley, MD;  Location: [place] CV LAB;  Service: Cardiovascular;  Laterality: N/A;    Current Outpatient Medications  Medication Sig Dispense Refill  . acetaminophen (TYLENOL) 650 MG CR tablet Take 650 mg by mouth every 8 (eight) hours as needed for pain.    [name] aspirin EC 81 MG tablet Take 81 mg by mouth daily.    [name] atorvastatin (LIPITOR) 80 MG tablet TAKE 1 TABLET (80 MG TOTAL) BY MOUTH DAILY AT 6 PM. 90 tablet 3  . ciprofloxacin (CIPRO) 250 MG tablet Take 250 mg by mouth 2 (two) times daily. Twice a day before and day of procedure then stop    . ELIQUIS 5 MG TABS tablet TAKE 1 TABLET BY MOUTH TWICE A DAY 180 tablet 1  . ezetimibe (ZETIA) 10 MG tablet TAKE 1 TABLET BY MOUTH EVERY DAY 90 tablet 1  . fluticasone (FLONASE) 50 MCG/ACT nasal spray SPRAY 2 SPRAYS INTO EACH NOSTRIL EVERY DAY    . furosemide (LASIX) 20 MG tablet Take 20 mg by mouth daily.     . isosorbide mononitrate (IMDUR) 30 MG 24 hr tablet Take 1 tablet (30 mg total) by mouth daily. 90 tablet 3  . loperamide (IMODIUM A-D) 2 MG tablet Take 2 mg by mouth 4 (four) times daily as needed for diarrhea or loose stools.    [name] losartan (COZAAR) 25 MG tablet TAKE 2 TABLETS BY MOUTH EVERY DAY 180 tablet 1  . mirabegron ER (MYRBETRIQ) 50 MG TB24 tablet Take 50 mg by mouth daily.    . Multiple Vitamin (MULTIVITAMIN WITH MINERALS) TABS Take 1 tablet by mouth daily.    [name] NITROSTAT 0.4 MG SL tablet PLACE 1 TABLET UNDER THE TOUGUE EVERY 5 MINUTES FOR UP TO 3 DOSES AS NEEDED FOR CHEST PAIN 25 tablet 0  . omeprazole (PRILOSEC) 20 MG capsule Take 20 mg by mouth daily.    [name] tiZANidine (ZANAFLEX) 4 MG tablet Take 4 mg by mouth at bedtime.    . vitamin B-12 (CYANOCOBALAMIN) 1000 MCG tablet Take 1,000 mcg by mouth daily.     No current facility-administered medications for this visit.    Facility-Administered Medications Ordered in Other Visits  Medication Dose Route Frequency Provider Last Rate Last Admin  . sodium chloride flush (NS) 0.9 % injection 10 mL  10 mL Intravenous PRN Cammie Sickle, MD   10 mL at 06/06/16 1447  . sodium chloride flush (NS) 0.9 % injection 10 mL  10 mL Intravenous PRN Cammie Sickle, MD   10 mL at 08/15/16 1404    Allergies:   Ace inhibitors and Penicillins   Social History:  The patient  reports that she quit smoking about 45 years ago. She has never used smokeless tobacco. She reports current alcohol use  of about 1.0 standard drink of alcohol per week. She reports that she does not use drugs.   Family History:  The patient's family history includes Breast cancer (age of onset: 49) in her mother; Diabetes in her brother and father; Heart disease in her father and mother; Stroke in her brother.  ROS:  Please see the history of present illness.  All other systems are reviewed and otherwise negative.   PHYSICAL EXAM:  VS:  BP 130/66   Pulse 63   Ht 5\' 4"  (1.626 m)   Wt 214 lb (97.1 kg)   SpO2 97%   BMI 36.73 kg/m  BMI: Body mass index is 36.73 kg/m. Well nourished, well developed, in no acute distress  HEENT: normocephalic, atraumatic  Neck: no JVD, carotid bruits or masses Cardiac:  irreg, irreg; soft SM, no, no rubs, or gallops Lungs:  CTA b/l, no wheezing, rhonchi or rales  Abd: soft, nontender MS: no deformity or atrophy Ext: trace-1+ edema (that she says is chronic and unchanged), she has a brace to LLE Skin: warm and dry, no rash Neuro:  No gross deficits appreciated Psych: euthymic mood, full affect    EKG:  Not done today   06/27/2017: TTE Study Conclusions - Procedure narrative: Transthoracic echocardiography. Image  quality was adequate. Intravenous contrast (Definity) was  administered.  - Left ventricle: Septal and apical akinesis No mural apical  thrombus using definity. The cavity size was  moderately dilated.  Wall thickness was increased in a pattern of moderate LVH.  Systolic function was mildly to moderately reduced. The estimated  ejection fraction was in the range of 40% to 45%. The study is  not technically sufficient to allow evaluation of LV diastolic  function.  - Aortic valve: There was mild stenosis. Valve area (VTI): 2.1  cm^2. Valve area (Vmax): 1.61 cm^2. Valve area (Vmean): 1.82  cm^2.  - Left atrium: The atrium was moderately dilated.  - Atrial septum: No defect or patent foramen ovale was identified.  - Pulmonary arteries: PA peak pressure: 36 mm Hg (S).    TTE 01/04/17 - Left ventricle: The cavity size was normal. There was moderate concentric hypertrophy. Systolic function was mildly to moderately reduced. The estimated ejection fraction was in the range of 40% to 45%. Severe hypokinesis of the inferior, inferoseptal, and apical myocardium. Mild anteroseptal and inferolateral hypokinesis. Doppler parameters are consistent with abnormal left ventricular relaxation (grade 1 diastolic dysfunction). Doppler parameters are consistent with high ventricular filling pressure. - Aortic valve: There was mild stenosis. Peak velocity (S): 238 cm/s. Mean gradient (S): 12 mm Hg. Valve area (VTI): 2.7 cm^2. Valve area (Vmax): 2.18 cm^2. Valve area (Vmean): 2.39 cm^2. - Mitral valve: Calcified annulus. Severe thickening and calcification. Transvalvular velocity was within the normal range. There was no evidence for stenosis. There was mild regurgitation. Valve area by continuity equation (using LVOT flow): 3.08 cm^2. - Left atrium: The atrium was severely dilated. - Right ventricle: The cavity size was normal. Wall thickness was normal. Systolic function was normal. - Tricuspid valve: There was no regurgitation. - Pulmonary arteries: Systolic pressure was within the normal range. PA peak pressure: 32 mm Hg (S). -  Pericardium, extracardiac: A trivial pericardial effusion was identified.  Holter 01/17/17  Markedly abnormal monitor demonstrating predominantly underlying atrial fibrillation with slow ventricular rate. The average heart beat was 53 bpm. The maximum heart rate was 109 bpm. During the monitoring period, the patient had significant bradycardia with heart rates down in the mid  to low 30s. Th there were 2 episodes with heart rates below 30 at 25 bpm and at 19 bpm immediately thereafter at 9:57 PM on 01/10/2017. On 01/11/2017 at 7:47 AM heart rate was 24 bpm.   Recent Labs: 02/26/2019: ALT 14; BUN 18; Creatinine, Ser 1.06; Hemoglobin 12.6; Platelets 213; Potassium 3.7; Sodium 137  No results found for requested labs within last 8760 hours.   CrCl cannot be calculated (Patient's most recent lab result is older than the maximum 21 days allowed.).   Wt Readings from Last 3 Encounters:  09/30/19 214 lb (97.1 kg)  09/17/19 212 lb (96.2 kg)  09/04/19 204 lb (92.5 kg)     Other studies reviewed: Additional studies/records reviewed today include: summarized above  ASSESSMENT AND PLAN:  1. SSSx     No symptoms of bradycardia  2. Longstanding persistent AFib     CHA2DS2Vasc is 6, on Eliquis, apprpriately dosed       3. HTN     No changes  4. CAD     No symptoms of angina     On ASA, statin tx, no BB with hx of bradycardia     C/w Dr. Claiborne Billings  5. ICM     No symptoms of volume OL, she has some edema that she reports is chronic and at her basseline     On ARB, diuretic tx, no BB with bradycardia     C/w Dr. Claiborne Billings   Disposition: F/u with Dr. Claiborne Billings as directed by him.  She will see EP as needed  Current medicines are reviewed at length with the patient today.  The patient did not have any concerns regarding medicines.  Venetia Night, PA-C 09/30/2019 2:22 PM     [address] [place] [address] [phone] (office)  [phone] (fax)

## 2019-09-30 ENCOUNTER — Ambulatory Visit: Payer: Medicare PPO | Admitting: Physician Assistant

## 2019-09-30 ENCOUNTER — Ambulatory Visit: Payer: Medicare PPO

## 2019-09-30 ENCOUNTER — Other Ambulatory Visit: Payer: Self-pay

## 2019-09-30 VITALS — BP 130/66 | HR 63 | Ht 64.0 in | Wt 214.0 lb

## 2019-09-30 DIAGNOSIS — R2681 Unsteadiness on feet: Secondary | ICD-10-CM | POA: Diagnosis not present

## 2019-09-30 DIAGNOSIS — I495 Sick sinus syndrome: Secondary | ICD-10-CM | POA: Diagnosis not present

## 2019-09-30 DIAGNOSIS — I2510 Atherosclerotic heart disease of native coronary artery without angina pectoris: Secondary | ICD-10-CM | POA: Diagnosis not present

## 2019-09-30 DIAGNOSIS — I10 Essential (primary) hypertension: Secondary | ICD-10-CM

## 2019-09-30 DIAGNOSIS — I255 Ischemic cardiomyopathy: Secondary | ICD-10-CM | POA: Diagnosis not present

## 2019-09-30 DIAGNOSIS — M6281 Muscle weakness (generalized): Secondary | ICD-10-CM

## 2019-09-30 NOTE — Therapy (Signed)
[place] MAIN [facility] SERVICES [address] [place], [place], [address] Phone: [phone]   Fax:  [phone]  Physical Therapy Treatment  Patient Details  Name: Susan [name] MRN: [id number] Date of Birth: August 26, 1934 Referring Provider (PT): Dr. Melrose Nakayama   Encounter Date: 09/30/2019   PT End of Session - 09/30/19 1610    Visit Number 24    Number of Visits 32    Date for PT Re-Evaluation 11/11/19    Authorization Type eval 06/24/19, PN: 07/29/19; 09/16/19-11/11/19    PT Start Time 1401    PT Stop Time 1445    PT Time Calculation (min) 44 min    Equipment Utilized During Treatment Gait belt    Activity Tolerance Patient tolerated treatment well;Patient limited by pain    Behavior During Therapy [facility] for tasks assessed/performed           Past Medical History:  Diagnosis Date  . Bradycardia   . Chronic back pain   . Coronary artery disease    a. s/p STEMI in 2014 with DES to LAD with staged PCI/DES placement to LCx  . Echoencephalogram abnormality   . Heart murmur   . History of Holter monitoring   . Hyperlipidemia   . Hypertension   . Myocardial infarction ([place])   . Neuromuscular disorder (HCC)    neuropathy  . NSVT (nonsustained ventricular tachycardia) ([place])   . Personal history of chemotherapy    ENDOMETRIAL CA  . Personal history of radiation therapy    ENDOMETRIAL CA  . Uterine cancer ([place])    serrous    Past Surgical History:  Procedure Laterality Date  . ABDOMINAL HYSTERECTOMY    . BREAST EXCISIONAL BIOPSY Right 1956   NEG  . CARDIOVERSION N/A 07/27/2017   Procedure: CARDIOVERSION;  Surgeon: Skeet Latch, MD;  Location: [place];  Service: Cardiovascular;  Laterality: N/A;  . LEFT HEART CATHETERIZATION WITH CORONARY ANGIOGRAM  10/19/2012   Procedure: LEFT HEART CATHETERIZATION WITH CORONARY ANGIOGRAM;  Surgeon: Troy Sine, MD;  Location: [facility] CATH LAB;  Service: Cardiovascular;;  . PERCUTANEOUS CORONARY  STENT INTERVENTION (PCI-S)  10/19/2012   Procedure: PERCUTANEOUS CORONARY STENT INTERVENTION (PCI-S);  Surgeon: Troy Sine, MD;  Location: [facility] CATH LAB;  Service: Cardiovascular;;  . PERCUTANEOUS CORONARY STENT INTERVENTION (PCI-S) N/A 10/23/2012   Procedure: PERCUTANEOUS CORONARY STENT INTERVENTION (PCI-S);  Surgeon: Troy Sine, MD;  Location: [facility] CATH LAB;  Service: Cardiovascular;  Laterality: N/A;  . PERIPHERAL VASCULAR CATHETERIZATION N/A 05/05/2015   Procedure: Glori Luis Cath Insertion;  Surgeon: Algernon Huxley, MD;  Location: [place] CV LAB;  Service: Cardiovascular;  Laterality: N/A;  . PORTA CATH REMOVAL N/A 04/05/2017   Procedure: PORTA CATH REMOVAL;  Surgeon: Algernon Huxley, MD;  Location: [place] CV LAB;  Service: Cardiovascular;  Laterality: N/A;    There were no vitals filed for this visit.   Subjective Assessment - 09/30/19 1608    Subjective Pt reports that she is doing alright today. She continues with her chronic low back pain upon arrival but not more than normal and she doesn't rate currently. She has her bladder botox injections scheduled for tomorrow. No specific questions or concerns at this time.    Pertinent History Pt reports imbalance related to chemo-induced neuropathy which started 4 years ago. Neuropathy occurs in stocking distribution from knees to feet. Symptoms have been unchanged since that time. She has had one fall in the last 6 months. Denies dizziness or vertigo. No  heachaches/migraines. She has a history of chronic back pain and has had a bilateral nerve ablation. Her husband passed away 4 weeks ago after contracting COVID.    Limitations Standing;Walking    How long can you stand comfortably? 5 minutes, (unchanges since last visit) limited due to back pain    How long can you walk comfortably? 5-10 minutes, (unchanges since last visit) limited due to back pain    Patient Stated Goals Strengthen her back muscles to decrease pain and improve her balance     Currently in Pain? Yes   Doesn't rate her pain   Pain Onset --               TREATMENT   Neuromuscular Re-education  NuStep L2 x 5 minutes for warm-up during history (4 minutes unbilled); Sit to stand without UE support and Airex under feet x 10; Airex alteranting step-up/down without UE support x 10; Airex semitandem balance with dynamic reaching for cones, therapist adjusts distance and direction of cones occasionally challenging pt to cross midline x multiple bouts on each side; Airex alternating toe taps on stepping stones without UE support; Rearfoot on Airex, front foot on dynadisc static balance x 30s each with eyes open, repeated x 30s each with eyes closed; repeated x30s each with vertical head turns Rockerboard static balance without UE support in A/P and R/L directions x 30s each direction; Rockerboard weight shifting without UE support in A/P and R/L directions x 30s each direction; Rockerboard static balance without UE support in A/P and R/L directions with horizontal followed by vertical head turns x 30s each; Rockerboard static balance without UE support in A/P and R/L directions with ball tosses with therapist;   Pt educated throughout session about proper posture and technique with exercises. Improved exercise technique, movement at target joints, use of target muscles after min to mod verbal, visual, tactile cues.   Pt demonstrates excellent motivation during session today. Session focused on balance today. She continues to demonstrate difficulty with single leg balance but also difficulty with head turns on rockerboard. She has her botox bladder injections tomorrow but will let therapy know if she is advised to rest on Thursday and not come for her session.Pt encouraged to continue her HEP andtofollow-up as scheduled.Pt will benefit from PT services to address deficits in strength, balance, and mobility in order to return to full function at  home.                             PT Short Term Goals - 07/29/19 1452      PT SHORT TERM GOAL #1   Title Pt will be independent with home exercise program in order to improve balance and leg strength to decrease fall risk and improve function at home    Baseline 07/29/19: Pt reports that she performs "every other day"    Time 6    Period Weeks    Status On-going    Target Date 08/06/19             PT Long Term Goals - 09/16/19 1524      PT LONG TERM GOAL #1   Title Pt will improve BERG to at least 50/56 in order to demonstrate clinically significant improvement in balance.    Baseline 06/24/19: 43/56; 07/29/19: 47/56; 6/22: 47/56 (unchanged)    Time 12    Period Weeks    Status On-going    Target Date 09/16/19  PT LONG TERM GOAL #2   Title Pt will improve ABC by at least 13% in order to demonstrate clinically significant improvement in balance confidence.    Baseline 06/24/19: 35%; 07/29/19: 55.625%    Time 12    Period Weeks    Status Achieved    Target Date 09/16/19      PT LONG TERM GOAL #3   Title Pt will increase self-selected 10MWT by at least 0.13 m/s in order to demonstrate clinically significant improvement in community ambulation.    Baseline 06/24/19: 14.3s = 0.70 m/s; 07/29/19: 11.5s = 0.87 m/s; on 6/22: 1.71m/s    Time 12    Period Weeks    Status Achieved    Target Date 09/16/19      PT LONG TERM GOAL #4   Title Pt will improve ABC to greater than 67% in order to demonstrate clinically significant improvement in balance confidence.    Baseline 07/29/19: 55.625%    Time 12    Period Weeks    Status New    Target Date 09/16/19      PT LONG TERM GOAL #5   Title pt will be able to demonstrate proper lifting technique 3/5 times in order to demonstrate improved balance and decrease risk fo falls.     Baseline 5/5 on 12/16/15    Time 4    Period Weeks    Status Achieved    Target Date 09/11/19      Additional Long Term Goals    Additional Long Term Goals Yes      PT LONG TERM GOAL #6   Title Pt will tolerated >1222ft in 6MWT to improve tolerance to AMB in the community for IADL performance.    Baseline Unable to tolerate more than 5 minutes AMB due to back pain.    Time 8    Period Weeks    Status New    Target Date 11/11/19                 Plan - 09/30/19 1610    Clinical Impression Statement Pt demonstrates excellent motivation during session today. Session focused on balance today. She continues to demonstrate difficulty with single leg balance but also difficulty with head turns on rockerboard. She has her botox bladder injections tomorrow but will let therapy know if she is advised to rest on Thursday and not come for her session. Pt encouraged to continue her HEP and to follow-up as scheduled. Pt will benefit from PT services to address deficits in strength, balance, and mobility in order to return to full function at home.    Personal Factors and Comorbidities Age;Comorbidity 3+;Past/Current Experience;Time since onset of injury/illness/exacerbation    Comorbidities Neuropathy, chronic back pain, MI, OA, CAD, HTN    Examination-Activity Limitations Bend;Locomotion Level;Lift;Stairs;Stand    Examination-Participation Restrictions Community Activity;Laundry;Yard Work    Stability/Clinical Decision Making Unstable/Unpredictable    Rehab Potential Fair    PT Frequency 2x / week    PT Duration 12 weeks    PT Treatment/Interventions ADLs/Self Care Home Management;Aquatic Therapy;Biofeedback;Canalith Repostioning;Cryotherapy;Electrical Stimulation;Iontophoresis 4mg /ml Dexamethasone;Moist Heat;Traction;Ultrasound;DME Instruction;Gait training;Stair training;Functional mobility training;Therapeutic activities;Therapeutic exercise;Balance training;Neuromuscular re-education;Cognitive remediation;Patient/family education;Manual techniques;Passive range of motion;Dry needling;Vestibular;Spinal Manipulations;Joint  Manipulations    PT Next Visit Plan Continue with strength and balance exercises during session.    PT Home Exercise Plan Medbridge Access Code: [email or web address]    Consulted and Agree with Plan of Care Patient           Patient will benefit  from skilled therapeutic intervention in order to improve the following deficits and impairments:  Abnormal gait, Decreased activity tolerance, Decreased balance, Decreased mobility, Decreased strength, Difficulty walking, Impaired sensation, Pain  Visit Diagnosis: Unsteadiness on feet  Muscle weakness (generalized)     Problem List Patient Active Problem List   Diagnosis Date Noted  . Spondylosis without myelopathy or radiculopathy, lumbar region 09/04/2019  . Chronic pain syndrome 09/04/2019  . Chemotherapy-induced neuropathy ([place]) 04/17/2018  . Neuropathy 04/17/2018  . Chemotherapy-induced peripheral neuropathy ([place]) 03/01/2018  . Persistent atrial fibrillation ([place])   . Port-A-Cath in place 03/28/2017  . Bradycardia 01/18/2017  . Obesity (BMI 30-39.9) 01/17/2017  . Pulmonary nodules 12/05/2016  . Compression fracture of L5 vertebra with delayed healing 11/01/2016  . Sacral insufficiency fracture with delayed healing 11/01/2016  . Sacral insufficiency fracture 09/05/2016  . Absence of bladder continence 07/21/2015  . Neuropathy associated with cancer ([place]) 06/02/2015  . Carcinoma of endometrium ([place]) 04/02/2015  . Gastro-esophageal reflux disease without esophagitis 02/15/2015  . Osteopenia 08/17/2014  . Hyperlipidemia 02/13/2014  . Kidney stones 10/27/2013  . CA skin, basal cell 06/30/2013  . Spinal stenosis, lumbar region, with neurogenic claudication 06/30/2013  . Arthritis, degenerative 06/30/2013  . Neuralgia neuritis, sciatic nerve 06/30/2013  . Coronary artery disease 10/25/2012  . Dyslipidemia 10/25/2012  . NSVT (nonsustained ventricular tachycardia) ([place]) 10/25/2012  . Sick sinus syndrome ([place]) 10/25/2012  . Ischemic  cardiomyopathy 10/22/2012  . Old anterior wall myocardial infarction 10/20/2012  . Hypertensive heart disease without CHF 10/20/2012   Lyndel Safe Kyelle Urbas PT, DPT, GCS  Gearlene Godsil 09/30/2019, 5:17 PM  [place] MAIN [facility] SERVICES [address] [place], [place], [address] Phone: [phone]   Fax:  [phone]  Name: Susan [name] MRN: [id number] Date of Birth: 01-03-1935

## 2019-09-30 NOTE — Patient Instructions (Signed)
Medication Instructions:  Your physician recommends that you continue on your current medications as directed. Please refer to the Current Medication list given to you today.  *If you need a refill on your cardiac medications before your next appointment, please call your pharmacy*   Lab Work: [place]   If you have labs (blood work) drawn today and your tests are completely normal, you will receive your results only by: [name] MyChart Message (if you have MyChart) OR . A paper copy in the mail If you have any lab test that is abnormal or we need to change your treatment, we will call you to review the results.   Testing/Procedures: NONE ORDERED  TODAY   Follow-Up: At [facility], you and your health needs are our priority.  As part of our continuing mission to provide you with exceptional heart care, we have created designated Provider Care Teams.  These Care Teams include your primary Cardiologist (physician) and Advanced Practice Providers (APPs -  Physician Assistants and Nurse Practitioners) who all work together to provide you with the care you need, when you need it.  We recommend signing up for the patient portal called "MyChart".  Sign up information is provided on this After Visit Summary.  MyChart is used to connect with patients for Virtual Visits (Telemedicine).  Patients are able to view lab/test results, encounter notes, upcoming appointments, etc.  Non-urgent messages can be sent to your provider as well.   To learn more about what you can do with MyChart, go to [email or web address].    Your next appointment:  CONTACT [facility] [phone] AS NEEDED FOR  ANY CARDIAC RELATED SYMPTOMS   The format for your next appointment:   In Person  Provider:   You may see Will Meredith Leeds, MD or one of the following Advanced Practice Providers on your designated Care Team:    Chanetta Marshall, NP  Tommye Standard, PA-C  Legrand Como "Oda Kilts, [place]    Other  Instructions

## 2019-10-02 ENCOUNTER — Other Ambulatory Visit: Payer: Self-pay

## 2019-10-02 ENCOUNTER — Ambulatory Visit: Payer: Medicare PPO

## 2019-10-02 DIAGNOSIS — M6281 Muscle weakness (generalized): Secondary | ICD-10-CM

## 2019-10-02 DIAGNOSIS — R2681 Unsteadiness on feet: Secondary | ICD-10-CM | POA: Diagnosis not present

## 2019-10-02 NOTE — Therapy (Signed)
[place] MAIN [facility] SERVICES [address] [place], [place], [address] Phone: [phone]   Fax:  [phone]  Physical Therapy Treatment  Patient Details  Name: Susan [name] MRN: [id number] Date of Birth: January 30, 1935 Referring Provider (PT): Dr. Melrose Nakayama   Encounter Date: 10/02/2019   PT End of Session - 10/02/19 1717    Visit Number 25    Number of Visits 32    Date for PT Re-Evaluation 11/11/19    Authorization Type eval 06/24/19, PN: 07/29/19; 09/16/19-11/11/19    PT Start Time 1430    PT Stop Time 1515    PT Time Calculation (min) 45 min    Equipment Utilized During Treatment Gait belt    Activity Tolerance Patient tolerated treatment well;Patient limited by pain    Behavior During Therapy [facility] for tasks assessed/performed           Past Medical History:  Diagnosis Date  . Bradycardia   . Chronic back pain   . Coronary artery disease    a. s/p STEMI in 2014 with DES to LAD with staged PCI/DES placement to LCx  . Echoencephalogram abnormality   . Heart murmur   . History of Holter monitoring   . Hyperlipidemia   . Hypertension   . Myocardial infarction ([place])   . Neuromuscular disorder (HCC)    neuropathy  . NSVT (nonsustained ventricular tachycardia) ([place])   . Personal history of chemotherapy    ENDOMETRIAL CA  . Personal history of radiation therapy    ENDOMETRIAL CA  . Uterine cancer ([place])    serrous    Past Surgical History:  Procedure Laterality Date  . ABDOMINAL HYSTERECTOMY    . BREAST EXCISIONAL BIOPSY Right 1956   NEG  . CARDIOVERSION N/A 07/27/2017   Procedure: CARDIOVERSION;  Surgeon: Skeet Latch, MD;  Location: [place];  Service: Cardiovascular;  Laterality: N/A;  . LEFT HEART CATHETERIZATION WITH CORONARY ANGIOGRAM  10/19/2012   Procedure: LEFT HEART CATHETERIZATION WITH CORONARY ANGIOGRAM;  Surgeon: Troy Sine, MD;  Location: [facility] CATH LAB;  Service: Cardiovascular;;  . PERCUTANEOUS CORONARY  STENT INTERVENTION (PCI-S)  10/19/2012   Procedure: PERCUTANEOUS CORONARY STENT INTERVENTION (PCI-S);  Surgeon: Troy Sine, MD;  Location: [facility] CATH LAB;  Service: Cardiovascular;;  . PERCUTANEOUS CORONARY STENT INTERVENTION (PCI-S) N/A 10/23/2012   Procedure: PERCUTANEOUS CORONARY STENT INTERVENTION (PCI-S);  Surgeon: Troy Sine, MD;  Location: [facility] CATH LAB;  Service: Cardiovascular;  Laterality: N/A;  . PERIPHERAL VASCULAR CATHETERIZATION N/A 05/05/2015   Procedure: Glori Luis Cath Insertion;  Surgeon: Algernon Huxley, MD;  Location: [place] CV LAB;  Service: Cardiovascular;  Laterality: N/A;  . PORTA CATH REMOVAL N/A 04/05/2017   Procedure: PORTA CATH REMOVAL;  Surgeon: Algernon Huxley, MD;  Location: [place] CV LAB;  Service: Cardiovascular;  Laterality: N/A;    There were no vitals filed for this visit.   Subjective Assessment - 10/02/19 1433    Subjective Pt reports that she is doing alright today. She continues with her chronic low back pain (7/10) upon arrival but not more than normal.. She had the bladder botox injection yesterday and will see the doctor next month. She had to have an indwelling catheter overnight and returned for it to be removed today. She was advised she could continue with therapy without restrictions. No specific questions or concerns at this time.    Pertinent History Pt reports imbalance related to chemo-induced neuropathy which started 4 years ago. Neuropathy occurs in stocking  distribution from knees to feet. Symptoms have been unchanged since that time. She has had one fall in the last 6 months. Denies dizziness or vertigo. No heachaches/migraines. She has a history of chronic back pain and has had a bilateral nerve ablation. Her husband passed away 4 weeks ago after contracting COVID.    Limitations Standing;Walking    How long can you stand comfortably? 5 minutes, (unchanges since last visit) limited due to back pain    How long can you walk comfortably? 5-10  minutes, (unchanges since last visit) limited due to back pain    Patient Stated Goals Strengthen her back muscles to decrease pain and improve her balance    Currently in Pain? Yes    Pain Score 5     Pain Location Back    Pain Orientation Lower    Pain Descriptors / Indicators Aching    Pain Type Chronic pain    Pain Onset More than a month ago    Pain Frequency Constant               TREATMENT   Neuromuscular Re-education  NuStep L2 x 5 minutes for warm-up during history (4 minutes unbilled); Sit to stand without UE support and Airex under feet x 10; Airex alternating step-up/down without UE support x 10; Rearfoot on Airex, front foot on dynadisc static balance x 30s each with eyes open, repeated x30s each with vertical head turns Step ups onto 6" without UE support x 10 each Rockerboard static balance without UE support in A/P x 30s each direction; Rockerboard static balance without UE support in A/P with horizontal followed by vertical head turns x 30s each; Airex tandem gait x4 Airex side steps x4 Standing hip abductions with bilateral UE support     Pt educated throughout session about proper posture and technique with exercises. Improved exercise technique, movement at target joints, use of target muscles after min to mod verbal, visual, tactile cues.      Pt demonstrates excellent motivation during session today. Session focused on balance today. Strenous strengthening exercises were held off due to botox bladder injections yesterday. She continues to demonstrate difficulty with single leg balance but also difficulty with head turns on rockerboard. Pt encouraged to continue her HEP and to follow-up as scheduled. Pt will benefit from PT services to address deficits in strength, balance, and mobility in order to return to full function at home.                           PT Short Term Goals - 07/29/19 1452      PT SHORT TERM GOAL #1   Title Pt  will be independent with home exercise program in order to improve balance and leg strength to decrease fall risk and improve function at home    Baseline 07/29/19: Pt reports that she performs "every other day"    Time 6    Period Weeks    Status On-going    Target Date 08/06/19             PT Long Term Goals - 09/16/19 1524      PT LONG TERM GOAL #1   Title Pt will improve BERG to at least 50/56 in order to demonstrate clinically significant improvement in balance.    Baseline 06/24/19: 43/56; 07/29/19: 47/56; 6/22: 47/56 (unchanged)    Time 12    Period Weeks    Status On-going  Target Date 09/16/19      PT LONG TERM GOAL #2   Title Pt will improve ABC by at least 13% in order to demonstrate clinically significant improvement in balance confidence.    Baseline 06/24/19: 35%; 07/29/19: 55.625%    Time 12    Period Weeks    Status Achieved    Target Date 09/16/19      PT LONG TERM GOAL #3   Title Pt will increase self-selected 10MWT by at least 0.13 m/s in order to demonstrate clinically significant improvement in community ambulation.    Baseline 06/24/19: 14.3s = 0.70 m/s; 07/29/19: 11.5s = 0.87 m/s; on 6/22: 1.49m/s    Time 12    Period Weeks    Status Achieved    Target Date 09/16/19      PT LONG TERM GOAL #4   Title Pt will improve ABC to greater than 67% in order to demonstrate clinically significant improvement in balance confidence.    Baseline 07/29/19: 55.625%    Time 12    Period Weeks    Status New    Target Date 09/16/19      PT LONG TERM GOAL #5   Title pt will be able to demonstrate proper lifting technique 3/5 times in order to demonstrate improved balance and decrease risk fo falls.     Baseline 5/5 on 12/16/15    Time 4    Period Weeks    Status Achieved    Target Date 09/11/19      Additional Long Term Goals   Additional Long Term Goals Yes      PT LONG TERM GOAL #6   Title Pt will tolerated >1258ft in 6MWT to improve tolerance to AMB in the community  for IADL performance.    Baseline Unable to tolerate more than 5 minutes AMB due to back pain.    Time 8    Period Weeks    Status New    Target Date 11/11/19                 Plan - 10/02/19 1717    Clinical Impression Statement Pt demonstrates excellent motivation during session today. Session focused on balance today. Strenous strengthening exercises were held off due to botox bladder injections yesterday. She continues to demonstrate difficulty with single leg balance but also difficulty with head turns on rockerboard. Pt encouraged to continue her HEP and to follow-up as scheduled. Pt will benefit from PT services to address deficits in strength, balance, and mobility in order to return to full function at home.    Personal Factors and Comorbidities Age;Comorbidity 3+;Past/Current Experience;Time since onset of injury/illness/exacerbation    Comorbidities Neuropathy, chronic back pain, MI, OA, CAD, HTN    Examination-Activity Limitations Bend;Locomotion Level;Lift;Stairs;Stand    Examination-Participation Restrictions Community Activity;Laundry;Yard Work    Stability/Clinical Decision Making Unstable/Unpredictable    Rehab Potential Fair    PT Frequency 2x / week    PT Duration 12 weeks    PT Treatment/Interventions ADLs/Self Care Home Management;Aquatic Therapy;Biofeedback;Canalith Repostioning;Cryotherapy;Electrical Stimulation;Iontophoresis 4mg /ml Dexamethasone;Moist Heat;Traction;Ultrasound;DME Instruction;Gait training;Stair training;Functional mobility training;Therapeutic activities;Therapeutic exercise;Balance training;Neuromuscular re-education;Cognitive remediation;Patient/family education;Manual techniques;Passive range of motion;Dry needling;Vestibular;Spinal Manipulations;Joint Manipulations    PT Next Visit Plan Continue with strength and balance exercises during session.    PT Home Exercise Plan Medbridge Access Code: [email or web address]    Consulted and Agree with Plan of Care  Patient           Patient will benefit from skilled therapeutic intervention in order  to improve the following deficits and impairments:  Abnormal gait, Decreased activity tolerance, Decreased balance, Decreased mobility, Decreased strength, Difficulty walking, Impaired sensation, Pain  Visit Diagnosis: Unsteadiness on feet  Muscle weakness (generalized)     Problem List Patient Active Problem List   Diagnosis Date Noted  . Spondylosis without myelopathy or radiculopathy, lumbar region 09/04/2019  . Chronic pain syndrome 09/04/2019  . Chemotherapy-induced neuropathy ([place]) 04/17/2018  . Neuropathy 04/17/2018  . Chemotherapy-induced peripheral neuropathy ([place]) 03/01/2018  . Persistent atrial fibrillation ([place])   . Port-A-Cath in place 03/28/2017  . Bradycardia 01/18/2017  . Obesity (BMI 30-39.9) 01/17/2017  . Pulmonary nodules 12/05/2016  . Compression fracture of L5 vertebra with delayed healing 11/01/2016  . Sacral insufficiency fracture with delayed healing 11/01/2016  . Sacral insufficiency fracture 09/05/2016  . Absence of bladder continence 07/21/2015  . Neuropathy associated with cancer ([place]) 06/02/2015  . Carcinoma of endometrium ([place]) 04/02/2015  . Gastro-esophageal reflux disease without esophagitis 02/15/2015  . Osteopenia 08/17/2014  . Hyperlipidemia 02/13/2014  . Kidney stones 10/27/2013  . CA skin, basal cell 06/30/2013  . Spinal stenosis, lumbar region, with neurogenic claudication 06/30/2013  . Arthritis, degenerative 06/30/2013  . Neuralgia neuritis, sciatic nerve 06/30/2013  . Coronary artery disease 10/25/2012  . Dyslipidemia 10/25/2012  . NSVT (nonsustained ventricular tachycardia) ([place]) 10/25/2012  . Sick sinus syndrome ([place]) 10/25/2012  . Ischemic cardiomyopathy 10/22/2012  . Old anterior wall myocardial infarction 10/20/2012  . Hypertensive heart disease without CHF 10/20/2012   Lyndel Safe Tajana Crotteau PT, DPT, GCS  Rodina Pinales 10/02/2019, 5:19  PM  [place] MAIN [facility] SERVICES [address] [place], [place], [address] Phone: [phone]   Fax:  [phone]  Name: SUKARI GRIST MRN: [id number] Date of Birth: 11/10/1934

## 2019-10-07 ENCOUNTER — Other Ambulatory Visit: Payer: Self-pay

## 2019-10-07 ENCOUNTER — Ambulatory Visit: Payer: Medicare PPO

## 2019-10-07 DIAGNOSIS — M6281 Muscle weakness (generalized): Secondary | ICD-10-CM

## 2019-10-07 DIAGNOSIS — R2681 Unsteadiness on feet: Secondary | ICD-10-CM

## 2019-10-07 NOTE — Therapy (Signed)
[place] MAIN [facility] SERVICES [address] [place], [place], [address] Phone: [phone]   Fax:  [phone]  Physical Therapy Treatment  Patient Details  Name: Susan [name] MRN: [id number] Date of Birth: 04-17-1934 Referring Provider (PT): Dr. Melrose Nakayama   Encounter Date: 10/07/2019   PT End of Session - 10/07/19 1520    Visit Number 26    Number of Visits 32    Date for PT Re-Evaluation 11/11/19    Authorization Type eval 06/24/19, PN: 07/29/19; 09/16/19-11/11/19    PT Start Time 1432    PT Stop Time 1516    PT Time Calculation (min) 44 min    Equipment Utilized During Treatment Gait belt    Activity Tolerance Patient tolerated treatment well;Patient limited by pain    Behavior During Therapy [facility] for tasks assessed/performed           Past Medical History:  Diagnosis Date   Bradycardia    Chronic back pain    Coronary artery disease    a. s/p STEMI in 2014 with DES to LAD with staged PCI/DES placement to LCx   Echoencephalogram abnormality    Heart murmur    History of Holter monitoring    Hyperlipidemia    Hypertension    Myocardial infarction ([place])    Neuromuscular disorder (HCC)    neuropathy   NSVT (nonsustained ventricular tachycardia) ([place])    Personal history of chemotherapy    ENDOMETRIAL CA   Personal history of radiation therapy    ENDOMETRIAL CA   Uterine cancer ([place])    serrous    Past Surgical History:  Procedure Laterality Date   ABDOMINAL HYSTERECTOMY     BREAST EXCISIONAL BIOPSY Right 1956   NEG   CARDIOVERSION N/A 07/27/2017   Procedure: CARDIOVERSION;  Surgeon: Skeet Latch, MD;  Location: [place];  Service: Cardiovascular;  Laterality: N/A;   LEFT HEART CATHETERIZATION WITH CORONARY ANGIOGRAM  10/19/2012   Procedure: LEFT HEART CATHETERIZATION WITH CORONARY ANGIOGRAM;  Surgeon: Troy Sine, MD;  Location: [facility] CATH LAB;  Service: Cardiovascular;;   PERCUTANEOUS CORONARY  STENT INTERVENTION (PCI-S)  10/19/2012   Procedure: PERCUTANEOUS CORONARY STENT INTERVENTION (PCI-S);  Surgeon: Troy Sine, MD;  Location: [facility] CATH LAB;  Service: Cardiovascular;;   PERCUTANEOUS CORONARY STENT INTERVENTION (PCI-S) N/A 10/23/2012   Procedure: PERCUTANEOUS CORONARY STENT INTERVENTION (PCI-S);  Surgeon: Troy Sine, MD;  Location: [facility] CATH LAB;  Service: Cardiovascular;  Laterality: N/A;   PERIPHERAL VASCULAR CATHETERIZATION N/A 05/05/2015   Procedure: Glori Luis Cath Insertion;  Surgeon: Algernon Huxley, MD;  Location: [place] CV LAB;  Service: Cardiovascular;  Laterality: N/A;   PORTA CATH REMOVAL N/A 04/05/2017   Procedure: PORTA CATH REMOVAL;  Surgeon: Algernon Huxley, MD;  Location: [place] CV LAB;  Service: Cardiovascular;  Laterality: N/A;    There were no vitals filed for this visit.   Subjective Assessment - 10/07/19 1437    Subjective Pt reports that she is doing good today. She continues with her chronic low back pain (4/10) upon arrival and is feeling better than normal. She reports the botox injection for her incontinence has helped with urine control when standing up and she has gone from using 4-5 pads/day to just 1 pad. No specific questions or concerns at this time.    Pertinent History Pt reports imbalance related to chemo-induced neuropathy which started 4 years ago. Neuropathy occurs in stocking distribution from knees to feet. Symptoms have been unchanged since that  time. She has had one fall in the last 6 months. Denies dizziness or vertigo. No heachaches/migraines. She has a history of chronic back pain and has had a bilateral nerve ablation. Her husband passed away 4 weeks ago after contracting COVID.    Limitations Standing;Walking    How long can you stand comfortably? 5 minutes, (unchanges since last visit) limited due to back pain    How long can you walk comfortably? 5-10 minutes, (unchanges since last visit) limited due to back pain    Patient Stated  Goals Strengthen her back muscles to decrease pain and improve her balance    Currently in Pain? Yes    Pain Score 4     Pain Location Back    Pain Orientation Lower    Pain Descriptors / Indicators Aching    Pain Type Chronic pain    Pain Onset More than a month ago              TREATMENT   Therapeutic Exercises NuStep L2 x 5 minutes for warm-up during history (3 minutes unbilled); Sit to stand without UE support and Airex under feet x 10; Alternating step-up/down on 6 step without UE support x 10 each LE, hands on thigh for support. Eccentric step downs from 6" with 2 fingers for UE support x 10 each, patient cued for slow and controlled landing and keeping the LE straight   Neuromuscular Re-education  Rearfoot on airex and front foot on dynadisc static balance 2x30s eyes open; Airex tandem gait x4 lengths, hands on thighs for support; Airex side steps x4 lengths, hands on thighs for support; Balance on airex FT with reaching L and R and across the body and slam dunks x several attempts.   Pt educated throughout session about proper posture and technique with exercises. Improved exercise technique, movement at target joints, use of target muscles after min to mod verbal, visual, tactile cues.     Pt demonstrates excellent motivation during session today. Session focused on a combination of strength and balance today. New balance exercises introduced as her balance has improved. Eccentric step downs were difficult as she typically goes down stairs sideways so continued eccentric strengthening of quads. Pt encouraged to continue her HEP and to follow-up as scheduled. Pt will benefit from PT services to address deficits in strength, balance, and mobility in order to return to full function at home.          PT Short Term Goals - 07/29/19 1452      PT SHORT TERM GOAL #1   Title Pt will be independent with home exercise program in order to improve balance and leg strength  to decrease fall risk and improve function at home    Baseline 07/29/19: Pt reports that she performs "every other day"    Time 6    Period Weeks    Status On-going    Target Date 08/06/19             PT Long Term Goals - 09/16/19 1524      PT LONG TERM GOAL #1   Title Pt will improve BERG to at least 50/56 in order to demonstrate clinically significant improvement in balance.    Baseline 06/24/19: 43/56; 07/29/19: 47/56; 6/22: 47/56 (unchanged)    Time 12    Period Weeks    Status On-going    Target Date 09/16/19      PT LONG TERM GOAL #2   Title Pt will improve ABC by  at least 13% in order to demonstrate clinically significant improvement in balance confidence.    Baseline 06/24/19: 35%; 07/29/19: 55.625%    Time 12    Period Weeks    Status Achieved    Target Date 09/16/19      PT LONG TERM GOAL #3   Title Pt will increase self-selected 10MWT by at least 0.13 m/s in order to demonstrate clinically significant improvement in community ambulation.    Baseline 06/24/19: 14.3s = 0.70 m/s; 07/29/19: 11.5s = 0.87 m/s; on 6/22: 1.23m/s    Time 12    Period Weeks    Status Achieved    Target Date 09/16/19      PT LONG TERM GOAL #4   Title Pt will improve ABC to greater than 67% in order to demonstrate clinically significant improvement in balance confidence.    Baseline 07/29/19: 55.625%    Time 12    Period Weeks    Status New    Target Date 09/16/19      PT LONG TERM GOAL #5   Title pt will be able to demonstrate proper lifting technique 3/5 times in order to demonstrate improved balance and decrease risk fo falls.     Baseline 5/5 on 12/16/15    Time 4    Period Weeks    Status Achieved    Target Date 09/11/19      Additional Long Term Goals   Additional Long Term Goals Yes      PT LONG TERM GOAL #6   Title Pt will tolerated >125ft in 6MWT to improve tolerance to AMB in the community for IADL performance.    Baseline Unable to tolerate more than 5 minutes AMB due to back  pain.    Time 8    Period Weeks    Status New    Target Date 11/11/19                 Plan - 10/07/19 1521    Clinical Impression Statement Pt demonstrates excellent motivation during session today. Session focused on a combination of strength and balance today. New balance exercises introduced as her balance has improved. Eccentric step downs were difficult as she typically goes down stairs sideways so continued eccentric strengthening of quads. Pt encouraged to continue her HEP and to follow-up as scheduled. Pt will benefit from PT services to address deficits in strength, balance, and mobility in order to return to full function at home.    Personal Factors and Comorbidities Age;Comorbidity 3+;Past/Current Experience;Time since onset of injury/illness/exacerbation    Comorbidities Neuropathy, chronic back pain, MI, OA, CAD, HTN    Examination-Activity Limitations Bend;Locomotion Level;Lift;Stairs;Stand    Examination-Participation Restrictions Community Activity;Laundry;Yard Work    Stability/Clinical Decision Making Unstable/Unpredictable    Rehab Potential Fair    PT Frequency 2x / week    PT Duration 12 weeks    PT Treatment/Interventions ADLs/Self Care Home Management;Aquatic Therapy;Biofeedback;Canalith Repostioning;Cryotherapy;Electrical Stimulation;Iontophoresis 4mg /ml Dexamethasone;Moist Heat;Traction;Ultrasound;DME Instruction;Gait training;Stair training;Functional mobility training;Therapeutic activities;Therapeutic exercise;Balance training;Neuromuscular re-education;Cognitive remediation;Patient/family education;Manual techniques;Passive range of motion;Dry needling;Vestibular;Spinal Manipulations;Joint Manipulations    PT Next Visit Plan Continue with strength and balance exercises during session. Especially eccentric quad control.    PT Home Exercise Plan Medbridge Access Code: [email or web address]    Consulted and Agree with Plan of Care Patient           Patient will benefit  from skilled therapeutic intervention in order to improve the following deficits and impairments:  Abnormal gait, Decreased activity tolerance, Decreased  balance, Decreased mobility, Decreased strength, Difficulty walking, Impaired sensation, Pain  Visit Diagnosis: Unsteadiness on feet  Muscle weakness (generalized)     Problem List Patient Active Problem List   Diagnosis Date Noted   Spondylosis without myelopathy or radiculopathy, lumbar region 09/04/2019   Chronic pain syndrome 09/04/2019   Chemotherapy-induced neuropathy ([place]) 04/17/2018   Neuropathy 04/17/2018   Chemotherapy-induced peripheral neuropathy ([place]) 03/01/2018   Persistent atrial fibrillation ([place])    Port-A-Cath in place 03/28/2017   Bradycardia 01/18/2017   Obesity (BMI 30-39.9) 01/17/2017   Pulmonary nodules 12/05/2016   Compression fracture of L5 vertebra with delayed healing 11/01/2016   Sacral insufficiency fracture with delayed healing 11/01/2016   Sacral insufficiency fracture 09/05/2016   Absence of bladder continence 07/21/2015   Neuropathy associated with cancer ([place]) 06/02/2015   Carcinoma of endometrium ([place]) 04/02/2015   Gastro-esophageal reflux disease without esophagitis 02/15/2015   Osteopenia 08/17/2014   Hyperlipidemia 02/13/2014   Kidney stones 10/27/2013   CA skin, basal cell 06/30/2013   Spinal stenosis, lumbar region, with neurogenic claudication 06/30/2013   Arthritis, degenerative 06/30/2013   Neuralgia neuritis, sciatic nerve 06/30/2013   Coronary artery disease 10/25/2012   Dyslipidemia 10/25/2012   NSVT (nonsustained ventricular tachycardia) ([place]) 10/25/2012   Sick sinus syndrome ([place]) 10/25/2012   Ischemic cardiomyopathy 10/22/2012   Old anterior wall myocardial infarction 10/20/2012   Hypertensive heart disease without CHF 10/20/2012    This entire session was performed under direct supervision and direction of a licensed therapist/therapist  assistant . I have personally read, edited and approve of the note as written.   Noemi Chapel, SPT Phillips Grout PT, DPT, GCS  Huprich,Jason 10/08/2019, 9:01 AM  [place] MAIN [facility] SERVICES [address] [place], [place], [address] Phone: [phone]   Fax:  [phone]  Name: MICHEL ESKELSON MRN: [id number] Date of Birth: May 05, 1934

## 2019-10-09 ENCOUNTER — Other Ambulatory Visit: Payer: Self-pay

## 2019-10-09 ENCOUNTER — Ambulatory Visit: Payer: Medicare PPO

## 2019-10-09 DIAGNOSIS — M6281 Muscle weakness (generalized): Secondary | ICD-10-CM

## 2019-10-09 DIAGNOSIS — R2681 Unsteadiness on feet: Secondary | ICD-10-CM

## 2019-10-09 NOTE — Therapy (Signed)
[place] MAIN [facility] SERVICES [address] [place], [place], [address] Phone: [phone]   Fax:  [phone]  Physical Therapy Treatment  Patient Details  Name: Susan [name] MRN: [id number] Date of Birth: [birth year] Referring Provider (PT): Dr. Melrose Nakayama   Encounter Date: 10/09/2019   PT End of Session - 10/09/19 1556    Visit Number 27    Number of Visits 32    Date for PT Re-Evaluation 11/11/19    Authorization Type eval 06/24/19, PN: 07/29/19; 09/16/19-11/11/19    PT Start Time 1602    PT Stop Time 1645    PT Time Calculation (min) 43 min    Equipment Utilized During Treatment Gait belt    Activity Tolerance Patient tolerated treatment well;Patient limited by pain    Behavior During Therapy [facility] for tasks assessed/performed           Past Medical History:  Diagnosis Date   Bradycardia    Chronic back pain    Coronary artery disease    a. s/p STEMI in 2014 with DES to LAD with staged PCI/DES placement to LCx   Echoencephalogram abnormality    Heart murmur    History of Holter monitoring    Hyperlipidemia    Hypertension    Myocardial infarction ([place])    Neuromuscular disorder (HCC)    neuropathy   NSVT (nonsustained ventricular tachycardia) ([place])    Personal history of chemotherapy    ENDOMETRIAL CA   Personal history of radiation therapy    ENDOMETRIAL CA   Uterine cancer ([place])    serrous    Past Surgical History:  Procedure Laterality Date   ABDOMINAL HYSTERECTOMY     BREAST EXCISIONAL BIOPSY Right 1956   NEG   CARDIOVERSION N/A 07/27/2017   Procedure: CARDIOVERSION;  Surgeon: Skeet Latch, MD;  Location: [place];  Service: Cardiovascular;  Laterality: N/A;   LEFT HEART CATHETERIZATION WITH CORONARY ANGIOGRAM  10/19/2012   Procedure: LEFT HEART CATHETERIZATION WITH CORONARY ANGIOGRAM;  Surgeon: Troy Sine, MD;  Location[facility] CATH LAB;  Service: Cardiovascular;;   PERCUTANEOUS CORONARY  STENT INTERVENTION (PCI-S)  10/19/2012   Procedure: PERCUTANEOUS CORONARY STENT INTERVENTION (PCI-S);  Surgeon: Troy Sine, MD;  Location: [facility] CATH LAB;  Service: Cardiovascular;;   PERCUTANEOUS CORONARY STENT INTERVENTION (PCI-S) N/A 10/23/2012   Procedure: PERCUTANEOUS CORONARY STENT INTERVENTION (PCI-S);  Surgeon: Troy Sine, MD;  Location: [facility] CATH LAB;  Service: Cardiovascular;  Laterality: N/A;   PERIPHERAL VASCULAR CATHETERIZATION N/A 05/05/2015   Procedure: Glori Luis Cath Insertion;  Surgeon: Algernon Huxley, MD;  Location: [place] CV LAB;  Service: Cardiovascular;  Laterality: N/A;   PORTA CATH REMOVAL N/A 04/05/2017   Procedure: PORTA CATH REMOVAL;  Surgeon: Algernon Huxley, MD;  Location: [place] CV LAB;  Service: Cardiovascular;  Laterality: N/A;    There were no vitals filed for this visit.   Subjective Assessment - 10/09/19 1556    Subjective Pt reports that she is doing okay today. She continues with her chronic low back pain (3/10) upon arrival after taking 2 pills before she came. She reports the botox injection for her incontinence is still going well. No specific questions or concerns at this time.    Pertinent History Pt reports imbalance related to chemo-induced neuropathy which started 4 years ago. Neuropathy occurs in stocking distribution from knees to feet. Symptoms have been unchanged since that time. She has had one fall in the last 6 months. Denies dizziness or vertigo.  No heachaches/migraines. She has a history of chronic back pain and has had a bilateral nerve ablation. Her husband passed away 4 weeks ago after contracting COVID.    Limitations Standing;Walking    How long can you stand comfortably? 5 minutes, (unchanges since last visit) limited due to back pain    How long can you walk comfortably? 5-10 minutes, (unchanges since last visit) limited due to back pain    Patient Stated Goals Strengthen her back muscles to decrease pain and improve her balance     Currently in Pain? Yes    Pain Score 3     Pain Location Back    Pain Orientation Lower    Pain Descriptors / Indicators Aching    Pain Type Chronic pain    Pain Onset More than a month ago    Pain Frequency Constant              TREATMENT   Therapeutic Exercises NuStep L2 x 5 minutes for warm-up during history (3 minutes unbilled); Sit to stand without UE support and Airex under feet 2 x 10, cued to stand up straight Alternating step-up/down on 6 step without UE support x 10 each LE, hands on thigh for support.    Neuromuscular Re-education  Rearfoot on airex and front foot on dynadisc static balance 2x30s eyes open, patient cued for good posture Ladder steps lateral, forward in and outs, and lateral in and outs x 2 lengths each, cued for proper stepping; Balance on airex FT and semitandem with reaching L and R across the body and slam dunks x multiple bouts, verbal and tactile cues used for no UE support; Lateral step ups on airex foam pad, 6" step, and step down onto airex pad x 10 each, cued for taking bigger steps.    Pt educated throughout session about proper posture and technique with exercises. Improved exercise technique, movement at target joints, use of target muscles after min to mod verbal, visual, tactile cues.     Pt demonstrates excellent motivation during session today. Session focused on a combination of strength and balance today. Ladder steps challenge coordination and balance were introduced as her balance has improved. Pt encouraged to follow-up as scheduled. She will benefit from PT services to address deficits in dizziness, balance, and mobility in order to return to full function at home.            PT Short Term Goals - 07/29/19 1452      PT SHORT TERM GOAL #1   Title Pt will be independent with home exercise program in order to improve balance and leg strength to decrease fall risk and improve function at home    Baseline 07/29/19: Pt reports  that she performs "every other day"    Time 6    Period Weeks    Status On-going    Target Date 08/06/19             PT Long Term Goals - 09/16/19 1524      PT LONG TERM GOAL #1   Title Pt will improve BERG to at least 50/56 in order to demonstrate clinically significant improvement in balance.    Baseline 06/24/19: 43/56; 07/29/19: 47/56; 6/22: 47/56 (unchanged)    Time 12    Period Weeks    Status On-going    Target Date 09/16/19      PT LONG TERM GOAL #2   Title Pt will improve ABC by at least 13% in order to  demonstrate clinically significant improvement in balance confidence.    Baseline 06/24/19: 35%; 07/29/19: 55.625%    Time 12    Period Weeks    Status Achieved    Target Date 09/16/19      PT LONG TERM GOAL #3   Title Pt will increase self-selected 10MWT by at least 0.13 m/s in order to demonstrate clinically significant improvement in community ambulation.    Baseline 06/24/19: 14.3s = 0.70 m/s; 07/29/19: 11.5s = 0.87 m/s; on 6/22: 1.87m/s    Time 12    Period Weeks    Status Achieved    Target Date 09/16/19      PT LONG TERM GOAL #4   Title Pt will improve ABC to greater than 67% in order to demonstrate clinically significant improvement in balance confidence.    Baseline 07/29/19: 55.625%    Time 12    Period Weeks    Status New    Target Date 09/16/19      PT LONG TERM GOAL #5   Title pt will be able to demonstrate proper lifting technique 3/5 times in order to demonstrate improved balance and decrease risk fo falls.     Baseline 5/5 on 12/16/15    Time 4    Period Weeks    Status Achieved    Target Date 09/11/19      Additional Long Term Goals   Additional Long Term Goals Yes      PT LONG TERM GOAL #6   Title Pt will tolerated >1246ft in 6MWT to improve tolerance to AMB in the community for IADL performance.    Baseline Unable to tolerate more than 5 minutes AMB due to back pain.    Time 8    Period Weeks    Status New    Target Date 11/11/19                  Plan - 10/09/19 1556    Clinical Impression Statement Pt demonstrates excellent motivation during session today. Session focused on a combination of strength and balance today. Ladder steps challenge coordination and balance were introduced as her balance has improved. Pt encouraged to follow-up as scheduled. She will benefit from PT services to address deficits in dizziness, balance, and mobility in order to return to full function at home.    Personal Factors and Comorbidities Age;Comorbidity 3+;Past/Current Experience;Time since onset of injury/illness/exacerbation    Comorbidities Neuropathy, chronic back pain, MI, OA, CAD, HTN    Examination-Activity Limitations Bend;Locomotion Level;Lift;Stairs;Stand    Examination-Participation Restrictions Community Activity;Laundry;Yard Work    Stability/Clinical Decision Making Unstable/Unpredictable    Rehab Potential Fair    PT Frequency 2x / week    PT Duration 12 weeks    PT Treatment/Interventions ADLs/Self Care Home Management;Aquatic Therapy;Biofeedback;Canalith Repostioning;Cryotherapy;Electrical Stimulation;Iontophoresis 4mg /ml Dexamethasone;Moist Heat;Traction;Ultrasound;DME Instruction;Gait training;Stair training;Functional mobility training;Therapeutic activities;Therapeutic exercise;Balance training;Neuromuscular re-education;Cognitive remediation;Patient/family education;Manual techniques;Passive range of motion;Dry needling;Vestibular;Spinal Manipulations;Joint Manipulations    PT Next Visit Plan Continue with strength and balance exercises during session. Especially eccentric quad control.    PT Home Exercise Plan Medbridge Access Code: [email or web address]    Consulted and Agree with Plan of Care Patient           Patient will benefit from skilled therapeutic intervention in order to improve the following deficits and impairments:  Abnormal gait, Decreased activity tolerance, Decreased balance, Decreased mobility, Decreased  strength, Difficulty walking, Impaired sensation, Pain  Visit Diagnosis: Unsteadiness on feet  Muscle weakness (generalized)     Problem  List Patient Active Problem List   Diagnosis Date Noted   Spondylosis without myelopathy or radiculopathy, lumbar region 09/04/2019   Chronic pain syndrome 09/04/2019   Chemotherapy-induced neuropathy ([place]) 04/17/2018   Neuropathy 04/17/2018   Chemotherapy-induced peripheral neuropathy ([place]) 03/01/2018   Persistent atrial fibrillation ([place])    Port-A-Cath in place 03/28/2017   Bradycardia 01/18/2017   Obesity (BMI 30-39.9) 01/17/2017   Pulmonary nodules 12/05/2016   Compression fracture of L5 vertebra with delayed healing 11/01/2016   Sacral insufficiency fracture with delayed healing 11/01/2016   Sacral insufficiency fracture 09/05/2016   Absence of bladder continence 07/21/2015   Neuropathy associated with cancer ([place]) 06/02/2015   Carcinoma of endometrium ([place]) 04/02/2015   Gastro-esophageal reflux disease without esophagitis 02/15/2015   Osteopenia 08/17/2014   Hyperlipidemia 02/13/2014   Kidney stones 10/27/2013   CA skin, basal cell 06/30/2013   Spinal stenosis, lumbar region, with neurogenic claudication 06/30/2013   Arthritis, degenerative 06/30/2013   Neuralgia neuritis, sciatic nerve 06/30/2013   Coronary artery disease 10/25/2012   Dyslipidemia 10/25/2012   NSVT (nonsustained ventricular tachycardia) ([place]) 10/25/2012   Sick sinus syndrome ([place]) 10/25/2012   Ischemic cardiomyopathy 10/22/2012   Old anterior wall myocardial infarction 10/20/2012   Hypertensive heart disease without CHF 10/20/2012    This entire session was performed under direct supervision and direction of a licensed therapist/therapist assistant . I have personally read, edited and approve of the note as written.   Noemi Chapel, SPT Phillips Grout PT, DPT, GCS  Huprich,Jason 10/10/2019, 11:58 AM  [place] MAIN [facility] SERVICES [address] [place], [place], [address] Phone: [phone]   Fax:  [phone]  Name: Susan [name] MRN: [id number] Date of Birth: 1934/04/09

## 2019-10-14 ENCOUNTER — Other Ambulatory Visit: Payer: Self-pay

## 2019-10-14 ENCOUNTER — Ambulatory Visit: Payer: Medicare PPO

## 2019-10-14 DIAGNOSIS — R2681 Unsteadiness on feet: Secondary | ICD-10-CM | POA: Diagnosis not present

## 2019-10-14 DIAGNOSIS — M6281 Muscle weakness (generalized): Secondary | ICD-10-CM

## 2019-10-14 NOTE — Therapy (Signed)
[place] MAIN [facility] SERVICES [address] [place], [place], [address] Phone: [phone]   Fax:  [phone]  Physical Therapy Treatment  Patient Details  Name: Susan [name] MRN: [id number] Date of Birth: 22-Jul-1934 Referring Provider (PT): Dr. Melrose Nakayama   Encounter Date: 10/14/2019   PT End of Session - 10/14/19 1357    Visit Number 28    Number of Visits 32    Date for PT Re-Evaluation 11/11/19    Authorization Type eval 06/24/19, PN: 07/29/19; 09/16/19-11/11/19    PT Start Time 1347    PT Stop Time 1430    PT Time Calculation (min) 43 min    Equipment Utilized During Treatment Gait belt    Activity Tolerance Patient tolerated treatment well;Patient limited by pain    Behavior During Therapy [facility] for tasks assessed/performed           Past Medical History:  Diagnosis Date   Bradycardia    Chronic back pain    Coronary artery disease    a. s/p STEMI in 2014 with DES to LAD with staged PCI/DES placement to LCx   Echoencephalogram abnormality    Heart murmur    History of Holter monitoring    Hyperlipidemia    Hypertension    Myocardial infarction ([place])    Neuromuscular disorder (HCC)    neuropathy   NSVT (nonsustained ventricular tachycardia) ([place])    Personal history of chemotherapy    ENDOMETRIAL CA   Personal history of radiation therapy    ENDOMETRIAL CA   Uterine cancer ([place])    serrous    Past Surgical History:  Procedure Laterality Date   ABDOMINAL HYSTERECTOMY     BREAST EXCISIONAL BIOPSY Right 1956   NEG   CARDIOVERSION N/A 07/27/2017   Procedure: CARDIOVERSION;  Surgeon: Skeet Latch, MD;  Location: [place];  Service: Cardiovascular;  Laterality: N/A;   LEFT HEART CATHETERIZATION WITH CORONARY ANGIOGRAM  10/19/2012   Procedure: LEFT HEART CATHETERIZATION WITH CORONARY ANGIOGRAM;  Surgeon: Troy Sine, MD;  Location: [facility] CATH LAB;  Service: Cardiovascular;;   PERCUTANEOUS CORONARY  STENT INTERVENTION (PCI-S)  10/19/2012   Procedure: PERCUTANEOUS CORONARY STENT INTERVENTION (PCI-S);  Surgeon: Troy Sine, MD;  Location: [facility] CATH LAB;  Service: Cardiovascular;;   PERCUTANEOUS CORONARY STENT INTERVENTION (PCI-S) N/A 10/23/2012   Procedure: PERCUTANEOUS CORONARY STENT INTERVENTION (PCI-S);  Surgeon: Troy Sine, MD;  Location: [facility] CATH LAB;  Service: Cardiovascular;  Laterality: N/A;   PERIPHERAL VASCULAR CATHETERIZATION N/A 05/05/2015   Procedure: Glori Luis Cath Insertion;  Surgeon: Algernon Huxley, MD;  Location: [place] CV LAB;  Service: Cardiovascular;  Laterality: N/A;   PORTA CATH REMOVAL N/A 04/05/2017   Procedure: PORTA CATH REMOVAL;  Surgeon: Algernon Huxley, MD;  Location: [place] CV LAB;  Service: Cardiovascular;  Laterality: N/A;    There were no vitals filed for this visit.   Subjective Assessment - 10/14/19 1352    Subjective Pt reports that she is doing okay today. She reports having a cramp at 2 am Friday morning in thigh, which was sore for the weekend. While the soreness in thigh has gone away, she has pain in her R knee, 5/10. Continues with her chronic low back pain (3/10) upon arrival. She sees her pain management doctor next Wednesday. She reports the botox injection for her incontinence is still going well. No specific questions or concerns at this time.    Pertinent History Pt reports imbalance related to chemo-induced neuropathy which  started 4 years ago. Neuropathy occurs in stocking distribution from knees to feet. Symptoms have been unchanged since that time. She has had one fall in the last 6 months. Denies dizziness or vertigo. No heachaches/migraines. She has a history of chronic back pain and has had a bilateral nerve ablation. Her husband passed away 4 weeks ago after contracting COVID.    Limitations Standing;Walking    How long can you stand comfortably? 5 minutes, (unchanges since last visit) limited due to back pain    How long can you walk  comfortably? 5-10 minutes, (unchanges since last visit) limited due to back pain    Patient Stated Goals Strengthen her back muscles to decrease pain and improve her balance    Currently in Pain? Yes    Pain Score 5     Pain Location Knee    Pain Orientation Right    Pain Descriptors / Indicators Aching    Pain Onset More than a month ago              TREATMENT    Therapeutic Exercises Octane L2 x 5 minutes for warm-up during history (3 minutes unbilled); Sit to stand without UE support and Airex under feet x 10; Sit to stand without UE support and Airex under feet 2kg overhead ball press x 10; Alternating step-up/down on 6 step without UE support x 10 each LE, hands on thigh for support.     Neuromuscular Re-education  Ladder steps lateral, forward in and outs, side stepping, and lateral in and outs x 2 lengths each, cued for proper stepping and posture; Airex with balloon tosses feet together x multiple bouts Standing on airex with alternating taps on stepping stones x 10 each leg   Pt educated throughout session about proper posture and technique with exercises. Improved exercise technique, movement at target joints, use of target muscles after min to mod verbal, visual, tactile cues.      Pt demonstrates excellent motivation during session today. Session focused on a combination of strength and balance today.  Leg press machine deferred to next session due to knee pain and worked on functional strength instead. Balance exercises challenged coordination and reaching outside of base of support. Pt encouraged to follow-up as scheduled. She will benefit from PT services to address deficits in dizziness, balance, and mobility in order to return to full function at home.                PT Short Term Goals - 07/29/19 1452      PT SHORT TERM GOAL #1   Title Pt will be independent with home exercise program in order to improve balance and leg strength to decrease fall  risk and improve function at home    Baseline 07/29/19: Pt reports that she performs "every other day"    Time 6    Period Weeks    Status On-going    Target Date 08/06/19             PT Long Term Goals - 09/16/19 1524      PT LONG TERM GOAL #1   Title Pt will improve BERG to at least 50/56 in order to demonstrate clinically significant improvement in balance.    Baseline 06/24/19: 43/56; 07/29/19: 47/56; 6/22: 47/56 (unchanged)    Time 12    Period Weeks    Status On-going    Target Date 09/16/19      PT LONG TERM GOAL #2   Title Pt will  improve ABC by at least 13% in order to demonstrate clinically significant improvement in balance confidence.    Baseline 06/24/19: 35%; 07/29/19: 55.625%    Time 12    Period Weeks    Status Achieved    Target Date 09/16/19      PT LONG TERM GOAL #3   Title Pt will increase self-selected 10MWT by at least 0.13 m/s in order to demonstrate clinically significant improvement in community ambulation.    Baseline 06/24/19: 14.3s = 0.70 m/s; 07/29/19: 11.5s = 0.87 m/s; on 6/22: 1.48m/s    Time 12    Period Weeks    Status Achieved    Target Date 09/16/19      PT LONG TERM GOAL #4   Title Pt will improve ABC to greater than 67% in order to demonstrate clinically significant improvement in balance confidence.    Baseline 07/29/19: 55.625%    Time 12    Period Weeks    Status New    Target Date 09/16/19      PT LONG TERM GOAL #5   Title pt will be able to demonstrate proper lifting technique 3/5 times in order to demonstrate improved balance and decrease risk fo falls.     Baseline 5/5 on 12/16/15    Time 4    Period Weeks    Status Achieved    Target Date 09/11/19      Additional Long Term Goals   Additional Long Term Goals Yes      PT LONG TERM GOAL #6   Title Pt will tolerated >1250ft in 6MWT to improve tolerance to AMB in the community for IADL performance.    Baseline Unable to tolerate more than 5 minutes AMB due to back pain.    Time 8     Period Weeks    Status New    Target Date 11/11/19                 Plan - 10/14/19 1551    Clinical Impression Statement Pt demonstrates excellent motivation during session today. Session focused on a combination of strength and balance today.  Leg press machine deferred to next session due to knee pain and worked on functional strength instead. Balance exercises challenged coordination and reaching outside of base of support. Pt encouraged to follow-up as scheduled. She will benefit from PT services to address deficits in dizziness, balance, and mobility in order to return to full function at home.    Personal Factors and Comorbidities Age;Comorbidity 3+;Past/Current Experience;Time since onset of injury/illness/exacerbation    Comorbidities Neuropathy, chronic back pain, MI, OA, CAD, HTN    Examination-Activity Limitations Bend;Locomotion Level;Lift;Stairs;Stand    Examination-Participation Restrictions Community Activity;Laundry;Yard Work    Stability/Clinical Decision Making Unstable/Unpredictable    Rehab Potential Fair    PT Frequency 2x / week    PT Duration 12 weeks    PT Treatment/Interventions ADLs/Self Care Home Management;Aquatic Therapy;Biofeedback;Canalith Repostioning;Cryotherapy;Electrical Stimulation;Iontophoresis 4mg /ml Dexamethasone;Moist Heat;Traction;Ultrasound;DME Instruction;Gait training;Stair training;Functional mobility training;Therapeutic activities;Therapeutic exercise;Balance training;Neuromuscular re-education;Cognitive remediation;Patient/family education;Manual techniques;Passive range of motion;Dry needling;Vestibular;Spinal Manipulations;Joint Manipulations    PT Next Visit Plan Continue with strength and balance exercises during session. Especially eccentric quad control.    PT Home Exercise Plan Medbridge Access Code: [email or web address]    Consulted and Agree with Plan of Care Patient           Patient will benefit from skilled therapeutic intervention in  order to improve the following deficits and impairments:  Abnormal gait, Decreased activity tolerance, Decreased  balance, Decreased mobility, Decreased strength, Difficulty walking, Impaired sensation, Pain  Visit Diagnosis: Unsteadiness on feet  Muscle weakness (generalized)     Problem List Patient Active Problem List   Diagnosis Date Noted   Spondylosis without myelopathy or radiculopathy, lumbar region 09/04/2019   Chronic pain syndrome 09/04/2019   Chemotherapy-induced neuropathy ([place]) 04/17/2018   Neuropathy 04/17/2018   Chemotherapy-induced peripheral neuropathy ([place]) 03/01/2018   Persistent atrial fibrillation ([place])    Port-A-Cath in place 03/28/2017   Bradycardia 01/18/2017   Obesity (BMI 30-39.9) 01/17/2017   Pulmonary nodules 12/05/2016   Compression fracture of L5 vertebra with delayed healing 11/01/2016   Sacral insufficiency fracture with delayed healing 11/01/2016   Sacral insufficiency fracture 09/05/2016   Absence of bladder continence 07/21/2015   Neuropathy associated with cancer ([place]) 06/02/2015   Carcinoma of endometrium ([place]) 04/02/2015   Gastro-esophageal reflux disease without esophagitis 02/15/2015   Osteopenia 08/17/2014   Hyperlipidemia 02/13/2014   Kidney stones 10/27/2013   CA skin, basal cell 06/30/2013   Spinal stenosis, lumbar region, with neurogenic claudication 06/30/2013   Arthritis, degenerative 06/30/2013   Neuralgia neuritis, sciatic nerve 06/30/2013   Coronary artery disease 10/25/2012   Dyslipidemia 10/25/2012   NSVT (nonsustained ventricular tachycardia) ([place]) 10/25/2012   Sick sinus syndrome ([place]) 10/25/2012   Ischemic cardiomyopathy 10/22/2012   Old anterior wall myocardial infarction 10/20/2012   Hypertensive heart disease without CHF 10/20/2012   This entire session was performed under direct supervision and direction of a licensed therapist/therapist assistant . I have personally read, edited and  approve of the note as written.   Noemi Chapel, SPT Phillips Grout PT, DPT, GCS  Huprich,Jason 10/14/2019, 5:35 PM  [place] MAIN [facility] SERVICES [address] [place], [place], [address] Phone: [phone]   Fax:  [phone]  Name: Susan [name] MRN: [id number] Date of Birth: [birth year]

## 2019-10-16 ENCOUNTER — Ambulatory Visit: Payer: Medicare PPO

## 2019-10-16 ENCOUNTER — Other Ambulatory Visit: Payer: Self-pay

## 2019-10-16 DIAGNOSIS — M6281 Muscle weakness (generalized): Secondary | ICD-10-CM

## 2019-10-16 DIAGNOSIS — R2681 Unsteadiness on feet: Secondary | ICD-10-CM

## 2019-10-16 NOTE — Therapy (Signed)
[place] MAIN [facility] SERVICES [address] [place], [place], [address] Phone: [phone]   Fax:  [phone]  Physical Therapy Treatment  Patient Details  Name: Susan [name] MRN: [id number] Date of Birth: [birth year] Referring Provider (PT): Dr. Melrose Nakayama   Encounter Date: 10/16/2019   PT End of Session - 10/16/19 1801    Visit Number 29    Number of Visits 32    Date for PT Re-Evaluation 11/11/19    Authorization Type eval 06/24/19, PN: 07/29/19; 09/16/19-11/11/19    PT Start Time 1602    PT Stop Time 1645    PT Time Calculation (min) 43 min    Equipment Utilized During Treatment Gait belt    Activity Tolerance Patient tolerated treatment well;Patient limited by pain    Behavior During Therapy [facility] for tasks assessed/performed           Past Medical History:  Diagnosis Date  . Bradycardia   . Chronic back pain   . Coronary artery disease    a. s/p STEMI in 2014 with DES to LAD with staged PCI/DES placement to LCx  . Echoencephalogram abnormality   . Heart murmur   . History of Holter monitoring   . Hyperlipidemia   . Hypertension   . Myocardial infarction ([place])   . Neuromuscular disorder (HCC)    neuropathy  . NSVT (nonsustained ventricular tachycardia) ([place])   . Personal history of chemotherapy    ENDOMETRIAL CA  . Personal history of radiation therapy    ENDOMETRIAL CA  . Uterine cancer ([place])    serrous    Past Surgical History:  Procedure Laterality Date  . ABDOMINAL HYSTERECTOMY    . BREAST EXCISIONAL BIOPSY Right 1956   NEG  . CARDIOVERSION N/A 07/27/2017   Procedure: CARDIOVERSION;  Surgeon: Skeet Latch, MD;  Location: [place];  Service: Cardiovascular;  Laterality: N/A;  . LEFT HEART CATHETERIZATION WITH CORONARY ANGIOGRAM  10/19/2012   Procedure: LEFT HEART CATHETERIZATION WITH CORONARY ANGIOGRAM;  Surgeon: Troy Sine, MD;  Location: [facility] CATH LAB;  Service: Cardiovascular;;  . PERCUTANEOUS CORONARY  STENT INTERVENTION (PCI-S)  10/19/2012   Procedure: PERCUTANEOUS CORONARY STENT INTERVENTION (PCI-S);  Surgeon: Troy Sine, MD;  Location: [facility] CATH LAB;  Service: Cardiovascular;;  . PERCUTANEOUS CORONARY STENT INTERVENTION (PCI-S) N/A 10/23/2012   Procedure: PERCUTANEOUS CORONARY STENT INTERVENTION (PCI-S);  Surgeon: Troy Sine, MD;  Location: [facility] CATH LAB;  Service: Cardiovascular;  Laterality: N/A;  . PERIPHERAL VASCULAR CATHETERIZATION N/A 05/05/2015   Procedure: Glori Luis Cath Insertion;  Surgeon: Algernon Huxley, MD;  Location: [place] CV LAB;  Service: Cardiovascular;  Laterality: N/A;  . PORTA CATH REMOVAL N/A 04/05/2017   Procedure: PORTA CATH REMOVAL;  Surgeon: Algernon Huxley, MD;  Location: [place] CV LAB;  Service: Cardiovascular;  Laterality: N/A;    There were no vitals filed for this visit.   Subjective Assessment - 10/16/19 1606    Subjective Pt reports that she is doing okay today. She went to the neurologist today and was given medication for her neuropathy. Patient took 2 pain pills before she came so the back wouldn't bother her during the session. She reports the botox injection for her incontinence is still going well. No specific questions or concerns at this time.    Pertinent History Pt reports imbalance related to chemo-induced neuropathy which started 4 years ago. Neuropathy occurs in stocking distribution from knees to feet. Symptoms have been unchanged since that time. She has  had one fall in the last 6 months. Denies dizziness or vertigo. No heachaches/migraines. She has a history of chronic back pain and has had a bilateral nerve ablation. Her husband passed away 4 weeks ago after contracting COVID.    Limitations Standing;Walking    How long can you stand comfortably? 5 minutes, (unchanges since last visit) limited due to back pain    How long can you walk comfortably? 5-10 minutes, (unchanges since last visit) limited due to back pain    Patient Stated Goals  Strengthen her back muscles to decrease pain and improve her balance    Currently in Pain? No/denies    Pain Onset --              TREATMENT     Therapeutic Exercises Octane L4, seat height 8, x 5 minutes for warm-up during history (1 minutes unbilled); Precor BLE leg press 100# 2 x 20; Precor BLE heel raises 75# 2 x 20 Sit to stand without UE support and Airex under feet 2kg overhead ball press x 10; Hip exercises using 4# AW: Side steps with no UE support; Walking marches with no UE support x 2 lengths Hip extensions x 15 each, with UE support   Neuromuscular Re-education  Standing on airex with alternating taps on 6" step with foam pad x 10 each leg, cued to take bend knee and take bigger steps to clear surface; Step over foam roller throw basketball, 10x each side; more challenging stepping backwards with occasional LOB, SPT performing CGA with PT providing ball toss;    Pt educated throughout session about proper posture and technique with exercises. Improved exercise technique, movement at target joints, use of target muscles after min to mod verbal, visual, tactile cues.      Pt demonstrates excellent motivation during session today. Session focused on a combination of strength and balance today. Increased difficulty of balance exercises to challenge coordination and reaching outside of base of support. Pt encouraged to follow-up as scheduled. She will benefit from PT services to address deficits in dizziness, balance, and mobility in order to return to full function at home.                      PT Short Term Goals - 07/29/19 1452      PT SHORT TERM GOAL #1   Title Pt will be independent with home exercise program in order to improve balance and leg strength to decrease fall risk and improve function at home    Baseline 07/29/19: Pt reports that she performs "every other day"    Time 6    Period Weeks    Status On-going    Target Date 08/06/19              PT Long Term Goals - 09/16/19 1524      PT LONG TERM GOAL #1   Title Pt will improve BERG to at least 50/56 in order to demonstrate clinically significant improvement in balance.    Baseline 06/24/19: 43/56; 07/29/19: 47/56; 6/22: 47/56 (unchanged)    Time 12    Period Weeks    Status On-going    Target Date 09/16/19      PT LONG TERM GOAL #2   Title Pt will improve ABC by at least 13% in order to demonstrate clinically significant improvement in balance confidence.    Baseline 06/24/19: 35%; 07/29/19: 55.625%    Time 12    Period Weeks  Status Achieved    Target Date 09/16/19      PT LONG TERM GOAL #3   Title Pt will increase self-selected 10MWT by at least 0.13 m/s in order to demonstrate clinically significant improvement in community ambulation.    Baseline 06/24/19: 14.3s = 0.70 m/s; 07/29/19: 11.5s = 0.87 m/s; on 6/22: 1.53m/s    Time 12    Period Weeks    Status Achieved    Target Date 09/16/19      PT LONG TERM GOAL #4   Title Pt will improve ABC to greater than 67% in order to demonstrate clinically significant improvement in balance confidence.    Baseline 07/29/19: 55.625%    Time 12    Period Weeks    Status New    Target Date 09/16/19      PT LONG TERM GOAL #5   Title pt will be able to demonstrate proper lifting technique 3/5 times in order to demonstrate improved balance and decrease risk fo falls.     Baseline 5/5 on 12/16/15    Time 4    Period Weeks    Status Achieved    Target Date 09/11/19      Additional Long Term Goals   Additional Long Term Goals Yes      PT LONG TERM GOAL #6   Title Pt will tolerated >1254ft in 6MWT to improve tolerance to AMB in the community for IADL performance.    Baseline Unable to tolerate more than 5 minutes AMB due to back pain.    Time 8    Period Weeks    Status New    Target Date 11/11/19                 Plan - 10/16/19 1801    Clinical Impression Statement Pt demonstrates excellent motivation during  session today. Session focused on a combination of strength and balance today. Increased difficulty of balance exercises to challenge coordination and reaching outside of base of support. Pt encouraged to follow-up as scheduled. She will benefit from PT services to address deficits in dizziness, balance, and mobility in order to return to full function at home.    Personal Factors and Comorbidities Age;Comorbidity 3+;Past/Current Experience;Time since onset of injury/illness/exacerbation    Comorbidities Neuropathy, chronic back pain, MI, OA, CAD, HTN    Examination-Activity Limitations Bend;Locomotion Level;Lift;Stairs;Stand    Examination-Participation Restrictions Community Activity;Laundry;Yard Work    Stability/Clinical Decision Making Unstable/Unpredictable    Rehab Potential Fair    PT Frequency 2x / week    PT Duration 12 weeks    PT Treatment/Interventions ADLs/Self Care Home Management;Aquatic Therapy;Biofeedback;Canalith Repostioning;Cryotherapy;Electrical Stimulation;Iontophoresis 4mg /ml Dexamethasone;Moist Heat;Traction;Ultrasound;DME Instruction;Gait training;Stair training;Functional mobility training;Therapeutic activities;Therapeutic exercise;Balance training;Neuromuscular re-education;Cognitive remediation;Patient/family education;Manual techniques;Passive range of motion;Dry needling;Vestibular;Spinal Manipulations;Joint Manipulations    PT Next Visit Plan Update outcome measures, goals, progress note. Continue with strength and balance exercises during session. Especially eccentric quad control.    PT Home Exercise Plan Medbridge Access Code: [email or web address]    Consulted and Agree with Plan of Care Patient           Patient will benefit from skilled therapeutic intervention in order to improve the following deficits and impairments:  Abnormal gait, Decreased activity tolerance, Decreased balance, Decreased mobility, Decreased strength, Difficulty walking, Impaired sensation,  Pain  Visit Diagnosis: Unsteadiness on feet  Muscle weakness (generalized)     Problem List Patient Active Problem List   Diagnosis Date Noted  . Spondylosis without myelopathy or radiculopathy, lumbar region 09/04/2019  .  Chronic pain syndrome 09/04/2019  . Chemotherapy-induced neuropathy ([place]) 04/17/2018  . Neuropathy 04/17/2018  . Chemotherapy-induced peripheral neuropathy ([place]) 03/01/2018  . Persistent atrial fibrillation ([place])   . Port-A-Cath in place 03/28/2017  . Bradycardia 01/18/2017  . Obesity (BMI 30-39.9) 01/17/2017  . Pulmonary nodules 12/05/2016  . Compression fracture of L5 vertebra with delayed healing 11/01/2016  . Sacral insufficiency fracture with delayed healing 11/01/2016  . Sacral insufficiency fracture 09/05/2016  . Absence of bladder continence 07/21/2015  . Neuropathy associated with cancer ([place]) 06/02/2015  . Carcinoma of endometrium ([place]) 04/02/2015  . Gastro-esophageal reflux disease without esophagitis 02/15/2015  . Osteopenia 08/17/2014  . Hyperlipidemia 02/13/2014  . Kidney stones 10/27/2013  . CA skin, basal cell 06/30/2013  . Spinal stenosis, lumbar region, with neurogenic claudication 06/30/2013  . Arthritis, degenerative 06/30/2013  . Neuralgia neuritis, sciatic nerve 06/30/2013  . Coronary artery disease 10/25/2012  . Dyslipidemia 10/25/2012  . NSVT (nonsustained ventricular tachycardia) ([place]) 10/25/2012  . Sick sinus syndrome ([place]) 10/25/2012  . Ischemic cardiomyopathy 10/22/2012  . Old anterior wall myocardial infarction 10/20/2012  . Hypertensive heart disease without CHF 10/20/2012    This entire session was performed under direct supervision and direction of a licensed therapist/therapist assistant . I have personally read, edited and approve of the note as written.   Noemi Chapel, SPT Phillips Grout PT, DPT, GCS  Huprich,Jason 10/17/2019, 9:18 AM  [place] MAIN [facility] SERVICES [address] [place], [place], [address] Phone: [phone]   Fax:  [phone]  Name: JOEANNE ROBICHEAUX MRN: [id number] Date of Birth: [birth year]

## 2019-10-21 ENCOUNTER — Other Ambulatory Visit: Payer: Self-pay

## 2019-10-21 ENCOUNTER — Ambulatory Visit: Payer: Medicare PPO

## 2019-10-21 DIAGNOSIS — R2681 Unsteadiness on feet: Secondary | ICD-10-CM

## 2019-10-21 DIAGNOSIS — M6281 Muscle weakness (generalized): Secondary | ICD-10-CM

## 2019-10-21 NOTE — Therapy (Signed)
[place] MAIN [facility] SERVICES [address] [place], [place], [address] Phone: [phone]   Fax:  [phone]  Physical Therapy Treatment / Progress Note  Dates of reporting period  09/16/19   to   10/21/19  Patient Details  Name: Susan [name] MRN: [id number] Date of Birth: 1935/02/19 Referring Provider (PT): Dr. Melrose Nakayama   Encounter Date: 10/21/2019   PT End of Session - 10/21/19 1439    Visit Number 30    Number of Visits 32    Date for PT Re-Evaluation 11/11/19    Authorization Type eval 06/24/19, Progress note: 10/21/19    PT Start Time 1352    PT Stop Time 1430    PT Time Calculation (min) 38 min    Equipment Utilized During Treatment Gait belt    Activity Tolerance Patient tolerated treatment well;Patient limited by pain    Behavior During Therapy WFL for tasks assessed/performed           Past Medical History:  Diagnosis Date  . Bradycardia   . Chronic back pain   . Coronary artery disease    a. s/p STEMI in 2014 with DES to LAD with staged PCI/DES placement to LCx  . Echoencephalogram abnormality   . Heart murmur   . History of Holter monitoring   . Hyperlipidemia   . Hypertension   . Myocardial infarction ([place])   . Neuromuscular disorder (HCC)    neuropathy  . NSVT (nonsustained ventricular tachycardia) ([place])   . Personal history of chemotherapy    ENDOMETRIAL CA  . Personal history of radiation therapy    ENDOMETRIAL CA  . Uterine cancer ([place])    serrous    Past Surgical History:  Procedure Laterality Date  . ABDOMINAL HYSTERECTOMY    . BREAST EXCISIONAL BIOPSY Right 1956   NEG  . CARDIOVERSION N/A 07/27/2017   Procedure: CARDIOVERSION;  Surgeon: Skeet Latch, MD;  Location: [facility];  Service: Cardiovascular;  Laterality: N/A;  . LEFT HEART CATHETERIZATION WITH CORONARY ANGIOGRAM  10/19/2012   Procedure: LEFT HEART CATHETERIZATION WITH CORONARY ANGIOGRAM;  Surgeon: Troy Sine, MD;  Location: [facility]  CATH LAB;  Service: Cardiovascular;;  . PERCUTANEOUS CORONARY STENT INTERVENTION (PCI-S)  10/19/2012   Procedure: PERCUTANEOUS CORONARY STENT INTERVENTION (PCI-S);  Surgeon: Troy Sine, MD;  Location: [facility] CATH LAB;  Service: Cardiovascular;;  . PERCUTANEOUS CORONARY STENT INTERVENTION (PCI-S) N/A 10/23/2012   Procedure: PERCUTANEOUS CORONARY STENT INTERVENTION (PCI-S);  Surgeon: Troy Sine, MD;  Location: [facility] CATH LAB;  Service: Cardiovascular;  Laterality: N/A;  . PERIPHERAL VASCULAR CATHETERIZATION N/A 05/05/2015   Procedure: Glori Luis Cath Insertion;  Surgeon: Algernon Huxley, MD;  Location: [place] CV LAB;  Service: Cardiovascular;  Laterality: N/A;  . PORTA CATH REMOVAL N/A 04/05/2017   Procedure: PORTA CATH REMOVAL;  Surgeon: Algernon Huxley, MD;  Location: [place] CV LAB;  Service: Cardiovascular;  Laterality: N/A;    There were no vitals filed for this visit.   Subjective Assessment - 10/21/19 1353    Subjective Pt reports that she is feeling tired today as she woke up this morning at 6:30 for a car appointment. Her neurologist gave her a medication that makes it hard to wake up. Patient took 2 pain pills before she came so the back wouldn't bother her during the session. No specific questions or concerns at this time.    Pertinent History Pt reports imbalance related to chemo-induced neuropathy which started 4 years ago. Neuropathy occurs  in stocking distribution from knees to feet. Symptoms have been unchanged since that time. She has had one fall in the last 6 months. Denies dizziness or vertigo. No heachaches/migraines. She has a history of chronic back pain and has had a bilateral nerve ablation. Her husband passed away 4 weeks ago after contracting COVID.    Limitations Standing;Walking    How long can you stand comfortably? 5 minutes, (unchanges since last visit) limited due to back pain    How long can you walk comfortably? 5-10 minutes, (unchanges since last visit) limited due to  back pain    Patient Stated Goals Strengthen her back muscles to decrease pain and improve her balance    Currently in Pain? No/denies              [facility] PT Assessment - 10/21/19 1356      6 Minute Walk- Baseline   6 Minute Walk- Baseline --   Vitals BP: 127/66; O2: 99; HR: 76     6 Minute walk- Post Test   6 Minute Walk Post Test --   BP: 160/86; O2: 88, HR: 105     6 minute walk test results    Aerobic Endurance Distance Walked 815    Endurance additional comments Cane used during last 2 minutes of the test      [employer] to Stand Able to stand without using hands and stabilize independently    Standing Unsupported Able to stand safely 2 minutes    Sitting with Back Unsupported but Feet Supported on Floor or Stool Able to sit safely and securely 2 minutes    Stand to Sit Sits safely with minimal use of hands    Transfers Able to transfer safely, minor use of hands    Standing Unsupported with Eyes Closed Able to stand 10 seconds with supervision    Standing Unsupported with Feet Together Able to place feet together independently and stand for 1 minute with supervision    From Standing, Reach Forward with Outstretched Arm Can reach forward >12 cm safely (5")    From Standing Position, Pick up Object from Floor Able to pick up shoe safely and easily    From Standing Position, Turn to Look Behind Over each Shoulder Looks behind from both sides and weight shifts well    Turn 360 Degrees Able to turn 360 degrees safely one side only in 4 seconds or less    Standing Unsupported, Alternately Place Feet on Step/Stool Able to stand independently and safely and complete 8 steps in 20 seconds    Standing Unsupported, One Foot in Front Able to plae foot ahead of the other independently and hold 30 seconds    Standing on One Leg Able to lift leg independently and hold 5-10 seconds    Total Score 50             TREATMENT   Therapeutic Exercises: Updated goals and  outcomes measures. See table below.   06/24/19 10/21/19 Comments  BERG 43/56 50/56   6MWT  815 feet Last 2 minutes used cane due to low back pain.  ABC Scale 35% 77.5% WNL     Pt educated throughout session about proper posture and technique with exercises. Improved exercise technique, movement at target joints, use of target muscles after min to mod verbal, visual, tactile cues.      Pt demonstrates excellent motivation during session today. Updated outcome measures and goals with patient. Her ABC, BERG,  and 6MWT all improved. She has met all her goals except the 6MWT. During the test, she utilized a cane during the last 2 minutes due to throbbing low back. Patient was fatigued after the walk test, so utilized a long rest break. Pt will benefit from PT services to address deficits in strength, balance, and mobility in order to return to full function at home.            PT Short Term Goals - 07/29/19 1452      PT SHORT TERM GOAL #1   Title Pt will be independent with home exercise program in order to improve balance and leg strength to decrease fall risk and improve function at home    Baseline 07/29/19: Pt reports that she performs "every other day"    Time 6    Period Weeks    Status On-going    Target Date 08/06/19             PT Long Term Goals - 10/21/19 1440      PT LONG TERM GOAL #1   Title Pt will improve BERG to at least 50/56 in order to demonstrate clinically significant improvement in balance.    Baseline 06/24/19: 43/56; 07/29/19: 47/56; 6/22: 47/56 (unchanged); 7/27: 50/56    Time 12    Period Weeks    Status Achieved      PT LONG TERM GOAL #2   Title Pt will improve ABC by at least 13% in order to demonstrate clinically significant improvement in balance confidence.    Baseline 06/24/19: 35%; 07/29/19: 55.625%    Time 12    Period Weeks    Status Achieved      PT LONG TERM GOAL #3   Title Pt will increase self-selected 10MWT by at least 0.13 m/s in order to  demonstrate clinically significant improvement in community ambulation.    Baseline 06/24/19: 14.3s = 0.70 m/s; 07/29/19: 11.5s = 0.87 m/s; on 6/22: 1.89ms    Time 12    Period Weeks    Status Achieved      PT LONG TERM GOAL #4   Title Pt will improve ABC to greater than 67% in order to demonstrate clinically significant improvement in balance confidence.    Baseline 07/29/19: 55.625%; 7/27: 77.5%    Time 12    Period Weeks    Status Achieved      PT LONG TERM GOAL #5   Title pt will be able to demonstrate proper lifting technique 3/5 times in order to demonstrate improved balance and decrease risk fo falls.     Baseline 5/5 on 12/16/15    Time 4    Period Weeks    Status Achieved      PT LONG TERM GOAL #6   Title Pt will tolerated >12025fin 6MWT to improve tolerance to AMB in the community for IADL performance.    Baseline 6/22: Unable to tolerate more than 5 minutes AMB due to back pain.; 7/27: 815 feet, last 2 minutes used cane due to back pain    Time 8    Period Weeks    Status On-going    Target Date 11/11/19                 Plan - 10/21/19 1439    Clinical Impression Statement Pt demonstrates excellent motivation during session today. Updated outcome measures and goals with patient. Her ABC, BERG, and 6MWT all improved. She has met all her goals except the  6MWT. During the test, she utilized a cane during the last 2 minutes due to throbbing low back. Patient was fatigued after the walk test, so utilized a long rest break. Pt will benefit from PT services to address deficits in strength, balance, and mobility in order to return to full function at home.    Personal Factors and Comorbidities Age;Comorbidity 3+;Past/Current Experience;Time since onset of injury/illness/exacerbation    Comorbidities Neuropathy, chronic back pain, MI, OA, CAD, HTN    Examination-Activity Limitations Bend;Locomotion Level;Lift;Stairs;Stand    Examination-Participation Restrictions Community  Activity;Laundry;Yard Work    Stability/Clinical Decision Making Unstable/Unpredictable    Rehab Potential Fair    PT Frequency 2x / week    PT Duration 12 weeks    PT Treatment/Interventions ADLs/Self Care Home Management;Aquatic Therapy;Biofeedback;Canalith Repostioning;Cryotherapy;Electrical Stimulation;Iontophoresis 22m/ml Dexamethasone;Moist Heat;Traction;Ultrasound;DME Instruction;Gait training;Stair training;Functional mobility training;Therapeutic activities;Therapeutic exercise;Balance training;Neuromuscular re-education;Cognitive remediation;Patient/family education;Manual techniques;Passive range of motion;Dry needling;Vestibular;Spinal Manipulations;Joint Manipulations    PT Next Visit Plan Update outcome measures, goals, progress note. Continue with strength and balance exercises during session. Especially eccentric quad control.    PT Home Exercise Plan Medbridge Access Code: [email or web address]    Consulted and Agree with Plan of Care Patient           Patient will benefit from skilled therapeutic intervention in order to improve the following deficits and impairments:  Abnormal gait, Decreased activity tolerance, Decreased balance, Decreased mobility, Decreased strength, Difficulty walking, Impaired sensation, Pain  Visit Diagnosis: Unsteadiness on feet  Muscle weakness (generalized)     Problem List Patient Active Problem List   Diagnosis Date Noted  . Spondylosis without myelopathy or radiculopathy, lumbar region 09/04/2019  . Chronic pain syndrome 09/04/2019  . Chemotherapy-induced neuropathy ([place] 04/17/2018  . Neuropathy 04/17/2018  . Chemotherapy-induced peripheral neuropathy ([place] 03/01/2018  . Persistent atrial fibrillation ([place]   . Port-A-Cath in place 03/28/2017  . Bradycardia 01/18/2017  . Obesity (BMI 30-39.9) 01/17/2017  . Pulmonary nodules 12/05/2016  . Compression fracture of L5 vertebra with delayed healing 11/01/2016  . Sacral insufficiency fracture with  delayed healing 11/01/2016  . Sacral insufficiency fracture 09/05/2016  . Absence of bladder continence 07/21/2015  . Neuropathy associated with cancer ([place] 06/02/2015  . Carcinoma of endometrium ([place] 04/02/2015  . Gastro-esophageal reflux disease without esophagitis 02/15/2015  . Osteopenia 08/17/2014  . Hyperlipidemia 02/13/2014  . Kidney stones 10/27/2013  . CA skin, basal cell 06/30/2013  . Spinal stenosis, lumbar region, with neurogenic claudication 06/30/2013  . Arthritis, degenerative 06/30/2013  . Neuralgia neuritis, sciatic nerve 06/30/2013  . Coronary artery disease 10/25/2012  . Dyslipidemia 10/25/2012  . NSVT (nonsustained ventricular tachycardia) ([place] 10/25/2012  . Sick sinus syndrome ([place] 10/25/2012  . Ischemic cardiomyopathy 10/22/2012  . Old anterior wall myocardial infarction 10/20/2012  . Hypertensive heart disease without CHF 10/20/2012    This entire session was performed under direct supervision and direction of a licensed therapist/therapist assistant . I have personally read, edited and approve of the note as written.   KNoemi Chapel SPT JPhillips GroutPT, DPT, GCS  Huprich,Jason 10/22/2019, 10:41 AM  CEighty FourMAIN RGory[facility] CenterSERVICES [address] [place] [address]Phone: [phone]  Fax:  [phone] Name: Susan LAPPMRN: [id number] of Birth: [birth year]

## 2019-10-22 ENCOUNTER — Encounter: Payer: Self-pay | Admitting: Student in an Organized Health Care Education/Training Program

## 2019-10-22 ENCOUNTER — Other Ambulatory Visit: Payer: Self-pay

## 2019-10-22 ENCOUNTER — Ambulatory Visit
Payer: Medicare PPO | Attending: Student in an Organized Health Care Education/Training Program | Admitting: Student in an Organized Health Care Education/Training Program

## 2019-10-22 VITALS — BP 116/71 | HR 68 | Temp 97.2°F | Resp 16 | Ht 64.0 in | Wt 214.0 lb

## 2019-10-22 DIAGNOSIS — M5136 Other intervertebral disc degeneration, lumbar region: Secondary | ICD-10-CM | POA: Diagnosis not present

## 2019-10-22 DIAGNOSIS — M48062 Spinal stenosis, lumbar region with neurogenic claudication: Secondary | ICD-10-CM | POA: Diagnosis not present

## 2019-10-22 DIAGNOSIS — M47816 Spondylosis without myelopathy or radiculopathy, lumbar region: Secondary | ICD-10-CM | POA: Insufficient documentation

## 2019-10-22 DIAGNOSIS — G894 Chronic pain syndrome: Secondary | ICD-10-CM | POA: Insufficient documentation

## 2019-10-22 DIAGNOSIS — C801 Malignant (primary) neoplasm, unspecified: Secondary | ICD-10-CM | POA: Insufficient documentation

## 2019-10-22 DIAGNOSIS — M8448XA Pathological fracture, other site, initial encounter for fracture: Secondary | ICD-10-CM | POA: Diagnosis present

## 2019-10-22 DIAGNOSIS — M1711 Unilateral primary osteoarthritis, right knee: Secondary | ICD-10-CM | POA: Insufficient documentation

## 2019-10-22 DIAGNOSIS — G63 Polyneuropathy in diseases classified elsewhere: Secondary | ICD-10-CM | POA: Diagnosis present

## 2019-10-22 NOTE — Progress Notes (Signed)
PROVIDER NOTE: Information contained herein reflects review and annotations entered in association with encounter. Interpretation of such information and data should be left to medically-trained personnel. Information provided to patient can be located elsewhere in the medical record under "Patient Instructions". Document created using STT-dictation technology, any transcriptional errors that may result from process are unintentional.    Patient: Susan [name]  Service Category: E/M  Provider: Gillis Santa, MD  DOB: [birth year]  DOS: 10/22/2019  Specialty: Interventional Pain Management  MRN: [id number]  Setting: Ambulatory outpatient  PCP: [name], MD  Type: Established Patient    Referring Provider: [name], MD  Location: Office  Delivery: Face-to-face     HPI  Reason for encounter: [name], a 85 y.o. year old female, is here today for evaluation and management of her Spinal stenosis, lumbar region, with neurogenic claudication [M48.062]. Ms. Curbow primary complain today is Back Pain (low) Last encounter: Practice (09/25/2019). My last encounter with her was on 09/25/2019. Pertinent problems: Ms. Pletz has Ischemic cardiomyopathy; Coronary artery disease; Spinal stenosis, lumbar region, with neurogenic claudication; Osteopenia; Arthritis, degenerative; Carcinoma of endometrium ([place]); Neuropathy associated with cancer ([place]); Chemotherapy-induced peripheral neuropathy ([place]); Chemotherapy-induced neuropathy ([place]); Compression fracture of L5 vertebra with delayed healing; Neuropathy; Sacral insufficiency fracture with delayed healing; Lumbar facet arthropathy; and Chronic pain syndrome on their pertinent problem list. Pain Assessment: Severity of Chronic pain is reported as a 5 /10. Location: Back Lower/rarely. Onset: More than a month ago. Quality:  (grabbing sensation). Timing: Intermittent. Modifying factor(s): rest, feet up, ice, medications. Vitals:  height is '5\' 4"'   (1.626 m) and weight is 214 lb (97.1 kg) (abnormal). Her temporal temperature is 97.2 F (36.2 C) (abnormal). Her blood pressure is 116/71 and her pulse is 68. Her respiration is 16 and oxygen saturation is 95%.    Patient presents to discuss her MRI, results of which are below.  Since her last visit, she has been started on Pamelor by neurology for her neuropathy.  She states that she also tried Lyrica for a short period of time but discontinued as she did not like how it made her feel.  She has taken gabapentin in the past which was not effective.  She is asking whether she can take tizanidine as needed which I told her is reasonable as long as she takes it 1 hour apart from her Pamelor.  Patient endorsed understanding.    ROS  Constitutional: Denies any fever or chills Gastrointestinal: No reported hemesis, hematochezia, vomiting, or acute GI distress Musculoskeletal: Low back pain, proximal hip pain Neurological: No reported episodes of acute onset apraxia, aphasia, dysarthria, agnosia, amnesia, paralysis, loss of coordination, or loss of consciousness  Medication Review  acetaminophen, apixaban, aspirin EC, atorvastatin, ezetimibe, fluticasone, furosemide, isosorbide mononitrate, loperamide, losartan, mirabegron ER, multivitamin with minerals, nitroGLYCERIN, nortriptyline, omeprazole, tiZANidine, and vitamin B-12  History Review  Allergy: [name] is allergic to ace inhibitors and penicillins. Drug: Ms. Rodier  reports no history of drug use. Alcohol:  reports current alcohol use of about 1.0 standard drink of alcohol per week. Tobacco:  reports that she quit smoking about 45 years ago. She has never used smokeless tobacco. Social: Ms. Hanline  reports that she quit smoking about 45 years ago. She has never used smokeless tobacco. She reports current alcohol use of about 1.0 standard drink of alcohol per week. She reports that she does not use drugs. Medical:  has a past medical  history of Bradycardia, Chronic back pain, Coronary  artery disease, Echoencephalogram abnormality, Heart murmur, History of Holter monitoring, Hyperlipidemia, Hypertension, Myocardial infarction [facility]), Neuromuscular disorder ([place]), NSVT (nonsustained ventricular tachycardia) ([place]), Personal history of chemotherapy, Personal history of radiation therapy, and Uterine cancer ([place]). Surgical: Ms. Sagona  has a past surgical history that includes left heart catheterization with coronary angiogram (10/19/2012); percutaneous coronary stent intervention (pci-s) (10/19/2012); percutaneous coronary stent intervention (pci-s) (N/A, 10/23/2012); Abdominal hysterectomy; Cardiac catheterization (N/A, 05/05/2015); Breast excisional biopsy (Right, 1956); PORTA CATH REMOVAL (N/A, 04/05/2017); and Cardioversion (N/A, 07/27/2017). Family: family history includes Breast cancer (age of onset: 16) in her mother; Diabetes in her brother and father; Heart disease in her father and mother; Stroke in her brother.  Laboratory Chemistry Profile   Renal Lab Results  Component Value Date   BUN 18 02/26/2019   CREATININE 1.06 (H) 02/26/2019   GFRAA 57 (L) 02/26/2019   GFRNONAA 49 (L) 02/26/2019     Hepatic Lab Results  Component Value Date   AST 25 02/26/2019   ALT 14 02/26/2019   ALBUMIN 4.0 02/26/2019   ALKPHOS 92 02/26/2019     Electrolytes Lab Results  Component Value Date   NA 137 02/26/2019   K 3.7 02/26/2019   CL 103 02/26/2019   CALCIUM 9.5 02/26/2019   MG 2.2 07/14/2015     Bone No results found for: VD25OH, VD125OH2TOT, BJ4782NF6, OZ3086VH8, 25OHVITD1, 25OHVITD2, 25OHVITD3, TESTOFREE, TESTOSTERONE   Inflammation (CRP: Acute Phase) (ESR: Chronic Phase) No results found for: CRP, ESRSEDRATE, LATICACIDVEN     Note: Above Lab results reviewed.  Recent Imaging Review  MR LUMBAR SPINE WO CONTRAST CLINICAL DATA:  Low back pain with lumbar radiculopathy.  EXAM: MRI LUMBAR SPINE WITHOUT  CONTRAST  TECHNIQUE: Multiplanar, multisequence MR imaging of the lumbar spine was performed. No intravenous contrast was administered.  COMPARISON:  MRI of the lumbar spine December 12, 2017  FINDINGS: Segmentation:  Standard.  Alignment: Levoconvex scoliosis of the lumbar spine. A grade 1 anterolisthesis of L4 over L5, unchanged. Small retrolisthesis of L2 over L3.  Vertebrae: No fracture or evidence of discitis. T1 hypointense/T2 hyperintense lesions are seen within the sacrum bilaterally on sagittal images, only partially evaluated. However, does appear stable since prior MRI. Endplate degenerative changes throughout the lumbar spine, more pronounced at L1-2 and L3-4.  Conus medullaris and cauda equina: Conus extends to the T12-L1 level. Conus and cauda equina appear normal.  Paraspinal and other soft tissues: Bilateral renal cysts  Disc levels:  T11-12: Left central posterior disc osteophyte complex causing indentation of the thecal sac without significant spinal canal or neural foraminal stenosis.  T12-L1: Left asymmetric disc bulge and mild facet degenerative changes. No significant spinal canal or neural foraminal stenosis.  L1-2: Loss of disc height, disc bulge with associated osteophytic component and superimposed small central disc extrusion migrating inferiorly. There are also facet degenerative changes and ligamentum flavum redundancy, findings result in narrowing of the right subarticular zone, mild spinal canal stenosis and moderate bilateral neural foraminal narrowing.  L2-3: Loss of disc height with partial disc space fusion, right foraminal and extraforaminal disc protrusion with prominent osteophytic component and superimposed ossified right central disc extrusion. There are also facet degenerative changes and ligamentum flavum redundancy. Findings result in mild spinal canal stenosis with effacement of the right subarticular zone and severe  right neural foraminal narrowing.  L3-4: Loss of disc height, right asymmetric disc bulge with associated osteophytic component and facet degenerative changes resulting in mild spinal canal stenosis with narrowing of the bilateral subarticular zones,  moderate to severe right and moderate left neural foraminal narrowing.  L4-5: Disc bulge with superimposed right central disc protrusion migrating superiorly, prominent facet degenerative changes with ligamentum flavum redundancy as well as inter spinous degeneration. Findings result in severe spinal canal stenosis which appears mildly progressed from prior MRI and mild bilateral neural foraminal narrowing.  L5-S1: Left asymmetric disc bulge, facet degenerative changes ligamentum flavum redundancy resulting in narrowing of the left subarticular zone and moderate left neural foraminal narrowing.  IMPRESSION: 1. Multilevel degenerative changes of the lumbar spine as described above, mildy progressed at L4-5 where there is severe spinal canal stenosis and mild bilateral neural foraminal narrowing. 2. Mild spinal canal stenosis at L1-2, L2-3, and L3-4. 3. Severe right neural foraminal narrowing at L2-3 and moderate to severe right neural foraminal narrowing at L3-4. 4. Moderate left neural foraminal narrowing at L5-S1. 5. Unchanged T1 hypointense/T2 hyperintense lesions within the sacrum bilaterally, only partially evaluated.  These results will be called to the ordering clinician or representative by the Radiologist Assistant, and communication documented in the PACS or [employer].  Electronically Signed   By: Pedro Earls M.D.   On: 09/25/2019 10:47 Note: Reviewed        Physical Exam  General appearance: Well nourished, well developed, and well hydrated. In no apparent acute distress Mental status: Alert, oriented x 3 (person, place, & time)       Respiratory: No evidence of acute respiratory  distress Eyes: PERLA Vitals: BP 116/71    Pulse 68    Temp (!) 97.2 F (36.2 C) (Temporal)    Resp 16    Ht '5\' 4"'  (1.626 m)    Wt (!) 214 lb (97.1 kg)    SpO2 95%    BMI 36.73 kg/m  BMI: Estimated body mass index is 36.73 kg/m as calculated from the following:   Height as of this encounter: '5\' 4"'  (1.626 m).   Weight as of this encounter: 214 lb (97.1 kg). Ideal: Ideal body weight: 54.7 kg (120 lb 9.5 oz) Adjusted ideal body weight: 71.6 kg (157 lb 15.3 oz)  Lumbar Spine Area Exam  Skin & Axial Inspection: No masses, redness, or swelling Alignment: Symmetrical Functional ROM: Pain restricted ROM       Stability: No instability detected Muscle Tone/Strength: Functionally intact. No obvious neuro-muscular anomalies detected. Sensory (Neurological): Neurogenic pain pattern Palpation: No palpable anomalies       Provocative Tests: Hyperextension/rotation test: (+) due to pain. Lumbar quadrant test (Kemp's test): deferred today       Lateral bending test: deferred today       Patrick's Maneuver: deferred today                   FABER* test: deferred today                   S-I anterior distraction/compression test: deferred today         S-I lateral compression test: deferred today         S-I Thigh-thrust test: deferred today         S-I Gaenslen's test: deferred today         *(Flexion, ABduction and External Rotation) Gait & Posture Assessment  Ambulation: Patient came in today in a wheel chair Gait: Significantly limited. Dependent on assistive device to ambulate Posture: Difficulty standing up straight, due to pain  Lower Extremity Exam    Side: Right lower extremity  Side: Left lower extremity  Stability: No instability observed          Stability: No instability observed          Skin & Extremity Inspection: Skin color, temperature, and hair growth are WNL. No peripheral edema or cyanosis. No masses, redness, swelling, asymmetry, or associated skin lesions. No contractures.   Skin & Extremity Inspection: Skin color, temperature, and hair growth are WNL. No peripheral edema or cyanosis. No masses, redness, swelling, asymmetry, or associated skin lesions. No contractures.  Functional ROM: Unrestricted ROM                  Functional ROM: Unrestricted ROM                  Muscle Tone/Strength: Functionally intact. No obvious neuro-muscular anomalies detected.  Muscle Tone/Strength: Functionally intact. No obvious neuro-muscular anomalies detected.  Sensory (Neurological): Unimpaired        Sensory (Neurological): Unimpaired        DTR: Patellar: deferred today Achilles: deferred today Plantar: deferred today  DTR: Patellar: deferred today Achilles: deferred today Plantar: deferred today  Palpation: No palpable anomalies  Palpation: No palpable anomalies    Assessment   Status Diagnosis  Persistent Persistent Persistent 1. Spinal stenosis, lumbar region, with neurogenic claudication   2. Lumbar degenerative disc disease   3. Spondylosis without myelopathy or radiculopathy, lumbar region   4. Lumbar facet arthropathy   5. Neuropathy associated with cancer ([place])   6. Sacral insufficiency fracture, initial encounter   7. Primary osteoarthritis of right knee   8. Chronic pain syndrome      Updated Problems: Problem  Lumbar Facet Arthropathy  Chronic Pain Syndrome  Neuropathy  Chemotherapy-Induced Peripheral Neuropathy ([facility])  Neuropathy Associated With Cancer ([facility])  Osteopenia  Arthritis, Degenerative  Lumbar Degenerative Disc Disease    Plan of Care    Had an extensive discussion with the patient to review her lumbar MRI.  MRI of lumbar spine shows severe right neuroforaminal narrowing at L2-L3 and moderate to severe right neuroforaminal narrowing at L3-L4.  She also has severe canal stenosis at L4-L5.  This was measured on axial scans and her ligamentum flavum was severely hypertrophic on the left side at approximately 6.7 mm.  Given her symptoms of  spinal stenosis with neurogenic claudication with associated decreased ability to walk or stand for extended period of time without having pain and weakness in her legs and MRI scan of her ligamentum flavum being greater than 2.5 mm (6.7 mm), we had a discussion regarding a minimally invasive lumbar decompression.  We discussed how this requires a very small incision to debulk the hypertrophic ligamentum flavum that is compressing her nerve roots and likely causing her to have pain and difficulty walking.  Patient will think about this further, she is going out of town to [place] and she will follow up with me at the beginning of September to discuss mild and whether she would like to move forward with it.  This is reasonable.  Follow-up plan:   Return in about 6 weeks (around 12/02/2019) for discuss MILD.     status post left L3, L4, L5 RFA on 01/16/2018. right L3, L4, L5 RFA: 04/14/2019; successful, 85% pain relief in lower lumbar region.      Recent Visits Date Type Provider Dept  09/04/19 Office Visit Gillis Santa, MD [facility]  Showing recent visits within past 90 days and meeting all other requirements Today's Visits Date Type Provider Dept  10/22/19 Office  Visit Gillis Santa, MD [facility]  Showing today's visits and meeting all other requirements Future Appointments Date Type Provider Dept  12/04/19 Appointment Gillis Santa, MD [facility]  Showing future appointments within next 90 days and meeting all other requirements  I discussed the assessment and treatment plan with the patient. The patient was provided an opportunity to ask questions and all were answered. The patient agreed with the plan and demonstrated an understanding of the instructions.  Patient advised to call back or seek an in-person evaluation if the symptoms or condition worsens.  Duration of encounter: 30 minutes.  Note by: Gillis Santa, MD Date: 10/22/2019; Time: 2:34 PM

## 2019-10-22 NOTE — Progress Notes (Deleted)
PROVIDER NOTE: Information contained herein reflects review and annotations entered in association with encounter. Interpretation of such information and data should be left to medically-trained personnel. Information provided to patient can be located elsewhere in the medical record under "Patient Instructions". Document created using STT-dictation technology, any transcriptional errors that may result from process are unintentional.    Patient: Susan [name]  Service Category: E/M  Provider: Gillis Santa, MD  DOB: 1934-10-25  DOS: 10/22/2019  Specialty: Interventional Pain Management  MRN: [id number]  Setting: Ambulatory outpatient  PCP: [name], MD  Type: Established Patient    Referring Provider: [name], MD  Location: Office  Delivery: Face-to-face     HPI  Reason for encounter: [name], a 85 y.o. year old female, is here today for evaluation and management of her No primary diagnosis found.. Ms. Mirkin primary complain today is Back Pain (low) Last encounter: Practice (09/25/2019). My last encounter with her was on 09/25/2019. Pertinent problems: Ms. Wan has Ischemic cardiomyopathy; Coronary artery disease; Spinal stenosis, lumbar region, with neurogenic claudication; Osteopenia; Arthritis, degenerative; Carcinoma of endometrium ([place]); Neuropathy associated with cancer ([place]); Chemotherapy-induced peripheral neuropathy ([place]); Chemotherapy-induced neuropathy ([place]); Compression fracture of L5 vertebra with delayed healing; Neuropathy; Sacral insufficiency fracture with delayed healing; Spondylosis without myelopathy or radiculopathy, lumbar region; and Chronic pain syndrome on their pertinent problem list. Pain Assessment: Severity of Chronic pain is reported as a 5 /10. Location: Back Lower/rarely. Onset: More than a month ago. Quality:  (grabbing sensation). Timing: Intermittent. Modifying factor(s): rest, feet up, ice, medications. Vitals:  height is '5\' 4"'   (1.626 m) and weight is 214 lb (97.1 kg) (abnormal). Her temporal temperature is 97.2 F (36.2 C) (abnormal). Her blood pressure is 116/71 and her pulse is 68. Her respiration is 16 and oxygen saturation is 95%.     ROS  Constitutional: Denies any fever or chills Gastrointestinal: No reported hemesis, hematochezia, vomiting, or acute GI distress Musculoskeletal: Denies any acute onset joint swelling, redness, loss of ROM, or weakness   Medication Review  acetaminophen, apixaban, aspirin EC, atorvastatin, ciprofloxacin, ezetimibe, fluticasone, furosemide, isosorbide mononitrate, loperamide, losartan, mirabegron ER, multivitamin with minerals, nitroGLYCERIN, nortriptyline, omeprazole, tiZANidine, and vitamin B-12  History Review  Allergy: Ms. Shawhan is allergic to ace inhibitors and penicillins. Drug: Ms. Troiano  reports no history of drug use. Alcohol:  reports current alcohol use of about 1.0 standard drink of alcohol per week. Tobacco:  reports that she quit smoking about 45 years ago. She has never used smokeless tobacco. Social: Ms. Maners  reports that she quit smoking about 45 years ago. She has never used smokeless tobacco. She reports current alcohol use of about 1.0 standard drink of alcohol per week. She reports that she does not use drugs. Medical:  has a past medical history of Bradycardia, Chronic back pain, Coronary artery disease, Echoencephalogram abnormality, Heart murmur, History of Holter monitoring, Hyperlipidemia, Hypertension, Myocardial infarction [facility]), Neuromuscular disorder ([place]), NSVT (nonsustained ventricular tachycardia) ([place]), Personal history of chemotherapy, Personal history of radiation therapy, and Uterine cancer ([place]). Surgical: Ms. Gaugh  has a past surgical history that includes left heart catheterization with coronary angiogram (10/19/2012); percutaneous coronary stent intervention (pci-s) (10/19/2012); percutaneous coronary stent intervention (pci-s)  (N/A, 10/23/2012); Abdominal hysterectomy; Cardiac catheterization (N/A, 05/05/2015); Breast excisional biopsy (Right, 1956); PORTA CATH REMOVAL (N/A, 04/05/2017); and Cardioversion (N/A, 07/27/2017). Family: family history includes Breast cancer (age of onset: 62) in her mother; Diabetes in her brother and father; Heart disease in her father and  mother; Stroke in her brother.  Laboratory Chemistry Profile   Renal Lab Results  Component Value Date   BUN 18 02/26/2019   CREATININE 1.06 (H) 02/26/2019   GFRAA 57 (L) 02/26/2019   GFRNONAA 49 (L) 02/26/2019     Hepatic Lab Results  Component Value Date   AST 25 02/26/2019   ALT 14 02/26/2019   ALBUMIN 4.0 02/26/2019   ALKPHOS 92 02/26/2019     Electrolytes Lab Results  Component Value Date   NA 137 02/26/2019   K 3.7 02/26/2019   CL 103 02/26/2019   CALCIUM 9.5 02/26/2019   MG 2.2 07/14/2015     Bone No results found for: VD25OH, VD125OH2TOT, [id number], [id number], 25OHVITD1, 25OHVITD2, 25OHVITD3, TESTOFREE, TESTOSTERONE   Inflammation (CRP: Acute Phase) (ESR: Chronic Phase) No results found for: CRP, ESRSEDRATE, LATICACIDVEN     Note: Above Lab results reviewed.  Recent Imaging Review  MR LUMBAR SPINE WO CONTRAST CLINICAL DATA:  Low back pain with lumbar radiculopathy.  EXAM: MRI LUMBAR SPINE WITHOUT CONTRAST  TECHNIQUE: Multiplanar, multisequence MR imaging of the lumbar spine was performed. No intravenous contrast was administered.  COMPARISON:  MRI of the lumbar spine December 12, 2017  FINDINGS: Segmentation:  Standard.  Alignment: Levoconvex scoliosis of the lumbar spine. A grade 1 anterolisthesis of L4 over L5, unchanged. Small retrolisthesis of L2 over L3.  Vertebrae: No fracture or evidence of discitis. T1 hypointense/T2 hyperintense lesions are seen within the sacrum bilaterally on sagittal images, only partially evaluated. However, does appear stable since prior MRI. Endplate degenerative changes  throughout the lumbar spine, more pronounced at L1-2 and L3-4.  Conus medullaris and cauda equina: Conus extends to the T12-L1 level. Conus and cauda equina appear normal.  Paraspinal and other soft tissues: Bilateral renal cysts  Disc levels:  T11-12: Left central posterior disc osteophyte complex causing indentation of the thecal sac without significant spinal canal or neural foraminal stenosis.  T12-L1: Left asymmetric disc bulge and mild facet degenerative changes. No significant spinal canal or neural foraminal stenosis.  L1-2: Loss of disc height, disc bulge with associated osteophytic component and superimposed small central disc extrusion migrating inferiorly. There are also facet degenerative changes and ligamentum flavum redundancy, findings result in narrowing of the right subarticular zone, mild spinal canal stenosis and moderate bilateral neural foraminal narrowing.  L2-3: Loss of disc height with partial disc space fusion, right foraminal and extraforaminal disc protrusion with prominent osteophytic component and superimposed ossified right central disc extrusion. There are also facet degenerative changes and ligamentum flavum redundancy. Findings result in mild spinal canal stenosis with effacement of the right subarticular zone and severe right neural foraminal narrowing.  L3-4: Loss of disc height, right asymmetric disc bulge with associated osteophytic component and facet degenerative changes resulting in mild spinal canal stenosis with narrowing of the bilateral subarticular zones, moderate to severe right and moderate left neural foraminal narrowing.  L4-5: Disc bulge with superimposed right central disc protrusion migrating superiorly, prominent facet degenerative changes with ligamentum flavum redundancy as well as inter spinous degeneration. Findings result in severe spinal canal stenosis which appears mildly progressed from prior MRI and mild  bilateral neural foraminal narrowing.  L5-S1: Left asymmetric disc bulge, facet degenerative changes ligamentum flavum redundancy resulting in narrowing of the left subarticular zone and moderate left neural foraminal narrowing.  IMPRESSION: 1. Multilevel degenerative changes of the lumbar spine as described above, mildy progressed at L4-5 where there is severe spinal canal stenosis and mild bilateral neural foraminal narrowing.  2. Mild spinal canal stenosis at L1-2, L2-3, and L3-4. 3. Severe right neural foraminal narrowing at L2-3 and moderate to severe right neural foraminal narrowing at L3-4. 4. Moderate left neural foraminal narrowing at L5-S1. 5. Unchanged T1 hypointense/T2 hyperintense lesions within the sacrum bilaterally, only partially evaluated.  These results will be called to the ordering clinician or representative by the Radiologist Assistant, and communication documented in the PACS or [employer].  Electronically Signed   By: Pedro Earls M.D.   On: 09/25/2019 10:47 Note: Reviewed        Physical Exam  General appearance: Well nourished, well developed, and well hydrated. In no apparent acute distress Mental status: Alert, oriented x 3 (person, place, & time)       Respiratory: No evidence of acute respiratory distress Eyes: PERLA Vitals: BP 116/71   Pulse 68   Temp (!) 97.2 F (36.2 C) (Temporal)   Resp 16   Ht '5\' 4"'  (1.626 m)   Wt (!) 214 lb (97.1 kg)   SpO2 95%   BMI 36.73 kg/m  BMI: Estimated body mass index is 36.73 kg/m as calculated from the following:   Height as of this encounter: '5\' 4"'  (1.626 m).   Weight as of this encounter: 214 lb (97.1 kg). Ideal: Ideal body weight: 54.7 kg (120 lb 9.5 oz) Adjusted ideal body weight: 71.6 kg (157 lb 15.3 oz)  Assessment   Status Diagnosis  Controlled Controlled Controlled No diagnosis found.   Updated Problems: Problem  Chronic Pain Syndrome  Neuropathy   Chemotherapy-Induced Peripheral Neuropathy ([facility])  Neuropathy Associated With Cancer (Hcc)  Osteopenia  Arthritis, Degenerative    Plan of Care  Problem-specific:  No problem-specific Assessment & Plan notes found for this encounter.  Ms. JACK MINEAU has a current medication list which includes the following long-term medication(s): atorvastatin, eliquis, ezetimibe, fluticasone, isosorbide mononitrate, losartan, nitrostat, nortriptyline, and omeprazole.  Pharmacotherapy (Medications Ordered): No orders of the defined types were placed in this encounter.  Orders:  No orders of the defined types were placed in this encounter.  Follow-up plan:   Return in about 6 weeks (around 12/02/2019) for discuss MILD.     status post left L3, L4, L5 RFA on 01/16/2018. right L3, L4, L5 RFA: 04/14/2019; successful, 85% pain relief in lower lumbar region.      Recent Visits Date Type Provider Dept  09/04/19 Office Visit Gillis Santa, MD [facility]  Showing recent visits within past 90 days and meeting all other requirements Today's Visits Date Type Provider Dept  10/22/19 Office Visit Gillis Santa, MD [facility]  Showing today's visits and meeting all other requirements Future Appointments No visits were found meeting these conditions. Showing future appointments within next 90 days and meeting all other requirements  I discussed the assessment and treatment plan with the patient. The patient was provided an opportunity to ask questions and all were answered. The patient agreed with the plan and demonstrated an understanding of the instructions.  Patient advised to call back or seek an in-person evaluation if the symptoms or condition worsens.  Duration of encounter: *** minutes.  Note by: Gillis Santa, MD Date: 10/22/2019; Time: 1:32 PM

## 2019-10-23 ENCOUNTER — Ambulatory Visit: Payer: Medicare PPO

## 2019-10-23 DIAGNOSIS — M6281 Muscle weakness (generalized): Secondary | ICD-10-CM

## 2019-10-23 DIAGNOSIS — R2681 Unsteadiness on feet: Secondary | ICD-10-CM | POA: Diagnosis not present

## 2019-10-23 NOTE — Therapy (Signed)
[place] MAIN [facility] SERVICES [address] [place], [place], [address] Phone: [phone]   Fax:  [phone]  Physical Therapy Treatment  Patient Details  Name: Susan [name] MRN: [id number] Date of Birth: 10-17-1934 Referring Provider (PT): Dr. Melrose Nakayama   Encounter Date: 10/23/2019   PT End of Session - 10/23/19 1656    Visit Number 31    Number of Visits 32    Date for PT Re-Evaluation 11/11/19    Authorization Type eval 06/24/19, Progress note: 10/21/19    PT Start Time 1515    PT Stop Time 1600    PT Time Calculation (min) 45 min    Equipment Utilized During Treatment Gait belt    Activity Tolerance Patient tolerated treatment well;Patient limited by pain    Behavior During Therapy [facility] for tasks assessed/performed           Past Medical History:  Diagnosis Date  . Bradycardia   . Chronic back pain   . Coronary artery disease    a. s/p STEMI in 2014 with DES to LAD with staged PCI/DES placement to LCx  . Echoencephalogram abnormality   . Heart murmur   . History of Holter monitoring   . Hyperlipidemia   . Hypertension   . Myocardial infarction ([place])   . Neuromuscular disorder ([facility])    neuropathy  . NSVT (nonsustained ventricular tachycardia) ([place])   . Personal history of chemotherapy    ENDOMETRIAL CA  . Personal history of radiation therapy    ENDOMETRIAL CA  . Uterine cancer ([place])    serrous    Past Surgical History:  Procedure Laterality Date  . ABDOMINAL HYSTERECTOMY    . BREAST EXCISIONAL BIOPSY Right 1956   NEG  . CARDIOVERSION N/A 07/27/2017   Procedure: CARDIOVERSION;  Surgeon: Skeet Latch, MD;  Location: [place];  Service: Cardiovascular;  Laterality: N/A;  . LEFT HEART CATHETERIZATION WITH CORONARY ANGIOGRAM  10/19/2012   Procedure: LEFT HEART CATHETERIZATION WITH CORONARY ANGIOGRAM;  Surgeon: Troy Sine, MD;  Location: [facility] CATH LAB;  Service: Cardiovascular;;  . PERCUTANEOUS CORONARY STENT  INTERVENTION (PCI-S)  10/19/2012   Procedure: PERCUTANEOUS CORONARY STENT INTERVENTION (PCI-S);  Surgeon: Troy Sine, MD;  Location: [facility] CATH LAB;  Service: Cardiovascular;;  . PERCUTANEOUS CORONARY STENT INTERVENTION (PCI-S) N/A 10/23/2012   Procedure: PERCUTANEOUS CORONARY STENT INTERVENTION (PCI-S);  Surgeon: Troy Sine, MD;  Location: [facility] CATH LAB;  Service: Cardiovascular;  Laterality: N/A;  . PERIPHERAL VASCULAR CATHETERIZATION N/A 05/05/2015   Procedure: Glori Luis Cath Insertion;  Surgeon: Algernon Huxley, MD;  Location: [place] CV LAB;  Service: Cardiovascular;  Laterality: N/A;  . PORTA CATH REMOVAL N/A 04/05/2017   Procedure: PORTA CATH REMOVAL;  Surgeon: Algernon Huxley, MD;  Location: [place] CV LAB;  Service: Cardiovascular;  Laterality: N/A;    There were no vitals filed for this visit.   Subjective Assessment - 10/23/19 1522    Subjective Pt reports that she is feeling okay today. She went to see her pain management doctor yesterday and he discussed a minimially invasive procedure to decompress the spinal canal by resecting part of the ligamentum flavum. Patient's back pain is a 5/10 today. No specific questions or concerns at this time.    Pertinent History Pt reports imbalance related to chemo-induced neuropathy which started 4 years ago. Neuropathy occurs in stocking distribution from knees to feet. Symptoms have been unchanged since that time. She has had one fall in the last 6  months. Denies dizziness or vertigo. No heachaches/migraines. She has a history of chronic back pain and has had a bilateral nerve ablation. Her husband passed away 4 weeks ago after contracting COVID.    Limitations Standing;Walking    How long can you stand comfortably? 5 minutes, (unchanges since last visit) limited due to back pain    How long can you walk comfortably? 5-10 minutes, (unchanges since last visit) limited due to back pain    Patient Stated Goals Strengthen her back muscles to decrease  pain and improve her balance    Currently in Pain? Yes    Pain Score 5     Pain Location Back    Pain Orientation Lower    Pain Descriptors / Indicators Aching    Pain Type Chronic pain    Pain Onset More than a month ago    Pain Frequency Intermittent            TREATMENT   Therapeutic Exercises Octane L4, seat height 8, x 5 minutes for warm-up during history (1 minutes unbilled);  Hip exercises using 4# AW: UE support Hip abductions x 12 on each leg Marches x 20 each leg; Hip flexion/extensions straight leg x 20 each; Sitting LAQ x 20 each leg;  Standing lunges on bosu ball x 10 each leg   Neuromuscular Re-education  Step over foam roller throw rainbow ball, 10x each side; more challenging stepping backwards with occasional LOB, SPT performing CGA with PT providing ball toss;  Red mat with uneven surface walks: no UE support Forward and backwards x 4 lengths  Side steps x 2 lengths     Pt educated throughout session about proper posture and technique with exercises. Improved exercise technique, movement at target joints, use of target muscles after min to mod verbal, visual, tactile cues.      Pt demonstrates excellent motivation during session today. Session focused on a combination of strength and balance today. Practiced walking on uneven surface to simulate walking on grass. Patient requires frequent rest breaks due to fatigue. Pt encouraged to follow-up as scheduled. She will benefit from PT services to address deficits in dizziness, balance, and mobility in order to return to full function at home.           PT Short Term Goals - 07/29/19 1452      PT SHORT TERM GOAL #1   Title Pt will be independent with home exercise program in order to improve balance and leg strength to decrease fall risk and improve function at home    Baseline 07/29/19: Pt reports that she performs "every other day"    Time 6    Period Weeks    Status On-going    Target Date  08/06/19             PT Long Term Goals - 10/21/19 1440      PT LONG TERM GOAL #1   Title Pt will improve BERG to at least 50/56 in order to demonstrate clinically significant improvement in balance.    Baseline 06/24/19: 43/56; 07/29/19: 47/56; 6/22: 47/56 (unchanged); 7/27: 50/56    Time 12    Period Weeks    Status Achieved      PT LONG TERM GOAL #2   Title Pt will improve ABC by at least 13% in order to demonstrate clinically significant improvement in balance confidence.    Baseline 06/24/19: 35%; 07/29/19: 55.625%    Time 12    Period Weeks  Status Achieved      PT LONG TERM GOAL #3   Title Pt will increase self-selected 10MWT by at least 0.13 m/s in order to demonstrate clinically significant improvement in community ambulation.    Baseline 06/24/19: 14.3s = 0.70 m/s; 07/29/19: 11.5s = 0.87 m/s; on 6/22: 1.77m/s    Time 12    Period Weeks    Status Achieved      PT LONG TERM GOAL #4   Title Pt will improve ABC to greater than 67% in order to demonstrate clinically significant improvement in balance confidence.    Baseline 07/29/19: 55.625%; 7/27: 77.5%    Time 12    Period Weeks    Status Achieved      PT LONG TERM GOAL #5   Title pt will be able to demonstrate proper lifting technique 3/5 times in order to demonstrate improved balance and decrease risk fo falls.     Baseline 5/5 on 12/16/15    Time 4    Period Weeks    Status Achieved      PT LONG TERM GOAL #6   Title Pt will tolerated >1237ft in 6MWT to improve tolerance to AMB in the community for IADL performance.    Baseline 6/22: Unable to tolerate more than 5 minutes AMB due to back pain.; 7/27: 815 feet, last 2 minutes used cane due to back pain    Time 8    Period Weeks    Status On-going    Target Date 11/11/19                 Plan - 10/23/19 1655    Clinical Impression Statement Pt demonstrates excellent motivation during session today. Session focused on a combination of strength and balance  today. Practiced walking on uneven surface to simulate walking on grass. Patient requires frequent rest breaks due to fatigue. Pt encouraged to follow-up as scheduled. She will benefit from PT services to address deficits in dizziness, balance, and mobility in order to return to full function at home.    Personal Factors and Comorbidities Age;Comorbidity 3+;Past/Current Experience;Time since onset of injury/illness/exacerbation    Comorbidities Neuropathy, chronic back pain, MI, OA, CAD, HTN    Examination-Activity Limitations Bend;Locomotion Level;Lift;Stairs;Stand    Examination-Participation Restrictions Community Activity;Laundry;Yard Work    Stability/Clinical Decision Making Unstable/Unpredictable    Rehab Potential Fair    PT Frequency 2x / week    PT Duration 12 weeks    PT Treatment/Interventions ADLs/Self Care Home Management;Aquatic Therapy;Biofeedback;Canalith Repostioning;Cryotherapy;Electrical Stimulation;Iontophoresis 4mg /ml Dexamethasone;Moist Heat;Traction;Ultrasound;DME Instruction;Gait training;Stair training;Functional mobility training;Therapeutic activities;Therapeutic exercise;Balance training;Neuromuscular re-education;Cognitive remediation;Patient/family education;Manual techniques;Passive range of motion;Dry needling;Vestibular;Spinal Manipulations;Joint Manipulations    PT Next Visit Plan Update Continue with strength and balance exercises during session. Especially eccentric quad control.    PT Home Exercise Plan Medbridge Access Code: [email or web address]    Consulted and Agree with Plan of Care Patient           Patient will benefit from skilled therapeutic intervention in order to improve the following deficits and impairments:  Abnormal gait, Decreased activity tolerance, Decreased balance, Decreased mobility, Decreased strength, Difficulty walking, Impaired sensation, Pain  Visit Diagnosis: Unsteadiness on feet  Muscle weakness (generalized)     Problem List Patient  Active Problem List   Diagnosis Date Noted  . Lumbar degenerative disc disease 10/22/2019  . Lumbar facet arthropathy 09/04/2019  . Chronic pain syndrome 09/04/2019  . Chemotherapy-induced neuropathy ([place]) 04/17/2018  . Neuropathy 04/17/2018  . Chemotherapy-induced peripheral neuropathy ([place]) 03/01/2018  .  Persistent atrial fibrillation ([place])   . Port-A-Cath in place 03/28/2017  . Bradycardia 01/18/2017  . Obesity (BMI 30-39.9) 01/17/2017  . Pulmonary nodules 12/05/2016  . Compression fracture of L5 vertebra with delayed healing 11/01/2016  . Sacral insufficiency fracture with delayed healing 11/01/2016  . Sacral insufficiency fracture 09/05/2016  . Absence of bladder continence 07/21/2015  . Neuropathy associated with cancer ([place]) 06/02/2015  . Carcinoma of endometrium ([place]) 04/02/2015  . Gastro-esophageal reflux disease without esophagitis 02/15/2015  . Osteopenia 08/17/2014  . Hyperlipidemia 02/13/2014  . Kidney stones 10/27/2013  . CA skin, basal cell 06/30/2013  . Spinal stenosis, lumbar region, with neurogenic claudication 06/30/2013  . Arthritis, degenerative 06/30/2013  . Neuralgia neuritis, sciatic nerve 06/30/2013  . Coronary artery disease 10/25/2012  . Dyslipidemia 10/25/2012  . NSVT (nonsustained ventricular tachycardia) ([place]) 10/25/2012  . Sick sinus syndrome ([place]) 10/25/2012  . Ischemic cardiomyopathy 10/22/2012  . Old anterior wall myocardial infarction 10/20/2012  . Hypertensive heart disease without CHF 10/20/2012   This entire session was performed under direct supervision and direction of a licensed therapist/therapist assistant . I have personally read, edited and approve of the note as written.   Noemi Chapel, SPT Phillips Grout PT, DPT, GCS  Huprich,Jason 10/24/2019, 9:06 AM  [place] MAIN [facility] SERVICES [address] [place], [place], [address] Phone: [phone]   Fax:  [phone]  Name: Susan [name] MRN: [id number] Date of Birth: 1935/03/10

## 2019-10-28 ENCOUNTER — Ambulatory Visit: Payer: Medicare PPO | Attending: Neurology

## 2019-10-28 ENCOUNTER — Other Ambulatory Visit: Payer: Self-pay

## 2019-10-28 DIAGNOSIS — M6281 Muscle weakness (generalized): Secondary | ICD-10-CM | POA: Diagnosis present

## 2019-10-28 DIAGNOSIS — R2681 Unsteadiness on feet: Secondary | ICD-10-CM | POA: Diagnosis not present

## 2019-10-28 NOTE — Therapy (Signed)
[place] MAIN [facility] SERVICES [address] [place], [place], [address] Phone: [phone]   Fax:  [phone]  Physical Therapy Treatment  Patient Details  Name: Susan [name] MRN: [id number] Date of Birth: Feb 24, 1935 Referring Provider (PT): Dr. Melrose Nakayama   Encounter Date: 10/28/2019   PT End of Session - 10/28/19 1349    Visit Number 32    Number of Visits 56    Date for PT Re-Evaluation 11/11/19    Authorization Type eval 06/24/19, Progress note: 10/21/19    PT Start Time 1344    PT Stop Time 1422    PT Time Calculation (min) 38 min    Equipment Utilized During Treatment Gait belt    Activity Tolerance Patient tolerated treatment well;Patient limited by pain    Behavior During Therapy [facility] for tasks assessed/performed           Past Medical History:  Diagnosis Date  . Bradycardia   . Chronic back pain   . Coronary artery disease    a. s/p STEMI in 2014 with DES to LAD with staged PCI/DES placement to LCx  . Echoencephalogram abnormality   . Heart murmur   . History of Holter monitoring   . Hyperlipidemia   . Hypertension   . Myocardial infarction ([place])   . Neuromuscular disorder (HCC)    neuropathy  . NSVT (nonsustained ventricular tachycardia) ([place])   . Personal history of chemotherapy    ENDOMETRIAL CA  . Personal history of radiation therapy    ENDOMETRIAL CA  . Uterine cancer ([place])    serrous    Past Surgical History:  Procedure Laterality Date  . ABDOMINAL HYSTERECTOMY    . BREAST EXCISIONAL BIOPSY Right 1956   NEG  . CARDIOVERSION N/A 07/27/2017   Procedure: CARDIOVERSION;  Surgeon: Skeet Latch, MD;  Location: [place];  Service: Cardiovascular;  Laterality: N/A;  . LEFT HEART CATHETERIZATION WITH CORONARY ANGIOGRAM  10/19/2012   Procedure: LEFT HEART CATHETERIZATION WITH CORONARY ANGIOGRAM;  Surgeon: Troy Sine, MD;  Location: [facility] CATH LAB;  Service: Cardiovascular;;  . PERCUTANEOUS CORONARY STENT  INTERVENTION (PCI-S)  10/19/2012   Procedure: PERCUTANEOUS CORONARY STENT INTERVENTION (PCI-S);  Surgeon: Troy Sine, MD;  Location: [facility] CATH LAB;  Service: Cardiovascular;;  . PERCUTANEOUS CORONARY STENT INTERVENTION (PCI-S) N/A 10/23/2012   Procedure: PERCUTANEOUS CORONARY STENT INTERVENTION (PCI-S);  Surgeon: Troy Sine, MD;  Location: [facility] CATH LAB;  Service: Cardiovascular;  Laterality: N/A;  . PERIPHERAL VASCULAR CATHETERIZATION N/A 05/05/2015   Procedure: Glori Luis Cath Insertion;  Surgeon: Algernon Huxley, MD;  Location: [place] CV LAB;  Service: Cardiovascular;  Laterality: N/A;  . PORTA CATH REMOVAL N/A 04/05/2017   Procedure: PORTA CATH REMOVAL;  Surgeon: Algernon Huxley, MD;  Location: [place] CV LAB;  Service: Cardiovascular;  Laterality: N/A;    There were no vitals filed for this visit.   Subjective Assessment - 10/28/19 1347    Subjective Pt reports that she is feeling okay today. Patient's back pain is a 4/10 today, after taking 2 pills before coming. No specific questions or concerns at this time.    Pertinent History Pt reports imbalance related to chemo-induced neuropathy which started 4 years ago. Neuropathy occurs in stocking distribution from knees to feet. Symptoms have been unchanged since that time. She has had one fall in the last 6 months. Denies dizziness or vertigo. No heachaches/migraines. She has a history of chronic back pain and has had a bilateral nerve ablation.  Her husband passed away 4 weeks ago after contracting COVID.    Limitations Standing;Walking    How long can you stand comfortably? 5 minutes, (unchanges since last visit) limited due to back pain    How long can you walk comfortably? 5-10 minutes, (unchanges since last visit) limited due to back pain    Patient Stated Goals Strengthen her back muscles to decrease pain and improve her balance    Currently in Pain? Yes    Pain Score 4     Pain Location Back    Pain Orientation Lower    Pain  Descriptors / Indicators Aching    Pain Type Chronic pain    Pain Onset More than a month ago             TREATMENT   Therapeutic Exercises Octane L4, seat height 8, x 5 minutes for warm-up during history;  Hip exercises using 4# AW: UE support Hip abductions x 12 on each leg Marches x 10 each leg; Hip extensions straight leg x 12 each; Sitting LAQ x 20 each leg; Heel toe raises x 12    Neuromuscular Re-education  Standing on airex with alternating taps on 6" step with foam pad x 10 each leg, no cues utilized as she was able to clear the surface; Step over foam roller throw basketball, 10x each side; more challenging stepping backwards with occasional LOB, SPT performing CGA with PT providing ball toss, cued to slow down steps to focus on balance; Airex pad with basketball reaches and tosses x multiple bouts, reaching across the body to get the ball and reaching towards the basketball.  Red mat with uneven surface walks: no UE support Forward and backwards x 3 lengths, eyes closed x 2 lengths Side steps x 2 lengths, eyes closed x 2 lengths     Pt educated throughout session about proper posture and technique with exercises. Improved exercise technique, movement at target joints, use of target muscles after min to mod verbal, visual, tactile cues.      Pt demonstrates excellent motivation today. Patient had to leave early due to an appointment, so session was slightly abbreviated. Session focused on a combination of strength and balance today. Practiced walking forwards/backwards and side to side eyes open and closed on uneven surface to challenge balance. Patient requires frequent rest breaks due to fatigue. Pt encouraged to follow-up as scheduled. She will benefit from PT services to address deficits in dizziness, balance, and mobility in order to return to full function at home.           PT Short Term Goals - 07/29/19 1452      PT SHORT TERM GOAL #1   Title Pt will  be independent with home exercise program in order to improve balance and leg strength to decrease fall risk and improve function at home    Baseline 07/29/19: Pt reports that she performs "every other day"    Time 6    Period Weeks    Status On-going    Target Date 08/06/19             PT Long Term Goals - 10/21/19 1440      PT LONG TERM GOAL #1   Title Pt will improve BERG to at least 50/56 in order to demonstrate clinically significant improvement in balance.    Baseline 06/24/19: 43/56; 07/29/19: 47/56; 6/22: 47/56 (unchanged); 7/27: 50/56    Time 12    Period Weeks    Status Achieved  PT LONG TERM GOAL #2   Title Pt will improve ABC by at least 13% in order to demonstrate clinically significant improvement in balance confidence.    Baseline 06/24/19: 35%; 07/29/19: 55.625%    Time 12    Period Weeks    Status Achieved      PT LONG TERM GOAL #3   Title Pt will increase self-selected 10MWT by at least 0.13 m/s in order to demonstrate clinically significant improvement in community ambulation.    Baseline 06/24/19: 14.3s = 0.70 m/s; 07/29/19: 11.5s = 0.87 m/s; on 6/22: 1.57m/s    Time 12    Period Weeks    Status Achieved      PT LONG TERM GOAL #4   Title Pt will improve ABC to greater than 67% in order to demonstrate clinically significant improvement in balance confidence.    Baseline 07/29/19: 55.625%; 7/27: 77.5%    Time 12    Period Weeks    Status Achieved      PT LONG TERM GOAL #5   Title pt will be able to demonstrate proper lifting technique 3/5 times in order to demonstrate improved balance and decrease risk fo falls.     Baseline 5/5 on 12/16/15    Time 4    Period Weeks    Status Achieved      PT LONG TERM GOAL #6   Title Pt will tolerated >1292ft in 6MWT to improve tolerance to AMB in the community for IADL performance.    Baseline 6/22: Unable to tolerate more than 5 minutes AMB due to back pain.; 7/27: 815 feet, last 2 minutes used cane due to back pain     Time 8    Period Weeks    Status On-going    Target Date 11/11/19                 Plan - 10/28/19 1350    Clinical Impression Statement Pt demonstrates excellent motivation today. Patient had to leave early due to an appointment, so session was slightly abbreviated. Session focused on a combination of strength and balance today. Practiced walking forwards/backwards and side to side eyes open and closed on uneven surface to challenge balance. Patient requires frequent rest breaks due to fatigue. Pt encouraged to follow-up as scheduled. She will benefit from PT services to address deficits in dizziness, balance, and mobility in order to return to full function at home.    Personal Factors and Comorbidities Age;Comorbidity 3+;Past/Current Experience;Time since onset of injury/illness/exacerbation    Comorbidities Neuropathy, chronic back pain, MI, OA, CAD, HTN    Examination-Activity Limitations Bend;Locomotion Level;Lift;Stairs;Stand    Examination-Participation Restrictions Community Activity;Laundry;Yard Work    Stability/Clinical Decision Making Unstable/Unpredictable    Rehab Potential Fair    PT Frequency 2x / week    PT Duration 12 weeks    PT Treatment/Interventions ADLs/Self Care Home Management;Aquatic Therapy;Biofeedback;Canalith Repostioning;Cryotherapy;Electrical Stimulation;Iontophoresis 4mg /ml Dexamethasone;Moist Heat;Traction;Ultrasound;DME Instruction;Gait training;Stair training;Functional mobility training;Therapeutic activities;Therapeutic exercise;Balance training;Neuromuscular re-education;Cognitive remediation;Patient/family education;Manual techniques;Passive range of motion;Dry needling;Vestibular;Spinal Manipulations;Joint Manipulations    PT Next Visit Plan Update Continue with strength and balance exercises during session. Especially eccentric quad control.    PT Home Exercise Plan Medbridge Access Code: [email or web address]    Consulted and Agree with Plan of Care Patient            Patient will benefit from skilled therapeutic intervention in order to improve the following deficits and impairments:  Abnormal gait, Decreased activity tolerance, Decreased balance, Decreased mobility, Decreased strength, Difficulty  walking, Impaired sensation, Pain  Visit Diagnosis: Unsteadiness on feet  Muscle weakness (generalized)     Problem List Patient Active Problem List   Diagnosis Date Noted  . Lumbar degenerative disc disease 10/22/2019  . Lumbar facet arthropathy 09/04/2019  . Chronic pain syndrome 09/04/2019  . Chemotherapy-induced neuropathy ([place]) 04/17/2018  . Neuropathy 04/17/2018  . Chemotherapy-induced peripheral neuropathy ([place]) 03/01/2018  . Persistent atrial fibrillation ([place])   . Port-A-Cath in place 03/28/2017  . Bradycardia 01/18/2017  . Obesity (BMI 30-39.9) 01/17/2017  . Pulmonary nodules 12/05/2016  . Compression fracture of L5 vertebra with delayed healing 11/01/2016  . Sacral insufficiency fracture with delayed healing 11/01/2016  . Sacral insufficiency fracture 09/05/2016  . Absence of bladder continence 07/21/2015  . Neuropathy associated with cancer ([place]) 06/02/2015  . Carcinoma of endometrium ([place]) 04/02/2015  . Gastro-esophageal reflux disease without esophagitis 02/15/2015  . Osteopenia 08/17/2014  . Hyperlipidemia 02/13/2014  . Kidney stones 10/27/2013  . CA skin, basal cell 06/30/2013  . Spinal stenosis, lumbar region, with neurogenic claudication 06/30/2013  . Arthritis, degenerative 06/30/2013  . Neuralgia neuritis, sciatic nerve 06/30/2013  . Coronary artery disease 10/25/2012  . Dyslipidemia 10/25/2012  . NSVT (nonsustained ventricular tachycardia) ([place]) 10/25/2012  . Sick sinus syndrome ([place]) 10/25/2012  . Ischemic cardiomyopathy 10/22/2012  . Old anterior wall myocardial infarction 10/20/2012  . Hypertensive heart disease without CHF 10/20/2012    This entire session was performed under direct supervision and  direction of a licensed therapist/therapist assistant . I have personally read, edited and approve of the note as written.   Noemi Chapel, SPT Phillips Grout PT, DPT, GCS  Huprich,Jason 10/28/2019, 5:29 PM  [place] MAIN [facility] SERVICES [address] [place], [place], [address] Phone: [phone]   Fax:  [phone]  Name: Susan [name] MRN: [id number] Date of Birth: 01/05/1935

## 2019-10-30 ENCOUNTER — Ambulatory Visit: Payer: Medicare PPO

## 2019-10-30 ENCOUNTER — Other Ambulatory Visit: Payer: Self-pay

## 2019-10-30 DIAGNOSIS — R2681 Unsteadiness on feet: Secondary | ICD-10-CM

## 2019-10-30 DIAGNOSIS — M6281 Muscle weakness (generalized): Secondary | ICD-10-CM

## 2019-10-30 NOTE — Therapy (Signed)
[place] MAIN [facility] SERVICES [address] [place], [place], [address] Phone: [phone]   Fax:  [phone]  Physical Therapy Treatment  Patient Details  Name: Susan [name] MRN: [id number] Date of Birth: October 03, 1934 Referring Provider (PT): Dr. Melrose Nakayama   Encounter Date: 10/30/2019   PT End of Session - 10/30/19 1431    Visit Number 33    Number of Visits 56    Date for PT Re-Evaluation 11/11/19    Authorization Type eval 06/24/19, Progress note: 10/21/19    PT Start Time 1429    PT Stop Time 1513    PT Time Calculation (min) 44 min    Equipment Utilized During Treatment Gait belt    Activity Tolerance Patient tolerated treatment well;Patient limited by pain    Behavior During Therapy [facility] for tasks assessed/performed           Past Medical History:  Diagnosis Date  . Bradycardia   . Chronic back pain   . Coronary artery disease    a. s/p STEMI in 2014 with DES to LAD with staged PCI/DES placement to LCx  . Echoencephalogram abnormality   . Heart murmur   . History of Holter monitoring   . Hyperlipidemia   . Hypertension   . Myocardial infarction ([place])   . Neuromuscular disorder (HCC)    neuropathy  . NSVT (nonsustained ventricular tachycardia) ([place])   . Personal history of chemotherapy    ENDOMETRIAL CA  . Personal history of radiation therapy    ENDOMETRIAL CA  . Uterine cancer ([place])    serrous    Past Surgical History:  Procedure Laterality Date  . ABDOMINAL HYSTERECTOMY    . BREAST EXCISIONAL BIOPSY Right 1956   NEG  . CARDIOVERSION N/A 07/27/2017   Procedure: CARDIOVERSION;  Surgeon: Skeet Latch, MD;  Location: [place];  Service: Cardiovascular;  Laterality: N/A;  . LEFT HEART CATHETERIZATION WITH CORONARY ANGIOGRAM  10/19/2012   Procedure: LEFT HEART CATHETERIZATION WITH CORONARY ANGIOGRAM;  Surgeon: Troy Sine, MD;  Location: [facility] CATH LAB;  Service: Cardiovascular;;  . PERCUTANEOUS CORONARY STENT  INTERVENTION (PCI-S)  10/19/2012   Procedure: PERCUTANEOUS CORONARY STENT INTERVENTION (PCI-S);  Surgeon: Troy Sine, MD;  Location: [facility] CATH LAB;  Service: Cardiovascular;;  . PERCUTANEOUS CORONARY STENT INTERVENTION (PCI-S) N/A 10/23/2012   Procedure: PERCUTANEOUS CORONARY STENT INTERVENTION (PCI-S);  Surgeon: Troy Sine, MD;  Location: [facility] CATH LAB;  Service: Cardiovascular;  Laterality: N/A;  . PERIPHERAL VASCULAR CATHETERIZATION N/A 05/05/2015   Procedure: Glori Luis Cath Insertion;  Surgeon: Algernon Huxley, MD;  Location: [place] CV LAB;  Service: Cardiovascular;  Laterality: N/A;  . PORTA CATH REMOVAL N/A 04/05/2017   Procedure: PORTA CATH REMOVAL;  Surgeon: Algernon Huxley, MD;  Location: [place] CV LAB;  Service: Cardiovascular;  Laterality: N/A;    There were no vitals filed for this visit.   Subjective Assessment - 10/30/19 1426    Subjective Pt reports that she is feeling okay today. Patient's back pain is a 5/10 today, after taking 2 pills before coming. No specific questions or concerns at this time.    Pertinent History Pt reports imbalance related to chemo-induced neuropathy which started 4 years ago. Neuropathy occurs in stocking distribution from knees to feet. Symptoms have been unchanged since that time. She has had one fall in the last 6 months. Denies dizziness or vertigo. No heachaches/migraines. She has a history of chronic back pain and has had a bilateral nerve ablation.  Her husband passed away 4 weeks ago after contracting COVID.    Limitations Standing;Walking    How long can you stand comfortably? 5 minutes, (unchanges since last visit) limited due to back pain    How long can you walk comfortably? 5-10 minutes, (unchanges since last visit) limited due to back pain    Patient Stated Goals Strengthen her back muscles to decrease pain and improve her balance    Currently in Pain? Yes    Pain Score 5     Pain Location Back    Pain Orientation Lower    Pain  Descriptors / Indicators Aching    Pain Onset More than a month ago             TREATMENT    Therapeutic Exercises Octane L4, seat height 8, x 5 minutes for warm-up during history (unbilled 2 minutes);   Hip exercises using 4# AW: UE support used Hip abductions x 20 on each leg Marches x 20 each leg; Hip extensions straight leg x 20 each; Heel toe raises x 20     Neuromuscular Re-education  Kicking foam ball off cone x 10 each leg, progressed to kicking tennis ball off cone x 20 each leg, cues needed for no UE support;  Red mat with uneven surface walks: no UE support Forward and backwards x 2 lengths, eyes closed x 3 lengths Side steps x 2 lengths, eyes closed x 3 lengths [place] walks x 2 lengths, very difficult on mat  Sitting exercises on physio ball to help with core stability. Marches x 20 total; Cone taps x 20 total; LAQ x 20 total; 2 kg ball passes, cued for feet together x multiple bouts; 2 kg straight arm overhead ball lifts x 10; Abduction with GTB; Adduction with ball squeezes.       Pt educated throughout session about proper posture and technique with exercises. Improved exercise technique, movement at target joints, use of target muscles after min to mod verbal, visual, tactile cues.      Pt demonstrates excellent motivation today. Practiced walking forwards/backwards and side to side eyes open and closed on uneven surface to challenge balance. Added dynamic core balance activities with kicking the ball off the cone and stability ball exercises. Pt encouraged to follow-up as scheduled. She will benefit from PT services to address deficits in dizziness, balance, and mobility in order to return to full function at home.          PT Short Term Goals - 07/29/19 1452      PT SHORT TERM GOAL #1   Title Pt will be independent with home exercise program in order to improve balance and leg strength to decrease fall risk and improve function at home     Baseline 07/29/19: Pt reports that she performs "every other day"    Time 6    Period Weeks    Status On-going    Target Date 08/06/19             PT Long Term Goals - 10/21/19 1440      PT LONG TERM GOAL #1   Title Pt will improve BERG to at least 50/56 in order to demonstrate clinically significant improvement in balance.    Baseline 06/24/19: 43/56; 07/29/19: 47/56; 6/22: 47/56 (unchanged); 7/27: 50/56    Time 12    Period Weeks    Status Achieved      PT LONG TERM GOAL #2   Title Pt will improve ABC by  at least 13% in order to demonstrate clinically significant improvement in balance confidence.    Baseline 06/24/19: 35%; 07/29/19: 55.625%    Time 12    Period Weeks    Status Achieved      PT LONG TERM GOAL #3   Title Pt will increase self-selected 10MWT by at least 0.13 m/s in order to demonstrate clinically significant improvement in community ambulation.    Baseline 06/24/19: 14.3s = 0.70 m/s; 07/29/19: 11.5s = 0.87 m/s; on 6/22: 1.53m/s    Time 12    Period Weeks    Status Achieved      PT LONG TERM GOAL #4   Title Pt will improve ABC to greater than 67% in order to demonstrate clinically significant improvement in balance confidence.    Baseline 07/29/19: 55.625%; 7/27: 77.5%    Time 12    Period Weeks    Status Achieved      PT LONG TERM GOAL #5   Title pt will be able to demonstrate proper lifting technique 3/5 times in order to demonstrate improved balance and decrease risk fo falls.     Baseline 5/5 on 12/16/15    Time 4    Period Weeks    Status Achieved      PT LONG TERM GOAL #6   Title Pt will tolerated >1222ft in 6MWT to improve tolerance to AMB in the community for IADL performance.    Baseline 6/22: Unable to tolerate more than 5 minutes AMB due to back pain.; 7/27: 815 feet, last 2 minutes used cane due to back pain    Time 8    Period Weeks    Status On-going    Target Date 11/11/19                 Plan - 10/30/19 1432    Clinical Impression  Statement Pt demonstrates excellent motivation today. Practiced walking forwards/backwards and side to side eyes open and closed on uneven surface to challenge balance. Added dynamic core balance activities with kicking the ball off the cone and stability ball exercises. Pt encouraged to follow-up as scheduled. She will benefit from PT services to address deficits in dizziness, balance, and mobility in order to return to full function at home.    Personal Factors and Comorbidities Age;Comorbidity 3+;Past/Current Experience;Time since onset of injury/illness/exacerbation    Comorbidities Neuropathy, chronic back pain, MI, OA, CAD, HTN    Examination-Activity Limitations Bend;Locomotion Level;Lift;Stairs;Stand    Examination-Participation Restrictions Community Activity;Laundry;Yard Work    Stability/Clinical Decision Making Unstable/Unpredictable    Rehab Potential Fair    PT Frequency 2x / week    PT Duration 12 weeks    PT Treatment/Interventions ADLs/Self Care Home Management;Aquatic Therapy;Biofeedback;Canalith Repostioning;Cryotherapy;Electrical Stimulation;Iontophoresis 4mg /ml Dexamethasone;Moist Heat;Traction;Ultrasound;DME Instruction;Gait training;Stair training;Functional mobility training;Therapeutic activities;Therapeutic exercise;Balance training;Neuromuscular re-education;Cognitive remediation;Patient/family education;Manual techniques;Passive range of motion;Dry needling;Vestibular;Spinal Manipulations;Joint Manipulations    PT Next Visit Plan Update Continue with strength and balance exercises during session. Especially eccentric quad control.    PT Home Exercise Plan Medbridge Access Code: [email or web address]    Consulted and Agree with Plan of Care Patient           Patient will benefit from skilled therapeutic intervention in order to improve the following deficits and impairments:  Abnormal gait, Decreased activity tolerance, Decreased balance, Decreased mobility, Decreased strength,  Difficulty walking, Impaired sensation, Pain  Visit Diagnosis: Unsteadiness on feet  Muscle weakness (generalized)     Problem List Patient Active Problem List   Diagnosis Date Noted  .  Lumbar degenerative disc disease 10/22/2019  . Lumbar facet arthropathy 09/04/2019  . Chronic pain syndrome 09/04/2019  . Chemotherapy-induced neuropathy ([place]) 04/17/2018  . Neuropathy 04/17/2018  . Chemotherapy-induced peripheral neuropathy ([place]) 03/01/2018  . Persistent atrial fibrillation ([place])   . Port-A-Cath in place 03/28/2017  . Bradycardia 01/18/2017  . Obesity (BMI 30-39.9) 01/17/2017  . Pulmonary nodules 12/05/2016  . Compression fracture of L5 vertebra with delayed healing 11/01/2016  . Sacral insufficiency fracture with delayed healing 11/01/2016  . Sacral insufficiency fracture 09/05/2016  . Absence of bladder continence 07/21/2015  . Neuropathy associated with cancer ([place]) 06/02/2015  . Carcinoma of endometrium ([place]) 04/02/2015  . Gastro-esophageal reflux disease without esophagitis 02/15/2015  . Osteopenia 08/17/2014  . Hyperlipidemia 02/13/2014  . Kidney stones 10/27/2013  . CA skin, basal cell 06/30/2013  . Spinal stenosis, lumbar region, with neurogenic claudication 06/30/2013  . Arthritis, degenerative 06/30/2013  . Neuralgia neuritis, sciatic nerve 06/30/2013  . Coronary artery disease 10/25/2012  . Dyslipidemia 10/25/2012  . NSVT (nonsustained ventricular tachycardia) ([place]) 10/25/2012  . Sick sinus syndrome ([place]) 10/25/2012  . Ischemic cardiomyopathy 10/22/2012  . Old anterior wall myocardial infarction 10/20/2012  . Hypertensive heart disease without CHF 10/20/2012    This entire session was performed under direct supervision and direction of a licensed therapist/therapist assistant . I have personally read, edited and approve of the note as written.   Noemi Chapel, SPT Phillips Grout PT, DPT, GCS  Huprich,Jason 10/31/2019, 9:48 AM  [place] MAIN [facility] SERVICES [address] [place], [place], [address] Phone: [phone]   Fax:  [phone]  Name: Susan [name] MRN: [id number] Date of Birth: [birth year]

## 2019-11-03 ENCOUNTER — Encounter: Payer: Self-pay | Admitting: Urology

## 2019-11-03 ENCOUNTER — Ambulatory Visit: Payer: Medicare PPO | Admitting: Urology

## 2019-11-03 ENCOUNTER — Other Ambulatory Visit: Payer: Self-pay

## 2019-11-03 VITALS — BP 133/76 | HR 61 | Ht 64.0 in | Wt 214.0 lb

## 2019-11-03 DIAGNOSIS — N3946 Mixed incontinence: Secondary | ICD-10-CM | POA: Diagnosis not present

## 2019-11-03 NOTE — Addendum Note (Signed)
Addended by: Etta Grandchild on: 11/03/2019 10:09 AM   Modules accepted: Orders

## 2019-11-03 NOTE — Progress Notes (Signed)
11/03/2019 11:15 AM   Susan [name] 12/04/1934 [id number]  Referring provider: [name], MD [phone] [place],  [place] [address]  Chief Complaint  Patient presents with  . Urinary Incontinence    HPI: I was consulted to assess the patient urinary incontinence which I believe worsened after her radiation last summer. She may have had a little bit of stress incontinence prior. Now she can leak with coughing and sneezing and bending and lifting if her bladder is full. Her primary symptom is urge incontinence triggered by sitting to standing position and especially worse in the morning with foot on the floor syndrome. She does not have bedwetting. She now wears 3-4 pads a day moderately wet but soaked in the morning  She voids every 1-2 hours worse after taking Lasix. She has no nocturia. She had a hysterectomy followed by chemotherapy followed by radiation for endometrial cancer 18 months ago  Well supported bladder neck with a mild positive cough test  The patient has mixed incontinence were primarily urgency incontinence with typical triggers. It was likely worsen post radiation and hysterectomy. If the patient's stress incontinence was ever treated a urethral injectable would be the treatment of choice  The patient failed Myrbetriq and had diarrhea on Toviaz. She had sensory urgency on urodynamics with a capacity of 260 mL. She has severe leakage with bladder overactivity. Her cough leak point pressure 200 mL was 24 cm of water with moderate leakage. Cystoscopy was within normal limits.The patient felt the radiation made things worse.  The patient has mixed incontinence. 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 The risks are a little bit higher for erosion with injectable recognizing limited data. The role of percutaneous tibial nerve stimulation was discussed.  The my index of suspicion  is high that the radiation caused her bladder become more overactive. She wants to think about percutaneous tibial nerve stimulation. I did mention Botox which is less ideal arguably in the radiation setting  Patient failed percutaneous tibial nerve stimulation.  She does have neuropathy.   Patient and I spoke about Botox with my usual template and she just would rather live with the symptoms and try oxybutynin ER 10 mg with 90 tablets and 3 refills and see in a year. She would like to stop her Eliquis each time. She thinks the oxybutynin helps and she is hoping that the lower dose will give less side effects. Were not going to pursue InterStim. Increased risks and lack of efficacy especially with radiation discussed. I do not think she should have a urethral injectable either.   She has gone to [facility] gynecology and had urodynamics.  They mention the pessary but she does not have prolapse symptoms.  She is scheduled for a bulking agent.  Should they stopped her oxybutynin they put her back on mirabegron on not helping.  She currently leaks a small amount with coughing sneezing.  Still high-volume urge incontinence triggered from sitting to standing position.  Soaking 5 pads a day.  Still on blood thinner.  Unfortunate her husband died from Covid and she is exhausted.  She can drive but not on the highway.  She has neuropathy.  Very lengthy discussion.  I went over bulking treatment in full detail recognizing the chance of her helping her overactive bladder is 50% or less.  We went over Botox briefly.  She need to stop her blood thinner for all  therapies.  She is reaching the end of her treatment choices.  The patient is getting near the end of her treatment algorithm and would like to try the bulking agent.  I gave her 3-day prescription of ciprofloxacin.  She will likely have a done in June after she gets back from a 2-week trip in [place].  She has gone through a lot especially with her  husband's death in 06/09/22.  I will make sure I do a good thorough cystoscopy again prior to the injection.  Rare risk of erosion or infection in radiated tissue discussed.  We will need to call Dr. Claiborne Billings her cardiologist in [place] to make certain she can stop her blood thinner 5 days previous and she said normally she does  Today Patient recently had bulking treatment in [place] at [place].  She still on the Myrbetriq.  She is dramatically better.  She no longer has high-volume leakage when she goes from a sitting to standing position.  She wears 2 pads a day that are damp.  Because of travel concerns from [place] we had left the catheter in overnight and she had a successful trial of voiding the next day  Medically not infected.  Injection was done off her blood thinner.  I found her tissues little bit friable and atrophied I reviewed my injection note the treatment was performed October 01, 2019   PMH: Past Medical History:  Diagnosis Date  . Bradycardia   . Chronic back pain   . Coronary artery disease    a. s/p STEMI in 2014 with DES to LAD with staged PCI/DES placement to LCx  . Echoencephalogram abnormality   . Heart murmur   . History of Holter monitoring   . Hyperlipidemia   . Hypertension   . Myocardial infarction ([place])   . Neuromuscular disorder (HCC)    neuropathy  . NSVT (nonsustained ventricular tachycardia) ([place])   . Personal history of chemotherapy    ENDOMETRIAL CA  . Personal history of radiation therapy    ENDOMETRIAL CA  . Uterine cancer ([place])    serrous    Surgical History: Past Surgical History:  Procedure Laterality Date  . ABDOMINAL HYSTERECTOMY    . BREAST EXCISIONAL BIOPSY Right 1956   NEG  . CARDIOVERSION N/A 07/27/2017   Procedure: CARDIOVERSION;  Surgeon: Skeet Latch, MD;  Location: [place];  Service: Cardiovascular;  Laterality: N/A;  . LEFT HEART CATHETERIZATION WITH CORONARY ANGIOGRAM  10/19/2012   Procedure: LEFT HEART CATHETERIZATION WITH  CORONARY ANGIOGRAM;  Surgeon: Troy Sine, MD;  Location: [facility] CATH LAB;  Service: Cardiovascular;;  . PERCUTANEOUS CORONARY STENT INTERVENTION (PCI-S)  10/19/2012   Procedure: PERCUTANEOUS CORONARY STENT INTERVENTION (PCI-S);  Surgeon: Troy Sine, MD;  Location: [facility] CATH LAB;  Service: Cardiovascular;;  . PERCUTANEOUS CORONARY STENT INTERVENTION (PCI-S) N/A 10/23/2012   Procedure: PERCUTANEOUS CORONARY STENT INTERVENTION (PCI-S);  Surgeon: Troy Sine, MD;  Location: [facility] CATH LAB;  Service: Cardiovascular;  Laterality: N/A;  . PERIPHERAL VASCULAR CATHETERIZATION N/A 05/05/2015   Procedure: Glori Luis Cath Insertion;  Surgeon: Algernon Huxley, MD;  Location: [place] CV LAB;  Service: Cardiovascular;  Laterality: N/A;  . PORTA CATH REMOVAL N/A 04/05/2017   Procedure: PORTA CATH REMOVAL;  Surgeon: Algernon Huxley, MD;  Location: [place] CV LAB;  Service: Cardiovascular;  Laterality: N/A;    Home Medications:  Allergies as of 11/03/2019      Reactions   Ace Inhibitors Cough   Penicillins Rash, Other (See Comments)  Has patient had a PCN reaction causing immediate rash, facial/tongue/throat swelling, SOB or lightheadedness with hypotension: No Has patient had a PCN reaction causing severe rash involving mucus membranes or skin necrosis: No Has patient had a PCN reaction that required hospitalization: No Has patient had a PCN reaction occurring within the last 10 years: No If all of the above answers are "NO", then may proceed with Cephalosporin use.      Medication List       Accurate as of November 03, 2019 11:15 AM. If you have any questions, ask your nurse or doctor.        STOP taking these medications   furosemide 20 MG tablet Commonly known as: LASIX Stopped by: Reece Packer, MD     TAKE these medications   acetaminophen 650 MG CR tablet Commonly known as: TYLENOL Take 650 mg by mouth every 8 (eight) hours as needed for pain.   aspirin EC 81 MG tablet Take 81 mg by  mouth daily.   atorvastatin 80 MG tablet Commonly known as: LIPITOR TAKE 1 TABLET (80 MG TOTAL) BY MOUTH DAILY AT 6 PM.   Eliquis 5 MG Tabs tablet Generic drug: apixaban TAKE 1 TABLET BY MOUTH TWICE A DAY   ezetimibe 10 MG tablet Commonly known as: ZETIA TAKE 1 TABLET BY MOUTH EVERY DAY   fluticasone 50 MCG/ACT nasal spray Commonly known as: FLONASE SPRAY 2 SPRAYS INTO EACH NOSTRIL EVERY DAY   isosorbide mononitrate 30 MG 24 hr tablet Commonly known as: IMDUR Take 1 tablet (30 mg total) by mouth daily.   loperamide 2 MG tablet Commonly known as: IMODIUM A-D Take 2 mg by mouth 4 (four) times daily as needed for diarrhea or loose stools.   losartan 25 MG tablet Commonly known as: COZAAR TAKE 2 TABLETS BY MOUTH EVERY DAY   mirabegron ER 50 MG Tb24 tablet Commonly known as: MYRBETRIQ Take 50 mg by mouth daily.   multivitamin with minerals Tabs tablet Take 1 tablet by mouth daily.   Nitrostat 0.4 MG SL tablet Generic drug: nitroGLYCERIN PLACE 1 TABLET UNDER THE TOUGUE EVERY 5 MINUTES FOR UP TO 3 DOSES AS NEEDED FOR CHEST PAIN   nortriptyline 10 MG capsule Commonly known as: PAMELOR Take 10 mg by mouth at bedtime.   omeprazole 20 MG capsule Commonly known as: PRILOSEC Take 20 mg by mouth daily.   tiZANidine 4 MG tablet Commonly known as: ZANAFLEX Take 4 mg by mouth at bedtime.   vitamin B-12 1000 MCG tablet Commonly known as: CYANOCOBALAMIN Take 1,000 mcg by mouth daily.       Allergies:  Allergies  Allergen Reactions  . Ace Inhibitors Cough  . Penicillins Rash and Other (See Comments)    Has patient had a PCN reaction causing immediate rash, facial/tongue/throat swelling, SOB or lightheadedness with hypotension: No Has patient had a PCN reaction causing severe rash involving mucus membranes or skin necrosis: No Has patient had a PCN reaction that required hospitalization: No Has patient had a PCN reaction occurring within the last 10 years: No If all of  the above answers are "NO", then may proceed with Cephalosporin use.     Family History: Family History  Problem Relation Age of Onset  . Heart disease Mother   . Breast cancer Mother 62  . Heart disease Father   . Diabetes Father   . Diabetes Brother   . Stroke Brother   . Kidney cancer Neg Hx   . Bladder Cancer Neg  Hx     Social History:  reports that she quit smoking about 45 years ago. She has never used smokeless tobacco. She reports current alcohol use of about 1.0 standard drink of alcohol per week. She reports that she does not use drugs.  ROS:                                        Physical Exam: BP 133/76   Pulse 61   Ht 5\' 4"  (1.626 m)   Wt 97.1 kg   BMI 36.73 kg/m    Laboratory Data: Lab Results  Component Value Date   WBC 5.6 02/26/2019   HGB 12.6 02/26/2019   HCT 38.7 02/26/2019   MCV 93.7 02/26/2019   PLT 213 02/26/2019    Lab Results  Component Value Date   CREATININE 1.06 (H) 02/26/2019    No results found for: PSA  No results found for: TESTOSTERONE  Lab Results  Component Value Date   HGBA1C 5.7 (H) 10/19/2012    Urinalysis    Component Value Date/Time   COLORURINE YELLOW (A) 02/20/2018 0905   APPEARANCEUR Cloudy (A) 08/04/2019 1024   LABSPEC 1.013 02/20/2018 0905   PHURINE 5.0 02/20/2018 0905   GLUCOSEU Negative 08/04/2019 1024   HGBUR MODERATE (A) 02/20/2018 0905   BILIRUBINUR Negative 08/04/2019 1024   KETONESUR NEGATIVE 02/20/2018 0905   PROTEINUR Negative 08/04/2019 1024   PROTEINUR 30 (A) 02/20/2018 0905   NITRITE Negative 08/04/2019 1024   NITRITE NEGATIVE 02/20/2018 0905   LEUKOCYTESUR Trace (A) 08/04/2019 1024    Pertinent Imaging:   Assessment & Plan: Very pleased with the response.  Durability discussed.  Retreatment 10 she will discussed.  She is driving to [place] and I asked her to do some scheduled stop so she does not trigger when she gets out of the car.  Stay on the Myrbetriq and  reassess durability in 4 months  There are no diagnoses linked to this encounter.  Return in about 4 months (around 03/04/2020).  Reece Packer, MD  [facility] [address] [place], [place] [address] [phone]

## 2019-11-04 ENCOUNTER — Ambulatory Visit: Payer: Medicare PPO

## 2019-11-04 DIAGNOSIS — R2681 Unsteadiness on feet: Secondary | ICD-10-CM | POA: Diagnosis not present

## 2019-11-04 DIAGNOSIS — M6281 Muscle weakness (generalized): Secondary | ICD-10-CM

## 2019-11-04 NOTE — Therapy (Signed)
[place] MAIN [facility] SERVICES [address] [place], [place], [address] Phone: [phone]   Fax:  [phone]  Physical Therapy Treatment  Patient Details  Name: Susan [name] MRN: [id number] Date of Birth: 20-Aug-1934 Referring Provider (PT): Dr. Melrose Nakayama   Encounter Date: 11/04/2019   PT End of Session - 11/04/19 1316    Visit Number 34    Number of Visits 68    Date for PT Re-Evaluation 11/11/19    Authorization Type eval 06/24/19, Progress note: 10/21/19    PT Start Time 1307    PT Stop Time 7169    PT Time Calculation (min) 38 min    Equipment Utilized During Treatment Gait belt    Activity Tolerance Patient tolerated treatment well;Patient limited by pain    Behavior During Therapy [facility] for tasks assessed/performed           Past Medical History:  Diagnosis Date   Bradycardia    Chronic back pain    Coronary artery disease    a. s/p STEMI in 2014 with DES to LAD with staged PCI/DES placement to LCx   Echoencephalogram abnormality    Heart murmur    History of Holter monitoring    Hyperlipidemia    Hypertension    Myocardial infarction ([place])    Neuromuscular disorder (HCC)    neuropathy   NSVT (nonsustained ventricular tachycardia) ([place])    Personal history of chemotherapy    ENDOMETRIAL CA   Personal history of radiation therapy    ENDOMETRIAL CA   Uterine cancer ([place])    serrous    Past Surgical History:  Procedure Laterality Date   ABDOMINAL HYSTERECTOMY     BREAST EXCISIONAL BIOPSY Right 1956   NEG   CARDIOVERSION N/A 07/27/2017   Procedure: CARDIOVERSION;  Surgeon: Skeet Latch, MD;  Location: [place];  Service: Cardiovascular;  Laterality: N/A;   LEFT HEART CATHETERIZATION WITH CORONARY ANGIOGRAM  10/19/2012   Procedure: LEFT HEART CATHETERIZATION WITH CORONARY ANGIOGRAM;  Surgeon: Troy Sine, MD;  Location: [facility] CATH LAB;  Service: Cardiovascular;;   PERCUTANEOUS CORONARY STENT  INTERVENTION (PCI-S)  10/19/2012   Procedure: PERCUTANEOUS CORONARY STENT INTERVENTION (PCI-S);  Surgeon: Troy Sine, MD;  Location: [facility] CATH LAB;  Service: Cardiovascular;;   PERCUTANEOUS CORONARY STENT INTERVENTION (PCI-S) N/A 10/23/2012   Procedure: PERCUTANEOUS CORONARY STENT INTERVENTION (PCI-S);  Surgeon: Troy Sine, MD;  Location: [facility] CATH LAB;  Service: Cardiovascular;  Laterality: N/A;   PERIPHERAL VASCULAR CATHETERIZATION N/A 05/05/2015   Procedure: Glori Luis Cath Insertion;  Surgeon: Algernon Huxley, MD;  Location: [place] CV LAB;  Service: Cardiovascular;  Laterality: N/A;   PORTA CATH REMOVAL N/A 04/05/2017   Procedure: PORTA CATH REMOVAL;  Surgeon: Algernon Huxley, MD;  Location: [place] CV LAB;  Service: Cardiovascular;  Laterality: N/A;    There were no vitals filed for this visit.   Subjective Assessment - 11/04/19 1311    Subjective Pt reports that she is feeling okay today. She went to her doctor yesterday and will see the urologist in 4 months to make sure things are going well still. Patient's back pain is a 3/10 today, after taking 2 pills before coming. No specific questions or concerns at this time.    Pertinent History Pt reports imbalance related to chemo-induced neuropathy which started 4 years ago. Neuropathy occurs in stocking distribution from knees to feet. Symptoms have been unchanged since that time. She has had one fall in the last 6  months. Denies dizziness or vertigo. No heachaches/migraines. She has a history of chronic back pain and has had a bilateral nerve ablation. Her husband passed away 4 weeks ago after contracting COVID.    Limitations Standing;Walking    How long can you stand comfortably? 5 minutes, (unchanges since last visit) limited due to back pain    How long can you walk comfortably? 5-10 minutes, (unchanges since last visit) limited due to back pain    Patient Stated Goals Strengthen her back muscles to decrease pain and improve her balance     Currently in Pain? Yes    Pain Score 3     Pain Location Back    Pain Orientation Lower    Pain Descriptors / Indicators Aching    Pain Type Chronic pain    Pain Onset More than a month ago            TREATMENT   Therapeutic Exercises Treadmill x 3 minutes 1.1 MPH, dropped down to during history taking;  Sit to stand without UE support and Airex under feet 2kg overhead ball press x 10;  Standing Hip exercises using 5# AW: with UE support Hip abductions x 20 each Hip marches x 20 each Hip extensions x 20 each Heel toe raises x 20     Neuromuscular Re-education  Standing on airex with alternating taps on 6 step with foam pad x 10 each leg; 4 square stepping with walking poles on boundaries CCW and CW x 2 each, cued to take big steps over walking poles, trouble with stepping backwards; Standing on airex pad feet together with basketball shots x multiple bouts, cued for no UE support.    Pt educated throughout session about proper posture and technique with exercises. Improved exercise technique, movement at target joints, use of target muscles after min to mod verbal, visual, tactile cues.    Pt demonstrates excellent motivation today. Patient arrived late, so session was slightly abbreviated. Session focused on a combination of balance and strengthening exercises. Patient increased weight for standing exercises demonstrating increased strength. Patient's balance has improved shown by the implementing of more dynamic balance exercises. Next session will be in the workout gym to show patient how to use machines and what weight would be beneficial. Pt encouraged to follow-up as scheduled. She will benefit from PT services to address deficits in dizziness, balance, and mobility in order to return to full function at home.           PT Short Term Goals - 07/29/19 1452      PT SHORT TERM GOAL #1   Title Pt will be independent with home exercise program in order to  improve balance and leg strength to decrease fall risk and improve function at home    Baseline 07/29/19: Pt reports that she performs "every other day"    Time 6    Period Weeks    Status On-going    Target Date 08/06/19             PT Long Term Goals - 10/21/19 1440      PT LONG TERM GOAL #1   Title Pt will improve BERG to at least 50/56 in order to demonstrate clinically significant improvement in balance.    Baseline 06/24/19: 43/56; 07/29/19: 47/56; 6/22: 47/56 (unchanged); 7/27: 50/56    Time 12    Period Weeks    Status Achieved      PT LONG TERM GOAL #2   Title Pt will  improve ABC by at least 13% in order to demonstrate clinically significant improvement in balance confidence.    Baseline 06/24/19: 35%; 07/29/19: 55.625%    Time 12    Period Weeks    Status Achieved      PT LONG TERM GOAL #3   Title Pt will increase self-selected 10MWT by at least 0.13 m/s in order to demonstrate clinically significant improvement in community ambulation.    Baseline 06/24/19: 14.3s = 0.70 m/s; 07/29/19: 11.5s = 0.87 m/s; on 6/22: 1.18m/s    Time 12    Period Weeks    Status Achieved      PT LONG TERM GOAL #4   Title Pt will improve ABC to greater than 67% in order to demonstrate clinically significant improvement in balance confidence.    Baseline 07/29/19: 55.625%; 7/27: 77.5%    Time 12    Period Weeks    Status Achieved      PT LONG TERM GOAL #5   Title pt will be able to demonstrate proper lifting technique 3/5 times in order to demonstrate improved balance and decrease risk fo falls.     Baseline 5/5 on 12/16/15    Time 4    Period Weeks    Status Achieved      PT LONG TERM GOAL #6   Title Pt will tolerated >1253ft in 6MWT to improve tolerance to AMB in the community for IADL performance.    Baseline 6/22: Unable to tolerate more than 5 minutes AMB due to back pain.; 7/27: 815 feet, last 2 minutes used cane due to back pain    Time 8    Period Weeks    Status On-going    Target  Date 11/11/19                 Plan - 11/04/19 1317    Clinical Impression Statement Pt demonstrates excellent motivation today. Patient arrived late, so session was slightly abbreviated. Session focused on a combination of balance and strengthening exercises. Patient increased weight for standing exercises demonstrating increased strength. Patient's balance has improved shown by the implementing of more dynamic balance exercises. Next session will be in the workout gym to show patient how to use machines and what weight would be beneficial. Pt encouraged to follow-up as scheduled. She will benefit from PT services to address deficits in dizziness, balance, and mobility in order to return to full function at home.    Personal Factors and Comorbidities Age;Comorbidity 3+;Past/Current Experience;Time since onset of injury/illness/exacerbation    Comorbidities Neuropathy, chronic back pain, MI, OA, CAD, HTN    Examination-Activity Limitations Bend;Locomotion Level;Lift;Stairs;Stand    Examination-Participation Restrictions Community Activity;Laundry;Yard Work    Stability/Clinical Decision Making Unstable/Unpredictable    Rehab Potential Fair    PT Frequency 2x / week    PT Duration 12 weeks    PT Treatment/Interventions ADLs/Self Care Home Management;Aquatic Therapy;Biofeedback;Canalith Repostioning;Cryotherapy;Electrical Stimulation;Iontophoresis 4mg /ml Dexamethasone;Moist Heat;Traction;Ultrasound;DME Instruction;Gait training;Stair training;Functional mobility training;Therapeutic activities;Therapeutic exercise;Balance training;Neuromuscular re-education;Cognitive remediation;Patient/family education;Manual techniques;Passive range of motion;Dry needling;Vestibular;Spinal Manipulations;Joint Manipulations    PT Next Visit Plan Update Continue with strength and balance exercises during session. Especially eccentric quad control.    PT Home Exercise Plan Medbridge Access Code: [email or web address]     Consulted and Agree with Plan of Care Patient           Patient will benefit from skilled therapeutic intervention in order to improve the following deficits and impairments:  Abnormal gait, Decreased activity tolerance, Decreased balance, Decreased mobility, Decreased  strength, Difficulty walking, Impaired sensation, Pain  Visit Diagnosis: Unsteadiness on feet  Muscle weakness (generalized)     Problem List Patient Active Problem List   Diagnosis Date Noted   Lumbar degenerative disc disease 10/22/2019   Lumbar facet arthropathy 09/04/2019   Chronic pain syndrome 09/04/2019   Chemotherapy-induced neuropathy ([place]) 04/17/2018   Neuropathy 04/17/2018   Chemotherapy-induced peripheral neuropathy ([place]) 03/01/2018   Persistent atrial fibrillation ([place])    Port-A-Cath in place 03/28/2017   Bradycardia 01/18/2017   Obesity (BMI 30-39.9) 01/17/2017   Pulmonary nodules 12/05/2016   Compression fracture of L5 vertebra with delayed healing 11/01/2016   Sacral insufficiency fracture with delayed healing 11/01/2016   Sacral insufficiency fracture 09/05/2016   Absence of bladder continence 07/21/2015   Neuropathy associated with cancer ([place]) 06/02/2015   Carcinoma of endometrium ([place]) 04/02/2015   Gastro-esophageal reflux disease without esophagitis 02/15/2015   Osteopenia 08/17/2014   Hyperlipidemia 02/13/2014   Kidney stones 10/27/2013   CA skin, basal cell 06/30/2013   Spinal stenosis, lumbar region, with neurogenic claudication 06/30/2013   Arthritis, degenerative 06/30/2013   Neuralgia neuritis, sciatic nerve 06/30/2013   Coronary artery disease 10/25/2012   Dyslipidemia 10/25/2012   NSVT (nonsustained ventricular tachycardia) ([place]) 10/25/2012   Sick sinus syndrome ([place]) 10/25/2012   Ischemic cardiomyopathy 10/22/2012   Old anterior wall myocardial infarction 10/20/2012   Hypertensive heart disease without CHF 10/20/2012    This entire  session was performed under direct supervision and direction of a licensed therapist/therapist assistant . I have personally read, edited and approve of the note as written.   Kimmie Dorothia Passmore SPT Lyndel Safe Huprich PT, DPT, GCS  Huprich,Jason 11/05/2019, 9:52 AM  [place] MAIN [facility] SERVICES [address] [place], [place], [address] Phone: [phone]   Fax:  [phone]  Name: DEAIRRA HALLECK MRN: [id number] Date of Birth: 03/19/1935

## 2019-11-06 ENCOUNTER — Other Ambulatory Visit: Payer: Self-pay

## 2019-11-06 ENCOUNTER — Ambulatory Visit: Payer: Medicare PPO

## 2019-11-06 DIAGNOSIS — R2681 Unsteadiness on feet: Secondary | ICD-10-CM

## 2019-11-06 DIAGNOSIS — M6281 Muscle weakness (generalized): Secondary | ICD-10-CM

## 2019-11-06 NOTE — Therapy (Signed)
[place] MAIN [facility] SERVICES [address] [place], [place], [address] Phone: [phone]   Fax:  [phone]  Physical Therapy Treatment  Patient Details  Name: Susan [name] MRN: [id number] Date of Birth: 1934-12-12 Referring Provider (PT): Dr. Melrose Nakayama   Encounter Date: 11/06/2019   PT End of Session - 11/06/19 1425    Visit Number 35    Number of Visits 56    Date for PT Re-Evaluation 11/11/19    Authorization Type eval 06/24/19, Progress note: 10/21/19    PT Start Time 1430    PT Stop Time 1516    PT Time Calculation (min) 46 min    Equipment Utilized During Treatment Gait belt    Activity Tolerance Patient tolerated treatment well;Patient limited by pain    Behavior During Therapy [facility] for tasks assessed/performed           Past Medical History:  Diagnosis Date  . Bradycardia   . Chronic back pain   . Coronary artery disease    a. s/p STEMI in 2014 with DES to LAD with staged PCI/DES placement to LCx  . Echoencephalogram abnormality   . Heart murmur   . History of Holter monitoring   . Hyperlipidemia   . Hypertension   . Myocardial infarction ([place])   . Neuromuscular disorder (HCC)    neuropathy  . NSVT (nonsustained ventricular tachycardia) ([place])   . Personal history of chemotherapy    ENDOMETRIAL CA  . Personal history of radiation therapy    ENDOMETRIAL CA  . Uterine cancer ([place])    serrous    Past Surgical History:  Procedure Laterality Date  . ABDOMINAL HYSTERECTOMY    . BREAST EXCISIONAL BIOPSY Right 1956   NEG  . CARDIOVERSION N/A 07/27/2017   Procedure: CARDIOVERSION;  Surgeon: Skeet Latch, MD;  Location: [place];  Service: Cardiovascular;  Laterality: N/A;  . LEFT HEART CATHETERIZATION WITH CORONARY ANGIOGRAM  10/19/2012   Procedure: LEFT HEART CATHETERIZATION WITH CORONARY ANGIOGRAM;  Surgeon: Troy Sine, MD;  Location: [facility] CATH LAB;  Service: Cardiovascular;;  . PERCUTANEOUS CORONARY STENT  INTERVENTION (PCI-S)  10/19/2012   Procedure: PERCUTANEOUS CORONARY STENT INTERVENTION (PCI-S);  Surgeon: Troy Sine, MD;  Location: [facility] CATH LAB;  Service: Cardiovascular;;  . PERCUTANEOUS CORONARY STENT INTERVENTION (PCI-S) N/A 10/23/2012   Procedure: PERCUTANEOUS CORONARY STENT INTERVENTION (PCI-S);  Surgeon: Troy Sine, MD;  Location: [facility] CATH LAB;  Service: Cardiovascular;  Laterality: N/A;  . PERIPHERAL VASCULAR CATHETERIZATION N/A 05/05/2015   Procedure: Glori Luis Cath Insertion;  Surgeon: Algernon Huxley, MD;  Location: [place] CV LAB;  Service: Cardiovascular;  Laterality: N/A;  . PORTA CATH REMOVAL N/A 04/05/2017   Procedure: PORTA CATH REMOVAL;  Surgeon: Algernon Huxley, MD;  Location: [place] CV LAB;  Service: Cardiovascular;  Laterality: N/A;    There were no vitals filed for this visit.   Subjective Assessment - 11/06/19 1424    Subjective Pt reports that she is feeling okay today. Patient's back pain is not bad today, just noticeable about 2/10 today, after taking 2 pills before coming. No specific questions or concerns at this time.    Pertinent History Pt reports imbalance related to chemo-induced neuropathy which started 4 years ago. Neuropathy occurs in stocking distribution from knees to feet. Symptoms have been unchanged since that time. She has had one fall in the last 6 months. Denies dizziness or vertigo. No heachaches/migraines. She has a history of chronic back pain and has  had a bilateral nerve ablation. Her husband passed away 4 weeks ago after contracting COVID.    Limitations Standing;Walking    How long can you stand comfortably? 5 minutes, (unchanges since last visit) limited due to back pain    How long can you walk comfortably? 5-10 minutes, (unchanges since last visit) limited due to back pain    Patient Stated Goals Strengthen her back muscles to decrease pain and improve her balance    Currently in Pain? Yes    Pain Score 2     Pain Location Back    Pain  Orientation Lower    Pain Descriptors / Indicators Aching    Pain Type Chronic pain    Pain Onset More than a month ago           TREATMENT:  Session was in the fitness center to help patient become familiar with the equipment and get fitted for the right seat position and weight.   Leg Press: 133# 2 x 20 Leg Curl: 32# 2 x 20  Leg Extension: 32# 2 x 20 Row machine: 30# 1 x 20 Lat pull downs: 25# 1 x 20 Triceps extension: 15# 1 x 20 Bicep curls: 11# 1 x 20 Shoulder Press: 8# 2 x 5 Incline press: 8# 1 x 10  Octane x 5 minutes, seat height 2 Nustep x 5 minutes, seat: 9, arm: 12  Pt educated throughout session about proper posture and technique with exercises. Improved exercise technique, movement at target joints, use of target muscles after min to mod verbal, visual, tactile cues.       Patient demonstrates excellent motivation throughout physical therapy session. Session today focused on strengthening activities in the [facility]. Found the correct seat position and weight for the exercise machines so she can continue exercising on her own after PT. Patient was informed about the exercise classes available for her to participate in. Verbal cues needed for proper technique. Next session will update HEP and prepare for discharge. Pt encouraged to continue HEP and follow-up as scheduled. Pt will benefit from PT services to address deficits in strength, balance, and mobility in order to return to full function at home.          PT Short Term Goals - 07/29/19 1452      PT SHORT TERM GOAL #1   Title Pt will be independent with home exercise program in order to improve balance and leg strength to decrease fall risk and improve function at home    Baseline 07/29/19: Pt reports that she performs "every other day"    Time 6    Period Weeks    Status On-going    Target Date 08/06/19             PT Long Term Goals - 10/21/19 1440      PT LONG TERM GOAL #1   Title Pt will  improve BERG to at least 50/56 in order to demonstrate clinically significant improvement in balance.    Baseline 06/24/19: 43/56; 07/29/19: 47/56; 6/22: 47/56 (unchanged); 7/27: 50/56    Time 12    Period Weeks    Status Achieved      PT LONG TERM GOAL #2   Title Pt will improve ABC by at least 13% in order to demonstrate clinically significant improvement in balance confidence.    Baseline 06/24/19: 35%; 07/29/19: 55.625%    Time 12    Period Weeks    Status Achieved  PT LONG TERM GOAL #3   Title Pt will increase self-selected 10MWT by at least 0.13 m/s in order to demonstrate clinically significant improvement in community ambulation.    Baseline 06/24/19: 14.3s = 0.70 m/s; 07/29/19: 11.5s = 0.87 m/s; on 6/22: 1.27m/s    Time 12    Period Weeks    Status Achieved      PT LONG TERM GOAL #4   Title Pt will improve ABC to greater than 67% in order to demonstrate clinically significant improvement in balance confidence.    Baseline 07/29/19: 55.625%; 7/27: 77.5%    Time 12    Period Weeks    Status Achieved      PT LONG TERM GOAL #5   Title pt will be able to demonstrate proper lifting technique 3/5 times in order to demonstrate improved balance and decrease risk fo falls.     Baseline 5/5 on 12/16/15    Time 4    Period Weeks    Status Achieved      PT LONG TERM GOAL #6   Title Pt will tolerated >1267ft in 6MWT to improve tolerance to AMB in the community for IADL performance.    Baseline 6/22: Unable to tolerate more than 5 minutes AMB due to back pain.; 7/27: 815 feet, last 2 minutes used cane due to back pain    Time 8    Period Weeks    Status On-going    Target Date 11/11/19                 Plan - 11/06/19 1425    Clinical Impression Statement Patient demonstrates excellent motivation throughout physical therapy session. Session today focused on strengthening activities in the [facility]. Found the correct seat position and weight for the exercise machines so she  can continue exercising on her own after PT. Patient was informed about the exercise classes available for her to participate in. Verbal cues needed for proper technique. Next session will update HEP and prepare for discharge. Pt encouraged to continue HEP and follow-up as scheduled. Pt will benefit from PT services to address deficits in strength, balance, and mobility in order to return to full function at home.    Personal Factors and Comorbidities Age;Comorbidity 3+;Past/Current Experience;Time since onset of injury/illness/exacerbation    Comorbidities Neuropathy, chronic back pain, MI, OA, CAD, HTN    Examination-Activity Limitations Bend;Locomotion Level;Lift;Stairs;Stand    Examination-Participation Restrictions Community Activity;Laundry;Yard Work    Stability/Clinical Decision Making Unstable/Unpredictable    Rehab Potential Fair    PT Frequency 2x / week    PT Duration 12 weeks    PT Treatment/Interventions ADLs/Self Care Home Management;Aquatic Therapy;Biofeedback;Canalith Repostioning;Cryotherapy;Electrical Stimulation;Iontophoresis 4mg /ml Dexamethasone;Moist Heat;Traction;Ultrasound;DME Instruction;Gait training;Stair training;Functional mobility training;Therapeutic activities;Therapeutic exercise;Balance training;Neuromuscular re-education;Cognitive remediation;Patient/family education;Manual techniques;Passive range of motion;Dry needling;Vestibular;Spinal Manipulations;Joint Manipulations    PT Next Visit Plan Discharge next visit    PT [place] Access Code: [email or web address]    Consulted and Agree with Plan of Care Patient           Patient will benefit from skilled therapeutic intervention in order to improve the following deficits and impairments:  Abnormal gait, Decreased activity tolerance, Decreased balance, Decreased mobility, Decreased strength, Difficulty walking, Impaired sensation, Pain  Visit Diagnosis: Unsteadiness on feet  Muscle weakness  (generalized)     Problem List Patient Active Problem List   Diagnosis Date Noted  . Lumbar degenerative disc disease 10/22/2019  . Lumbar facet arthropathy 09/04/2019  . Chronic pain syndrome 09/04/2019  .  Chemotherapy-induced neuropathy ([place]) 04/17/2018  . Neuropathy 04/17/2018  . Chemotherapy-induced peripheral neuropathy ([place]) 03/01/2018  . Persistent atrial fibrillation ([place])   . Port-A-Cath in place 03/28/2017  . Bradycardia 01/18/2017  . Obesity (BMI 30-39.9) 01/17/2017  . Pulmonary nodules 12/05/2016  . Compression fracture of L5 vertebra with delayed healing 11/01/2016  . Sacral insufficiency fracture with delayed healing 11/01/2016  . Sacral insufficiency fracture 09/05/2016  . Absence of bladder continence 07/21/2015  . Neuropathy associated with cancer ([place]) 06/02/2015  . Carcinoma of endometrium ([place]) 04/02/2015  . Gastro-esophageal reflux disease without esophagitis 02/15/2015  . Osteopenia 08/17/2014  . Hyperlipidemia 02/13/2014  . Kidney stones 10/27/2013  . CA skin, basal cell 06/30/2013  . Spinal stenosis, lumbar region, with neurogenic claudication 06/30/2013  . Arthritis, degenerative 06/30/2013  . Neuralgia neuritis, sciatic nerve 06/30/2013  . Coronary artery disease 10/25/2012  . Dyslipidemia 10/25/2012  . NSVT (nonsustained ventricular tachycardia) ([place]) 10/25/2012  . Sick sinus syndrome ([place]) 10/25/2012  . Ischemic cardiomyopathy 10/22/2012  . Old anterior wall myocardial infarction 10/20/2012  . Hypertensive heart disease without CHF 10/20/2012    This entire session was performed under direct supervision and direction of a licensed therapist/therapist assistant . I have personally read, edited and approve of the note as written.   Noemi Chapel, SPT Phillips Grout PT, DPT, GCS  Huprich,Jason 11/06/2019, 4:43 PM  [place] MAIN [facility] SERVICES [address] [place], [place], [address] Phone: [phone]    Fax:  [phone]  Name: Susan [name] MRN: [id number] Date of Birth: October 30, 1934

## 2019-11-11 ENCOUNTER — Other Ambulatory Visit: Payer: Self-pay

## 2019-11-11 ENCOUNTER — Ambulatory Visit: Payer: Medicare PPO

## 2019-11-11 DIAGNOSIS — M6281 Muscle weakness (generalized): Secondary | ICD-10-CM

## 2019-11-11 DIAGNOSIS — R2681 Unsteadiness on feet: Secondary | ICD-10-CM

## 2019-11-11 NOTE — Therapy (Signed)
[place] MAIN [facility] SERVICES [address] [place], [place], [address] Phone: [phone]   Fax:  [phone]  Physical Therapy Treatment / Discharge Note  Patient Details  Name: Susan [name] MRN: [id number] Date of Birth: 1934-04-30 Referring Provider (PT): Dr. Melrose Nakayama   Encounter Date: 11/11/2019   PT End of Session - 11/11/19 1357    Visit Number 36    Number of Visits 56    Date for PT Re-Evaluation 11/11/19    Authorization Type eval 06/24/19, Progress note: 10/21/19    PT Start Time 1450    PT Stop Time 1530    PT Time Calculation (min) 40 min    Equipment Utilized During Treatment Gait belt    Activity Tolerance Patient tolerated treatment well;Patient limited by pain    Behavior During Therapy WFL for tasks assessed/performed           Past Medical History:  Diagnosis Date  . Bradycardia   . Chronic back pain   . Coronary artery disease    a. s/p STEMI in 2014 with DES to LAD with staged PCI/DES placement to LCx  . Echoencephalogram abnormality   . Heart murmur   . History of Holter monitoring   . Hyperlipidemia   . Hypertension   . Myocardial infarction ([place])   . Neuromuscular disorder (HCC)    neuropathy  . NSVT (nonsustained ventricular tachycardia) ([place])   . Personal history of chemotherapy    ENDOMETRIAL CA  . Personal history of radiation therapy    ENDOMETRIAL CA  . Uterine cancer ([place])    serrous    Past Surgical History:  Procedure Laterality Date  . ABDOMINAL HYSTERECTOMY    . BREAST EXCISIONAL BIOPSY Right 1956   NEG  . CARDIOVERSION N/A 07/27/2017   Procedure: CARDIOVERSION;  Surgeon: Skeet Latch, MD;  Location: [place];  Service: Cardiovascular;  Laterality: N/A;  . LEFT HEART CATHETERIZATION WITH CORONARY ANGIOGRAM  10/19/2012   Procedure: LEFT HEART CATHETERIZATION WITH CORONARY ANGIOGRAM;  Surgeon: Troy Sine, MD;  Location: [facility] CATH LAB;  Service: Cardiovascular;;  . PERCUTANEOUS  CORONARY STENT INTERVENTION (PCI-S)  10/19/2012   Procedure: PERCUTANEOUS CORONARY STENT INTERVENTION (PCI-S);  Surgeon: Troy Sine, MD;  Location: [facility] CATH LAB;  Service: Cardiovascular;;  . PERCUTANEOUS CORONARY STENT INTERVENTION (PCI-S) N/A 10/23/2012   Procedure: PERCUTANEOUS CORONARY STENT INTERVENTION (PCI-S);  Surgeon: Troy Sine, MD;  Location: [facility] CATH LAB;  Service: Cardiovascular;  Laterality: N/A;  . PERIPHERAL VASCULAR CATHETERIZATION N/A 05/05/2015   Procedure: Glori Luis Cath Insertion;  Surgeon: Algernon Huxley, MD;  Location: [place] CV LAB;  Service: Cardiovascular;  Laterality: N/A;  . PORTA CATH REMOVAL N/A 04/05/2017   Procedure: PORTA CATH REMOVAL;  Surgeon: Algernon Huxley, MD;  Location: [place] CV LAB;  Service: Cardiovascular;  Laterality: N/A;    There were no vitals filed for this visit.   Subjective Assessment - 11/11/19 1356    Subjective Pt reports that she is feeling okay today. Patient's back pain about a 5/10 today, after taking 2 pills before coming. No specific questions or concerns at this time.    Pertinent History Pt reports imbalance related to chemo-induced neuropathy which started 4 years ago. Neuropathy occurs in stocking distribution from knees to feet. Symptoms have been unchanged since that time. She has had one fall in the last 6 months. Denies dizziness or vertigo. No heachaches/migraines. She has a history of chronic back pain and has had a  bilateral nerve ablation. Her husband passed away 4 weeks ago after contracting COVID.    Limitations Standing;Walking    How long can you stand comfortably? 5 minutes, (unchanges since last visit) limited due to back pain    How long can you walk comfortably? 5-10 minutes, (unchanges since last visit) limited due to back pain    Patient Stated Goals Strengthen her back muscles to decrease pain and improve her balance    Currently in Pain? Yes    Pain Score 5     Pain Location Back    Pain Orientation Lower     Pain Descriptors / Indicators Aching    Pain Type Chronic pain    Pain Onset More than a month ago            TREATMENT   Ther-Ex Octane L4, seat height 8, x 5 minutes for warm-up during history (unbilled 2 minutes);  Reviewed and performed HEP: Romberg Stance with Head Rotation x 30s Romberg Stance with Head Nods x 30s Romberg Stance with Eyes Closed x 30s Seated March with Resistance x 10 with 3 sec holds Seated Hip Abduction with Resistance x 10 with 3 sec holds Sit to Stand without Arm Support x 10 reps Standing Hip Abduction with Counter Support x 10 Standing Hip Extension with Counter Support x 10 Standing March with Counter Support x 10 Heel Toe Raises with Counter Support x 10   Neuromuscular Re-education Standing on airex pad feet together with basketball shots x multiple bouts, reaching across the body to grab the ball, cued for no UE support Kicking a soccer ball x multiple attempts, able to stabilize on single leg and coordinate LE to kick ball, cued to not use UE support.   Pt educated throughout session about proper posture and technique with exercises. Improved exercise technique, movement at target joints, use of target muscles after min to mod verbal, visual, tactile cues.      Pt demonstrates excellent motivation during session today. Performed both balance and strength exercises with patient today. Updated and performed HEP. Updated the goals a few visits ago, so did not update goals today. However, she has met all her goals except the 6MWT which is limited secondary to her back pain. BERG improved from 43/56 at initial evaluation to 50/56. 42mgait speed improved from 0.70 m/s to 1.05 m/s. ABC improved from 35% to 77.5%. Pt encouraged to continue her HEP and follow-up after talking with doctor about the back procedure in a couple of weeks. Pt will be discharged at this time.     PT Education - 11/12/19 1140    Education Details HEP review    Person(s)  Educated Patient    Methods Explanation;Demonstration;Handout    Comprehension Verbalized understanding            PT Short Term Goals - 11/11/19 1750      PT SHORT TERM GOAL #1   Title Pt will be independent with home exercise program in order to improve balance and leg strength to decrease fall risk and improve function at home    Baseline 07/29/19: Pt reports that she performs "every other day"; 11/11/19: Reports not doing HEP.    Time 6    Period Weeks    Status On-going    Target Date 08/06/19             PT Long Term Goals - 11/11/19 1750      PT LONG TERM GOAL #1   Title  Pt will improve BERG to at least 50/56 in order to demonstrate clinically significant improvement in balance.    Baseline 06/24/19: 43/56; 07/29/19: 47/56; 6/22: 47/56 (unchanged); 7/27: 50/56    Time 12    Period Weeks    Status Achieved      PT LONG TERM GOAL #2   Title Pt will improve ABC by at least 13% in order to demonstrate clinically significant improvement in balance confidence.    Baseline 06/24/19: 35%; 07/29/19: 55.625%    Time 12    Period Weeks    Status Achieved      PT LONG TERM GOAL #3   Title Pt will increase self-selected 10MWT by at least 0.13 m/s in order to demonstrate clinically significant improvement in community ambulation.    Baseline 06/24/19: 14.3s = 0.70 m/s; 07/29/19: 11.5s = 0.87 m/s; on 6/22: 1.43ms    Time 12    Period Weeks    Status Achieved      PT LONG TERM GOAL #4   Title Pt will improve ABC to greater than 67% in order to demonstrate clinically significant improvement in balance confidence.    Baseline 07/29/19: 55.625%; 7/27: 77.5%    Time 12    Period Weeks    Status Achieved      PT LONG TERM GOAL #5   Title pt will be able to demonstrate proper lifting technique 3/5 times in order to demonstrate improved balance and decrease risk fo falls.     Baseline 5/5 on 12/16/15    Time 4    Period Weeks    Status Achieved      PT LONG TERM GOAL #6   Title Pt will  tolerated >12076fin 6MWT to improve tolerance to AMB in the community for IADL performance.    Baseline 6/22: Unable to tolerate more than 5 minutes AMB due to back pain.; 7/27: 815 feet, last 2 minutes used cane due to back pain    Time 8    Period Weeks    Status On-going                 Plan - 11/11/19 1358    Clinical Impression Statement Pt demonstrates excellent motivation during session today. Performed both balance and strength exercises with patient today. Updated and performed HEP. Updated the goals a few visits ago, so did not update goals today. However, she has met all her goals except the 6MWT which is limited secondary to her back pain. BERG improved from 43/56 at initial evaluation to 50/56. 1019mit speed improved from 0.70 m/s to 1.05 m/s. ABC improved from 35% to 77.5%. Pt encouraged to continue her HEP and follow-up after talking with doctor about the back procedure in a couple of weeks. Pt will be discharged at this time.    Personal Factors and Comorbidities Age;Comorbidity 3+;Past/Current Experience;Time since onset of injury/illness/exacerbation    Comorbidities Neuropathy, chronic back pain, MI, OA, CAD, HTN    Examination-Activity Limitations Bend;Locomotion Level;Lift;Stairs;Stand    Examination-Participation Restrictions Community Activity;Laundry;Yard Work    Stability/Clinical Decision Making Unstable/Unpredictable    Rehab Potential Fair    PT Frequency 2x / week    PT Duration 12 weeks    PT Treatment/Interventions ADLs/Self Care Home Management;Aquatic Therapy;Biofeedback;Canalith Repostioning;Cryotherapy;Electrical Stimulation;Iontophoresis 4mg25m Dexamethasone;Moist Heat;Traction;Ultrasound;DME Instruction;Gait training;Stair training;Functional mobility training;Therapeutic activities;Therapeutic exercise;Balance training;Neuromuscular re-education;Cognitive remediation;Patient/family education;Manual techniques;Passive range of motion;Dry  needling;Vestibular;Spinal Manipulations;Joint Manipulations    PT Next Visit Plan Discharged.    PT [place]  Access Code: [email or web address]    Consulted and Agree with Plan of Care Patient           Patient will benefit from skilled therapeutic intervention in order to improve the following deficits and impairments:  Abnormal gait, Decreased activity tolerance, Decreased balance, Decreased mobility, Decreased strength, Difficulty walking, Impaired sensation, Pain  Visit Diagnosis: Unsteadiness on feet  Muscle weakness (generalized)     Problem List Patient Active Problem List   Diagnosis Date Noted  . Lumbar degenerative disc disease 10/22/2019  . Lumbar facet arthropathy 09/04/2019  . Chronic pain syndrome 09/04/2019  . Chemotherapy-induced neuropathy ([place]) 04/17/2018  . Neuropathy 04/17/2018  . Chemotherapy-induced peripheral neuropathy ([place]) 03/01/2018  . Persistent atrial fibrillation ([place])   . Port-A-Cath in place 03/28/2017  . Bradycardia 01/18/2017  . Obesity (BMI 30-39.9) 01/17/2017  . Pulmonary nodules 12/05/2016  . Compression fracture of L5 vertebra with delayed healing 11/01/2016  . Sacral insufficiency fracture with delayed healing 11/01/2016  . Sacral insufficiency fracture 09/05/2016  . Absence of bladder continence 07/21/2015  . Neuropathy associated with cancer ([place]) 06/02/2015  . Carcinoma of endometrium ([place]) 04/02/2015  . Gastro-esophageal reflux disease without esophagitis 02/15/2015  . Osteopenia 08/17/2014  . Hyperlipidemia 02/13/2014  . Kidney stones 10/27/2013  . CA skin, basal cell 06/30/2013  . Spinal stenosis, lumbar region, with neurogenic claudication 06/30/2013  . Arthritis, degenerative 06/30/2013  . Neuralgia neuritis, sciatic nerve 06/30/2013  . Coronary artery disease 10/25/2012  . Dyslipidemia 10/25/2012  . NSVT (nonsustained ventricular tachycardia) ([place]) 10/25/2012  . Sick sinus syndrome ([place]) 10/25/2012  . Ischemic  cardiomyopathy 10/22/2012  . Old anterior wall myocardial infarction 10/20/2012  . Hypertensive heart disease without CHF 10/20/2012    This entire session was performed under direct supervision and direction of a licensed therapist/therapist assistant . I have personally read, edited and approve of the note as written.   Noemi Chapel, SPT Phillips Grout PT, DPT, GCS  Huprich,Jason 11/12/2019, 11:45 AM  [place] MAIN [facility] SERVICES [address] [place], [place], [address] Phone: [phone]   Fax:  [phone]  Name: Susan [name] MRN: [id number] Date of Birth: 1934-06-01

## 2019-11-11 NOTE — Patient Instructions (Signed)
Access Code: [email or web address] URL: [email or web address] Date: 11/11/2019 Prepared by: Roxana Hires  Exercises Romberg Stance with Head Rotation - 1 x daily - 7 x weekly - 3 reps - 30s hold Romberg Stance with Head Nods - 1 x daily - 7 x weekly - 3 reps - 30s hold Romberg Stance with Eyes Closed - 1 x daily - 7 x weekly - 3 reps - 30s hold Seated March with Resistance - 1 x daily - 7 x weekly - 2 sets - 10 reps - 3s hold Seated Hip Abduction with Resistance - 1 x daily - 7 x weekly - 2 sets - 10 reps - 3s hold Sit to Stand without Arm Support - 1 x daily - 7 x weekly - 2 sets - 10 reps Standing Hip Abduction with Counter Support - 1 x daily - 7 x weekly - 2 sets - 10 reps Standing Hip Extension with Counter Support - 1 x daily - 7 x weekly - 2 sets - 10 reps Standing March with Counter Support - 1 x daily - 7 x weekly - 2 sets - 10 reps Heel Toe Raises with Counter Support - 1 x daily - 7 x weekly - 2 sets - 10 reps

## 2019-11-13 ENCOUNTER — Ambulatory Visit: Payer: Medicare PPO

## 2019-11-18 ENCOUNTER — Ambulatory Visit: Payer: Medicare PPO

## 2019-11-20 ENCOUNTER — Ambulatory Visit: Payer: Medicare PPO

## 2019-11-25 ENCOUNTER — Ambulatory Visit: Payer: Medicare PPO

## 2019-11-27 ENCOUNTER — Ambulatory Visit: Payer: Medicare PPO

## 2019-12-02 ENCOUNTER — Ambulatory Visit: Payer: Medicare PPO

## 2019-12-04 ENCOUNTER — Encounter: Payer: Self-pay | Admitting: Student in an Organized Health Care Education/Training Program

## 2019-12-04 ENCOUNTER — Ambulatory Visit
Payer: Medicare PPO | Attending: Student in an Organized Health Care Education/Training Program | Admitting: Student in an Organized Health Care Education/Training Program

## 2019-12-04 ENCOUNTER — Ambulatory Visit: Payer: Medicare PPO

## 2019-12-04 ENCOUNTER — Other Ambulatory Visit: Payer: Self-pay

## 2019-12-04 VITALS — BP 123/59 | HR 67 | Temp 97.1°F | Resp 18 | Ht 64.0 in | Wt 204.0 lb

## 2019-12-04 DIAGNOSIS — C801 Malignant (primary) neoplasm, unspecified: Secondary | ICD-10-CM | POA: Insufficient documentation

## 2019-12-04 DIAGNOSIS — G894 Chronic pain syndrome: Secondary | ICD-10-CM | POA: Diagnosis present

## 2019-12-04 DIAGNOSIS — G63 Polyneuropathy in diseases classified elsewhere: Secondary | ICD-10-CM | POA: Insufficient documentation

## 2019-12-04 DIAGNOSIS — M47816 Spondylosis without myelopathy or radiculopathy, lumbar region: Secondary | ICD-10-CM | POA: Insufficient documentation

## 2019-12-04 DIAGNOSIS — M48062 Spinal stenosis, lumbar region with neurogenic claudication: Secondary | ICD-10-CM

## 2019-12-04 DIAGNOSIS — M5136 Other intervertebral disc degeneration, lumbar region: Secondary | ICD-10-CM | POA: Diagnosis present

## 2019-12-04 NOTE — Progress Notes (Signed)
PROVIDER NOTE: Information contained herein reflects review and annotations entered in association with encounter. Interpretation of such information and data should be left to medically-trained personnel. Information provided to patient can be located elsewhere in the medical record under "Patient Instructions". Document created using STT-dictation technology, any transcriptional errors that may result from process are unintentional.    Patient: Susan [name]  Service Category: E/M  Provider: Gillis Santa, MD  DOB: [birth year]  DOS: 12/04/2019  Specialty: Interventional Pain Management  MRN: [id number]  Setting: Ambulatory outpatient  PCP: [name], MD  Type: Established Patient    Referring Provider: [name], MD  Location: Office  Delivery: Face-to-face     HPI  Reason for encounter: [name], a 85 y.o. year old female, is here today for evaluation and management of her Lumbar degenerative disc disease [M51.36]. Ms. Benning primary complain today is Back Pain Last encounter: Practice (10/22/2019). My last encounter with her was on 10/22/2019. Pertinent problems: Susan [name] has Ischemic cardiomyopathy; Coronary artery disease; Spinal stenosis, lumbar region, with neurogenic claudication; Osteopenia; Arthritis, degenerative; Carcinoma of endometrium ([place]); Neuropathy associated with cancer ([place]); Chemotherapy-induced peripheral neuropathy ([place]); Chemotherapy-induced neuropathy ([place]); Compression fracture of L5 vertebra with delayed healing; Neuropathy; Sacral insufficiency fracture with delayed healing; Lumbar facet arthropathy; and Chronic pain syndrome on their pertinent problem list. Pain Assessment: Severity of Chronic pain is reported as a 5 /10. Location: Back Medial/ . Onset: More than a month ago. Quality: Stabbing. Timing: Constant. Modifying factor(s): sitting.. Vitals:  height is _0  (1.626 m) and weight is 204 lb (92.5 kg). Her temporal temperature is 97.1 F  (36.2 C) (abnormal). Her blood pressure is 123/59 (abnormal) and her pulse is 67. Her respiration is 18 and oxygen saturation is 97%.    Patient presents today to discuss interventional options for her persistent low back pain related to lumbar spinal stenosis, severe lumbar degenerative disc disease, lumbar facet arthropathy, lumbar spondylosis.  She also has significant loss of disc height in her lumbar region.  For the patient's severe spinal stenosis, we were considering minimally invasive lumbar decompression however upon reviewing fluoroscopy imaging and MRI imaging, patient has severe loss of disc height which closes off her interlaminar space making it practically impossible to obtain interlaminar access to decompress her L4-L5 ligamentum flavum.  Patient has had diagnostic lumbar facet medial branch nerve blocks in the past along with right L3-L4-L5 lumbar radiofrequency ablation on 04/14/2019, left L3-L4-L5 lumbar radiofrequency ablation on 01/16/2018.  This did provide the patient with moderate pain relief for approximately 5 to 6 months.  Now she is having increased low back pain that is worse with lumbar extension.  We discussed Sprint peripheral nerve stimulation of lumbar medial branch as a potential therapy to help with her low back pain related to facet pathology and spondylosis.  Risks and benefits were discussed in great detail and patient would like to proceed.  We will get her scheduled as below.   ROS  Constitutional: Denies any fever or chills Gastrointestinal: No reported hemesis, hematochezia, vomiting, or acute GI distress Musculoskeletal: axial LBP and superior buttock pain Neurological: No reported episodes of acute onset apraxia, aphasia, dysarthria, agnosia, amnesia, paralysis, loss of coordination, or loss of consciousness  Medication Review  acetaminophen, apixaban, aspirin EC, atorvastatin, ezetimibe, fluticasone, isosorbide mononitrate, loperamide, losartan, mirabegron  ER, multivitamin with minerals, nitroGLYCERIN, nortriptyline, omeprazole, tiZANidine, and vitamin B-12  History Review  Allergy: Susan [name] is allergic to ace inhibitors and penicillins. Drug: Susan [name]  reports no history of drug use. Alcohol:  reports current alcohol use of about 1.0 standard drink of alcohol per week. Tobacco:  reports that she quit smoking about 45 years ago. She has never used smokeless tobacco. Social: Ms. Overdorf  reports that she quit smoking about 45 years ago. She has never used smokeless tobacco. She reports current alcohol use of about 1.0 standard drink of alcohol per week. She reports that she does not use drugs. Medical:  has a past medical history of Bradycardia, Chronic back pain, Coronary artery disease, Echoencephalogram abnormality, Heart murmur, History of Holter monitoring, Hyperlipidemia, Hypertension, Myocardial infarction [facility]), Neuromuscular disorder ([place]), NSVT (nonsustained ventricular tachycardia) ([place]), Personal history of chemotherapy, Personal history of radiation therapy, and Uterine cancer ([place]). Surgical: Susan [name]  has a past surgical history that includes left heart catheterization with coronary angiogram (10/19/2012); percutaneous coronary stent intervention (pci-s) (10/19/2012); percutaneous coronary stent intervention (pci-s) (N/A, 10/23/2012); Abdominal hysterectomy; Cardiac catheterization (N/A, 05/05/2015); Breast excisional biopsy (Right, 1956); PORTA CATH REMOVAL (N/A, 04/05/2017); and Cardioversion (N/A, 07/27/2017). Family: family history includes Breast cancer (age of onset: 73) in her mother; Diabetes in her brother and father; Heart disease in her father and mother; Stroke in her brother.  Laboratory Chemistry Profile   Renal Lab Results  Component Value Date   BUN 18 02/26/2019   CREATININE 1.06 (H) 02/26/2019   GFRAA 57 (L) 02/26/2019   GFRNONAA 49 (L) 02/26/2019     Hepatic Lab Results  Component Value Date   AST 25  02/26/2019   ALT 14 02/26/2019   ALBUMIN 4.0 02/26/2019   ALKPHOS 92 02/26/2019     Electrolytes Lab Results  Component Value Date   NA 137 02/26/2019   K 3.7 02/26/2019   CL 103 02/26/2019   CALCIUM 9.5 02/26/2019   MG 2.2 07/14/2015     Bone No results found for: VD25OH, VD125OH2TOT, [id number], [id number], 25OHVITD1, 25OHVITD2, 25OHVITD3, TESTOFREE, TESTOSTERONE   Inflammation (CRP: Acute Phase) (ESR: Chronic Phase) No results found for: CRP, ESRSEDRATE, LATICACIDVEN     Note: Above Lab results reviewed.  Recent Imaging Review  MR LUMBAR SPINE WO CONTRAST CLINICAL DATA:  Low back pain with lumbar radiculopathy.  EXAM: MRI LUMBAR SPINE WITHOUT CONTRAST  TECHNIQUE: Multiplanar, multisequence MR imaging of the lumbar spine was performed. No intravenous contrast was administered.  COMPARISON:  MRI of the lumbar spine December 12, 2017  FINDINGS: Segmentation:  Standard.  Alignment: Levoconvex scoliosis of the lumbar spine. A grade 1 anterolisthesis of L4 over L5, unchanged. Small retrolisthesis of L2 over L3.  Vertebrae: No fracture or evidence of discitis. T1 hypointense/T2 hyperintense lesions are seen within the sacrum bilaterally on sagittal images, only partially evaluated. However, does appear stable since prior MRI. Endplate degenerative changes throughout the lumbar spine, more pronounced at L1-2 and L3-4.  Conus medullaris and cauda equina: Conus extends to the T12-L1 level. Conus and cauda equina appear normal.  Paraspinal and other soft tissues: Bilateral renal cysts  Disc levels:  T11-12: Left central posterior disc osteophyte complex causing indentation of the thecal sac without significant spinal canal or neural foraminal stenosis.  T12-L1: Left asymmetric disc bulge and mild facet degenerative changes. No significant spinal canal or neural foraminal stenosis.  L1-2: Loss of disc height, disc bulge with associated osteophytic component and  superimposed small central disc extrusion migrating inferiorly. There are also facet degenerative changes and ligamentum flavum redundancy, findings result in narrowing of the right subarticular zone, mild spinal canal stenosis and moderate bilateral  neural foraminal narrowing.  L2-3: Loss of disc height with partial disc space fusion, right foraminal and extraforaminal disc protrusion with prominent osteophytic component and superimposed ossified right central disc extrusion. There are also facet degenerative changes and ligamentum flavum redundancy. Findings result in mild spinal canal stenosis with effacement of the right subarticular zone and severe right neural foraminal narrowing.  L3-4: Loss of disc height, right asymmetric disc bulge with associated osteophytic component and facet degenerative changes resulting in mild spinal canal stenosis with narrowing of the bilateral subarticular zones, moderate to severe right and moderate left neural foraminal narrowing.  L4-5: Disc bulge with superimposed right central disc protrusion migrating superiorly, prominent facet degenerative changes with ligamentum flavum redundancy as well as inter spinous degeneration. Findings result in severe spinal canal stenosis which appears mildly progressed from prior MRI and mild bilateral neural foraminal narrowing.  L5-S1: Left asymmetric disc bulge, facet degenerative changes ligamentum flavum redundancy resulting in narrowing of the left subarticular zone and moderate left neural foraminal narrowing.  IMPRESSION: 1. Multilevel degenerative changes of the lumbar spine as described above, mildy progressed at L4-5 where there is severe spinal canal stenosis and mild bilateral neural foraminal narrowing. 2. Mild spinal canal stenosis at L1-2, L2-3, and L3-4. 3. Severe right neural foraminal narrowing at L2-3 and moderate to severe right neural foraminal narrowing at L3-4. 4. Moderate left  neural foraminal narrowing at L5-S1. 5. Unchanged T1 hypointense/T2 hyperintense lesions within the sacrum bilaterally, only partially evaluated.  These results will be called to the ordering clinician or representative by the Radiologist Assistant, and communication documented in the PACS or [employer].  Electronically Signed   By: Pedro Earls M.D.   On: 09/25/2019 10:47 Note: Reviewed        Physical Exam  General appearance: Well nourished, well developed, and well hydrated. In no apparent acute distress Mental status: Alert, oriented x 3 (person, place, & time)       Respiratory: No evidence of acute respiratory distress Eyes: PERLA Vitals: BP (!) 123/59 (BP Location: Right Arm, Patient Position: Sitting, Cuff Size: Normal)   Pulse 67   Temp (!) 97.1 F (36.2 C) (Temporal)   Resp 18   Ht _0  (1.626 m)   Wt 204 lb (92.5 kg)   SpO2 97%   BMI 35.02 kg/m  BMI: Estimated body mass index is 35.02 kg/m as calculated from the following:   Height as of this encounter: _1  (1.626 m).   Weight as of this encounter: 204 lb (92.5 kg). Ideal: Ideal body weight: 54.7 kg (120 lb 9.5 oz) Adjusted ideal body weight: 69.8 kg (153 lb 15.3 oz)  Lumbar Spine Area Exam  Skin & Axial Inspection: No masses, redness, or swelling Alignment: Symmetrical Functional ROM: Pain restricted ROM affecting primarily the right Stability: No instability detected Muscle Tone/Strength: Functionally intact. No obvious neuro-muscular anomalies detected. Sensory (Neurological): Musculoskeletal pain pattern Palpation: No palpable anomalies       Provocative Tests: Hyperextension/rotation test: (+) on the right for facet joint pain. Lumbar quadrant test (Kemp's test): (+) on the right for facet joint pain. Lateral bending test: deferred today       Patrick's Maneuver: deferred today                   FABER* test: deferred today                   S-I anterior  distraction/compression test: deferred today  S-I lateral compression test: deferred today         S-I Thigh-thrust test: deferred today         S-I Gaenslen's test: deferred today         *(Flexion, ABduction and External Rotation)  Gait & Posture Assessment  Ambulation: Unassisted Gait: Relatively normal for age and body habitus Posture: Difficulty standing up straight, due to pain   Lower Extremity Exam    Side: Right lower extremity  Side: Left lower extremity  Stability: No instability observed          Stability: No instability observed          Skin & Extremity Inspection: Skin color, temperature, and hair growth are WNL. No peripheral edema or cyanosis. No masses, redness, swelling, asymmetry, or associated skin lesions. No contractures.  Skin & Extremity Inspection: Skin color, temperature, and hair growth are WNL. No peripheral edema or cyanosis. No masses, redness, swelling, asymmetry, or associated skin lesions. No contractures.  Functional ROM: Pain restricted ROM for hip and knee joints          Functional ROM: Unrestricted ROM                  Muscle Tone/Strength: Functionally intact. No obvious neuro-muscular anomalies detected.  Muscle Tone/Strength: Functionally intact. No obvious neuro-muscular anomalies detected.  Sensory (Neurological): Arthropathic arthralgia        Sensory (Neurological): Unimpaired        DTR: Patellar: 1+: trace Achilles: deferred today Plantar: deferred today  DTR: Patellar: 1+: trace Achilles: deferred today Plantar: deferred today  Palpation: No palpable anomalies  Palpation: No palpable anomalies     Assessment   Status Diagnosis  Persistent Persistent Persistent 1. Lumbar degenerative disc disease   2. Spondylosis without myelopathy or radiculopathy, lumbar region   3. Lumbar facet arthropathy   4. Neuropathy associated with cancer ([place])   5. Spinal stenosis, lumbar region, with neurogenic claudication   6.  Chronic pain syndrome       Plan of Care   Patient presents today to discuss interventional options for her persistent low back pain related to lumbar spinal stenosis, severe lumbar degenerative disc disease, lumbar facet arthropathy, lumbar spondylosis.  She also has significant loss of disc height in her lumbar region.  For the patient's severe spinal stenosis, we were considering minimally invasive lumbar decompression however upon reviewing fluoroscopy imaging and MRI imaging, patient has severe loss of disc height which closes off her interlaminar space making it practically impossible to obtain interlaminar access to decompress her L4-L5 ligamentum flavum.  Patient has had diagnostic lumbar facet medial branch nerve blocks in the past along with right L3-L4-L5 lumbar radiofrequency ablation on 04/14/2019, left L3-L4-L5 lumbar radiofrequency ablation on 01/16/2018.  This did provide the patient with moderate pain relief for approximately 5 to 6 months.  Now she is having increased low back pain that is worse with lumbar extension.  We discussed Sprint peripheral nerve stimulation of lumbar medial branch as a potential therapy to help with her low back pain related to facet pathology and spondylosis.  Risks and benefits were discussed in great detail and patient would like to proceed.  We will get her scheduled as below.  Orders:  Orders Placed This Encounter  Procedures  . Peripheral Nerve Stimulation    Standing Status:   Future    Standing Expiration Date:   12/03/2020    Scheduling Instructions:     Lumbar medial branch PNS L4  RIGHT first    Order Specific Question:   Where will this procedure be performed?    Answer:   [facility]  . Peripheral Nerve Stimulation    Standing Status:   Future    Standing Expiration Date:   12/03/2020    Scheduling Instructions:     SPRINT PNS lumbar medial branch     LEFT (2 weeks after right)    Order Specific Question:   Where will this  procedure be performed?    Answer:   [facility]   Follow-up plan:   Return in about 3 weeks (around 12/25/2019) for RIGHT Lumbar PNS (SPRINT).     status post left L3, L4, L5 RFA on 01/16/2018. right L3, L4, L5 RFA: 04/14/2019; successful, 85% pain relief in lower lumbar region.       Recent Visits Date Type Provider Dept  10/22/19 Office Visit Gillis Santa, MD [facility]  Showing recent visits within past 90 days and meeting all other requirements Today's Visits Date Type Provider Dept  12/04/19 Office Visit Gillis Santa, MD [facility]  Showing today's visits and meeting all other requirements Future Appointments No visits were found meeting these conditions. Showing future appointments within next 90 days and meeting all other requirements  I discussed the assessment and treatment plan with the patient. The patient was provided an opportunity to ask questions and all were answered. The patient agreed with the plan and demonstrated an understanding of the instructions.  Patient advised to call back or seek an in-person evaluation if the symptoms or condition worsens.  Duration of encounter:37mnutes.  Note by: BGillis Santa MD Date: 12/04/2019; Time: 10:55 AM

## 2019-12-04 NOTE — Patient Instructions (Signed)

## 2019-12-09 ENCOUNTER — Ambulatory Visit: Payer: Medicare PPO

## 2019-12-11 ENCOUNTER — Ambulatory Visit: Payer: Medicare PPO

## 2019-12-15 ENCOUNTER — Other Ambulatory Visit: Payer: Self-pay

## 2019-12-15 ENCOUNTER — Ambulatory Visit
Admission: RE | Admit: 2019-12-15 | Discharge: 2019-12-15 | Disposition: A | Discharge status: Home / Self Care | Payer: Medicare PPO | Source: Ambulatory Visit | Attending: Radiation Oncology | Admitting: Radiation Oncology

## 2019-12-15 ENCOUNTER — Encounter: Payer: Self-pay | Admitting: Oncology

## 2019-12-15 ENCOUNTER — Inpatient Hospital Stay: Payer: Medicare PPO | Admitting: Oncology

## 2019-12-15 ENCOUNTER — Inpatient Hospital Stay: Payer: Medicare PPO | Attending: Oncology

## 2019-12-15 VITALS — BP 129/72 | HR 57 | Temp 96.4°F | Resp 16 | Wt 213.1 lb

## 2019-12-15 DIAGNOSIS — C541 Malignant neoplasm of endometrium: Secondary | ICD-10-CM

## 2019-12-15 DIAGNOSIS — Z90722 Acquired absence of ovaries, bilateral: Secondary | ICD-10-CM | POA: Insufficient documentation

## 2019-12-15 DIAGNOSIS — Z9079 Acquired absence of other genital organ(s): Secondary | ICD-10-CM | POA: Diagnosis not present

## 2019-12-15 DIAGNOSIS — Z8542 Personal history of malignant neoplasm of other parts of uterus: Secondary | ICD-10-CM | POA: Insufficient documentation

## 2019-12-15 DIAGNOSIS — G629 Polyneuropathy, unspecified: Secondary | ICD-10-CM | POA: Diagnosis not present

## 2019-12-15 DIAGNOSIS — Z87891 Personal history of nicotine dependence: Secondary | ICD-10-CM | POA: Insufficient documentation

## 2019-12-15 DIAGNOSIS — I4891 Unspecified atrial fibrillation: Secondary | ICD-10-CM | POA: Diagnosis not present

## 2019-12-15 DIAGNOSIS — I495 Sick sinus syndrome: Secondary | ICD-10-CM | POA: Diagnosis not present

## 2019-12-15 DIAGNOSIS — M48061 Spinal stenosis, lumbar region without neurogenic claudication: Secondary | ICD-10-CM | POA: Insufficient documentation

## 2019-12-15 DIAGNOSIS — I252 Old myocardial infarction: Secondary | ICD-10-CM | POA: Diagnosis not present

## 2019-12-15 DIAGNOSIS — C55 Malignant neoplasm of uterus, part unspecified: Secondary | ICD-10-CM

## 2019-12-15 DIAGNOSIS — Z9221 Personal history of antineoplastic chemotherapy: Secondary | ICD-10-CM | POA: Insufficient documentation

## 2019-12-15 LAB — COMPREHENSIVE METABOLIC PANEL
ALT: 14 U/L (ref 0–44)
AST: 25 U/L (ref 15–41)
Albumin: 4 g/dL (ref 3.5–5.0)
Alkaline Phosphatase: 92 U/L (ref 38–126)
Anion gap: 8 (ref 5–15)
BUN: 13 mg/dL (ref 8–23)
CO2: 27 mmol/L (ref 22–32)
Calcium: 9.4 mg/dL (ref 8.9–10.3)
Chloride: 106 mmol/L (ref 98–111)
Creatinine, Ser: 0.87 mg/dL (ref 0.44–1.00)
GFR calc Af Amer: >60 mL/min (ref >60)
GFR calc non Af Amer: >60 mL/min (ref >60)
Glucose, Bld: 116 mg/dL — ABNORMAL HIGH (ref 70–99)
Potassium: 3.8 mmol/L (ref 3.5–5.1)
Sodium: 141 mmol/L (ref 135–145)
Total Bilirubin: 0.8 mg/dL (ref 0.3–1.2)
Total Protein: 7.1 g/dL (ref 6.5–8.1)

## 2019-12-15 LAB — CBC WITH DIFFERENTIAL/PLATELET
Abs Immature Granulocytes: 0.01 10*3/uL (ref 0.00–0.07)
Basophils Absolute: 0 10*3/uL (ref 0.0–0.1)
Basophils Relative: 0 %
Eosinophils Absolute: 0.1 10*3/uL (ref 0.0–0.5)
Eosinophils Relative: 2 %
HCT: 40.6 % (ref 36.0–46.0)
Hemoglobin: 13.8 g/dL (ref 12.0–15.0)
Immature Granulocytes: 0 %
Lymphocytes Relative: 19 %
Lymphs Abs: 1.2 10*3/uL (ref 0.7–4.0)
MCH: 30.7 pg (ref 26.0–34.0)
MCHC: 34 g/dL (ref 30.0–36.0)
MCV: 90.4 fL (ref 80.0–100.0)
Monocytes Absolute: 0.6 10*3/uL (ref 0.1–1.0)
Monocytes Relative: 9 %
Neutro Abs: 4.3 10*3/uL (ref 1.7–7.7)
Neutrophils Relative %: 70 %
Platelets: 222 10*3/uL (ref 150–400)
RBC: 4.49 MIL/uL (ref 3.87–5.11)
RDW: 13.3 % (ref 11.5–15.5)
WBC: 6.1 10*3/uL (ref 4.0–10.5)
nRBC: 0 % (ref 0.0–0.2)

## 2019-12-15 NOTE — Progress Notes (Signed)
[facility] day:  12/15/2019   Chief Complaint: Susan [name] is a 85 y.o. female with stage IIIC uterine cancer who is seen for 6 month assessment, establish care with me.   PERTINENT ONCOLOGY HISTORY Susan [name] is a 85 y.o.afemale who has above oncology history reviewed by me today presented for follow up visit for history of stage IIIc uterine cancer.  Patient previously followed up with Dr. Mike Gip, switched care to me from 22 2020. Extensive medical records review was performed.  stage IIIC endometrial cancer s/p TLH/BSO and staging on 04/08/2015 at [facility].   Pathology staging was T1 aN1 M0. 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  Tumor invaded 2 mm into a 14 mm thick myometrium.  There was no cervical involvement.   There was lymphovascular invasion.  Washings were negative.  There were 3 positive lymph nodes in the pelvis (left external iliac node: 1 cm focus without extranodal extension; less than 0.1 mm focus in 1 external right iliac node and a 0.5 mm focus in another right external iliac node).  #Status post 2 cycles of carboplatin and Taxol(05/12/2015 - 06/02/2015).  Did not receive planned chemotherapy cycles due to debilitating neuropathy, per note, she received adjuvant 4500 cGy pelvic radiation and 1200 cGy using high dose rate remote afterloading through vaginal cylinder (completed 10/18/2015).  Abdomen and pelvic CT on 08/31/2016 revealed no evidence of recurrent malignancy in the abdomen or pelvis.   Sacral insufficiency fracture in the bilateral sacrum. Abdominal and pelvic CT on 12/12/2017 revealed no evidence of recurrence or metastatic disease.  Small nodule was noted on CTs. Had chest CT on 12/05/2016 which reviewed scattered right lung nodules measuring up to 6 mm in the right upper lobe along the minor fissure.  Metastatic disease was not excluded. Repeat CT on  03/06/2017 reviewed the stability of tiny subcentimeter lung nodules. CT chest on 09/04/2017 reviewed and no acute cardiopulmonary abnormalities.  A small pulmonary nodules were stable.  Largest nodule was 6 mm.  CA125 has been followed:  82.5 on 04/28/2015, 22.0 on 06/02/2015, 18.2 on 07/14/2015, 11.7 on 10/21/2015, 16.4 on 01/21/2016, 14.2 on 04/25/2016, 13.6 on 08/15/2016, 16.2 on 12/07/2016, 16.1 on 03/08/2017, 13.8 on 09/13/2017, and 13.2 on 03/15/2018.  # Lumbar spine MRI on 12/12/2017 revealed severe stenosis at L4-5 related to 5 mm slip, central disc extrusion with cephalad migrated free fragment. Posterior element hypertrophy is superimposed. BILATERAL L5 neural impingement along with RIGHT L4 neural foraminal narrowing was noted. Similar less severe changes at L1-2, L2-3, L3-4, and L5-S1.  There was no evidence of metastatic disease.  #Neuropathy, on Neurontin treatments.  Today patient reports feeling well at baseline.  No new complaints or symptoms since last visit. She states that the radiation has fractured her spine and damaged her bladder.  She has bladder incontinence, wearing diapers.  She is chronically bradycardic, sick sinus syndrome.  Today's heart rate 53.  History of atrial fibrillation.  Says she follows up with her " electrician"-cardiologist to revisit discussions regarding pacemaker placement. Reports asymptomatic.  Denies any dizziness today.  Denies weight loss, fever, chills, fatigue, night sweats.  Recently been seen by Dr.Berchuck. note reviewed.   INTERVAL HISTORY Susan [name] is a 85 y.o. female who has above history reviewed by me today presents for follow up visit for management of  history of endometrial cancer. Problems and complaints are listed below: Patient was seen by GYN  oncology Dr. Fransisca Connors in June 2021.  Notes were reviewed.  Normal pelvic exam. Patient denies any constitutional symptoms. He continues to have chronic back pain due to severe  lumbar spine stenosis.  She also has chronic bilateral lower extremity neuropathy.  She had MRI done on 09/24/2019 which showed multilevel degenerative disease of the lumbar spine.  No metastatic cancer in the bone.  She has unchanged T1 hypointense/T2 hyperintense lesion within the sacrum bilaterally. She follows up with Dr. Holley Raring for pain management.   Review of Systems  Constitutional: Negative for appetite change, chills, fatigue and fever.  HENT:   Negative for hearing loss and voice change.   Eyes: Negative for eye problems.  Respiratory: Negative for chest tightness and cough.   Cardiovascular: Negative for chest pain.       "chronic Afrib and low heart beats ."  Gastrointestinal: Negative for abdominal distention, abdominal pain and blood in stool.  Endocrine: Negative for hot flashes.  Genitourinary: Negative for difficulty urinating and frequency.   Musculoskeletal: Negative for arthralgias.       Chronic back pain  Skin: Negative for itching and rash.  Neurological: Positive for numbness. Negative for extremity weakness.  Hematological: Negative for adenopathy.  Psychiatric/Behavioral: Negative for confusion.    Past Medical History:  Diagnosis Date  . Bradycardia   . Chronic back pain   . Coronary artery disease    a. s/p STEMI in 2014 with DES to LAD with staged PCI/DES placement to LCx  . Echoencephalogram abnormality   . Heart murmur   . History of Holter monitoring   . Hyperlipidemia   . Hypertension   . Myocardial infarction ([place])   . Neuromuscular disorder (H[facility])    neuropathy  . NSVT (nonsustained ventricular tachycardia) ([place])   . Personal history of chemotherapy    ENDOMETRIAL CA  . Personal history of radiation therapy    ENDOMETRIAL CA  . Uterine cancer ([place])    serrous    Past Surgical History:  Procedure Laterality Date  . ABDOMINAL HYSTERECTOMY    . BREAST EXCISIONAL BIOPSY Right 1956   NEG  . CARDIOVERSION N/A 07/27/2017   Procedure:  CARDIOVERSION;  Surgeon: Skeet Latch, MD;  Location: [place];  Service: Cardiovascular;  Laterality: N/A;  . LEFT HEART CATHETERIZATION WITH CORONARY ANGIOGRAM  10/19/2012   Procedure: LEFT HEART CATHETERIZATION WITH CORONARY ANGIOGRAM;  Surgeon: Troy Sine, MD;  Location: [facility] CATH LAB;  Service: Cardiovascular;;  . PERCUTANEOUS CORONARY STENT INTERVENTION (PCI-S)  10/19/2012   Procedure: PERCUTANEOUS CORONARY STENT INTERVENTION (PCI-S);  Surgeon: Troy Sine, MD;  Location: [facility] CATH LAB;  Service: Cardiovascular;;  . PERCUTANEOUS CORONARY STENT INTERVENTION (PCI-S) N/A 10/23/2012   Procedure: PERCUTANEOUS CORONARY STENT INTERVENTION (PCI-S);  Surgeon: Troy Sine, MD;  Location: [facility] CATH LAB;  Service: Cardiovascular;  Laterality: N/A;  . PERIPHERAL VASCULAR CATHETERIZATION N/A 05/05/2015   Procedure: Glori Luis Cath Insertion;  Surgeon: Algernon Huxley, MD;  Location: [place] CV LAB;  Service: Cardiovascular;  Laterality: N/A;  . PORTA CATH REMOVAL N/A 04/05/2017   Procedure: PORTA CATH REMOVAL;  Surgeon: Algernon Huxley, MD;  Location: [place] CV LAB;  Service: Cardiovascular;  Laterality: N/A;    Family History  Problem Relation Age of Onset  . Heart disease Mother   . Breast cancer Mother 60  . Heart disease Father   . Diabetes Father   . Diabetes Brother   . Stroke Brother   . Kidney cancer Neg Hx   .  Bladder Cancer Neg Hx     Social History   Socioeconomic History  . Marital status: Widowed    Spouse name: Not on file  . Number of children: Not on file  . Years of education: Not on file  . Highest education level: Not on file  Occupational History  . Not on file  Tobacco Use  . Smoking status: Former Smoker    Quit date: 1976    Years since quitting: 45.7  . Smokeless tobacco: Never Used  Vaping Use  . Vaping Use: Never used  Substance and Sexual Activity  . Alcohol use: Yes    Alcohol/week: 1.0 standard drink    Types: 1 Glasses of wine per week  .  Drug use: No  . Sexual activity: Not Currently  Other Topics Concern  . Not on file  Social History Narrative  . Not on file   Social Determinants of Health   Financial Resource Strain:   . Difficulty of Paying Living Expenses: Not on file  Food Insecurity:   . Worried About Charity fundraiser in the Last Year: Not on file  . Ran Out of Food in the Last Year: Not on file  Transportation Needs:   . Lack of Transportation (Medical): Not on file  . Lack of Transportation (Non-Medical): Not on file  Physical Activity:   . Days of Exercise per Week: Not on file  . Minutes of Exercise per Session: Not on file  Stress:   . Feeling of Stress : Not on file  Social Connections:   . Frequency of Communication with Friends and Family: Not on file  . Frequency of Social Gatherings with Friends and Family: Not on file  . Attends Religious Services: Not on file  . Active Member of Clubs or Organizations: Not on file  . Attends Archivist Meetings: Not on file  . Marital Status: Not on file  Intimate Partner Violence:   . Fear of Current or Ex-Partner: Not on file  . Emotionally Abused: Not on file  . Physically Abused: Not on file  . Sexually Abused: Not on file   She is originally from [place].  She lives in [place].  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  She and her husband will be married 81 years in 11/2016.  Her husband's name is [name].  She has a trip planned in August (10 day cruise). The patient is alone today.  Allergies:  Allergies  Allergen Reactions  . Ace Inhibitors Cough  . Penicillins Rash and Other (See Comments)    Has patient had a PCN reaction causing immediate rash, facial/tongue/throat swelling, SOB or lightheadedness with hypotension: No Has patient had a PCN reaction causing severe rash involving mucus membranes or skin necrosis: No Has patient had a PCN reaction that required hospitalization: No Has patient had a PCN  reaction occurring within the last 10 years: No If all of the above answers are "NO", then may proceed with Cephalosporin use.     Current Medications: Current Outpatient Medications  Medication Sig Dispense Refill  . acetaminophen (TYLENOL) 650 MG CR tablet Take 650 mg by mouth every 8 (eight) hours as needed for pain.    [name] aspirin EC 81 MG tablet Take 81 mg by mouth daily.    [name] atorvastatin (LIPITOR) 80 MG tablet TAKE 1 TABLET (80 MG TOTAL) BY MOUTH DAILY AT 6 PM. 90 tablet 3  . ELIQUIS 5 MG TABS  tablet TAKE 1 TABLET BY MOUTH TWICE A DAY 180 tablet 1  . ezetimibe (ZETIA) 10 MG tablet TAKE 1 TABLET BY MOUTH EVERY DAY 90 tablet 1  . fluticasone (FLONASE) 50 MCG/ACT nasal spray SPRAY 2 SPRAYS INTO EACH NOSTRIL EVERY DAY    . isosorbide mononitrate (IMDUR) 30 MG 24 hr tablet Take 1 tablet (30 mg total) by mouth daily. 90 tablet 3  . losartan (COZAAR) 25 MG tablet TAKE 2 TABLETS BY MOUTH EVERY DAY 180 tablet 1  . mirabegron ER (MYRBETRIQ) 50 MG TB24 tablet Take 50 mg by mouth daily.    . Multiple Vitamin (MULTIVITAMIN WITH MINERALS) TABS Take 1 tablet by mouth daily.    [name] NITROSTAT 0.4 MG SL tablet PLACE 1 TABLET UNDER THE TOUGUE EVERY 5 MINUTES FOR UP TO 3 DOSES AS NEEDED FOR CHEST PAIN 25 tablet 0  . nortriptyline (PAMELOR) 10 MG capsule Take 10 mg by mouth at bedtime.    [name] omeprazole (PRILOSEC) 20 MG capsule Take 20 mg by mouth daily.    . vitamin B-12 (CYANOCOBALAMIN) 1000 MCG tablet Take 1,000 mcg by mouth daily.    [name] loperamide (IMODIUM A-D) 2 MG tablet Take 2 mg by mouth 4 (four) times daily as needed for diarrhea or loose stools. (Patient not taking: Reported on 12/15/2019)    . tiZANidine (ZANAFLEX) 4 MG tablet Take 4 mg by mouth at bedtime. (Patient not taking: Reported on 12/15/2019)     No current facility-administered medications for this visit.   Facility-Administered Medications Ordered in Other Visits  Medication Dose Route Frequency Provider Last Rate Last Admin  . sodium  chloride flush (NS) 0.9 % injection 10 mL  10 mL Intravenous PRN Cammie Sickle, MD   10 mL at 06/06/16 1447  . sodium chloride flush (NS) 0.9 % injection 10 mL  10 mL Intravenous PRN Cammie Sickle, MD   10 mL at 08/15/16 1404       Physical Exam:  Blood pressure 129/72, pulse (!) 57, temperature (!) 96.4 F (35.8 C), resp. rate 16, weight 213 lb 1.6 oz (96.7 kg). Physical Exam Constitutional:      General: She is not in acute distress.    Appearance: She is not diaphoretic.     Comments: Obese.   HENT:     Head: Normocephalic and atraumatic.     Nose: Nose normal.     Mouth/Throat:     Pharynx: No oropharyngeal exudate.  Eyes:     General: No scleral icterus.    Pupils: Pupils are equal, round, and reactive to light.  Cardiovascular:     Rate and Rhythm: Normal rate and regular rhythm.     Heart sounds: No murmur heard.   Pulmonary:     Effort: Pulmonary effort is normal. No respiratory distress.     Breath sounds: No rales.  Chest:     Chest wall: No tenderness.  Abdominal:     General: There is no distension.     Palpations: Abdomen is soft.     Tenderness: There is no abdominal tenderness.  Musculoskeletal:        General: Normal range of motion.     Cervical back: Normal range of motion and neck supple.  Skin:    General: Skin is warm and dry.     Findings: No erythema.  Neurological:     Mental Status: She is alert and oriented to person, place, and time.     Cranial Nerves: No cranial  nerve deficit.     Motor: No abnormal muscle tone.     Coordination: Coordination normal.  Psychiatric:        Mood and Affect: Affect normal.     Appointment on 12/15/2019  Component Date Value Ref Range Status  . Sodium 12/15/2019 141  135 - 145 mmol/L Final  . Potassium 12/15/2019 3.8  3.5 - 5.1 mmol/L Final  . Chloride 12/15/2019 106  98 - 111 mmol/L Final  . CO2 12/15/2019 27  22 - 32 mmol/L Final  . Glucose, Bld 12/15/2019 116* 70 - 99 mg/dL Final    Glucose reference range applies only to samples taken after fasting for at least 8 hours.  . BUN 12/15/2019 13  8 - 23 mg/dL Final  . Creatinine, Ser 12/15/2019 0.87  0.44 - 1.00 mg/dL Final  . Calcium 12/15/2019 9.4  8.9 - 10.3 mg/dL Final  . Total Protein 12/15/2019 7.1  6.5 - 8.1 g/dL Final  . Albumin 12/15/2019 4.0  3.5 - 5.0 g/dL Final  . AST 12/15/2019 25  15 - 41 U/L Final  . ALT 12/15/2019 14  0 - 44 U/L Final  . Alkaline Phosphatase 12/15/2019 92  38 - 126 U/L Final  . Total Bilirubin 12/15/2019 0.8  0.3 - 1.2 mg/dL Final  . GFR calc non Af Amer 12/15/2019 >60  >60 mL/min Final  . GFR calc Af Amer 12/15/2019 >60  >60 mL/min Final  . Anion gap 12/15/2019 8  5 - 15 Final   Performed at [facility], [address] [place], [place] [address]  . WBC 12/15/2019 6.1  4.0 - 10.5 K/uL Final  . RBC 12/15/2019 4.49  3.87 - 5.11 MIL/uL Final  . Hemoglobin 12/15/2019 13.8  12.0 - 15.0 g/dL Final  . HCT 12/15/2019 40.6  36 - 46 % Final  . MCV 12/15/2019 90.4  80.0 - 100.0 fL Final  . MCH 12/15/2019 30.7  26.0 - 34.0 pg Final  . MCHC 12/15/2019 34.0  30.0 - 36.0 g/dL Final  . RDW 12/15/2019 13.3  11.5 - 15.5 % Final  . Platelets 12/15/2019 222  150 - 400 K/uL Final  . nRBC 12/15/2019 0.0  0.0 - 0.2 % Final  . Neutrophils Relative % 12/15/2019 70  % Final  . Neutro Abs 12/15/2019 4.3  1.7 - 7.7 K/uL Final  . Lymphocytes Relative 12/15/2019 19  % Final  . Lymphs Abs 12/15/2019 1.2  0.7 - 4.0 K/uL Final  . Monocytes Relative 12/15/2019 9  % Final  . Monocytes Absolute 12/15/2019 0.6  0 - 1 K/uL Final  . Eosinophils Relative 12/15/2019 2  % Final  . Eosinophils Absolute 12/15/2019 0.1  0 - 0 K/uL Final  . Basophils Relative 12/15/2019 0  % Final  . Basophils Absolute 12/15/2019 0.0  0 - 0 K/uL Final  . Immature Granulocytes 12/15/2019 0  % Final  . Abs Immature Granulocytes 12/15/2019 0.01  0.00 - 0.07 K/uL Final   Performed at [facility], [address], [place],  [place] [address]    Assessment:  Susan [name] is a 85 y.o. female with history of stage IIIc uterus cancer presents for follow-up. 1. Carcinoma of endometrium ([place])   2. Neuropathy    #Stage IIIc uterine cancer, status post surgery and 2 cycles of adjuvant chemotherapy and adjuvant radiation. From oncology standpoint view, she has been doing well. She will also see Dr. Baruch Gouty today. CA-125 is pending. Labs are reviewed and discussed  with patient.  # neuropathy/radiculopathy, continue follow-up with neurology Dr. Gurney Maxin  We spent sufficient time to discuss many aspect of care, questions were answered to patient's satisfaction. She should also continue follow-up with gynecology oncology and radiation oncology.  Patient will follow up with me as needed if she develops related symptoms or concerns.  Earlie Server, MD  12/15/2019, 5:21 PM

## 2019-12-15 NOTE — Progress Notes (Signed)
Radiation Oncology Follow up Note  Name: Susan [name]   Date:   12/15/2019 MRN:  [id number] DOB: 1935/02/20    This 85 y.o. female presents to the clinic today for 4-year follow-up status post concurrent chemoradiation therapy for stage IIIc mixed grade serous endometrial uterine carcinoma status post TLH BSO.[name]  REFERRING PROVIDER: [name], MD  HPI: Patient is an 85 year old female now out for years having received both chemotherapy and radiation therapy for stage IIIc serous endometrial carcinoma.  She had adjuvant carbotaxol and pelvic radiation therapy as well as vaginal brachytherapy.  Her CA-125 normalized.  She has some significant side effects from her treatment including urinary incontinence which is she has had Botox for which is improved that dramatically.  She also has peripheral neuropathy has a left foot drop and has completed physical therapy for that..  She had a pelvic exam back in June showing no evidence of disease.  Patient is also had some significant problems with back pain MRI of her lumbar spine shows multiple degenerative changes of the lumbar spine with severe spinal canal stenosis at L4-5.  They are planning to do a TENS type of treatment later this week to help control her pain.  Patient also had a screening mammography back in March which I have reviewed was BI-RADS 1 negative.  COMPLICATIONS OF TREATMENT: none  FOLLOW UP COMPLIANCE: keeps appointments   PHYSICAL EXAM:  There were no vitals taken for this visit. Well-developed well-nourished patient in NAD. HEENT reveals PERLA, EOMI, discs not visualized.  Oral cavity is clear. No oral mucosal lesions are identified. Neck is clear without evidence of cervical or supraclavicular adenopathy. Lungs are clear to A&P. Cardiac examination is essentially unremarkable with regular rate and rhythm without murmur rub or thrill. Abdomen is benign with no organomegaly or masses noted. Motor sensory and DTR levels are  equal and symmetric in the upper and lower extremities. Cranial nerves II through XII are grossly intact. Proprioception is intact. No peripheral adenopathy or edema is identified. No motor or sensory levels are noted. Crude visual fields are within normal range.  RADIOLOGY RESULTS: Lumbar spine MRI and screening bilateral breast mammograms reviewed  PLAN: Current time patient continues to do well with no evidence of disease from her locally advanced stage serous endometrial carcinoma.  I am return follow-up care over to Dr. Fransisca Connors.  I be happy to reevaluate the patient anytime should further treatment be indicated.  I would like to take this opportunity to thank you for allowing me to participate in the care of your patient.Noreene Filbert, MD

## 2019-12-15 NOTE — Progress Notes (Signed)
Patient still has neuropathy and has been evaluated by neurologist and PT.

## 2019-12-16 ENCOUNTER — Ambulatory Visit: Payer: Medicare PPO

## 2019-12-16 ENCOUNTER — Telehealth: Payer: Self-pay | Admitting: *Deleted

## 2019-12-16 LAB — CA 125: Cancer Antigen (CA) 125: 16.5 U/mL (ref 0.0–38.1)

## 2019-12-16 NOTE — Telephone Encounter (Signed)
Patient notified per voicemail to stop Eliquis 3 days before procedure.

## 2019-12-17 ENCOUNTER — Ambulatory Visit: Payer: Medicare Other | Admitting: Radiation Oncology

## 2019-12-18 ENCOUNTER — Ambulatory Visit: Payer: Medicare PPO

## 2019-12-22 ENCOUNTER — Ambulatory Visit
Admission: RE | Admit: 2019-12-22 | Discharge: 2019-12-22 | Disposition: A | Discharge status: Home / Self Care | Payer: Medicare PPO | Source: Ambulatory Visit | Attending: Student in an Organized Health Care Education/Training Program | Admitting: Student in an Organized Health Care Education/Training Program

## 2019-12-22 ENCOUNTER — Other Ambulatory Visit: Payer: Self-pay

## 2019-12-22 ENCOUNTER — Ambulatory Visit (HOSPITAL_BASED_OUTPATIENT_CLINIC_OR_DEPARTMENT_OTHER): Payer: Medicare PPO | Admitting: Student in an Organized Health Care Education/Training Program

## 2019-12-22 ENCOUNTER — Encounter: Payer: Self-pay | Admitting: Student in an Organized Health Care Education/Training Program

## 2019-12-22 DIAGNOSIS — G894 Chronic pain syndrome: Secondary | ICD-10-CM | POA: Insufficient documentation

## 2019-12-22 DIAGNOSIS — M5136 Other intervertebral disc degeneration, lumbar region: Secondary | ICD-10-CM

## 2019-12-22 DIAGNOSIS — M47816 Spondylosis without myelopathy or radiculopathy, lumbar region: Secondary | ICD-10-CM

## 2019-12-22 MED ORDER — LIDOCAINE HCL 2 % IJ SOLN
INTRAMUSCULAR | Status: AC
  Filled 2019-12-22: qty 20

## 2019-12-22 MED ORDER — LIDOCAINE HCL 2 % IJ SOLN
20.0000 mL | Freq: Once | INTRAMUSCULAR | Status: AC
  Administered 2019-12-22: 400 mg

## 2019-12-22 NOTE — Progress Notes (Signed)
PROVIDER NOTE: Information contained herein reflects review and annotations entered in association with encounter. Interpretation of such information and data should be left to medically-trained personnel. Information provided to patient can be located elsewhere in the medical record under "Patient Instructions". Document created using STT-dictation technology, any transcriptional errors that may result from process are unintentional.    Patient: Susan [name]  Service Category: Procedure  Provider: Gillis Santa, MD  DOB: 1935-03-04  DOS: 12/22/2019  Location: [facility]  MRN: [id number]  Setting: Ambulatory - outpatient  Referring Provider: Gillis Santa, MD  Type: Established Patient  Specialty: Interventional Pain Management  PCP: [name], MD   Primary Reason for Visit: Interventional Pain Management Treatment. CC: Back Pain (lumbar bilateral )  Procedure:          Anesthesia, Analgesia, Anxiolysis:  Sprint peripheral nerve stimulation of right L4 medial branch nerve for low back pain related to lumbar facet arthropathy, lumbar degenerative disc disease.  Type: Local Anesthesia  Local Anesthetic: Lidocaine 1-2%  Position: Prone   Indications: 1. Lumbar degenerative disc disease   2. Spondylosis without myelopathy or radiculopathy, lumbar region   3. Lumbar facet arthropathy   4. Chronic pain syndrome    Pain Score: Pre-procedure: 5 /10 Post-procedure: 3 /10   Patient stopped Eliquis 3 days prior to scheduled procedure.  Last dose was Friday.  Pre-op Assessment:  Susan [name] is a 85 y.o. (year old), female patient, seen today for interventional treatment. She  has a past surgical history that includes left heart catheterization with coronary angiogram (10/19/2012); percutaneous coronary stent intervention (pci-s) (10/19/2012); percutaneous coronary stent intervention (pci-s) (N/A, 10/23/2012); Abdominal hysterectomy; Cardiac catheterization (N/A, 05/05/2015);  Breast excisional biopsy (Right, 1956); PORTA CATH REMOVAL (N/A, 04/05/2017); and Cardioversion (N/A, 07/27/2017). Susan [name] has a current medication list which includes the following prescription(s): acetaminophen, vitamin c, aspirin ec, atorvastatin, eliquis, ezetimibe, fluticasone, isosorbide mononitrate, losartan, mirabegron er, multivitamin with minerals, nitrostat, nortriptyline, omeprazole, vitamin b-12, loperamide, and tizanidine, and the following Facility-Administered Medications: sodium chloride flush and sodium chloride flush. Her primarily concern today is the Back Pain (lumbar bilateral )  Initial Vital Signs:  Pulse/HCG Rate: 70  Temp: (!) 97.2 F (36.2 C) Resp: 16 BP: (!) 146/88 SpO2: 98 %  BMI: Estimated body mass index is 36.56 kg/m as calculated from the following:   Height as of this encounter: 5\' 4"  (1.626 m).   Weight as of this encounter: 213 lb (96.6 kg).  Risk Assessment: Allergies: Reviewed. She is allergic to ace inhibitors and penicillins.  Allergy Precautions: None required Coagulopathies: Reviewed. None identified.  Blood-thinner therapy: None at this time Active Infection(s): Reviewed. None identified. Susan [name] is afebrile  Site Confirmation: Susan [name] was asked to confirm the procedure and laterality before marking the site Procedure checklist: Completed Consent: Before the procedure and under the influence of no sedative(s), amnesic(s), or anxiolytics, the patient was informed of the treatment options, risks and possible complications. To fulfill our ethical and legal obligations, as recommended by the American Medical Association's Code of Ethics, I have informed the patient of my clinical impression; the nature and purpose of the treatment or procedure; the risks, benefits, and possible complications of the intervention; the alternatives, including doing nothing; the risk(s) and benefit(s) of the alternative treatment(s) or procedure(s); and the  risk(s) and benefit(s) of doing nothing. The patient was provided information about the general risks and possible complications associated with the procedure. These may include, but are not limited to: failure  to achieve desired goals, infection, bleeding, organ or nerve damage, allergic reactions, paralysis, and death. In addition, the patient was informed of those risks and complications associated to Spine-related procedures, such as failure to decrease pain; infection (i.e.: Meningitis, epidural or intraspinal abscess); bleeding (i.e.: epidural hematoma, subarachnoid hemorrhage, or any other type of intraspinal or peri-dural bleeding); organ or nerve damage (i.e.: Any type of peripheral nerve, nerve root, or spinal cord injury) with subsequent damage to sensory, motor, and/or autonomic systems, resulting in permanent pain, numbness, and/or weakness of one or several areas of the body; allergic reactions; (i.e.: anaphylactic reaction); and/or death. Furthermore, the patient was informed of those risks and complications associated with the medications. These include, but are not limited to: allergic reactions (i.e.: anaphylactic or anaphylactoid reaction(s)); adrenal axis suppression; blood sugar elevation that in diabetics may result in ketoacidosis or comma; water retention that in patients with history of congestive heart failure may result in shortness of breath, pulmonary edema, and decompensation with resultant heart failure; weight gain; swelling or edema; medication-induced neural toxicity; particulate matter embolism and blood vessel occlusion with resultant organ, and/or nervous system infarction; and/or aseptic necrosis of one or more joints. Finally, the patient was informed that Medicine is not an exact science; therefore, there is also the possibility of unforeseen or unpredictable risks and/or possible complications that may result in a catastrophic outcome. The patient indicated having  understood very clearly. We have given the patient no guarantees and we have made no promises. Enough time was given to the patient to ask questions, all of which were answered to the patient's satisfaction. Susan [name] has indicated that she wanted to continue with the procedure. Attestation: I, the ordering provider, attest that I have discussed with the patient the benefits, risks, side-effects, alternatives, likelihood of achieving goals, and potential problems during recovery for the procedure that I have provided informed consent. Date  Time: 12/22/2019  8:36 AM  Pre-Procedure Preparation:  Monitoring: As per clinic protocol. Respiration, ETCO2, SpO2, BP, heart rate and rhythm monitor placed and checked for adequate function Safety Precautions: Patient was assessed for positional comfort and pressure points before starting the procedure. Time-out: I initiated and conducted the "Time-out" before starting the procedure, as per protocol. The patient was asked to participate by confirming the accuracy of the "Time Out" information. Verification of the correct person, site, and procedure were performed and confirmed by me, the nursing staff, and the patient. "Time-out" conducted as per Joint Commission's Universal Protocol (UP.01.01.01). Time: 0919  Description of Procedure:          Laterality: Right Levels:  L4,  Medial Branch Nerve Area Prepped: Posterior Lumbosacral Region DuraPrep (Iodine Povacrylex [0.7% available iodine] and Isopropyl Alcohol, 74% w/w)  After the risks, benefits and alternatives were discussed  with the patient and informed consent was obtained, patient  was placed in the prone position and padded to foster comfort.  Prior to delivery of anesthetics, the painful region was carefully  outlined with a marker. Appropriate skin and bony landmarks  were identified, and pertinent vascular structures were located.  The skin overlying the lumbosacral spine was prepped and  draped  in sterile fashion. Fluoroscopy was used to identify the spinous process and lamina in the center of the  patient's region of pain. After identifying and marking the intended target along the course of the medial branch nerve,  the skin around the planned entry point and the subcutaneous tissues are injected with local anesthetic.  An introducer  needle and stimulating probe were  assembled, inserted and advanced along the intended course of the medial branch nerve as it traverses the lamina medial and inferior to the zygapophyseal joint, taking care to maintain the proper depth of insertion as the introducer is advanced under fluoroscopic. The introducer needle was delivered to a location in proximity to the nerve.  Multiple stimulation parameters were used to deliver stimulation to the medial branch nerve in concert with stimulating at multiple positions around the nerve. Nerve target  acquisition was confirmed noting generation of paresthesias in the paravertebral regions corresponding to the level being stimulated as well as rhythmic thumping within the multifidi, the  latter being further corroborated via palpation.   Various electrical parameter combinations were tested, and the lead location was adjusted (physically relocated) until the patient  indicated paresthesia or muscle tension overlapping the distribution of the patient's typical region of pain.   The stimulating probe was removed from the introducer and a percutaneous lead was guided through the needle and delivered to a location in similar proximity to the nerve. Final  location was verified with electrical stimulation and documented with fluoroscopy & ultrasound.   The introducer needle was removed, and the exposed end of the percutaneous lead was attached to an external stimulator unit. Various electrical parameter combinations were again  tested until the patient indicated paresthesia or muscle tension  overlapping the  distribution of the patient's typical region of pain.   After confirming that lead impedance was in the normal range, the external unit was detached, the needle was removed, and the lead was anchored at the skin.  The lead was threaded into the connector block and electrical continuity and desired patient response was confirmed. The connector block was attached to the external  stimulator unit.The site was covered with a sterile occlusive dressing and  a fluoroscopic and ultrasound image was taken to document final placement. The patient was observed for stability of vital signs and comfort.   Once the entire procedure was completed, the treated area was cleaned, making sure to leave some of the prepping solution back to take advantage of its long term bactericidal properties.   Illustration of the posterior view of the lumbar spine and the posterior neural structures. Laminae of L2 through S1 are labeled. DPRL5, dorsal primary ramus of L5; DPRS1, dorsal primary ramus of S1; DPR3, dorsal primary ramus of L3; FJ, facet (zygapophyseal) joint L3-L4; I, inferior articular process of L4; LB1, lateral branch of dorsal primary ramus of L1; IAB, inferior articular branches from L3 medial branch (supplies L4-L5 facet joint); IBP, intermediate branch plexus; MB3, medial branch of dorsal primary ramus of L3; NR3, third lumbar nerve root; S, superior articular process of L5; SAB, superior articular branches from L4 (supplies L4-5 facet joint also); TP3, transverse process of L3.  Vitals:   12/22/19 0910 12/22/19 0920 12/22/19 0930 12/22/19 0939  BP: (!) 141/89 (!) 138/97 (!) 149/85 (!) 151/88  Pulse: 65 66 74 75  Resp: 14 16 17 16   Temp:      TempSrc:      SpO2: 99% 98% 97% 98%  Weight:      Height:         Start Time: 0919 hrs. End Time: 0938 hrs.  Imaging Guidance (Spinal):          Type of Imaging Technique: Fluoroscopy Guidance (Spinal) and ultrasound guidance to confirm multifidus  activation Indication(s): Assistance in needle guidance and placement for procedures requiring needle placement  in or near specific anatomical locations not easily accessible without such assistance. Exposure Time: Please see nurses notes. Contrast: None used. Fluoroscopic Guidance: I was personally present during the use of fluoroscopy. "Tunnel Vision Technique" used to obtain the best possible view of the target area. Parallax error corrected before commencing the procedure. "Direction-depth-direction" technique used to introduce the needle under continuous pulsed fluoroscopy. Once target was reached, antero-posterior, oblique, and lateral fluoroscopic projection used confirm needle placement in all planes. Images permanently stored in EMR. Interpretation: No contrast injected. I personally interpreted the imaging intraoperatively. Adequate needle placement confirmed in multiple planes. Permanent images saved into the patient's record.  Antibiotic Prophylaxis:   Anti-infectives (From admission, onward)   None     Indication(s): None identified  Post-operative Assessment:  Post-procedure Vital Signs:  Pulse/HCG Rate: 75  Temp: (!) 97.2 F (36.2 C) Resp: 16 BP: (!) 151/88 SpO2: 98 %  EBL: None  Complications: No immediate post-treatment complications observed by team, or reported by patient.  Note: The patient tolerated the entire procedure well. A repeat set of vitals were taken after the procedure and the patient was kept under observation following institutional policy, for this type of procedure. Post-procedural neurological assessment was performed, showing return to baseline, prior to discharge. The patient was provided with post-procedure discharge instructions, including a section on how to identify potential problems. Should any problems arise concerning this procedure, the patient was given instructions to immediately contact us, at any time, without hesitation. In any case, we  plan to contact the patient by telephone for a follow-up status report regarding this interventional procedure.  Comments:  No additional relevant information.  Plan of Care  Orders:  Orders Placed This Encounter  Procedures  . Peripheral Nerve Stimulation    Left L4 Stop Eliquis 3 days prior    Standing Status:   Future    Standing Expiration Date:   12/21/2020    Scheduling Instructions:     SPRINT PNS lumbar medial branch    Order Specific Question:   Where will this procedure be performed?    Answer:   [facility]  . [facility] C-ARM 1-60 MIN NO REPORT    Intraoperative interpretation by procedural physician at [place].    Standing Status:   Standing    Number of Occurrences:   1    Order Specific Question:   Reason for exam:    Answer:   Assistance in needle guidance and placement for procedures requiring needle placement in or near specific anatomical locations not easily accessible without such assistance.   Patient instructed to restart Eliquis tomorrow so long as she has not had any lower extremity weakness.  Instructed to stop 3 days prior to her procedure on the left.  Medications ordered for procedure: Meds ordered this encounter  Medications  . lidocaine (XYLOCAINE) 2 % (with pres) injection 400 mg   Medications administered: We administered lidocaine.  See the medical record for exact dosing, route, and time of administration.  Follow-up plan:   Return in about 2 weeks (around 01/05/2020) for left L4 PNS trial SPINT.      status post left L3, L4, L5 RFA on 01/16/2018. right L3, L4, L5 RFA: 04/14/2019; successful, 85% pain relief in lower lumbar region.  Right L4 Sprint peripheral nerve stimulation 12/22/2019.      Recent Visits Date Type Provider Dept  12/04/19 Office Visit Gillis Santa, MD [facility]  10/22/19 Office Visit Gillis Santa, MD  [facility]  Showing recent visits within past 90 days and meeting all  other requirements Today's Visits Date Type Provider Dept  12/22/19 Procedure visit Gillis Santa, MD [facility]  Showing today's visits and meeting all other requirements Future Appointments Date Type Provider Dept  01/05/20 Appointment Gillis Santa, MD [facility]  Showing future appointments within next 90 days and meeting all other requirements  Disposition: Discharge home  Discharge (Date  Time): 12/22/2019; 1016 hrs.   Primary Care Physician: [name], MD Location: [facility] Note by: Gillis Santa, MD Date: 12/22/2019; Time: 11:23 AM  Disclaimer:  Medicine is not an exact science. The only guarantee in medicine is that nothing is guaranteed. It is important to note that the decision to proceed with this intervention was based on the information collected from the patient. The Data and conclusions were drawn from the patient's questionnaire, the interview, and the physical examination. Because the information was provided in large part by the patient, it cannot be guaranteed that it has not been purposely or unconsciously manipulated. Every effort has been made to obtain as much relevant data as possible for this evaluation. It is important to note that the conclusions that lead to this procedure are derived in large part from the available data. Always take into account that the treatment will also be dependent on availability of resources and existing treatment guidelines, considered by other Pain Management Practitioners as being common knowledge and practice, at the time of the intervention. For Medico-Legal purposes, it is also important to point out that variation in procedural techniques and pharmacological choices are the acceptable norm. The indications, contraindications, technique, and results of the above procedure should only be interpreted and judged by a Board-Certified Interventional Pain Specialist with extensive  familiarity and expertise in the same exact procedure and technique.

## 2019-12-22 NOTE — Progress Notes (Signed)
Safety precautions to be maintained throughout the outpatient stay will include: orient to surroundings, keep bed in low position, maintain call bell within reach at all times, provide assistance with transfer out of bed and ambulation.  

## 2019-12-23 ENCOUNTER — Telehealth: Payer: Self-pay | Admitting: *Deleted

## 2019-12-23 ENCOUNTER — Ambulatory Visit: Payer: Medicare PPO

## 2019-12-23 NOTE — Telephone Encounter (Signed)
Attempted to call for post procedure follow-up. Message left. 

## 2019-12-25 ENCOUNTER — Ambulatory Visit: Payer: Medicare PPO

## 2020-01-05 ENCOUNTER — Ambulatory Visit (HOSPITAL_BASED_OUTPATIENT_CLINIC_OR_DEPARTMENT_OTHER): Payer: Medicare PPO | Admitting: Student in an Organized Health Care Education/Training Program

## 2020-01-05 ENCOUNTER — Encounter: Payer: Self-pay | Admitting: Student in an Organized Health Care Education/Training Program

## 2020-01-05 ENCOUNTER — Ambulatory Visit
Admission: RE | Admit: 2020-01-05 | Discharge: 2020-01-05 | Disposition: A | Discharge status: Home / Self Care | Payer: Medicare PPO | Source: Ambulatory Visit | Attending: Student in an Organized Health Care Education/Training Program | Admitting: Student in an Organized Health Care Education/Training Program

## 2020-01-05 ENCOUNTER — Other Ambulatory Visit: Payer: Self-pay

## 2020-01-05 VITALS — BP 159/102 | HR 70 | Temp 97.2°F | Resp 20 | Ht 64.0 in | Wt 204.0 lb

## 2020-01-05 DIAGNOSIS — G894 Chronic pain syndrome: Secondary | ICD-10-CM | POA: Insufficient documentation

## 2020-01-05 DIAGNOSIS — M47816 Spondylosis without myelopathy or radiculopathy, lumbar region: Secondary | ICD-10-CM | POA: Insufficient documentation

## 2020-01-05 DIAGNOSIS — M5136 Other intervertebral disc degeneration, lumbar region: Secondary | ICD-10-CM | POA: Diagnosis not present

## 2020-01-05 MED ORDER — LIDOCAINE HCL 2 % IJ SOLN
20.0000 mL | Freq: Once | INTRAMUSCULAR | Status: AC
  Administered 2020-01-05: 400 mg

## 2020-01-05 MED ORDER — LIDOCAINE HCL 2 % IJ SOLN
INTRAMUSCULAR | Status: AC
  Filled 2020-01-05: qty 20

## 2020-01-05 NOTE — Progress Notes (Signed)
PROVIDER NOTE: Information contained herein reflects review and annotations entered in association with encounter. Interpretation of such information and data should be left to medically-trained personnel. Information provided to patient can be located elsewhere in the medical record under "Patient Instructions". Document created using STT-dictation technology, any transcriptional errors that may result from process are unintentional.    Patient: Susan [name]  Service Category: Procedure  Provider: Gillis Santa, MD  DOB: [birth year]  DOS: 01/05/2020  Location: [facility]  MRN: [id number]  Setting: Ambulatory - outpatient  Referring Provider: [name], MD  Type: Established Patient  Specialty: Interventional Pain Management  PCP: [name], MD   Primary Reason for Visit: Interventional Pain Management Treatment. CC: Back Pain (lower)  Procedure:          Anesthesia, Analgesia, Anxiolysis:  Sprint peripheral nerve stimulation of left L4 medial branch nerve for low back pain related to lumbar facet arthropathy, lumbar degenerative disc disease.  Type: Local Anesthesia  Local Anesthetic: Lidocaine 1-2%  Position: Prone   Indications: 1. Lumbar degenerative disc disease   2. Spondylosis without myelopathy or radiculopathy, lumbar region   3. Lumbar facet arthropathy   4. Chronic pain syndrome    Pain Score: Pre-procedure:3/10 Post-procedure: 0-No pain/10   Patient stopped Eliquis 3 days prior to scheduled procedure.  Last dose was Friday.  Pre-op Assessment:  Susan [name] is a 85 y.o. (year old), female patient, seen today for interventional treatment. She  has a past surgical history that includes left heart catheterization with coronary angiogram (10/19/2012); percutaneous coronary stent intervention (pci-s) (10/19/2012); percutaneous coronary stent intervention (pci-s) (N/A, 10/23/2012); Abdominal hysterectomy; Cardiac catheterization (N/A, 05/05/2015);  Breast excisional biopsy (Right, 1956); PORTA CATH REMOVAL (N/A, 04/05/2017); and Cardioversion (N/A, 07/27/2017). Susan [name] has a current medication list which includes the following prescription(s): acetaminophen, vitamin c, aspirin ec, atorvastatin, eliquis, ezetimibe, fluticasone, isosorbide mononitrate, loperamide, losartan, mirabegron er, multivitamin with minerals, nitrostat, nortriptyline, omeprazole, vitamin b-12, and tizanidine, and the following Facility-Administered Medications: sodium chloride flush and sodium chloride flush. Her primarily concern today is the Back Pain (lower)  Initial Vital Signs:  Pulse/HCG Rate: (!) 58  Temp: (!) 97.2 F (36.2 C) Resp: 16 BP: 128/68 SpO2: 98 %  BMI: Estimated body mass index is 35.02 kg/m as calculated from the following:   Height as of this encounter: 5\' 4"  (1.626 m).   Weight as of this encounter: 204 lb (92.5 kg).  Risk Assessment: Allergies: Reviewed. She is allergic to ace inhibitors and penicillins.  Allergy Precautions: None required Coagulopathies: Reviewed. None identified.  Blood-thinner therapy: None at this time Active Infection(s): Reviewed. None identified. Susan [name] is afebrile  Site Confirmation: Susan [name] was asked to confirm the procedure and laterality before marking the site Procedure checklist: Completed Consent: Before the procedure and under the influence of no sedative(s), amnesic(s), or anxiolytics, the patient was informed of the treatment options, risks and possible complications. To fulfill our ethical and legal obligations, as recommended by the American Medical Association's Code of Ethics, I have informed the patient of my clinical impression; the nature and purpose of the treatment or procedure; the risks, benefits, and possible complications of the intervention; the alternatives, including doing nothing; the risk(s) and benefit(s) of the alternative treatment(s) or procedure(s); and the risk(s) and  benefit(s) of doing nothing. The patient was provided information about the general risks and possible complications associated with the procedure. These may include, but are not limited to: failure to achieve desired goals, infection, bleeding,  organ or nerve damage, allergic reactions, paralysis, and death. In addition, the patient was informed of those risks and complications associated to Spine-related procedures, such as failure to decrease pain; infection (i.e.: Meningitis, epidural or intraspinal abscess); bleeding (i.e.: epidural hematoma, subarachnoid hemorrhage, or any other type of intraspinal or peri-dural bleeding); organ or nerve damage (i.e.: Any type of peripheral nerve, nerve root, or spinal cord injury) with subsequent damage to sensory, motor, and/or autonomic systems, resulting in permanent pain, numbness, and/or weakness of one or several areas of the body; allergic reactions; (i.e.: anaphylactic reaction); and/or death. Furthermore, the patient was informed of those risks and complications associated with the medications. These include, but are not limited to: allergic reactions (i.e.: anaphylactic or anaphylactoid reaction(s)); adrenal axis suppression; blood sugar elevation that in diabetics may result in ketoacidosis or comma; water retention that in patients with history of congestive heart failure may result in shortness of breath, pulmonary edema, and decompensation with resultant heart failure; weight gain; swelling or edema; medication-induced neural toxicity; particulate matter embolism and blood vessel occlusion with resultant organ, and/or nervous system infarction; and/or aseptic necrosis of one or more joints. Finally, the patient was informed that Medicine is not an exact science; therefore, there is also the possibility of unforeseen or unpredictable risks and/or possible complications that may result in a catastrophic outcome. The patient indicated having understood very  clearly. We have given the patient no guarantees and we have made no promises. Enough time was given to the patient to ask questions, all of which were answered to the patient's satisfaction. Susan [name] has indicated that she wanted to continue with the procedure. Attestation: I, the ordering provider, attest that I have discussed with the patient the benefits, risks, side-effects, alternatives, likelihood of achieving goals, and potential problems during recovery for the procedure that I have provided informed consent. Date  Time: 01/05/2020  8:40 AM  Pre-Procedure Preparation:  Monitoring: As per clinic protocol. Respiration, ETCO2, SpO2, BP, heart rate and rhythm monitor placed and checked for adequate function Safety Precautions: Patient was assessed for positional comfort and pressure points before starting the procedure. Time-out: I initiated and conducted the "Time-out" before starting the procedure, as per protocol. The patient was asked to participate by confirming the accuracy of the "Time Out" information. Verification of the correct person, site, and procedure were performed and confirmed by me, the nursing staff, and the patient. "Time-out" conducted as per Joint Commission's Universal Protocol (UP.01.01.01). Time: 0917  Description of Procedure:          Laterality: Left Levels:  L4,  Medial Branch Nerve Area Prepped: Posterior Lumbosacral Region DuraPrep (Iodine Povacrylex [0.7% available iodine] and Isopropyl Alcohol, 74% w/w)  After the risks, benefits and alternatives were discussed  with the patient and informed consent was obtained, patient  was placed in the prone position and padded to foster comfort.  Prior to delivery of anesthetics, the painful region was carefully  outlined with a marker. Appropriate skin and bony landmarks  were identified, and pertinent vascular structures were located.  The skin overlying the lumbosacral spine was prepped and  draped in sterile  fashion. Fluoroscopy was used to identify the spinous process and lamina in the center of the  patient's region of pain. After identifying and marking the intended target along the course of the medial branch nerve,  the skin around the planned entry point and the subcutaneous tissues are injected with local anesthetic.  An introducer needle and stimulating probe were  assembled, inserted and advanced along the intended course of the medial branch nerve as it traverses the lamina medial and inferior to the zygapophyseal joint, taking care to maintain the proper depth of insertion as the introducer is advanced under fluoroscopic. The introducer needle was delivered to a location in proximity to the nerve.  Multiple stimulation parameters were used to deliver stimulation to the medial branch nerve in concert with stimulating at multiple positions around the nerve. Nerve target  acquisition was confirmed noting generation of paresthesias in the paravertebral regions corresponding to the level being stimulated as well as rhythmic thumping within the multifidi, the  latter being further corroborated via palpation.   Various electrical parameter combinations were tested, and the lead location was adjusted (physically relocated) until the patient  indicated paresthesia or muscle tension overlapping the distribution of the patient's typical region of pain.   The stimulating probe was removed from the introducer and a percutaneous lead was guided through the needle and delivered to a location in similar proximity to the nerve. Final  location was verified with electrical stimulation and documented with fluoroscopy & ultrasound.   The introducer needle was removed, and the exposed end of the percutaneous lead was attached to an external stimulator unit. Various electrical parameter combinations were again  tested until the patient indicated paresthesia or muscle tension  overlapping the distribution of the  patient's typical region of pain.   After confirming that lead impedance was in the normal range, the external unit was detached, the needle was removed, and the lead was anchored at the skin.  The lead was threaded into the connector block and electrical continuity and desired patient response was confirmed. The connector block was attached to the external  stimulator unit.The site was covered with a sterile occlusive dressing and  a fluoroscopic and ultrasound image was taken to document final placement. The patient was observed for stability of vital signs and comfort.   Once the entire procedure was completed, the treated area was cleaned, making sure to leave some of the prepping solution back to take advantage of its long term bactericidal properties.   Illustration of the posterior view of the lumbar spine and the posterior neural structures. Laminae of L2 through S1 are labeled. DPRL5, dorsal primary ramus of L5; DPRS1, dorsal primary ramus of S1; DPR3, dorsal primary ramus of L3; FJ, facet (zygapophyseal) joint L3-L4; I, inferior articular process of L4; LB1, lateral branch of dorsal primary ramus of L1; IAB, inferior articular branches from L3 medial branch (supplies L4-L5 facet joint); IBP, intermediate branch plexus; MB3, medial branch of dorsal primary ramus of L3; NR3, third lumbar nerve root; S, superior articular process of L5; SAB, superior articular branches from L4 (supplies L4-5 facet joint also); TP3, transverse process of L3.  Vitals:   01/05/20 0843 01/05/20 0913 01/05/20 0920 01/05/20 0925  BP: 128/68 (!) 161/97 (!) 154/109 (!) 159/102  Pulse: (!) 58 73 70 70  Resp: 16 16 18 20   Temp: (!) 97.2 F (36.2 C)     TempSrc: Temporal     SpO2: 98% 97% 98% 98%  Weight: 204 lb (92.5 kg)     Height: 5\' 4"  (1.626 m)        Start Time: 0917 hrs. End Time: 0925 hrs.  Imaging Guidance (Spinal):          Type of Imaging Technique: Fluoroscopy Guidance (Spinal) and ultrasound  guidance to confirm multifidus activation Indication(s): Assistance in needle guidance and placement for  procedures requiring needle placement in or near specific anatomical locations not easily accessible without such assistance. Exposure Time: Please see nurses notes. Contrast: None used. Fluoroscopic Guidance: I was personally present during the use of fluoroscopy. "Tunnel Vision Technique" used to obtain the best possible view of the target area. Parallax error corrected before commencing the procedure. "Direction-depth-direction" technique used to introduce the needle under continuous pulsed fluoroscopy. Once target was reached, antero-posterior, oblique, and lateral fluoroscopic projection used confirm needle placement in all planes. Images permanently stored in EMR. Interpretation: No contrast injected. I personally interpreted the imaging intraoperatively. Adequate needle placement confirmed in multiple planes. Permanent images saved into the patient's record. Ultrasound guidance to confirm activation of multifidus muscle with confirmed stimulation by patient. Antibiotic Prophylaxis:   Anti-infectives (From admission, onward)   None     Indication(s): None identified  Post-operative Assessment:  Post-procedure Vital Signs:  Pulse/HCG Rate: 70  Temp: (!) 97.2 F (36.2 C) Resp: 20 BP: (!) 159/102 SpO2: 98 %  EBL: None  Complications: No immediate post-treatment complications observed by team, or reported by patient.  Note: The patient tolerated the entire procedure well. A repeat set of vitals were taken after the procedure and the patient was kept under observation following institutional policy, for this type of procedure. Post-procedural neurological assessment was performed, showing return to baseline, prior to discharge. The patient was provided with post-procedure discharge instructions, including a section on how to identify potential problems. Should any problems arise  concerning this procedure, the patient was given instructions to immediately contact us, at any time, without hesitation. In any case, we plan to contact the patient by telephone for a follow-up status report regarding this interventional procedure.  Comments:  No additional relevant information.  Plan of Care  Orders:  Orders Placed This Encounter  Procedures  . [facility] C-ARM 1-60 MIN NO REPORT    Intraoperative interpretation by procedural physician at [place].    Standing Status:   Standing    Number of Occurrences:   1    Order Specific Question:   Reason for exam:    Answer:   Assistance in needle guidance and placement for procedures requiring needle placement in or near specific anatomical locations not easily accessible without such assistance.   Patient instructed to restart Eliquis tomorrow so long as she has not had any lower extremity weakness.    Medications ordered for procedure: Meds ordered this encounter  Medications  . lidocaine (XYLOCAINE) 2 % (with pres) injection 400 mg   Medications administered: We administered lidocaine.  See the medical record for exact dosing, route, and time of administration.  Follow-up plan:   Return in about 8 weeks (around 03/01/2020) for Sprint PNS lead removal.      status post left L3, L4, L5 RFA on 01/16/2018. right L3, L4, L5 RFA: 04/14/2019; successful, 85% pain relief in lower lumbar region.  Right L4 Sprint peripheral nerve stimulation 12/22/2019.  Left L4 Sprint peripheral nerve stimulation 01/05/2020.    Recent Visits Date Type Provider Dept  12/22/19 Procedure visit Gillis Santa, MD [facility]  12/04/19 Office Visit Gillis Santa, MD [facility]  10/22/19 Office Visit Gillis Santa, MD [facility]  Showing recent visits within past 90 days and meeting all other requirements Today's Visits Date Type Provider Dept  01/05/20 Procedure visit Gillis Santa, MD [facility]  Showing today's visits and meeting all other requirements Future Appointments Date Type Provider Dept  03/01/20 Appointment Gillis Santa, MD [facility]  Showing future appointments within next 90 days and meeting all other requirements  Disposition: Discharge home  Discharge (Date  Time): 01/05/2020;   hrs.   Primary Care Physician: [name], MD Location: [facility] Note by: Gillis Santa, MD Date: 01/05/2020; Time: 9:52 AM  Disclaimer:  Medicine is not an exact science. The only guarantee in medicine is that nothing is guaranteed. It is important to note that the decision to proceed with this intervention was based on the information collected from the patient. The Data and conclusions were drawn from the patient's questionnaire, the interview, and the physical examination. Because the information was provided in large part by the patient, it cannot be guaranteed that it has not been purposely or unconsciously manipulated. Every effort has been made to obtain as much relevant data as possible for this evaluation. It is important to note that the conclusions that lead to this procedure are derived in large part from the available data. Always take into account that the treatment will also be dependent on availability of resources and existing treatment guidelines, considered by other Pain Management Practitioners as being common knowledge and practice, at the time of the intervention. For Medico-Legal purposes, it is also important to point out that variation in procedural techniques and pharmacological choices are the acceptable norm. The indications, contraindications, technique, and results of the above procedure should only be interpreted and judged by a Board-Certified Interventional Pain Specialist with extensive familiarity and expertise in the same exact procedure and technique.

## 2020-01-05 NOTE — Progress Notes (Signed)
Safety precautions to be maintained throughout the outpatient stay will include: orient to surroundings, keep bed in low position, maintain call bell within reach at all times, provide assistance with transfer out of bed and ambulation.  

## 2020-01-06 ENCOUNTER — Telehealth: Payer: Self-pay | Admitting: *Deleted

## 2020-01-06 NOTE — Telephone Encounter (Signed)
No problems post procedure. 

## 2020-01-08 ENCOUNTER — Other Ambulatory Visit: Payer: Self-pay | Admitting: Cardiovascular Disease

## 2020-01-08 ENCOUNTER — Other Ambulatory Visit: Payer: Self-pay

## 2020-01-08 MED ORDER — LOSARTAN POTASSIUM 25 MG PO TABS
50.0000 mg | ORAL_TABLET | Freq: Every day | ORAL | 0 refills | Status: DC
Start: 2020-01-08 — End: 2020-03-29

## 2020-01-22 ENCOUNTER — Other Ambulatory Visit: Payer: Self-pay | Admitting: Cardiovascular Disease

## 2020-02-09 DIAGNOSIS — G4733 Obstructive sleep apnea (adult) (pediatric): Secondary | ICD-10-CM | POA: Diagnosis not present

## 2020-02-23 ENCOUNTER — Ambulatory Visit: Payer: Medicare PPO | Admitting: Student in an Organized Health Care Education/Training Program

## 2020-03-01 ENCOUNTER — Encounter: Payer: Self-pay | Admitting: Student in an Organized Health Care Education/Training Program

## 2020-03-01 ENCOUNTER — Other Ambulatory Visit: Payer: Self-pay

## 2020-03-01 ENCOUNTER — Ambulatory Visit
Payer: Medicare PPO | Attending: Student in an Organized Health Care Education/Training Program | Admitting: Student in an Organized Health Care Education/Training Program

## 2020-03-01 ENCOUNTER — Ambulatory Visit: Payer: Medicare PPO | Admitting: Student in an Organized Health Care Education/Training Program

## 2020-03-01 VITALS — BP 139/72 | HR 59 | Temp 97.0°F | Resp 16 | Ht 64.0 in | Wt 204.0 lb

## 2020-03-01 DIAGNOSIS — M47816 Spondylosis without myelopathy or radiculopathy, lumbar region: Secondary | ICD-10-CM | POA: Diagnosis not present

## 2020-03-01 DIAGNOSIS — M5136 Other intervertebral disc degeneration, lumbar region: Secondary | ICD-10-CM | POA: Insufficient documentation

## 2020-03-01 NOTE — Progress Notes (Signed)
Safety precautions to be maintained throughout the outpatient stay will include: orient to surroundings, keep bed in low position, maintain call bell within reach at all times, provide assistance with transfer out of bed and ambulation.  

## 2020-03-01 NOTE — Progress Notes (Signed)
PROVIDER NOTE: Information contained herein reflects review and annotations entered in association with encounter. Interpretation of such information and data should be left to medically-trained personnel. Information provided to patient can be located elsewhere in the medical record under "Patient Instructions". Document created using STT-dictation technology, any transcriptional errors that may result from process are unintentional.    Patient: Susan [name]  Service Category: E/M  Provider: Gillis Santa, MD  DOB: 21-Jul-1934  DOS: 03/01/2020  Specialty: Interventional Pain Management  MRN: [id number]  Setting: Ambulatory outpatient  PCP: [name], MD  Type: Established Patient    Referring Provider: [name], MD  Location: Office  Delivery: Face-to-face     HPI  [name], a 85 y.o. year old female, is here today because of her Lumbar degenerative disc disease [M51.36]. Ms. Hardwick primary complain today is Back Pain (lower) Last encounter: My last encounter with her was on 01/05/2020. Pertinent problems: Ms. Kopec has Ischemic cardiomyopathy; Coronary artery disease; Spinal stenosis, lumbar region, with neurogenic claudication; Osteopenia; Arthritis, degenerative; Carcinoma of endometrium ([place]); Neuropathy associated with cancer ([place]); Chemotherapy-induced peripheral neuropathy ([place]); Chemotherapy-induced neuropathy ([place]); Compression fracture of L5 vertebra with delayed healing; Neuropathy; Sacral insufficiency fracture with delayed healing; Lumbar facet arthropathy; and Chronic pain syndrome on their pertinent problem list. Pain Assessment: Severity of Chronic pain is reported as a 3 /10. Location: Back Lower/denies. Onset: More than a month ago. Quality: Aching. Timing: Constant. Modifying factor(s): sitting, heat. Vitals:  height is _0  (1.626 m) and weight is 204 lb (92.5 kg). Her temporal temperature is 97 F (36.1 C) (abnormal). Her blood pressure is 139/72  and her pulse is 59 (abnormal). Her respiration is 16 and oxygen saturation is 98%.   Reason for encounter: post-procedure assessment.   Ms. Bowell presents today for removal of her Sprint peripheral nerve stimulation leads.  Unfortunately, the patient did not obtain any significant pain relief in regards to her low back pain related to lumbar degenerative disc disease, lumbar facet arthropathy with medial branch peripheral nerve stimulation.  She had a right L4 medial branch lead placed on 12/22/2019, left was placed on 01/05/2020.  This was removed today by myself, with lead tips intact.  ROS  Constitutional: Denies any fever or chills Gastrointestinal: No reported hemesis, hematochezia, vomiting, or acute GI distress Musculoskeletal: Denies any acute onset joint swelling, redness, loss of ROM, or weakness Neurological: No reported episodes of acute onset apraxia, aphasia, dysarthria, agnosia, amnesia, paralysis, loss of coordination, or loss of consciousness  Medication Review  acetaminophen, apixaban, aspirin EC, atorvastatin, ezetimibe, fluticasone, isosorbide mononitrate, loperamide, losartan, mirabegron ER, multivitamin with minerals, nitroGLYCERIN, nortriptyline, omeprazole, tiZANidine, vitamin B-12, and vitamin C  History Review  Allergy: Ms. Chihuahua is allergic to ace inhibitors and penicillins. Drug: Ms. Smarr  reports no history of drug use. Alcohol:  reports current alcohol use of about 1.0 standard drink of alcohol per week. Tobacco:  reports that she quit smoking about 45 years ago. She has never used smokeless tobacco. Social: Ms. Holtmeyer  reports that she quit smoking about 45 years ago. She has never used smokeless tobacco. She reports current alcohol use of about 1.0 standard drink of alcohol per week. She reports that she does not use drugs. Medical:  has a past medical history of Bradycardia, Chronic back pain, Coronary artery disease, Echoencephalogram abnormality,  Heart murmur, History of Holter monitoring, Hyperlipidemia, Hypertension, Myocardial infarction [facility]), Neuromuscular disorder ([place]), NSVT (nonsustained ventricular tachycardia) ([place]), Personal history of chemotherapy, Personal history  of radiation therapy, and Uterine cancer ([place]). Surgical: Ms. Vitiello  has a past surgical history that includes left heart catheterization with coronary angiogram (10/19/2012); percutaneous coronary stent intervention (pci-s) (10/19/2012); percutaneous coronary stent intervention (pci-s) (N/A, 10/23/2012); Abdominal hysterectomy; Cardiac catheterization (N/A, 05/05/2015); Breast excisional biopsy (Right, 1956); PORTA CATH REMOVAL (N/A, 04/05/2017); and Cardioversion (N/A, 07/27/2017). Family: family history includes Breast cancer (age of onset: 57) in her mother; Diabetes in her brother and father; Heart disease in her father and mother; Stroke in her brother.  Laboratory Chemistry Profile   Renal Lab Results  Component Value Date   BUN 13 12/15/2019   CREATININE 0.87 12/15/2019   GFRAA >60 12/15/2019   GFRNONAA >60 12/15/2019     Hepatic Lab Results  Component Value Date   AST 25 12/15/2019   ALT 14 12/15/2019   ALBUMIN 4.0 12/15/2019   ALKPHOS 92 12/15/2019     Electrolytes Lab Results  Component Value Date   NA 141 12/15/2019   K 3.8 12/15/2019   CL 106 12/15/2019   CALCIUM 9.4 12/15/2019   MG 2.2 07/14/2015     Bone No results found for: VD25OH, VD125OH2TOT, KV4259DG3, OV5643PI9, 25OHVITD1, 25OHVITD2, 25OHVITD3, TESTOFREE, TESTOSTERONE   Inflammation (CRP: Acute Phase) (ESR: Chronic Phase) No results found for: CRP, ESRSEDRATE, LATICACIDVEN     Note: Above Lab results reviewed.   Physical Exam  General appearance: Well nourished, well developed, and well hydrated. In no apparent acute distress Mental status: Alert, oriented x 3 (person, place, & time)       Respiratory: No evidence of acute respiratory distress Eyes: PERLA Vitals: BP 139/72    Pulse (!) 59   Temp (!) 97 F (36.1 C) (Temporal)   Resp 16   Ht _0  (1.626 m)   Wt 204 lb (92.5 kg)   SpO2 98%   BMI 35.02 kg/m  BMI: Estimated body mass index is 35.02 kg/m as calculated from the following:   Height as of this encounter: _1  (1.626 m).   Weight as of this encounter: 204 lb (92.5 kg). Ideal: Ideal body weight: 54.7 kg (120 lb 9.5 oz) Adjusted ideal body weight: 69.8 kg (153 lb 15.3 oz)  Lumbar Spine Area Exam  Skin & Axial Inspection:No masses, redness, or swelling Alignment:Symmetrical Functional JJO:ACZY restricted ROMaffecting primarily the right greater than left Stability:No instability detected Muscle Tone/Strength:Functionally intact. No obvious neuro-muscular anomalies detected. Sensory (Neurological):Musculoskeletal pain pattern Palpation:No palpable anomalies Provocative Tests: Hyperextension/rotation test:(+)on the right greater than left for facet joint pain. Lumbar quadrant test (Kemp's test):Positive for bilateral facet joint pain right greater than left Lateral bending test:deferred today Patrick's Maneuver:deferred today FABER* test:deferred today S-I anterior distraction/compression test:deferred today S-I lateral compression test:deferred today S-I Thigh-thrust test:deferred today S-I Gaenslen's test:deferred today *(Flexion, ABduction and External Rotation)  Gait & Posture Assessment  Ambulation:Unassisted Gait:Relatively normal for age and body habitus Posture:Difficulty standing up straight, due to pain  Lower Extremity Exam    Side:Right lower extremity  Side:Left lower extremity  Stability:No instability observed  Stability:No instability observed  Skin & Extremity Inspection:Skin color, temperature, and hair growth are WNL. No peripheral edema or cyanosis. No masses, redness, swelling, asymmetry, or  associated skin lesions. No contractures.  Skin & Extremity Inspection:Skin color, temperature, and hair growth are WNL. No peripheral edema or cyanosis. No masses, redness, swelling, asymmetry, or associated skin lesions. No contractures.  Functional SAY:TKZS restricted ROMfor hip and knee joints   Functional WFU:[id number] ROM   Muscle Tone/Strength:Functionally intact. No obvious  neuro-muscular anomalies detected.  Muscle Tone/Strength:Functionally intact. No obvious neuro-muscular anomalies detected.  Sensory (Neurological):Arthropathic arthralgia  Sensory (Neurological):Unimpaired  DTR: Patellar:1+: trace Achilles:deferred today Plantar:deferred today  DTR: Patellar:1+: trace Achilles:deferred today Plantar:deferred today  Palpation:No palpable anomalies  Palpation:No palpable anomalies   Sprint peripheral nerve stimulation leads removed with tips intact.  Assessment   Status Diagnosis  Persistent Persistent Persistent 1. Lumbar degenerative disc disease   2. Spondylosis without myelopathy or radiculopathy, lumbar region   3. Lumbar facet arthropathy       Plan of Care   Patient plans to start physical therapy back up to strengthen her core.  This is a good idea.  She states that she will follow-up as needed.  Follow-up plan:   Return if symptoms worsen or fail to improve.     status post left L3, L4, L5 RFA on 01/16/2018. right L3, L4, L5 RFA: 04/14/2019; successful, 85% pain relief in lower lumbar region.  Right L4 Sprint peripheral nerve stimulation 12/22/2019.  Left L4 Sprint peripheral nerve stimulation 01/05/2020.  Both leads removed on 03/01/2020.  Unfortunately no significant pain relief with lumbar medial branch peripheral nerve stimulation.    Recent Visits Date Type Provider Dept  01/05/20 Procedure visit Gillis Santa, MD [facility]  12/22/19 Procedure visit Gillis Santa, MD [facility]  12/04/19 Office Visit Gillis Santa, MD [facility]  Showing recent visits within past 90 days and meeting all other requirements Today's Visits Date Type Provider Dept  03/01/20 Procedure visit Gillis Santa, MD [facility]  Showing today's visits and meeting all other requirements Future Appointments No visits were found meeting these conditions. Showing future appointments within next 90 days and meeting all other requirements  I discussed the assessment and treatment plan with the patient. The patient was provided an opportunity to ask questions and all were answered. The patient agreed with the plan and demonstrated an understanding of the instructions.  Patient advised to call back or seek an in-person evaluation if the symptoms or condition worsens.  Duration of encounter: 20 minutes.  Note by: Gillis Santa, MD Date: 03/01/2020; Time: 11:49 AM

## 2020-03-08 ENCOUNTER — Ambulatory Visit: Payer: Medicare PPO | Admitting: Student in an Organized Health Care Education/Training Program

## 2020-03-08 ENCOUNTER — Other Ambulatory Visit: Payer: Self-pay

## 2020-03-08 ENCOUNTER — Ambulatory Visit: Payer: Medicare PPO | Admitting: Urology

## 2020-03-08 VITALS — BP 130/80 | HR 85 | Wt 204.0 lb

## 2020-03-08 DIAGNOSIS — R350 Frequency of micturition: Secondary | ICD-10-CM | POA: Diagnosis not present

## 2020-03-08 DIAGNOSIS — N3946 Mixed incontinence: Secondary | ICD-10-CM | POA: Diagnosis not present

## 2020-03-08 MED ORDER — CIPROFLOXACIN HCL 250 MG PO TABS: ORAL_TABLET | ORAL | 0 refills | Status: DC

## 2020-03-08 NOTE — Progress Notes (Signed)
03/08/2020 10:03 AM   Susan [name] Oct 31, 1934 [id number]  Referring provider: [name], MD [phone] [place],  [place] [address]  Chief Complaint  Patient presents with  . Urinary Incontinence    HPI: I was consulted to assess the patient urinary incontinence which I believe worsened after her radiation last summer. She may have had a little bit of stress incontinence prior. Now she can leak with coughing and sneezing and bending and lifting if her bladder is full. Her primary symptom is urge incontinence triggered by sitting to standing position and especially worse in the morning with foot on the floor syndrome. She does not have bedwetting. She now wears 3-4 pads a day moderately wet but soaked in the morning  She voids every 1-2 hours worse after taking Lasix. She has no nocturia. She had a hysterectomy followed by chemotherapy followed by radiation for endometrial cancer 18 months ago  Well supported bladder neck with a mild positive cough test  The patient has mixed incontinence were primarily urgency incontinence with typical triggers. It was likely worsen post radiation and hysterectomy. If the patient's stress incontinence was ever treated a urethral injectable would be the treatment of choice  The patient failed Myrbetriq and had diarrhea on Toviaz. She had sensory urgency on urodynamics with a capacity of 260 mL. She has severe leakage with bladder overactivity. Her cough leak point pressure 200 mL was 24 cm of water with moderate leakage. Cystoscopy was within normal limits.The patient felt the radiation made things worse.  The patient has mixed incontinence. 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 The risks are a little bit higher for erosion with injectable recognizing limited data. The role of percutaneous tibial nerve stimulation was discussed.  The my index of suspicion  is high that the radiation caused her bladder become more overactive. She wants to think about percutaneous tibial nerve stimulation. I did mention Botox which is less ideal arguably in the radiation setting  Patient failed percutaneous tibial nerve stimulation. She does have neuropathy.  Patient and I spoke about Botox with my usual template and she just would rather live with the symptoms and try oxybutynin ER 10 mg with 90 tablets and 3 refills and see in a year. She would like to stop her Eliquis each time. She thinks the oxybutynin helps and she is hoping that the lower dose will give less side effects. Were not going to pursue InterStim. Increased risks and lack of efficacy especially with radiation discussed. I do not think she should have a urethral injectable either.  She has gone to [facility] gynecology and had urodynamics. They mention the pessary but she does not have prolapse symptoms. She is scheduled for a bulking agent. Should they stopped her oxybutynin they put her back on mirabegron on not helping.  She currently leaks a small amount with coughing sneezing. Still high-volume urge incontinence triggered from sitting to standing position. Soaking 5 pads a day. Still on blood thinner. Unfortunate her husband died from Covid and she is exhausted. She can drive but not on the highway. She has neuropathy.  Very lengthy discussion. I went over bulking treatment in full detail recognizing the chance of her helping her overactive bladder is 50% or less. We went over Botox briefly. She need to stop her blood thinner for all therapies. She is reaching the end of her treatment choices.  The patient is getting  near the end of her treatment algorithm and would like to try the bulking agent. I gave her 3-day prescription of ciprofloxacin. She will likely have a done in June after she gets back from a 2-week trip in [place]. She has gone through a lot especially with her  husband's death in 06/17/2022. I will make sure I do a good thorough cystoscopy again prior to the injection. Rare risk of erosion or infection in radiated tissue discussed. We will need to call Dr. Claiborne Billings her cardiologist in [place] to make certain she can stop her blood thinner 5 days previous and she said normally she does  Today Patient recently had bulking treatment in [place].  She still on the Myrbetriq.  She is dramatically better.  She no longer has high-volume leakage when she goes from a sitting to standing position.  She wears 2 pads a day that are damp.  Because of travel concerns from [place] we had left the catheter in overnight and she had a successful trial of voiding the next day  Medically not infected.  Injection was done off her blood thinner.  I found her tissues little bit friable and atrophied I reviewed my injection note the treatment was performed October 01, 2019  Today Frequency stable.  Patient is back to using 5 pads a day.  She has mixed incontinence.  No infections of urinary tract infection.  Still on the Myrbetriq from [place] and she might stop it because she is not sure it helps and somewhat expensive.  She would like another bulking treatment and the effect wore off around Thanksgiving.   PMH: Past Medical History:  Diagnosis Date  . Bradycardia   . Chronic back pain   . Coronary artery disease    a. s/p STEMI in 2014 with DES to LAD with staged PCI/DES placement to LCx  . Echoencephalogram abnormality   . Heart murmur   . History of Holter monitoring   . Hyperlipidemia   . Hypertension   . Myocardial infarction ([place])   . Neuromuscular disorder ([facility])    neuropathy  . NSVT (nonsustained ventricular tachycardia) ([place])   . Personal history of chemotherapy    ENDOMETRIAL CA  . Personal history of radiation therapy    ENDOMETRIAL CA  . Uterine cancer ([place])    serrous    Surgical History: Past Surgical History:  Procedure Laterality Date  .  ABDOMINAL HYSTERECTOMY    . BREAST EXCISIONAL BIOPSY Right 1956   NEG  . CARDIOVERSION N/A 07/27/2017   Procedure: CARDIOVERSION;  Surgeon: Skeet Latch, MD;  Location: [place];  Service: Cardiovascular;  Laterality: N/A;  . LEFT HEART CATHETERIZATION WITH CORONARY ANGIOGRAM  10/19/2012   Procedure: LEFT HEART CATHETERIZATION WITH CORONARY ANGIOGRAM;  Surgeon: Troy Sine, MD;  Location: [facility] CATH LAB;  Service: Cardiovascular;;  . PERCUTANEOUS CORONARY STENT INTERVENTION (PCI-S)  10/19/2012   Procedure: PERCUTANEOUS CORONARY STENT INTERVENTION (PCI-S);  Surgeon: Troy Sine, MD;  Location: [facility] CATH LAB;  Service: Cardiovascular;;  . PERCUTANEOUS CORONARY STENT INTERVENTION (PCI-S) N/A 10/23/2012   Procedure: PERCUTANEOUS CORONARY STENT INTERVENTION (PCI-S);  Surgeon: Troy Sine, MD;  Location[facility] CATH LAB;  Service: Cardiovascular;  Laterality: N/A;  . PERIPHERAL VASCULAR CATHETERIZATION N/A 05/05/2015   Procedure: Glori Luis Cath Insertion;  Surgeon: Algernon Huxley, MD;  Location: [place] CV LAB;  Service: Cardiovascular;  Laterality: N/A;  . PORTA CATH REMOVAL N/A 04/05/2017   Procedure: PORTA CATH REMOVAL;  Surgeon: Algernon Huxley, MD;  Location:  [place] CV LAB;  Service: Cardiovascular;  Laterality: N/A;    Home Medications:  Allergies as of 03/08/2020      Reactions   Ace Inhibitors Cough   Penicillins Rash, Other (See Comments)   Has patient had a PCN reaction causing immediate rash, facial/tongue/throat swelling, SOB or lightheadedness with hypotension: No Has patient had a PCN reaction causing severe rash involving mucus membranes or skin necrosis: No Has patient had a PCN reaction that required hospitalization: No Has patient had a PCN reaction occurring within the last 10 years: No If all of the above answers are "NO", then may proceed with Cephalosporin use.      Medication List       Accurate as of March 08, 2020 10:03 AM. If you have any questions, ask your  nurse or doctor.        acetaminophen 650 MG CR tablet Commonly known as: TYLENOL Take 650 mg by mouth every 8 (eight) hours as needed for pain.   aspirin EC 81 MG tablet Take 81 mg by mouth daily.   atorvastatin 80 MG tablet Commonly known as: LIPITOR TAKE 1 TABLET (80 MG TOTAL) BY MOUTH DAILY AT 6 PM.   Eliquis 5 MG Tabs tablet Generic drug: apixaban TAKE 1 TABLET BY MOUTH TWICE A DAY   ezetimibe 10 MG tablet Commonly known as: ZETIA TAKE 1 TABLET BY MOUTH EVERY DAY   fluticasone 50 MCG/ACT nasal spray Commonly known as: FLONASE SPRAY 2 SPRAYS INTO EACH NOSTRIL EVERY DAY   isosorbide mononitrate 30 MG 24 hr tablet Commonly known as: IMDUR Take 1 tablet (30 mg total) by mouth daily.   loperamide 2 MG tablet Commonly known as: IMODIUM A-D Take 2 mg by mouth 4 (four) times daily as needed for diarrhea or loose stools.   losartan 25 MG tablet Commonly known as: COZAAR Take 2 tablets (50 mg total) by mouth daily. Please advise patient to schedule appointment   mirabegron ER 50 MG Tb24 tablet Commonly known as: MYRBETRIQ Take 50 mg by mouth daily.   multivitamin with minerals Tabs tablet Take 1 tablet by mouth daily.   Nitrostat 0.4 MG SL tablet Generic drug: nitroGLYCERIN PLACE 1 TABLET UNDER THE TOUGUE EVERY 5 MINUTES FOR UP TO 3 DOSES AS NEEDED FOR CHEST PAIN   nortriptyline 10 MG capsule Commonly known as: PAMELOR Take 10 mg by mouth at bedtime.   omeprazole 20 MG capsule Commonly known as: PRILOSEC Take 20 mg by mouth daily.   tiZANidine 4 MG tablet Commonly known as: ZANAFLEX Take 4 mg by mouth at bedtime.   vitamin B-12 1000 MCG tablet Commonly known as: CYANOCOBALAMIN Take 1,000 mcg by mouth daily.   vitamin C 1000 MG tablet Take 1,000 mg by mouth daily. Taking prior to PNS procedure.       Allergies:  Allergies  Allergen Reactions  . Ace Inhibitors Cough  . Penicillins Rash and Other (See Comments)    Has patient had a PCN reaction  causing immediate rash, facial/tongue/throat swelling, SOB or lightheadedness with hypotension: No Has patient had a PCN reaction causing severe rash involving mucus membranes or skin necrosis: No Has patient had a PCN reaction that required hospitalization: No Has patient had a PCN reaction occurring within the last 10 years: No If all of the above answers are "NO", then may proceed with Cephalosporin use.     Family History: Family History  Problem Relation Age of Onset  . Heart disease Mother   .  Breast cancer Mother 76  . Heart disease Father   . Diabetes Father   . Diabetes Brother   . Stroke Brother   . Kidney cancer Neg Hx   . Bladder Cancer Neg Hx     Social History:  reports that she quit smoking about 45 years ago. She has never used smokeless tobacco. She reports current alcohol use of about 1.0 standard drink of alcohol per week. She reports that she does not use drugs.  ROS:                                        Physical Exam: BP 130/80   Pulse 85   Wt 92.5 kg   BMI 35.02 kg/m   Constitutional:  Alert and oriented, No acute distress.   Laboratory Data: Lab Results  Component Value Date   WBC 6.1 12/15/2019   HGB 13.8 12/15/2019   HCT 40.6 12/15/2019   MCV 90.4 12/15/2019   PLT 222 12/15/2019    Lab Results  Component Value Date   CREATININE 0.87 12/15/2019    No results found for: PSA  No results found for: TESTOSTERONE  Lab Results  Component Value Date   HGBA1C 5.7 (H) 10/19/2012    Urinalysis    Component Value Date/Time   COLORURINE YELLOW (A) 02/20/2018 0905   APPEARANCEUR Cloudy (A) 08/04/2019 1024   LABSPEC 1.013 02/20/2018 0905   PHURINE 5.0 02/20/2018 0905   GLUCOSEU Negative 08/04/2019 1024   HGBUR MODERATE (A) 02/20/2018 0905   BILIRUBINUR Negative 08/04/2019 1024   KETONESUR NEGATIVE 02/20/2018 0905   PROTEINUR Negative 08/04/2019 1024   PROTEINUR 30 (A) 02/20/2018 0905   NITRITE Negative  08/04/2019 1024   NITRITE NEGATIVE 02/20/2018 0905   LEUKOCYTESUR Trace (A) 08/04/2019 1024    Pertinent Imaging:   Assessment & Plan: We will schedule patient for second bulking treatment.  We will speak to her cardiologist Dr. Claiborne Billings.  She will need to stop her aspirin and Eliquis.  Hopefully this can greatly improve her mixed incontinence.  Otherwise I do not have a lot of other options for her.  I gave her a 3-day prescription of ciprofloxacin 250 mg twice a day for 3 days There are no diagnoses linked to this encounter.  No follow-ups on file.  Reece Packer, MD  [facility] [address], [place], [place] [address] [phone]

## 2020-03-08 NOTE — Addendum Note (Signed)
Addended by: Alvera Novel on: 03/08/2020 11:03 AM   Modules accepted: Orders

## 2020-03-09 LAB — MICROSCOPIC EXAMINATION: Bacteria, UA: NONE SEEN

## 2020-03-09 LAB — URINALYSIS, COMPLETE
Bilirubin, UA: NEGATIVE
Glucose, UA: NEGATIVE
Ketones, UA: NEGATIVE
Leukocytes,UA: NEGATIVE
Nitrite, UA: NEGATIVE
Protein,UA: NEGATIVE
Specific Gravity, UA: 1.025 (ref 1.005–1.030)
Urobilinogen, Ur: 0.2 mg/dL (ref 0.2–1.0)
pH, UA: 5 (ref 5.0–7.5)

## 2020-03-10 ENCOUNTER — Telehealth: Payer: Self-pay | Admitting: Cardiovascular Disease

## 2020-03-10 NOTE — Telephone Encounter (Signed)
   [facility] Pre-operative Risk Assessment    Request for surgical clearance:  1. What type of surgery is being performed? Bulkamid Injectable - bulking agent to help with incontinence   2. When is this surgery scheduled? 04/22/2020  3. What type of clearance is required (medical clearance vs. Pharmacy clearance to hold med vs. Both)? Pharmacy   4. Are there any medications that need to be held prior to surgery and how long? eloquis and aspirin held prior to procedure for 3-5 days   5. Practice name and name of physician performing surgery? [facility] & Dr. Rogue Bussing  6. What is your office phone number [phone] ext [phone]   7.   What is your office fax number [phone]  8.   Anesthesia type (None, local, MAC, general) ? none   Kaitlyn Cornell 03/10/2020, 12:06 PM  _________________________________________________________________   (provider comments below)

## 2020-03-11 NOTE — Telephone Encounter (Signed)
Patient with diagnosis of afib on Eliquis for anticoagulation.    Procedure: bulkamid injection Date of procedure: 04/22/20  CHA2DS2-VASc Score = 5  This indicates a 7.2% annual risk of stroke. The patient's score is based upon: CHF History: No HTN History: Yes Diabetes History: No Stroke History: No Vascular Disease History: Yes Age Score: 2 Gender Score: 1   CrCl 70mL/min using adjusted body weight Platelet count 222K  Per office protocol, patient can hold Eliquis for 3 days prior to procedure.  Preop team to address aspirin length of hold.

## 2020-03-11 NOTE — Telephone Encounter (Signed)
Susan [name] 85 yr old female would like to have a Bulkamid bladder injection.  She was contacted as part of the preoperative protocol.  She continues to do well and denies cardiac complaints.  Her PMH includes coronary artery disease status post STEMI 2004 with DES to LAD and staged PCI with DES placement to LCx.,  HTN, HLD, endometrial cancer status post radiation chemotherapy with complication of neuropathy, left bundle branch block, severe sleep apnea on CPAP, ICM.  May her aspirin be held prior to the procedure?  Thank you for your help.  Please direct response to CV DIV preop pool.  [name]. [name] NP-C    03/11/2020, 12:07 PM [address] Office [phone] Fax [phone]

## 2020-03-12 NOTE — Telephone Encounter (Signed)
Okay to hold aspirin for procedure °

## 2020-03-14 NOTE — Telephone Encounter (Signed)
   Primary Cardiologist: Shelva Majestic, MD  Chart reviewed as part of pre-operative protocol coverage. Patient was contacted 03/14/2020 in reference to pre-operative risk assessment for pending surgery as outlined below.  Susan [name] was last seen on 05/29/2019 by Dr. Claiborne Billings.  Since that day, Susan [name] has done well without chest pain or shortness of breath.  Therefore, based on ACC/AHA guidelines, the patient would be at acceptable risk for the planned procedure without further cardiovascular testing.   The patient was advised that if she develops new symptoms prior to surgery to contact our office to arrange for a follow-up visit, and she verbalized understanding.  I will route this recommendation to the requesting party via Epic fax function and remove from pre-op pool. Please call with questions.  Patient will need to hold her Eliquis for 3 days and aspirin for 5 to 7 days prior to the procedure and restart as soon as possible afterward at the surgeon's discretion  [name], PA 03/14/2020, 12:57 PM

## 2020-03-26 ENCOUNTER — Other Ambulatory Visit: Payer: Self-pay | Admitting: Cardiovascular Disease

## 2020-04-06 ENCOUNTER — Ambulatory Visit (INDEPENDENT_AMBULATORY_CARE_PROVIDER_SITE_OTHER): Payer: Medicare PPO | Admitting: Physician Assistant

## 2020-04-06 ENCOUNTER — Encounter: Payer: Self-pay | Admitting: Physician Assistant

## 2020-04-06 ENCOUNTER — Other Ambulatory Visit: Payer: Self-pay

## 2020-04-06 VITALS — BP 144/83 | HR 85 | Ht 64.0 in | Wt 204.0 lb

## 2020-04-06 DIAGNOSIS — R350 Frequency of micturition: Secondary | ICD-10-CM | POA: Diagnosis not present

## 2020-04-06 DIAGNOSIS — R300 Dysuria: Secondary | ICD-10-CM | POA: Diagnosis not present

## 2020-04-06 MED ORDER — SULFAMETHOXAZOLE-TRIMETHOPRIM 800-160 MG PO TABS: 1.0000 | ORAL_TABLET | Freq: Two times a day (BID) | ORAL | 0 refills | Status: AC

## 2020-04-06 NOTE — Progress Notes (Signed)
04/06/2020 4:58 PM   Susan [name] [birth year] [id number]  CC: Chief Complaint  Patient presents with   Urinary Frequency   Dysuria   HPI: Susan [name] is a 86 y.o. female with PMH OAB with mixed incontinence on Myrbetriq with a history of urethral bulking treatment with Dr. Sherron Monday in [place] who presents today for evaluation of painful urination and urinary frequency.  Today she reports an approximate 4-day history of spasming pain with urination and nocturia approximately every 2 hours that is atypical for her at baseline.  She denies a history of recurrent UTI.  She is scheduled for another urethral bulking procedure with Dr. Sherron Monday at the end of January.  She denies fever, chills, nausea, and vomiting.    In-office UA today positive for 2+ blood, trace protein, and 1+ leukocyte esterase; urine microscopy with >30 WBCs/HPF and 3-10 RBCs/HPF.  PMH: Past Medical History:  Diagnosis Date   Bradycardia    Chronic back pain    Coronary artery disease    a. s/p STEMI in 2014 with DES to LAD with staged PCI/DES placement to LCx   Echoencephalogram abnormality    Heart murmur    History of Holter monitoring    Hyperlipidemia    Hypertension    Myocardial infarction (HCC)    Neuromuscular disorder (HCC)    neuropathy   NSVT (nonsustained ventricular tachycardia) (HCC)    Personal history of chemotherapy    ENDOMETRIAL CA   Personal history of radiation therapy    ENDOMETRIAL CA   Uterine cancer (HCC)    serrous    Surgical History: Past Surgical History:  Procedure Laterality Date   ABDOMINAL HYSTERECTOMY     BREAST EXCISIONAL BIOPSY Right 1956   NEG   CARDIOVERSION N/A 07/27/2017   Procedure: CARDIOVERSION;  Surgeon: Chilton Si, MD;  Location: [facility] ENDOSCOPY;  Service: Cardiovascular;  Laterality: N/A;   LEFT HEART CATHETERIZATION WITH CORONARY ANGIOGRAM  10/19/2012   Procedure: LEFT HEART CATHETERIZATION WITH CORONARY  ANGIOGRAM;  Surgeon: Lennette Bihari, MD;  Location: [facility] CATH LAB;  Service: Cardiovascular;;   PERCUTANEOUS CORONARY STENT INTERVENTION (PCI-S)  10/19/2012   Procedure: PERCUTANEOUS CORONARY STENT INTERVENTION (PCI-S);  Surgeon: Lennette Bihari, MD;  Location: [facility] CATH LAB;  Service: Cardiovascular;;   PERCUTANEOUS CORONARY STENT INTERVENTION (PCI-S) N/A 10/23/2012   Procedure: PERCUTANEOUS CORONARY STENT INTERVENTION (PCI-S);  Surgeon: Lennette Bihari, MD;  Location[facility] CATH LAB;  Service: Cardiovascular;  Laterality: N/A;   PERIPHERAL VASCULAR CATHETERIZATION N/A 05/05/2015   Procedure: Shelda Pal Cath Insertion;  Surgeon: Annice Needy, MD;  Location: [facility] INVASIVE CV LAB;  Service: Cardiovascular;  Laterality: N/A;   PORTA CATH REMOVAL N/A 04/05/2017   Procedure: PORTA CATH REMOVAL;  Surgeon: Annice Needy, MD;  Location: [facility] INVASIVE CV LAB;  Service: Cardiovascular;  Laterality: N/A;    Home Medications:  Allergies as of 04/06/2020      Reactions   Ace Inhibitors Cough   Penicillins Rash, Other (See Comments)   Has patient had a PCN reaction causing immediate rash, facial/tongue/throat swelling, SOB or lightheadedness with hypotension: No Has patient had a PCN reaction causing severe rash involving mucus membranes or skin necrosis: No Has patient had a PCN reaction that required hospitalization: No Has patient had a PCN reaction occurring within the last 10 years: No If all of the above answers are "NO", then may proceed with Cephalosporin use.      Medication List       Accurate as  of April 06, 2020  4:58 PM. If you have any questions, ask your nurse or doctor.        STOP taking these medications   ciprofloxacin 250 MG tablet Commonly known as: Cipro Stopped by: Debroah Loop, PA-C     TAKE these medications   acetaminophen 650 MG CR tablet Commonly known as: TYLENOL Take 650 mg by mouth every 8 (eight) hours as needed for pain.   aspirin EC 81 MG tablet Take 81 mg by  mouth daily.   atorvastatin 80 MG tablet Commonly known as: LIPITOR TAKE 1 TABLET (80 MG TOTAL) BY MOUTH DAILY AT 6 PM.   Eliquis 5 MG Tabs tablet Generic drug: apixaban TAKE 1 TABLET BY MOUTH TWICE A DAY   ezetimibe 10 MG tablet Commonly known as: ZETIA TAKE 1 TABLET BY MOUTH EVERY DAY   fluticasone 50 MCG/ACT nasal spray Commonly known as: FLONASE SPRAY 2 SPRAYS INTO EACH NOSTRIL EVERY DAY   isosorbide mononitrate 30 MG 24 hr tablet Commonly known as: IMDUR Take 1 tablet (30 mg total) by mouth daily.   loperamide 2 MG tablet Commonly known as: IMODIUM A-D Take 2 mg by mouth 4 (four) times daily as needed for diarrhea or loose stools.   losartan 25 MG tablet Commonly known as: COZAAR TAKE 2 TABLETS (50 MG TOTAL) BY MOUTH DAILY.   mirabegron ER 50 MG Tb24 tablet Commonly known as: MYRBETRIQ Take 50 mg by mouth daily.   multivitamin with minerals Tabs tablet Take 1 tablet by mouth daily.   Nitrostat 0.4 MG SL tablet Generic drug: nitroGLYCERIN PLACE 1 TABLET UNDER THE TOUGUE EVERY 5 MINUTES FOR UP TO 3 DOSES AS NEEDED FOR CHEST PAIN   nortriptyline 10 MG capsule Commonly known as: PAMELOR Take 10 mg by mouth at bedtime.   omeprazole 20 MG capsule Commonly known as: PRILOSEC Take 20 mg by mouth daily.   sulfamethoxazole-trimethoprim 800-160 MG tablet Commonly known as: BACTRIM DS Take 1 tablet by mouth 2 (two) times daily for 5 days. Started by: Debroah Loop, PA-C   tiZANidine 4 MG tablet Commonly known as: ZANAFLEX Take 4 mg by mouth at bedtime.   vitamin B-12 1000 MCG tablet Commonly known as: CYANOCOBALAMIN Take 1,000 mcg by mouth daily.   vitamin C 1000 MG tablet Take 1,000 mg by mouth daily. Taking prior to PNS procedure.       Allergies:  Allergies  Allergen Reactions   Ace Inhibitors Cough   Penicillins Rash and Other (See Comments)    Has patient had a PCN reaction causing immediate rash, facial/tongue/throat swelling, SOB or  lightheadedness with hypotension: No Has patient had a PCN reaction causing severe rash involving mucus membranes or skin necrosis: No Has patient had a PCN reaction that required hospitalization: No Has patient had a PCN reaction occurring within the last 10 years: No If all of the above answers are "NO", then may proceed with Cephalosporin use.     Family History: Family History  Problem Relation Age of Onset   Heart disease Mother    Breast cancer Mother 46   Heart disease Father    Diabetes Father    Diabetes Brother    Stroke Brother    Kidney cancer Neg Hx    Bladder Cancer Neg Hx     Social History:   reports that she quit smoking about 46 years ago. She has never used smokeless tobacco. She reports current alcohol use of about 1.0 standard drink of alcohol per  week. She reports that she does not use drugs.  Physical Exam: BP (!) 144/83    Pulse 85    Ht 5\' 4"  (1.626 m)    Wt 204 lb (92.5 kg)    BMI 35.02 kg/m   Constitutional:  Alert and oriented, no acute distress, nontoxic appearing HEENT: [place], AT Cardiovascular: No clubbing, cyanosis, or edema Respiratory: Normal respiratory effort, no increased work of breathing Skin: No rashes, bruises or suspicious lesions Neurologic: Grossly intact, no focal deficits, moving all 4 extremities Psychiatric: Normal mood and affect  Laboratory Data: Results for orders placed or performed in visit on 04/06/20  Microscopic Examination   Urine  Result Value Ref Range   WBC, UA >30 (A) 0 - 5 /hpf   RBC 3-10 (A) 0 - 2 /hpf   Epithelial Cells (non renal) 0-10 0 - 10 /hpf   Bacteria, UA Few None seen/Few  Urinalysis, Complete  Result Value Ref Range   Specific Gravity, UA <1.005 (L) 1.005 - 1.030   pH, UA 6.0 5.0 - 7.5   Color, UA Yellow Yellow   Appearance Ur Cloudy (A) Clear   Leukocytes,UA 1+ (A) Negative   Protein,UA Trace (A) Negative/Trace   Glucose, UA Negative Negative   Ketones, UA Negative Negative   RBC, UA  2+ (A) Negative   Bilirubin, UA Negative Negative   Urobilinogen, Ur 0.2 0.2 - 1.0 mg/dL   Nitrite, UA Negative Negative   Microscopic Examination See below:    Assessment & Plan:   1. Dysuria Dysuria and increased urinary frequency x4 days with significant pyuria and microscopic hematuria on UA today consistent with acute cystitis.  Will send for culture for further evaluation and start empiric Bactrim.  We will plan for repeat UA in 1 week to prove resolution of MH.  Patient is in agreement with this plan. - Urinalysis, Complete - CULTURE, URINE COMPREHENSIVE - sulfamethoxazole-trimethoprim (BACTRIM DS) 800-160 MG tablet; Take 1 tablet by mouth 2 (two) times daily for 5 days.  Dispense: 10 tablet; Refill: 0 - Urinalysis, Complete; Future  Return in about 1 week (around 04/13/2020) for Lab visit for UA.  Debroah Loop, PA-C  [facility] [address], [place], [place] [address] [phone]

## 2020-04-07 LAB — MICROSCOPIC EXAMINATION: WBC, UA: >30 /hpf — AB (ref 0–5)

## 2020-04-07 LAB — URINALYSIS, COMPLETE
Bilirubin, UA: NEGATIVE
Glucose, UA: NEGATIVE
Ketones, UA: NEGATIVE
Nitrite, UA: NEGATIVE
Specific Gravity, UA: <1.005 — ABNORMAL LOW (ref 1.005–1.030)
Urobilinogen, Ur: 0.2 mg/dL (ref 0.2–1.0)
pH, UA: 6 (ref 5.0–7.5)

## 2020-04-09 LAB — CULTURE, URINE COMPREHENSIVE

## 2020-04-13 ENCOUNTER — Other Ambulatory Visit: Payer: Self-pay

## 2020-04-14 ENCOUNTER — Other Ambulatory Visit: Payer: Medicare PPO

## 2020-04-14 ENCOUNTER — Other Ambulatory Visit: Payer: Self-pay

## 2020-04-14 DIAGNOSIS — R300 Dysuria: Secondary | ICD-10-CM

## 2020-04-15 ENCOUNTER — Telehealth: Payer: Self-pay | Admitting: Physician Assistant

## 2020-04-15 DIAGNOSIS — R300 Dysuria: Secondary | ICD-10-CM

## 2020-04-15 LAB — URINALYSIS, COMPLETE
Bilirubin, UA: NEGATIVE
Glucose, UA: NEGATIVE
Ketones, UA: NEGATIVE
Nitrite, UA: NEGATIVE
Protein,UA: NEGATIVE
Specific Gravity, UA: 1.02 (ref 1.005–1.030)
Urobilinogen, Ur: 0.2 mg/dL (ref 0.2–1.0)
pH, UA: 5 (ref 5.0–7.5)

## 2020-04-15 LAB — MICROSCOPIC EXAMINATION

## 2020-04-15 NOTE — Telephone Encounter (Signed)
Notified patient as advised. Counseled patient on seeking care should she develop any of the s/s below. Patient verbalized full understanding.

## 2020-04-15 NOTE — Telephone Encounter (Signed)
Please contact the patient and inform her that her repeat urinalysis from this week continues to have blood in it despite her having completed culture appropriate antibiotics.  Based on the findings of her urinalysis, I am concerned that she may be passing a kidney stone.  I have placed an order today for a CT stone study and the imaging department will contact her to schedule this.  Please counsel her to contact our office immediately or proceed to the emergency department if she develops fever, chills, nausea, vomiting, or uncontrollable flank pain.

## 2020-04-22 DIAGNOSIS — N3642 Intrinsic sphincter deficiency (ISD): Secondary | ICD-10-CM | POA: Diagnosis not present

## 2020-04-30 ENCOUNTER — Other Ambulatory Visit: Payer: Self-pay

## 2020-04-30 ENCOUNTER — Ambulatory Visit
Admission: RE | Admit: 2020-04-30 | Discharge: 2020-04-30 | Disposition: A | Discharge status: Home / Self Care | Payer: Medicare PPO | Source: Ambulatory Visit | Attending: Physician Assistant | Admitting: Physician Assistant

## 2020-04-30 DIAGNOSIS — I700 Atherosclerosis of aorta: Secondary | ICD-10-CM | POA: Diagnosis not present

## 2020-04-30 DIAGNOSIS — N200 Calculus of kidney: Secondary | ICD-10-CM | POA: Insufficient documentation

## 2020-04-30 DIAGNOSIS — R300 Dysuria: Secondary | ICD-10-CM | POA: Diagnosis not present

## 2020-04-30 DIAGNOSIS — K449 Diaphragmatic hernia without obstruction or gangrene: Secondary | ICD-10-CM | POA: Insufficient documentation

## 2020-05-04 ENCOUNTER — Other Ambulatory Visit: Payer: Self-pay | Admitting: Cardiovascular Disease

## 2020-05-04 ENCOUNTER — Telehealth: Payer: Self-pay

## 2020-05-04 DIAGNOSIS — I2510 Atherosclerotic heart disease of native coronary artery without angina pectoris: Secondary | ICD-10-CM

## 2020-05-04 NOTE — Telephone Encounter (Signed)
-----   Message from Debroah Loop, [place] sent at 05/04/2020  8:32 AM EST ----- Please contact the patient and let her know that her CT shows no active stone episode. She has multiple left sided kidney stones that are not causing any obstruction and are not likely to be causing her any symptoms. Since she just underwent cystoscopy with Dr. Matilde Sprang during her recent bulking treatment, no need to further evaluate the persistent blood in her urine. If she develops new UTI symptoms or sees blood in her urine, please counsel her to follow-up with [place] in clinic. ----- Message ----- From: Bjorn Loser, MD Sent: 05/03/2020   9:12 AM EST To: Debroah Loop, PA-C   She had bulking treatment- no need to scope    ----- Message ----- From: Debroah Loop, PA-C Sent: 05/03/2020   8:32 AM EST To: Bjorn Loser, MD  I saw this patient of yours last month for a UTI with hematuria. Ucx grew E coli and I treated her with culture-appropriate Bactrim, however her urine did not clear after completing the course. I got a CT stone study because I was concerned she may be passing a stone (she was reporting spasming pain with urination and had calcium oxalate crystals in her urine, so I wondered if she had a lower tract stone). No active stone episode, but she does have multiple left-sided nonobstructing stones.   I know she was scheduled for a urethral bulking procedure with you late last month, did this occur? Trying to figure out if we need to schedule her for a cysto with you to evaluate her ongoing [place] or if you just did this. ----- Message ----- From: Interface, Rad Results In Sent: 04/30/2020  11:43 PM EST To: Debroah Loop, PA-C

## 2020-05-04 NOTE — Telephone Encounter (Signed)
Notified patient as advised, patient verbalized understanding.  

## 2020-05-10 ENCOUNTER — Telehealth: Payer: Self-pay | Admitting: Radiology

## 2020-05-10 NOTE — Telephone Encounter (Signed)
Patient would like Dr Matilde Sprang to know the bulking treatment did not work.

## 2020-05-12 ENCOUNTER — Other Ambulatory Visit: Payer: Self-pay | Admitting: Family Medicine

## 2020-05-12 DIAGNOSIS — Z1231 Encounter for screening mammogram for malignant neoplasm of breast: Secondary | ICD-10-CM

## 2020-05-19 DIAGNOSIS — L570 Actinic keratosis: Secondary | ICD-10-CM | POA: Diagnosis not present

## 2020-05-19 DIAGNOSIS — D2261 Melanocytic nevi of right upper limb, including shoulder: Secondary | ICD-10-CM | POA: Diagnosis not present

## 2020-05-19 DIAGNOSIS — X32XXXA Exposure to sunlight, initial encounter: Secondary | ICD-10-CM | POA: Diagnosis not present

## 2020-05-19 DIAGNOSIS — D2272 Melanocytic nevi of left lower limb, including hip: Secondary | ICD-10-CM | POA: Diagnosis not present

## 2020-05-19 DIAGNOSIS — D2262 Melanocytic nevi of left upper limb, including shoulder: Secondary | ICD-10-CM | POA: Diagnosis not present

## 2020-05-19 DIAGNOSIS — D2271 Melanocytic nevi of right lower limb, including hip: Secondary | ICD-10-CM | POA: Diagnosis not present

## 2020-05-19 DIAGNOSIS — D225 Melanocytic nevi of trunk: Secondary | ICD-10-CM | POA: Diagnosis not present

## 2020-05-19 DIAGNOSIS — L821 Other seborrheic keratosis: Secondary | ICD-10-CM | POA: Diagnosis not present

## 2020-05-31 DIAGNOSIS — G4733 Obstructive sleep apnea (adult) (pediatric): Secondary | ICD-10-CM | POA: Diagnosis not present

## 2020-06-03 DIAGNOSIS — R262 Difficulty in walking, not elsewhere classified: Secondary | ICD-10-CM | POA: Diagnosis not present

## 2020-06-03 DIAGNOSIS — R202 Paresthesia of skin: Secondary | ICD-10-CM | POA: Diagnosis not present

## 2020-06-03 DIAGNOSIS — M79605 Pain in left leg: Secondary | ICD-10-CM | POA: Diagnosis not present

## 2020-06-03 DIAGNOSIS — M545 Low back pain, unspecified: Secondary | ICD-10-CM | POA: Diagnosis not present

## 2020-06-03 DIAGNOSIS — M79604 Pain in right leg: Secondary | ICD-10-CM | POA: Diagnosis not present

## 2020-06-03 DIAGNOSIS — G629 Polyneuropathy, unspecified: Secondary | ICD-10-CM | POA: Diagnosis not present

## 2020-06-03 DIAGNOSIS — R200 Anesthesia of skin: Secondary | ICD-10-CM | POA: Diagnosis not present

## 2020-06-07 ENCOUNTER — Other Ambulatory Visit: Payer: Self-pay

## 2020-06-07 ENCOUNTER — Ambulatory Visit: Payer: Medicare PPO | Admitting: Cardiovascular Disease

## 2020-06-07 ENCOUNTER — Encounter: Payer: Self-pay | Admitting: Cardiovascular Disease

## 2020-06-07 VITALS — BP 122/68 | HR 65 | Ht 62.0 in | Wt 204.0 lb

## 2020-06-07 DIAGNOSIS — I4821 Permanent atrial fibrillation: Secondary | ICD-10-CM

## 2020-06-07 DIAGNOSIS — E785 Hyperlipidemia, unspecified: Secondary | ICD-10-CM | POA: Diagnosis not present

## 2020-06-07 DIAGNOSIS — I447 Left bundle-branch block, unspecified: Secondary | ICD-10-CM | POA: Diagnosis not present

## 2020-06-07 DIAGNOSIS — I495 Sick sinus syndrome: Secondary | ICD-10-CM | POA: Diagnosis not present

## 2020-06-07 DIAGNOSIS — I2510 Atherosclerotic heart disease of native coronary artery without angina pectoris: Secondary | ICD-10-CM | POA: Diagnosis not present

## 2020-06-07 DIAGNOSIS — I252 Old myocardial infarction: Secondary | ICD-10-CM | POA: Diagnosis not present

## 2020-06-07 DIAGNOSIS — Z7901 Long term (current) use of anticoagulants: Secondary | ICD-10-CM

## 2020-06-07 DIAGNOSIS — G4733 Obstructive sleep apnea (adult) (pediatric): Secondary | ICD-10-CM | POA: Diagnosis not present

## 2020-06-07 NOTE — Progress Notes (Signed)
[name] ID: Susan [name], [name]   DOB: [birth year], 86 y.o.   MRN: [id number]      HPI: Susan [name] is a 86 y.o. [name] who presents to Susan office today for a 12 month follow-up cardiology evaluation.  Susan [name] is originally from [place], [place] and now resides in [place], [place]. On 10/19/2012 Susan [name] presented [facility] in transfer from [facility] with an anterior wall ST segment elevation myocardial infarction. Susan [name] had chest pain of greater than 12 hours duration. I performed emergent cardiac catheterization  where her LAD was found to be totally occluded.  Susan [name] underwent stenting of her LAD extending from Susan ostium to mid region with 3.0x38 mm and 3.0x16 mm Promus Premier DES stents. Initially Susan [name] had significant wall motion abnormality involving Susan LAD territory. Susan [name] also was found to have a 95% mid AV groove circumflex stenosis.  On 10/23/2012 Susan [name] underwent successful staged intervention with insertion of a 2.25x12 mm Promus premier DES stent into Susan circumflex vessel. Subsequently Susan [name] had done well. Susan [name] did develop some hypotension leading to reduction in some of her medications.  Susan [name] was enrolled in [facility] [place] cardiac rehabilitation program. An exercise Myoview study on 12/03/2012 demonstrated significant salvage of myocardium with only a small area of distal antero-apical, apical, septal and  apical scar without evidence for ischemia. Post-rest ejection fraction was 66% and there was evidence for apical akinesis. Laboratory revealed an increased LDL particle number at 1352 despite being on atorvastatin 80 mg. Susan [name] was contacted and Zetia 10 mg was just added to this atorvastatin dose. Susan [name] did have 735 small LDL particles and a calculated LDL of 90 with HDL 40 and triglycerides 119.  Susan [name] had developed hematuria and was found to have left kidney stones.  Susan [name] underwent lithotripsy for her multiple kidney stones. A nuclear perfusion study prior to potential  discontinuance of dual anti-platelet therapy on 11/05/2013  was interpreted as low risk, but did show extensive scar in Susan distal LAD to return distribution.  Post-rest ejection fraction was 40%.  There was evidence for apical akinesis/mild dyskinesis.  Susan [name] has been documented to have episodes of nonsustained ventricular tachycardia and has seen [name]..  Her bradycardia has been more profound while sleeping.  Susan [name] was able to exercise and walk without limitation other than back discomfort.  Susan [name] was evaluated for possible indication for pacemaker was not felt that a pacemaker is indicated presently.  Because of her bradycardia Susan [name] has not been on beta blocker therapy.  Susan [name] has had episodes of nonsustained VT noted on a CardioNet monitor.  When I  saw her Susan [name] admitted that her sleep was poor and nonrestorative.  Susan [name] snored loudly and required frequent daytime naps.  He was sleeping at least 8 hours per night, but poor quality.  Susan [name] underwent a split-night protocol sleep study on 03/08/2015 and was found to have severe sleep apnea with an AHI of 35.8 overall and more severe with REM sleep with an AHI of 67.5 per hour.  Susan [name] had significant oxygen desaturation to a nadir of 77%.  There was loud snoring.  I recommended tai chi CPAP therapy at 9 cm water pressure.  Since initiating CPAP therapy.  Susan [name] has felt well.  Susan [name] is no longer having to take naps.  Susan [name] is using choice for DME company.  When I saw her one year ago, Susan [name] was meeting Medicare compliance. A download was obtained from 05/01/2015 through 05/30/2015 with usage stays at 93% and  70% of days greater than 4 hours.  Her AHI was excellent at 1.9 with a set pressure of 9 cm.    Susan [name] was diagnosed with endometrial cancer and underwent a total hysterectomy.  Susan [name] also has a Port-A-Cath that had undergone chemotherapy and also completed 25 radiation remains to her body and 3 intravaginally..  Susan [name] has developed a neuropathy for which he takes gabapentin.  He  is unaware of chest pain or palpitations.    When I saw her in 2018 Susan [name] continued to use CPAP with 100% compliance.  A download from 06/12/2016 through 06/13/2016 100% of usage stays.  Susan [name] has a ResMed AirSense10 AutoSet unit set at a pressure of 9 cm.  Her AHI is 1.1.  Susan [name] is using a Risk manager &Paykel Eson nasal mask.  There is no leak.    Since I last saw her in March 2018, Susan [name] has done well from a cardiac standpoint.  Susan [name] denies chest pain, PND, orthopnea, or palpitations.  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  However, Susan [name] has continued to have issues as result of her radiation and chemotherapy for her endometrial CA.  Susan [name] has developed a neuropathy and as result, has been walking with a cane or a walker.  Her gabapentin dose has been increased to 3 times per day.  Susan [name] also has a small fracture in her sacrum.  Susan [name] underwent initial evaluation at [facility] for possible kyphoplasty, but ultimately this was not done.  Susan [name] also has degenerative disc disease in L3-L5 for which Susan [name] has been seen pain management.  Susan [name] has undergone injection.  Susan [name] we having an additional injection next week and will need to hold Brilinta.  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  A recent CT has demonstrated 5 small nodules in her long and Susan [name] will be undergoing a follow-up CT in December.  When I saw her in October 2018 a CPAP download  from 11/27/2016 -12/26/2016 confirmed 100% compliance . AHI was1.2 at her 9 cm water pressure.  Susan [name] was averaging 7 hours and 57 minutes of sleep per night.  An Epworth Sleepiness Scale score was 1.  Susan [name] subsequently was found to have significant bradycardia on event monitor.  Susan [name] had heart rates down to Susan mid to low 30s.  There were 2 episodes where her heart rate dropped to 25 bpm and then 19 bpm.  Susan [name] was not on any AV nodal blocking drugs.  I recommended that Susan [name] go to Susan emergency room for evaluation.  Susan [name] was admitted overnight.  Susan [name] was evaluated by [name] Susan following  day.  He reviewed her Holter monitor.  At that time, no AF was appreciated but there were periods of Mobitz 1 block, rates in Susan 20s with pauses and junctional escape beats.  Her telemetry on that particular day was showing sinus bradycardia in Susan 40s-50s.  Susan [name] admitted that Susan [name] was entirely asymptomatic.  At that point Susan decision was made not to place a permanent pacemaker.  Susan [name] subsequently saw [facility] in office follow-up in January 2019.  When I saw her in March 2019 Susan [name] was much more tired and sleepy.  Susan [name] was not using CPAP since Susan [name] was coughing.  Susan [name] also was using Mucinex DM for her chest congestion.  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  Susan [name] has issues with back discomfort.  During that evaluation, her ECG showed that Susan [name] was in atrial fibrillation at 67  bpm with left bundle branch block with a rates variable ranging from low 40s to approximately 88.  An echo Doppler study on June 27, 2017 showed an EF of 40 to 45%.  There was mild aortic stenosis with a valve area estimated 1.82 cm.  Susan [name] had moderate left atrial dilatation.  There was mild pulmonary hypertension with a PA peak pressure 36 mm.    Susan [name] saw [name] for cardiology/EP follow-up on July 17, 2017.  At that time Susan [name] was not having any episodes of significant dizziness, presyncope or syncope.  They discussed an attempt at trying to get her back into sinus rhythm and Susan [name] has been on Eliquis.  Susan [name] underwent an outpatient successful cardioversion on Jul 27, 2017 for restoration of sinus rhythm at 1 shock at 150 J.   I  saw her in June 2019 at which time Susan [name] had issues with urinary incontinence as result of damage from radiation treatment.  Susan [name] admits to 1-2+ ankle swelling.  Susan [name] is unaware of her cardiac rhythm.  Susan [name] is now back on CPAP with 100% compliance and is no longer coughing.    Since I last saw her, Susan [name] has undergone recent evaluation with Dr.  Curt Bears on April 17, 2019.  Her ECG showed atrial fibrillation with left bundle branch block.  Susan [name] denies episodes of palpitations, chest pain shortness of breath orthopnea PND, dizziness presyncope or syncope.  Unfortunately, her husband fell in January and injured his hip.  Following a short hospitalization he was advised that he goes to a rehab center for physical therapy.  Unfortunately he contracted Covid during that time, subsequently was transferred to [facility] and unfortunately died on 05-05-2019 after being on a ventilator with COVID-19 infection.  I last saw her in March 2021.  At that time Susan [name] was having significant difficulty with Susan recent death of her husband from [place].  Susan [name] denied any chest pain PND orthopnea or palpitations.  Susan [name] is now living by herself.  Susan [name] continued to use CPAP therapy.  Her insurance has changed and Susan [name] will need to change her provider which currently is [facility] to [place] in [place].  Susan [name] has issues from radiation induced damage to her bladder and also has issues with neuropathy.   Since I last saw her, Susan [name] has had issues with her neuropathy and has continued issues with bladder problems from her radiation treatment.  Susan [name] also recently underwent Botox administration to her bladder.  Susan [name] denies any recurrent anginal symptoms or shortness of breath.  Susan [name] is unaware of palpitations.  Susan [name] has continued to be on isosorbide 30 mg and losartan 50 mg daily.  Susan [name] is on Eliquis for anticoagulation.  Susan [name] continues to be on atorvastatin 80 mg and Zetia 10 mg for hyperlipidemia.  Susan [name] is followed by Dr. Lovie Macadamia at [facility] in [place] for primary care.  Susan [name] continues to use CPAP with 100% compliance.  I obtained a download from February 12 through June 06, 2020 which shows 100% usage with average use at 6 hours and 48 minutes.  At 9 cm set pressure, AHI is excellent at 1.2.  Susan [name] presents for reevaluation.  Past Medical History:  Diagnosis  Date  . Bradycardia   . Chronic back pain   . Coronary artery disease    a. s/p STEMI in 2014 with DES to LAD with staged PCI/DES placement to LCx  . Echoencephalogram abnormality   . Heart murmur   . History  of Holter monitoring   . Hyperlipidemia   . Hypertension   . Myocardial infarction ([place])   . Neuromuscular disorder (HCC)    neuropathy  . NSVT (nonsustained ventricular tachycardia) ([place])   . Personal history of chemotherapy    ENDOMETRIAL CA  . Personal history of radiation therapy    ENDOMETRIAL CA  . Uterine cancer ([place])    serrous    Past Surgical History:  Procedure Laterality Date  . ABDOMINAL HYSTERECTOMY    . BREAST EXCISIONAL BIOPSY Right 1956   NEG  . CARDIOVERSION N/A 07/27/2017   Procedure: CARDIOVERSION;  Surgeon: Skeet Latch, MD;  Location: [facility];  Service: Cardiovascular;  Laterality: N/A;  . LEFT HEART CATHETERIZATION WITH CORONARY ANGIOGRAM  10/19/2012   Procedure: LEFT HEART CATHETERIZATION WITH CORONARY ANGIOGRAM;  Surgeon: Troy Sine, MD;  Location: [facility] CATH LAB;  Service: Cardiovascular;;  . PERCUTANEOUS CORONARY STENT INTERVENTION (PCI-S)  10/19/2012   Procedure: PERCUTANEOUS CORONARY STENT INTERVENTION (PCI-S);  Surgeon: Troy Sine, MD;  Location: [facility] C[facility] LAB;  Service: Cardiovascular;;  . PERCUTANEOUS CORONARY STENT INTERVENTION (PCI-S) N/A 10/23/2012   Procedure: PERCUTANEOUS CORONARY STENT INTERVENTION (PCI-S);  Surgeon: Troy Sine, MD;  Location: [facility] CATH LAB;  Service: Cardiovascular;  Laterality: N/A;  . PERIPHERAL VASCULAR CATHETERIZATION N/A 05/05/2015   Procedure: Glori Luis Cath Insertion;  Surgeon: Algernon Huxley, MD;  Location: [place] CV LAB;  Service: Cardiovascular;  Laterality: N/A;  . PORTA CATH REMOVAL N/A 04/05/2017   Procedure: PORTA CATH REMOVAL;  Surgeon: Algernon Huxley, MD;  Location: [place] CV LAB;  Service: Cardiovascular;  Laterality: N/A;    Allergies  Allergen Reactions  . Ace Inhibitors Cough  .  Penicillins Rash and Other (See Comments)    Has [name] had a PCN reaction causing immediate rash, facial/tongue/throat swelling, SOB or lightheadedness with hypotension: No Has [name] had a PCN reaction causing severe rash involving mucus membranes or skin necrosis: No Has [name] had a PCN reaction that required hospitalization: No Has [name] had a PCN reaction occurring within Susan last 10 years: No If all of Susan above answers are "NO", then may proceed with Cephalosporin use.     Current Outpatient Medications  Medication Sig Dispense Refill  . Ascorbic Acid (VITAMIN C) 1000 MG tablet Take 1,000 mg by mouth daily. Taking prior to PNS procedure.    [name] aspirin EC 81 MG tablet Take 81 mg by mouth daily.    [name] atorvastatin (LIPITOR) 80 MG tablet TAKE 1 TABLET (80 MG TOTAL) BY MOUTH DAILY AT 6 PM. 90 tablet 3  . ELIQUIS 5 MG TABS tablet TAKE 1 TABLET BY MOUTH TWICE A DAY 180 tablet 1  . ezetimibe (ZETIA) 10 MG tablet TAKE 1 TABLET BY MOUTH EVERY DAY 90 tablet 1  . fluticasone (FLONASE) 50 MCG/ACT nasal spray SPRAY 2 SPRAYS INTO EACH NOSTRIL EVERY DAY    . isosorbide mononitrate (IMDUR) 30 MG 24 hr tablet Take 1 tablet (30 mg total) by mouth daily. 90 tablet 3  . loperamide (IMODIUM A-D) 2 MG tablet Take 2 mg by mouth 4 (four) times daily as needed for diarrhea or loose stools.     [name] losartan (COZAAR) 25 MG tablet TAKE 2 TABLETS BY MOUTH EVERY DAY 180 tablet 0  . mirabegron ER (MYRBETRIQ) 50 MG TB24 tablet Take 50 mg by mouth daily.    . Multiple Vitamin (MULTIVITAMIN WITH MINERALS) TABS Take 1 tablet by mouth daily.    [name] NITROSTAT 0.4 MG SL  tablet PLACE 1 TABLET UNDER Susan TOUGUE EVERY 5 MINUTES FOR UP TO 3 DOSES AS NEEDED FOR CHEST PAIN 25 tablet 0  . nortriptyline (PAMELOR) 10 MG capsule Take 10 mg by mouth at bedtime.    [name] omeprazole (PRILOSEC) 20 MG capsule Take 20 mg by mouth daily.    . vitamin B-12 (CYANOCOBALAMIN) 1000 MCG tablet Take 1,000 mcg by mouth daily.     No current  facility-administered medications for this visit.   Facility-Administered Medications Ordered in Other Visits  Medication Dose Route Frequency Provider Last Rate Last Admin  . sodium chloride flush (NS) 0.9 % injection 10 mL  10 mL Intravenous PRN Cammie Sickle, MD   10 mL at 06/06/16 1447  . sodium chloride flush (NS) 0.9 % injection 10 mL  10 mL Intravenous PRN Cammie Sickle, MD   10 mL at 08/15/16 1404    Social History   Socioeconomic History  . Marital status: Widowed    Spouse name: Not on file  . Number of children: Not on file  . Years of education: Not on file  . Highest education level: Not on file  Occupational History  . Not on file  Tobacco Use  . Smoking status: Former Smoker    Quit date: 1976    Years since quitting: 46.2  . Smokeless tobacco: Never Used  Vaping Use  . Vaping Use: Never used  Substance and Sexual Activity  . Alcohol use: Yes    Alcohol/week: 1.0 standard drink    Types: 1 Glasses of wine per week  . Drug use: No  . Sexual activity: Not Currently  Other Topics Concern  . Not on file  Social History Narrative  . Not on file   Social Determinants of Health   Financial Resource Strain: Not on file  Food Insecurity: Not on file  Transportation Needs: Not on file  Physical Activity: Not on file  Stress: Not on file  Social Connections: Not on file  Intimate Partner Violence: Not on file    Socially Susan [name] is married. There is no tobacco use. Susan [name] drinks alcohol rarely.  Family History  Problem Relation Age of Onset  . Heart disease Mother   . Breast cancer Mother 78  . Heart disease Father   . Diabetes Father   . Diabetes Brother   . Stroke Brother   . Kidney cancer Neg Hx   . Bladder Cancer Neg Hx     ROS General: Negative; No fevers, chills, or night sweats;  HEENT: Negative; No changes in vision or hearing, sinus congestion, difficulty swallowing Pulmonary: Negative; No cough, wheezing, shortness of breath,  hemoptysis Cardiovascular:  See HPI GI: Positive for GERD; No nausea, vomiting, diarrhea, or abdominal pain [id number] of previous kidney stones, contributing to left flank pain intermittently; No dysuria, hematuria, or difficulty voiding; radiation-induced urinary incontinence Musculoskeletal: Positive for small sacral fracture, L3-L5 degenerative disc disease. Hematologic/Oncology: Positive for endometrial cancer, status post chemotherapy and radiation treatments. Endocrine: Negative; no heat/cold intolerance; no diabetes Neuro: Negative; no changes in balance, headaches Skin: Negative; No rashes or skin lesions Psychiatric: Negative; No behavioral problems, depression Sleep: Positive for obstructive sleep apnea, on CPAP therapy.  No breakthrough snoring.  No daytime sleepiness. Other comprehensive 14 point system review is negative.   PE BP 122/68 (BP Location: Left Arm, [name] Position: Sitting)   Pulse 65   Ht '5\' 2"'  (1.575 m)   Wt 204 lb (92.5 kg)   SpO2 97%  BMI 37.31 kg/m    Repeat blood pressure by me was 122/70  Wt Readings from Last 3 Encounters:  06/07/20 204 lb (92.5 kg)  04/06/20 204 lb (92.5 kg)  03/08/20 204 lb (92.5 kg)   General: Alert, oriented, no distress.  Skin: normal turgor, no rashes, warm and dry HEENT: Normocephalic, atraumatic. Pupils equal round and reactive to light; sclera anicteric; extraocular muscles intact;  Nose without nasal septal hypertrophy Mouth/Parynx benign; Mallinpatti scale 3 Neck: No JVD, no carotid bruits; normal carotid upstroke Lungs: clear to ausculatation and percussion; no wheezing or rales Chest wall: without tenderness to palpitation Heart: PMI not displaced, RRR, s1 s2 normal, 1/6 systolic murmur, no diastolic murmur, no rubs, gallops, thrills, or heaves Abdomen: soft, nontender; no hepatosplenomehaly, BS+; abdominal aorta nontender and not dilated by palpation. Back: no CVA tenderness Pulses 2+ Musculoskeletal: full  range of motion, normal strength, no joint deformities Extremities: no clubbing cyanosis or edema, Homan's sign negative  Neurologic: grossly nonfocal; Cranial nerves grossly wnl Psychologic: Normal mood and affect   March 2021 ECG (independently read by me): Atrial fibrillation at 70; LBBB, QTc 453  June 2019 ECG (independently read by me): Atrial fibrillation with ventricular rate at 53 bpm.  Left bundle branch block with repolarization changes.  QTc interval 4 1 ms.  June 15, 2017 ECG (independently read by me): Atrial fibrillation at 67 bpm.  Left bundle branch block. Variable rate ranging from probably in Susan , low 40s to approximately 88  October 2018 ECG (independently read by me): Marked sinus bradycardia with PACs.  Ventricular rate at 43.  Left bundle branch block with repolarization changes.  March 2018 ECG (independently read by me): Sinus bradycardia 57 bpm.  First-degree AV block with a PR interval 314 ms. .Susan [name] now has a left bundle-branch block which is new since her prior tracing.  March 2017 ECG (independently read by me): Sinus bradycardia 52 bpm with first-degree block.  PR interval 284 ms.  LVH by voltage.  QS V1 through V3 concordant with old anteroseptal MI.  March 2016 ECG (independently read by me): Normal sinus rhythm.  Old anteroseptal MI.  First-degree AV block.  October 27, 2013 ECG (independently read by me): Normal sinus rhythm at 60 beats per minute.  First degree AV block with PR interval at 276 ms.  Old anteroseptal MI with QS complex in V1 and V2.  EKG (independently read by me): Sinus rhythm at 55 beats per minute. First degree AV block. Poor progression compatible with her prior septal infarction. T-wave changes anterolaterally.  LABS: BMP Latest Ref Rng & Units 12/15/2019 02/26/2019 12/16/2018  Glucose 70 - 99 mg/dL 116(H) 125(H) 137(H)  BUN 8 - 23 mg/dL '13 18 19  ' Creatinine 0.44 - 1.00 mg/dL 0.87 1.06(H) 0.96  Sodium 135 - 145 mmol/L 141 137 140   Potassium 3.5 - 5.1 mmol/L 3.8 3.7 3.8  Chloride 98 - 111 mmol/L 106 103 105  CO2 22 - 32 mmol/L '27 26 26  ' Calcium 8.9 - 10.3 mg/dL 9.4 9.5 9.5   Hepatic Function Latest Ref Rng & Units 12/15/2019 02/26/2019 12/16/2018  Total Protein 6.5 - 8.1 g/dL 7.1 6.9 7.0  Albumin 3.5 - 5.0 g/dL 4.0 4.0 4.0  AST 15 - 41 U/L '25 25 25  ' ALT 0 - 44 U/L '14 14 18  ' Alk Phosphatase 38 - 126 U/L 92 92 91  Total Bilirubin 0.3 - 1.2 mg/dL 0.8 0.9 0.7  Bilirubin, Direct 0.00 - 0.40 mg/dL - - -  CBC Latest Ref Rng & Units 12/15/2019 02/26/2019 12/16/2018  WBC 4.0 - 10.5 K/uL 6.1 5.6 6.0  Hemoglobin 12.0 - 15.0 g/dL 13.8 12.6 12.8  Hematocrit 36.0 - 46.0 % 40.6 38.7 38.1  Platelets 150 - 400 K/uL 222 213 198   Lab Results  Component Value Date   MCV 90.4 12/15/2019   MCV 93.7 02/26/2019   MCV 92.5 12/16/2018   Lab Results  Component Value Date   TSH 0.97 06/19/2016   Lab Results  Component Value Date   HGBA1C 5.7 (H) 10/19/2012   Lipid Panel     Component Value Date/Time   CHOL 116 06/06/2017 0922   CHOL 141 04/16/2013 0924   TRIG 75 06/06/2017 0922   TRIG 112 04/16/2013 0924   HDL 51 06/06/2017 0922   HDL 53 04/16/2013 0924   CHOLHDL 2.3 06/06/2017 0922   CHOLHDL 2.2 06/19/2016 0950   VLDL 20 06/19/2016 0950   LDLCALC 50 06/06/2017 0922   LDLCALC 66 04/16/2013 0924   RADIOLOGY: No results found.  IMPRESSION:  1. CAD in native artery   2. Permanent atrial fibrillation ([place])   3. Anticoagulation adequate   4. Old MI (myocardial infarction): Anterior STEMI 10/19/2012   5. OSA (obstructive sleep apnea)   6. Sick sinus syndrome ([place])   7. Hyperlipidemia with target LDL less than 70   8. Left bundle branch block     ASSESSMENT AND PLAN: Susan [name] who presented to [facility] on 10/19/2012 with a STEMI after an approximate 12 hour delay with back and chest discomfort. When Susan [name] arrived to [facility] Susan [name] had excellent door to balloon time at 27  minutes in Susan setting of an anterior wall ST segment elevation myocardial infarction. Susan [name] underwent successful stenting of her LAD and because of diffuse disease tandem DES stents were inserted.  Susan [name] underwent staged intervention to Susan circumflex  4 days later. Susan [name] had concomitant 50 - 60% RCA narrowing and a large dominant right coronary artery.  Susan [name] has a history of ACE inhibitor induced cough, but has tolerated ARB therapy with losartan .  A nuclear perfusion study  demonstrated significant salvage of myocardium in Susan majority of Susan LAD territory and showed distal apical scar.  Susan [name] has chronic left bundle branch block.  Susan [name] is now felt to have  permanent atrial fibrillation and has been on Eliquis for anticoagulation. Her cha2ds2vasc score is 4.  An echorevealed an EF of 40 to 45% with moderate LVH and septal and apical akinesis secondary to her prior late presentation anterior MI.  There was very mild aortic stenosis.  Susan [name] had moderate left atrial dilatation.  Presently, her blood pressure is stable and a repeat by me was 122/70 on her regimen of losartan 50 mg daily and Susan [name] continues to be on isosorbide.  Susan [name] is not having any recurrent anginal symptoms.  Susan [name] is unaware of any bleeding and is tolerating Eliquis 5 mg twice a day for her permanent atrial fibrillation.  Susan [name] continues to use CPAP with 100% compliance and AHI is excellent at 1.2 better 9 cm set pressure.  Susan [name] continues to be on atorvastatin 80 mg and Zetia 10 mg for hyperlipidemia.  Dr. Alain Honey has been checking laboratory.  Susan [name] continues to have issues with with neuropathy from her chemotherapy and has dry skin.  Susan [name] also has bladder problems resulting from her radiation treatment.  Susan [name] did undergo Botox treatments to her bladder.  Susan [name] will be  following up with her primary MD.  At present there is no need for pacemaker.  Susan [name] will continue current therapy as prescribed and I will see her in 1 year or sooner if issues arise.  Troy Sine,  MD, [facility]  06/09/2020 6:10 PM

## 2020-06-07 NOTE — Patient Instructions (Signed)
Medication Instructions:  No changes   *If you need a refill on your cardiac medications before your next appointment, please call your pharmacy*   Lab Work: Not needed   Testing/Procedures: Not needed   Follow-Up: At [facility], you and your health needs are our priority.  As part of our continuing mission to provide you with exceptional heart care, we have created designated Provider Care Teams.  These Care Teams include your primary Cardiologist (physician) and Advanced Practice Providers (APPs -  Physician Assistants and Nurse Practitioners) who all work together to provide you with the care you need, when you need it.     Your next appointment:   12 month(s)  The format for your next appointment:   In Person  Provider:   Thomas Kelly, MD     

## 2020-06-09 ENCOUNTER — Encounter: Payer: Self-pay | Admitting: Cardiovascular Disease

## 2020-06-13 ENCOUNTER — Other Ambulatory Visit: Payer: Self-pay | Admitting: Cardiovascular Disease

## 2020-07-21 ENCOUNTER — Ambulatory Visit
Admission: RE | Admit: 2020-07-21 | Discharge: 2020-07-21 | Disposition: A | Discharge status: Home / Self Care | Payer: Medicare PPO | Source: Ambulatory Visit | Attending: Family Medicine | Admitting: Family Medicine

## 2020-07-21 ENCOUNTER — Other Ambulatory Visit: Payer: Self-pay

## 2020-07-21 DIAGNOSIS — Z1231 Encounter for screening mammogram for malignant neoplasm of breast: Secondary | ICD-10-CM | POA: Diagnosis not present

## 2020-08-02 ENCOUNTER — Other Ambulatory Visit: Payer: Self-pay | Admitting: Cardiovascular Disease

## 2020-09-03 ENCOUNTER — Other Ambulatory Visit: Payer: Self-pay

## 2020-09-06 ENCOUNTER — Encounter: Payer: Self-pay | Admitting: Urology

## 2020-09-06 ENCOUNTER — Other Ambulatory Visit: Payer: Self-pay

## 2020-09-06 ENCOUNTER — Ambulatory Visit (INDEPENDENT_AMBULATORY_CARE_PROVIDER_SITE_OTHER): Payer: Medicare PPO | Admitting: Urology

## 2020-09-06 VITALS — BP 142/82 | HR 88 | Ht 62.0 in | Wt 204.0 lb

## 2020-09-06 DIAGNOSIS — N3946 Mixed incontinence: Secondary | ICD-10-CM | POA: Diagnosis not present

## 2020-09-06 DIAGNOSIS — G4733 Obstructive sleep apnea (adult) (pediatric): Secondary | ICD-10-CM | POA: Diagnosis not present

## 2020-09-06 NOTE — Progress Notes (Signed)
09/06/2020 11:31 AM   Susan [name] Susan [name] 13-Jul-1934 [id number]  Referring provider: [name], MD [phone] [place],  [place] [address]  Chief Complaint  Patient presents with   Urinary Incontinence    HPI: Reviewed history in detail.  Patient has had radiation.  We have talked about Botox with radiation.  We talked about InterStim and percutaneous tibial nerve stimulation..  She has had a lot of high-volume leakage especially triggered when she goes from a sitting to standing position.  She has been on Eliquis.  After the first bulking treatment she was dramatically better on Myrbetriq.  She was no longer having high-volume incontinence triggered by standing.  She was wearing 2 pads a day that were damp.  Blood thinner Eliquis and aspirin had been stopped prior.   Patient was dry for approximately 5 months after first bulking treatment.  She had a second bulking treatment April 22, 2020.  Today Frequency stable.  Still wears 5 pads a day.  She traveled to [place] and double pad for confidence.  She leaks primarily when she goes from a sitting to standing position.  She leaks more when she gets out of a pool.     PMH: Past Medical History:  Diagnosis Date   Bradycardia    Chronic back pain    Coronary artery disease    a. s/p STEMI in 2014 with DES to LAD with staged PCI/DES placement to LCx   Echoencephalogram abnormality    Heart murmur    History of Holter monitoring    Hyperlipidemia    Hypertension    Myocardial infarction ([place])    Neuromuscular disorder (H[facility])    neuropathy   NSVT (nonsustained ventricular tachycardia) ([place])    Personal history of chemotherapy    ENDOMETRIAL CA   Personal history of radiation therapy    ENDOMETRIAL CA   Uterine cancer ([place])    serrous    Surgical History: Past Surgical History:  Procedure Laterality Date   ABDOMINAL HYSTERECTOMY     BREAST EXCISIONAL BIOPSY Right 1956   NEG   CARDIOVERSION N/A 07/27/2017    Procedure: CARDIOVERSION;  Surgeon: Skeet Latch, MD;  Location: [place];  Service: Cardiovascular;  Laterality: N/A;   LEFT HEART CATHETERIZATION WITH CORONARY ANGIOGRAM  10/19/2012   Procedure: LEFT HEART CATHETERIZATION WITH CORONARY ANGIOGRAM;  Surgeon: Troy Sine, MD;  Location[facility] C[facility] LAB;  Service: Cardiovascular;;   PERCUTANEOUS CORONARY STENT INTERVENTION (PCI-S)  10/19/2012   Procedure: PERCUTANEOUS CORONARY STENT INTERVENTION (PCI-S);  Surgeon: Troy Sine, MD;  Location[facility] C[facility] LAB;  Service: Cardiovascular;;   PERCUTANEOUS CORONARY STENT INTERVENTION (PCI-S) N/A 10/23/2012   Procedure: PERCUTANEOUS CORONARY STENT INTERVENTION (PCI-S);  Surgeon: Troy Sine, MD;  Location: [facility] CATH LAB;  Service: Cardiovascular;  Laterality: N/A;   PERIPHERAL VASCULAR CATHETERIZATION N/A 05/05/2015   Procedure: Glori Luis Cath Insertion;  Surgeon: Algernon Huxley, MD;  Location: [place] CV LAB;  Service: Cardiovascular;  Laterality: N/A;   PORTA CATH REMOVAL N/A 04/05/2017   Procedure: PORTA CATH REMOVAL;  Surgeon: Algernon Huxley, MD;  Location: [place] CV LAB;  Service: Cardiovascular;  Laterality: N/A;    Home Medications:  Allergies as of 09/06/2020       Reactions   Ace Inhibitors Cough   Penicillins Rash, Other (See Comments)   Has patient had a PCN reaction causing immediate rash, facial/tongue/throat swelling, SOB or lightheadedness with hypotension: No Has patient had a PCN reaction causing severe rash involving  mucus membranes or skin necrosis: No Has patient had a PCN reaction that required hospitalization: No Has patient had a PCN reaction occurring within the last 10 years: No If all of the above answers are "NO", then may proceed with Cephalosporin use.        Medication List        Accurate as of September 06, 2020 11:31 AM. If you have any questions, ask your nurse or doctor.          STOP taking these medications    mirabegron ER 50 MG Tb24  tablet Commonly known as: MYRBETRIQ Stopped by: Reece Packer, MD   nortriptyline 10 MG capsule Commonly known as: PAMELOR Stopped by: Reece Packer, MD       TAKE these medications    aspirin EC 81 MG tablet Take 81 mg by mouth daily.   atorvastatin 80 MG tablet Commonly known as: LIPITOR TAKE 1 TABLET (80 MG TOTAL) BY MOUTH DAILY AT 6 PM.   Eliquis 5 MG Tabs tablet Generic drug: apixaban TAKE 1 TABLET BY MOUTH TWICE A DAY   ezetimibe 10 MG tablet Commonly known as: ZETIA TAKE 1 TABLET BY MOUTH EVERY DAY   fluticasone 50 MCG/ACT nasal spray Commonly known as: FLONASE SPRAY 2 SPRAYS INTO EACH NOSTRIL EVERY DAY   isosorbide mononitrate 30 MG 24 hr tablet Commonly known as: IMDUR TAKE 1 TABLET BY MOUTH EVERY DAY   loperamide 2 MG tablet Commonly known as: IMODIUM A-D Take 2 mg by mouth 4 (four) times daily as needed for diarrhea or loose stools.   losartan 25 MG tablet Commonly known as: COZAAR TAKE 2 TABLETS BY MOUTH EVERY DAY   multivitamin with minerals Tabs tablet Take 1 tablet by mouth daily.   Nitrostat 0.4 MG SL tablet Generic drug: nitroGLYCERIN PLACE 1 TABLET UNDER THE TOUGUE EVERY 5 MINUTES FOR UP TO 3 DOSES AS NEEDED FOR CHEST PAIN   omeprazole 20 MG capsule Commonly known as: PRILOSEC Take 20 mg by mouth daily.   vitamin B-12 1000 MCG tablet Commonly known as: CYANOCOBALAMIN Take 1,000 mcg by mouth daily.   vitamin C 1000 MG tablet Take 1,000 mg by mouth daily. Taking prior to PNS procedure.        Allergies:  Allergies  Allergen Reactions   Ace Inhibitors Cough   Penicillins Rash and Other (See Comments)    Has patient had a PCN reaction causing immediate rash, facial/tongue/throat swelling, SOB or lightheadedness with hypotension: No Has patient had a PCN reaction causing severe rash involving mucus membranes or skin necrosis: No Has patient had a PCN reaction that required hospitalization: No Has patient had a PCN  reaction occurring within the last 10 years: No If all of the above answers are "NO", then may proceed with Cephalosporin use.     Family History: Family History  Problem Relation Age of Onset   Heart disease Mother    Breast cancer Mother 74   Heart disease Father    Diabetes Father    Diabetes Brother    Stroke Brother    Kidney cancer Neg Hx    Bladder Cancer Neg Hx     Social History:  reports that she quit smoking about 46 years ago. Her smoking use included cigarettes. She has never used smokeless tobacco. She reports current alcohol use of about 1.0 standard drink of alcohol per week. She reports that she does not use drugs.  ROS:  Physical Exam: BP (!) 142/82   Pulse 88   Ht 5\' 2"  (1.575 m)   Wt 92.5 kg   BMI 37.31 kg/m   Constitutional:  Alert and oriented, No acute distress. HEENT: [place] AT, moist mucus membranes.  Trachea midline, no masses.  Laboratory Data: Lab Results  Component Value Date   WBC 6.1 12/15/2019   HGB 13.8 12/15/2019   HCT 40.6 12/15/2019   MCV 90.4 12/15/2019   PLT 222 12/15/2019    Lab Results  Component Value Date   CREATININE 0.87 12/15/2019    No results found for: PSA  No results found for: TESTOSTERONE  Lab Results  Component Value Date   HGBA1C 5.7 (H) 10/19/2012    Urinalysis    Component Value Date/Time   COLORURINE YELLOW (A) 02/20/2018 0905   APPEARANCEUR Cloudy (A) 04/14/2020 1143   LABSPEC 1.013 02/20/2018 0905   PHURINE 5.0 02/20/2018 0905   GLUCOSEU Negative 04/14/2020 1143   HGBUR MODERATE (A) 02/20/2018 0905   BILIRUBINUR Negative 04/14/2020 [place] 02/20/2018 0905   PROTEINUR Negative 04/14/2020 1143   PROTEINUR 30 (A) 02/20/2018 0905   NITRITE Negative 04/14/2020 1143   NITRITE NEGATIVE 02/20/2018 0905   LEUKOCYTESUR Trace (A) 04/14/2020 1143    Pertinent Imaging:   Assessment & Plan: Not convinced that treating  again with a bulking treatment is in the patient's best interest.  She is going to start percutaneous tibial nerve stimulation.  If this fails she can consider Botox in the face of radiation and InterStim.  I think we need to have reasonable treatment goals.  There are no diagnoses linked to this encounter.  No follow-ups on file.  Reece Packer, MD  [facility] [address], [place], [place] [address] [phone]

## 2020-09-08 ENCOUNTER — Other Ambulatory Visit: Payer: Self-pay | Admitting: Cardiovascular Disease

## 2020-09-14 ENCOUNTER — Telehealth: Payer: Self-pay

## 2020-09-14 ENCOUNTER — Other Ambulatory Visit: Payer: Self-pay | Admitting: Cardiovascular Disease

## 2020-09-14 DIAGNOSIS — I2510 Atherosclerotic heart disease of native coronary artery without angina pectoris: Secondary | ICD-10-CM

## 2020-09-14 NOTE — Telephone Encounter (Signed)
Trying to obtain a prior auth for PTNS for patient. Noticed that she has already had a 12 treatment PTNS series ending in October of 2019 in which she failed. Would you still like patient to try PTNS again or another alternative? Please advise.

## 2020-09-22 ENCOUNTER — Inpatient Hospital Stay: Payer: Medicare PPO | Attending: Obstetrics and Gynecology | Admitting: Nurse Practitioner

## 2020-09-22 VITALS — BP 145/80 | HR 81 | Temp 97.8°F | Resp 20 | Wt 207.5 lb

## 2020-09-22 DIAGNOSIS — C541 Malignant neoplasm of endometrium: Secondary | ICD-10-CM | POA: Diagnosis not present

## 2020-09-22 DIAGNOSIS — R32 Unspecified urinary incontinence: Secondary | ICD-10-CM | POA: Diagnosis not present

## 2020-09-22 DIAGNOSIS — Z90710 Acquired absence of both cervix and uterus: Secondary | ICD-10-CM | POA: Diagnosis not present

## 2020-09-22 DIAGNOSIS — Z9079 Acquired absence of other genital organ(s): Secondary | ICD-10-CM | POA: Diagnosis not present

## 2020-09-22 DIAGNOSIS — C55 Malignant neoplasm of uterus, part unspecified: Secondary | ICD-10-CM

## 2020-09-22 DIAGNOSIS — Z803 Family history of malignant neoplasm of breast: Secondary | ICD-10-CM | POA: Insufficient documentation

## 2020-09-22 DIAGNOSIS — I252 Old myocardial infarction: Secondary | ICD-10-CM | POA: Insufficient documentation

## 2020-09-22 DIAGNOSIS — Z90722 Acquired absence of ovaries, bilateral: Secondary | ICD-10-CM | POA: Insufficient documentation

## 2020-09-22 DIAGNOSIS — Z9221 Personal history of antineoplastic chemotherapy: Secondary | ICD-10-CM | POA: Diagnosis not present

## 2020-09-22 DIAGNOSIS — Z923 Personal history of irradiation: Secondary | ICD-10-CM | POA: Insufficient documentation

## 2020-09-22 DIAGNOSIS — T451X5A Adverse effect of antineoplastic and immunosuppressive drugs, initial encounter: Secondary | ICD-10-CM | POA: Insufficient documentation

## 2020-09-22 DIAGNOSIS — M549 Dorsalgia, unspecified: Secondary | ICD-10-CM | POA: Insufficient documentation

## 2020-09-22 DIAGNOSIS — Z87891 Personal history of nicotine dependence: Secondary | ICD-10-CM | POA: Diagnosis not present

## 2020-09-22 DIAGNOSIS — Z8542 Personal history of malignant neoplasm of other parts of uterus: Secondary | ICD-10-CM | POA: Insufficient documentation

## 2020-09-22 DIAGNOSIS — G620 Drug-induced polyneuropathy: Secondary | ICD-10-CM | POA: Diagnosis not present

## 2020-09-22 NOTE — Progress Notes (Signed)
[place]  [phone] [phone]  Patient Care Team: [name], MD as PCP - General (Family Medicine) Constance Haw, MD as PCP - Electrophysiology (Cardiology) Troy Sine, MD as PCP - Cardiology (Cardiology) Mellody Drown, MD as Referring Physician (Obstetrics and Gynecology)   Name of the patient: Susan [name]  [id number]  Mar 03, 1935   Date of visit: 09/22/2020   Oncology History:  Oncology History Overview Note  Endometrial cancer stage IIIc     Carcinoma of endometrium ([place])  04/02/2015 Initial Diagnosis   Carcinoma of endometrium [facility])      Gynecologic Oncology Interval Visit   Referring Provider: Dr Joylene Igo  Chief Concern: stage IIIC1serous endometrial cancer  Subjective:  Susan [name] is a 86 y.o., P3 female, initially seen in consultation from Dr. 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   She was last seen in clinic 09/17/2019. Since this time she reports ongoing urine leaking, and botox treatment that was unsuccessful with urology in Jan 2022. She is no longer taking the Mrybetriq because she reports this medication was not helping her symptoms.   She additionally reports on-going peripheral neuropathy in her feet, with complaints of numbness only. No vaginal bleeding, pain, or discharge. She feels well and denies other specific complaints.   07/14/2019- UroGynecology, Tonye Becket, NP; unable to fit pessary d/t radiation changes. Follow up with Dr. Stephanie Coup for urethral bulking was recommending. Continue to be followed by Dr. MacDiarmid/urology.   Mammogram was 07/21/2020- negative. Repeat in 07/21/21    Gynecologic Oncology History  Susan [name]  Susan [name] is a 86 y.o. 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  Please see complete details in prior notes.   Path report below shows stage IIIC1 disease.  Washings were negative, but she had 3 positive SLNs in pelvis.  An aortic SLN was negative.     A. Lymph node, sentinel, right external iliac, excision:  Minute focus of metastatic carcinoma involving one lymph node (1/1). See note. Size of metastasis: Less than 0.1 mm. Negative for extranodal invasion.   Note: A cytokeratin AE1/3 highlights the metastatic focus.   B. Lymph node, sentinel, right external iliac #2, excision: Metastatic carcinoma involving one lymph node (1/1). See note. Size of metastasis: 0.5 mm. Negative for extranodal invasion. Note: A cytokeratin AE1/3 highlights the metastatic focus.   C. Lymph node, sentinel, right aortic, excision: One benign lymph node (0/1). Note: A cytokeratin AE1/3 immunostain is negative.    D. Lymph node, sentinel, left external iliac, excision:  Metastatic adenocarcinoma involving one lymph node (1/1).   Size of metastasis: 1 cm. Negative for extranodal invasion. Note: A cytokeratin AE1/3 highlights the metastatic focus.   E. 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  Tumor size: 5.1 cm. The tumor invades 2 mm into a 14 mm thick myometrium. Negative for cervical involvement. Lymphovascular invasion is present.   04/28/2015 CA125 82.5   Recommendation to proceed with with carboplatinum and paclitaxel three-four cycles followed by radiation therapy followed by additional 2-3 cycles of chemotherapy. She started on chemotherapy on 05/12/2015 and received 2nd cycle  on 06/02/2015. She developed significant neuropathy impairing her  quality of life with complaints of sensory ataxia, frequent falls, and needs to walk with the help of walker. Chemotherapy was discontinued. Then received 4500 cGy pelvic radiation and 1200 cGy using high dose rate remote afterloading through vaginal cylinder completed 7/17.  08/31/2016 Abdominal and pelvic CT scan revealed no evidence of recurrent disease.  There was a new sacral insufficiency fracture.  She was referred to surgery.  Scans revealed a 3 mm pulmonary nodule.  Repeat chest CT in 3 months was recommended.  Pelvic MRI on 10/23/2016 revealed bilateral sacral insufficiency fractures with possible insufficiency fractures of the left greater than right bilateral iliac wings.  Lumbar spine MRI revealed diffuse degenerative disease.  Decision was made not to perform a sacral plasty.  She was seen by Dr. Deetta Perla on 11/07/2016.  Surgical decompression or fusion was not recommended. Recommendation was made to the pain clinic for treatment (medication, injections, or spinal cord stimulator). She is currently seeing a chiropractor and has had 6 treatments.  Chest CT on 12/05/2016 revealed scattered right lung nodules measuring up to 6 mm (mean diameter) in the right upper lobe along the minor fissure.  Metastatic disease was not excluded.  Recommendation was for follow-up CT in 3-6 months.   CA125 values:  04/28/2015  82.5  06/02/2015  22.0 07/14/2015  18.2 10/21/2015  11.7 01/21/2016  16.4 04/25/2016  14.2 08/15/2016  13.6 12/07/2016   16.2 03/08/2017  16.1 09/17/2019  14.9 12/15/2019  16.5  Seen in clinic on 02/21/2017 by Dr. Theora Gianotti with negative exam.   She has seen Dr. Mike Gip for routine follow-up and had a negative exam. She has had her port removed. She continues to be followed by cardiology for bradycardia. She declined a pacemaker at that time for asymptomatic bradycardia. She developed atrial fibrillation and underwent electrical cardioversion on 07/27/17. She has known lung nodules  which showed:   03/06/2017 CT Chest WO Contrast: - stable tiny sub-cm right lung nodules   CT C/A/P 12/12/17- performed for back pain; stable pulmonary nodules. No evidence of metastatic or recurrent disease elsewhere. Aortic atherosclerosis. Degenerative changes in thoracolumbar spine. Mild sclerosis in sacrum.    Uro-Gyn History She had a prior history of urinary incontinence and was seen by Dr. Matilde Sprang, Urology on 08/13/17. They discussed percutaneous tibial nerve stimulation.  02/26/2019-  continued bladder leakage. She was referred to [place] after this visit.     Problem List: Patient Active Problem List   Diagnosis Date Noted   Lumbar degenerative disc disease 10/22/2019   Lumbar facet arthropathy 09/04/2019   Chronic pain syndrome 09/04/2019   Chemotherapy-induced neuropathy ([place]) 04/17/2018   Neuropathy 04/17/2018   Chemotherapy-induced peripheral neuropathy ([place]) 03/01/2018   Persistent atrial fibrillation ([place])    Port-A-Cath in place 03/28/2017   Bradycardia 01/18/2017   Obesity (BMI 30-39.9) 01/17/2017   Pulmonary nodules 12/05/2016   Compression fracture of L5 vertebra with delayed healing 11/01/2016   Sacral insufficiency fracture with delayed healing 11/01/2016   Sacral insufficiency fracture 09/05/2016   Absence of bladder continence 07/21/2015   Neuropathy associated with cancer ([place]) 06/02/2015   Carcinoma of endometrium ([place]) 04/02/2015   Gastro-esophageal reflux disease without esophagitis 02/15/2015   Osteopenia 08/17/2014   Hyperlipidemia 02/13/2014   Kidney stones 10/27/2013   CA skin, basal cell 06/30/2013   Spinal stenosis, lumbar region, with neurogenic claudication 06/30/2013   Arthritis, degenerative 06/30/2013   Neuralgia neuritis, sciatic nerve 06/30/2013   Coronary  artery disease 10/25/2012   Dyslipidemia 10/25/2012   NSVT (nonsustained ventricular tachycardia) ([place]) 10/25/2012   Sick sinus syndrome ([place]) 10/25/2012   Ischemic  cardiomyopathy 10/22/2012   Old anterior wall myocardial infarction 10/20/2012   Hypertensive heart disease without CHF 10/20/2012    Past Medical History: Past Medical History:  Diagnosis Date   Bradycardia    Chronic back pain    Coronary artery disease    a. s/p STEMI in 2014 with DES to LAD with staged PCI/DES placement to LCx   Echoencephalogram abnormality    Heart murmur    History of Holter monitoring    Hyperlipidemia    Hypertension    Myocardial infarction ([place])    Neuromuscular disorder (HCC)    neuropathy   NSVT (nonsustained ventricular tachycardia) (HCC)    Personal history of chemotherapy    ENDOMETRIAL CA   Personal history of radiation therapy    ENDOMETRIAL CA   Uterine cancer ([place])    serrous    Past Surgical History: Past Surgical History:  Procedure Laterality Date   ABDOMINAL HYSTERECTOMY     BREAST EXCISIONAL BIOPSY Right 1956   NEG   CARDIOVERSION N/A 07/27/2017   Procedure: CARDIOVERSION;  Surgeon: Skeet Latch, MD;  Location: [facility];  Service: Cardiovascular;  Laterality: N/A;   LEFT HEART CATHETERIZATION WITH CORONARY ANGIOGRAM  10/19/2012   Procedure: LEFT HEART CATHETERIZATION WITH CORONARY ANGIOGRAM;  Surgeon: Troy Sine, MD;  Location: [facility] CATH LAB;  Service: Cardiovascular;;   PERCUTANEOUS CORONARY STENT INTERVENTION (PCI-S)  10/19/2012   Procedure: PERCUTANEOUS CORONARY STENT INTERVENTION (PCI-S);  Surgeon: Troy Sine, MD;  Location: [facility] CATH LAB;  Service: Cardiovascular;;   PERCUTANEOUS CORONARY STENT INTERVENTION (PCI-S) N/A 10/23/2012   Procedure: PERCUTANEOUS CORONARY STENT INTERVENTION (PCI-S);  Surgeon: Troy Sine, MD;  Location: [facility] CATH LAB;  Service: Cardiovascular;  Laterality: N/A;   PERIPHERAL VASCULAR CATHETERIZATION N/A 05/05/2015   Procedure: Glori Luis Cath Insertion;  Surgeon: Algernon Huxley, MD;  Location: [facility] CV LAB;  Service: Cardiovascular;  Laterality: N/A;   PORTA CATH REMOVAL N/A 04/05/2017    Procedure: PORTA CATH REMOVAL;  Surgeon: Algernon Huxley, MD;  Location: [facility] CV LAB;  Service: Cardiovascular;  Laterality: N/A;    Family History: Family History  Problem Relation Age of Onset   Heart disease Mother    Breast cancer Mother 25   Heart disease Father    Diabetes Father    Diabetes Brother    Stroke Brother    Kidney cancer Neg Hx    Bladder Cancer Neg Hx     Social History: Social History   Socioeconomic History   Marital status: Widowed    Spouse name: Not on file   Number of children: Not on file   Years of education: Not on file   Highest education level: Not on file  Occupational History   Not on file  Tobacco Use   Smoking status: Former    Pack years: 0.00    Types: Cigarettes    Quit date: 80    Years since quitting: 46.5   Smokeless tobacco: Never  Vaping Use   Vaping Use: Never used  Substance and Sexual Activity   Alcohol use: Yes    Alcohol/week: 1.0 standard drink    Types: 1 Glasses of wine per week   Drug use: No   Sexual activity: Not Currently  Other Topics Concern   Not on file  Social History Narrative   Not on  file   Social Determinants of Health   Financial Resource Strain: Not on file  Food Insecurity: Not on file  Transportation Needs: Not on file  Physical Activity: Not on file  Stress: Not on file  Social Connections: Not on file  Intimate Partner Violence: Not on file    Allergies: Allergies  Allergen Reactions   Ace Inhibitors Cough   Penicillins Rash and Other (See Comments)    Has patient had a PCN reaction causing immediate rash, facial/tongue/throat swelling, SOB or lightheadedness with hypotension: No Has patient had a PCN reaction causing severe rash involving mucus membranes or skin necrosis: No Has patient had a PCN reaction that required hospitalization: No Has patient had a PCN reaction occurring within the last 10 years: No If all of the above answers are "NO", then may proceed with  Cephalosporin use.     Current Medications: Current Outpatient Medications  Medication Instructions   aspirin EC 81 mg, Daily   atorvastatin (LIPITOR) 80 MG tablet TAKE 1 TABLET (80 MG TOTAL) BY MOUTH DAILY AT 6 PM.   ELIQUIS 5 MG TABS tablet TAKE 1 TABLET BY MOUTH TWICE A DAY   ezetimibe (ZETIA) 10 MG tablet TAKE 1 TABLET BY MOUTH EVERY DAY   fluticasone (FLONASE) 50 MCG/ACT nasal spray SPRAY 2 SPRAYS INTO EACH NOSTRIL EVERY DAY   isosorbide mononitrate (IMDUR) 30 MG 24 hr tablet TAKE 1 TABLET BY MOUTH EVERY DAY   loperamide (IMODIUM A-D) 2 mg, Oral, 4 times daily PRN   losartan (COZAAR) 25 MG tablet TAKE 2 TABLETS BY MOUTH EVERY DAY   Multiple Vitamin (MULTIVITAMIN WITH MINERALS) TABS 1 tablet, Daily   NITROSTAT 0.4 MG SL tablet PLACE 1 TABLET UNDER THE TOUGUE EVERY 5 MINUTES FOR UP TO 3 DOSES AS NEEDED FOR CHEST PAIN   omeprazole (PRILOSEC) 20 mg, Daily   vitamin B-12 (CYANOCOBALAMIN) 1,000 mcg, Oral, Daily   vitamin C 1,000 mg, Oral, Daily, Taking prior to PNS procedure.     Review of Systems  Constitutional:  Negative for fever, malaise/fatigue and weight loss.  HENT:  Negative for congestion and hearing loss.   Eyes:  Negative for blurred vision and double vision.  Respiratory:  Negative for cough.   Cardiovascular:  Negative for chest pain and palpitations.  Gastrointestinal:  Negative for abdominal pain, constipation, diarrhea, nausea and vomiting.  Genitourinary:  Negative for frequency and urgency.       Reports incontinence, urine leakage; on-going   Skin:  Negative for rash.  Neurological:  Negative for dizziness, tingling, focal weakness and headaches.       Reports numbness in bilateral feet  Endo/Heme/Allergies:  Does not bruise/bleed easily.  Psychiatric/Behavioral:  Negative for depression. The patient is not nervous/anxious and does not have insomnia.      Objective:  Physical Examination:  BP (!) 145/80   Pulse 81   Temp 97.8 F (36.6 C)   Resp 20   Wt  207 lb 8 oz (94.1 kg)   SpO2 100%   BMI 37.95 kg/m    ECOG Performance Status: 1 - Symptomatic but completely ambulatory  GENERAL: Patient is a well appearing female in no acute distress HEENT:  Sclera clear. Anicteric NODES:  Negative axillary, supraclavicular, inguinal lymph node survery LUNGS:  Clear to auscultation bilaterally.   HEART:  Regular rate and rhythm.  ABDOMEN:  Soft, nontender.  No hernias, incisions well healed. No masses or ascites EXTREMITIES:  No peripheral edema. Atraumatic. No cyanosis SKIN:  Clear with  no obvious rashes or skin changes.  NEURO:  Nonfocal. Well oriented.  Appropriate affect.  PELVIC: Exam Chaperoned by RN.  Vulva: normal appearing vulva with no masses, tenderness or lesions; Urinary leakage.  Vagina: atrophic; no lesions, vaginal shortening;  BME: no masses;  Uterus/Cervix:surgically absent Rectal: deferred   Assessment:  Susan [name] is a 85 y.o. 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  CT scan 9/19 normal and no change in indeterminate lung lesions. NED today on exam. She is no longer followed by medical oncology or radiation oncology.   Urinary incontinence - related to radiation and post operative changes. Managed by UroGyn at [facility] and Urology/Dr. Matilde Sprang.   Peripheral neuropathy- likely due to taxane chemotherapy. Followed by neurology.    Back pain: Previous imaging revealed DDD. Chronic & unchanged.   Plan: Continue surveillance- She is now 5 years out from diagnosis. NCCN guidelines reviewed today. She will continue annual surveillance visits with pelvic exams. CA 125 is good marker for her and would benefit from being followed. Most  recently was 16.5 (12/15/19). Labs not drawn today. Will call patient and recommend that we check ca-125. Will check ca-125 yearly with her surveillance visits. Screening mammograms and other health screenings to be scheduled by Dr. Lovie Macadamia, patient's pcp.   Urinary incontinence- continue follow up with uro-gyn and urology  CIPN- information regarding acupuncture provided to patient today. Continue follow up with Dr. Potter/Neurology  Back pain: continue follow up with PCP, Rehabilitation/PT  She will continue follow up with Dr. Tasia Catchings at the [facility].    RTC in 1 year to see [place].  Beckey Rutter, DNP, AGNP-C [place] [facility] [phone] (clinic)

## 2020-09-22 NOTE — Patient Instructions (Signed)
As we discussed, you may notice some improvement in your symptoms with acupuncture. Below is the names of some local clinics that offer our cancer survivors a discounted rate of $40/session (or at least this is the last rate I'm aware of). You can contact them for an appointment at your convenience.  - [facility]) or [phone] [facility])  Always great to see you and thank you for allowing [place] to participate in your care. Enjoy lots of Charlie snuggles!

## 2020-09-30 ENCOUNTER — Inpatient Hospital Stay: Payer: Medicare PPO | Attending: Nurse Practitioner

## 2020-09-30 ENCOUNTER — Other Ambulatory Visit: Payer: Self-pay

## 2020-09-30 DIAGNOSIS — C541 Malignant neoplasm of endometrium: Secondary | ICD-10-CM | POA: Insufficient documentation

## 2020-10-01 LAB — CA 125: Cancer Antigen (CA) 125: 14.7 U/mL (ref 0.0–38.1)

## 2020-10-26 NOTE — Telephone Encounter (Signed)
Given phone to crysta.

## 2020-10-27 ENCOUNTER — Telehealth: Payer: Self-pay

## 2020-10-27 NOTE — Telephone Encounter (Signed)
Spoke with Butch Penny at [place], patient approved for 12 treatments under CPT [phone] from 10/18/20 through 10/17/21. Auth # [id number].   Will contact patient to set up 12 treatment appts.

## 2020-10-27 NOTE — Telephone Encounter (Signed)
-----   Message from Chrystie Nose, [place] sent at 09/06/2020 12:44 PM EDT ----- Regarding: FW: PTNS  ----- Message ----- From: Shanon Ace, CMA Sent: 09/06/2020  11:46 AM EDT To: Chrystie Nose, CMA Subject: PTNS                                           Hey!  She would like to be setup for PTNS.  Thank you Adria Devon

## 2020-11-04 NOTE — Telephone Encounter (Signed)
LMOM for patient to return call to get PTNS appts scheduled.

## 2020-11-15 ENCOUNTER — Other Ambulatory Visit: Payer: Self-pay

## 2020-11-15 ENCOUNTER — Ambulatory Visit (INDEPENDENT_AMBULATORY_CARE_PROVIDER_SITE_OTHER): Payer: Medicare PPO | Admitting: Family Medicine

## 2020-11-15 DIAGNOSIS — N3946 Mixed incontinence: Secondary | ICD-10-CM | POA: Diagnosis not present

## 2020-11-15 NOTE — Patient Instructions (Signed)
Tracking Your Bladder Symptoms    Patient Name:___________________________________________________   Sample: Day   Daytime Voids  Nighttime Voids Urgency for the Day(0-4) Number of Accidents Beverage Comments  Monday IIII II 2 I Water IIII Coffee  I      Week Starting:____________________________________   Day Daytime  Voids Nighttime  Voids Urgency for  The Day(0-4) Number of Accidents Beverages Comments                                                           This week my symptoms were:  O much better  O better O the same O worse   

## 2020-11-15 NOTE — Progress Notes (Signed)
PTNS  Session # restart therapy #1 (13 of 45)  Health & Social Factors: no change Caffeine: 2   Alcohol: 0-1 Daytime voids #per day: 4-5 Night-time voids #per night: 2 Urgency: strong Incontinence Episodes #per day: 5 Ankle used: right Treatment Setting: 11 Feeling/ Response: sensory Comments: patient tolerated well  Performed By: Elberta Leatherwood, CMA  Follow Up: 1 week #2 (14 of 45)

## 2020-11-22 ENCOUNTER — Other Ambulatory Visit: Payer: Self-pay

## 2020-11-22 ENCOUNTER — Ambulatory Visit (INDEPENDENT_AMBULATORY_CARE_PROVIDER_SITE_OTHER): Payer: Medicare PPO | Admitting: Family Medicine

## 2020-11-22 DIAGNOSIS — N3946 Mixed incontinence: Secondary | ICD-10-CM

## 2020-11-22 NOTE — Progress Notes (Signed)
PTNS  Session # 2 (14 of 45)  Health & Social Factors: no change Caffeine: 2 Alcohol: 0 Daytime voids #per day: 5 Night-time voids #per night: 2 Urgency: mild Incontinence Episodes #per day: 1 Ankle used: left Treatment Setting: 19 Feeling/ Response: sensory Comments: Patient tolerated well  Performed By: Elberta Leatherwood, CMA  Follow Up: 1 week #3 (15 of 45)

## 2020-11-22 NOTE — Patient Instructions (Signed)
Tracking Your Bladder Symptoms    Patient Name:___________________________________________________   Sample: Day   Daytime Voids  Nighttime Voids Urgency for the Day(0-4) Number of Accidents Beverage Comments  Monday IIII II 2 I Water IIII Coffee  I      Week Starting:____________________________________   Day Daytime  Voids Nighttime  Voids Urgency for  The Day(0-4) Number of Accidents Beverages Comments                                                           This week my symptoms were:  O much better  O better O the same O worse   

## 2020-11-26 DIAGNOSIS — G8929 Other chronic pain: Secondary | ICD-10-CM | POA: Diagnosis not present

## 2020-11-26 DIAGNOSIS — J309 Allergic rhinitis, unspecified: Secondary | ICD-10-CM | POA: Diagnosis not present

## 2020-11-26 DIAGNOSIS — R32 Unspecified urinary incontinence: Secondary | ICD-10-CM | POA: Diagnosis not present

## 2020-11-26 DIAGNOSIS — G620 Drug-induced polyneuropathy: Secondary | ICD-10-CM | POA: Diagnosis not present

## 2020-11-26 DIAGNOSIS — K219 Gastro-esophageal reflux disease without esophagitis: Secondary | ICD-10-CM | POA: Diagnosis not present

## 2020-11-26 DIAGNOSIS — I4891 Unspecified atrial fibrillation: Secondary | ICD-10-CM | POA: Diagnosis not present

## 2020-11-26 DIAGNOSIS — T451X5S Adverse effect of antineoplastic and immunosuppressive drugs, sequela: Secondary | ICD-10-CM | POA: Diagnosis not present

## 2020-11-26 DIAGNOSIS — I700 Atherosclerosis of aorta: Secondary | ICD-10-CM | POA: Diagnosis not present

## 2020-11-26 DIAGNOSIS — Z604 Social exclusion and rejection: Secondary | ICD-10-CM | POA: Diagnosis not present

## 2020-11-26 DIAGNOSIS — Z6839 Body mass index (BMI) 39.0-39.9, adult: Secondary | ICD-10-CM | POA: Diagnosis not present

## 2020-11-26 DIAGNOSIS — I10 Essential (primary) hypertension: Secondary | ICD-10-CM | POA: Diagnosis not present

## 2020-11-26 DIAGNOSIS — G4733 Obstructive sleep apnea (adult) (pediatric): Secondary | ICD-10-CM | POA: Diagnosis not present

## 2020-11-26 DIAGNOSIS — M1990 Unspecified osteoarthritis, unspecified site: Secondary | ICD-10-CM | POA: Diagnosis not present

## 2020-11-26 DIAGNOSIS — M545 Low back pain, unspecified: Secondary | ICD-10-CM | POA: Diagnosis not present

## 2020-11-26 DIAGNOSIS — E785 Hyperlipidemia, unspecified: Secondary | ICD-10-CM | POA: Diagnosis not present

## 2020-11-26 DIAGNOSIS — H269 Unspecified cataract: Secondary | ICD-10-CM | POA: Diagnosis not present

## 2020-11-26 DIAGNOSIS — Z7901 Long term (current) use of anticoagulants: Secondary | ICD-10-CM | POA: Diagnosis not present

## 2020-11-30 ENCOUNTER — Ambulatory Visit (INDEPENDENT_AMBULATORY_CARE_PROVIDER_SITE_OTHER): Payer: Medicare PPO | Admitting: *Deleted

## 2020-11-30 ENCOUNTER — Other Ambulatory Visit: Payer: Self-pay

## 2020-11-30 DIAGNOSIS — N3946 Mixed incontinence: Secondary | ICD-10-CM

## 2020-11-30 NOTE — Progress Notes (Signed)
PTNS   Session #  3(15 of 45)   Health & Social Factors: no change Caffeine: 2 Alcohol: 0 Daytime voids #per day: 5 Night-time voids #per night: 3 Urgency: mild Incontinence Episodes #per day: 2 Ankle used: Left  Treatment Setting: 9 Feeling/ Response: sensory Comments: Patient tolerated well   Performed By: Gaspar Cola  CMA   Follow Up: 1 week #4 (16 of 45)

## 2020-11-30 NOTE — Patient Instructions (Signed)
Tracking Your Bladder Symptoms    Patient Name:___________________________________________________   Sample: Day   Daytime Voids  Nighttime Voids Urgency for the Day(0-4) Number of Accidents Beverage Comments  Monday IIII II 2 I Water IIII Coffee  I      Week Starting:____________________________________   Day Daytime  Voids Nighttime  Voids Urgency for  The Day(0-4) Number of Accidents Beverages Comments                                                           This week my symptoms were:  O much better  O better O the same O worse   

## 2020-12-06 ENCOUNTER — Ambulatory Visit (INDEPENDENT_AMBULATORY_CARE_PROVIDER_SITE_OTHER): Payer: Medicare PPO | Admitting: Family Medicine

## 2020-12-06 ENCOUNTER — Other Ambulatory Visit: Payer: Self-pay

## 2020-12-06 DIAGNOSIS — N3946 Mixed incontinence: Secondary | ICD-10-CM

## 2020-12-06 NOTE — Progress Notes (Signed)
PTNS  Session # 4 (16 of 45)  Health & Social Factors: no change Caffeine: 2 Alcohol: 0 Daytime voids #per day: 5 Night-time voids #per night: 3 Urgency: mild Incontinence Episodes #per day: 2 Ankle used: right Treatment Setting: 8 Feeling/ Response: Sensory Comments: Patient tolerated well  Performed By: Elberta Leatherwood, CMA  Follow Up: 1 week #5 (17 of 45)

## 2020-12-13 ENCOUNTER — Other Ambulatory Visit: Payer: Self-pay

## 2020-12-13 ENCOUNTER — Ambulatory Visit (INDEPENDENT_AMBULATORY_CARE_PROVIDER_SITE_OTHER): Payer: Medicare PPO | Admitting: *Deleted

## 2020-12-13 DIAGNOSIS — N3946 Mixed incontinence: Secondary | ICD-10-CM | POA: Diagnosis not present

## 2020-12-13 NOTE — Progress Notes (Signed)
PTNS  Session # 5 (17 of 45)  Health & Social Factors: No change Caffeine: 2 Alcohol: 0 Daytime voids #per day: 5-6 Night-time voids #per night: 1 Urgency: mild Incontinence Episodes #per day: 1-2 Ankle used: Left Treatment Setting: 4 Feeling/ Response: Sensory Comments: Tolerated well  Performed By: Verlene Mayer, [place], CMA   Follow Up: One week as scheduled

## 2020-12-13 NOTE — Patient Instructions (Signed)
Tracking Your Bladder Symptoms    Patient Name:___________________________________________________   Sample: Day   Daytime Voids  Nighttime Voids Urgency for the Day(0-4) Number of Accidents Beverage Comments  Monday IIII II 2 I Water IIII Coffee  I      Week Starting:____________________________________   Day Daytime  Voids Nighttime  Voids Urgency for  The Day(0-4) Number of Accidents Beverages Comments                                                           This week my symptoms were:  O much better  O better O the same O worse   

## 2020-12-20 ENCOUNTER — Other Ambulatory Visit: Payer: Self-pay | Admitting: Cardiovascular Disease

## 2020-12-20 ENCOUNTER — Other Ambulatory Visit: Payer: Self-pay

## 2020-12-20 ENCOUNTER — Ambulatory Visit (INDEPENDENT_AMBULATORY_CARE_PROVIDER_SITE_OTHER): Payer: Medicare PPO | Admitting: Family Medicine

## 2020-12-20 DIAGNOSIS — N3946 Mixed incontinence: Secondary | ICD-10-CM | POA: Diagnosis not present

## 2020-12-20 NOTE — Telephone Encounter (Signed)
Prescription refill request for Eliquis received. Indication:afib Last office visit:kelly 06/07/20 Scr:0.8 01/19/20 Age: 46f Weight:94.1kg

## 2020-12-20 NOTE — Progress Notes (Signed)
PTNS  Session # 6 (18 of 45)  Health & Social Factors: no change Caffeine: 2 Alcohol: 0 Daytime voids #per day: 5 Night-time voids #per night: 1 Urgency: mild Incontinence Episodes #per day: 1 Ankle used: left Treatment Setting: 10 Feeling/ Response: sensory Comments: patient tolerated well  Performed By: Elberta Leatherwood, CMA  Follow Up: 1 week #7 (19 of 45)

## 2020-12-20 NOTE — Patient Instructions (Signed)
Tracking Your Bladder Symptoms    Patient Name:___________________________________________________   Sample: Day   Daytime Voids  Nighttime Voids Urgency for the Day(0-4) Number of Accidents Beverage Comments  Monday IIII II 2 I Water IIII Coffee  I      Week Starting:____________________________________   Day Daytime  Voids Nighttime  Voids Urgency for  The Day(0-4) Number of Accidents Beverages Comments                                                           This week my symptoms were:  O much better  O better O the same O worse   

## 2020-12-23 DIAGNOSIS — M545 Low back pain, unspecified: Secondary | ICD-10-CM | POA: Diagnosis not present

## 2020-12-23 DIAGNOSIS — G629 Polyneuropathy, unspecified: Secondary | ICD-10-CM | POA: Diagnosis not present

## 2020-12-23 DIAGNOSIS — M79605 Pain in left leg: Secondary | ICD-10-CM | POA: Diagnosis not present

## 2020-12-23 DIAGNOSIS — M79604 Pain in right leg: Secondary | ICD-10-CM | POA: Diagnosis not present

## 2020-12-23 DIAGNOSIS — R262 Difficulty in walking, not elsewhere classified: Secondary | ICD-10-CM | POA: Diagnosis not present

## 2021-01-03 ENCOUNTER — Ambulatory Visit (INDEPENDENT_AMBULATORY_CARE_PROVIDER_SITE_OTHER): Payer: Medicare PPO | Admitting: Physician Assistant

## 2021-01-03 ENCOUNTER — Other Ambulatory Visit: Payer: Self-pay

## 2021-01-03 DIAGNOSIS — N3946 Mixed incontinence: Secondary | ICD-10-CM

## 2021-01-03 DIAGNOSIS — R300 Dysuria: Secondary | ICD-10-CM

## 2021-01-03 LAB — URINALYSIS, COMPLETE
Bilirubin, UA: NEGATIVE
Ketones, UA: NEGATIVE
Nitrite, UA: POSITIVE — AB
Specific Gravity, UA: 1.015 (ref 1.005–1.030)
Urobilinogen, Ur: 1 mg/dL (ref 0.2–1.0)
pH, UA: 5.5 (ref 5.0–7.5)

## 2021-01-03 LAB — MICROSCOPIC EXAMINATION: WBC, UA: >30 /hpf — ABNORMAL HIGH (ref 0–5)

## 2021-01-03 MED ORDER — SULFAMETHOXAZOLE-TRIMETHOPRIM 800-160 MG PO TABS: 1.0000 | ORAL_TABLET | Freq: Two times a day (BID) | ORAL | 0 refills | Status: AC

## 2021-01-03 NOTE — Progress Notes (Signed)
01/03/2021 12:54 PM   Susan [name] September 09, 1934 [id number]  CC: Chief Complaint  Patient presents with   PTNS   HPI: Susan [name] is a 86 y.o. 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 Matilde Sprang who presents today for scheduled PTNS and with concerns for acute UTI.   Today she reports a 1 day history of upper body spasms with urination without dysuria.  She denies fever, chills, nausea, vomiting, and gross hematuria.  She reports back pain at baseline, stable.  She took Azo 3 times yesterday, which helped her symptoms.  In-office UA today positive for trace glucose, 3+ blood, 3+ protein, nitrites, and 3+ leukocyte esterase; urine microscopy with >30 WBCs/HPF and 11-30 RBCs/HPF.  PMH: Past Medical History:  Diagnosis Date   Bradycardia    Chronic back pain    Coronary artery disease    a. s/p STEMI in 2014 with DES to LAD with staged PCI/DES placement to LCx   Echoencephalogram abnormality    Heart murmur    History of Holter monitoring    Hyperlipidemia    Hypertension    Myocardial infarction ([place])    Neuromuscular disorder (HCC)    neuropathy   NSVT (nonsustained ventricular tachycardia) ([place])    Personal history of chemotherapy    ENDOMETRIAL CA   Personal history of radiation therapy    ENDOMETRIAL CA   Uterine cancer ([place])    serrous    Surgical History: Past Surgical History:  Procedure Laterality Date   ABDOMINAL HYSTERECTOMY     BREAST EXCISIONAL BIOPSY Right 1956   NEG   CARDIOVERSION N/A 07/27/2017   Procedure: CARDIOVERSION;  Surgeon: Skeet Latch, MD;  Location: [facility];  Service: Cardiovascular;  Laterality: N/A;   LEFT HEART CATHETERIZATION WITH CORONARY ANGIOGRAM  10/19/2012   Procedure: LEFT HEART CATHETERIZATION WITH CORONARY ANGIOGRAM;  Surgeon: Troy Sine, MD;  Location: [facility] CATH LAB;  Service: Cardiovascular;;    PERCUTANEOUS CORONARY STENT INTERVENTION (PCI-S)  10/19/2012   Procedure: PERCUTANEOUS CORONARY STENT INTERVENTION (PCI-S);  Surgeon: Troy Sine, MD;  Location: [facility] CATH LAB;  Service: Cardiovascular;;   PERCUTANEOUS CORONARY STENT INTERVENTION (PCI-S) N/A 10/23/2012   Procedure: PERCUTANEOUS CORONARY STENT INTERVENTION (PCI-S);  Surgeon: Troy Sine, MD;  Location[facility] C[facility] LAB;  Service: Cardiovascular;  Laterality: N/A;   PERIPHERAL VASCULAR CATHETERIZATION N/A 05/05/2015   Procedure: Glori Luis Cath Insertion;  Surgeon: Algernon Huxley, MD;  Location: [facility] CV LAB;  Service: Cardiovascular;  Laterality: N/A;   PORTA CATH REMOVAL N/A 04/05/2017   Procedure: PORTA CATH REMOVAL;  Surgeon: Algernon Huxley, MD;  Location: [place] [facility] CV LAB;  Service: Cardiovascular;  Laterality: N/A;    Home Medications:  Allergies as of 01/03/2021       Reactions   Ace Inhibitors Cough   Penicillins Rash, Other (See Comments)   Has patient had a PCN reaction causing immediate rash, facial/tongue/throat swelling, SOB or lightheadedness with hypotension: No Has patient had a PCN reaction causing severe rash involving mucus membranes or skin necrosis: No Has patient had a PCN reaction that required hospitalization: No Has patient had a PCN reaction occurring within the last 10 years: No If all of the above answers are "NO", then may proceed with Cephalosporin use.        Medication List        Accurate as of January 03, 2021 12:54 PM. If you  have any questions, ask your nurse or doctor.          aspirin EC 81 MG tablet Take 81 mg by mouth daily.   atorvastatin 80 MG tablet Commonly known as: LIPITOR TAKE 1 TABLET (80 MG TOTAL) BY MOUTH DAILY AT 6 PM.   Eliquis 5 MG Tabs tablet Generic drug: apixaban TAKE 1 TABLET BY MOUTH TWICE A DAY   ezetimibe 10 MG tablet Commonly known as: ZETIA TAKE 1 TABLET BY MOUTH EVERY DAY   fluticasone 50 MCG/ACT nasal spray Commonly known as: FLONASE SPRAY 2  SPRAYS INTO EACH NOSTRIL EVERY DAY   isosorbide mononitrate 30 MG 24 hr tablet Commonly known as: IMDUR TAKE 1 TABLET BY MOUTH EVERY DAY   loperamide 2 MG tablet Commonly known as: IMODIUM A-D Take 2 mg by mouth 4 (four) times daily as needed for diarrhea or loose stools.   losartan 25 MG tablet Commonly known as: COZAAR TAKE 2 TABLETS BY MOUTH EVERY DAY   multivitamin with minerals Tabs tablet Take 1 tablet by mouth daily.   Nitrostat 0.4 MG SL tablet Generic drug: nitroGLYCERIN PLACE 1 TABLET UNDER THE TOUGUE EVERY 5 MINUTES FOR UP TO 3 DOSES AS NEEDED FOR CHEST PAIN   omeprazole 20 MG capsule Commonly known as: PRILOSEC Take 20 mg by mouth daily.   sulfamethoxazole-trimethoprim 800-160 MG tablet Commonly known as: BACTRIM DS Take 1 tablet by mouth 2 (two) times daily for 5 days. Started by: Debroah Loop, PA-C   vitamin B-12 1000 MCG tablet Commonly known as: CYANOCOBALAMIN Take 1,000 mcg by mouth daily.   vitamin C 1000 MG tablet Take 1,000 mg by mouth daily. Taking prior to PNS procedure.        Allergies:  Allergies  Allergen Reactions   Ace Inhibitors Cough   Penicillins Rash and Other (See Comments)    Has patient had a PCN reaction causing immediate rash, facial/tongue/throat swelling, SOB or lightheadedness with hypotension: No Has patient had a PCN reaction causing severe rash involving mucus membranes or skin necrosis: No Has patient had a PCN reaction that required hospitalization: No Has patient had a PCN reaction occurring within the last 10 years: No If all of the above answers are "NO", then may proceed with Cephalosporin use.     Family History: Family History  Problem Relation Age of Onset   Heart disease Mother    Breast cancer Mother 69   Heart disease Father    Diabetes Father    Diabetes Brother    Stroke Brother    Kidney cancer Neg Hx    Bladder Cancer Neg Hx     Social History:   reports that she quit smoking about  46 years ago. Her smoking use included cigarettes. She has never used smokeless tobacco. She reports current alcohol use of about 1.0 standard drink per week. She reports that she does not use drugs.  Physical Exam: There were no vitals taken for this visit.  Constitutional:  Alert and oriented, no acute distress, nontoxic appearing HEENT: [place], AT Cardiovascular: No clubbing, cyanosis, or edema Respiratory: Normal respiratory effort, no increased work of breathing Skin: No rashes, bruises or suspicious lesions Neurologic: Grossly intact, no focal deficits, moving all 4 extremities Psychiatric: Normal mood and affect  Laboratory Data: Results for orders placed or performed in visit on 01/03/21  Microscopic Examination   Urine  Result Value Ref Range   WBC, UA >30 (H) 0 - 5 /hpf   RBC 11-30 (A) 0 -  2 /hpf   Epithelial Cells (non renal) 0-10 0 - 10 /hpf   Bacteria, UA Few (A) None seen/Few  Urinalysis, Complete  Result Value Ref Range   Specific Gravity, UA 1.015 1.005 - 1.030   pH, UA 5.5 5.0 - 7.5   Color, UA Yellow Yellow   Appearance Ur Cloudy (A) Clear   Leukocytes,UA 3+ (A) Negative   Protein,UA 3+ (A) Negative/Trace   Glucose, UA Trace (A) Negative   Ketones, UA Negative Negative   RBC, UA 3+ (A) Negative   Bilirubin, UA Negative Negative   Urobilinogen, Ur 1.0 0.2 - 1.0 mg/dL   Nitrite, UA Positive (A) Negative   Microscopic Examination See below:    Assessment & Plan:   1. Mixed incontinence Patient received scheduled PTNS treatment today, see separate procedure note for details. - PTNS-Percutaneous Tibial Nerve Stimulati  2. Dysuria UA today notable for pyuria and microscopic hematuria consistent with acute cystitis, though suspect nitrites are a false positive in the setting of Azo use.  Will start empiric Bactrim and send for culture for further evaluation.  Patient is in agreement with this plan.  We will plan for repeat UA at her next PTNS treatment to prove  resolution of MH. - Urinalysis, Complete - CULTURE, URINE COMPREHENSIVE - sulfamethoxazole-trimethoprim (BACTRIM DS) 800-160 MG tablet; Take 1 tablet by mouth 2 (two) times daily for 5 days.  Dispense: 10 tablet; Refill: 0 - Urinalysis, Complete; Future   Return if symptoms worsen or fail to improve.  Debroah Loop, PA-C  [facility] [address], [place], [place] [address] [phone]

## 2021-01-03 NOTE — Progress Notes (Signed)
PTNS  Session # 7 (19 of 45)  Health & Social Factors: no change Caffeine: 2 Alcohol: 0 Daytime voids #per day: 5 Night-time voids #per night: 2 Urgency: mild Incontinence Episodes #per day: 1 Ankle used: right Treatment Setting: 19 Feeling/ Response: sensory Comments: patient tolerated well  Performed By: Elberta Leatherwood, CMA  Follow Up: 1 week #8 (20 of 45)

## 2021-01-03 NOTE — Patient Instructions (Signed)
Tracking Your Bladder Symptoms    Patient Name:___________________________________________________   Sample: Day   Daytime Voids  Nighttime Voids Urgency for the Day(0-4) Number of Accidents Beverage Comments  Monday IIII II 2 I Water IIII Coffee  I      Week Starting:____________________________________   Day Daytime  Voids Nighttime  Voids Urgency for  The Day(0-4) Number of Accidents Beverages Comments                                                           This week my symptoms were:  O much better  O better O the same O worse   

## 2021-01-05 LAB — CULTURE, URINE COMPREHENSIVE

## 2021-01-06 DIAGNOSIS — G4733 Obstructive sleep apnea (adult) (pediatric): Secondary | ICD-10-CM | POA: Diagnosis not present

## 2021-01-10 ENCOUNTER — Other Ambulatory Visit: Payer: Self-pay

## 2021-01-10 ENCOUNTER — Ambulatory Visit (INDEPENDENT_AMBULATORY_CARE_PROVIDER_SITE_OTHER): Payer: Medicare PPO | Admitting: Family Medicine

## 2021-01-10 DIAGNOSIS — R300 Dysuria: Secondary | ICD-10-CM

## 2021-01-10 DIAGNOSIS — N3946 Mixed incontinence: Secondary | ICD-10-CM | POA: Diagnosis not present

## 2021-01-10 LAB — URINALYSIS, COMPLETE
Bilirubin, UA: NEGATIVE
Glucose, UA: NEGATIVE
Ketones, UA: NEGATIVE
Leukocytes,UA: NEGATIVE
Nitrite, UA: NEGATIVE
Protein,UA: NEGATIVE
RBC, UA: NEGATIVE
Specific Gravity, UA: 1.015 (ref 1.005–1.030)
Urobilinogen, Ur: 0.2 mg/dL (ref 0.2–1.0)
pH, UA: 5.5 (ref 5.0–7.5)

## 2021-01-10 LAB — MICROSCOPIC EXAMINATION: RBC, Urine: NONE SEEN /hpf (ref 0–2)

## 2021-01-10 NOTE — Patient Instructions (Signed)
Tracking Your Bladder Symptoms    Patient Name:___________________________________________________   Sample: Day   Daytime Voids  Nighttime Voids Urgency for the Day(0-4) Number of Accidents Beverage Comments  Monday IIII II 2 I Water IIII Coffee  I      Week Starting:____________________________________   Day Daytime  Voids Nighttime  Voids Urgency for  The Day(0-4) Number of Accidents Beverages Comments                                                           This week my symptoms were:  O much better  O better O the same O worse   

## 2021-01-10 NOTE — Progress Notes (Signed)
PTNS  Session # 8 (20 of 45)  Health & Social Factors: no change Caffeine: 2 Alcohol: 0 Daytime voids #per day: 5 Night-time voids #per night: 3 Urgency: mild Incontinence Episodes #per day: 1 Ankle used: left Treatment Setting: 16 Feeling/ Response: sensory Comments: Patient tolerated well  Performed By: Elberta Leatherwood, CMA  Follow Up: 1 week #9 (21 of 45)

## 2021-01-11 NOTE — Progress Notes (Signed)
MH resolved on culture appropriate abx; patient notified in clinic.

## 2021-01-17 ENCOUNTER — Other Ambulatory Visit: Payer: Self-pay

## 2021-01-17 ENCOUNTER — Ambulatory Visit (INDEPENDENT_AMBULATORY_CARE_PROVIDER_SITE_OTHER): Payer: Medicare PPO | Admitting: *Deleted

## 2021-01-17 DIAGNOSIS — N3946 Mixed incontinence: Secondary | ICD-10-CM | POA: Diagnosis not present

## 2021-01-17 NOTE — Patient Instructions (Signed)
Tracking Your Bladder Symptoms    Patient Name:___________________________________________________   Sample: Day   Daytime Voids  Nighttime Voids Urgency for the Day(0-4) Number of Accidents Beverage Comments  Monday IIII II 2 I Water IIII Coffee  I      Week Starting:____________________________________   Day Daytime  Voids Nighttime  Voids Urgency for  The Day(0-4) Number of Accidents Beverages Comments                                                           This week my symptoms were:  O much better  O better O the same O worse   

## 2021-01-17 NOTE — Progress Notes (Signed)
Session # 9 (21 of 45)   Health & Social Factors: no change Caffeine: 2 Alcohol: 0 Daytime voids #per day: 5 Night-time voids #per night: 2 Urgency: mild Incontinence Episodes #per day: 1 Ankle used: right Treatment Setting: 8 Feeling/ Response: sensory Comments: Patient tolerated well   Performed By: Gaspar Cola , CMA   Follow Up: 1 week

## 2021-01-19 DIAGNOSIS — K219 Gastro-esophageal reflux disease without esophagitis: Secondary | ICD-10-CM | POA: Diagnosis not present

## 2021-01-19 DIAGNOSIS — E785 Hyperlipidemia, unspecified: Secondary | ICD-10-CM | POA: Diagnosis not present

## 2021-01-19 DIAGNOSIS — Z0000 Encounter for general adult medical examination without abnormal findings: Secondary | ICD-10-CM | POA: Diagnosis not present

## 2021-01-19 DIAGNOSIS — Z1389 Encounter for screening for other disorder: Secondary | ICD-10-CM | POA: Diagnosis not present

## 2021-01-19 DIAGNOSIS — M810 Age-related osteoporosis without current pathological fracture: Secondary | ICD-10-CM | POA: Diagnosis not present

## 2021-01-19 DIAGNOSIS — Z8542 Personal history of malignant neoplasm of other parts of uterus: Secondary | ICD-10-CM | POA: Diagnosis not present

## 2021-01-19 DIAGNOSIS — Z23 Encounter for immunization: Secondary | ICD-10-CM | POA: Diagnosis not present

## 2021-01-19 DIAGNOSIS — I10 Essential (primary) hypertension: Secondary | ICD-10-CM | POA: Diagnosis not present

## 2021-01-19 DIAGNOSIS — C541 Malignant neoplasm of endometrium: Secondary | ICD-10-CM | POA: Diagnosis not present

## 2021-01-24 ENCOUNTER — Other Ambulatory Visit: Payer: Self-pay

## 2021-01-24 ENCOUNTER — Ambulatory Visit (INDEPENDENT_AMBULATORY_CARE_PROVIDER_SITE_OTHER): Payer: Medicare PPO | Admitting: Physician Assistant

## 2021-01-24 DIAGNOSIS — N3946 Mixed incontinence: Secondary | ICD-10-CM | POA: Diagnosis not present

## 2021-01-24 NOTE — Progress Notes (Signed)
PTNS  Session #10  Health & Social Factors: Patient receiving acupuncture for neuropathy   Caffeine: 1 Alcohol: None Daytime voids #per day: 5 Night-time voids #per night: 2 Urgency: None Incontinence Episodes #per day: 0 Ankle used: Left  Treatment Setting: 14 Feeling/ Response: Sensory  Comments: Pt with bilateral neuropathy   Performed By: Gordy Clement, CMA   Follow Up: RTC in 1 week for PTNS

## 2021-01-24 NOTE — Patient Instructions (Signed)
Tracking Your Bladder Symptoms    Patient Name:___________________________________________________   Sample: Day   Daytime Voids  Nighttime Voids Urgency for the Day(0-4) Number of Accidents Beverage Comments  Monday IIII II 2 I Water IIII Coffee  I      Week Starting:____________________________________   Day Daytime  Voids Nighttime  Voids Urgency for  The Day(0-4) Number of Accidents Beverages Comments                                                           This week my symptoms were:  O much better  O better O the same O worse   

## 2021-01-31 ENCOUNTER — Other Ambulatory Visit: Payer: Self-pay

## 2021-01-31 ENCOUNTER — Ambulatory Visit (INDEPENDENT_AMBULATORY_CARE_PROVIDER_SITE_OTHER): Payer: Medicare PPO | Admitting: Family Medicine

## 2021-01-31 DIAGNOSIS — N3946 Mixed incontinence: Secondary | ICD-10-CM | POA: Diagnosis not present

## 2021-01-31 NOTE — Patient Instructions (Signed)
Tracking Your Bladder Symptoms    Patient Name:___________________________________________________   Sample: Day   Daytime Voids  Nighttime Voids Urgency for the Day(0-4) Number of Accidents Beverage Comments  Monday IIII II 2 I Water IIII Coffee  I      Week Starting:____________________________________   Day Daytime  Voids Nighttime  Voids Urgency for  The Day(0-4) Number of Accidents Beverages Comments                                                           This week my symptoms were:  O much better  O better O the same O worse   

## 2021-01-31 NOTE — Progress Notes (Signed)
PTNS  Session # 11 of 45  Health & Social Factors: no change Caffeine: 2 Alcohol: 0 Daytime voids #per day: 5 Night-time voids #per night: 1-2 Urgency: mild Incontinence Episodes #per day: 1-2 Ankle used: right Treatment Setting: 16 Feeling/ Response: sensory Comments: patient tolerated well  Performed By: Elberta Leatherwood, CMA  Follow Up: 1 week #12

## 2021-02-07 ENCOUNTER — Ambulatory Visit (INDEPENDENT_AMBULATORY_CARE_PROVIDER_SITE_OTHER): Payer: Medicare PPO

## 2021-02-07 ENCOUNTER — Other Ambulatory Visit: Payer: Self-pay

## 2021-02-07 DIAGNOSIS — N3946 Mixed incontinence: Secondary | ICD-10-CM | POA: Diagnosis not present

## 2021-02-07 NOTE — Patient Instructions (Signed)
Tracking Your Bladder Symptoms    Patient Name:___________________________________________________   Sample: Day   Daytime Voids  Nighttime Voids Urgency for the Day(0-4) Number of Accidents Beverage Comments  Monday IIII II 2 I Water IIII Coffee  I      Week Starting:____________________________________   Day Daytime  Voids Nighttime  Voids Urgency for  The Day(0-4) Number of Accidents Beverages Comments                                                           This week my symptoms were:  O much better  O better O the same O worse   

## 2021-02-07 NOTE — Progress Notes (Signed)
PTNS  Session # 12 of 45  Health & Social Factors: no change Caffeine: 2 Alcohol: 0 Daytime voids #per day: 5**patient states she does not "void" she leaks on the way to the bathroom. Occasionally in the morning she may urinate in the toilet. Patient states on her previous diaries the numbers for daytime and night time voids are leakage into pads   Night-time voids #per night: 1-2 Urgency: 0 Incontinence Episodes #per day: 0-1 **patient states her recordings of incontinence episodes are when she has to completely change her clothes due to leakage Ankle used: left Treatment Setting: 8 Feeling/ Response: sensory Comments: patient tolerated well  Performed By: Fonnie Jarvis, CMA  Follow Up: 1 month follow up with provider

## 2021-02-25 ENCOUNTER — Telehealth: Payer: Self-pay | Admitting: Cardiovascular Disease

## 2021-02-25 MED ORDER — HYDROCHLOROTHIAZIDE 25 MG PO TABS: ORAL_TABLET | ORAL | 6 refills | Status: DC

## 2021-02-25 NOTE — Telephone Encounter (Signed)
Spoke to patient she stated she has been having swelling in both lower legs for the past 2 months.Right leg worse.Stated she was told swelling caused by chemo and neuropathy.Stated acupuncture Dr. advised her to call Dr. Claiborne Billings and ask for a diuretic.Stated she has been wearing compression socks which are not helping.Spoke to [facility] he advised to take HCTZ 25 mg 1/2 tablet daily.Advised after 1 week if swelling not better increase to 25 mg 1 tablet daily.Advised to keep appointment with [facility] as planned.

## 2021-02-25 NOTE — Telephone Encounter (Signed)
Pt c/o swelling: STAT is pt has developed SOB within 24 hours  If swelling, where is the swelling located? Feet, ankles and legs  How much weight have you gained and in what time span? N/a  Have you gained 3 pounds in a day or 5 pounds in a week? no  Do you have a log of your daily weights (if so, list)? no  Are you currently taking a fluid pill? no  Are you currently SOB? no  Have you traveled recently? no

## 2021-03-07 ENCOUNTER — Other Ambulatory Visit: Payer: Self-pay

## 2021-03-07 ENCOUNTER — Encounter: Payer: Self-pay | Admitting: Urology

## 2021-03-07 ENCOUNTER — Ambulatory Visit: Payer: Medicare PPO | Admitting: Urology

## 2021-03-07 VITALS — BP 131/75 | HR 62 | Ht 62.0 in | Wt 207.0 lb

## 2021-03-07 DIAGNOSIS — N3946 Mixed incontinence: Secondary | ICD-10-CM | POA: Diagnosis not present

## 2021-03-07 NOTE — Progress Notes (Signed)
03/07/2021 11:50 AM   Susan [name] Aug 15, 1934 [id number]  Referring provider: [name], MD [phone],  [place] [address]  No chief complaint on file.   HPI: Reviewed history in detail.  Patient has had radiation for uterine cancer and chemotherapy.  We have talked about Botox with radiation.  We talked about InterStim and percutaneous tibial nerve stimulation..  She has had a lot of high-volume leakage especially triggered when she goes from a sitting to standing position.  She has been on Eliquis.  After the first bulking treatment she was dramatically better on Myrbetriq.  She was no longer having high-volume incontinence triggered by standing.  She was wearing 2 pads a day that were damp.  Blood thinner Eliquis and aspirin had been stopped prior.    Patient was dry for approximately 5 months after first bulking treatment.  She had a second bulking treatment April 22, 2020.   Today Frequency stable.  Still wears 5 pads a day.  She traveled to [place] and double pad for confidence.  She leaks primarily when she goes from a sitting to standing position.  She leaks more when she gets out of a pool.    Not convinced that treating again with a bulking treatment is in the patient's best interest.  She is going to start percutaneous tibial nerve stimulation.  If this fails she can consider Botox in the face of radiation and InterStim.  I think we need to have reasonable treatment goals.  Today Patient was being treated with percutaneous tibial nerve stimulation.  In October 2022 she was treated for a bladder infection by nurse practitioner.  Culture was positive  No longer being treated with percutaneous tibial nerve stimulation.  No medications.  Clinically not infected.  Still has severe incontinence.  Has tremendous pitting leg edema and is now on diuretic with dose adjustment    PMH: Past Medical History:  Diagnosis Date   Bradycardia    Chronic back pain     Coronary artery disease    a. s/p STEMI in 2014 with DES to LAD with staged PCI/DES placement to LCx   Echoencephalogram abnormality    Heart murmur    History of Holter monitoring    Hyperlipidemia    Hypertension    Myocardial infarction ([place])    Neuromuscular disorder (HCC)    neuropathy   NSVT (nonsustained ventricular tachycardia)    Personal history of chemotherapy    ENDOMETRIAL CA   Personal history of radiation therapy    ENDOMETRIAL CA   Uterine cancer ([place])    serrous    Surgical History: Past Surgical History:  Procedure Laterality Date   ABDOMINAL HYSTERECTOMY     BREAST EXCISIONAL BIOPSY Right 1956   NEG   CARDIOVERSION N/A 07/27/2017   Procedure: CARDIOVERSION;  Surgeon: Skeet Latch, MD;  Location: [facility];  Service: Cardiovascular;  Laterality: N/A;   LEFT HEART CATHETERIZATION WITH CORONARY ANGIOGRAM  10/19/2012   Procedure: LEFT HEART CATHETERIZATION WITH CORONARY ANGIOGRAM;  Surgeon: Troy Sine, MD;  Location: [facility] CATH LAB;  Service: Cardiovascular;;   PERCUTANEOUS CORONARY STENT INTERVENTION (PCI-S)  10/19/2012   Procedure: PERCUTANEOUS CORONARY STENT INTERVENTION (PCI-S);  Surgeon: Troy Sine, MD;  Location: [facility] CATH LAB;  Service: Cardiovascular;;   PERCUTANEOUS CORONARY STENT INTERVENTION (PCI-S) N/A 10/23/2012   Procedure: PERCUTANEOUS CORONARY STENT INTERVENTION (PCI-S);  Surgeon: Troy Sine, MD;  Location: [facility] CATH LAB;  Service: Cardiovascular;  Laterality: N/A;   PERIPHERAL  VASCULAR CATHETERIZATION N/A 05/05/2015   Procedure: Glori Luis Cath Insertion;  Surgeon: Algernon Huxley, MD;  Location: [place] CV LAB;  Service: Cardiovascular;  Laterality: N/A;   PORTA CATH REMOVAL N/A 04/05/2017   Procedure: PORTA CATH REMOVAL;  Surgeon: Algernon Huxley, MD;  Location: [place] CV LAB;  Service: Cardiovascular;  Laterality: N/A;    Home Medications:  Allergies as of 03/07/2021       Reactions   Ace Inhibitors Cough   Penicillins Rash,  Other (See Comments)   Has patient had a PCN reaction causing immediate rash, facial/tongue/throat swelling, SOB or lightheadedness with hypotension: No Has patient had a PCN reaction causing severe rash involving mucus membranes or skin necrosis: No Has patient had a PCN reaction that required hospitalization: No Has patient had a PCN reaction occurring within the last 10 years: No If all of the above answers are "NO", then may proceed with Cephalosporin use.        Medication List        Accurate as of March 07, 2021 11:50 AM. If you have any questions, ask your nurse or doctor.          alendronate 70 MG tablet Commonly known as: FOSAMAX Take by mouth.   aspirin EC 81 MG tablet Take 81 mg by mouth daily.   atorvastatin 80 MG tablet Commonly known as: LIPITOR TAKE 1 TABLET (80 MG TOTAL) BY MOUTH DAILY AT 6 PM.   Eliquis 5 MG Tabs tablet Generic drug: apixaban TAKE 1 TABLET BY MOUTH TWICE A DAY   ezetimibe 10 MG tablet Commonly known as: ZETIA TAKE 1 TABLET BY MOUTH EVERY DAY   fluticasone 50 MCG/ACT nasal spray Commonly known as: FLONASE SPRAY 2 SPRAYS INTO EACH NOSTRIL EVERY DAY   hydrochlorothiazide 25 MG tablet Commonly known as: HYDRODIURIL Take 1/2 tablet daily for 1 week if continues to have swelling increase to 1 tablet 25 mg daily   isosorbide mononitrate 30 MG 24 hr tablet Commonly known as: IMDUR TAKE 1 TABLET BY MOUTH EVERY DAY   loperamide 2 MG tablet Commonly known as: IMODIUM A-D Take 2 mg by mouth 4 (four) times daily as needed for diarrhea or loose stools.   losartan 25 MG tablet Commonly known as: COZAAR TAKE 2 TABLETS BY MOUTH EVERY DAY   multivitamin with minerals Tabs tablet Take 1 tablet by mouth daily.   Nitrostat 0.4 MG SL tablet Generic drug: nitroGLYCERIN PLACE 1 TABLET UNDER THE TOUGUE EVERY 5 MINUTES FOR UP TO 3 DOSES AS NEEDED FOR CHEST PAIN   omeprazole 20 MG capsule Commonly known as: PRILOSEC Take 20 mg by mouth  daily.   vitamin B-12 1000 MCG tablet Commonly known as: CYANOCOBALAMIN Take 1,000 mcg by mouth daily.   vitamin C 1000 MG tablet Take 1,000 mg by mouth daily. Taking prior to PNS procedure.        Allergies:  Allergies  Allergen Reactions   Ace Inhibitors Cough   Penicillins Rash and Other (See Comments)    Has patient had a PCN reaction causing immediate rash, facial/tongue/throat swelling, SOB or lightheadedness with hypotension: No Has patient had a PCN reaction causing severe rash involving mucus membranes or skin necrosis: No Has patient had a PCN reaction that required hospitalization: No Has patient had a PCN reaction occurring within the last 10 years: No If all of the above answers are "NO", then may proceed with Cephalosporin use.     Family History: Family History  Problem  Relation Age of Onset   Heart disease Mother    Breast cancer Mother 39   Heart disease Father    Diabetes Father    Diabetes Brother    Stroke Brother    Kidney cancer Neg Hx    Bladder Cancer Neg Hx     Social History:  reports that she quit smoking about 46 years ago. Her smoking use included cigarettes. She has never used smokeless tobacco. She reports current alcohol use of about 1.0 standard drink per week. She reports that she does not use drugs.  ROS:                                        Physical Exam: BP 131/75   Pulse 62   Ht 5\' 2"  (1.575 m)   Wt 93.9 kg   BMI 37.86 kg/m   Constitutional:  Alert and oriented, No acute distress.   Laboratory Data: Lab Results  Component Value Date   WBC 6.1 12/15/2019   HGB 13.8 12/15/2019   HCT 40.6 12/15/2019   MCV 90.4 12/15/2019   PLT 222 12/15/2019    Lab Results  Component Value Date   CREATININE 0.87 12/15/2019    No results found for: PSA  No results found for: TESTOSTERONE  Lab Results  Component Value Date   HGBA1C 5.7 (H) 10/19/2012    Urinalysis    Component Value Date/Time    COLORURINE YELLOW (A) 02/20/2018 0905   APPEARANCEUR Clear 01/10/2021 1017   LABSPEC 1.013 02/20/2018 0905   PHURINE 5.0 02/20/2018 0905   GLUCOSEU Negative 01/10/2021 1017   HGBUR MODERATE (A) 02/20/2018 0905   BILIRUBINUR Negative 01/10/2021 [place] 02/20/2018 0905   PROTEINUR Negative 01/10/2021 1017   PROTEINUR 30 (A) 02/20/2018 0905   NITRITE Negative 01/10/2021 1017   NITRITE NEGATIVE 02/20/2018 0905   LEUKOCYTESUR Negative 01/10/2021 1017    Pertinent Imaging:   Assessment & Plan: Clinically not infected.  I went over Botox with full template and we addressed previous radiation relative.  She should not have sacral nerve stimulation.  She chose watchful waiting I was not surprised.  I will see her as needed  There are no diagnoses linked to this encounter.  No follow-ups on file.  Reece Packer, MD  [place] [address], [place], [place] [address] [phone]

## 2021-03-10 DIAGNOSIS — H2513 Age-related nuclear cataract, bilateral: Secondary | ICD-10-CM | POA: Diagnosis not present

## 2021-03-10 DIAGNOSIS — Z0100 Encounter for examination of eyes and vision without abnormal findings: Secondary | ICD-10-CM | POA: Diagnosis not present

## 2021-04-01 ENCOUNTER — Ambulatory Visit: Payer: Medicare PPO | Admitting: Cardiovascular Disease

## 2021-04-01 ENCOUNTER — Encounter: Payer: Self-pay | Admitting: Cardiovascular Disease

## 2021-04-01 ENCOUNTER — Other Ambulatory Visit: Payer: Self-pay

## 2021-04-01 DIAGNOSIS — I252 Old myocardial infarction: Secondary | ICD-10-CM

## 2021-04-01 DIAGNOSIS — I4821 Permanent atrial fibrillation: Secondary | ICD-10-CM

## 2021-04-01 DIAGNOSIS — R600 Localized edema: Secondary | ICD-10-CM

## 2021-04-01 DIAGNOSIS — G4733 Obstructive sleep apnea (adult) (pediatric): Secondary | ICD-10-CM | POA: Diagnosis not present

## 2021-04-01 DIAGNOSIS — Z7901 Long term (current) use of anticoagulants: Secondary | ICD-10-CM

## 2021-04-01 DIAGNOSIS — E785 Hyperlipidemia, unspecified: Secondary | ICD-10-CM | POA: Diagnosis not present

## 2021-04-01 DIAGNOSIS — I10 Essential (primary) hypertension: Secondary | ICD-10-CM

## 2021-04-01 DIAGNOSIS — I447 Left bundle-branch block, unspecified: Secondary | ICD-10-CM

## 2021-04-01 MED ORDER — FUROSEMIDE 40 MG PO TABS
40.0000 mg | ORAL_TABLET | Freq: Every day | ORAL | 3 refills | Status: DC
Start: 2021-04-01 — End: 2021-06-29

## 2021-04-01 NOTE — Progress Notes (Signed)
Patient ID: Susan [name], female   DOB: 21-Feb-1935, 87 y.o.   MRN: [id number]      HPI: Susan [name] is a 87 y.o. female who presents to the office today for a 10 month follow-up cardiology evaluation.  Ms. Lacomb is originally from [place], [place] and now resides in [place], [place]. On 10/19/2012 she presented [facility] in transfer from [facility] with an anterior wall ST segment elevation myocardial infarction. She had chest pain of greater than 12 hours duration. I performed emergent cardiac catheterization  where her LAD was found to be totally occluded.  She underwent stenting of her LAD extending from the ostium to mid region with 3.0x38 mm and 3.0x16 mm Promus Premier DES stents. Initially she had significant wall motion abnormality involving the LAD territory. She also was found to have a 95% mid AV groove circumflex stenosis.  On 10/23/2012 she underwent successful staged intervention with insertion of a 2.25x12 mm Promus premier DES stent into the circumflex vessel. Subsequently she had done well. She did develop some hypotension leading to reduction in some of her medications.  She was enrolled in the [place] cardiac rehabilitation program. An exercise Myoview study on 12/03/2012 demonstrated significant salvage of myocardium with only a small area of distal antero-apical, apical, septal and  apical scar without evidence for ischemia. Post-rest ejection fraction was 66% and there was evidence for apical akinesis. Laboratory revealed an increased LDL particle number at 1352 despite being on atorvastatin 80 mg. She was contacted and Zetia 10 mg was just added to this atorvastatin dose. She did have 735 small LDL particles and a calculated LDL of 90 with HDL 40 and triglycerides 119.  She had developed hematuria and was found to have left kidney stones.  She underwent lithotripsy for her multiple kidney stones. A nuclear perfusion study prior to potential  discontinuance of dual anti-platelet therapy on 11/05/2013  was interpreted as low risk, but did show extensive scar in the distal LAD to return distribution.  Post-rest ejection fraction was 40%.  There was evidence for apical akinesis/mild dyskinesis.  She has been documented to have episodes of nonsustained ventricular tachycardia and has seen [name]..  Her bradycardia has been more profound while sleeping.  She was able to exercise and walk without limitation other than back discomfort.  She was evaluated for possible indication for pacemaker was not felt that a pacemaker is indicated presently.  Because of her bradycardia she has not been on beta blocker therapy.  She has had episodes of nonsustained VT noted on a CardioNet monitor.  When I  saw her she admitted that her sleep was poor and nonrestorative.  She snored loudly and required frequent daytime naps.  He was sleeping at least 8 hours per night, but poor quality.  She underwent a split-night protocol sleep study on 03/08/2015 and was found to have severe sleep apnea with an AHI of 35.8 overall and more severe with REM sleep with an AHI of 67.5 per hour.  She had significant oxygen desaturation to a nadir of 77%.  There was loud snoring.  I recommended tai chi CPAP therapy at 9 cm water pressure.  Since initiating CPAP therapy.  She has felt well.  She is no longer having to take naps.  She is using choice for DME company.  When I saw her one year ago, she was meeting Medicare compliance. A download was obtained from 05/01/2015 through 05/30/2015 with usage stays at 93% and  70% of days greater than 4 hours.  Her AHI was excellent at 1.9 with a set pressure of 9 cm.    She was diagnosed with endometrial cancer and underwent a total hysterectomy.  She also has a Port-A-Cath that had undergone chemotherapy and also completed 25 radiation remains to her body and 3 intravaginally..  She has developed a neuropathy for which he takes gabapentin.  He  is unaware of chest pain or palpitations.    When I saw her in 2018 she continued to use CPAP with 100% compliance.  A download from 06/12/2016 through 06/13/2016 100% of usage stays.  She has a ResMed AirSense10 AutoSet unit set at a pressure of 9 cm.  Her AHI is 1.1.  She is using a Risk manager &Paykel Eson nasal mask.  There is no leak.    Since I  saw her in March 2018, she has done well from a cardiac standpoint.  She denies chest pain, PND, orthopnea, or palpitations.  She continues to teach water aerobics.  However, she has continued to have issues as result of her radiation and chemotherapy for her endometrial CA.  She has developed a neuropathy and as result, has been walking with a cane or a walker.  Her gabapentin dose has been increased to 3 times per day.  She also has a small fracture in her sacrum.  She underwent initial evaluation at [facility] for possible kyphoplasty, but ultimately this was not done.  She also has degenerative disc disease in L3-L5 for which she has been seen pain management.  She has undergone injection.  She we having an additional injection next week and will need to hold Brilinta.  She also has had radiation-induced urinary incontinence.  A recent CT has demonstrated 5 small nodules in her long and she will be undergoing a follow-up CT in December.  When I saw her in October 2018 a CPAP download  from 11/27/2016 -12/26/2016 confirmed 100% compliance . AHI was1.2 at her 9 cm water pressure.  She was averaging 7 hours and 57 minutes of sleep per night.  An Epworth Sleepiness Scale score was 1.  She subsequently was found to have significant bradycardia on event monitor.  She had heart rates down to the mid to low 30s.  There were 2 episodes where her heart rate dropped to 25 bpm and then 19 bpm.  She was not on any AV nodal blocking drugs.  I recommended that she go to the emergency room for evaluation.  She was admitted overnight.  She was evaluated by [name] the following  day.  He reviewed her Holter monitor.  At that time, no AF was appreciated but there were periods of Mobitz 1 block, rates in the 20s with pauses and junctional escape beats.  Her telemetry on that particular day was showing sinus bradycardia in the 40s-50s.  The patient admitted that she was entirely asymptomatic.  At that point the decision was made not to place a permanent pacemaker.  She subsequently saw [facility] in office follow-up in January 2019.  When I saw her in March 2019 she was much more tired and sleepy.  She was not using CPAP since she was coughing.  She also was using Mucinex DM for her chest congestion.  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  She has issues with back discomfort.  During that evaluation, her ECG showed that she was in atrial fibrillation at 67  bpm with left bundle branch block with a rates variable ranging from low 40s to approximately 88.  An echo Doppler study on June 27, 2017 showed an EF of 40 to 45%.  There was mild aortic stenosis with a valve area estimated 1.82 cm.  She had moderate left atrial dilatation.  There was mild pulmonary hypertension with a PA peak pressure 36 mm.    She saw [name] for cardiology/EP follow-up on July 17, 2017.  At that time she was not having any episodes of significant dizziness, presyncope or syncope.  They discussed an attempt at trying to get her back into sinus rhythm and she has been on Eliquis.  She underwent an outpatient successful cardioversion on Jul 27, 2017 for restoration of sinus rhythm at 1 shock at 150 J.   I  saw her in June 2019 at which time she had issues with urinary incontinence as result of damage from radiation treatment.  She admits to 1-2+ ankle swelling.  She is unaware of her cardiac rhythm.  She is now back on CPAP with 100% compliance and is no longer coughing.    She has undergone recent evaluation with [name] on April 17, 2019.  Her ECG showed atrial fibrillation with left bundle branch block.  She denies episodes of palpitations, chest pain shortness of breath orthopnea PND, dizziness presyncope or syncope.  Unfortunately, her husband fell in January and injured his hip.  Following a short hospitalization he was advised that he goes to a rehab center for physical therapy.  Unfortunately he contracted Covid during that time, subsequently was transferred to [facility] and unfortunately died on 05/05/2019 after being on a ventilator with COVID-19 infection.  I saw her in March 2021.  At that time she was having significant difficulty with the recent death of her husband from [place].  She denied any chest pain PND orthopnea or palpitations.  She is now living by herself.  She continued to use CPAP therapy.  Her insurance has changed and she will need to change her provider which currently is [facility] to [place] in [place].  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   I last saw her on June 08, 2019 at which time he was having issues with neuropathy and  bladder problems from her radiation treatment.  She also recently underwent Botox administration to her bladder.  She denies any recurrent anginal symptoms or shortness of breath.  She is unaware of palpitations.  She has continued to be on isosorbide 30 mg and losartan 50 mg daily.  She is on Eliquis for anticoagulation.  She continues to be on atorvastatin 80 mg and Zetia 10 mg for hyperlipidemia.  She is followed by Dr. Lovie Macadamia at the [facility] in [place] for primary care.  She continues to use CPAP with 100% compliance.  I obtained a download from February 12 through June 06, 2020 which shows 100% usage with average use at 6 hours and 48 minutes.  At 9 cm set pressure, AHI is excellent at 1.2.   Since her last evaluation with me, she has been wearing a left foot brace due to a dropfoot.   She has noticed leg swelling right greater than left.  She has continued to use her CPAP machine, but her unit is no longer wireless with her initial set up date in December 2016.  She brought her machine in today  so that we could access data with a card.  She continues to be compliant on a download from December 8 through April 01, 2021 with average use at 6 hours and 26 minutes.  At 9 cm set pressure AHI is 1.3.  She continues to have issues with her neuropathy.  She denies chest pain or palpitations.  She presents for reevaluation.  Past Medical History:  Diagnosis Date   Bradycardia    Chronic back pain    Coronary artery disease    a. s/p STEMI in 2014 with DES to LAD with staged PCI/DES placement to LCx   Echoencephalogram abnormality    Heart murmur    History of Holter monitoring    Hyperlipidemia    Hypertension    Myocardial infarction ([place])    Neuromuscular disorder (HCC)    neuropathy   NSVT (nonsustained ventricular tachycardia)    Personal history of chemotherapy    ENDOMETRIAL CA   Personal history of radiation therapy    ENDOMETRIAL CA   Uterine cancer ([place])    serrous    Past Surgical History:  Procedure Laterality Date   ABDOMINAL HYSTERECTOMY     BREAST EXCISIONAL BIOPSY Right 1956   NEG   CARDIOVERSION N/A 07/27/2017   Procedure: CARDIOVERSION;  Surgeon: Skeet Latch, MD;  Location: [facility];  Service: Cardiovascular;  Laterality: N/A;   LEFT HEART CATHETERIZATION WITH CORONARY ANGIOGRAM  10/19/2012   Procedure: LEFT HEART CATHETERIZATION WITH CORONARY ANGIOGRAM;  Surgeon: Troy Sine, MD;  Location: [facility] CATH LAB;  Service: Cardiovascular;;   PERCUTANEOUS CORONARY STENT INTERVENTION (PCI-S)  10/19/2012   Procedure: PERCUTANEOUS CORONARY STENT INTERVENTION (PCI-S);  Surgeon: Troy Sine, MD;  Location: [facility] CATH LAB;  Service: Cardiovascular;;   PERCUTANEOUS CORONARY STENT INTERVENTION (PCI-S) N/A 10/23/2012   Procedure: PERCUTANEOUS CORONARY STENT  INTERVENTION (PCI-S);  Surgeon: Troy Sine, MD;  Location: [facility] C[facility] LAB;  Service: Cardiovascular;  Laterality: N/A;   PERIPHERAL VASCULAR CATHETERIZATION N/A 05/05/2015   Procedure: Glori Luis Cath Insertion;  Surgeon: Algernon Huxley, MD;  Location: [facility] CV LAB;  Service: Cardiovascular;  Laterality: N/A;   PORTA CATH REMOVAL N/A 04/05/2017   Procedure: PORTA CATH REMOVAL;  Surgeon: Algernon Huxley, MD;  Location: [facility] CV LAB;  Service: Cardiovascular;  Laterality: N/A;    Allergies  Allergen Reactions   Ace Inhibitors Cough   Penicillins Rash and Other (See Comments)    Has patient had a PCN reaction causing immediate rash, facial/tongue/throat swelling, SOB or lightheadedness with hypotension: No Has patient had a PCN reaction causing severe rash involving mucus membranes or skin necrosis: No Has patient had a PCN reaction that required hospitalization: No Has patient had a PCN reaction occurring within the last 10 years: No If all of the above answers are "NO", then may proceed with Cephalosporin use.     Current Outpatient Medications  Medication Sig Dispense Refill   alendronate (FOSAMAX) 70 MG tablet Take by mouth.     Ascorbic Acid (VITAMIN C) 1000 MG tablet Take 1,000 mg by mouth daily. Taking prior to PNS procedure.     aspirin EC 81 MG tablet Take 81 mg by mouth daily.     atorvastatin (LIPITOR) 80 MG tablet TAKE 1 TABLET (80 MG TOTAL) BY MOUTH DAILY AT 6 PM. 90 tablet 3   ELIQUIS 5 MG TABS tablet TAKE 1 TABLET BY MOUTH TWICE A DAY 180 tablet 1   ezetimibe (ZETIA) 10 MG tablet TAKE 1 TABLET BY MOUTH  EVERY DAY 90 tablet 3   fluticasone (FLONASE) 50 MCG/ACT nasal spray SPRAY 2 SPRAYS INTO EACH NOSTRIL EVERY DAY     furosemide (LASIX) 40 MG tablet Take 1 tablet (40 mg total) by mouth daily. Take $RemoveBef'40mg'HnqfDnSCQa$  for 2 days then take $RemoveB'20mg'UhzgUTaa$  (1/2) daily, may take extra $RemoveBe'40mg'yyHitCaRJ$  for increased swelling 45 tablet 3   isosorbide mononitrate (IMDUR) 30 MG 24 hr tablet TAKE 1 TABLET BY MOUTH EVERY  DAY 90 tablet 3   loperamide (IMODIUM A-D) 2 MG tablet Take 2 mg by mouth 4 (four) times daily as needed for diarrhea or loose stools.      losartan (COZAAR) 25 MG tablet TAKE 2 TABLETS BY MOUTH EVERY DAY 180 tablet 3   Multiple Vitamin (MULTIVITAMIN WITH MINERALS) TABS Take 1 tablet by mouth daily.     NITROSTAT 0.4 MG SL tablet PLACE 1 TABLET UNDER THE TOUGUE EVERY 5 MINUTES FOR UP TO 3 DOSES AS NEEDED FOR CHEST PAIN 25 tablet 0   omeprazole (PRILOSEC) 20 MG capsule Take 20 mg by mouth daily.     vitamin B-12 (CYANOCOBALAMIN) 1000 MCG tablet Take 1,000 mcg by mouth daily.     No current facility-administered medications for this visit.   Facility-Administered Medications Ordered in Other Visits  Medication Dose Route Frequency Provider Last Rate Last Admin   sodium chloride flush (NS) 0.9 % injection 10 mL  10 mL Intravenous PRN Charlaine Dalton R, MD   10 mL at 06/06/16 1447   sodium chloride flush (NS) 0.9 % injection 10 mL  10 mL Intravenous PRN Cammie Sickle, MD   10 mL at 08/15/16 1404    Social History   Socioeconomic History   Marital status: Widowed    Spouse name: Not on file   Number of children: Not on file   Years of education: Not on file   Highest education level: Not on file  Occupational History   Not on file  Tobacco Use   Smoking status: Former    Types: Cigarettes    Quit date: 73    Years since quitting: 47.0   Smokeless tobacco: Never  Vaping Use   Vaping Use: Never used  Substance and Sexual Activity   Alcohol use: Yes    Alcohol/week: 1.0 standard drink    Types: 1 Glasses of wine per week   Drug use: No   Sexual activity: Not Currently  Other Topics Concern   Not on file  Social History Narrative   Not on file   Social Determinants of Health   Financial Resource Strain: Not on file  Food Insecurity: Not on file  Transportation Needs: Not on file  Physical Activity: Not on file  Stress: Not on file  Social Connections: Not on  file  Intimate Partner Violence: Not on file    Socially she is married. There is no tobacco use. She drinks alcohol rarely.  Family History  Problem Relation Age of Onset   Heart disease Mother    Breast cancer Mother 52   Heart disease Father    Diabetes Father    Diabetes Brother    Stroke Brother    Kidney cancer Neg Hx    Bladder Cancer Neg Hx     ROS General: Negative; No fevers, chills, or night sweats;  HEENT: Negative; No changes in vision or hearing, sinus congestion, difficulty swallowing Pulmonary: Negative; No cough, wheezing, shortness of breath, hemoptysis Cardiovascular:  See HPI GI: Positive for GERD; No nausea, vomiting, diarrhea,  or abdominal pain PP:[id number] of previous kidney stones, contributing to left flank pain intermittently; No dysuria, hematuria, or difficulty voiding; radiation-induced urinary incontinence Musculoskeletal: Positive for small sacral fracture, L3-L5 degenerative disc disease. Hematologic/Oncology: Positive for endometrial cancer, status post chemotherapy and radiation treatments. Endocrine: Negative; no heat/cold intolerance; no diabetes Neuro: Neuropathy, left foot drop Skin: Negative; No rashes or skin lesions Psychiatric: Negative; No behavioral problems, depression Sleep: Positive for obstructive sleep apnea, on CPAP therapy.  No breakthrough snoring.  No daytime sleepiness. Other comprehensive 14 point system review is negative.   PE BP 108/62    Pulse (!) 56    Ht $R'5\' 2"'FU$  (1.575 m)    Wt 210 lb 9.6 oz (95.5 kg)    SpO2 98%    BMI 38.52 kg/m    Repeat blood pressure by me was 122/70  Wt Readings from Last 3 Encounters:  04/01/21 210 lb 9.6 oz (95.5 kg)  03/07/21 207 lb (93.9 kg)  09/22/20 207 lb 8 oz (94.1 kg)   General: Alert, oriented, no distress.  Skin: normal turgor, no rashes, warm and dry HEENT: Normocephalic, atraumatic. Pupils equal round and reactive to light; sclera anicteric; extraocular muscles intact;  Nose  without nasal septal hypertrophy Mouth/Parynx benign; Mallinpatti scale 3 Neck: No JVD, no carotid bruits; normal carotid upstroke Lungs: clear to ausculatation and percussion; no wheezing or rales Chest wall: without tenderness to palpitation Heart: PMI not displaced, RRR, s1 s2 normal, 1/6 systolic murmur, no diastolic murmur, no rubs, gallops, thrills, or heaves Abdomen: soft, nontender; no hepatosplenomehaly, BS+; abdominal aorta nontender and not dilated by palpation. Back: no CVA tenderness Pulses 2+ Musculoskeletal: full range of motion, normal strength, no joint deformities Extremities: Bilateral leg swelling right greater than left;no clubbing cyanosis or edema, Homan's sign negative  Neurologic: grossly nonfocal; Cranial nerves grossly wnl Psychologic: Normal mood and affect   April 01, 2021 ECG (independently read by me):  Atrial fibrillation at 56, LBBB  March 2021 ECG (independently read by me): Atrial fibrillation at 70; LBBB, QTc 453  June 2019 ECG (independently read by me): Atrial fibrillation with ventricular rate at 53 bpm.  Left bundle branch block with repolarization changes.  QTc interval 4 1 ms.  June 15, 2017 ECG (independently read by me): Atrial fibrillation at 67 bpm.  Left bundle branch block. Variable rate ranging from probably in the , low 40s to approximately 88  October 2018 ECG (independently read by me): Marked sinus bradycardia with PACs.  Ventricular rate at 43.  Left bundle branch block with repolarization changes.  March 2018 ECG (independently read by me): Sinus bradycardia 57 bpm.  First-degree AV block with a PR interval 314 ms. .She now has a left bundle-branch block which is new since her prior tracing.  March 2017 ECG (independently read by me): Sinus bradycardia 52 bpm with first-degree block.  PR interval 284 ms.  LVH by voltage.  QS V1 through V3 concordant with old anteroseptal MI.  March 2016 ECG (independently read by me): Normal sinus  rhythm.  Old anteroseptal MI.  First-degree AV block.  October 27, 2013 ECG (independently read by me): Normal sinus rhythm at 60 beats per minute.  First degree AV block with PR interval at 276 ms.  Old anteroseptal MI with QS complex in V1 and V2.  EKG (independently read by me): Sinus rhythm at 55 beats per minute. First degree AV block. Poor progression compatible with her prior septal infarction. T-wave changes anterolaterally.  LABS: BMP Latest Ref  Rng & Units 12/15/2019 02/26/2019 12/16/2018  Glucose 70 - 99 mg/dL 116(H) 125(H) 137(H)  BUN 8 - 23 mg/dL _0 Creatinine 0.44 - 1.00 mg/dL 0.87 1.06(H) 0.96  Sodium 135 - 145 mmol/L 141 137 140  Potassium 3.5 - 5.1 mmol/L 3.8 3.7 3.8  Chloride 98 - 111 mmol/L 106 103 105  CO2 22 - 32 mmol/L _1 Calcium 8.9 - 10.3 mg/dL 9.4 9.5 9.5   Hepatic Function Latest Ref Rng & Units 12/15/2019 02/26/2019 12/16/2018  Total Protein 6.5 - 8.1 g/dL 7.1 6.9 7.0  Albumin 3.5 - 5.0 g/dL 4.0 4.0 4.0  AST 15 - 41 U/L _2 ALT 0 - 44 U/L _3 Alk Phosphatase 38 - 126 U/L 92 92 91  Total Bilirubin 0.3 - 1.2 mg/dL 0.8 0.9 0.7  Bilirubin, Direct 0.00 - 0.40 mg/dL - - -   CBC Latest Ref Rng & Units 12/15/2019 02/26/2019 12/16/2018  WBC 4.0 - 10.5 K/uL 6.1 5.6 6.0  Hemoglobin 12.0 - 15.0 g/dL 13.8 12.6 12.8  Hematocrit 36.0 - 46.0 % 40.6 38.7 38.1  Platelets 150 - 400 K/uL 222 213 198   Lab Results  Component Value Date   MCV 90.4 12/15/2019   MCV 93.7 02/26/2019   MCV 92.5 12/16/2018   Lab Results  Component Value Date   TSH 0.97 06/19/2016   Lab Results  Component Value Date   HGBA1C 5.7 (H) 10/19/2012   Lipid Panel     Component Value Date/Time   CHOL 116 06/06/2017 0922   CHOL 141 04/16/2013 0924   TRIG 75 06/06/2017 0922   TRIG 112 04/16/2013 0924   HDL 51 06/06/2017 0922   HDL 53 04/16/2013 0924   CHOLHDL 2.3 06/06/2017 0922   CHOLHDL 2.2 06/19/2016 0950   VLDL 20 06/19/2016 0950   LDLCALC 50 06/06/2017 0922    LDLCALC 66 04/16/2013 0924   RADIOLOGY: No results found.  IMPRESSION:  1. Essential hypertension   2. Old MI (myocardial infarction): Anterior STEMI 10/19/2012   3. Bilateral leg edema   4. Permanent atrial fibrillation ([place])   5. Anticoagulation adequate   6. OSA (obstructive sleep apnea)   7. Left bundle branch block   8. Hyperlipidemia with target LDL less than 70     ASSESSMENT AND PLAN: Ms. Carole Binning is an 87 year-old female who presented to [facility] on 10/19/2012 with a STEMI after an approximate 12 hour delay with back and chest discomfort. When she arrived to [facility] she had excellent door to balloon time at 27 minutes in the setting of an anterior wall ST segment elevation myocardial infarction. She underwent successful stenting of her LAD and because of diffuse disease tandem DES stents were inserted.  She underwent staged intervention to the circumflex  4 days later. She had concomitant 50 - 60% RCA narrowing and a large dominant right coronary artery.  She has a history of ACE inhibitor induced cough, but has tolerated ARB therapy with losartan .  A nuclear perfusion study  demonstrated significant salvage of myocardium in the majority of the LAD territory and showed distal apical scar.  She has chronic left bundle branch block.  She has permanent atrial fibrillation and has been on Eliquis for anticoagulation. Her cha2ds2vasc score is 4.  An echo revealed an EF of 40 to 45% with moderate LVH and septal and apical akinesis secondary to her prior late presentation anterior MI.  There was  very mild aortic stenosis.  She had moderate left atrial dilatation.  Presently,, she has been without anginal symptomatology.  Her blood pressure today is stable.  However she has had issues with lower extremity edema right greater than left.  She has been on a regimen of hydrochlorothiazide in addition to isosorbide 30 mg, losartan 50 mg daily.  She continues to be on Eliquis for  anticoagulation.  With her leg swelling on hydrochlorothiazide I recommended discontinuance and we will start her on furosemide 40 mg for the next 2 days and then 20 mg daily.  She is on atorvastatin 80 mg daily and Zetia for hyperlipidemia.  Most recent lipid panel in October 2022 was excellent with LDL at 55.  She continues to use CPAP with excellent compliance.  Her machine is now over 55 years old and due to the upgrade with 5G Internet, her machine is no longer wireless.  She qualifies for a new machine.  I will place an order for her to receive a new ResMed air sense 11 AutoSet unit.  Unfortunately due to supply chain issues there may be delay of 4 to 6 months.  She continues to have issues with her neuropathy and has had bladder issues from her radiation treatments.  I will see her within 2 months following obtaining a new CPAP machine for further evaluation.   Troy Sine, MD, [facility]  04/03/2021 3:07 PM

## 2021-04-01 NOTE — Patient Instructions (Signed)
Medication Instructions:  STOP HYDROCHLOROTHIAZIDE   START FUROSEMIDE 40MG  FOR 2 DAYS THEN TAKE 20MG  (1/2 TAB) DAILY. MAY TAKE EXTRA 40MG  FOR INCREASED SWELLING.  *If you need a refill on your cardiac medications before your next appointment, please call your pharmacy*  Lab Work:   Testing/Procedures:  NONE    NONE  Special Instructions NEW CPAP MACHINE DAILY, CALL WITH ANY QUESTIONS  Follow-Up: Your next appointment:  7 month(s) In Person with Shelva Majestic, MD   Please call our office 2 months in advance to schedule this appointment   At [facility], you and your health needs are our priority.  As part of our continuing mission to provide you with exceptional heart care, we have created designated Provider Care Teams.  These Care Teams include your primary Cardiologist (physician) and Advanced Practice Providers (APPs -  Physician Assistants and Nurse Practitioners) who all work together to provide you with the care you need, when you need it.

## 2021-04-03 ENCOUNTER — Encounter: Payer: Self-pay | Admitting: Cardiovascular Disease

## 2021-05-19 DIAGNOSIS — D2271 Melanocytic nevi of right lower limb, including hip: Secondary | ICD-10-CM | POA: Diagnosis not present

## 2021-05-19 DIAGNOSIS — D225 Melanocytic nevi of trunk: Secondary | ICD-10-CM | POA: Diagnosis not present

## 2021-05-19 DIAGNOSIS — D2262 Melanocytic nevi of left upper limb, including shoulder: Secondary | ICD-10-CM | POA: Diagnosis not present

## 2021-05-19 DIAGNOSIS — D2261 Melanocytic nevi of right upper limb, including shoulder: Secondary | ICD-10-CM | POA: Diagnosis not present

## 2021-05-19 DIAGNOSIS — D2272 Melanocytic nevi of left lower limb, including hip: Secondary | ICD-10-CM | POA: Diagnosis not present

## 2021-05-19 DIAGNOSIS — L821 Other seborrheic keratosis: Secondary | ICD-10-CM | POA: Diagnosis not present

## 2021-06-05 ENCOUNTER — Other Ambulatory Visit: Payer: Self-pay | Admitting: Cardiovascular Disease

## 2021-06-09 ENCOUNTER — Ambulatory Visit: Payer: Medicare PPO | Admitting: Cardiovascular Disease

## 2021-06-13 DIAGNOSIS — E785 Hyperlipidemia, unspecified: Secondary | ICD-10-CM | POA: Diagnosis not present

## 2021-06-13 DIAGNOSIS — M810 Age-related osteoporosis without current pathological fracture: Secondary | ICD-10-CM | POA: Diagnosis not present

## 2021-06-13 DIAGNOSIS — D6869 Other thrombophilia: Secondary | ICD-10-CM | POA: Diagnosis not present

## 2021-06-13 DIAGNOSIS — I2510 Atherosclerotic heart disease of native coronary artery without angina pectoris: Secondary | ICD-10-CM | POA: Diagnosis not present

## 2021-06-13 DIAGNOSIS — G4733 Obstructive sleep apnea (adult) (pediatric): Secondary | ICD-10-CM | POA: Diagnosis not present

## 2021-06-13 DIAGNOSIS — I252 Old myocardial infarction: Secondary | ICD-10-CM | POA: Diagnosis not present

## 2021-06-13 DIAGNOSIS — I10 Essential (primary) hypertension: Secondary | ICD-10-CM | POA: Diagnosis not present

## 2021-06-13 DIAGNOSIS — I4891 Unspecified atrial fibrillation: Secondary | ICD-10-CM | POA: Diagnosis not present

## 2021-06-17 ENCOUNTER — Other Ambulatory Visit: Payer: Self-pay | Admitting: Cardiovascular Disease

## 2021-06-17 NOTE — Telephone Encounter (Signed)
Prescription refill request for Eliquis received. ?Indication:Afib ?Last office visit:1/23 ?Scr:0.8 ?Age: 87 ?Weight:95.5 kg ? ?Prescription refilled ? ?

## 2021-06-21 ENCOUNTER — Other Ambulatory Visit: Payer: Self-pay | Admitting: Family Medicine

## 2021-06-21 DIAGNOSIS — Z1231 Encounter for screening mammogram for malignant neoplasm of breast: Secondary | ICD-10-CM

## 2021-06-29 ENCOUNTER — Other Ambulatory Visit: Payer: Self-pay | Admitting: Cardiovascular Disease

## 2021-07-12 ENCOUNTER — Other Ambulatory Visit: Payer: Self-pay | Admitting: Family Medicine

## 2021-07-12 ENCOUNTER — Ambulatory Visit
Admission: RE | Admit: 2021-07-12 | Discharge: 2021-07-12 | Disposition: A | Discharge status: Home / Self Care | Payer: Medicare PPO | Source: Ambulatory Visit | Attending: Family Medicine | Admitting: Family Medicine

## 2021-07-12 DIAGNOSIS — R609 Edema, unspecified: Secondary | ICD-10-CM | POA: Diagnosis not present

## 2021-07-12 DIAGNOSIS — M79604 Pain in right leg: Secondary | ICD-10-CM | POA: Diagnosis not present

## 2021-07-12 DIAGNOSIS — I10 Essential (primary) hypertension: Secondary | ICD-10-CM | POA: Diagnosis not present

## 2021-07-12 DIAGNOSIS — M7989 Other specified soft tissue disorders: Secondary | ICD-10-CM | POA: Insufficient documentation

## 2021-07-12 DIAGNOSIS — I4891 Unspecified atrial fibrillation: Secondary | ICD-10-CM | POA: Diagnosis not present

## 2021-07-12 DIAGNOSIS — R600 Localized edema: Secondary | ICD-10-CM | POA: Diagnosis not present

## 2021-07-12 DIAGNOSIS — C55 Malignant neoplasm of uterus, part unspecified: Secondary | ICD-10-CM | POA: Diagnosis not present

## 2021-07-27 ENCOUNTER — Ambulatory Visit
Admission: RE | Admit: 2021-07-27 | Discharge: 2021-07-27 | Disposition: A | Discharge status: Home / Self Care | Payer: Medicare PPO | Source: Ambulatory Visit | Attending: Family Medicine | Admitting: Family Medicine

## 2021-07-27 DIAGNOSIS — Z1231 Encounter for screening mammogram for malignant neoplasm of breast: Secondary | ICD-10-CM | POA: Insufficient documentation

## 2021-08-01 ENCOUNTER — Telehealth: Payer: Self-pay | Admitting: *Deleted

## 2021-08-01 ENCOUNTER — Encounter: Payer: Self-pay | Admitting: Urology

## 2021-08-01 ENCOUNTER — Ambulatory Visit (INDEPENDENT_AMBULATORY_CARE_PROVIDER_SITE_OTHER): Payer: Medicare PPO | Admitting: Urology

## 2021-08-01 VITALS — BP 136/78 | HR 60 | Ht 62.0 in | Wt 210.0 lb

## 2021-08-01 DIAGNOSIS — N3946 Mixed incontinence: Secondary | ICD-10-CM | POA: Diagnosis not present

## 2021-08-01 MED ORDER — CIPROFLOXACIN HCL 500 MG PO TABS: ORAL_TABLET | ORAL | 0 refills | Status: DC

## 2021-08-01 NOTE — Telephone Encounter (Signed)
REQUEST FOR CARDIAC CLEARANCE     ? ? ? ? ?Request Clearance from Dr. Shelva Majestic ? ?Faxed to:[phone] ? ?Procedure: Botox  ? ?Date of Procedure: 09/12/21 ? ?Provider: Dr. Nicki Reaper MacDiarmid ? ? ? ?Cardiac  Clearance : Yes or No ? ?Reason: Ok to hold Eliquis 5 days prior to procedure? ?  ? ? ?Risk Assessment:  ? ? Low   '[]'$       Moderate   '[]'$     High   '[]'$  ?  ?       ?This patient is optimized for surgery  YES '[]'$       NO   '[]'$  ? ? ?I recommend further assessment/workup prior to procedure:  ? ? YES '[]'$      NO  '[]'$  ? ?Appointment scheduled for: _______________________  ? ?Further recommendations: ______________________________ ? ? ? ?Physician Signature:__________________________________  ? ?Printed Name: ________________________________________  ? ?Date: _________________   ? ?

## 2021-08-01 NOTE — Progress Notes (Signed)
? ?08/01/2021 ?9:03 AM  ? ?Susan [name] ?12/31/1934 ?[id number] ? ?Referring provider: [name], MD ?[address] ?[place],  [place] [address] ? ?Chief Complaint  ?Patient presents with  ? Urinary Incontinence  ? ? ?HPI: ?Reviewed history in detail.  Patient has had radiation for uterine cancer and chemotherapy.  We have talked about Botox with radiation.  We talked about InterStim and percutaneous tibial nerve stimulation..  She has had a lot of high-volume leakage especially triggered when she goes from a sitting to standing position.  She has been on Eliquis.  After the first bulking treatment she was dramatically better on Myrbetriq.  She was no longer having high-volume incontinence triggered by standing.  She was wearing 2 pads a day that were damp.  Blood thinner Eliquis and aspirin had been stopped prior.  ?  ?Patient was dry for approximately 5 months after first bulking treatment.  She had a second bulking treatment April 22, 2020. ?  ?Still wears 5 pads a day.  She traveled to [place] and double pad for confidence.  She leaks primarily when she goes from a sitting to standing position.  She leaks more when she gets out of a pool.  ?  ?Not convinced that treating again with a bulking treatment is in the patient's best interest.  She is going to start percutaneous tibial nerve stimulation.  If this fails she can consider Botox in the face of radiation and InterStim.  I think we need to have reasonable treatment goals. ?  ?Culture was positive ?  ?No longer being treated with percutaneous tibial nerve stimulation.  No medications.  Clinically not infected.  Still has severe incontinence.  Has tremendous pitting leg edema and is now on diuretic with dose adjustment ? ?Clinically not infected.  I went over Botox with full template and we addressed previous radiation relative.  She should not have sacral nerve stimulation.  She chose watchful waiting I was not surprised.  I will see her as needed    ? ?Today ?Urge incontinence persisting.  Leaks without warning.  Still has leg edema and now is on a new diuretic.  No recent clot.  Clinically not infected.  On Eliquis.  She like to proceed with Botox recognizing the complexity of the issue. ? ? ? ?PMH: ?Past Medical History:  ?Diagnosis Date  ? Bradycardia   ? Chronic back pain   ? Coronary artery disease   ? a. s/p STEMI in 2014 with DES to LAD with staged PCI/DES placement to LCx  ? Echoencephalogram abnormality   ? Heart murmur   ? History of Holter monitoring   ? Hyperlipidemia   ? Hypertension   ? Myocardial infarction [facility])   ? Neuromuscular disorder ([place])   ? neuropathy  ? NSVT (nonsustained ventricular tachycardia) ([place])   ? Personal history of chemotherapy   ? ENDOMETRIAL CA  ? Personal history of radiation therapy   ? ENDOMETRIAL CA  ? Uterine cancer ([place])   ? serrous  ? ? ?Surgical History: ?Past Surgical History:  ?Procedure Laterality Date  ? ABDOMINAL HYSTERECTOMY    ? BREAST EXCISIONAL BIOPSY Right 1956  ? NEG  ? CARDIOVERSION N/A 07/27/2017  ? Procedure: CARDIOVERSION;  Surgeon: Skeet Latch, MD;  Location: [facility];  Service: Cardiovascular;  Laterality: N/A;  ? LEFT HEART CATHETERIZATION WITH CORONARY ANGIOGRAM  10/19/2012  ? Procedure: LEFT HEART CATHETERIZATION WITH CORONARY ANGIOGRAM;  Surgeon: Troy Sine, MD;  Location: [facility] CATH LAB;  Service:  Cardiovascular;;  ? PERCUTANEOUS CORONARY STENT INTERVENTION (PCI-S)  10/19/2012  ? Procedure: PERCUTANEOUS CORONARY STENT INTERVENTION (PCI-S);  Surgeon: Troy Sine, MD;  [facility] CATH LAB;  Service: Cardiovascular;;  ? PERCUTANEOUS CORONARY STENT INTERVENTION (PCI-S) N/A 10/23/2012  ? Procedure: PERCUTANEOUS CORONARY STENT INTERVENTION (PCI-S);  Surgeon: Troy Sine, MD;  Location: [facility] CATH LAB;  Service: Cardiovascular;  Laterality: N/A;  ? PERIPHERAL VASCULAR CATHETERIZATION N/A 05/05/2015  ? Procedure: Porta Cath Insertion;  Surgeon: Algernon Huxley, MD;  Location: [facility] CV  LAB;  Service: Cardiovascular;  Laterality: N/A;  ? PORTA CATH REMOVAL N/A 04/05/2017  ? Procedure: PORTA CATH REMOVAL;  Surgeon: Algernon Huxley, MD;  Location: [facility] CV LAB;  Service: Cardiovascular;  Laterality: N/A;  ? ? ?Home Medications:  ?Allergies as of 08/01/2021   ? ?   Reactions  ? Ace Inhibitors Cough  ? Penicillins Rash, Other (See Comments)  ? Has patient had a PCN reaction causing immediate rash, facial/tongue/throat swelling, SOB or lightheadedness with hypotension: No ?Has patient had a PCN reaction causing severe rash involving mucus membranes or skin necrosis: No ?Has patient had a PCN reaction that required hospitalization: No ?Has patient had a PCN reaction occurring within the last 10 years: No ?If all of the above answers are "NO", then may proceed with Cephalosporin use.  ? ?  ? ?  ?Medication List  ?  ? ?  ? Accurate as of Aug 01, 2021  9:03 AM. If you have any questions, ask your nurse or doctor.  ?  ?  ? ?  ? ?STOP taking these medications   ? ?furosemide 40 MG tablet ?Commonly known as: LASIX ?Stopped by: Reece Packer, MD ?  ? ?  ? ?TAKE these medications   ? ?alendronate 70 MG tablet ?Commonly known as: FOSAMAX ?Take by mouth. ?  ?aspirin EC 81 MG tablet ?Take 81 mg by mouth daily. ?  ?atorvastatin 80 MG tablet ?Commonly known as: LIPITOR ?TAKE 1 TABLET BY MOUTH DAILY AT 6 PM. ?  ?Eliquis 5 MG Tabs tablet ?Generic drug: apixaban ?TAKE 1 TABLET BY MOUTH TWICE A DAY ?  ?ezetimibe 10 MG tablet ?Commonly known as: ZETIA ?TAKE 1 TABLET BY MOUTH EVERY DAY ?  ?fluticasone 50 MCG/ACT nasal spray ?Commonly known as: FLONASE ?SPRAY 2 SPRAYS INTO EACH NOSTRIL EVERY DAY ?  ?isosorbide mononitrate 30 MG 24 hr tablet ?Commonly known as: IMDUR ?TAKE 1 TABLET BY MOUTH EVERY DAY ?  ?loperamide 2 MG tablet ?Commonly known as: IMODIUM A-D ?Take 2 mg by mouth 4 (four) times daily as needed for diarrhea or loose stools. ?  ?losartan 25 MG tablet ?Commonly known as: COZAAR ?TAKE 2 TABLETS BY MOUTH  EVERY DAY ?  ?multivitamin with minerals Tabs tablet ?Take 1 tablet by mouth daily. ?  ?Nitrostat 0.4 MG SL tablet ?Generic drug: nitroGLYCERIN ?PLACE 1 TABLET UNDER THE TOUGUE EVERY 5 MINUTES FOR UP TO 3 DOSES AS NEEDED FOR CHEST PAIN ?  ?omeprazole 20 MG capsule ?Commonly known as: PRILOSEC ?Take 20 mg by mouth daily. ?  ?vitamin B-12 1000 MCG tablet ?Commonly known as: CYANOCOBALAMIN ?Take 1,000 mcg by mouth daily. ?  ?vitamin C 1000 MG tablet ?Take 1,000 mg by mouth daily. Taking prior to PNS procedure. ?  ? ?  ? ? ?Allergies:  ?Allergies  ?Allergen Reactions  ? Ace Inhibitors Cough  ? Penicillins Rash and Other (See Comments)  ?  Has patient had a PCN reaction causing immediate rash,  facial/tongue/throat swelling, SOB or lightheadedness with hypotension: No ?Has patient had a PCN reaction causing severe rash involving mucus membranes or skin necrosis: No ?Has patient had a PCN reaction that required hospitalization: No ?Has patient had a PCN reaction occurring within the last 10 years: No ?If all of the above answers are "NO", then may proceed with Cephalosporin use. ?  ? ? ?Family History: ?Family History  ?Problem Relation Age of Onset  ? Heart disease Mother   ? Breast cancer Mother 50  ? Heart disease Father   ? Diabetes Father   ? Diabetes Brother   ? Stroke Brother   ? Kidney cancer Neg Hx   ? Bladder Cancer Neg Hx   ? ? ?Social History:  reports that she quit smoking about 47 years ago. Her smoking use included cigarettes. She has never used smokeless tobacco. She reports current alcohol use of about 1.0 standard drink per week. She reports that she does not use drugs. ? ?ROS: ?  ? ?  ? ?  ? ?  ? ?  ? ?  ? ?  ? ?  ? ?  ? ?  ? ?  ? ?  ? ?  ? ?Physical Exam: ?BP 136/78   Pulse 60   Ht '5\' 2"'$  (1.575 m)   Wt 95.3 kg   BMI 38.41 kg/m?   ?Constitutional:  Alert and oriented, No acute distress. ? ?Laboratory Data: ?Lab Results  ?Component Value Date  ? WBC 6.1 12/15/2019  ? HGB 13.8 12/15/2019  ? HCT 40.6  12/15/2019  ? MCV 90.4 12/15/2019  ? PLT 222 12/15/2019  ? ? ?Lab Results  ?Component Value Date  ? CREATININE 0.87 12/15/2019  ? ? ?No results found for: PSA ? ?No results found for: TESTOSTERONE ? ?Lab Results  ?C

## 2021-08-02 ENCOUNTER — Telehealth: Payer: Self-pay | Admitting: *Deleted

## 2021-08-02 NOTE — Telephone Encounter (Addendum)
Patient with diagnosis of afib on Eliquis for anticoagulation.   ? ?Procedure: urologic Botox injection ?Date of procedure: 09/12/21 ? ?CHA2DS2-VASc Score = 5  ? This indicates a 7.2% annual risk of stroke. ?The patient's score is based upon: ?CHF History: 0 ?HTN History: 1 ?Diabetes History: 0 ?Stroke History: 0 ?Vascular Disease History: 1 ?Age Score: 2 ?Gender Score: 1 ?  ?CrCl 50 mL/min using adjusted body weight due to obesity ?Platelet count 230K ? ?Procedures do not require 5 day Eliquis hold. Eliquis will be cleared within a few days from the body. Per office protocol, patient can hold Eliquis for 2-3 days prior to procedure.  ?

## 2021-08-02 NOTE — Telephone Encounter (Signed)
Clinical pharmacist to review Eliquis 

## 2021-08-02 NOTE — Telephone Encounter (Signed)
? ?  Pre-operative Risk Assessment  ?  ?Patient Name: Susan [name]  ?DOB: 09/27/1934 ?MRN: [id number]  ? ?  ? ?Request for Surgical Clearance   ? ?Procedure:   botox ? ?Date of Surgery:  Clearance 09/12/21                              ?   ?Surgeon:  Dr. Nicki Reaper MacDiarmid ?Surgeon's Group or Practice Name:  [facility] ?Phone number:  [phone] ?Fax number:  [phone] ?  ?Type of Clearance Requested:   ?- Medical  ?- Pharmacy:  Hold Apixaban (Eliquis) for 5 days prior  ? ?Type of Anesthesia:     ?  ?Additional requests/questions:   ? ?Signed, ?Michaele Amundson A Aloha Bartok   ?08/02/2021, 9:20 AM  ? ?

## 2021-08-03 NOTE — Telephone Encounter (Signed)
Patient was previously cleared for similar procedure twice in 2021.  See the previous cardiac clearance request.  I left a message for the patient to call back and speak to the on-call preop APP of the day. ?

## 2021-08-03 NOTE — Telephone Encounter (Signed)
? ?  Name: Susan [name]  ?DOB: 07-05-1934  ?MRN: [id number]  ? ?Primary Cardiologist: Shelva Majestic, MD ? ?Chart reviewed as part of pre-operative protocol coverage. Patient was contacted 08/03/2021 in reference to pre-operative risk assessment for pending surgery as outlined below.  Susan [name] was last seen on 04/01/2021 by Dr. Shelva Majestic.  Since that day, Susan [name] has done well without chest pain or worsening dyspnea. ? ?Therefore, based on ACC/AHA guidelines, the patient would be at acceptable risk for the planned procedure without further cardiovascular testing.  ? ?Patient may hold Eliquis for 2-3 days and ASA for 5 days prior to the procedure and restart as soon as possible afterward at the surgeon's discretion. ? ?The patient was advised that if she develops new symptoms prior to surgery to contact our office to arrange for a follow-up visit, and she verbalized understanding. ? ?I will route this recommendation to the requesting party via Epic fax function and remove from pre-op pool. Please call with questions. ? ?[name], [name] ?08/03/2021, 4:29 PM  ?

## 2021-08-03 NOTE — Telephone Encounter (Signed)
Patient is returning PA's call.  ?

## 2021-08-10 ENCOUNTER — Other Ambulatory Visit: Payer: Self-pay

## 2021-08-10 DIAGNOSIS — N3946 Mixed incontinence: Secondary | ICD-10-CM

## 2021-08-29 ENCOUNTER — Other Ambulatory Visit: Payer: Medicare PPO

## 2021-08-29 DIAGNOSIS — N3946 Mixed incontinence: Secondary | ICD-10-CM | POA: Diagnosis not present

## 2021-08-29 LAB — MICROSCOPIC EXAMINATION: Epithelial Cells (non renal): >10 /hpf — AB (ref 0–10)

## 2021-08-29 LAB — URINALYSIS, COMPLETE
Bilirubin, UA: NEGATIVE
Glucose, UA: NEGATIVE
Ketones, UA: NEGATIVE
Leukocytes,UA: NEGATIVE
Nitrite, UA: NEGATIVE
Protein,UA: NEGATIVE
Specific Gravity, UA: 1.02 (ref 1.005–1.030)
Urobilinogen, Ur: 0.2 mg/dL (ref 0.2–1.0)
pH, UA: 6 (ref 5.0–7.5)

## 2021-08-31 LAB — CULTURE, URINE COMPREHENSIVE

## 2021-09-12 ENCOUNTER — Encounter: Payer: Self-pay | Admitting: Urology

## 2021-09-12 ENCOUNTER — Ambulatory Visit: Payer: Medicare PPO | Admitting: Urology

## 2021-09-12 VITALS — BP 102/76 | HR 69 | Ht 62.0 in | Wt 204.0 lb

## 2021-09-12 DIAGNOSIS — N3946 Mixed incontinence: Secondary | ICD-10-CM | POA: Diagnosis not present

## 2021-09-12 MED ORDER — ONABOTULINUMTOXINA 100 UNITS IJ SOLR
100.0000 [IU] | Freq: Once | INTRAMUSCULAR | Status: AC
  Administered 2021-09-12: 100 [IU] via INTRAMUSCULAR

## 2021-09-12 MED ORDER — LIDOCAINE HCL 2 % IJ SOLN
50.0000 mL | Freq: Once | INTRAMUSCULAR | Status: AC
  Administered 2021-09-12: 1000 mg

## 2021-09-12 NOTE — Addendum Note (Signed)
Addended by: Tommy Rainwater on: 09/12/2021 11:36 AM   Modules accepted: Orders

## 2021-09-12 NOTE — Progress Notes (Signed)
09/12/2021 10:20 AM   Susan [name] Aug 30, 1934 [id number]  Referring provider: [name], MD [phone] [place],  [place] [address]  Chief Complaint  Patient presents with   Botulinum Toxin Injection    HPI: I reviewed my last note in detail.  There is complex mixed incontinence.  She has failed 2 bulking agent treatments.  She takes Eliquis.  Recent urine culture negative  Clinically not infected.  Has ongoing urge incontinence high-volume  Patient underwent flexible cystoscopy.  Bladder mucosa and trigone were normal.  No cystitis.  I injected 100 units of Botox in multiple sites throughout the bladder and the lower half of the bladder.  I spared the trigone.  10 injections of 1 cc.  Very well-tolerated.  No leading.   PMH: Past Medical History:  Diagnosis Date   Bradycardia    Chronic back pain    Coronary artery disease    a. s/p STEMI in 2014 with DES to LAD with staged PCI/DES placement to LCx   Echoencephalogram abnormality    Heart murmur    History of Holter monitoring    Hyperlipidemia    Hypertension    Myocardial infarction ([place])    Neuromuscular disorder ([facility])    neuropathy   NSVT (nonsustained ventricular tachycardia) ([place])    Personal history of chemotherapy    ENDOMETRIAL CA   Personal history of radiation therapy    ENDOMETRIAL CA   Uterine cancer ([place])    serrous    Surgical History: Past Surgical History:  Procedure Laterality Date   ABDOMINAL HYSTERECTOMY     BREAST EXCISIONAL BIOPSY Right 1956   NEG   CARDIOVERSION N/A 07/27/2017   Procedure: CARDIOVERSION;  Surgeon: Skeet Latch, MD;  Location: [facility];  Service: Cardiovascular;  Laterality: N/A;   LEFT HEART CATHETERIZATION WITH CORONARY ANGIOGRAM  10/19/2012   Procedure: LEFT HEART CATHETERIZATION WITH CORONARY ANGIOGRAM;  Surgeon: Troy Sine, MD;  Location: [facility] CATH LAB;  Service: Cardiovascular;;   PERCUTANEOUS CORONARY STENT INTERVENTION (PCI-S)  10/19/2012    Procedure: PERCUTANEOUS CORONARY STENT INTERVENTION (PCI-S);  Surgeon: Troy Sine, MD;  Location: [facility] CATH LAB;  Service: Cardiovascular;;   PERCUTANEOUS CORONARY STENT INTERVENTION (PCI-S) N/A 10/23/2012   Procedure: PERCUTANEOUS CORONARY STENT INTERVENTION (PCI-S);  Surgeon: Troy Sine, MD;  Location: [facility] CATH LAB;  Service: Cardiovascular;  Laterality: N/A;   PERIPHERAL VASCULAR CATHETERIZATION N/A 05/05/2015   Procedure: Glori Luis Cath Insertion;  Surgeon: Algernon Huxley, MD;  Location: [facility] CV LAB;  Service: Cardiovascular;  Laterality: N/A;   PORTA CATH REMOVAL N/A 04/05/2017   Procedure: PORTA CATH REMOVAL;  Surgeon: Algernon Huxley, MD;  Location: [facility] CV LAB;  Service: Cardiovascular;  Laterality: N/A;    Home Medications:  Allergies as of 09/12/2021       Reactions   Ace Inhibitors Cough   Penicillins Rash, Other (See Comments)   Has patient had a PCN reaction causing immediate rash, facial/tongue/throat swelling, SOB or lightheadedness with hypotension: No Has patient had a PCN reaction causing severe rash involving mucus membranes or skin necrosis: No Has patient had a PCN reaction that required hospitalization: No Has patient had a PCN reaction occurring within the last 10 years: No If all of the above answers are "NO", then may proceed with Cephalosporin use.        Medication List        Accurate as of September 12, 2021 10:20 AM. If you have any questions, ask your  nurse or doctor.          alendronate 70 MG tablet Commonly known as: FOSAMAX Take by mouth.   aspirin EC 81 MG tablet Take 81 mg by mouth daily.   atorvastatin 80 MG tablet Commonly known as: LIPITOR TAKE 1 TABLET BY MOUTH DAILY AT 6 PM.   ciprofloxacin 500 MG tablet Commonly known as: CIPRO Take 1 tablet the day prior, on day of, and day after the procedure   Eliquis 5 MG Tabs tablet Generic drug: apixaban TAKE 1 TABLET BY MOUTH TWICE A DAY   ezetimibe 10 MG tablet Commonly known  as: ZETIA TAKE 1 TABLET BY MOUTH EVERY DAY   fluticasone 50 MCG/ACT nasal spray Commonly known as: FLONASE SPRAY 2 SPRAYS INTO EACH NOSTRIL EVERY DAY   isosorbide mononitrate 30 MG 24 hr tablet Commonly known as: IMDUR TAKE 1 TABLET BY MOUTH EVERY DAY   loperamide 2 MG tablet Commonly known as: IMODIUM A-D Take 2 mg by mouth 4 (four) times daily as needed for diarrhea or loose stools.   losartan 25 MG tablet Commonly known as: COZAAR TAKE 2 TABLETS BY MOUTH EVERY DAY   multivitamin with minerals Tabs tablet Take 1 tablet by mouth daily.   Nitrostat 0.4 MG SL tablet Generic drug: nitroGLYCERIN PLACE 1 TABLET UNDER THE TOUGUE EVERY 5 MINUTES FOR UP TO 3 DOSES AS NEEDED FOR CHEST PAIN   omeprazole 20 MG capsule Commonly known as: PRILOSEC Take 20 mg by mouth daily.   vitamin B-12 1000 MCG tablet Commonly known as: CYANOCOBALAMIN Take 1,000 mcg by mouth daily.   vitamin C 1000 MG tablet Take 1,000 mg by mouth daily. Taking prior to PNS procedure.        Allergies:  Allergies  Allergen Reactions   Ace Inhibitors Cough   Penicillins Rash and Other (See Comments)    Has patient had a PCN reaction causing immediate rash, facial/tongue/throat swelling, SOB or lightheadedness with hypotension: No Has patient had a PCN reaction causing severe rash involving mucus membranes or skin necrosis: No Has patient had a PCN reaction that required hospitalization: No Has patient had a PCN reaction occurring within the last 10 years: No If all of the above answers are "NO", then may proceed with Cephalosporin use.     Family History: Family History  Problem Relation Age of Onset   Heart disease Mother    Breast cancer Mother 14   Heart disease Father    Diabetes Father    Diabetes Brother    Stroke Brother    Kidney cancer Neg Hx    Bladder Cancer Neg Hx     Social History:  reports that she quit smoking about 47 years ago. Her smoking use included cigarettes. She has  never used smokeless tobacco. She reports current alcohol use of about 1.0 standard drink of alcohol per week. She reports that she does not use drugs.  ROS:                                        Physical Exam: There were no vitals taken for this visit.  Constitutional:  Alert and oriented, No acute distress. HEENT: [place] AT, moist mucus membranes.  Trachea midline, no masses.   Laboratory Data: Lab Results  Component Value Date   WBC 6.1 12/15/2019   HGB 13.8 12/15/2019   HCT 40.6 12/15/2019   MCV 90.4  12/15/2019   PLT 222 12/15/2019    Lab Results  Component Value Date   CREATININE 0.87 12/15/2019    No results found for: "PSA"  No results found for: "TESTOSTERONE"  Lab Results  Component Value Date   HGBA1C 5.7 (H) 10/19/2012    Urinalysis    Component Value Date/Time   COLORURINE YELLOW (A) 02/20/2018 0905   APPEARANCEUR Hazy (A) 08/29/2021 1456   LABSPEC 1.013 02/20/2018 0905   PHURINE 5.0 02/20/2018 0905   GLUCOSEU Negative 08/29/2021 1456   HGBUR MODERATE (A) 02/20/2018 0905   BILIRUBINUR Negative 08/29/2021 [place] 02/20/2018 0905   PROTEINUR Negative 08/29/2021 1456   PROTEINUR 30 (A) 02/20/2018 0905   NITRITE Negative 08/29/2021 1456   NITRITE NEGATIVE 02/20/2018 0905   LEUKOCYTESUR Negative 08/29/2021 1456    Pertinent Imaging:   Assessment & Plan: Follow patient as per protocol for complex mixed urinary incontinence  There are no diagnoses linked to this encounter.  No follow-ups on file.  Reece Packer, MD  [facility] [address] [place], [place] [address] [phone]

## 2021-09-12 NOTE — Progress Notes (Signed)
Bladder Instillation  Pre-Botox Procedure, patient is present today for a Bladder Instillation of Lidocaine 2%. Patient was cleaned and prepped in a sterile fashion with betadine and lidocaine 2% jelly was instilled into the urethra.  A 14FR catheter was inserted,  50 ml of Lidocaine 2% was instilled into the bladder. The catheter was then removed. Patient tolerated well, no complications were noted Patient held in bladder for 30 minutes prior to procedure starting.   Performed by: Fonnie Jarvis, CMA

## 2021-09-21 ENCOUNTER — Inpatient Hospital Stay: Payer: Medicare PPO | Attending: Obstetrics and Gynecology | Admitting: Obstetrics and Gynecology

## 2021-09-21 VITALS — Ht 62.0 in | Wt 210.0 lb

## 2021-09-21 DIAGNOSIS — R32 Unspecified urinary incontinence: Secondary | ICD-10-CM | POA: Insufficient documentation

## 2021-09-21 DIAGNOSIS — Z9221 Personal history of antineoplastic chemotherapy: Secondary | ICD-10-CM | POA: Insufficient documentation

## 2021-09-21 DIAGNOSIS — Z90722 Acquired absence of ovaries, bilateral: Secondary | ICD-10-CM | POA: Diagnosis not present

## 2021-09-21 DIAGNOSIS — Z90710 Acquired absence of both cervix and uterus: Secondary | ICD-10-CM | POA: Diagnosis not present

## 2021-09-21 DIAGNOSIS — Z8542 Personal history of malignant neoplasm of other parts of uterus: Secondary | ICD-10-CM

## 2021-09-21 DIAGNOSIS — Z923 Personal history of irradiation: Secondary | ICD-10-CM | POA: Insufficient documentation

## 2021-09-21 DIAGNOSIS — C541 Malignant neoplasm of endometrium: Secondary | ICD-10-CM

## 2021-09-21 NOTE — Progress Notes (Signed)
[place] at [facility]  [phone]) [phone] [phone]  Patient Care Team: [name], MD as PCP - General (Family Medicine) Constance Haw, MD as PCP - Electrophysiology (Cardiology) Troy Sine, MD as PCP - Cardiology (Cardiology) Mellody Drown, MD as Referring Physician (Obstetrics and Gynecology)   Name of the patient: Susan [name]  [id number]  Jan 06, 1935   Date of visit: 09/21/2021  Oncology History:  Oncology History Overview Note  Endometrial cancer stage IIIc    Carcinoma of endometrium ([place])  04/02/2015 Initial Diagnosis   Carcinoma of endometrium [name])    Gynecologic Oncology Interval Visit   Referring Provider: Dr Joylene Igo  Chief Concern: stage IIIC1serous endometrial cancer  Subjective:  Susan [name] is a 87 y.o., P3 female, initially seen in consultation from Dr. 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   She continues to have chronic urinary leakage. No improvement with mrybetriq. She continues to have injections of botox with urology. She has chronic peripheral neuropathy.   Mammogram was 07/28/21- negative  09/30/2020 CA125 14.7    Gynecologic Oncology History  Susan [name] is a 87 y.o. P3 female with stage IIIC1 mixed endometrial carcinoma composed of serous carcinoma and endometrioid adenocarcinoma s/p TLH/BSO and staging 04/08/15 at [facility].  Please see complete details in prior notes.   Path report below shows stage IIIC1 disease.  Washings were negative, but she had 3 positive SLNs in pelvis.  An aortic SLN was negative.     A. Lymph node, sentinel, right external iliac, excision:  Minute focus of metastatic carcinoma involving one lymph  node (1/1). See note. Size of metastasis: Less than 0.1 mm. Negative for extranodal invasion.   Note: A cytokeratin AE1/3 highlights the metastatic focus.   B. Lymph node, sentinel, right external iliac #2, excision: Metastatic carcinoma involving one lymph node (1/1). See note. Size of metastasis: 0.5 mm. Negative for extranodal invasion. Note: A cytokeratin AE1/3 highlights the metastatic focus.   C. Lymph node, sentinel, right aortic, excision: One benign lymph node (0/1). Note: A cytokeratin AE1/3 immunostain is negative.    D. Lymph node, sentinel, left external iliac, excision:  Metastatic adenocarcinoma involving one lymph node (1/1).   Size of metastasis: 1 cm. Negative for extranodal invasion. Note: A cytokeratin AE1/3 highlights the metastatic focus.   E. 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  Tumor size: 5.1 cm. The tumor invades 2 mm into a 14 mm thick myometrium. Negative for cervical involvement. Lymphovascular invasion is present.   04/28/2015 CA125 82.5   Recommendation to proceed with with carboplatinum and paclitaxel three-four cycles followed by radiation therapy followed by additional 2-3 cycles of chemotherapy. She started on chemotherapy on 05/12/2015 and received 2nd cycle on 06/02/2015. She developed significant neuropathy impairing her quality of life with complaints of sensory ataxia, frequent falls, and needs to walk with the help of walker. Chemotherapy was discontinued. Then received 4500 cGy pelvic radiation and 1200 cGy using high dose rate remote afterloading through vaginal cylinder completed 7/17.  08/31/2016 Abdominal and pelvic CT scan revealed no evidence of recurrent disease.  There was a new sacral insufficiency fracture.  She was referred to surgery.  Scans revealed  a 3 mm pulmonary nodule.  Repeat chest CT  in 3 months was recommended.  Pelvic MRI on 10/23/2016 revealed bilateral sacral insufficiency fractures with possible insufficiency fractures of the left greater than right bilateral iliac wings.  Lumbar spine MRI revealed diffuse degenerative disease.  Decision was made not to perform a sacral plasty.  She was seen by Dr. Deetta Perla on 11/07/2016.  Surgical decompression or fusion was not recommended. Recommendation was made to the pain clinic for treatment (medication, injections, or spinal cord stimulator). She is currently seeing a chiropractor and has had 6 treatments.  Chest CT on 12/05/2016 revealed scattered right lung nodules measuring up to 6 mm (mean diameter) in the right upper lobe along the minor fissure.  Metastatic disease was not excluded.  Recommendation was for follow-up CT in 3-6 months.   CA125 values:  04/28/2015  82.5  06/02/2015  22.0 07/14/2015  18.2 10/21/2015  11.7 01/21/2016  16.4 04/25/2016  14.2 08/15/2016  13.6 12/07/2016   16.2 03/08/2017  16.1 09/17/2019  14.9 12/15/2019  16.5 09/30/2020  14.7    03/06/2017 CT Chest WO Contrast: - she had a known h/o pulmonary nodules.. Findings stable tiny sub-cm right lung nodules   CT C/A/P 12/12/17- performed for back pain; stable pulmonary nodules. No evidence of metastatic or recurrent disease elsewhere. Aortic atherosclerosis. Degenerative changes in thoracolumbar spine. Mild sclerosis in sacrum.    Uro-Gyn History She had a prior history of urinary incontinence and was seen by Dr. Matilde Sprang, Urology on 08/13/17. They discussed percutaneous tibial nerve stimulation.  02/26/2019-  continued bladder leakage. She was referred to [place] after this visit.    07/14/2019- UroGynecology, Tonye Becket, NP; unable to fit pessary d/t radiation changes. Follow up with Dr. Stephanie Coup for urethral bulking was recommending. Continue to be followed by Dr. MacDiarmid/urology. Receiving botox injections to bladder.      Problem  List: Patient Active Problem List   Diagnosis Date Noted   Lumbar degenerative disc disease 10/22/2019   Lumbar facet arthropathy 09/04/2019   Chronic pain syndrome 09/04/2019   Difficulty walking 06/26/2019   Bilateral swelling of feet 11/19/2018   Low back pain radiating to both legs 11/19/2018   Numbness and tingling of both feet 11/19/2018   Chemotherapy-induced neuropathy ([place]) 04/17/2018   Neuropathy 04/17/2018   Chemotherapy-induced peripheral neuropathy ([place]) 03/01/2018   Persistent atrial fibrillation ([place])    Port-A-Cath in place 03/28/2017   Bradycardia 01/18/2017   Obesity (BMI 30-39.9) 01/17/2017   Pulmonary nodules 12/05/2016   Compression fracture of L5 vertebra with delayed healing 11/01/2016   Sacral insufficiency fracture with delayed healing 11/01/2016   Sacral insufficiency fracture 09/05/2016   Absence of bladder continence 07/21/2015   Neuropathy associated with cancer ([place]) 06/02/2015   Carcinoma of endometrium ([place]) 04/02/2015   Gastro-esophageal reflux disease without esophagitis 02/15/2015   Osteopenia 08/17/2014   Hyperlipidemia 02/13/2014   Kidney stones 10/27/2013   CA skin, basal cell 06/30/2013   Spinal stenosis, lumbar region, with neurogenic claudication 06/30/2013   Arthritis, degenerative 06/30/2013   Neuralgia neuritis, sciatic nerve 06/30/2013   Coronary artery disease 10/25/2012   Dyslipidemia 10/25/2012   NSVT (nonsustained ventricular tachycardia) ([place]) 10/25/2012   Sick sinus syndrome ([place]) 10/25/2012   Ischemic cardiomyopathy 10/22/2012   Old anterior wall myocardial infarction 10/20/2012   Hypertensive heart disease without CHF 10/20/2012    Past Medical History: Past Medical History:  Diagnosis Date   Bradycardia    Chronic back pain    Coronary  artery disease    a. s/p STEMI in 2014 with DES to LAD with staged PCI/DES placement to LCx   Echoencephalogram abnormality    Heart murmur    History of Holter monitoring     Hyperlipidemia    Hypertension    Myocardial infarction [facility])    Neuromuscular disorder (HCC)    neuropathy   NSVT (nonsustained ventricular tachycardia) ([place])    Personal history of chemotherapy    ENDOMETRIAL CA   Personal history of radiation therapy    ENDOMETRIAL CA   Uterine cancer ([place])    serrous    Past Surgical History: Past Surgical History:  Procedure Laterality Date   ABDOMINAL HYSTERECTOMY     BREAST EXCISIONAL BIOPSY Right 1956   NEG   CARDIOVERSION N/A 07/27/2017   Procedure: CARDIOVERSION;  Surgeon: Skeet Latch, MD;  Location: [place];  Service: Cardiovascular;  Laterality: N/A;   LEFT HEART CATHETERIZATION WITH CORONARY ANGIOGRAM  10/19/2012   Procedure: LEFT HEART CATHETERIZATION WITH CORONARY ANGIOGRAM;  Surgeon: Troy Sine, MD;  Location: [facility] CATH LAB;  Service: Cardiovascular;;   PERCUTANEOUS CORONARY STENT INTERVENTION (PCI-S)  10/19/2012   Procedure: PERCUTANEOUS CORONARY STENT INTERVENTION (PCI-S);  Surgeon: Troy Sine, MD;  Location: [facility] CATH LAB;  Service: Cardiovascular;;   PERCUTANEOUS CORONARY STENT INTERVENTION (PCI-S) N/A 10/23/2012   Procedure: PERCUTANEOUS CORONARY STENT INTERVENTION (PCI-S);  Surgeon: Troy Sine, MD;  Location: [facility] CATH LAB;  Service: Cardiovascular;  Laterality: N/A;   PERIPHERAL VASCULAR CATHETERIZATION N/A 05/05/2015   Procedure: Glori Luis Cath Insertion;  Surgeon: Algernon Huxley, MD;  Location: [place] CV LAB;  Service: Cardiovascular;  Laterality: N/A;   PORTA CATH REMOVAL N/A 04/05/2017   Procedure: PORTA CATH REMOVAL;  Surgeon: Algernon Huxley, MD;  Location: [place] CV LAB;  Service: Cardiovascular;  Laterality: N/A;    Family History: Family History  Problem Relation Age of Onset   Heart disease Mother    Breast cancer Mother 11   Heart disease Father    Diabetes Father    Diabetes Brother    Stroke Brother    Kidney cancer Neg Hx    Bladder Cancer Neg Hx     Social History: Social History    Socioeconomic History   Marital status: Widowed    Spouse name: Not on file   Number of children: Not on file   Years of education: Not on file   Highest education level: Not on file  Occupational History   Not on file  Tobacco Use   Smoking status: Former    Types: Cigarettes    Quit date: 48    Years since quitting: 47.5   Smokeless tobacco: Never  Vaping Use   Vaping Use: Never used  Substance and Sexual Activity   Alcohol use: Yes    Alcohol/week: 1.0 standard drink of alcohol    Types: 1 Glasses of wine per week   Drug use: No   Sexual activity: Not Currently  Other Topics Concern   Not on file  Social History Narrative   Not on file   Social Determinants of Health   Financial Resource Strain: Not on file  Food Insecurity: Not on file  Transportation Needs: Not on file  Physical Activity: Not on file  Stress: Not on file  Social Connections: Not on file  Intimate Partner Violence: Not on file    Allergies: Allergies  Allergen Reactions   Ace Inhibitors Cough   Penicillins Rash and Other (See Comments)  Has patient had a PCN reaction causing immediate rash, facial/tongue/throat swelling, SOB or lightheadedness with hypotension: No Has patient had a PCN reaction causing severe rash involving mucus membranes or skin necrosis: No Has patient had a PCN reaction that required hospitalization: No Has patient had a PCN reaction occurring within the last 10 years: No If all of the above answers are "NO", then may proceed with Cephalosporin use.     Current Medications: Current Outpatient Medications  Medication Instructions   alendronate (FOSAMAX) 70 MG tablet Oral   aspirin EC 81 mg, Daily   atorvastatin (LIPITOR) 80 MG tablet TAKE 1 TABLET BY MOUTH DAILY AT 6 PM.   ELIQUIS 5 MG TABS tablet TAKE 1 TABLET BY MOUTH TWICE A DAY   ezetimibe (ZETIA) 10 MG tablet TAKE 1 TABLET BY MOUTH EVERY DAY   fluticasone (FLONASE) 50 MCG/ACT nasal spray SPRAY 2 SPRAYS  INTO EACH NOSTRIL EVERY DAY   isosorbide mononitrate (IMDUR) 30 MG 24 hr tablet TAKE 1 TABLET BY MOUTH EVERY DAY   loperamide (IMODIUM A-D) 2 mg, Oral, 4 times daily PRN   losartan (COZAAR) 25 MG tablet TAKE 2 TABLETS BY MOUTH EVERY DAY   Multiple Vitamin (MULTIVITAMIN WITH MINERALS) TABS 1 tablet, Daily   NITROSTAT 0.4 MG SL tablet PLACE 1 TABLET UNDER THE TOUGUE EVERY 5 MINUTES FOR UP TO 3 DOSES AS NEEDED FOR CHEST PAIN   omeprazole (PRILOSEC) 20 mg, Daily   vitamin B-12 (CYANOCOBALAMIN) 1,000 mcg, Oral, Daily   vitamin C 1,000 mg, Oral, Daily, Taking prior to PNS procedure.    General: no complaints  HEENT: no complaints  Lungs: no complaints  Cardiac: no complaints  GI: no complaints  GU: Reports incontinence, urine leakage; on-going    Musculoskeletal: chronic back pain  Extremities: leg swelling   Skin: no complaints  Neuro: Reports persistent neuropathy  Endocrine: no complaints  Psych: no complaints       Objective:  Physical Examination:  Ht '5\' 2"'$  (1.575 m)   Wt 210 lb (95.3 kg)   BMI 38.41 kg/m    ECOG Performance Status: 1 - Symptomatic but completely ambulatory  GENERAL: Patient is a older appearing female in no acute distress. She walks with a cane HEENT:  Sclera clear. Anicteric NODES:  Negative axillary, supraclavicular, inguinal lymph node survery LUNGS:  Clear to auscultation bilaterally.   HEART:  Regular rate and rhythm.  ABDOMEN:  Soft, nontender.  No hernias, incisions well healed. No masses or ascites EXTREMITIES:  Bilateral peripheral edema. Atraumatic. No cyanosis SKIN:  Clear with no obvious rashes or skin changes.  NEURO:  Nonfocal. Well oriented.  Appropriate affect.  PELVIC: Exam Chaperoned by RN.  Urethra: normal appearing; Urinary leakage.   Vulva: normal appearing vulva with no masses, tenderness or lesions Vagina: atrophic; no lesions, vaginal shortening; cuff visualized BME: no masses;  Uterus/Cervix:surgically absent Rectal:  deferred   Assessment:  Susan [name] is a 87 y.o. 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  CT scan 9/19 normal and no change in indeterminate lung lesions. NED today on exam. She is no longer followed by medical oncology or radiation oncology.   Urinary incontinence - related to radiation and post operative changes. Managed by UroGyn at [facility] and Urology/Dr. Matilde Sprang. S/p botox injections one week  ago with no improvement.   Peripheral neuropathy- likely due to taxane chemotherapy. Chronic   Back pain: Previous imaging revealed DDD. Chronic & unchanged.   Plan: Continue surveillance- She is now 5 years out from diagnosis. NCCN guidelines reviewed today. She will continue annual surveillance visits with pelvic exams. CA 125 is good marker for her and has been normal for years. We talked about continuing to assess this lab marker.  Labs not drawn today. We will plan to order ca-125 at her next visit. If normal we can discontinue.   Screening mammograms and other health screenings to be scheduled by Dr. Lovie Macadamia, patient's pcp.   Urinary incontinence- continue follow up with uro-gyn and urology  CIPN- Continue follow up with Dr. Potter/Neurology as needed.    RTC in 1 year for continued surveillance.   Beckey Rutter, DNP, AGNP-[facility] [place] [facility] [phone] (clinic)  I personally had a face to face interaction and evaluated the patient jointly with the NP, Ms. Beckey Rutter.  I have reviewed her history and available records and have performed the key portions of the physical exam including general, HEENT, extremities, abdominal exam, and pelvic exam with my findings confirming  those documented above by the APP.  I have discussed the case with the APP and the patient.  I agree with the above documentation, assessment and plan which was fully formulated by me.  Counseling was completed by me.   I personally saw the patient and performed a substantive portion of this encounter in conjunction with the listed APP as documented above.  Aryana Wonnacott Gaetana Michaelis, MD

## 2021-09-21 NOTE — Progress Notes (Signed)
Patient reports: urine leakage, knee pain, back leg swelling, chronic neuropathy.

## 2021-09-30 ENCOUNTER — Ambulatory Visit: Payer: Medicare PPO | Admitting: Physician Assistant

## 2021-10-04 ENCOUNTER — Ambulatory Visit: Payer: Medicare PPO | Admitting: Physician Assistant

## 2021-10-04 VITALS — BP 118/70 | HR 61 | Ht 62.0 in | Wt 210.0 lb

## 2021-10-04 DIAGNOSIS — N3946 Mixed incontinence: Secondary | ICD-10-CM | POA: Diagnosis not present

## 2021-10-04 LAB — URINALYSIS, COMPLETE
Bilirubin, UA: NEGATIVE
Glucose, UA: NEGATIVE
Ketones, UA: NEGATIVE
Nitrite, UA: NEGATIVE
Specific Gravity, UA: 1.02 (ref 1.005–1.030)
Urobilinogen, Ur: 0.2 mg/dL (ref 0.2–1.0)
pH, UA: 5.5 (ref 5.0–7.5)

## 2021-10-04 LAB — MICROSCOPIC EXAMINATION
RBC, Urine: NONE SEEN /hpf (ref 0–2)
WBC, UA: >30 /hpf — ABNORMAL HIGH (ref 0–5)

## 2021-10-04 LAB — BLADDER SCAN AMB NON-IMAGING

## 2021-10-04 MED ORDER — SULFAMETHOXAZOLE-TRIMETHOPRIM 800-160 MG PO TABS: 1.0000 | ORAL_TABLET | Freq: Two times a day (BID) | ORAL | 0 refills | Status: AC

## 2021-10-04 NOTE — Progress Notes (Signed)
10/04/2021 4:41 PM   Susan [name] June 21, 1934 [id number]  CC: Chief Complaint  Patient presents with   Urinary Incontinence   Follow-up   HPI: Susan [name] is a 87 y.o. female with PMH mixed incontinence who failed bulking treatments with Dr. Matilde Sprang who underwent intravesical Botox on 09/12/2021 who presents today for PVR.   Today she reports she has noticed no change in her urinary symptoms after intravesical Botox.  She felt she may have been starting to get a UTI 5 days ago and took Azo.  Her symptoms have since improved.  In-office UA today positive for 1+ blood, 1+ protein, and 2+ leukocyte esterase; urine microscopy with >30 WBCs/HPF and many bacteria.  PVR 26 mL.  PMH: Past Medical History:  Diagnosis Date   Bradycardia    Chronic back pain    Coronary artery disease    a. s/p STEMI in 2014 with DES to LAD with staged PCI/DES placement to LCx   Echoencephalogram abnormality    Heart murmur    History of Holter monitoring    Hyperlipidemia    Hypertension    Myocardial infarction ([place])    Neuromuscular disorder ([facility])    neuropathy   NSVT (nonsustained ventricular tachycardia) ([place])    Personal history of chemotherapy    ENDOMETRIAL CA   Personal history of radiation therapy    ENDOMETRIAL CA   Uterine cancer ([place])    serrous    Surgical History: Past Surgical History:  Procedure Laterality Date   ABDOMINAL HYSTERECTOMY     BREAST EXCISIONAL BIOPSY Right 1956   NEG   CARDIOVERSION N/A 07/27/2017   Procedure: CARDIOVERSION;  Surgeon: Skeet Latch, MD;  Location: [facility];  Service: Cardiovascular;  Laterality: N/A;   LEFT HEART CATHETERIZATION WITH CORONARY ANGIOGRAM  10/19/2012   Procedure: LEFT HEART CATHETERIZATION WITH CORONARY ANGIOGRAM;  Surgeon: Troy Sine, MD;  Location: [facility] CATH LAB;  Service: Cardiovascular;;   PERCUTANEOUS CORONARY STENT INTERVENTION (PCI-S)  10/19/2012   Procedure: PERCUTANEOUS CORONARY STENT INTERVENTION  (PCI-S);  Surgeon: Troy Sine, MD;  Location: [facility] CATH LAB;  Service: Cardiovascular;;   PERCUTANEOUS CORONARY STENT INTERVENTION (PCI-S) N/A 10/23/2012   Procedure: PERCUTANEOUS CORONARY STENT INTERVENTION (PCI-S);  Surgeon: Troy Sine, MD;  Location: [facility] CATH LAB;  Service: Cardiovascular;  Laterality: N/A;   PERIPHERAL VASCULAR CATHETERIZATION N/A 05/05/2015   Procedure: Glori Luis Cath Insertion;  Surgeon: Algernon Huxley, MD;  Location: [facility] CV LAB;  Service: Cardiovascular;  Laterality: N/A;   PORTA CATH REMOVAL N/A 04/05/2017   Procedure: PORTA CATH REMOVAL;  Surgeon: Algernon Huxley, MD;  Location: [facility] CV LAB;  Service: Cardiovascular;  Laterality: N/A;    Home Medications:  Allergies as of 10/04/2021       Reactions   Ace Inhibitors Cough   Penicillins Rash, Other (See Comments)   Has patient had a PCN reaction causing immediate rash, facial/tongue/throat swelling, SOB or lightheadedness with hypotension: No Has patient had a PCN reaction causing severe rash involving mucus membranes or skin necrosis: No Has patient had a PCN reaction that required hospitalization: No Has patient had a PCN reaction occurring within the last 10 years: No If all of the above answers are "NO", then may proceed with Cephalosporin use.        Medication List        Accurate as of October 04, 2021  4:41 PM. If you have any questions, ask your nurse or doctor.  alendronate 70 MG tablet Commonly known as: FOSAMAX Take by mouth.   aspirin EC 81 MG tablet Take 81 mg by mouth daily.   atorvastatin 80 MG tablet Commonly known as: LIPITOR TAKE 1 TABLET BY MOUTH DAILY AT 6 PM.   Eliquis 5 MG Tabs tablet Generic drug: apixaban TAKE 1 TABLET BY MOUTH TWICE A DAY   ezetimibe 10 MG tablet Commonly known as: ZETIA TAKE 1 TABLET BY MOUTH EVERY DAY   fluticasone 50 MCG/ACT nasal spray Commonly known as: FLONASE SPRAY 2 SPRAYS INTO EACH NOSTRIL EVERY DAY   isosorbide  mononitrate 30 MG 24 hr tablet Commonly known as: IMDUR TAKE 1 TABLET BY MOUTH EVERY DAY   loperamide 2 MG tablet Commonly known as: IMODIUM A-D Take 2 mg by mouth 4 (four) times daily as needed for diarrhea or loose stools.   losartan 25 MG tablet Commonly known as: COZAAR TAKE 2 TABLETS BY MOUTH EVERY DAY   multivitamin with minerals Tabs tablet Take 1 tablet by mouth daily.   Nitrostat 0.4 MG SL tablet Generic drug: nitroGLYCERIN PLACE 1 TABLET UNDER THE TOUGUE EVERY 5 MINUTES FOR UP TO 3 DOSES AS NEEDED FOR CHEST PAIN   omeprazole 20 MG capsule Commonly known as: PRILOSEC Take 20 mg by mouth daily.   sulfamethoxazole-trimethoprim 800-160 MG tablet Commonly known as: BACTRIM DS Take 1 tablet by mouth 2 (two) times daily for 5 days.   vitamin B-12 1000 MCG tablet Commonly known as: CYANOCOBALAMIN Take 1,000 mcg by mouth daily.   vitamin C 1000 MG tablet Take 1,000 mg by mouth daily. Taking prior to PNS procedure.        Allergies:  Allergies  Allergen Reactions   Ace Inhibitors Cough   Penicillins Rash and Other (See Comments)    Has patient had a PCN reaction causing immediate rash, facial/tongue/throat swelling, SOB or lightheadedness with hypotension: No Has patient had a PCN reaction causing severe rash involving mucus membranes or skin necrosis: No Has patient had a PCN reaction that required hospitalization: No Has patient had a PCN reaction occurring within the last 10 years: No If all of the above answers are "NO", then may proceed with Cephalosporin use.     Family History: Family History  Problem Relation Age of Onset   Heart disease Mother    Breast cancer Mother 61   Heart disease Father    Diabetes Father    Diabetes Brother    Stroke Brother    Kidney cancer Neg Hx    Bladder Cancer Neg Hx     Social History:   reports that she quit smoking about 47 years ago. Her smoking use included cigarettes. She has never used smokeless tobacco.  She reports current alcohol use of about 1.0 standard drink of alcohol per week. She reports that she does not use drugs.  Physical Exam: BP 118/70   Pulse 61   Ht '5\' 2"'$  (1.575 m)   Wt 210 lb (95.3 kg)   BMI 38.41 kg/m   Constitutional:  Alert and oriented, no acute distress, nontoxic appearing HEENT: [place], AT Cardiovascular: No clubbing, cyanosis, or edema Respiratory: Normal respiratory effort, no increased work of breathing Skin: No rashes, bruises or suspicious lesions Neurologic: Grossly intact, no focal deficits, moving all 4 extremities Psychiatric: Normal mood and affect  Laboratory Data: Results for orders placed or performed in visit on 10/04/21  Microscopic Examination   Urine  Result Value Ref Range   WBC, UA >30 (H) 0 -  5 /hpf   RBC, Urine None seen 0 - 2 /hpf   Epithelial Cells (non renal) 0-10 0 - 10 /hpf   Mucus, UA Present (A) Not Estab.   Bacteria, UA Many (A) None seen/Few  Urinalysis, Complete  Result Value Ref Range   Specific Gravity, UA 1.020 1.005 - 1.030   pH, UA 5.5 5.0 - 7.5   Color, UA Yellow Yellow   Appearance Ur Clear Clear   Leukocytes,UA 2+ (A) Negative   Protein,UA 1+ (A) Negative/Trace   Glucose, UA Negative Negative   Ketones, UA Negative Negative   RBC, UA 1+ (A) Negative   Bilirubin, UA Negative Negative   Urobilinogen, Ur 0.2 0.2 - 1.0 mg/dL   Nitrite, UA Negative Negative   Microscopic Examination See below:   Bladder Scan (Post Void Residual) in office  Result Value Ref Range   Scan Result 81m    Assessment & Plan:   1. Mixed incontinence Emptying appropriately.  UA is grossly infected today.  Will start empiric Bactrim and send for culture for further evaluation.  We discussed that UTI could be masking symptomatic improvement from Botox.  Patient to follow-up with Dr. MMatilde Sprangas needed. - Urinalysis, Complete - Bladder Scan (Post Void Residual) in office - CULTURE, URINE COMPREHENSIVE - sulfamethoxazole-trimethoprim  (BACTRIM DS) 800-160 MG tablet; Take 1 tablet by mouth 2 (two) times daily for 5 days.  Dispense: 10 tablet; Refill: 0  Return if symptoms worsen or fail to improve.  SDebroah Loop PA-C  [facility] [address] [place] [phone]

## 2021-10-04 NOTE — Progress Notes (Signed)
In and Out Catheterization  Patient is present today for a I & O catheterization due to urinary incontinence. Patient was cleaned and prepped in a sterile fashion with betadine . A 14FR cath was inserted no complications were noted , 34m of urine return was noted, urine was dark yellow in color. A clean urine sample was collected for urine culture. Bladder was drained  And catheter was removed with out difficulty.    Performed by: CBradly BienenstockCMA

## 2021-10-07 LAB — CULTURE, URINE COMPREHENSIVE

## 2021-10-27 ENCOUNTER — Ambulatory Visit: Payer: Medicare PPO | Admitting: Cardiovascular Disease

## 2021-10-27 ENCOUNTER — Encounter: Payer: Self-pay | Admitting: Cardiovascular Disease

## 2021-10-27 VITALS — BP 135/80 | HR 58 | Ht 62.0 in | Wt 212.4 lb

## 2021-10-27 DIAGNOSIS — I252 Old myocardial infarction: Secondary | ICD-10-CM

## 2021-10-27 DIAGNOSIS — Z7901 Long term (current) use of anticoagulants: Secondary | ICD-10-CM

## 2021-10-27 DIAGNOSIS — I4821 Permanent atrial fibrillation: Secondary | ICD-10-CM | POA: Diagnosis not present

## 2021-10-27 DIAGNOSIS — I447 Left bundle-branch block, unspecified: Secondary | ICD-10-CM

## 2021-10-27 DIAGNOSIS — E785 Hyperlipidemia, unspecified: Secondary | ICD-10-CM

## 2021-10-27 DIAGNOSIS — G4733 Obstructive sleep apnea (adult) (pediatric): Secondary | ICD-10-CM

## 2021-10-27 NOTE — Patient Instructions (Signed)
Medication Instructions:    No changes  *If you need a refill on your cardiac medications before your next appointment, please call your pharmacy*   Lab Work:  Not needed   Testing/Procedures:  Not needed  Follow-Up: At [facility], you and your health needs are our priority.  As part of our continuing mission to provide you with exceptional heart care, we have created designated Provider Care Teams.  These Care Teams include your primary Cardiologist (physician) and Advanced Practice Providers (APPs -  Physician Assistants and Nurse Practitioners) who all work together to provide you with the care you need, when you need it.     Your next appointment:   12 month(s)  The format for your next appointment:   In Person  Provider:   Dr Shelva Majestic   Other Instructions   pressure changes were done to your C-PAP

## 2021-10-31 ENCOUNTER — Encounter: Payer: Self-pay | Admitting: Cardiovascular Disease

## 2021-10-31 NOTE — Progress Notes (Signed)
Patient ID: Susan [name], female   DOB: Feb 11, 1935, 87 y.o.   MRN: [id number]      HPI: Susan [name] is a 87 y.o. female who presents to the office today for a 7 month follow-up cardiology evaluation.  Susan [name] is originally from [place], [place] and now resides in [place], [place]. On 10/19/2012 she presented [facility] in transfer from [facility] with an anterior wall ST segment elevation myocardial infarction. She had chest pain of greater than 12 hours duration. I performed emergent cardiac catheterization  where her LAD was found to be totally occluded.  She underwent stenting of her LAD extending from the ostium to mid region with 3.0x38 mm and 3.0x16 mm Promus Premier DES stents. Initially she had significant wall motion abnormality involving the LAD territory. She also was found to have a 95% mid AV groove circumflex stenosis.  On 10/23/2012 she underwent successful staged intervention with insertion of a 2.25x12 mm Promus premier DES stent into the circumflex vessel. Subsequently she had done well. She did develop some hypotension leading to reduction in some of her medications.  She was enrolled in the [facility] cardiac rehabilitation program. An exercise Myoview study on 12/03/2012 demonstrated significant salvage of myocardium with only a small area of distal antero-apical, apical, septal and  apical scar without evidence for ischemia. Post-rest ejection fraction was 66% and there was evidence for apical akinesis. Laboratory revealed an increased LDL particle number at 1352 despite being on atorvastatin 80 mg. She was contacted and Zetia 10 mg was just added to this atorvastatin dose. She did have 735 small LDL particles and a calculated LDL of 90 with HDL 40 and triglycerides 119.  She had developed hematuria and was found to have left kidney stones.  She underwent lithotripsy for her multiple kidney stones. A nuclear perfusion study prior to potential  discontinuance of dual anti-platelet therapy on 11/05/2013  was interpreted as low risk, but did show extensive scar in the distal LAD to return distribution.  Post-rest ejection fraction was 40%.  There was evidence for apical akinesis/mild dyskinesis.  She has been documented to have episodes of nonsustained ventricular tachycardia and has seen [name]..  Her bradycardia has been more profound while sleeping.  She was able to exercise and walk without limitation other than back discomfort.  She was evaluated for possible indication for pacemaker was not felt that a pacemaker is indicated presently.  Because of her bradycardia she has not been on beta blocker therapy.  She has had episodes of nonsustained VT noted on a CardioNet monitor.  When I  saw her she admitted that her sleep was poor and nonrestorative.  She snored loudly and required frequent daytime naps.  He was sleeping at least 8 hours per night, but poor quality.  She underwent a split-night protocol sleep study on 03/08/2015 and was found to have severe sleep apnea with an AHI of 35.8 overall and more severe with REM sleep with an AHI of 67.5 per hour.  She had significant oxygen desaturation to a nadir of 77%.  There was loud snoring.  I recommended tai chi CPAP therapy at 9 cm water pressure.  Since initiating CPAP therapy.  She has felt well.  She is no longer having to take naps.  She is using choice for DME company.  When I saw her one year ago, she was meeting Medicare compliance. A download was obtained from 05/01/2015 through 05/30/2015 with usage stays at 93% and  70% of days greater than 4 hours.  Her AHI was excellent at 1.9 with a set pressure of 9 cm.    She was diagnosed with endometrial cancer and underwent a total hysterectomy.  She also has a Port-A-Cath that had undergone chemotherapy and also completed 25 radiation remains to her body and 3 intravaginally..  She has developed a neuropathy for which he takes gabapentin.  He  is unaware of chest pain or palpitations.    When I saw her in 2018 she continued to use CPAP with 100% compliance.  A download from 06/12/2016 through 06/13/2016 100% of usage stays.  She has a ResMed AirSense10 AutoSet unit set at a pressure of 9 cm.  Her AHI is 1.1.  She is using a Risk manager &Paykel Eson nasal mask.  There is no leak.    Since I saw her in March 2018, she has done well from a cardiac standpoint.  She denies chest pain, PND, orthopnea, or palpitations.  She continues to teach water aerobics.  However, she has continued to have issues as result of her radiation and chemotherapy for her endometrial CA.  She has developed a neuropathy and as result, has been walking with a cane or a walker.  Her gabapentin dose has been increased to 3 times per day.  She also has a small fracture in her sacrum.  She underwent initial evaluation at [facility] for possible kyphoplasty, but ultimately this was not done.  She also has degenerative disc disease in L3-L5 for which she has been seen pain management.  She has undergone injection.  She we having an additional injection next week and will need to hold Brilinta.  She also has had radiation-induced urinary incontinence.  A recent CT has demonstrated 5 small nodules in her long and she will be undergoing a follow-up CT in December.  When I saw her in October 2018 a CPAP download  from 11/27/2016 -12/26/2016 confirmed 100% compliance . AHI was1.2 at her 9 cm water pressure.  She was averaging 7 hours and 57 minutes of sleep per night.  An Epworth Sleepiness Scale score was 1.  She subsequently was found to have significant bradycardia on event monitor.  She had heart rates down to the mid to low 30s.  There were 2 episodes where her heart rate dropped to 25 bpm and then 19 bpm.  She was not on any AV nodal blocking drugs.  I recommended that she go to the emergency room for evaluation.  She was admitted overnight.  She was evaluated by [name] the following day.   He reviewed her Holter monitor.  At that time, no AF was appreciated but there were periods of Mobitz 1 block, rates in the 20s with pauses and junctional escape beats.  Her telemetry on that particular day was showing sinus bradycardia in the 40s-50s.  The patient admitted that she was entirely asymptomatic.  At that point the decision was made not to place a permanent pacemaker.  She subsequently saw [facility] in office follow-up in January 2019.  When I saw her in March 2019 she was much more tired and sleepy.  She was not using CPAP since she was coughing.  She also was using Mucinex DM for her chest congestion.  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  She has issues with back discomfort.  During that evaluation, her ECG showed that she was in atrial fibrillation at 67 bpm  with left bundle branch block with a rates variable ranging from low 40s to approximately 88.  An echo Doppler study on June 27, 2017 showed an EF of 40 to 45%.  There was mild aortic stenosis with a valve area estimated 1.82 cm.  She had moderate left atrial dilatation.  There was mild pulmonary hypertension with a PA peak pressure 36 mm.    She saw [name] for cardiology/EP follow-up on July 17, 2017.  At that time she was not having any episodes of significant dizziness, presyncope or syncope.  They discussed an attempt at trying to get her back into sinus rhythm and she has been on Eliquis.  She underwent an outpatient successful cardioversion on Jul 27, 2017 for restoration of sinus rhythm at 1 shock at 150 J.   I saw her in June 2019 at which time she had issues with urinary incontinence as result of damage from radiation treatment.  She admits to 1-2+ ankle swelling.  She is unaware of her cardiac rhythm.  She is now back on CPAP with 100% compliance and is no longer coughing.    She has undergone recent evaluation with [name] on April 17, 2019.  Her  ECG showed atrial fibrillation with left bundle branch block.  She denies episodes of palpitations, chest pain shortness of breath orthopnea PND, dizziness presyncope or syncope.  Unfortunately, her husband fell in January and injured his hip.  Following a short hospitalization he was advised that he goes to a rehab center for physical therapy.  Unfortunately he contracted Covid during that time, subsequently was transferred to [facility] and unfortunately died on 31-May-2019 after being on a ventilator with COVID-19 infection.  I saw her in March 2021.  At that time she was having significant difficulty with the recent death of her husband from [place].  She denied any chest pain PND orthopnea or palpitations.  She is now living by herself.  She continued to use CPAP therapy.  Her insurance has changed and she will need to change her provider which currently is [facility] [place] in [place].  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   When I saw her on June 08, 2019 she was having issues with neuropathy and  bladder problems from her radiation treatment.  She also recently underwent Botox administration to her bladder.  She denies any recurrent anginal symptoms or shortness of breath.  She is unaware of palpitations.  She has continued to be on isosorbide 30 mg and losartan 50 mg daily.  She is on Eliquis for anticoagulation.  She continues to be on atorvastatin 80 mg and Zetia 10 mg for hyperlipidemia.  She is followed by Dr. Lovie Macadamia at the [facility] in [place] for primary care.  She continues to use CPAP with 100% compliance.  I obtained a download from February 12 through June 06, 2020 which shows 100% usage with average use at 6 hours and 48 minutes.  At 9 cm set pressure, AHI is excellent at 1.2.   I last evaluated her on April 01, 2021.  Since her last evaluation with me, she has been wearing a left foot brace  due to a dropfoot.  She has noticed leg swelling right greater than left.  She has continued to use her CPAP machine, but her unit is no longer wireless with her initial set up date in December 2016.  She brought  her machine in today so that we could access data with a card.  She continues to be compliant on a download from December 8 through April 01, 2021 with average use at 6 hours and 26 minutes.  At 9 cm set pressure AHI is 1.3.  She continues to have issues with her neuropathy.  She denies chest pain or palpitations.  With her leg swelling on hydrochlorothiazide I recommended that this be discontinued and recommended initiation of furosemide 40 mg for 2 days then 20 mg daily.  She continues to be on atorvastatin 80 mg and Zetia for hyperlipidemia with most recent LDL at 55.  She continues to use CPAP with excellent compliance.  Her machine was no longer wireless and was over 29 years old.  She qualifies for a new machine and I placed an order for her to receive a new ResMed AirSense 11 AutoSet unit.  I discussed with her delay in getting this due to supply chain issues.  Susan [name] received a new ResMed air sense 11 CPAP auto unit with set up date on September 15, 2021.  A download was obtained from July 1 through October 23, 2021.  Compliance is excellent and she is averaging 6 hours and 27 minutes of CPAP use per night.  Typically she goes to bed between 1030 and 11 PM and often has to wake up at 4 AM with her cat and feeds her at that time and then she goes back to bed.  At 9 cm set pressure, AHI is excellent at 1.3.  Past Medical History:  Diagnosis Date   Bradycardia    Chronic back pain    Coronary artery disease    a. s/p STEMI in 2014 with DES to LAD with staged PCI/DES placement to LCx   Echoencephalogram abnormality    Heart murmur    History of Holter monitoring    Hyperlipidemia    Hypertension    Myocardial infarction ([place])    Neuromuscular disorder (HCC)    neuropathy   NSVT  (nonsustained ventricular tachycardia) ([place])    Personal history of chemotherapy    ENDOMETRIAL CA   Personal history of radiation therapy    ENDOMETRIAL CA   Uterine cancer ([place])    serrous    Past Surgical History:  Procedure Laterality Date   ABDOMINAL HYSTERECTOMY     BREAST EXCISIONAL BIOPSY Right 1956   NEG   CARDIOVERSION N/A 07/27/2017   Procedure: CARDIOVERSION;  Surgeon: Skeet Latch, MD;  Location: [facility];  Service: Cardiovascular;  Laterality: N/A;   LEFT HEART CATHETERIZATION WITH CORONARY ANGIOGRAM  10/19/2012   Procedure: LEFT HEART CATHETERIZATION WITH CORONARY ANGIOGRAM;  Surgeon: Troy Sine, MD;  Location: [facility] CATH LAB;  Service: Cardiovascular;;   PERCUTANEOUS CORONARY STENT INTERVENTION (PCI-S)  10/19/2012   Procedure: PERCUTANEOUS CORONARY STENT INTERVENTION (PCI-S);  Surgeon: Troy Sine, MD;  Location[facility] C[facility] LAB;  Service: Cardiovascular;;   PERCUTANEOUS CORONARY STENT INTERVENTION (PCI-S) N/A 10/23/2012   Procedure: PERCUTANEOUS CORONARY STENT INTERVENTION (PCI-S);  Surgeon: Troy Sine, MD;  Location: [facility] CATH LAB;  Service: Cardiovascular;  Laterality: N/A;   PERIPHERAL VASCULAR CATHETERIZATION N/A 05/05/2015   Procedure: Glori Luis Cath Insertion;  Surgeon: Algernon Huxley, MD;  Location: [facility] CV LAB;  Service: Cardiovascular;  Laterality: N/A;   PORTA CATH REMOVAL N/A 04/05/2017   Procedure: PORTA CATH REMOVAL;  Surgeon: Algernon Huxley, MD;  Location: [place] [facility] CV LAB;  Service: Cardiovascular;  Laterality: N/A;  Allergies  Allergen Reactions   Ace Inhibitors Cough   Penicillins Rash and Other (See Comments)    Has patient had a PCN reaction causing immediate rash, facial/tongue/throat swelling, SOB or lightheadedness with hypotension: No Has patient had a PCN reaction causing severe rash involving mucus membranes or skin necrosis: No Has patient had a PCN reaction that required hospitalization: No Has patient had a PCN reaction  occurring within the last 10 years: No If all of the above answers are "NO", then may proceed with Cephalosporin use.     Current Outpatient Medications  Medication Sig Dispense Refill   alendronate (FOSAMAX) 70 MG tablet Take by mouth.     Ascorbic Acid (VITAMIN C) 1000 MG tablet Take 1,000 mg by mouth daily. Taking prior to PNS procedure.     aspirin EC 81 MG tablet Take 81 mg by mouth daily.     atorvastatin (LIPITOR) 80 MG tablet TAKE 1 TABLET BY MOUTH DAILY AT 6 PM. 90 tablet 3   ELIQUIS 5 MG TABS tablet TAKE 1 TABLET BY MOUTH TWICE A DAY 180 tablet 1   ezetimibe (ZETIA) 10 MG tablet TAKE 1 TABLET BY MOUTH EVERY DAY 90 tablet 3   fluticasone (FLONASE) 50 MCG/ACT nasal spray SPRAY 2 SPRAYS INTO EACH NOSTRIL EVERY DAY     isosorbide mononitrate (IMDUR) 30 MG 24 hr tablet TAKE 1 TABLET BY MOUTH EVERY DAY 90 tablet 3   loperamide (IMODIUM A-D) 2 MG tablet Take 2 mg by mouth 4 (four) times daily as needed for diarrhea or loose stools.      losartan (COZAAR) 25 MG tablet TAKE 2 TABLETS BY MOUTH EVERY DAY 180 tablet 3   Multiple Vitamin (MULTIVITAMIN WITH MINERALS) TABS Take 1 tablet by mouth daily.     NITROSTAT 0.4 MG SL tablet PLACE 1 TABLET UNDER THE TOUGUE EVERY 5 MINUTES FOR UP TO 3 DOSES AS NEEDED FOR CHEST PAIN 25 tablet 0   omeprazole (PRILOSEC) 20 MG capsule Take 20 mg by mouth daily.     vitamin B-12 (CYANOCOBALAMIN) 1000 MCG tablet Take 1,000 mcg by mouth daily.     No current facility-administered medications for this visit.   Facility-Administered Medications Ordered in Other Visits  Medication Dose Route Frequency Provider Last Rate Last Admin   sodium chloride flush (NS) 0.9 % injection 10 mL  10 mL Intravenous PRN Charlaine Dalton R, MD   10 mL at 06/06/16 1447   sodium chloride flush (NS) 0.9 % injection 10 mL  10 mL Intravenous PRN Cammie Sickle, MD   10 mL at 08/15/16 1404    Social History   Socioeconomic History   Marital status: Widowed    Spouse  name: Not on file   Number of children: Not on file   Years of education: Not on file   Highest education level: Not on file  Occupational History   Not on file  Tobacco Use   Smoking status: Former    Types: Cigarettes    Quit date: 61    Years since quitting: 47.6   Smokeless tobacco: Never  Vaping Use   Vaping Use: Never used  Substance and Sexual Activity   Alcohol use: Yes    Alcohol/week: 1.0 standard drink of alcohol    Types: 1 Glasses of wine per week   Drug use: No   Sexual activity: Not Currently  Other Topics Concern   Not on file  Social History Narrative   Not on file  Social Determinants of Health   Financial Resource Strain: Not on file  Food Insecurity: Not on file  Transportation Needs: Not on file  Physical Activity: Not on file  Stress: Not on file  Social Connections: Not on file  Intimate Partner Violence: Not on file    Socially she is married. There is no tobacco use. She drinks alcohol rarely.  Family History  Problem Relation Age of Onset   Heart disease Mother    Breast cancer Mother 45   Heart disease Father    Diabetes Father    Diabetes Brother    Stroke Brother    Kidney cancer Neg Hx    Bladder Cancer Neg Hx     ROS General: Negative; No fevers, chills, or night sweats;  HEENT: Negative; No changes in vision or hearing, sinus congestion, difficulty swallowing Pulmonary: Negative; No cough, wheezing, shortness of breath, hemoptysis Cardiovascular:  See HPI GI: Positive for GERD; No nausea, vomiting, diarrhea, or abdominal pain FG:[id number] of previous kidney stones, contributing to left flank pain intermittently; No dysuria, hematuria, or difficulty voiding; radiation-induced urinary incontinence Musculoskeletal: Positive for small sacral fracture, L3-L5 degenerative disc disease. Hematologic/Oncology: Positive for endometrial cancer, status post chemotherapy and radiation treatments. Endocrine: Negative; no heat/cold  intolerance; no diabetes Neuro: Neuropathy, left foot drop Skin: Negative; No rashes or skin lesions Psychiatric: Negative; No behavioral problems, depression Sleep: Positive for obstructive sleep apnea, on CPAP therapy.  No breakthrough snoring.  No daytime sleepiness. Other comprehensive 14 point system review is negative.   PE BP 135/80   Pulse (!) 58   Ht _0  (1.575 m)   Wt 212 lb 6.4 oz (96.3 kg)   BMI 38.85 kg/m    Repeat blood pressure by me was 126/72  Wt Readings from Last 3 Encounters:  10/27/21 212 lb 6.4 oz (96.3 kg)  10/04/21 210 lb (95.3 kg)  09/21/21 210 lb (95.3 kg)   General: Alert, oriented, no distress.  Skin: normal turgor, no rashes, warm and dry HEENT: Normocephalic, atraumatic. Pupils equal round and reactive to light; sclera anicteric; extraocular muscles intact;  Nose without nasal septal hypertrophy Mouth/Parynx benign; Mallinpatti scale 3 Neck: No JVD, no carotid bruits; normal carotid upstroke Lungs: clear to ausculatation and percussion; no wheezing or rales Chest wall: without tenderness to palpitation Heart: PMI not displaced, RRR, s1 s2 normal, 1/6 systolic murmur, no diastolic murmur, no rubs, gallops, thrills, or heaves Abdomen: soft, nontender; no hepatosplenomehaly, BS+; abdominal aorta nontender and not dilated by palpation. Back: no CVA tenderness Pulses 2+ Musculoskeletal: Mild swelling right knee Extremities: Mild ankle swelling; no clubbing cyanosis, Homan's sign negative  Neurologic: She has been trying acupuncture for neuropathy which was not successful Psychologic: Normal mood and affect   October 27, 2021 ECG (independently read by me): Atrial fibrillation with ventricular rate of 58 bpm.  Left bundle branch block.  April 01, 2021 ECG (independently read by me):  Atrial fibrillation at 56, LBBB  March 2021 ECG (independently read by me): Atrial fibrillation at 70; LBBB, QTc 453  June 2019 ECG (independently read by me):  Atrial fibrillation with ventricular rate at 53 bpm.  Left bundle branch block with repolarization changes.  QTc interval 4 1 ms.  June 15, 2017 ECG (independently read by me): Atrial fibrillation at 67 bpm.  Left bundle branch block. Variable rate ranging from probably in the , low 40s to approximately 88  October 2018 ECG (independently read by me): Marked sinus bradycardia with PACs.  Ventricular  rate at 43.  Left bundle branch block with repolarization changes.  March 2018 ECG (independently read by me): Sinus bradycardia 57 bpm.  First-degree AV block with a PR interval 314 ms. .She now has a left bundle-branch block which is new since her prior tracing.  March 2017 ECG (independently read by me): Sinus bradycardia 52 bpm with first-degree block.  PR interval 284 ms.  LVH by voltage.  QS V1 through V3 concordant with old anteroseptal MI.  March 2016 ECG (independently read by me): Normal sinus rhythm.  Old anteroseptal MI.  First-degree AV block.  October 27, 2013 ECG (independently read by me): Normal sinus rhythm at 60 beats per minute.  First degree AV block with PR interval at 276 ms.  Old anteroseptal MI with QS complex in V1 and V2.  EKG (independently read by me): Sinus rhythm at 55 beats per minute. First degree AV block. Poor progression compatible with her prior septal infarction. T-wave changes anterolaterally.  LABS:    Latest Ref Rng & Units 12/15/2019    9:17 AM 02/26/2019    2:32 PM 12/16/2018   10:32 AM  BMP  Glucose 70 - 99 mg/dL 116  125  137   BUN 8 - 23 mg/dL _0 Creatinine 0.44 - 1.00 mg/dL 0.87  1.06  0.96   Sodium 135 - 145 mmol/L 141  137  140   Potassium 3.5 - 5.1 mmol/L 3.8  3.7  3.8   Chloride 98 - 111 mmol/L 106  103  105   CO2 22 - 32 mmol/L _1 Calcium 8.9 - 10.3 mg/dL 9.4  9.5  9.5       Latest Ref Rng & Units 12/15/2019    9:17 AM 02/26/2019    2:32 PM 12/16/2018   10:32 AM  Hepatic Function  Total Protein 6.5 - 8.1 g/dL 7.1  6.9   7.0   Albumin 3.5 - 5.0 g/dL 4.0  4.0  4.0   AST 15 - 41 U/L _2 ALT 0 - 44 U/L _3 Alk Phosphatase 38 - 126 U/L 92  92  91   Total Bilirubin 0.3 - 1.2 mg/dL 0.8  0.9  0.7       Latest Ref Rng & Units 12/15/2019    9:17 AM 02/26/2019    2:32 PM 12/16/2018   10:32 AM  CBC  WBC 4.0 - 10.5 K/uL 6.1  5.6  6.0   Hemoglobin 12.0 - 15.0 g/dL 13.8  12.6  12.8   Hematocrit 36.0 - 46.0 % 40.6  38.7  38.1   Platelets 150 - 400 K/uL 222  213  198    Lab Results  Component Value Date   MCV 90.4 12/15/2019   MCV 93.7 02/26/2019   MCV 92.5 12/16/2018   Lab Results  Component Value Date   TSH 0.97 06/19/2016   Lab Results  Component Value Date   HGBA1C 5.7 (H) 10/19/2012   Lipid Panel     Component Value Date/Time   CHOL 116 06/06/2017 0922   CHOL 141 04/16/2013 0924   TRIG 75 06/06/2017 0922   TRIG 112 04/16/2013 0924   HDL 51 06/06/2017 0922   HDL 53 04/16/2013 0924   CHOLHDL 2.3 06/06/2017 0922   CHOLHDL 2.2 06/19/2016 0950   VLDL 20 06/19/2016 0950   LDLCALC 50 06/06/2017 0922   LDLCALC 66  04/16/2013 0924   RADIOLOGY: No results found.  IMPRESSION:  1. OSA (obstructive sleep apnea)   2. Old MI (myocardial infarction): Anterior STEMI 10/19/2012   3. Permanent atrial fibrillation ([place])   4. Left bundle branch block   5. Hyperlipidemia with target LDL less than 70   6. Anticoagulation adequate     ASSESSMENT AND PLAN: Susan [name] is an 87 year-old female who presented to [facility] on 10/19/2012 with a STEMI after an approximate 12 hour delay with back and chest discomfort. When she arrived to [facility] she had excellent door to balloon time at 27 minutes in the setting of an anterior wall ST segment elevation myocardial infarction. She underwent successful stenting of her LAD and because of diffuse disease tandem DES stents were inserted.  She underwent staged intervention to the circumflex  4 days later. She had concomitant 50 - 60% RCA  narrowing and a large dominant right coronary artery.  She has a history of ACE inhibitor induced cough, but has tolerated ARB therapy with losartan .  A nuclear perfusion study  demonstrated significant salvage of myocardium in the majority of the LAD territory and showed distal apical scar.  She has chronic left bundle branch block.  She has permanent atrial fibrillation and has been on Eliquis for anticoagulation. Her cha2ds2vasc score is 4.  An echo revealed an EF of 40 to 45% with moderate LVH and septal and apical akinesis secondary to her prior late presentation anterior MI.  There was very mild aortic stenosis.  She had moderate left atrial dilatation.  Presently,, she has been without anginal symptomatology.  At her January 2023 evaluation, she had leg swelling and at that time I recommended discontinuance of HCTZ.  I recommended initiation of furosemide.  She has had issues with back soreness as well as right knee soreness with very minimal feet swelling.  She did try acupuncture for her neuropathy but this was unsuccessful.  She has been undergoing Botox for her bladder urinary leakage which has not been very successful.  Her blood pressure today is stable on current therapy with isosorbide 30 mg and losartan 25 mg daily.  It appears that she is no longer taking daily furosemide.  She is on Zetia 10 mg and atorvastatin 80 mg for hyperlipidemia.  LDL cholesterol in October 2022 was 55.  She will be seeing Dr. [name] her primary physician in June and repeat laboratory will be obtained.  She received her new ResMed air sense 11 CPAP unit.  She is meeting compliance.  AHI is excellent at 1.3 at her 9 cm set pressure.  I discussed improved sleep duration of at least 7 to 8 hours if at all possible.  Apparently her cat starts crawling on her at 4 AM and wanting food for which she gets up and feeds the cat on a daily basis.  Her atrial fibrillation rate is well-controlled.  She is now taking classes at  [facility].  I will see her in 1 year for reevaluation or sooner as needed.    Troy Sine, MD, [facility]  10/31/2021 12:30 PM

## 2021-11-03 ENCOUNTER — Other Ambulatory Visit: Payer: Self-pay | Admitting: Cardiovascular Disease

## 2021-11-03 NOTE — Telephone Encounter (Signed)
Prescription refill request for Eliquis received. Indication: Atrial Fib Last office visit: 10/27/21 Scr: 0.9 on 07/12/21 Age: 87 Weight: 96.3kg  Based on above findings Eliquis '5mg'$  twice daily is the appropriate dose.  Refill approved.

## 2021-12-03 IMAGING — MR MR LUMBAR SPINE W/O CM
4 of 5 series · 30 of 48 positions shown · non-contrast
Comparison: MRI of the lumbar spine December 12, 2017

CLINICAL DATA: Low back pain with lumbar radiculopathy.

EXAM:
MRI LUMBAR SPINE WITHOUT CONTRAST
TECHNIQUE: Multiplanar, multisequence MR imaging of the lumbar spine was
performed. No intravenous contrast was administered.

[Series 5: T2 · sagittal · 4.0mm · 0.81mm/px · 7 of 17 slices shown (1 of 2)]
[im 1/17]
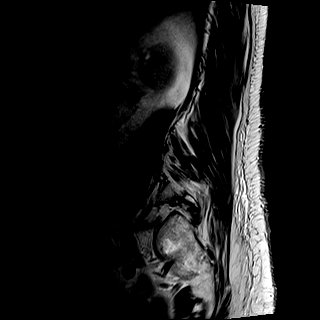
[im 3/17]
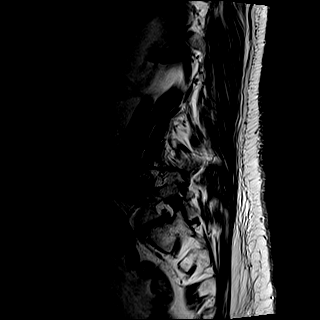
[im 6/17]
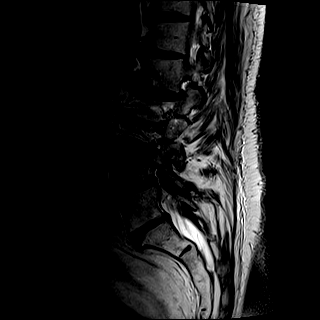
[im 9/17]
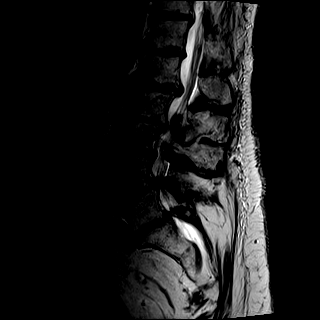
[im 11/17]
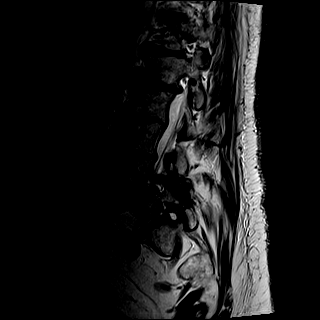
[im 14/17]
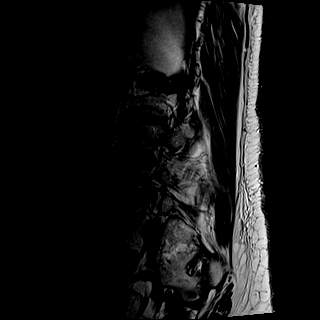
[im 17/17]
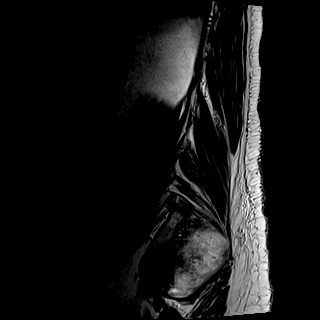

[Series 6: T1 · sagittal · 4.0mm · 0.81mm/px · 6 of 17 slices shown (1 of 2)]
[im 1/17]
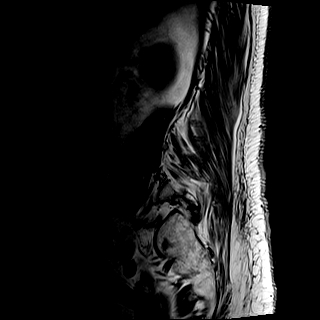
[im 4/17]
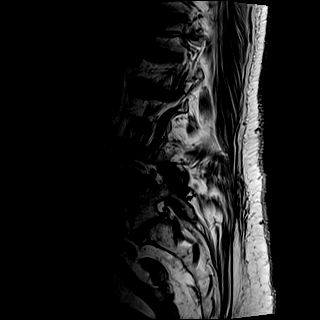
[im 7/17]
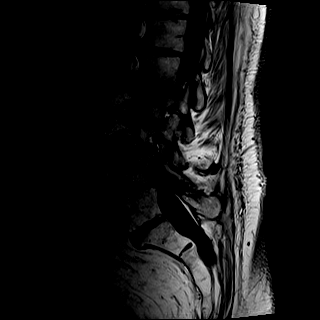
[im 10/17]
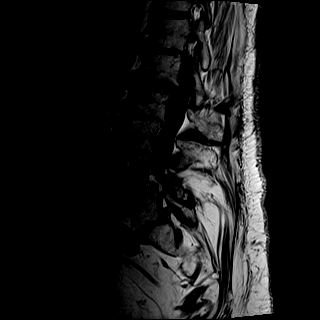
[im 13/17]
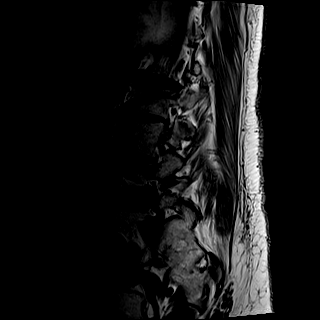
[im 17/17]
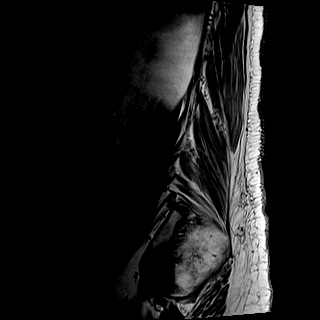

[Series 10: T2 · axial · 4.0mm · 0.66mm/px · z∈[-108,+103]mm · 8 of 38 slices shown (2 of 2)]
[im 1/38]
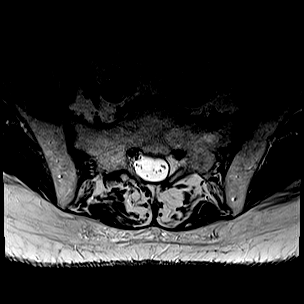
[im 6/38]
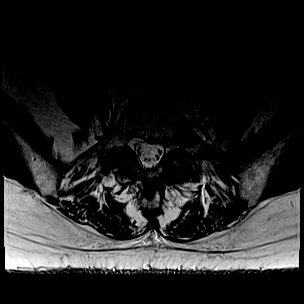
[im 12/38]
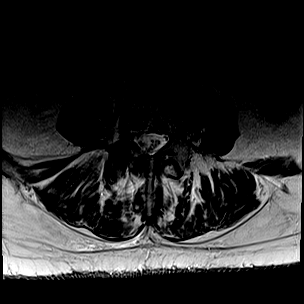
[im 18/38]
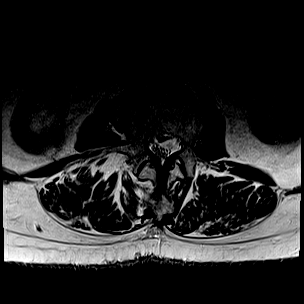
[im 20/38]
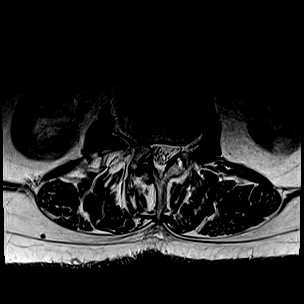
[im 26/38]
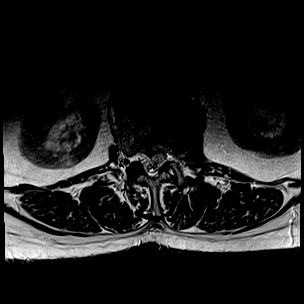
[im 32/38]
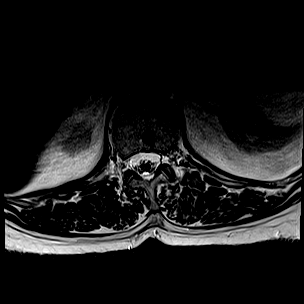
[im 38/38]
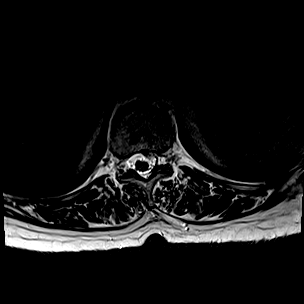

[Series 13: T1 · axial · 4.0mm · 0.39mm/px · z∈[-112,+103]mm · 9 of 40 slices shown (2 of 2)]
[im 1/40]
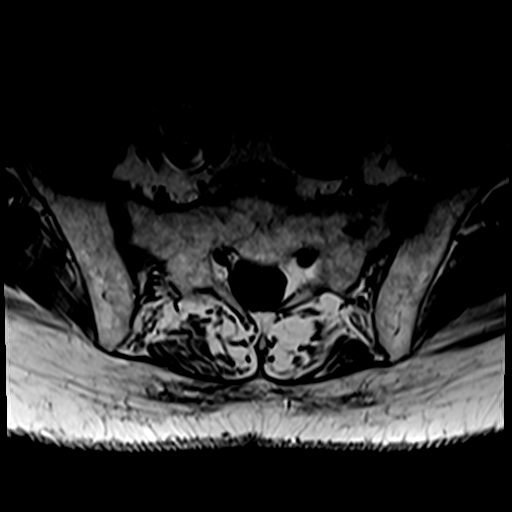
[im 6/40]
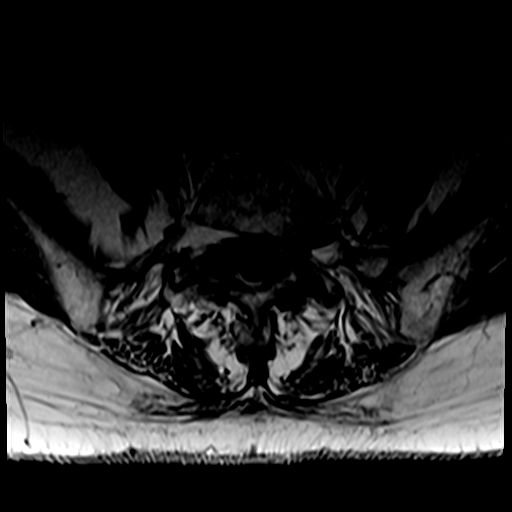
[im 12/40]
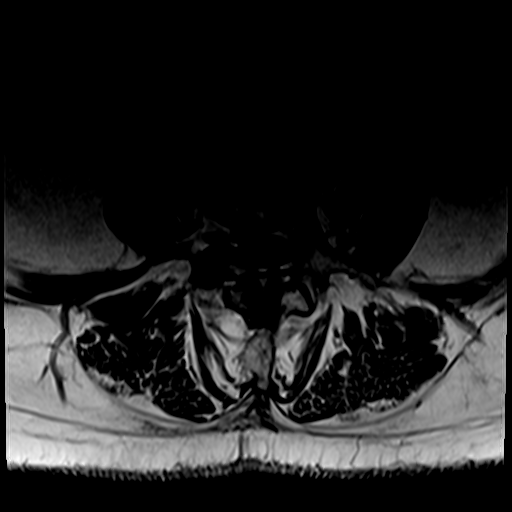
[im 17/40]
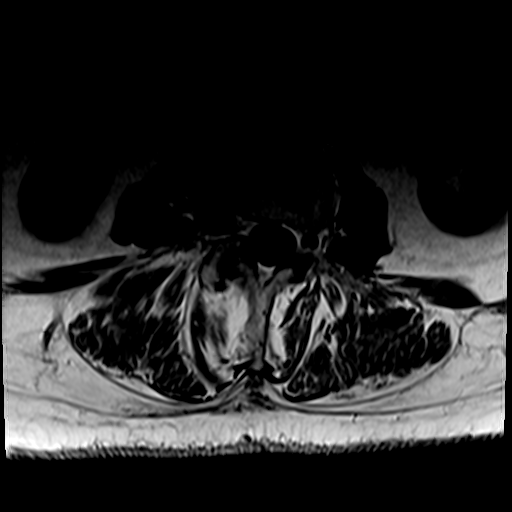
[im 20/40]
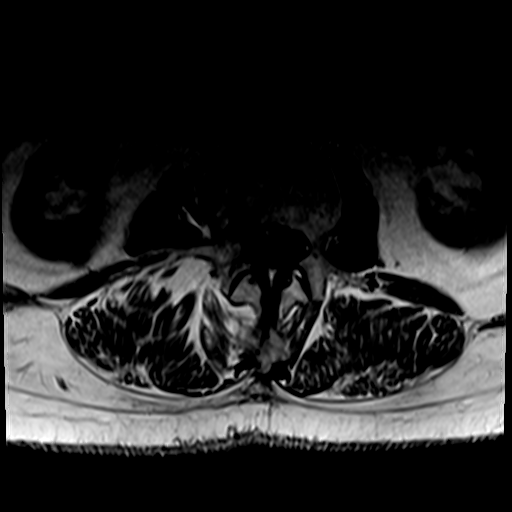
[im 23/40]
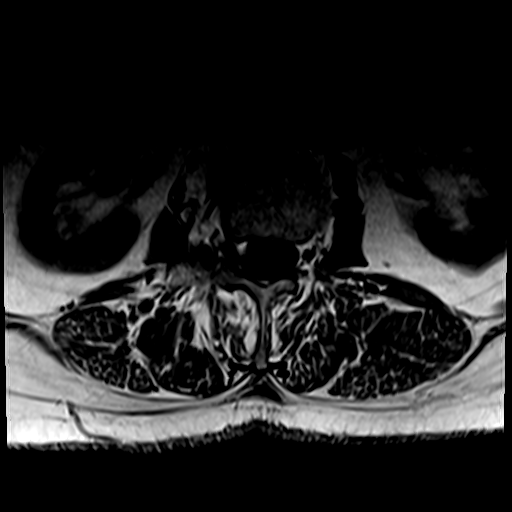
[im 28/40]
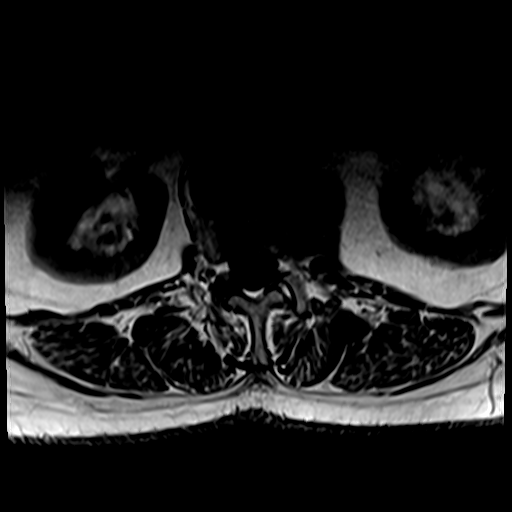
[im 34/40]
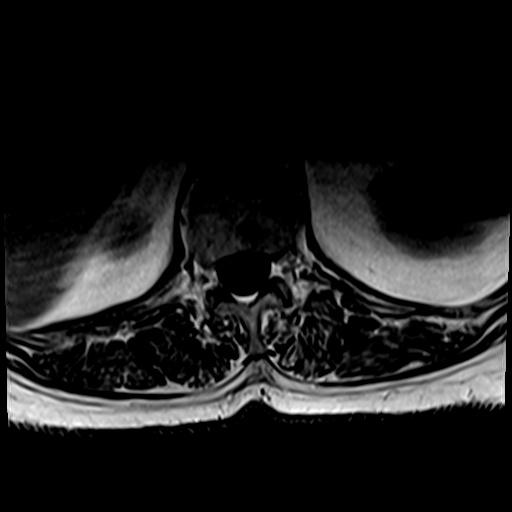
[im 40/40]
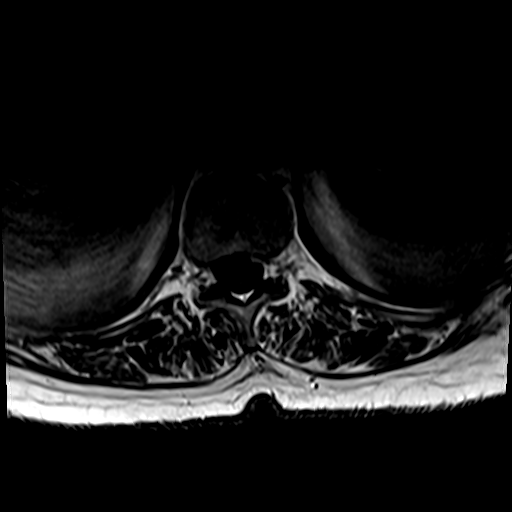

[30 of 48 positions shown; findings below may reference images not displayed]

FINDINGS: Segmentation:  Standard.

Alignment: Levoconvex scoliosis of the lumbar spine. A grade 1
anterolisthesis of L4 over L5, unchanged. Small retrolisthesis of L2
over L3.

Vertebrae: No fracture or evidence of discitis. T1 hypointense/T2
hyperintense lesions are seen within the sacrum bilaterally on
sagittal images, only partially evaluated. However, does appear
stable since prior MRI. Endplate degenerative changes throughout the
lumbar spine, more pronounced at L1-2 and L3-4.

Conus medullaris and cauda equina: Conus extends to the T12-L1
level. Conus and cauda equina appear normal.

Paraspinal and other soft tissues: Bilateral renal cysts

Disc levels:

T11-12: Left central posterior disc osteophyte complex causing
indentation of the thecal sac without significant spinal canal or
neural foraminal stenosis.

T12-L1: Left asymmetric disc bulge and mild facet degenerative
changes. No significant spinal canal or neural foraminal stenosis.

L1-2: Loss of disc height, disc bulge with associated osteophytic
component and superimposed small central disc extrusion migrating
inferiorly. There are also facet degenerative changes and ligamentum
flavum redundancy, findings result in narrowing of the right
subarticular zone, mild spinal canal stenosis and moderate bilateral
neural foraminal narrowing.

L2-3: Loss of disc height with partial disc space fusion, right
foraminal and extraforaminal disc protrusion with prominent
osteophytic component and superimposed ossified right central disc
extrusion. There are also facet degenerative changes and ligamentum
flavum redundancy. Findings result in mild spinal canal stenosis
with effacement of the right subarticular zone and severe right
neural foraminal narrowing.

L3-4: Loss of disc height, right asymmetric disc bulge with
associated osteophytic component and facet degenerative changes
resulting in mild spinal canal stenosis with narrowing of the
bilateral subarticular zones, moderate to severe right and moderate
left neural foraminal narrowing.

L4-5: Disc bulge with superimposed right central disc protrusion
migrating superiorly, prominent facet degenerative changes with
ligamentum flavum redundancy as well as inter spinous degeneration.
Findings result in severe spinal canal stenosis which appears mildly
progressed from prior MRI and mild bilateral neural foraminal
narrowing.

L5-S1: Left asymmetric disc bulge, facet degenerative changes
ligamentum flavum redundancy resulting in narrowing of the left
subarticular zone and moderate left neural foraminal narrowing.
IMPRESSION: 1. Multilevel degenerative changes of the lumbar spine as described
above, mildy progressed at L4-5 where there is severe spinal canal
stenosis and mild bilateral neural foraminal narrowing.
2. Mild spinal canal stenosis at L1-2, L2-3, and L3-4.
3. Severe right neural foraminal narrowing at L2-3 and moderate to
severe right neural foraminal narrowing at L3-4.
4. Moderate left neural foraminal narrowing at L5-S1.
5. Unchanged T1 hypointense/T2 hyperintense lesions within the
sacrum bilaterally, only partially evaluated.

These results will be called to the ordering clinician or
representative by the Radiologist Assistant, and communication
documented in the PACS or [REDACTED].

## 2021-12-13 ENCOUNTER — Other Ambulatory Visit: Payer: Self-pay | Admitting: Cardiovascular Disease

## 2021-12-13 DIAGNOSIS — I2510 Atherosclerotic heart disease of native coronary artery without angina pectoris: Secondary | ICD-10-CM

## 2021-12-14 DIAGNOSIS — M5136 Other intervertebral disc degeneration, lumbar region: Secondary | ICD-10-CM | POA: Diagnosis not present

## 2021-12-14 DIAGNOSIS — T451X5A Adverse effect of antineoplastic and immunosuppressive drugs, initial encounter: Secondary | ICD-10-CM | POA: Diagnosis not present

## 2021-12-14 DIAGNOSIS — G620 Drug-induced polyneuropathy: Secondary | ICD-10-CM | POA: Diagnosis not present

## 2021-12-14 DIAGNOSIS — M549 Dorsalgia, unspecified: Secondary | ICD-10-CM | POA: Diagnosis not present

## 2021-12-14 DIAGNOSIS — M47816 Spondylosis without myelopathy or radiculopathy, lumbar region: Secondary | ICD-10-CM | POA: Diagnosis not present

## 2022-01-17 DIAGNOSIS — M47816 Spondylosis without myelopathy or radiculopathy, lumbar region: Secondary | ICD-10-CM | POA: Diagnosis not present

## 2022-01-17 DIAGNOSIS — M5136 Other intervertebral disc degeneration, lumbar region: Secondary | ICD-10-CM | POA: Diagnosis not present

## 2022-02-02 DIAGNOSIS — M47816 Spondylosis without myelopathy or radiculopathy, lumbar region: Secondary | ICD-10-CM | POA: Diagnosis not present

## 2022-02-08 DIAGNOSIS — M5136 Other intervertebral disc degeneration, lumbar region: Secondary | ICD-10-CM | POA: Diagnosis not present

## 2022-02-08 DIAGNOSIS — M4807 Spinal stenosis, lumbosacral region: Secondary | ICD-10-CM | POA: Diagnosis not present

## 2022-02-08 DIAGNOSIS — M47816 Spondylosis without myelopathy or radiculopathy, lumbar region: Secondary | ICD-10-CM | POA: Diagnosis not present

## 2022-02-08 DIAGNOSIS — M48062 Spinal stenosis, lumbar region with neurogenic claudication: Secondary | ICD-10-CM | POA: Diagnosis not present

## 2022-02-08 DIAGNOSIS — M5416 Radiculopathy, lumbar region: Secondary | ICD-10-CM | POA: Diagnosis not present

## 2022-02-09 ENCOUNTER — Other Ambulatory Visit: Payer: Self-pay | Admitting: Family Medicine

## 2022-02-09 DIAGNOSIS — M5416 Radiculopathy, lumbar region: Secondary | ICD-10-CM

## 2022-03-02 ENCOUNTER — Other Ambulatory Visit: Payer: Medicare PPO

## 2022-03-02 ENCOUNTER — Ambulatory Visit
Admission: RE | Admit: 2022-03-02 | Discharge: 2022-03-02 | Disposition: A | Discharge status: Home / Self Care | Payer: Medicare PPO | Source: Ambulatory Visit | Attending: Family Medicine | Admitting: Family Medicine

## 2022-03-02 DIAGNOSIS — M4126 Other idiopathic scoliosis, lumbar region: Secondary | ICD-10-CM | POA: Diagnosis not present

## 2022-03-02 DIAGNOSIS — M47816 Spondylosis without myelopathy or radiculopathy, lumbar region: Secondary | ICD-10-CM | POA: Diagnosis not present

## 2022-03-02 DIAGNOSIS — M545 Low back pain, unspecified: Secondary | ICD-10-CM | POA: Diagnosis not present

## 2022-03-02 DIAGNOSIS — M4316 Spondylolisthesis, lumbar region: Secondary | ICD-10-CM | POA: Diagnosis not present

## 2022-03-02 DIAGNOSIS — M5416 Radiculopathy, lumbar region: Secondary | ICD-10-CM

## 2022-03-13 DIAGNOSIS — H353131 Nonexudative age-related macular degeneration, bilateral, early dry stage: Secondary | ICD-10-CM | POA: Diagnosis not present

## 2022-03-13 DIAGNOSIS — H524 Presbyopia: Secondary | ICD-10-CM | POA: Diagnosis not present

## 2022-03-28 DIAGNOSIS — M48062 Spinal stenosis, lumbar region with neurogenic claudication: Secondary | ICD-10-CM | POA: Diagnosis not present

## 2022-03-28 DIAGNOSIS — M5416 Radiculopathy, lumbar region: Secondary | ICD-10-CM | POA: Diagnosis not present

## 2022-04-17 DIAGNOSIS — G4733 Obstructive sleep apnea (adult) (pediatric): Secondary | ICD-10-CM | POA: Diagnosis not present

## 2022-04-18 DIAGNOSIS — M5136 Other intervertebral disc degeneration, lumbar region: Secondary | ICD-10-CM | POA: Diagnosis not present

## 2022-04-18 DIAGNOSIS — M47816 Spondylosis without myelopathy or radiculopathy, lumbar region: Secondary | ICD-10-CM | POA: Diagnosis not present

## 2022-04-18 DIAGNOSIS — M5416 Radiculopathy, lumbar region: Secondary | ICD-10-CM | POA: Diagnosis not present

## 2022-04-18 DIAGNOSIS — M48062 Spinal stenosis, lumbar region with neurogenic claudication: Secondary | ICD-10-CM | POA: Diagnosis not present

## 2022-04-27 DIAGNOSIS — M5416 Radiculopathy, lumbar region: Secondary | ICD-10-CM | POA: Diagnosis not present

## 2022-04-27 DIAGNOSIS — R262 Difficulty in walking, not elsewhere classified: Secondary | ICD-10-CM | POA: Diagnosis not present

## 2022-04-27 DIAGNOSIS — N39498 Other specified urinary incontinence: Secondary | ICD-10-CM | POA: Diagnosis not present

## 2022-05-01 DIAGNOSIS — R262 Difficulty in walking, not elsewhere classified: Secondary | ICD-10-CM | POA: Diagnosis not present

## 2022-05-01 DIAGNOSIS — M5416 Radiculopathy, lumbar region: Secondary | ICD-10-CM | POA: Diagnosis not present

## 2022-05-01 DIAGNOSIS — N39498 Other specified urinary incontinence: Secondary | ICD-10-CM | POA: Diagnosis not present

## 2022-05-03 DIAGNOSIS — R262 Difficulty in walking, not elsewhere classified: Secondary | ICD-10-CM | POA: Diagnosis not present

## 2022-05-03 DIAGNOSIS — M5416 Radiculopathy, lumbar region: Secondary | ICD-10-CM | POA: Diagnosis not present

## 2022-05-03 DIAGNOSIS — N39498 Other specified urinary incontinence: Secondary | ICD-10-CM | POA: Diagnosis not present

## 2022-05-08 DIAGNOSIS — M5416 Radiculopathy, lumbar region: Secondary | ICD-10-CM | POA: Diagnosis not present

## 2022-05-08 DIAGNOSIS — R262 Difficulty in walking, not elsewhere classified: Secondary | ICD-10-CM | POA: Diagnosis not present

## 2022-05-08 DIAGNOSIS — N39498 Other specified urinary incontinence: Secondary | ICD-10-CM | POA: Diagnosis not present

## 2022-05-10 DIAGNOSIS — M5416 Radiculopathy, lumbar region: Secondary | ICD-10-CM | POA: Diagnosis not present

## 2022-05-10 DIAGNOSIS — R262 Difficulty in walking, not elsewhere classified: Secondary | ICD-10-CM | POA: Diagnosis not present

## 2022-05-10 DIAGNOSIS — N39498 Other specified urinary incontinence: Secondary | ICD-10-CM | POA: Diagnosis not present

## 2022-05-15 DIAGNOSIS — N39498 Other specified urinary incontinence: Secondary | ICD-10-CM | POA: Diagnosis not present

## 2022-05-15 DIAGNOSIS — M5416 Radiculopathy, lumbar region: Secondary | ICD-10-CM | POA: Diagnosis not present

## 2022-05-15 DIAGNOSIS — R262 Difficulty in walking, not elsewhere classified: Secondary | ICD-10-CM | POA: Diagnosis not present

## 2022-05-17 DIAGNOSIS — M5416 Radiculopathy, lumbar region: Secondary | ICD-10-CM | POA: Diagnosis not present

## 2022-05-17 DIAGNOSIS — N39498 Other specified urinary incontinence: Secondary | ICD-10-CM | POA: Diagnosis not present

## 2022-05-17 DIAGNOSIS — R262 Difficulty in walking, not elsewhere classified: Secondary | ICD-10-CM | POA: Diagnosis not present

## 2022-05-18 DIAGNOSIS — G4733 Obstructive sleep apnea (adult) (pediatric): Secondary | ICD-10-CM | POA: Diagnosis not present

## 2022-05-22 DIAGNOSIS — M5416 Radiculopathy, lumbar region: Secondary | ICD-10-CM | POA: Diagnosis not present

## 2022-05-22 DIAGNOSIS — N39498 Other specified urinary incontinence: Secondary | ICD-10-CM | POA: Diagnosis not present

## 2022-05-22 DIAGNOSIS — R262 Difficulty in walking, not elsewhere classified: Secondary | ICD-10-CM | POA: Diagnosis not present

## 2022-05-24 DIAGNOSIS — N39498 Other specified urinary incontinence: Secondary | ICD-10-CM | POA: Diagnosis not present

## 2022-05-24 DIAGNOSIS — R262 Difficulty in walking, not elsewhere classified: Secondary | ICD-10-CM | POA: Diagnosis not present

## 2022-05-24 DIAGNOSIS — M5416 Radiculopathy, lumbar region: Secondary | ICD-10-CM | POA: Diagnosis not present

## 2022-05-25 DIAGNOSIS — L820 Inflamed seborrheic keratosis: Secondary | ICD-10-CM | POA: Diagnosis not present

## 2022-05-25 DIAGNOSIS — D2271 Melanocytic nevi of right lower limb, including hip: Secondary | ICD-10-CM | POA: Diagnosis not present

## 2022-05-25 DIAGNOSIS — X32XXXA Exposure to sunlight, initial encounter: Secondary | ICD-10-CM | POA: Diagnosis not present

## 2022-05-25 DIAGNOSIS — C44519 Basal cell carcinoma of skin of other part of trunk: Secondary | ICD-10-CM | POA: Diagnosis not present

## 2022-05-25 DIAGNOSIS — D2261 Melanocytic nevi of right upper limb, including shoulder: Secondary | ICD-10-CM | POA: Diagnosis not present

## 2022-05-25 DIAGNOSIS — D2262 Melanocytic nevi of left upper limb, including shoulder: Secondary | ICD-10-CM | POA: Diagnosis not present

## 2022-05-25 DIAGNOSIS — L821 Other seborrheic keratosis: Secondary | ICD-10-CM | POA: Diagnosis not present

## 2022-05-25 DIAGNOSIS — L570 Actinic keratosis: Secondary | ICD-10-CM | POA: Diagnosis not present

## 2022-05-25 DIAGNOSIS — D225 Melanocytic nevi of trunk: Secondary | ICD-10-CM | POA: Diagnosis not present

## 2022-05-25 DIAGNOSIS — D485 Neoplasm of uncertain behavior of skin: Secondary | ICD-10-CM | POA: Diagnosis not present

## 2022-05-25 DIAGNOSIS — D2272 Melanocytic nevi of left lower limb, including hip: Secondary | ICD-10-CM | POA: Diagnosis not present

## 2022-05-27 ENCOUNTER — Other Ambulatory Visit: Payer: Self-pay | Admitting: Cardiovascular Disease

## 2022-06-09 ENCOUNTER — Other Ambulatory Visit: Payer: Self-pay | Admitting: Cardiovascular Disease

## 2022-06-14 DIAGNOSIS — G4733 Obstructive sleep apnea (adult) (pediatric): Secondary | ICD-10-CM | POA: Diagnosis not present

## 2022-06-16 DIAGNOSIS — G4733 Obstructive sleep apnea (adult) (pediatric): Secondary | ICD-10-CM | POA: Diagnosis not present

## 2022-07-06 DIAGNOSIS — C44519 Basal cell carcinoma of skin of other part of trunk: Secondary | ICD-10-CM | POA: Diagnosis not present

## 2022-07-17 DIAGNOSIS — G4733 Obstructive sleep apnea (adult) (pediatric): Secondary | ICD-10-CM | POA: Diagnosis not present

## 2022-08-09 DIAGNOSIS — S76012A Strain of muscle, fascia and tendon of left hip, initial encounter: Secondary | ICD-10-CM | POA: Diagnosis not present

## 2022-08-09 DIAGNOSIS — M1711 Unilateral primary osteoarthritis, right knee: Secondary | ICD-10-CM | POA: Diagnosis not present

## 2022-08-16 DIAGNOSIS — G4733 Obstructive sleep apnea (adult) (pediatric): Secondary | ICD-10-CM | POA: Diagnosis not present

## 2022-08-30 ENCOUNTER — Other Ambulatory Visit: Payer: Self-pay

## 2022-08-30 ENCOUNTER — Other Ambulatory Visit: Payer: Self-pay | Admitting: Family Medicine

## 2022-08-30 DIAGNOSIS — Z1231 Encounter for screening mammogram for malignant neoplasm of breast: Secondary | ICD-10-CM

## 2022-08-30 DIAGNOSIS — C541 Malignant neoplasm of endometrium: Secondary | ICD-10-CM

## 2022-09-07 ENCOUNTER — Ambulatory Visit
Admission: RE | Admit: 2022-09-07 | Discharge: 2022-09-07 | Disposition: A | Discharge status: Home / Self Care | Payer: Medicare PPO | Source: Ambulatory Visit | Attending: Family Medicine | Admitting: Family Medicine

## 2022-09-07 DIAGNOSIS — Z1231 Encounter for screening mammogram for malignant neoplasm of breast: Secondary | ICD-10-CM | POA: Insufficient documentation

## 2022-09-08 DIAGNOSIS — M25412 Effusion, left shoulder: Secondary | ICD-10-CM | POA: Diagnosis not present

## 2022-09-16 DIAGNOSIS — G4733 Obstructive sleep apnea (adult) (pediatric): Secondary | ICD-10-CM | POA: Diagnosis not present

## 2022-09-18 ENCOUNTER — Inpatient Hospital Stay: Payer: Medicare PPO

## 2022-09-18 ENCOUNTER — Inpatient Hospital Stay: Payer: Medicare PPO | Attending: Nurse Practitioner | Admitting: Nurse Practitioner

## 2022-09-18 ENCOUNTER — Encounter: Payer: Self-pay | Admitting: Nurse Practitioner

## 2022-09-18 VITALS — BP 125/50 | HR 67 | Temp 96.5°F | Wt 204.0 lb

## 2022-09-18 DIAGNOSIS — N39498 Other specified urinary incontinence: Secondary | ICD-10-CM | POA: Insufficient documentation

## 2022-09-18 DIAGNOSIS — Z08 Encounter for follow-up examination after completed treatment for malignant neoplasm: Secondary | ICD-10-CM | POA: Diagnosis not present

## 2022-09-18 DIAGNOSIS — M5136 Other intervertebral disc degeneration, lumbar region: Secondary | ICD-10-CM | POA: Insufficient documentation

## 2022-09-18 DIAGNOSIS — Z8542 Personal history of malignant neoplasm of other parts of uterus: Secondary | ICD-10-CM | POA: Insufficient documentation

## 2022-09-18 DIAGNOSIS — Z923 Personal history of irradiation: Secondary | ICD-10-CM | POA: Diagnosis not present

## 2022-09-18 DIAGNOSIS — Z9221 Personal history of antineoplastic chemotherapy: Secondary | ICD-10-CM | POA: Insufficient documentation

## 2022-09-18 DIAGNOSIS — C541 Malignant neoplasm of endometrium: Secondary | ICD-10-CM

## 2022-09-18 NOTE — Progress Notes (Signed)
Gynecologic Oncology Interval Visit  [facility]  Telephone:(336) [phone] [phone]  Patient Care Team: Dorothey Baseman, MD as PCP - General (Family Medicine) Regan Lemming, MD as PCP - Electrophysiology (Cardiology) Lennette Bihari, MD as PCP - Cardiology (Cardiology) Lennette Bihari, MD as PCP - Sleep Medicine (Cardiology) Leida Lauth, MD as Referring Physician (Obstetrics and Gynecology)   Name of the patient: Susan [name]  [id number]  [birth year]   Date of visit: 09/18/2022  Oncology History:  Oncology History Overview Note  Endometrial cancer stage IIIc    Carcinoma of endometrium (HCC)  04/02/2015 Initial Diagnosis   Carcinoma of endometrium [facility])    Referring Provider: Dr Burnett Corrente  Chief Concern: stage IIIC1 serous endometrial cancer  Subjective:  Susan [name] is a 88 y.o. P46 female, initially seen in consultation from Dr. 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   She continues to have chronic urinary leakage. No improvement with myrbetriq or botox injections. She has chronic peripheral neuropathy and uses cane for ambulation. No vaginal bleeding, discharge, or pelvic pain. She continues to exercise at the [facility].   Mammogram was 09/11/22- negative    Gynecologic Oncology History  Susan [name] is a 88 y.o. 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  Please see complete details in prior notes.   Path report below shows stage IIIC1 disease.  Washings were negative, but she had 3 positive SLNs in pelvis.  An aortic SLN was negative.     A. Lymph node, sentinel,  right external iliac, excision:  Minute focus of metastatic carcinoma involving one lymph node (1/1). See note. Size of metastasis: Less than 0.1 mm. Negative for extranodal invasion.   Note: A cytokeratin AE1/3 highlights the metastatic focus.   B. Lymph node, sentinel, right external iliac #2, excision: Metastatic carcinoma involving one lymph node (1/1). See note. Size of metastasis: 0.5 mm. Negative for extranodal invasion. Note: A cytokeratin AE1/3 highlights the metastatic focus.   C. Lymph node, sentinel, right aortic, excision: One benign lymph node (0/1). Note: A cytokeratin AE1/3 immunostain is negative.    D. Lymph node, sentinel, left external iliac, excision:  Metastatic adenocarcinoma involving one lymph node (1/1).   Size of metastasis: 1 cm. Negative for extranodal invasion. Note: A cytokeratin AE1/3 highlights the metastatic focus.   E. 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  Tumor size: 5.1 cm. The tumor invades 2 mm into a 14 mm thick myometrium. Negative for cervical involvement. Lymphovascular invasion is present.   04/28/2015 CA125 82.5   Recommendation to proceed with with carboplatinum and paclitaxel three-four cycles followed by radiation therapy followed by additional 2-3 cycles of chemotherapy. She started on chemotherapy on 05/12/2015 and received 2nd cycle on 06/02/2015. She developed significant neuropathy impairing her quality of life with complaints of sensory ataxia, frequent falls, and needs to walk with the help of walker. Chemotherapy was discontinued. Then received 4500 cGy pelvic radiation and 1200 cGy using high dose rate remote afterloading through vaginal cylinder completed 7/17.  08/31/2016 Abdominal and pelvic CT scan revealed no evidence of recurrent disease.  There was a new sacral insufficiency  fracture.  She was referred to surgery.  Scans revealed a 3 mm pulmonary nodule.  Repeat chest CT in 3 months was recommended.  Pelvic MRI on 10/23/2016 revealed bilateral sacral insufficiency fractures with possible insufficiency fractures of the left greater than right bilateral iliac wings.  Lumbar spine MRI revealed diffuse degenerative disease.  Decision was made not to perform a sacral plasty.  She was seen by Dr. Lucy Chris on 11/07/2016.  Surgical decompression or fusion was not recommended. Recommendation was made to the pain clinic for treatment (medication, injections, or spinal cord stimulator). She is currently seeing a chiropractor and has had 6 treatments.  Chest CT on 12/05/2016 revealed scattered right lung nodules measuring up to 6 mm (mean diameter) in the right upper lobe along the minor fissure.  Metastatic disease was not excluded.  Recommendation was for follow-up CT in 3-6 months.   CA125 values:  04/28/2015  82.5  06/02/2015  22.0 07/14/2015  18.2 10/21/2015  11.7 01/21/2016  16.4 04/25/2016  14.2 08/15/2016  13.6 12/07/2016   16.2 03/08/2017  16.1 09/17/2019  14.9 12/15/2019  16.5 09/30/2020  14.7    03/06/2017 CT Chest WO Contrast: - she had a known h/o pulmonary nodules.. Findings stable tiny sub-cm right lung nodules   CT C/A/P 12/12/17- performed for back pain; stable pulmonary nodules. No evidence of metastatic or recurrent disease elsewhere. Aortic atherosclerosis. Degenerative changes in thoracolumbar spine. Mild sclerosis in sacrum.    Uro-Gyn History She had a prior history of urinary incontinence and was seen by Dr. Sherron Monday, Urology on 08/13/17. They discussed percutaneous tibial nerve stimulation.  02/26/2019-  continued bladder leakage. She was referred to [facility] after this visit.    07/14/2019- UroGynecology, Antony Contras, NP; unable to fit pessary d/t radiation changes. Follow up with Dr. Benna Dunks for urethral bulking was recommending. Continue to  be followed by Dr. MacDiarmid/urology. Receiving botox injections to bladder.   7/11/23Hilton Sinclair, PA- for UTI.     Problem List: Patient Active Problem List   Diagnosis Date Noted   Lumbar degenerative disc disease 10/22/2019   Lumbar facet arthropathy 09/04/2019   Chronic pain syndrome 09/04/2019   Difficulty walking 06/26/2019   Bilateral swelling of feet 11/19/2018   Low back pain radiating to both legs 11/19/2018   Numbness and tingling of both feet 11/19/2018   Chemotherapy-induced neuropathy (HCC) 04/17/2018   Neuropathy 04/17/2018   Chemotherapy-induced peripheral neuropathy (HCC) 03/01/2018   Persistent atrial fibrillation (HCC)    Port-A-Cath in place 03/28/2017   Bradycardia 01/18/2017   Obesity (BMI 30-39.9) 01/17/2017   Pulmonary nodules 12/05/2016   Compression fracture of L5 vertebra with delayed healing 11/01/2016   Sacral insufficiency fracture with delayed healing 11/01/2016   Sacral insufficiency fracture 09/05/2016   Absence of bladder continence 07/21/2015   Neuropathy associated with cancer (HCC) 06/02/2015   Carcinoma of endometrium (HCC) 04/02/2015   Gastro-esophageal reflux disease without esophagitis 02/15/2015   Osteopenia 08/17/2014   Hyperlipidemia 02/13/2014   Kidney stones 10/27/2013   CA skin, basal cell 06/30/2013   Spinal stenosis, lumbar region, with neurogenic claudication 06/30/2013   Arthritis, degenerative 06/30/2013   Neuralgia neuritis, sciatic nerve 06/30/2013   Coronary artery disease 10/25/2012   Dyslipidemia 10/25/2012   NSVT (nonsustained ventricular tachycardia) (HCC) 10/25/2012   Sick sinus syndrome (HCC) 10/25/2012   Ischemic cardiomyopathy 10/22/2012   Old anterior wall myocardial infarction 10/20/2012   Hypertensive heart disease without CHF 10/20/2012  Past Medical History: Past Medical History:  Diagnosis Date   Bradycardia    Chronic back pain    Coronary artery disease    a. s/p STEMI in 2014 with  DES to LAD with staged PCI/DES placement to LCx   Echoencephalogram abnormality    Heart murmur    History of Holter monitoring    Hyperlipidemia    Hypertension    Myocardial infarction (HCC)    Neuromuscular disorder (HCC)    neuropathy   NSVT (nonsustained ventricular tachycardia) (HCC)    Personal history of chemotherapy    ENDOMETRIAL CA   Personal history of radiation therapy    ENDOMETRIAL CA   Uterine cancer (HCC)    serrous    Past Surgical History: Past Surgical History:  Procedure Laterality Date   ABDOMINAL HYSTERECTOMY     BREAST EXCISIONAL BIOPSY Right 1956   NEG   CARDIOVERSION N/A 07/27/2017   Procedure: CARDIOVERSION;  Surgeon: Chilton Si, MD;  Location: [facility] ENDOSCOPY;  Service: Cardiovascular;  Laterality: N/A;   LEFT HEART CATHETERIZATION WITH CORONARY ANGIOGRAM  10/19/2012   Procedure: LEFT HEART CATHETERIZATION WITH CORONARY ANGIOGRAM;  Surgeon: Lennette Bihari, MD;  Location: [facility] CATH LAB;  Service: Cardiovascular;;   PERCUTANEOUS CORONARY STENT INTERVENTION (PCI-S)  10/19/2012   Procedure: PERCUTANEOUS CORONARY STENT INTERVENTION (PCI-S);  Surgeon: Lennette Bihari, MD;  Location[facility] CATH LAB;  Service: Cardiovascular;;   PERCUTANEOUS CORONARY STENT INTERVENTION (PCI-S) N/A 10/23/2012   Procedure: PERCUTANEOUS CORONARY STENT INTERVENTION (PCI-S);  Surgeon: Lennette Bihari, MD;  Location: [facility] CATH LAB;  Service: Cardiovascular;  Laterality: N/A;   PERIPHERAL VASCULAR CATHETERIZATION N/A 05/05/2015   Procedure: Shelda Pal Cath Insertion;  Surgeon: Annice Needy, MD;  Location: [facility] INVASIVE CV LAB;  Service: Cardiovascular;  Laterality: N/A;   PORTA CATH REMOVAL N/A 04/05/2017   Procedure: PORTA CATH REMOVAL;  Surgeon: Annice Needy, MD;  Location: [facility] INVASIVE CV LAB;  Service: Cardiovascular;  Laterality: N/A;    Family History: Family History  Problem Relation Age of Onset   Heart disease Mother    Breast cancer Mother 83   Heart disease Father    Diabetes Father     Diabetes Brother    Stroke Brother    Kidney cancer Neg Hx    Bladder Cancer Neg Hx     Social History: Social History   Socioeconomic History   Marital status: Widowed    Spouse name: Not on file   Number of children: Not on file   Years of education: Not on file   Highest education level: Not on file  Occupational History   Not on file  Tobacco Use   Smoking status: Former    Types: Cigarettes    Quit date: 56    Years since quitting: 48.5   Smokeless tobacco: Never  Vaping Use   Vaping Use: Never used  Substance and Sexual Activity   Alcohol use: Yes    Alcohol/week: 1.0 standard drink of alcohol    Types: 1 Glasses of wine per week   Drug use: No   Sexual activity: Not Currently  Other Topics Concern   Not on file  Social History Narrative   Not on file   Social Determinants of Health   Financial Resource Strain: Not on file  Food Insecurity: Not on file  Transportation Needs: Not on file  Physical Activity: Not on file  Stress: Not on file  Social Connections: Not on file  Intimate Partner Violence: Not on  file    Allergies: Allergies  Allergen Reactions   Ace Inhibitors Cough   Penicillins Rash and Other (See Comments)    Has patient had a PCN reaction causing immediate rash, facial/tongue/throat swelling, SOB or lightheadedness with hypotension: No Has patient had a PCN reaction causing severe rash involving mucus membranes or skin necrosis: No Has patient had a PCN reaction that required hospitalization: No Has patient had a PCN reaction occurring within the last 10 years: No If all of the above answers are "NO", then may proceed with Cephalosporin use.     Current Medications: Current Outpatient Medications  Medication Instructions   aspirin EC 81 mg, Daily   atorvastatin (LIPITOR) 80 MG tablet TAKE 1 TABLET BY MOUTH DAILY AT 6 PM.   cyanocobalamin (VITAMIN B12) 1,000 mcg, Oral, Daily   ELIQUIS 5 MG TABS tablet TAKE 1 TABLET BY MOUTH  TWICE A DAY   ezetimibe (ZETIA) 10 MG tablet TAKE 1 TABLET BY MOUTH EVERY DAY   fluticasone (FLONASE) 50 MCG/ACT nasal spray SPRAY 2 SPRAYS INTO EACH NOSTRIL EVERY DAY   isosorbide mononitrate (IMDUR) 30 MG 24 hr tablet TAKE 1 TABLET BY MOUTH EVERY DAY   loperamide (IMODIUM A-D) 2 mg, Oral, 4 times daily PRN   losartan (COZAAR) 25 MG tablet TAKE 2 TABLETS BY MOUTH EVERY DAY   Multiple Vitamin (MULTIVITAMIN WITH MINERALS) TABS 1 tablet, Daily   NITROSTAT 0.4 MG SL tablet PLACE 1 TABLET UNDER THE TOUGUE EVERY 5 MINUTES FOR UP TO 3 DOSES AS NEEDED FOR CHEST PAIN   omeprazole (PRILOSEC) 20 mg, Daily   vitamin C 1,000 mg, Oral, Daily, Taking prior to PNS procedure.    General: no complaints  HEENT: no complaints  Lungs: no complaints  Cardiac: no complaints  GI: no complaints  GU: Reports incontinence, urine leakage; on-going    Musculoskeletal: chronic back pain  Extremities: leg swelling   Skin: no complaints  Neuro: Reports persistent neuropathy  Endocrine: no complaints  Psych: no complaints      Objective:  Physical Examination:  BP (!) 125/50 (BP Location: Left Arm, Patient Position: Sitting)   Pulse 67   Temp (!) 96.5 F (35.8 C) (Tympanic)   Wt 204 lb (92.5 kg)   SpO2 99%   BMI 37.31 kg/m    ECOG Performance Status: 1 - Symptomatic but completely ambulatory  GENERAL: Patient is a older appearing female in no acute distress. She walks with a cane HEENT:  Sclera clear. Anicteric NODES:  Negative axillary, supraclavicular, inguinal lymph node survery LUNGS:  Clear to auscultation bilaterally.   HEART:  Regular rate and rhythm.  ABDOMEN:  Soft, nontender.  No hernias, incisions well healed. No masses or ascites EXTREMITIES:  Bilateral peripheral edema. Atraumatic. No cyanosis SKIN:  Clear with no obvious rashes or skin changes.  NEURO:  Nonfocal. Well oriented.  Appropriate affect.  PELVIC:  Urethra: normal appearing; Urinary leakage.   Vulva: normal appearing vulva  with no masses, tenderness or lesions Vagina: atrophic. Vaginal shortening 4-5cm. Cuff visualized. Bladder prolapse. No bleeding, discharge, or lesions. BME: no masses. Uterus, cervix: surgically absent. Rectal: deferred.    Assessment:  HISAKO BUGH is a 88 y.o. 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  CT scan 9/19 normal and no change in indeterminate  lung lesions. NED since. She is no longer followed by medical oncology or radiation oncology.   Urinary incontinence - related to radiation and post operative changes. Managed by UroGyn at [facility] and Urology/Dr. Sherron Monday. S/p botox injections one week ago with no improvement.   Peripheral neuropathy- likely due to taxane chemotherapy. Chronic   Back pain: Previous imaging revealed DDD. Chronic & unchanged.    Plan:  Continue surveillance. CA 125 pending today but is Teacher, English as a foreign language for her and we will monitor annually. NCCN guidelines for surveillance reviewed. RTC in 1 year for continued surveillance and pelvic exam. Sooner if she has concerning symptoms.   Screening mammograms and other health screenings to be scheduled by Dr. Terance Hart, patient's pcp.   Urinary incontinence- continue follow up with uro-gyn and urology  CIPN- Continue follow up with Dr. Potter/Neurology as needed.    RTC in 1 year for continued surveillance.   Consuello Masse, DNP, AGNP-C [facility] [phone] (clinic)

## 2022-09-19 LAB — CA 125: Cancer Antigen (CA) 125: 17.9 U/mL (ref 0.0–38.1)

## 2022-09-20 ENCOUNTER — Ambulatory Visit: Payer: Medicare PPO

## 2022-09-20 NOTE — Addendum Note (Signed)
Addended by: Alinda Deem H on: 09/20/2022 02:46 PM   Modules accepted: Orders

## 2022-09-25 ENCOUNTER — Other Ambulatory Visit: Payer: Self-pay | Admitting: Cardiovascular Disease

## 2022-09-25 DIAGNOSIS — I4819 Other persistent atrial fibrillation: Secondary | ICD-10-CM

## 2022-10-04 DIAGNOSIS — D1722 Benign lipomatous neoplasm of skin and subcutaneous tissue of left arm: Secondary | ICD-10-CM | POA: Diagnosis not present

## 2022-10-04 DIAGNOSIS — M25512 Pain in left shoulder: Secondary | ICD-10-CM | POA: Diagnosis not present

## 2022-10-04 DIAGNOSIS — M7542 Impingement syndrome of left shoulder: Secondary | ICD-10-CM | POA: Diagnosis not present

## 2022-10-04 DIAGNOSIS — M778 Other enthesopathies, not elsewhere classified: Secondary | ICD-10-CM | POA: Diagnosis not present

## 2022-10-04 DIAGNOSIS — G8929 Other chronic pain: Secondary | ICD-10-CM | POA: Diagnosis not present

## 2022-10-18 ENCOUNTER — Ambulatory Visit: Payer: Medicare PPO | Admitting: Cardiovascular Disease

## 2022-10-18 DIAGNOSIS — G4733 Obstructive sleep apnea (adult) (pediatric): Secondary | ICD-10-CM | POA: Diagnosis not present

## 2022-10-28 ENCOUNTER — Other Ambulatory Visit: Payer: Self-pay | Admitting: Cardiovascular Disease

## 2022-10-28 DIAGNOSIS — I4819 Other persistent atrial fibrillation: Secondary | ICD-10-CM

## 2022-10-30 NOTE — Telephone Encounter (Signed)
Prescription refill request for Eliquis received. Indication:afib Last office visit:upcoming GMW:NUUVO labs Age: 88 Weight:92.5  kg  Prescription refilled

## 2022-10-31 DIAGNOSIS — M1990 Unspecified osteoarthritis, unspecified site: Secondary | ICD-10-CM | POA: Diagnosis not present

## 2022-10-31 DIAGNOSIS — I700 Atherosclerosis of aorta: Secondary | ICD-10-CM | POA: Diagnosis not present

## 2022-10-31 DIAGNOSIS — Z87891 Personal history of nicotine dependence: Secondary | ICD-10-CM | POA: Diagnosis not present

## 2022-10-31 DIAGNOSIS — Z8249 Family history of ischemic heart disease and other diseases of the circulatory system: Secondary | ICD-10-CM | POA: Diagnosis not present

## 2022-10-31 DIAGNOSIS — M810 Age-related osteoporosis without current pathological fracture: Secondary | ICD-10-CM | POA: Diagnosis not present

## 2022-10-31 DIAGNOSIS — I10 Essential (primary) hypertension: Secondary | ICD-10-CM | POA: Diagnosis not present

## 2022-10-31 DIAGNOSIS — E785 Hyperlipidemia, unspecified: Secondary | ICD-10-CM | POA: Diagnosis not present

## 2022-10-31 DIAGNOSIS — I252 Old myocardial infarction: Secondary | ICD-10-CM | POA: Diagnosis not present

## 2022-10-31 DIAGNOSIS — R609 Edema, unspecified: Secondary | ICD-10-CM | POA: Diagnosis not present

## 2022-10-31 DIAGNOSIS — Z79899 Other long term (current) drug therapy: Secondary | ICD-10-CM | POA: Diagnosis not present

## 2022-10-31 DIAGNOSIS — D6869 Other thrombophilia: Secondary | ICD-10-CM | POA: Diagnosis not present

## 2022-10-31 DIAGNOSIS — R011 Cardiac murmur, unspecified: Secondary | ICD-10-CM | POA: Diagnosis not present

## 2022-10-31 DIAGNOSIS — I739 Peripheral vascular disease, unspecified: Secondary | ICD-10-CM | POA: Diagnosis not present

## 2022-10-31 DIAGNOSIS — J302 Other seasonal allergic rhinitis: Secondary | ICD-10-CM | POA: Diagnosis not present

## 2022-10-31 DIAGNOSIS — R32 Unspecified urinary incontinence: Secondary | ICD-10-CM | POA: Diagnosis not present

## 2022-10-31 DIAGNOSIS — I255 Ischemic cardiomyopathy: Secondary | ICD-10-CM | POA: Diagnosis not present

## 2022-10-31 DIAGNOSIS — G4733 Obstructive sleep apnea (adult) (pediatric): Secondary | ICD-10-CM | POA: Diagnosis not present

## 2022-10-31 DIAGNOSIS — I495 Sick sinus syndrome: Secondary | ICD-10-CM | POA: Diagnosis not present

## 2022-11-13 ENCOUNTER — Ambulatory Visit: Payer: Medicare PPO | Admitting: Cardiovascular Disease

## 2022-11-13 DIAGNOSIS — G4733 Obstructive sleep apnea (adult) (pediatric): Secondary | ICD-10-CM

## 2022-11-24 ENCOUNTER — Other Ambulatory Visit: Payer: Self-pay | Admitting: Cardiovascular Disease

## 2022-11-24 DIAGNOSIS — I2510 Atherosclerotic heart disease of native coronary artery without angina pectoris: Secondary | ICD-10-CM

## 2022-12-16 ENCOUNTER — Other Ambulatory Visit: Payer: Self-pay | Admitting: Cardiovascular Disease

## 2022-12-22 ENCOUNTER — Ambulatory Visit: Payer: Medicare PPO | Admitting: Student

## 2023-01-04 ENCOUNTER — Encounter: Payer: Self-pay | Admitting: Cardiovascular Disease

## 2023-01-04 ENCOUNTER — Ambulatory Visit: Payer: Medicare PPO | Attending: Cardiovascular Disease | Admitting: Cardiovascular Disease

## 2023-01-04 DIAGNOSIS — I4821 Permanent atrial fibrillation: Secondary | ICD-10-CM | POA: Diagnosis not present

## 2023-01-04 DIAGNOSIS — I2510 Atherosclerotic heart disease of native coronary artery without angina pectoris: Secondary | ICD-10-CM | POA: Diagnosis not present

## 2023-01-04 DIAGNOSIS — I255 Ischemic cardiomyopathy: Secondary | ICD-10-CM

## 2023-01-04 DIAGNOSIS — G4733 Obstructive sleep apnea (adult) (pediatric): Secondary | ICD-10-CM

## 2023-01-04 DIAGNOSIS — Z7901 Long term (current) use of anticoagulants: Secondary | ICD-10-CM | POA: Diagnosis not present

## 2023-01-04 DIAGNOSIS — I252 Old myocardial infarction: Secondary | ICD-10-CM

## 2023-01-04 DIAGNOSIS — E785 Hyperlipidemia, unspecified: Secondary | ICD-10-CM

## 2023-01-04 NOTE — Progress Notes (Signed)
Patient ID: Susan [name], female   DOB: [birth year], 88 y.o.   MRN: [id number]       HPI: Susan [name] is a 88 y.o. female who presents to the office today for a 10 month follow-up cardiology evaluation.  Susan [name] is originally from [place], [place] and now resides in [place], [place]. On 10/19/2012 she presented [facility] in transfer from [facility] with an anterior wall ST segment elevation myocardial infarction. She had chest pain of greater than 12 hours duration. I performed emergent cardiac catheterization  where her LAD was found to be totally occluded.  She underwent stenting of her LAD extending from the ostium to mid region with 3.0x38 mm and 3.0x16 mm Promus Premier DES stents. Initially she had significant wall motion abnormality involving the LAD territory. She also was found to have a 95% mid AV groove circumflex stenosis.  On 10/23/2012 she underwent successful staged intervention with insertion of a 2.25x12 mm Promus premier DES stent into the circumflex vessel. Subsequently she had done well. She did develop some hypotension leading to reduction in some of her medications.  She was enrolled in the [facility] cardiac rehabilitation program. An exercise Myoview study on 12/03/2012 demonstrated significant salvage of myocardium with only a small area of distal antero-apical, apical, septal and  apical scar without evidence for ischemia. Post-rest ejection fraction was 66% and there was evidence for apical akinesis. Laboratory revealed an increased LDL particle number at 1352 despite being on atorvastatin 80 mg. She was contacted and Zetia 10 mg was just added to this atorvastatin dose. She did have 735 small LDL particles and a calculated LDL of 90 with HDL 40 and triglycerides 119.  She had developed hematuria and was found to have left kidney stones.  She underwent lithotripsy for her multiple kidney stones. A nuclear perfusion study prior to potential  discontinuance of dual anti-platelet therapy on 11/05/2013  was interpreted as low risk, but did show extensive scar in the distal LAD to return distribution.  Post-rest ejection fraction was 40%.  There was evidence for apical akinesis/mild dyskinesis.  She has been documented to have episodes of nonsustained ventricular tachycardia and has seen Dr. Elberta Fortis..  Her bradycardia has been more profound while sleeping.  She was able to exercise and walk without limitation other than back discomfort.  She was evaluated for possible indication for pacemaker was not felt that a pacemaker is indicated presently.  Because of her bradycardia she has not been on beta blocker therapy.  She has had episodes of nonsustained VT noted on a CardioNet monitor.  When I  saw her she admitted that her sleep was poor and nonrestorative.  She snored loudly and required frequent daytime naps.  He was sleeping at least 8 hours per night, but poor quality.  She underwent a split-night protocol sleep study on 03/08/2015 and was found to have severe sleep apnea with an AHI of 35.8 overall and more severe with REM sleep with an AHI of 67.5 per hour.  She had significant oxygen desaturation to a nadir of 77%.  There was loud snoring.  I recommended tai chi CPAP therapy at 9 cm water pressure.  Since initiating CPAP therapy.  She has felt well.  She is no longer having to take naps.  She is using [employer] for DME company.  When I saw her one year ago, she was meeting Medicare compliance. A download was obtained from 05/01/2015 through 05/30/2015 with usage stays at 93%  and 70% of days greater than 4 hours.  Her AHI was excellent at 1.9 with a set pressure of 9 cm.    She was diagnosed with endometrial cancer and underwent a total hysterectomy.  She also has a Port-A-Cath that had undergone chemotherapy and also completed 25 radiation remains to her body and 3 intravaginally..  She has developed a neuropathy for which he takes gabapentin.  He  is unaware of chest pain or palpitations.    When I saw her in 2018 she continued to use CPAP with 100% compliance.  A download from 06/12/2016 through 06/13/2016 100% of usage stays.  She has a ResMed AirSense10 AutoSet unit set at a pressure of 9 cm.  Her AHI is 1.1.  She is using a Radiographer, therapeutic &Paykel Eson nasal mask.  There is no leak.    Since I  saw her in March 2018, she has done well from a cardiac standpoint.  She denies chest pain, PND, orthopnea, or palpitations.  She continues to teach water aerobics.  However, she has continued to have issues as result of her radiation and chemotherapy for her endometrial CA.  She has developed a neuropathy and as result, has been walking with a cane or a walker.  Her gabapentin dose has been increased to 3 times per day.  She also has a small fracture in her sacrum.  She underwent initial evaluation at [facility] for possible kyphoplasty, but ultimately this was not done.  She also has degenerative disc disease in L3-L5 for which she has been seen pain management.  She has undergone injection.  She we having an additional injection next week and will need to hold Brilinta.  She also has had radiation-induced urinary incontinence.  A recent CT has demonstrated 5 small nodules in her long and she will be undergoing a follow-up CT in December.  When I saw her in October 2018 a CPAP download  from 11/27/2016 -12/26/2016 confirmed 100% compliance . AHI was1.2 at her 9 cm water pressure.  She was averaging 7 hours and 57 minutes of sleep per night.  An Epworth Sleepiness Scale score was 1.  She subsequently was found to have significant bradycardia on event monitor.  She had heart rates down to the mid to low 30s.  There were 2 episodes where her heart rate dropped to 25 bpm and then 19 bpm.  She was not on any AV nodal blocking drugs.  I recommended that she go to the emergency room for evaluation.  She was admitted overnight.  She was evaluated by Dr. Elberta Fortis the following  day.  He reviewed her Holter monitor.  At that time, no AF was appreciated but there were periods of Mobitz 1 block, rates in the 20s with pauses and junctional escape beats.  Her telemetry on that particular day was showing sinus bradycardia in the 40s-50s.  The patient admitted that she was entirely asymptomatic.  At that point the decision was made not to place a permanent pacemaker.  She subsequently saw [facility] in office follow-up in January 2019.  When I saw her in March 2019 she was much more tired and sleepy.  She was not using CPAP since she was coughing.  She also was using Mucinex DM for her chest congestion.  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  She has issues with back discomfort.  During that evaluation, her ECG showed that she was in atrial fibrillation at  67 bpm with left bundle branch block with a rates variable ranging from low 40s to approximately 88.  An echo Doppler study on June 27, 2017 showed an EF of 40 to 45%.  There was mild aortic stenosis with a valve area estimated 1.82 cm.  She had moderate left atrial dilatation.  There was mild pulmonary hypertension with a PA peak pressure 36 mm.    She saw Dr. Elberta Fortis for cardiology/EP follow-up on July 17, 2017.  At that time she was not having any episodes of significant dizziness, presyncope or syncope.  They discussed an attempt at trying to get her back into sinus rhythm and she has been on Eliquis.  She underwent an outpatient successful cardioversion on Jul 27, 2017 for restoration of sinus rhythm at 1 shock at 150 J.   I  saw her in June 2019 at which time she had issues with urinary incontinence as result of damage from radiation treatment.  She admits to 1-2+ ankle swelling.  She is unaware of her cardiac rhythm.  She is now back on CPAP with 100% compliance and is no longer coughing.    She has undergone recent evaluation with Dr. Elberta Fortis on April 17, 2019.  Her ECG showed atrial fibrillation with left bundle branch block.  She denies episodes of palpitations, chest pain shortness of breath orthopnea PND, dizziness presyncope or syncope.  Unfortunately, her husband fell in January and injured his hip.  Following a short hospitalization he was advised that he goes to a rehab center for physical therapy.  Unfortunately he contracted Covid during that time, subsequently was transferred to [facility] and unfortunately died on 05-22-2019 after being on a ventilator with COVID-19 infection.  I saw her in March 2021.  At that time she was having significant difficulty with the recent death of her husband from COVID.  She denied any chest pain PND orthopnea or palpitations.  She is now living by herself.  She continued to use CPAP therapy.  Her insurance has changed and she will need to change her provider which [facility] [place] in [place].  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   I last saw her on June 08, 2019 at which time he was having issues with neuropathy and  bladder problems from her radiation treatment.  She also recently underwent Botox administration to her bladder.  She denies any recurrent anginal symptoms or shortness of breath.  She is unaware of palpitations.  She has continued to be on isosorbide 30 mg and losartan 50 mg daily.  She is on Eliquis for anticoagulation.  She continues to be on atorvastatin 80 mg and Zetia 10 mg for hyperlipidemia.  She is followed by Dr. Terance Hart at the [facility] in [place] for primary care.  She continues to use CPAP with 100% compliance.  I obtained a download from February 12 through June 06, 2020 which shows 100% usage with average use at 6 hours and 48 minutes.  At 9 cm set pressure, AHI is excellent at 1.2.   I last saw her on October 27, 2021.  Since her prior evaluation she has been wearing a left foot  brace due to a dropfoot.  She has noticed leg swelling right greater than left.  She has continued to use her CPAP machine, but her unit is no longer wireless with her initial set up date in December  2016.  She brought her machine in today so that we could access data with a card.  She continues to be compliant on a download from December 8 through April 01, 2021 with average use at 6 hours and 26 minutes.  At 9 cm set pressure AHI is 1.3.  She continues to have issues with her neuropathy.  She denied chest pain or palpitations.  He received a new ResMed AirSense 11 AutoSet unit on September 15, 2021.  She is now using [employer] as her DME company.  Last saw her, she has continued to feel well.  She remains active and does water aerobics on Monday Wednesday and Friday.  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  She has neuropathy and continues to have foot drop of her left foot.  Outside the house, she walks with a cane.  She continues to be on Eliquis for anticoagulation with her atrial fibrillation.  She is on isosorbide 30 mg and losartan 25 mg daily for hypertension and her CAD.  She is on atorvastatin 80 mg and Zetia 10 mg for hyperlipidemia.  She takes omeprazole daily as well as Flonase spray in her nostrils.  She is sleeping well.  She denies any residual daytime sleepiness.  A download was obtained in the office today from September 4 through December 28, 2022 which shows 100% usage of her CPAP.  Her CPAP is set at 9 cm set pressure.  AHI is excellent at 1.1.  She presents for evaluation.     Past Medical History:  Diagnosis Date   Bradycardia    Chronic back pain    Coronary artery disease    a. s/p STEMI in 2014 with DES to LAD with staged PCI/DES placement to LCx   Echoencephalogram abnormality    Heart murmur    History of Holter monitoring    Hyperlipidemia    Hypertension    Myocardial infarction (HCC)    Neuromuscular disorder (HCC)    neuropathy   NSVT (nonsustained  ventricular tachycardia) (HCC)    Personal history of chemotherapy    ENDOMETRIAL CA   Personal history of radiation therapy    ENDOMETRIAL CA   Uterine cancer (HCC)    serrous    Past Surgical History:  Procedure Laterality Date   ABDOMINAL HYSTERECTOMY     BREAST EXCISIONAL BIOPSY Right 1956   NEG   CARDIOVERSION N/A 07/27/2017   Procedure: CARDIOVERSION;  Surgeon: Chilton Si, MD;  Location: [facility] ENDOSCOPY;  Service: Cardiovascular;  Laterality: N/A;   LEFT HEART CATHETERIZATION WITH CORONARY ANGIOGRAM  10/19/2012   Procedure: LEFT HEART CATHETERIZATION WITH CORONARY ANGIOGRAM;  Surgeon: Lennette Bihari, MD;  Location: [facility] CATH LAB;  Service: Cardiovascular;;   PERCUTANEOUS CORONARY STENT INTERVENTION (PCI-S)  10/19/2012   Procedure: PERCUTANEOUS CORONARY STENT INTERVENTION (PCI-S);  Surgeon: Lennette Bihari, MD;  Location: [facility] CATH LAB;  Service: Cardiovascular;;   PERCUTANEOUS CORONARY STENT INTERVENTION (PCI-S) N/A 10/23/2012   Procedure: PERCUTANEOUS CORONARY STENT INTERVENTION (PCI-S);  Surgeon: Lennette Bihari, MD;  Location: [facility] CATH LAB;  Service: Cardiovascular;  Laterality: N/A;   PERIPHERAL VASCULAR CATHETERIZATION N/A 05/05/2015   Procedure: Shelda Pal Cath Insertion;  Surgeon: Annice Needy, MD;  Location: [facility] INVASIVE CV LAB;  Service: Cardiovascular;  Laterality: N/A;   PORTA CATH REMOVAL N/A 04/05/2017   Procedure: PORTA CATH REMOVAL;  Surgeon: Annice Needy, MD;  Location: [facility] INVASIVE CV LAB;  Service: Cardiovascular;  Laterality: N/A;    Allergies  Allergen  Reactions   Ace Inhibitors Cough   Penicillins Rash and Other (See Comments)    Has patient had a PCN reaction causing immediate rash, facial/tongue/throat swelling, SOB or lightheadedness with hypotension: No Has patient had a PCN reaction causing severe rash involving mucus membranes or skin necrosis: No Has patient had a PCN reaction that required hospitalization: No Has patient had a PCN reaction occurring within the  last 10 years: No If all of the above answers are "NO", then may proceed with Cephalosporin use.     Current Outpatient Medications  Medication Sig Dispense Refill   alendronate (FOSAMAX) 70 MG tablet Take 70 mg by mouth once a week.     apixaban (ELIQUIS) 5 MG TABS tablet Take 1 tablet (5 mg total) by mouth 2 (two) times daily. Needs labs for Eliquis refills at next cardiology appt 60 tablet 0   aspirin EC 81 MG tablet Take 81 mg by mouth daily.     atorvastatin (LIPITOR) 80 MG tablet TAKE 1 TABLET BY MOUTH DAILY AT 6 PM 30 tablet 0   ezetimibe (ZETIA) 10 MG tablet TAKE 1 TABLET BY MOUTH EVERY DAY 90 tablet 3   fluticasone (FLONASE) 50 MCG/ACT nasal spray SPRAY 2 SPRAYS INTO EACH NOSTRIL EVERY DAY     isosorbide mononitrate (IMDUR) 30 MG 24 hr tablet TAKE 1 TABLET BY MOUTH EVERY DAY 30 tablet 0   loperamide (IMODIUM A-D) 2 MG tablet Take 2 mg by mouth 4 (four) times daily as needed for diarrhea or loose stools.      losartan (COZAAR) 25 MG tablet Take 2 tablets (50 mg total) by mouth daily. Please call office to schedule an appt for further refills. Thank you 180 tablet 0   Multiple Vitamin (MULTIVITAMIN WITH MINERALS) TABS Take 1 tablet by mouth daily.     NITROSTAT 0.4 MG SL tablet PLACE 1 TABLET UNDER THE TOUGUE EVERY 5 MINUTES FOR UP TO 3 DOSES AS NEEDED FOR CHEST PAIN 25 tablet 0   omeprazole (PRILOSEC) 20 MG capsule Take 20 mg by mouth daily.     Ascorbic Acid (VITAMIN C) 1000 MG tablet Take 1,000 mg by mouth daily. Taking prior to PNS procedure.     vitamin B-12 (CYANOCOBALAMIN) 1000 MCG tablet Take 1,000 mcg by mouth daily.     No current facility-administered medications for this visit.   Facility-Administered Medications Ordered in Other Visits  Medication Dose Route Frequency Provider Last Rate Last Admin   sodium chloride flush (NS) 0.9 % injection 10 mL  10 mL Intravenous PRN Louretta Shorten R, MD   10 mL at 06/06/16 1447   sodium chloride flush (NS) 0.9 % injection 10 mL   10 mL Intravenous PRN Earna Coder, MD   10 mL at 08/15/16 1404    Social History   Socioeconomic History   Marital status: Widowed    Spouse name: Not on file   Number of children: Not on file   Years of education: Not on file   Highest education level: Not on file  Occupational History   Not on file  Tobacco Use   Smoking status: Former    Current packs/day: 0.00    Types: Cigarettes    Quit date: 42    Years since quitting: 48.8   Smokeless tobacco: Never  Vaping Use   Vaping status: Never Used  Substance and Sexual Activity   Alcohol use: Yes    Alcohol/week: 1.0 standard drink of alcohol    Types: 1  Glasses of wine per week   Drug use: No   Sexual activity: Not Currently  Other Topics Concern   Not on file  Social History Narrative   Not on file   Social Determinants of Health   Financial Resource Strain: Not on file  Food Insecurity: Not on file  Transportation Needs: Not on file  Physical Activity: Not on file  Stress: Not on file  Social Connections: Not on file  Intimate Partner Violence: Not on file    Socially she is married. There is no tobacco use. She drinks alcohol rarely.  Family History  Problem Relation Age of Onset   Heart disease Mother    Breast cancer Mother 63   Heart disease Father    Diabetes Father    Diabetes Brother    Stroke Brother    Kidney cancer Neg Hx    Bladder Cancer Neg Hx     ROS General: Negative; No fevers, chills, or night sweats;  HEENT: Negative; No changes in vision or hearing, sinus congestion, difficulty swallowing Pulmonary: Negative; No cough, wheezing, shortness of breath, hemoptysis Cardiovascular:  See HPI GI: Positive for GERD; No nausea, vomiting, diarrhea, or abdominal pain [id number] of previous kidney stones, contributing to left flank pain intermittently; No dysuria, hematuria, or difficulty voiding; radiation-induced urinary incontinence Musculoskeletal: Positive for small sacral  fracture, L3-L5 degenerative disc disease. Hematologic/Oncology: Positive for endometrial cancer, status post chemotherapy and radiation treatments. Endocrine: Negative; no heat/cold intolerance; no diabetes Neuro: Neuropathy, left foot drop Skin: Negative; No rashes or skin lesions Psychiatric: Negative; No behavioral problems, depression Sleep: Positive for obstructive sleep apnea, on CPAP therapy.  No breakthrough snoring.  No daytime sleepiness. Other comprehensive 14 point system review is negative.   PE BP 124/82   Pulse (!) 58   Ht 5\' 2"  (1.575 m)   Wt 205 lb 9.6 oz (93.3 kg)   SpO2 95%   BMI 37.60 kg/m    Repeat blood pressure by me was 122/70  Wt Readings from Last 3 Encounters:  01/04/23 205 lb 9.6 oz (93.3 kg)  09/18/22 204 lb (92.5 kg)  10/27/21 212 lb 6.4 oz (96.3 kg)   General: Alert, oriented, no distress.  Skin: normal turgor, no rashes, warm and dry HEENT: Normocephalic, atraumatic. Pupils equal round and reactive to light; sclera anicteric; extraocular muscles intact;  Nose without nasal septal hypertrophy Mouth/Parynx benign; Mallinpatti scale 3 Neck: No JVD, no carotid bruits; normal carotid upstroke Lungs: clear to ausculatation and percussion; no wheezing or rales Chest wall: without tenderness to palpitation Heart: PMI not displaced, RRR, s1 s2 normal, 1/6 systolic murmur, no diastolic murmur, no rubs, gallops, thrills, or heaves Abdomen: soft, nontender; no hepatosplenomehaly, BS+; abdominal aorta nontender and not dilated by palpation. Back: no CVA tenderness Pulses 2+ Musculoskeletal: full range of motion, normal strength, no joint deformities Extremities: Minimal residual leg swelling, wearing support stockings.  No clubbing cyanosis or , Homan's sign negative  Neurologic: grossly nonfocal; Cranial nerves grossly wnl Psychologic: Normal mood and affect EKG Interpretation Date/Time:  Thursday January 04 2023 12:02:09 EDT Ventricular Rate:  58 PR  Interval:    QRS Duration:  140 QT Interval:  436 QTC Calculation: 428 R Axis:   92  Text Interpretation: Atrial fibrillation with slow ventricular response Rightward axis Non-specific intra-ventricular conduction block Cannot rule out Septal infarct (cited on or before 04-Jan-2023) T wave abnormality, consider inferior ischemia T wave abnormality, consider anterolateral ischemia When compared with ECG of 10-Oct-2017 10:40, Inverted T  waves have replaced nonspecific T wave abnormality in Anterior leads Confirmed by Nicki Guadalajara ([id number]) on 01/04/2023 12:11:59 PM    April 01, 2021 ECG (independently read by me):  Atrial fibrillation at 56, LBBB  March 2021 ECG (independently read by me): Atrial fibrillation at 70; LBBB, QTc 453  June 2019 ECG (independently read by me): Atrial fibrillation with ventricular rate at 53 bpm.  Left bundle branch block with repolarization changes.  QTc interval 4 1 ms.  June 15, 2017 ECG (independently read by me): Atrial fibrillation at 67 bpm.  Left bundle branch block. Variable rate ranging from probably in the , low 40s to approximately 88  October 2018 ECG (independently read by me): Marked sinus bradycardia with PACs.  Ventricular rate at 43.  Left bundle branch block with repolarization changes.  March 2018 ECG (independently read by me): Sinus bradycardia 57 bpm.  First-degree AV block with a PR interval 314 ms. .She now has a left bundle-branch block which is new since her prior tracing.  March 2017 ECG (independently read by me): Sinus bradycardia 52 bpm with first-degree block.  PR interval 284 ms.  LVH by voltage.  QS V1 through V3 concordant with old anteroseptal MI.  March 2016 ECG (independently read by me): Normal sinus rhythm.  Old anteroseptal MI.  First-degree AV block.  October 27, 2013 ECG (independently read by me): Normal sinus rhythm at 60 beats per minute.  First degree AV block with PR interval at 276 ms.  Old anteroseptal MI with QS  complex in V1 and V2.  EKG (independently read by me): Sinus rhythm at 55 beats per minute. First degree AV block. Poor progression compatible with her prior septal infarction. T-wave changes anterolaterally.  LABS:    Latest Ref Rng & Units 12/15/2019    9:17 AM 02/26/2019    2:32 PM 12/16/2018   10:32 AM  BMP  Glucose 70 - 99 mg/dL 578  469  629   BUN 8 - 23 mg/dL 13  18  19    Creatinine 0.44 - 1.00 mg/dL 5.28  4.13  2.44   Sodium 135 - 145 mmol/L 141  137  140   Potassium 3.5 - 5.1 mmol/L 3.8  3.7  3.8   Chloride 98 - 111 mmol/L 106  103  105   CO2 22 - 32 mmol/L 27  26  26    Calcium 8.9 - 10.3 mg/dL 9.4  9.5  9.5       Latest Ref Rng & Units 12/15/2019    9:17 AM 02/26/2019    2:32 PM 12/16/2018   10:32 AM  Hepatic Function  Total Protein 6.5 - 8.1 g/dL 7.1  6.9  7.0   Albumin 3.5 - 5.0 g/dL 4.0  4.0  4.0   AST 15 - 41 U/L 25  25  25    ALT 0 - 44 U/L 14  14  18    Alk Phosphatase 38 - 126 U/L 92  92  91   Total Bilirubin 0.3 - 1.2 mg/dL 0.8  0.9  0.7       Latest Ref Rng & Units 12/15/2019    9:17 AM 02/26/2019    2:32 PM 12/16/2018   10:32 AM  CBC  WBC 4.0 - 10.5 K/uL 6.1  5.6  6.0   Hemoglobin 12.0 - 15.0 g/dL 01.0  27.2  53.6   Hematocrit 36.0 - 46.0 % 40.6  38.7  38.1   Platelets 150 - 400 K/uL 222  213  198    Lab Results  Component Value Date   MCV 90.4 12/15/2019   MCV 93.7 02/26/2019   MCV 92.5 12/16/2018   Lab Results  Component Value Date   TSH 0.97 06/19/2016   Lab Results  Component Value Date   HGBA1C 5.7 (H) 10/19/2012   Lipid Panel     Component Value Date/Time   CHOL 116 06/06/2017 0922   CHOL 141 04/16/2013 0924   TRIG 75 06/06/2017 0922   TRIG 112 04/16/2013 0924   HDL 51 06/06/2017 0922   HDL 53 04/16/2013 0924   CHOLHDL 2.3 06/06/2017 0922   CHOLHDL 2.2 06/19/2016 0950   VLDL 20 06/19/2016 0950   LDLCALC 50 06/06/2017 0922   LDLCALC 66 04/16/2013 0924   RADIOLOGY: No results found.  IMPRESSION:  1. Old MI (myocardial  infarction): Anterior STEMI 10/19/2012   2. Coronary artery disease involving native coronary artery of native heart without angina pectoris   3. Permanent atrial fibrillation (HCC)   4. OSA (obstructive sleep apnea)   5. Ischemic cardiomyopathy   6. Hyperlipidemia with target LDL less than 70   7. Anticoagulation adequate     ASSESSMENT AND PLAN: Ms. Donnelly Stager is an 88 year-old young appearing female who presented to [facility] on 10/19/2012 with a STEMI after an approximate 12 hour delay with back and chest discomfort. When she arrived to [facility] System she had excellent door to balloon time at 27 minutes in the setting of an anterior wall ST segment elevation myocardial infarction. She underwent successful stenting of her LAD and because of diffuse disease tandem DES stents were inserted.  She underwent staged intervention to the circumflex  4 days later. She had concomitant 50 - 60% RCA narrowing and a large dominant right coronary artery.  She has a history of ACE inhibitor induced cough, but has tolerated ARB therapy with losartan .  A nuclear perfusion study  demonstrated significant salvage of myocardium in the majority of the LAD territory and showed distal apical scar.  She has chronic left bundle branch block.  She has permanent atrial fibrillation and has been on Eliquis for anticoagulation. Her cha2ds2vasc score is 4.  An echo revealed an EF of 40 to 45% with moderate LVH and septal and apical akinesis secondary to her prior late presentation anterior MI.  There was very mild aortic stenosis.  She had moderate left atrial dilatation.  Presently, she has continued to do well and is without any recurrent chest pain symptomatology or shortness of breath.  She remains active doing water aerobics at least 3 days/week.  Her blood pressure today is stable on her regimen of losartan 2 mg and isosorbide 30 mg.  She continues to be on atorvastatin 80 mg and Zetia 10 mg for hyperlipidemia.  Her  LDL cholesterol was 55.  She sees Dr. Dorothey Baseman who checks laboratory.  He has left foot drop and wears a brace.  She has permanent atrial fibrillation and is anticoagulated on Eliquis 5 mg twice a day.  She is continue to take low-dose aspirin.  I have suggested that if she does have any issues of possible bleeding that she can discontinue aspirin therapy.  He is now 10 years following her anterior wall myocardial infarction when stenting of her LAD from the ostium to the mid region 3.0 x 38 and 3.0 x 16 mm tandem stent placement.  She also had concomitant stenosis in her left circumflex vessel and underwent staged intervention of her  circumflex several days later with insertion of a 2.25 x 12 mm DES stent.  She continues to use CPAP with 100% compliance.  A download showed excellent benefit and at her 9 cm set pressure, AHI is 1.0.  Continues to have some neuropathy issues as well as bladder issues with residual incontinence following her mediation treatments.  Clinically she is doing exceptionally well.  I have recommended a follow-up evaluation in 1 year sooner as needed.   Lennette Bihari, MD, [facility], ABSM Diplomate, American Board of Sleep Medicine   01/04/2023 1:17 PM

## 2023-01-04 NOTE — Patient Instructions (Signed)
Medication Instructions:  NO medication changes *If you need a refill on your cardiac medications before your next appointment, please call your pharmacy*   Lab Work: NO labs If you have labs (blood work) drawn today and your tests are completely normal, you will receive your results only by: MyChart Message (if you have MyChart) OR A paper copy in the mail If you have any lab test that is abnormal or we need to change your treatment, we will call you to review the results.   Testing/Procedures: NO TESTING ORDERED   Follow-Up: At [facility], you and your health needs are our priority.  As part of our continuing mission to provide you with exceptional heart care, we have created designated Provider Care Teams.  These Care Teams include your primary Cardiologist (physician) and Advanced Practice Providers (APPs -  Physician Assistants and Nurse Practitioners) who all work together to provide you with the care you need, when you need it.  We recommend signing up for the patient portal called "MyChart".  Sign up information is provided on this After Visit Summary.  MyChart is used to connect with patients for Virtual Visits (Telemedicine).  Patients are able to view lab/test results, encounter notes, upcoming appointments, etc.  Non-urgent messages can be sent to your provider as well.   To learn more about what you can do with MyChart, go to [email or web address].    Your next appointment:   1 year(s)  Provider:  ANY AVAILABLE PROVIDER

## 2023-01-08 NOTE — Addendum Note (Signed)
Addended by: Brunetta Genera on: 01/08/2023 09:14 AM   Modules accepted: Orders

## 2023-01-22 ENCOUNTER — Other Ambulatory Visit: Payer: Self-pay | Admitting: Cardiovascular Disease

## 2023-01-22 DIAGNOSIS — I4819 Other persistent atrial fibrillation: Secondary | ICD-10-CM

## 2023-01-22 NOTE — Telephone Encounter (Signed)
Prescription refill request for Eliquis received. Indication:afib Last office visit:needs cardiology appt KGM:WNUUV labs Age: 88 Weight:93.3  kg  Prescription refilled

## 2023-02-04 ENCOUNTER — Other Ambulatory Visit: Payer: Self-pay | Admitting: Cardiovascular Disease

## 2023-02-04 DIAGNOSIS — I4819 Other persistent atrial fibrillation: Secondary | ICD-10-CM

## 2023-02-05 NOTE — Telephone Encounter (Signed)
Prescription refill request for Eliquis received. Indication:afib Last office visit:needs visit WUJ:WJXBJ labs Age: 88 Weight:93.3  kg  Prescription refilled

## 2023-02-14 ENCOUNTER — Telehealth: Payer: Self-pay | Admitting: Cardiovascular Disease

## 2023-02-14 ENCOUNTER — Other Ambulatory Visit: Payer: Self-pay | Admitting: Cardiovascular Disease

## 2023-02-14 NOTE — Telephone Encounter (Signed)
Pt c/o medication issue:  1. Name of Medication:   apixaban (ELIQUIS) 5 MG TABS tablet    2. How are you currently taking this medication (dosage and times per day)? As written  3. Are you having a reaction (difficulty breathing--STAT)? No   4. What is your medication issue? Medication is not being filled. Says needs ov and labs. Pt states she just saw Dr Tresa Endo for Sleep & Gen Card in Oct. And she gets her labs done at her PCP. Pt would like a call back

## 2023-02-14 NOTE — Telephone Encounter (Signed)
Patient identification verified by 2 forms. Marilynn Rail, RN    Called and spoke to patient  Patient states:    -Pharmacy states she needs a prescription for Eliquis, received a letter from pharmacy  -has appointment with PCP next week  Informed patient per chart review new RX sent to pharmacy 11/11 Patient states she will follow up with pharmacy, has no questions or concerns at this time

## 2023-02-17 ENCOUNTER — Other Ambulatory Visit: Payer: Self-pay | Admitting: Cardiovascular Disease

## 2023-02-17 DIAGNOSIS — I4819 Other persistent atrial fibrillation: Secondary | ICD-10-CM

## 2023-02-21 DIAGNOSIS — M8588 Other specified disorders of bone density and structure, other site: Secondary | ICD-10-CM | POA: Diagnosis not present

## 2023-02-21 DIAGNOSIS — Z13220 Encounter for screening for lipoid disorders: Secondary | ICD-10-CM | POA: Diagnosis not present

## 2023-02-21 DIAGNOSIS — I10 Essential (primary) hypertension: Secondary | ICD-10-CM | POA: Diagnosis not present

## 2023-02-21 NOTE — Telephone Encounter (Signed)
*  STAT* If patient is at the pharmacy, call can be transferred to refill team.   1. Which medications need to be refilled? (please list name of each medication and dose if known) Eliquis   2. Would you like to learn more about the convenience, safety, & potential cost savings by using the [facility]?     3. Are you open to using the [facility] (Type [facility]. .   4. Which pharmacy/location (including street and city if local pharmacy) is medication to be sent to? [facility] [address] [place]   5. Do they need a 30 day or 90 day supply? 30 days # 60 and refills- please call in today

## 2023-02-26 DIAGNOSIS — I10 Essential (primary) hypertension: Secondary | ICD-10-CM | POA: Diagnosis not present

## 2023-02-27 ENCOUNTER — Other Ambulatory Visit: Payer: Self-pay | Admitting: Cardiovascular Disease

## 2023-02-27 DIAGNOSIS — I4819 Other persistent atrial fibrillation: Secondary | ICD-10-CM

## 2023-02-28 DIAGNOSIS — T451X5A Adverse effect of antineoplastic and immunosuppressive drugs, initial encounter: Secondary | ICD-10-CM | POA: Diagnosis not present

## 2023-02-28 DIAGNOSIS — Z1331 Encounter for screening for depression: Secondary | ICD-10-CM | POA: Diagnosis not present

## 2023-02-28 DIAGNOSIS — Z0000 Encounter for general adult medical examination without abnormal findings: Secondary | ICD-10-CM | POA: Diagnosis not present

## 2023-02-28 DIAGNOSIS — G620 Drug-induced polyneuropathy: Secondary | ICD-10-CM | POA: Diagnosis not present

## 2023-02-28 DIAGNOSIS — I4891 Unspecified atrial fibrillation: Secondary | ICD-10-CM | POA: Diagnosis not present

## 2023-03-08 ENCOUNTER — Encounter: Payer: Self-pay | Admitting: Neurology

## 2023-03-12 DIAGNOSIS — M810 Age-related osteoporosis without current pathological fracture: Secondary | ICD-10-CM | POA: Diagnosis not present

## 2023-03-15 DIAGNOSIS — H2513 Age-related nuclear cataract, bilateral: Secondary | ICD-10-CM | POA: Diagnosis not present

## 2023-03-15 DIAGNOSIS — Z0100 Encounter for examination of eyes and vision without abnormal findings: Secondary | ICD-10-CM | POA: Diagnosis not present

## 2023-03-15 DIAGNOSIS — M3501 Sicca syndrome with keratoconjunctivitis: Secondary | ICD-10-CM | POA: Diagnosis not present

## 2023-03-15 DIAGNOSIS — H353131 Nonexudative age-related macular degeneration, bilateral, early dry stage: Secondary | ICD-10-CM | POA: Diagnosis not present

## 2023-03-22 NOTE — Telephone Encounter (Signed)
Called to schedule overdue f/u. VM did not come available to leave a message.

## 2023-03-25 ENCOUNTER — Other Ambulatory Visit: Payer: Self-pay | Admitting: Cardiovascular Disease

## 2023-04-09 DIAGNOSIS — H2511 Age-related nuclear cataract, right eye: Secondary | ICD-10-CM | POA: Diagnosis not present

## 2023-04-17 ENCOUNTER — Encounter: Payer: Self-pay | Admitting: Ophthalmology

## 2023-04-24 ENCOUNTER — Other Ambulatory Visit: Payer: Self-pay | Admitting: Cardiovascular Disease

## 2023-04-26 NOTE — Discharge Instructions (Signed)

## 2023-04-30 ENCOUNTER — Encounter: Payer: Self-pay | Admitting: Ophthalmology

## 2023-04-30 ENCOUNTER — Ambulatory Visit: Payer: Medicare PPO | Admitting: Neurology

## 2023-04-30 ENCOUNTER — Encounter: Payer: Self-pay | Admitting: Neurology

## 2023-04-30 VITALS — BP 131/64 | HR 72 | Ht 62.0 in | Wt 200.0 lb

## 2023-04-30 DIAGNOSIS — M48062 Spinal stenosis, lumbar region with neurogenic claudication: Secondary | ICD-10-CM | POA: Diagnosis not present

## 2023-04-30 DIAGNOSIS — T451X5A Adverse effect of antineoplastic and immunosuppressive drugs, initial encounter: Secondary | ICD-10-CM | POA: Diagnosis not present

## 2023-04-30 DIAGNOSIS — G620 Drug-induced polyneuropathy: Secondary | ICD-10-CM | POA: Diagnosis not present

## 2023-04-30 NOTE — Anesthesia Preprocedure Evaluation (Addendum)
Anesthesia Evaluation  Patient identified by MRN, date of birth, ID band Patient awake    Reviewed: Allergy & Precautions, H&P , NPO status , Patient's Chart, lab work & pertinent test results  Airway Mallampati: III  TM Distance: >3 FB Neck ROM: Full    Dental no notable dental hx.  Entire upper front teeth are caps/crowns/implants/veneers:   Pulmonary sleep apnea , former smoker   Pulmonary exam normal breath sounds clear to auscultation       Cardiovascular hypertension, + CAD and + Past MI  Normal cardiovascular exam+ dysrhythmias + Valvular Problems/Murmurs  Rhythm:Irregular Rate:Normal + Systolic murmurs Patient in a fib this morning. Rate is controlled  06-27-17 History:   PMH:   Coronary artery disease.  Hypertrophic  cardiomyopathy.  PMH:   Myocardial infarction.  Risk factors:  SVT. Atrial fibrillation Sick sinus syndrome. Bradycardia. Former tobacco use. Hypertension.  Obese. Dyslipidemia.   -------------------------------------------------------------------  Study Conclusions   - Procedure narrative: Transthoracic echocardiography. Image    quality was adequate. Intravenous contrast (Definity) was    administered.  - Left ventricle: Septal and apical akinesis No mural apical    thrombus using definity. The cavity size was moderately dilated.    Wall thickness was increased in a pattern of moderate LVH.    Systolic function was mildly to moderately reduced. The estimated    ejection fraction was in the range of 40% to 45%. The study is    not technically sufficient to allow evaluation of LV diastolic    function.  - Aortic valve: There was mild stenosis. Valve area (VTI): 2.1    cm^2. Valve area (Vmax): 1.61 cm^2. Valve area (Vmean): 1.82    cm^2.  - Left atrium: The atrium was moderately dilated.  - Atrial septum: No defect or patent foramen ovale was identified.  - Pulmonary arteries: PA peak pressure: 36 mm Hg  (S)   2014 Anterior STEMI, with DES to LAD with staged PCI, DES  Patient notes that heart rate may sometimes drop to 30, and then resolve spontaneously. Patient is not aware when this happens.   Neuro/Psych  Neuromuscular disease negative neurological ROS  negative psych ROS   GI/Hepatic negative GI ROS, Neg liver ROS,GERD  ,,  Endo/Other  negative endocrine ROS    Renal/GU Renal diseasenegative Renal ROS  negative genitourinary   Musculoskeletal negative musculoskeletal ROS (+) Arthritis ,    Abdominal   Peds negative pediatric ROS (+)  Hematology negative hematology ROS (+)   Anesthesia Other Findings Hypertension  Coronary artery disease Hyperlipidemia  NSVT (nonsustained ventricular tachycardia) (HCC) Bradycardia  Uterine cancer (HCC) Myocardial infarction (HCC)  Heart murmur Personal history of chemotherapy Personal history of radiation therapy History of Holter monitoring Echoencephalogram abnormality Neuromuscular disorder (HCC)  Chronic back pain Sleep apnea  Arthritis Mild aortic stenosis by prior echocardiogram Permanent Atrial fibrillation Hypertrophic cardiomyopathy Sick sinus syndrome--has been evaluated for pacemaker, but decision made to NOT place pacemaker, due to bradycardia, NOT on beta blockers Ischemic cardiomyopathy Murmur Mild pulmonary hypertension Urinary incontinence due to radiation therapy Morbid obesity due to excess calories  Reproductive/Obstetrics negative OB ROS                              Anesthesia Physical Anesthesia Plan  ASA: 3  Anesthesia Plan: MAC   Post-op Pain Management:    Induction: Intravenous  PONV Risk Score and Plan:   Airway Management Planned: Natural Airway and  Nasal Cannula  Additional Equipment:   Intra-op Plan:   Post-operative Plan:   Informed Consent: I have reviewed the patients History and Physical, chart, labs and discussed the procedure including the risks,  benefits and alternatives for the proposed anesthesia with the patient or authorized representative who has indicated his/her understanding and acceptance.     Dental Advisory Given  Plan Discussed with: Anesthesiologist, CRNA and Surgeon  Anesthesia Plan Comments: (Patient consented for risks of anesthesia including but not limited to:  - adverse reactions to medications - damage to eyes, teeth, lips or other oral mucosa - nerve damage due to positioning  - sore throat or hoarseness - Damage to heart, brain, nerves, lungs, other parts of body or loss of life  Patient voiced understanding and assent.)         Anesthesia Quick Evaluation

## 2023-04-30 NOTE — Patient Instructions (Signed)
We will refer you to start physical therapy

## 2023-04-30 NOTE — Progress Notes (Signed)
[facility] Clinic Note - Initial Visit   Date: 04/30/2023   Susan [name] MRN: [id number] DOB: Sep 15, 1934   Dear Dr. Terance Hart:  Thank you for your kind referral of Susan [name] for consultation of neuropathy. Although her history is well known to you, please allow [place] to reiterate it for the purpose of our medical record. The patient was accompanied to the clinic by self.    Susan [name] is a 89 y.o. right-handed female with history of uterine cancer s/p chemotherapy and radiation 2015/05/22), chemotherapy-induced neuropathy, CAD, hypertension, atrial fibrillation, lumbar spinal stenosis with chronic pain s/p spinal cord stimulator and left foot drop, GERD, hyperlipidemia, and osteoporosis presenting for evaluation of chemotherapy-induced neuropathy.   IMPRESSION/PLAN: Chemotherapy-induced neuropathy Lumbar spinal canal stenosis, severe at L4-5  Symptoms are not painful, so no role medications such as gabapentin  Start PT for balance training Continue to wear AFO on left foot. Right foot AFO declined.  Patient educated on daily foot inspection, fall prevention, and safety precautions around the home.  Return to clinic as needed  ------------------------------------------------------------- History of present illness: She was diagnosed with uterine cancer in 05-22-2015 and after her second session of chemotherapy, she began having tingling involving the feet.  Symptoms have been constant since onset.  She denies associated pain in the feet.  Numbness/tingling involves the soles of the feet, toes, and to a lesser degree over the dorsum of the feet.  She also has severe lumbar canal stenosis and left > right foot drop.  She uses a left AFO and walks with a cane.  Per review of notes under CareEverywhere, she was seen at [facility] in 05/21/2018 and tried on Cymbalta and Lyrica with no benefit.   She lives alone. She stays active and does swim aerobics at  the [facility] several times per week.  Husband passed away in 2019-05-22 from COVID complications.    Out-side paper records, electronic medical record, and images have been reviewed where available and summarized as:  MRI lumbar spine wo contrast 10/23/2016:  1. Multilevel spondylosis with grade 1 retrolisthesis of L1 on L2 and grade  1 anterolisthesis of L4 and L5.   2. Multilevel severe spondyloarthropathy with severe canal narrowing at  L4-L5. Additionally there is severe right foraminal narrowing at L2-L3,  L3-L4 and severe left foraminal narrowing L5-S1.   3. Partially visualized bilateral sacral insufficiency fractures better  characterized on same day MRI of the pelvis.    Lab Results  Component Value Date   HGBA1C 5.7 (H) 10/19/2012   Lab Results  Component Value Date   VITAMINB12 1,218 (H) 07/21/2015   Lab Results  Component Value Date   TSH 0.97 06/19/2016   No results found for: "ESRSEDRATE", "POCTSEDRATE"  Past Medical History:  Diagnosis Date   Arthritis    Bradycardia    CAD involving native coronary artery without angina pectoris    Chemotherapy-induced neuropathy (HCC)    Chronic back pain    Coronary artery disease    a. s/p STEMI in 05-21-12 with DES to LAD with staged PCI/DES placement to LCx   Echoencephalogram abnormality    Heart murmur    History of Holter monitoring    History of ST elevation myocardial infarction (STEMI)    Hyperlipidemia    Hypertension    Ischemic cardiomyopathy    LBBB (left bundle branch block)    Mild aortic stenosis by prior echocardiogram    Mild pulmonary hypertension (HCC)  Morbid obesity due to excess calories (HCC)    Myocardial infarction (HCC)    Neuromuscular disorder (HCC)    neuropathy   NSVT (nonsustained ventricular tachycardia) (HCC)    Permanent atrial fibrillation (HCC)    Personal history of chemotherapy    ENDOMETRIAL CA   Personal history of radiation therapy    ENDOMETRIAL CA   Sleep apnea    wears CPAP    Uterine cancer (HCC)    serrous    Past Surgical History:  Procedure Laterality Date   ABDOMINAL HYSTERECTOMY     BREAST EXCISIONAL BIOPSY Right 1956   NEG   CARDIOVERSION N/A 07/27/2017   Procedure: CARDIOVERSION;  Surgeon: Chilton Si, MD;  Location: [facility] END[facility]COPY;  Service: Cardiovascular;  Laterality: N/A;   LEFT HEART CATHETERIZATION WITH CORONARY ANGIOGRAM  10/19/2012   Procedure: LEFT HEART CATHETERIZATION WITH CORONARY ANGIOGRAM;  Surgeon: Lennette Bihari, MD;  Location: [facility] CATH LAB;  Service: Cardiovascular;;   PERCUTANEOUS CORONARY STENT INTERVENTION (PCI-S)  10/19/2012   Procedure: PERCUTANEOUS CORONARY STENT INTERVENTION (PCI-S);  Surgeon: Lennette Bihari, MD;  Location: [facility] CATH LAB;  Service: Cardiovascular;;   PERCUTANEOUS CORONARY STENT INTERVENTION (PCI-S) N/A 10/23/2012   Procedure: PERCUTANEOUS CORONARY STENT INTERVENTION (PCI-S);  Surgeon: Lennette Bihari, MD;  Location: [facility] CATH LAB;  Service: Cardiovascular;  Laterality: N/A;   PERIPHERAL VASCULAR CATHETERIZATION N/A 05/05/2015   Procedure: Shelda Pal Cath Insertion;  Surgeon: Annice Needy, MD;  Location: [facility] INVASIVE CV LAB;  Service: Cardiovascular;  Laterality: N/A;   PORTA CATH REMOVAL N/A 04/05/2017   Procedure: PORTA CATH REMOVAL;  Surgeon: Annice Needy, MD;  Location: [facility] INVASIVE CV LAB;  Service: Cardiovascular;  Laterality: N/A;     Medications:  Outpatient Encounter Medications as of 04/30/2023  Medication Sig   alendronate (FOSAMAX) 70 MG tablet Take 70 mg by mouth once a week.   apixaban (ELIQUIS) 5 MG TABS tablet TAKE 1 TABLET 2 (TWO) TIMES DAILY. NEEDS CARDIOLOGY APPT AND LABS FOR ELIQUIS REFILLS, CALL OFFICE   aspirin EC 81 MG tablet Take 81 mg by mouth daily.   atorvastatin (LIPITOR) 80 MG tablet TAKE 1 TABLET BY MOUTH DAILY AT 6 PM   ezetimibe (ZETIA) 10 MG tablet TAKE 1 TABLET BY MOUTH EVERY DAY   isosorbide mononitrate (IMDUR) 30 MG 24 hr tablet TAKE 1 TABLET BY MOUTH EVERY DAY   losartan (COZAAR) 25 MG  tablet TAKE 2 TABLETS BY MOUTH DAILY. PLEASE CALL OFFICE TO SCHEDULE AN APPT FOR FURTHER REFILLS. THANK YOU   Multiple Vitamins-Minerals (PRESERVISION AREDS 2 PO) Take 2 tablets by mouth daily.   NITROSTAT 0.4 MG SL tablet PLACE 1 TABLET UNDER THE TOUGUE EVERY 5 MINUTES FOR UP TO 3 DOSES AS NEEDED FOR CHEST PAIN   omeprazole (PRILOSEC) 20 MG capsule Take 20 mg by mouth daily.   fluticasone (FLONASE) 50 MCG/ACT nasal spray SPRAY 2 SPRAYS INTO EACH NOSTRIL EVERY DAY (Patient not taking: Reported on 04/30/2023)   loperamide (IMODIUM A-D) 2 MG tablet Take 2 mg by mouth 4 (four) times daily as needed for diarrhea or loose stools.  (Patient not taking: Reported on 04/30/2023)   Multiple Vitamin (MULTIVITAMIN WITH MINERALS) TABS Take 1 tablet by mouth daily. (Patient not taking: Reported on 04/30/2023)   [DISCONTINUED] Ascorbic Acid (VITAMIN C) 1000 MG tablet Take 1,000 mg by mouth daily. Taking prior to PNS procedure. (Patient not taking: Reported on 04/17/2023)   [DISCONTINUED] vitamin B-12 (CYANOCOBALAMIN) 1000 MCG tablet Take 1,000 mcg by mouth daily. (Patient  not taking: Reported on 04/17/2023)   Facility-Administered Encounter Medications as of 04/30/2023  Medication   sodium chloride flush (NS) 0.9 % injection 10 mL   sodium chloride flush (NS) 0.9 % injection 10 mL    Allergies:  Allergies  Allergen Reactions   Ace Inhibitors Cough   Penicillins Rash and Other (See Comments)    Has patient had a PCN reaction causing immediate rash, facial/tongue/throat swelling, SOB or lightheadedness with hypotension: No Has patient had a PCN reaction causing severe rash involving mucus membranes or skin necrosis: No Has patient had a PCN reaction that required hospitalization: No Has patient had a PCN reaction occurring within the last 10 years: No If all of the above answers are "NO", then may proceed with Cephalosporin use.     Family History: Family History  Problem Relation Age of Onset   Heart disease  Mother    Breast cancer Mother 41   Heart disease Father    Diabetes Father    Diabetes Brother    Stroke Brother    Kidney cancer Neg Hx    Bladder Cancer Neg Hx     Social History: Social History   Tobacco Use   Smoking status: Former    Current packs/day: 0.00    Types: Cigarettes    Quit date: 1976    Years since quitting: 49.1   Smokeless tobacco: Never  Vaping Use   Vaping status: Never Used  Substance Use Topics   Alcohol use: Yes    Alcohol/week: 1.0 standard drink of alcohol    Types: 1 Glasses of wine per week   Drug use: No   Social History   Social History Narrative   Are you right handed or left handed? Right Handed    Are you currently employed ? No    What is your current occupation?    Do you live at home alone? Yes   Who lives with you?    What type of home do you live in: 1 story or 2 story? Lives in a two story home. Lives downstairs         Vital Signs:  BP 131/64   Pulse 72   Ht 5\' 2"  (1.575 m)   Wt 200 lb (90.7 kg)   SpO2 96%   BMI 36.58 kg/m    Neurological Exam: MENTAL STATUS including orientation to time, place, person, recent and remote memory, attention span and concentration, language, and fund of knowledge is normal.  Speech is not dysarthric.  CRANIAL NERVES: II:  No visual field defects.     III-IV-VI: Pupils equal round and reactive to light.  Normal conjugate, extra-ocular eye movements in all directions of gaze.  No nystagmus.  No ptosis.   V:  Normal facial sensation.    VII:  Normal facial symmetry and movements.   VIII:  Normal hearing and vestibular function.   IX-X:  Normal palatal movement.   XI:  Normal shoulder shrug and head rotation.   XII:  Normal tongue strength and range of motion, no deviation or fasciculation.  MOTOR:  No atrophy, fasciculations or abnormal movements.  No pronator drift.   Upper Extremity:  Right  Left  Deltoid  5/5   5/5   Biceps  5/5   5/5   Triceps  5/5   5/5   Wrist extensors   5/5   5/5   Wrist flexors  5/5   5/5   Finger extensors  5/5   5/5  Finger flexors  5/5   5/5   Dorsal interossei  5/5   5/5   Abductor pollicis  5/5   5/5   Tone (Ashworth scale)  0  0   Lower Extremity:  Right  Left  Hip flexors  5/5   5/5   Knee flexors  5/5   5/5   Knee extensors  5/5   5/5   Dorsiflexors  4/5   4-/5   Plantarflexors  5/5   5/5   Toe extensors  4/5   4-/5   Toe flexors  5/5   5/5   Tone (Ashworth scale)  0  0   MSRs:                                           Right        Left brachioradialis 2+  2+  biceps 2+  2+  triceps 2+  2+  patellar 2+  2+  ankle jerk 0  0  Hoffman no  no  plantar response down  down   SENSORY:  Vibration intact at the MCP, reduced at the knees and absent below the ankles.  Temperature intact throughout.  Hyperesthesia with pin prick over the feet.   COORDINATION/GAIT: Normal finger-to- nose-finger.  Gait was tested with cane, there is mild steppage of both feet, worse on the left, gait is slow, stable.     Thank you for allowing me to participate in patient's care.  If I can answer any additional questions, I would be pleased to do so.    Sincerely,    Lorian Yaun K. Allena Katz, DO

## 2023-05-01 ENCOUNTER — Ambulatory Visit
Admission: RE | Admit: 2023-05-01 | Discharge: 2023-05-01 | Disposition: A | Discharge status: Home / Self Care | Payer: Medicare PPO | Attending: Ophthalmology | Admitting: Ophthalmology

## 2023-05-01 ENCOUNTER — Ambulatory Visit: Payer: Medicare PPO | Admitting: Anesthesiology

## 2023-05-01 ENCOUNTER — Other Ambulatory Visit: Payer: Self-pay

## 2023-05-01 ENCOUNTER — Encounter: Payer: Self-pay | Admitting: Ophthalmology

## 2023-05-01 ENCOUNTER — Encounter
Admission: RE | Disposition: A | Discharge status: Home / Self Care | Payer: Self-pay | Source: Home / Self Care | Attending: Ophthalmology

## 2023-05-01 DIAGNOSIS — I252 Old myocardial infarction: Secondary | ICD-10-CM | POA: Diagnosis not present

## 2023-05-01 DIAGNOSIS — K219 Gastro-esophageal reflux disease without esophagitis: Secondary | ICD-10-CM | POA: Insufficient documentation

## 2023-05-01 DIAGNOSIS — G4733 Obstructive sleep apnea (adult) (pediatric): Secondary | ICD-10-CM | POA: Diagnosis not present

## 2023-05-01 DIAGNOSIS — I4821 Permanent atrial fibrillation: Secondary | ICD-10-CM | POA: Diagnosis not present

## 2023-05-01 DIAGNOSIS — I38 Endocarditis, valve unspecified: Secondary | ICD-10-CM | POA: Insufficient documentation

## 2023-05-01 DIAGNOSIS — H2512 Age-related nuclear cataract, left eye: Secondary | ICD-10-CM | POA: Insufficient documentation

## 2023-05-01 DIAGNOSIS — Z87891 Personal history of nicotine dependence: Secondary | ICD-10-CM | POA: Diagnosis not present

## 2023-05-01 DIAGNOSIS — I10 Essential (primary) hypertension: Secondary | ICD-10-CM | POA: Diagnosis not present

## 2023-05-01 DIAGNOSIS — I2510 Atherosclerotic heart disease of native coronary artery without angina pectoris: Secondary | ICD-10-CM | POA: Diagnosis not present

## 2023-05-01 DIAGNOSIS — I4891 Unspecified atrial fibrillation: Secondary | ICD-10-CM | POA: Diagnosis not present

## 2023-05-01 HISTORY — DX: Ischemic cardiomyopathy: I25.5

## 2023-05-01 HISTORY — DX: Unspecified osteoarthritis, unspecified site: M19.90

## 2023-05-01 HISTORY — DX: Permanent atrial fibrillation: I48.21

## 2023-05-01 HISTORY — DX: Adverse effect of antineoplastic and immunosuppressive drugs, initial encounter: T45.1X5A

## 2023-05-01 HISTORY — DX: Left bundle-branch block, unspecified: I44.7

## 2023-05-01 HISTORY — DX: Adverse effect of antineoplastic and immunosuppressive drugs, initial encounter: G62.0

## 2023-05-01 HISTORY — DX: Sleep apnea, unspecified: G47.30

## 2023-05-01 HISTORY — DX: Atherosclerotic heart disease of native coronary artery without angina pectoris: I25.10

## 2023-05-01 HISTORY — DX: Obstructive sleep apnea (adult) (pediatric): G47.33

## 2023-05-01 HISTORY — DX: Nonrheumatic aortic (valve) stenosis: I35.0

## 2023-05-01 HISTORY — DX: Morbid (severe) obesity due to excess calories: E66.01

## 2023-05-01 HISTORY — PX: CATARACT EXTRACTION W/PHACO: SHX586

## 2023-05-01 HISTORY — DX: Old myocardial infarction: I25.2

## 2023-05-01 HISTORY — DX: Pulmonary hypertension, unspecified: I27.20

## 2023-05-01 SURGERY — PHACOEMULSIFICATION, CATARACT, WITH IOL INSERTION
Anesthesia: Monitor Anesthesia Care | Laterality: Left

## 2023-05-01 MED ORDER — SIGHTPATH DOSE#1 NA CHONDROIT SULF-NA HYALURON 40-17 MG/ML IO SOLN
INTRAOCULAR | Status: DC | PRN
  Administered 2023-05-01: 1 mL via INTRAOCULAR

## 2023-05-01 MED ORDER — BRIMONIDINE TARTRATE-TIMOLOL 0.2-0.5 % OP SOLN
OPHTHALMIC | Status: DC | PRN
  Administered 2023-05-01: 1 [drp] via OPHTHALMIC

## 2023-05-01 MED ORDER — TETRACAINE HCL 0.5 % OP SOLN
1.0000 [drp] | OPHTHALMIC | Status: DC | PRN
  Administered 2023-05-01 (×3): 1 [drp] via OPHTHALMIC

## 2023-05-01 MED ORDER — MIDAZOLAM HCL 2 MG/2ML IJ SOLN
INTRAMUSCULAR | Status: AC
  Filled 2023-05-01: qty 2

## 2023-05-01 MED ORDER — ARMC OPHTHALMIC DILATING DROPS
1.0000 | OPHTHALMIC | Status: DC | PRN
  Administered 2023-05-01 (×3): 1 via OPHTHALMIC

## 2023-05-01 MED ORDER — TETRACAINE HCL 0.5 % OP SOLN
OPHTHALMIC | Status: AC
  Filled 2023-05-01: qty 4

## 2023-05-01 MED ORDER — SIGHTPATH DOSE#1 BSS IO SOLN
INTRAOCULAR | Status: DC | PRN
  Administered 2023-05-01: 2 mL

## 2023-05-01 MED ORDER — SIGHTPATH DOSE#1 BSS IO SOLN
INTRAOCULAR | Status: DC | PRN
  Administered 2023-05-01: 15 mL via INTRAOCULAR

## 2023-05-01 MED ORDER — MOXIFLOXACIN HCL 0.5 % OP SOLN
OPHTHALMIC | Status: DC | PRN
  Administered 2023-05-01: 1 [drp] via OPHTHALMIC

## 2023-05-01 MED ORDER — FENTANYL CITRATE (PF) 100 MCG/2ML IJ SOLN
INTRAMUSCULAR | Status: AC
  Filled 2023-05-01: qty 2

## 2023-05-01 MED ORDER — MIDAZOLAM HCL 2 MG/2ML IJ SOLN
INTRAMUSCULAR | Status: DC | PRN
  Administered 2023-05-01: .5 mg via INTRAVENOUS

## 2023-05-01 MED ORDER — ARMC OPHTHALMIC DILATING DROPS
OPHTHALMIC | Status: AC
  Filled 2023-05-01: qty 0.5

## 2023-05-01 MED ORDER — FENTANYL CITRATE (PF) 100 MCG/2ML IJ SOLN
INTRAMUSCULAR | Status: DC | PRN
  Administered 2023-05-01: 50 ug via INTRAVENOUS

## 2023-05-01 MED ORDER — SIGHTPATH DOSE#1 BSS IO SOLN
INTRAOCULAR | Status: DC | PRN
  Administered 2023-05-01: 50 mL via OPHTHALMIC

## 2023-05-01 SURGICAL SUPPLY — 11 items
CATARACT SUITE SIGHTPATH (MISCELLANEOUS) ×1 IMPLANT
CYSTOTOME ANG REV CUT SHRT 25G (CUTTER) ×1
CYSTOTOME ANGL RVRS SHRT 25G (CUTTER) ×1 IMPLANT
CYSTOTOME ANGL RVRS SHRT 25GA (CUTTER) ×1 IMPLANT
FEE CATARACT SUITE SIGHTPATH (MISCELLANEOUS) ×1 IMPLANT
GLOVE BIOGEL PI IND STRL 8 (GLOVE) ×1 IMPLANT
GLOVE SURG LX STRL 8.0 MICRO (GLOVE) ×1 IMPLANT
LENS IOL TECNIS EYHANCE 20.5 (Intraocular Lens) IMPLANT
NDL FILTER BLUNT 18X1 1/2 (NEEDLE) ×1 IMPLANT
NEEDLE FILTER BLUNT 18X1 1/2 (NEEDLE) ×1 IMPLANT
SYR 3ML LL SCALE MARK (SYRINGE) ×1 IMPLANT

## 2023-05-01 NOTE — Transfer of Care (Signed)
 Immediate Anesthesia Transfer of Care Note  Patient: Susan [name]  Procedure(s) Performed: CATARACT EXTRACTION PHACO AND INTRAOCULAR LENS PLACEMENT (IOC) LEFT 6.80 00:45.0 (Left)  Patient Location: PACU  Anesthesia Type: MAC  Level of Consciousness: awake, alert  and patient cooperative  Airway and Oxygen Therapy: Patient Spontanous Breathing and Patient connected to supplemental oxygen  Post-op Assessment: Post-op Vital signs reviewed, Patient's Cardiovascular Status Stable, Respiratory Function Stable, Patent Airway and No signs of Nausea or vomiting  Post-op Vital Signs: Reviewed and stable  Complications: No notable events documented.

## 2023-05-01 NOTE — H&P (Signed)
 [facility]   Primary Care Physician:  Glover Lenis, MD Ophthalmologist: Dr. Jaye  Pre-Procedure History & Physical: HPI:  Susan [name] is a 89 y.o. female here for cataract surgery.   Past Medical History:  Diagnosis Date   Arthritis    Bradycardia    CAD involving native coronary artery without angina pectoris    Chemotherapy-induced neuropathy (HCC)    Chronic back pain    Coronary artery disease    a. s/p STEMI in 2014 with DES to LAD with staged PCI/DES placement to LCx   Echoencephalogram abnormality    Heart murmur    History of Holter monitoring    History of ST elevation myocardial infarction (STEMI)    Hyperlipidemia    Hypertension    Ischemic cardiomyopathy    LBBB (left bundle branch block)    Mild aortic stenosis by prior echocardiogram    Mild pulmonary hypertension (HCC)    Morbid obesity due to excess calories (HCC)    Myocardial infarction (HCC)    Neuromuscular disorder (HCC)    neuropathy   NSVT (nonsustained ventricular tachycardia) (HCC)    OSA on CPAP    Permanent atrial fibrillation (HCC)    Personal history of chemotherapy    ENDOMETRIAL CA   Personal history of radiation therapy    ENDOMETRIAL CA   Sleep apnea    wears CPAP   Uterine cancer (HCC)    serrous    Past Surgical History:  Procedure Laterality Date   ABDOMINAL HYSTERECTOMY     BREAST EXCISIONAL BIOPSY Right 1956   NEG   CARDIOVERSION N/A 07/27/2017   Procedure: CARDIOVERSION;  Surgeon: Raford Riggs, MD;  Location: [facility] ENDOS[facility];  Service: Cardiovascular;  Laterality: N/A;   LEFT HEART CATHETERIZATION WITH CORONARY ANGIOGRAM  10/19/2012   Procedure: LEFT HEART CATHETERIZATION WITH CORONARY ANGIOGRAM;  Surgeon: Debby DELENA Sor, MD;  Location: [facility] CATH LAB;  Service: Cardiovascular;;   PERCUTANEOUS CORONARY STENT INTERVENTION (PCI-S)  10/19/2012   Procedure: PERCUTANEOUS CORONARY STENT INTERVENTION (PCI-S);  Surgeon: Debby DELENA Sor, MD;  Location: [facility] CATH LAB;   Service: Cardiovascular;;   PERCUTANEOUS CORONARY STENT INTERVENTION (PCI-S) N/A 10/23/2012   Procedure: PERCUTANEOUS CORONARY STENT INTERVENTION (PCI-S);  Surgeon: Debby DELENA Sor, MD;  Location: [facility] CATH LAB;  Service: Cardiovascular;  Laterality: N/A;   PERIPHERAL VASCULAR CATHETERIZATION N/A 05/05/2015   Procedure: Pat Cath Insertion;  Surgeon: Selinda GORMAN Gu, MD;  Location: [facility] INVASIVE CV LAB;  Service: Cardiovascular;  Laterality: N/A;   PORTA CATH REMOVAL N/A 04/05/2017   Procedure: PORTA CATH REMOVAL;  Surgeon: Gu Selinda GORMAN, MD;  Location: [facility] INVASIVE CV LAB;  Service: Cardiovascular;  Laterality: N/A;    Prior to Admission medications   Medication Sig Start Date End Date Taking? Authorizing Provider  alendronate (FOSAMAX) 70 MG tablet Take 70 mg by mouth once a week.   Yes [provider]  apixaban  (ELIQUIS ) 5 MG TABS tablet TAKE 1 TABLET 2 (TWO) TIMES DAILY. NEEDS CARDIOLOGY APPT AND LABS FOR ELIQUIS  REFILLS, CALL OFFICE 02/05/23  Yes Sor Debby DELENA, MD  aspirin  EC 81 MG tablet Take 81 mg by mouth daily.   Yes [provider]  atorvastatin  (LIPITOR ) 80 MG tablet TAKE 1 TABLET BY MOUTH DAILY AT 6 PM 04/24/23  Yes Sor Debby DELENA, MD  ezetimibe  (ZETIA ) 10 MG tablet TAKE 1 TABLET BY MOUTH EVERY DAY 11/24/22  Yes Sor Debby DELENA, MD  isosorbide  mononitrate (IMDUR ) 30 MG 24 hr tablet TAKE 1 TABLET BY  MOUTH EVERY DAY 02/14/23  Yes Burnard Debby LABOR, MD  losartan  (COZAAR ) 25 MG tablet TAKE 2 TABLETS BY MOUTH DAILY. PLEASE CALL OFFICE TO SCHEDULE AN APPT FOR FURTHER REFILLS. THANK YOU 02/19/23  Yes Burnard Debby LABOR, MD  Multiple Vitamins-Minerals (PRESERVISION AREDS 2 PO) Take 2 tablets by mouth daily.   Yes [provider]  omeprazole (PRILOSEC) 20 MG capsule Take 20 mg by mouth daily.   Yes [provider]  fluticasone (FLONASE) 50 MCG/ACT nasal spray SPRAY 2 SPRAYS INTO EACH NOSTRIL EVERY DAY Patient not taking: Reported on 04/30/2023 08/14/17   [provider]  loperamide (IMODIUM A-D) 2 MG tablet Take 2 mg by mouth 4 (four) times daily as needed for diarrhea or loose stools.  Patient not taking: Reported on 04/30/2023    [provider]  Multiple Vitamin (MULTIVITAMIN WITH MINERALS) TABS Take 1 tablet by mouth daily. Patient not taking: Reported on 04/30/2023    [provider]  NITROSTAT  0.4 MG SL tablet PLACE 1 TABLET UNDER THE TOUGUE EVERY 5 MINUTES FOR UP TO 3 DOSES AS NEEDED FOR CHEST PAIN 01/26/16   Burnard Debby LABOR, MD    Allergies as of 03/29/2023 - Review Complete 01/04/2023  Allergen Reaction Noted   Ace inhibitors Cough 11/30/2015   Penicillins Rash and Other (See Comments) 10/19/2012    Family History  Problem Relation Age of Onset   Heart disease Mother    Breast cancer Mother 19   Heart disease Father    Diabetes Father    Diabetes Brother    Stroke Brother    Kidney cancer Neg Hx    Bladder Cancer Neg Hx     Social History   Socioeconomic History   Marital status: Widowed    Spouse name: Not on file   Number of children: Not on file   Years of education: Not on file   Highest education level: Not on file  Occupational History   Not on file  Tobacco Use   Smoking status: Former    Current packs/day: 0.00    Types: Cigarettes    Quit date: 56    Years since quitting: 49.1   Smokeless tobacco: Never  Vaping Use   Vaping status: Never Used  Substance and Sexual Activity   Alcohol use: Yes    Alcohol/week: 1.0 standard drink of alcohol    Types: 1 Glasses of wine per week   Drug use: No   Sexual activity: Not Currently  Other Topics Concern   Not on file  Social History Narrative   Are you right handed or left handed? Right Handed    Are you currently employed ? No    What is your current occupation?    Do you live at home alone? Yes   Who lives with you?    What type of home do you live in: 1 story or 2 story? Lives in a two story home. Lives downstairs        Social  Drivers of Health   Financial Resource Strain: Low Risk  (02/28/2023)   Received from [facility]   Overall Financial Resource Strain (CARDIA)    Difficulty of Paying Living Expenses: Not hard at all  Food Insecurity: No Food Insecurity (02/28/2023)   Received from [facility]   Hunger Vital Sign    Worried About Running Out of Food in the Last Year: Never true    Ran Out of Food in the  Last Year: Never true  Transportation Needs: No Transportation Needs (02/28/2023)   Received from [facility] - Transportation    In the past 12 months, has lack of transportation kept you from medical appointments or from getting medications?: No    Lack of Transportation (Non-Medical): No  Physical Activity: Not on file  Stress: Not on file  Social Connections: Not on file  Intimate Partner Violence: Not on file    Review of Systems: See HPI, otherwise negative ROS  Physical Exam: BP (!) 152/82   Pulse (!) 58   Temp 99 F (37.2 C) (Temporal)   Ht 5' 2 (1.575 m)   Wt 90.3 kg   SpO2 96%   BMI 36.40 kg/m  General:   Alert, cooperative in NAD Head:  Normocephalic and atraumatic. Respiratory:  Normal work of breathing. Cardiovascular:  RRR  Impression/Plan: Susan [name] is here for cataract surgery.  Risks, benefits, limitations, and alternatives regarding cataract surgery have been reviewed with the patient.  Questions have been answered.  All parties agreeable.   Elsie Carmine, MD  05/01/2023, 8:33 AM

## 2023-05-01 NOTE — Op Note (Signed)
PREOPERATIVE DIAGNOSIS:  Nuclear sclerotic cataract of the left eye.   POSTOPERATIVE DIAGNOSIS:  Nuclear sclerotic cataract of the left eye.   OPERATIVE PROCEDURE:ORPROCALL@   SURGEON:  Galen Manila, MD.   ANESTHESIA:  Anesthesiologist: Marisue Humble, MD CRNA: Barbette Hair, CRNA  1.      Managed anesthesia care. 2.     0.74ml of Shugarcaine was instilled following the paracentesis   COMPLICATIONS:  None.   TECHNIQUE:   Stop and chop   DESCRIPTION OF PROCEDURE:  The patient was examined and consented in the preoperative holding area where the aforementioned topical anesthesia was applied to the left eye and then brought back to the Operating Room where the left eye was prepped and draped in the usual sterile ophthalmic fashion and a lid speculum was placed. A paracentesis was created with the side port blade and the anterior chamber was filled with viscoelastic. A near clear corneal incision was performed with the steel keratome. A continuous curvilinear capsulorrhexis was performed with a cystotome followed by the capsulorrhexis forceps. Hydrodissection and hydrodelineation were carried out with BSS on a blunt cannula. The lens was removed in a stop and chop  technique and the remaining cortical material was removed with the irrigation-aspiration handpiece. The capsular bag was inflated with viscoelastic and the Technis [id number] lens was placed in the capsular bag without complication. The remaining viscoelastic was removed from the eye with the irrigation-aspiration handpiece. The wounds were hydrated. The anterior chamber was flushed with BSS and the eye was inflated to physiologic pressure. 0.45ml Vigamox was placed in the anterior chamber. The wounds were found to be water tight. The eye was dressed with Combigan. The patient was given protective glasses to wear throughout the day and a shield with which to sleep tonight. The patient was also given drops with which to begin a drop regimen  today and will follow-up with me in one day. Implant Name Type Inv. Item Serial No. Manufacturer Lot No. LRB No. Used Action  LENS IOL TECNIS EYHANCE 20.5 - [id number] Intraocular Lens LENS IOL TECNIS EYHANCE 20.5 [id number] [employer]  Left 1 Implanted    Procedure(s): CATARACT EXTRACTION PHACO AND INTRAOCULAR LENS PLACEMENT (IOC) LEFT 6.80 00:45.0 (Left)  Electronically signed: Galen Manila 05/01/2023 8:53 AM

## 2023-05-01 NOTE — Anesthesia Postprocedure Evaluation (Signed)
 Anesthesia Post Note  Patient: Susan [name]  Procedure(s) Performed: CATARACT EXTRACTION PHACO AND INTRAOCULAR LENS PLACEMENT (IOC) LEFT 6.80 00:45.0 (Left)  Patient location during evaluation: PACU Anesthesia Type: MAC Level of consciousness: awake and alert Pain management: pain level controlled Vital Signs Assessment: post-procedure vital signs reviewed and stable Respiratory status: spontaneous breathing, nonlabored ventilation, respiratory function stable and patient connected to nasal cannula oxygen Cardiovascular status: stable and blood pressure returned to baseline Postop Assessment: no apparent nausea or vomiting Anesthetic complications: no   No notable events documented.   Last Vitals:  Vitals:   05/01/23 0853 05/01/23 0858  BP: (!) 140/68 139/77  Pulse: (!) 53 (!) 44  Resp: 14 16  Temp: (!) 36.1 C (!) 36.1 C  SpO2: 96% 98%    Last Pain:  Vitals:   05/01/23 0858  TempSrc:   PainSc: 0-No pain                 Verba Ainley C Varetta Chavers

## 2023-05-02 ENCOUNTER — Encounter: Payer: Self-pay | Admitting: Ophthalmology

## 2023-05-11 NOTE — Discharge Instructions (Signed)

## 2023-05-14 NOTE — Anesthesia Preprocedure Evaluation (Signed)
 Anesthesia Evaluation  Patient identified by MRN, date of birth, ID band Patient awake    Reviewed: Allergy & Precautions, H&P , NPO status , Patient's Chart, lab work & pertinent test results  Airway Mallampati: III  TM Distance: >3 FB Neck ROM: Full    Dental no notable dental hx. (+) Caps Entire upper front teeth are caps/crowns/implants/veneers: :   Pulmonary sleep apnea , former smoker   Pulmonary exam normal breath sounds clear to auscultation       Cardiovascular hypertension, + CAD and + Past MI  Normal cardiovascular exam+ dysrhythmias + Valvular Problems/Murmurs  Rhythm:Irregular Rate:Normal  06-27-17 History:   PMH:   Coronary artery disease.  Hypertrophic  cardiomyopathy.  PMH:   Myocardial infarction.  Risk factors:  SVT. Atrial fibrillation Sick sinus syndrome. Bradycardia. Former tobacco use. Hypertension.  Obese. Dyslipidemia.    -------------------------------------------------------------------  Study Conclusions    - Procedure narrative: Transthoracic echocardiography. Image    quality was adequate. Intravenous contrast (Definity) was    administered.  - Left ventricle: Septal and apical akinesis No mural apical    thrombus using definity. The cavity size was moderately dilated.    Wall thickness was increased in a pattern of moderate LVH.    Systolic function was mildly to moderately reduced. The estimated    ejection fraction was in the range of 40% to 45%. The study is    not technically sufficient to allow evaluation of LV diastolic    function.  - Aortic valve: There was mild stenosis. Valve area (VTI): 2.1    cm^2. Valve area (Vmax): 1.61 cm^2. Valve area (Vmean): 1.82    cm^2.  - Left atrium: The atrium was moderately dilated.  - Atrial septum: No defect or patent foramen ovale was identified.  - Pulmonary arteries: PA peak pressure: 36 mm Hg (S)    2014 Anterior STEMI, with DES to LAD with staged  PCI, DES   Patient notes that heart rate may sometimes drop to 30, and then resolve spontaneously. Patient is not aware when this happens.      Neuro/Psych  Neuromuscular disease negative neurological ROS  negative psych ROS   GI/Hepatic negative GI ROS, Neg liver ROS,GERD  ,,  Endo/Other  negative endocrine ROS    Renal/GU Renal diseasenegative Renal ROS  negative genitourinary   Musculoskeletal negative musculoskeletal ROS (+) Arthritis ,    Abdominal   Peds negative pediatric ROS (+)  Hematology negative hematology ROS (+)   Anesthesia Other Findings Previous cataract surgery 05-01-23 had versed 0.5 mg IV and fentanyl 50 mcg IV  Hypertension  Coronary artery disease Hyperlipidemia  NSVT (nonsustained ventricular tachycardia) (HCC) Bradycardia  Uterine cancer (HCC) Myocardial infarction (HCC)  Heart murmur Personal history of chemotherapy Personal history of radiation therapy History of Holter monitoring  Echoencephalogram abnormality Neuromuscular disorder (HCC)  Chronic back pain Sleep apnea Arthritis Mild aortic stenosis by prior echocardiogram  History of ST elevation myocardial infarction (STEMI) CAD involving native coronary artery without angina pectoris  Permanent atrial fibrillation (HCC) Ischemic cardiomyopathy  Chemotherapy-induced neuropathy (HCC) Mild pulmonary hypertension (HCC)  LBBB (left bundle branch block) Morbid obesity due to excess calories (HCC)  OSA on CPAP    Reproductive/Obstetrics negative OB ROS                             Anesthesia Physical Anesthesia Plan  ASA: 4  Anesthesia Plan: MAC   Post-op Pain Management:  Induction: Intravenous  PONV Risk Score and Plan:   Airway Management Planned: Natural Airway and Nasal Cannula  Additional Equipment:   Intra-op Plan:   Post-operative Plan:   Informed Consent: I have reviewed the patients History and Physical, chart, labs and discussed  the procedure including the risks, benefits and alternatives for the proposed anesthesia with the patient or authorized representative who has indicated his/her understanding and acceptance.     Dental Advisory Given  Plan Discussed with: Anesthesiologist, CRNA and Surgeon  Anesthesia Plan Comments: (Patient consented for risks of anesthesia including but not limited to:  - adverse reactions to medications - damage to eyes, teeth, lips or other oral mucosa - nerve damage due to positioning  - sore throat or hoarseness - Damage to heart, brain, nerves, lungs, other parts of body or loss of life  Patient voiced understanding and assent.)        Anesthesia Quick Evaluation

## 2023-05-15 ENCOUNTER — Other Ambulatory Visit: Payer: Self-pay

## 2023-05-15 ENCOUNTER — Encounter
Admission: RE | Disposition: A | Discharge status: Home / Self Care | Payer: Self-pay | Source: Home / Self Care | Attending: Ophthalmology

## 2023-05-15 ENCOUNTER — Ambulatory Visit: Payer: Medicare PPO | Admitting: Anesthesiology

## 2023-05-15 ENCOUNTER — Ambulatory Visit
Admission: RE | Admit: 2023-05-15 | Discharge: 2023-05-15 | Disposition: A | Discharge status: Home / Self Care | Payer: Medicare PPO | Attending: Ophthalmology | Admitting: Ophthalmology

## 2023-05-15 DIAGNOSIS — I2510 Atherosclerotic heart disease of native coronary artery without angina pectoris: Secondary | ICD-10-CM | POA: Insufficient documentation

## 2023-05-15 DIAGNOSIS — I4821 Permanent atrial fibrillation: Secondary | ICD-10-CM | POA: Insufficient documentation

## 2023-05-15 DIAGNOSIS — H2511 Age-related nuclear cataract, right eye: Secondary | ICD-10-CM | POA: Diagnosis not present

## 2023-05-15 DIAGNOSIS — I447 Left bundle-branch block, unspecified: Secondary | ICD-10-CM | POA: Diagnosis not present

## 2023-05-15 DIAGNOSIS — I252 Old myocardial infarction: Secondary | ICD-10-CM | POA: Diagnosis not present

## 2023-05-15 DIAGNOSIS — G4733 Obstructive sleep apnea (adult) (pediatric): Secondary | ICD-10-CM | POA: Diagnosis not present

## 2023-05-15 DIAGNOSIS — I10 Essential (primary) hypertension: Secondary | ICD-10-CM | POA: Insufficient documentation

## 2023-05-15 DIAGNOSIS — Z87891 Personal history of nicotine dependence: Secondary | ICD-10-CM | POA: Diagnosis not present

## 2023-05-15 DIAGNOSIS — K219 Gastro-esophageal reflux disease without esophagitis: Secondary | ICD-10-CM | POA: Insufficient documentation

## 2023-05-15 HISTORY — PX: CATARACT EXTRACTION W/PHACO: SHX586

## 2023-05-15 SURGERY — PHACOEMULSIFICATION, CATARACT, WITH IOL INSERTION
Anesthesia: Monitor Anesthesia Care | Laterality: Right

## 2023-05-15 MED ORDER — TETRACAINE HCL 0.5 % OP SOLN
OPHTHALMIC | Status: AC
  Filled 2023-05-15: qty 4

## 2023-05-15 MED ORDER — BRIMONIDINE TARTRATE-TIMOLOL 0.2-0.5 % OP SOLN
OPHTHALMIC | Status: DC | PRN
  Administered 2023-05-15: 1 [drp] via OPHTHALMIC

## 2023-05-15 MED ORDER — FENTANYL CITRATE (PF) 100 MCG/2ML IJ SOLN
INTRAMUSCULAR | Status: AC
  Filled 2023-05-15: qty 2

## 2023-05-15 MED ORDER — TETRACAINE HCL 0.5 % OP SOLN
1.0000 [drp] | OPHTHALMIC | Status: DC | PRN
  Administered 2023-05-15 (×2): 1 [drp] via OPHTHALMIC

## 2023-05-15 MED ORDER — ARMC OPHTHALMIC DILATING DROPS
1.0000 | OPHTHALMIC | Status: DC | PRN
  Administered 2023-05-15 (×3): 1 via OPHTHALMIC

## 2023-05-15 MED ORDER — MIDAZOLAM HCL 2 MG/2ML IJ SOLN
INTRAMUSCULAR | Status: DC | PRN
  Administered 2023-05-15: .5 mg via INTRAVENOUS

## 2023-05-15 MED ORDER — MIDAZOLAM HCL 2 MG/2ML IJ SOLN
INTRAMUSCULAR | Status: AC
Start: 2023-05-15 — End: ?
  Filled 2023-05-15: qty 2

## 2023-05-15 MED ORDER — FENTANYL CITRATE (PF) 100 MCG/2ML IJ SOLN
INTRAMUSCULAR | Status: DC | PRN
  Administered 2023-05-15: 50 ug via INTRAVENOUS

## 2023-05-15 MED ORDER — SIGHTPATH DOSE#1 BSS IO SOLN
INTRAOCULAR | Status: DC | PRN
  Administered 2023-05-15: 15 mL via INTRAOCULAR

## 2023-05-15 MED ORDER — MOXIFLOXACIN HCL 0.5 % OP SOLN
OPHTHALMIC | Status: DC | PRN
  Administered 2023-05-15: .2 mL via OPHTHALMIC

## 2023-05-15 MED ORDER — SIGHTPATH DOSE#1 BSS IO SOLN
INTRAOCULAR | Status: DC | PRN
  Administered 2023-05-15: 44 mL via OPHTHALMIC

## 2023-05-15 MED ORDER — ARMC OPHTHALMIC DILATING DROPS
OPHTHALMIC | Status: AC
  Filled 2023-05-15: qty 0.5

## 2023-05-15 MED ORDER — SIGHTPATH DOSE#1 BSS IO SOLN
INTRAOCULAR | Status: DC | PRN
  Administered 2023-05-15: 2 mL

## 2023-05-15 MED ORDER — SIGHTPATH DOSE#1 NA CHONDROIT SULF-NA HYALURON 40-17 MG/ML IO SOLN
INTRAOCULAR | Status: DC | PRN
  Administered 2023-05-15: 1 mL via INTRAOCULAR

## 2023-05-15 SURGICAL SUPPLY — 11 items
CATARACT SUITE SIGHTPATH (MISCELLANEOUS) ×1 IMPLANT
CYSTOTOME ANG REV CUT SHRT 25G (CUTTER) ×1 IMPLANT
CYSTOTOME ANGL RVRS SHRT 25G (CUTTER) ×1 IMPLANT
CYSTOTOME ANGL RVRS SHRT 25GA (CUTTER) ×1 IMPLANT
FEE CATARACT SUITE SIGHTPATH (MISCELLANEOUS) ×1 IMPLANT
GLOVE BIOGEL PI IND STRL 8 (GLOVE) ×1 IMPLANT
GLOVE SURG LX STRL 8.0 MICRO (GLOVE) ×1 IMPLANT
LENS IOL TECNIS EYHANCE 19.5 (Intraocular Lens) IMPLANT
NDL FILTER BLUNT 18X1 1/2 (NEEDLE) ×1 IMPLANT
NEEDLE FILTER BLUNT 18X1 1/2 (NEEDLE) ×1 IMPLANT
SYR 3ML LL SCALE MARK (SYRINGE) ×1 IMPLANT

## 2023-05-15 NOTE — Op Note (Signed)
 PREOPERATIVE DIAGNOSIS:  Nuclear sclerotic cataract of the right eye.   POSTOPERATIVE DIAGNOSIS:  Cataract   OPERATIVE PROCEDURE:ORPROCALL@   SURGEON:  Susan Manila, MD.   ANESTHESIA:  Anesthesiologist: Marisue Humble, MD CRNA: Lanell Matar, CRNA  1.      Managed anesthesia care. 2.      0.46ml of Shugarcaine was instilled in the eye following the paracentesis.   COMPLICATIONS:  None.   TECHNIQUE:   Stop and chop   DESCRIPTION OF PROCEDURE:  The patient was examined and consented in the preoperative holding area where the aforementioned topical anesthesia was applied to the right eye and then brought back to the Operating Room where the right eye was prepped and draped in the usual sterile ophthalmic fashion and a lid speculum was placed. A paracentesis was created with the side port blade and the anterior chamber was filled with viscoelastic. A near clear corneal incision was performed with the steel keratome. A continuous curvilinear capsulorrhexis was performed with a cystotome followed by the capsulorrhexis forceps. Hydrodissection and hydrodelineation were carried out with BSS on a blunt cannula. The lens was removed in a stop and chop  technique and the remaining cortical material was removed with the irrigation-aspiration handpiece. The capsular bag was inflated with viscoelastic and the Technis [id number]  lens was placed in the capsular bag without complication. The remaining viscoelastic was removed from the eye with the irrigation-aspiration handpiece. The wounds were hydrated. The anterior chamber was flushed with BSS and the eye was inflated to physiologic pressure. 0.43ml of Vigamox was placed in the anterior chamber. The wounds were found to be water tight. The eye was dressed with Combigan. The patient was given protective glasses to wear throughout the day and a shield with which to sleep tonight. The patient was also given drops with which to begin a drop regimen today and  will follow-up with me in one day. Implant Name Type Inv. Item Serial No. Manufacturer Lot No. LRB No. Used Action  LENS IOL TECNIS EYHANCE 19.5 - [id number] Intraocular Lens LENS IOL TECNIS EYHANCE 19.5 [id number] [employer]  Right 1 Implanted   Procedure(s): CATARACT EXTRACTION PHACO AND INTRAOCULAR LENS PLACEMENT (IOC) RIGHT 7.09 00:41.1 (Right)  Electronically signed: Galen [name] 05/15/2023 9:11 AM

## 2023-05-15 NOTE — H&P (Signed)
 [facility]   Primary Care Physician:  Dorothey Baseman, MD Ophthalmologist: Dr. Druscilla Brownie  Pre-Procedure History & Physical: HPI:  Susan [name] is a 89 y.o. female here for cataract surgery.   Past Medical History:  Diagnosis Date   Arthritis    Bradycardia    CAD involving native coronary artery without angina pectoris    Chemotherapy-induced neuropathy (HCC)    Chronic back pain    Coronary artery disease    a. s/p STEMI in 2014 with DES to LAD with staged PCI/DES placement to LCx   Echoencephalogram abnormality    Heart murmur    History of Holter monitoring    History of ST elevation myocardial infarction (STEMI)    Hyperlipidemia    Hypertension    Ischemic cardiomyopathy    LBBB (left bundle branch block)    Mild aortic stenosis by prior echocardiogram    Mild pulmonary hypertension (HCC)    Morbid obesity due to excess calories (HCC)    Myocardial infarction (HCC)    Neuromuscular disorder (HCC)    neuropathy   NSVT (nonsustained ventricular tachycardia) (HCC)    OSA on CPAP    Permanent atrial fibrillation (HCC)    Personal history of chemotherapy    ENDOMETRIAL CA   Personal history of radiation therapy    ENDOMETRIAL CA   Sleep apnea    wears CPAP   Uterine cancer (HCC)    serrous    Past Surgical History:  Procedure Laterality Date   ABDOMINAL HYSTERECTOMY     BREAST EXCISIONAL BIOPSY Right 1956   NEG   CARDIOVERSION N/A 07/27/2017   Procedure: CARDIOVERSION;  Surgeon: Chilton Si, MD;  Location: [facility] ENDOSCOPY;  Service: Cardiovascular;  Laterality: N/A;   CATARACT EXTRACTION W/PHACO Left 05/01/2023   Procedure: CATARACT EXTRACTION PHACO AND INTRAOCULAR LENS PLACEMENT (IOC) LEFT 6.80 00:45.0;  Surgeon: Galen Manila, MD;  Location: [facility];  Service: Ophthalmology;  Laterality: Left;   LEFT HEART CATHETERIZATION WITH CORONARY ANGIOGRAM  10/19/2012   Procedure: LEFT HEART CATHETERIZATION WITH CORONARY ANGIOGRAM;  Surgeon:  Lennette Bihari, MD;  Location: [facility] CATH LAB;  Service: Cardiovascular;;   PERCUTANEOUS CORONARY STENT INTERVENTION (PCI-S)  10/19/2012   Procedure: PERCUTANEOUS CORONARY STENT INTERVENTION (PCI-S);  Surgeon: Lennette Bihari, MD;  Location: [facility] CATH LAB;  Service: Cardiovascular;;   PERCUTANEOUS CORONARY STENT INTERVENTION (PCI-S) N/A 10/23/2012   Procedure: PERCUTANEOUS CORONARY STENT INTERVENTION (PCI-S);  Surgeon: Lennette Bihari, MD;  Location: [facility] CATH LAB;  Service: Cardiovascular;  Laterality: N/A;   PERIPHERAL VASCULAR CATHETERIZATION N/A 05/05/2015   Procedure: Shelda Pal Cath Insertion;  Surgeon: Annice Needy, MD;  Location: [facility] INVASIVE CV LAB;  Service: Cardiovascular;  Laterality: N/A;   PORTA CATH REMOVAL N/A 04/05/2017   Procedure: PORTA CATH REMOVAL;  Surgeon: Annice Needy, MD;  Location: [facility] INVASIVE CV LAB;  Service: Cardiovascular;  Laterality: N/A;    Prior to Admission medications   Medication Sig Start Date End Date Taking? Authorizing Provider  alendronate (FOSAMAX) 70 MG tablet Take 70 mg by mouth once a week.   Yes [provider]  apixaban (ELIQUIS) 5 MG TABS tablet TAKE 1 TABLET 2 (TWO) TIMES DAILY. NEEDS CARDIOLOGY APPT AND LABS FOR ELIQUIS REFILLS, CALL OFFICE 02/05/23  Yes Lennette Bihari, MD  aspirin EC 81 MG tablet Take 81 mg by mouth daily.   Yes [provider]  atorvastatin (LIPITOR) 80 MG tablet TAKE 1 TABLET BY MOUTH DAILY AT 6 PM 04/24/23  Yes  Lennette Bihari, MD  ezetimibe (ZETIA) 10 MG tablet TAKE 1 TABLET BY MOUTH EVERY DAY 11/24/22  Yes Lennette Bihari, MD  fluticasone [facility]) 50 MCG/ACT nasal spray  08/14/17  Yes [provider]  isosorbide mononitrate (IMDUR) 30 MG 24 hr tablet TAKE 1 TABLET BY MOUTH EVERY DAY 02/14/23  Yes Lennette Bihari, MD  losartan (COZAAR) 25 MG tablet TAKE 2 TABLETS BY MOUTH DAILY. PLEASE CALL OFFICE TO SCHEDULE AN APPT FOR FURTHER REFILLS. THANK YOU 02/19/23  Yes Lennette Bihari, MD  Multiple Vitamins-Minerals  (PRESERVISION AREDS 2 PO) Take 2 tablets by mouth daily.   Yes [provider]  NITROSTAT 0.4 MG SL tablet PLACE 1 TABLET UNDER THE TOUGUE EVERY 5 MINUTES FOR UP TO 3 DOSES AS NEEDED FOR CHEST PAIN 01/26/16  Yes Lennette Bihari, MD  omeprazole (PRILOSEC) 20 MG capsule Take 20 mg by mouth daily.   Yes [provider]  loperamide (IMODIUM A-D) 2 MG tablet Take 2 mg by mouth 4 (four) times daily as needed for diarrhea or loose stools.  Patient not taking: Reported on 04/30/2023    [provider]  Multiple Vitamin (MULTIVITAMIN WITH MINERALS) TABS Take 1 tablet by mouth daily. Patient not taking: Reported on 04/30/2023    [provider]    Allergies as of 03/29/2023 - Review Complete 01/04/2023  Allergen Reaction Noted   Ace inhibitors Cough 11/30/2015   Penicillins Rash and Other (See Comments) 10/19/2012    Family History  Problem Relation Age of Onset   Heart disease Mother    Breast cancer Mother 53   Heart disease Father    Diabetes Father    Diabetes Brother    Stroke Brother    Kidney cancer Neg Hx    Bladder Cancer Neg Hx     Social History   Socioeconomic History   Marital status: Widowed    Spouse name: Not on file   Number of children: Not on file   Years of education: Not on file   Highest education level: Not on file  Occupational History   Not on file  Tobacco Use   Smoking status: Former    Current packs/day: 0.00    Types: Cigarettes    Quit date: 15    Years since quitting: 49.1   Smokeless tobacco: Never  Vaping Use   Vaping status: Never Used  Substance and Sexual Activity   Alcohol use: Yes    Alcohol/week: 1.0 standard drink of alcohol    Types: 1 Glasses of wine per week   Drug use: No   Sexual activity: Not Currently  Other Topics Concern   Not on file  Social History Narrative   Are you right handed or left handed? Right Handed    Are you currently employed ? No    What is your current occupation?    Do  you live at home alone? Yes   Who lives with you?    What type of home do you live in: 1 story or 2 story? Lives in a two story home. Lives downstairs        Social Drivers of Health   Financial Resource Strain: Low Risk  (02/28/2023)   Received from [facility]   Overall Financial Resource Strain (CARDIA)    Difficulty of Paying Living Expenses: Not hard at all  Food Insecurity: No Food Insecurity (02/28/2023)   Received from [facility]   Hunger Vital Sign  Worried About Programme researcher, broadcasting/film/video in the Last Year: Never true    Ran Out of Food in the Last Year: Never true  Transportation Needs: No Transportation Needs (02/28/2023)   Received from [facility] - Transportation    In the past 12 months, has lack of transportation kept you from medical appointments or from getting medications?: No    Lack of Transportation (Non-Medical): No  Physical Activity: Not on file  Stress: Not on file  Social Connections: Not on file  Intimate Partner Violence: Not on file    Review of Systems: See HPI, otherwise negative ROS  Physical Exam: BP (!) 152/63   Pulse (!) 53   Temp (!) 97.5 F (36.4 C) (Temporal)   Resp 18   Ht 5\' 2"  (1.575 m)   Wt 90.7 kg   SpO2 95%   BMI 36.58 kg/m  General:   Alert, cooperative in NAD Head:  Normocephalic and atraumatic. Respiratory:  Normal work of breathing. Cardiovascular:  RRR  Impression/Plan: Susan [name] is here for cataract surgery.  Risks, benefits, limitations, and alternatives regarding cataract surgery have been reviewed with the patient.  Questions have been answered.  All parties agreeable.   Galen Manila, MD  05/15/2023, 8:48 AM

## 2023-05-15 NOTE — Anesthesia Postprocedure Evaluation (Signed)
 Anesthesia Post Note  Patient: Susan [name]  Procedure(s) Performed: CATARACT EXTRACTION PHACO AND INTRAOCULAR LENS PLACEMENT (IOC) RIGHT 7.09 00:41.1 (Right)  Patient location during evaluation: PACU Anesthesia Type: MAC Level of consciousness: awake and alert Pain management: pain level controlled Vital Signs Assessment: post-procedure vital signs reviewed and stable Respiratory status: spontaneous breathing, nonlabored ventilation, respiratory function stable and patient connected to nasal cannula oxygen Cardiovascular status: stable and blood pressure returned to baseline Postop Assessment: no apparent nausea or vomiting Anesthetic complications: no   No notable events documented.   Last Vitals:  Vitals:   05/15/23 0914 05/15/23 0915  BP: 137/73 133/72  Pulse: (!) 43 (!) 54  Resp: 12 16  Temp:  (!) 36.2 C  SpO2: 98% 99%    Last Pain:  Vitals:   05/15/23 0915  TempSrc:   PainSc: 0-No pain                 Jamilyn Pigeon C Rosaisela Jamroz

## 2023-05-15 NOTE — Transfer of Care (Signed)
 Immediate Anesthesia Transfer of Care Note  Patient: Susan [name]  Procedure(s) Performed: CATARACT EXTRACTION PHACO AND INTRAOCULAR LENS PLACEMENT (IOC) RIGHT 7.09 00:41.1 (Right)  Patient Location: PACU  Anesthesia Type:MAC  Level of Consciousness: awake, alert , and oriented  Airway & Oxygen Therapy: Patient Spontanous Breathing  Post-op Assessment: Report given to RN, Post -op Vital signs reviewed and stable, and Patient moving all extremities X 4  Post vital signs: Reviewed and stable  Last Vitals:  Vitals Value Taken Time  BP    Temp    Pulse 78 05/15/23 0911  Resp    SpO2 96 % 05/15/23 0911  Vitals shown include unfiled device data.  Last Pain:  Vitals:   05/15/23 0816  TempSrc: Temporal  PainSc: 0-No pain         Complications: No notable events documented.

## 2023-05-16 ENCOUNTER — Encounter: Payer: Self-pay | Admitting: Ophthalmology

## 2023-05-17 ENCOUNTER — Other Ambulatory Visit: Payer: Self-pay | Admitting: Cardiovascular Disease

## 2023-05-17 NOTE — Telephone Encounter (Signed)
 Please contact patient to schedule follow up appointment for additional refills.

## 2023-05-19 ENCOUNTER — Other Ambulatory Visit: Payer: Self-pay | Admitting: Cardiovascular Disease

## 2023-05-31 DIAGNOSIS — D2262 Melanocytic nevi of left upper limb, including shoulder: Secondary | ICD-10-CM | POA: Diagnosis not present

## 2023-05-31 DIAGNOSIS — Z85828 Personal history of other malignant neoplasm of skin: Secondary | ICD-10-CM | POA: Diagnosis not present

## 2023-05-31 DIAGNOSIS — D2261 Melanocytic nevi of right upper limb, including shoulder: Secondary | ICD-10-CM | POA: Diagnosis not present

## 2023-05-31 DIAGNOSIS — D225 Melanocytic nevi of trunk: Secondary | ICD-10-CM | POA: Diagnosis not present

## 2023-05-31 DIAGNOSIS — D2272 Melanocytic nevi of left lower limb, including hip: Secondary | ICD-10-CM | POA: Diagnosis not present

## 2023-06-01 ENCOUNTER — Telehealth: Payer: Self-pay | Admitting: Cardiovascular Disease

## 2023-06-01 ENCOUNTER — Other Ambulatory Visit: Payer: Self-pay | Admitting: Cardiovascular Disease

## 2023-06-01 MED ORDER — ATORVASTATIN CALCIUM 80 MG PO TABS: 80.0000 mg | ORAL_TABLET | Freq: Every day | ORAL | 2 refills | Status: DC

## 2023-06-01 NOTE — Telephone Encounter (Signed)
 Called pt to inform her that she was overdue for an appt with Dr. Tresa Endo and pt stated that she would make an appt to see Dr. Tresa Endo. Waiting on pt to schedule overdue appt.

## 2023-06-01 NOTE — Telephone Encounter (Signed)
*  STAT* If patient is at the pharmacy, call can be transferred to refill team.   1. Which medications need to be refilled? (please list name of each medication and dose if known) atorvastatin (LIPITOR) 80 MG tablet    2. Would you like to learn more about the convenience, safety, & potential cost savings by using the [facility]?      3. Are you open to using the [facility] (Type [facility].  ).   4. Which pharmacy/location (including street and city if local pharmacy) is medication to be sent to? [id number] [address], [place] [phone]    5. Do they need a 30 day or 90 day supply? 90

## 2023-06-04 DIAGNOSIS — Z961 Presence of intraocular lens: Secondary | ICD-10-CM | POA: Diagnosis not present

## 2023-06-07 DIAGNOSIS — M5416 Radiculopathy, lumbar region: Secondary | ICD-10-CM | POA: Diagnosis not present

## 2023-06-07 DIAGNOSIS — M48062 Spinal stenosis, lumbar region with neurogenic claudication: Secondary | ICD-10-CM | POA: Diagnosis not present

## 2023-06-11 DIAGNOSIS — M5416 Radiculopathy, lumbar region: Secondary | ICD-10-CM | POA: Diagnosis not present

## 2023-06-11 DIAGNOSIS — M48062 Spinal stenosis, lumbar region with neurogenic claudication: Secondary | ICD-10-CM | POA: Diagnosis not present

## 2023-06-12 DIAGNOSIS — M1711 Unilateral primary osteoarthritis, right knee: Secondary | ICD-10-CM | POA: Diagnosis not present

## 2023-06-13 ENCOUNTER — Ambulatory Visit: Attending: Cardiovascular Disease | Admitting: Cardiovascular Disease

## 2023-06-13 ENCOUNTER — Encounter: Payer: Self-pay | Admitting: Cardiovascular Disease

## 2023-06-13 DIAGNOSIS — I4821 Permanent atrial fibrillation: Secondary | ICD-10-CM

## 2023-06-13 DIAGNOSIS — G4733 Obstructive sleep apnea (adult) (pediatric): Secondary | ICD-10-CM

## 2023-06-13 DIAGNOSIS — I2510 Atherosclerotic heart disease of native coronary artery without angina pectoris: Secondary | ICD-10-CM | POA: Diagnosis not present

## 2023-06-13 DIAGNOSIS — Z7901 Long term (current) use of anticoagulants: Secondary | ICD-10-CM

## 2023-06-13 DIAGNOSIS — I447 Left bundle-branch block, unspecified: Secondary | ICD-10-CM | POA: Diagnosis not present

## 2023-06-13 DIAGNOSIS — I119 Hypertensive heart disease without heart failure: Secondary | ICD-10-CM

## 2023-06-13 DIAGNOSIS — I252 Old myocardial infarction: Secondary | ICD-10-CM

## 2023-06-13 NOTE — Patient Instructions (Addendum)
 Medication Instructions:  Take one half pill of Furosemide.   STOP taking the Aspirin 81mg . This medication has been removed from your medication list.   *If you need a refill on your cardiac medications before your next appointment, please call your pharmacy*   Lab Work: No labs were ordered during today's visit.  If you have labs (blood work) drawn today and your tests are completely normal, you will receive your results only by: MyChart Message (if you have MyChart) OR A paper copy in the mail If you have any lab test that is abnormal or we need to change your treatment, we will call you to review the results.   Testing/Procedures: No procedures were ordered during today's visit.    Follow-Up: At [facility], you and your health needs are our priority.  As part of our continuing mission to provide you with exceptional heart care, we have created designated Provider Care Teams.  These Care Teams include your primary Cardiologist (physician) and Advanced Practice Providers (APPs -  Physician Assistants and Nurse Practitioners) who all work together to provide you with the care you need, when you need it.  We recommend signing up for the patient portal called "MyChart".  Sign up information is provided on this After Visit Summary.  MyChart is used to connect with patients for Virtual Visits (Telemedicine).  Patients are able to view lab/test results, encounter notes, upcoming appointments, etc.  Non-urgent messages can be sent to your provider as well.   To learn more about what you can do with MyChart, go to [email or web address].    Your next appointment:   6-8 month(s)  Provider:   Dr. Thurmon Fair     Other Instructions [facility]  [address] [place], [place] [address] OPENING APRIL [phone]       1st Floor: - Lobby - Registration  - Pharmacy  - Lab - Cafe   2nd Floor: - PV Lab - Diagnostic Testing (echo, CT, nuclear  med)   3rd Floor: - Vacant   4th Floor: - TCTS (cardiothoracic surgery) - AFib Clinic - Structural Heart Clinic - Vascular Surgery  - Vascular Ultrasound   5th Floor: - [facility] Cardiology (general and EP) - Clinical Pharmacy for coumadin, hypertension, lipid, weight-loss medications, and med management appointments      Valet parking services will be available as well.

## 2023-06-13 NOTE — Progress Notes (Signed)
 Patient ID: Susan [name], female   DOB: 17-Jul-1934, 89 y.o.   MRN: [id number]       HPI: Susan [name] is a 89 y.o. female who presents to the office today for a 5 month follow-up cardiology evaluation.  Susan [name] is originally from [place], [place] and now resides in [place], [place]. On 10/19/2012 she presented [facility] in transfer from [facility] with an anterior wall ST segment elevation myocardial infarction. She had chest pain of greater than 12 hours duration. I performed emergent cardiac catheterization  where her LAD was found to be totally occluded.  She underwent stenting of her LAD extending from the ostium to mid region with 3.0x38 mm and 3.0x16 mm Promus Premier DES stents. Initially she had significant wall motion abnormality involving the LAD territory. She also was found to have a 95% mid AV groove circumflex stenosis.  On 10/23/2012 she underwent successful staged intervention with insertion of a 2.25x12 mm Promus premier DES stent into the circumflex vessel. Subsequently she had done well. She did develop some hypotension leading to reduction in some of her medications.  She was enrolled in the [facility] cardiac rehabilitation program. An exercise Myoview study on 12/03/2012 demonstrated significant salvage of myocardium with only a small area of distal antero-apical, apical, septal and  apical scar without evidence for ischemia. Post-rest ejection fraction was 66% and there was evidence for apical akinesis. Laboratory revealed an increased LDL particle number at 1352 despite being on atorvastatin 80 mg. She was contacted and Zetia 10 mg was just added to this atorvastatin dose. She did have 735 small LDL particles and a calculated LDL of 90 with HDL 40 and triglycerides 119.  She had developed hematuria and was found to have left kidney stones.  She underwent lithotripsy for her multiple kidney stones. A nuclear perfusion study prior to potential  discontinuance of dual anti-platelet therapy on 11/05/2013  was interpreted as low risk, but did show extensive scar in the distal LAD to return distribution.  Post-rest ejection fraction was 40%.  There was evidence for apical akinesis/mild dyskinesis.  She has been documented to have episodes of nonsustained ventricular tachycardia and has seen Dr. Elberta Fortis..  Her bradycardia has been more profound while sleeping.  She was able to exercise and walk without limitation other than back discomfort.  She was evaluated for possible indication for pacemaker was not felt that a pacemaker is indicated presently.  Because of her bradycardia she has not been on beta blocker therapy.  She has had episodes of nonsustained VT noted on a CardioNet monitor.  When I  saw her she admitted that her sleep was poor and nonrestorative.  She snored loudly and required frequent daytime naps.  He was sleeping at least 8 hours per night, but poor quality.  She underwent a split-night protocol sleep study on 03/08/2015 and was found to have severe sleep apnea with an AHI of 35.8 overall and more severe with REM sleep with an AHI of 67.5 per hour.  She had significant oxygen desaturation to a nadir of 77%.  There was loud snoring.  I recommended tai chi CPAP therapy at 9 cm water pressure.  Since initiating CPAP therapy.  She has felt well.  She is no longer having to take naps.  She is using [employer] for DME company.  When I saw her one year ago, she was meeting Medicare compliance. A download was obtained from 05/01/2015 through 05/30/2015 with usage stays at 93%  and 70% of days greater than 4 hours.  Her AHI was excellent at 1.9 with a set pressure of 9 cm.    She was diagnosed with endometrial cancer and underwent a total hysterectomy.  She also has a Port-A-Cath that had undergone chemotherapy and also completed 25 radiation remains to her body and 3 intravaginally..  She has developed a neuropathy for which he takes gabapentin.  He  is unaware of chest pain or palpitations.    When I saw her in 2018 she continued to use CPAP with 100% compliance.  A download from 06/12/2016 through 06/13/2016 100% of usage stays.  She has a ResMed AirSense10 AutoSet unit set at a pressure of 9 cm.  Her AHI is 1.1.  She is using a Radiographer, therapeutic &Paykel Eson nasal mask.  There is no leak.    Since I saw her in March 2018, she has done well from a cardiac standpoint.  She denies chest pain, PND, orthopnea, or palpitations.  She continues to teach water aerobics.  However, she has continued to have issues as result of her radiation and chemotherapy for her endometrial CA.  She has developed a neuropathy and as result, has been walking with a cane or a walker.  Her gabapentin dose has been increased to 3 times per day.  She also has a small fracture in her sacrum.  She underwent initial evaluation at [facility] for possible kyphoplasty, but ultimately this was not done.  She also has degenerative disc disease in L3-L5 for which she has been seen pain management.  She has undergone injection.  She we having an additional injection next week and will need to hold Brilinta.  She also has had radiation-induced urinary incontinence.  A recent CT has demonstrated 5 small nodules in her long and she will be undergoing a follow-up CT in December.  When I saw her in October 2018 a CPAP download  from 11/27/2016 -12/26/2016 confirmed 100% compliance . AHI was1.2 at her 9 cm water pressure.  She was averaging 7 hours and 57 minutes of sleep per night.  An Epworth Sleepiness Scale score was 1.  She subsequently was found to have significant bradycardia on event monitor.  She had heart rates down to the mid to low 30s.  There were 2 episodes where her heart rate dropped to 25 bpm and then 19 bpm.  She was not on any AV nodal blocking drugs.  I recommended that she go to the emergency room for evaluation.  She was admitted overnight.  She was evaluated by Dr. Elberta Fortis the following day.   He reviewed her Holter monitor.  At that time, no AF was appreciated but there were periods of Mobitz 1 block, rates in the 20s with pauses and junctional escape beats.  Her telemetry on that particular day was showing sinus bradycardia in the 40s-50s.  The patient admitted that she was entirely asymptomatic.  At that point the decision was made not to place a permanent pacemaker.  She subsequently saw [facility] in office follow-up in January 2019.  When I saw her in March 2019 she was much more tired and sleepy.  She was not using CPAP since she was coughing.  She also was using Mucinex DM for her chest congestion.  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  She has issues with back discomfort.  During that evaluation, her ECG showed that she was in atrial fibrillation at 67  bpm with left bundle branch block with a rates variable ranging from low 40s to approximately 88.  An echo Doppler study on June 27, 2017 showed an EF of 40 to 45%.  There was mild aortic stenosis with a valve area estimated 1.82 cm.  She had moderate left atrial dilatation.  There was mild pulmonary hypertension with a PA peak pressure 36 mm.    She saw Dr. Elberta Fortis for cardiology/EP follow-up on July 17, 2017.  At that time she was not having any episodes of significant dizziness, presyncope or syncope.  They discussed an attempt at trying to get her back into sinus rhythm and she has been on Eliquis.  She underwent an outpatient successful cardioversion on Jul 27, 2017 for restoration of sinus rhythm at 1 shock at 150 J.   I  saw her in June 2019 at which time she had issues with urinary incontinence as result of damage from radiation treatment.  She admits to 1-2+ ankle swelling.  She is unaware of her cardiac rhythm.  She is now back on CPAP with 100% compliance and is no longer coughing.    She has undergone recent evaluation with Dr. Elberta Fortis on April 17, 2019.   Her ECG showed atrial fibrillation with left bundle branch block.  She denies episodes of palpitations, chest pain shortness of breath orthopnea PND, dizziness presyncope or syncope.  Unfortunately, her husband fell in January and injured his hip.  Following a short hospitalization he was advised that he goes to a rehab center for physical therapy.  Unfortunately he contracted Covid during that time, subsequently was transferred to [facility] and unfortunately died on 05-13-2019 after being on a ventilator with COVID-19 infection.  I saw her in March 2021.  At that time she was having significant difficulty with the recent death of her husband from COVID.  She denied any chest pain PND orthopnea or palpitations.  She is now living by herself.  She continued to use CPAP therapy.  Her insurance has changed and she will need to change her provider which currently is [facility] in [place].  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   I saw her on June 08, 2019 at which time he was having issues with neuropathy and  bladder problems from her radiation treatment.  She also recently underwent Botox administration to her bladder.  She denies any recurrent anginal symptoms or shortness of breath.  She is unaware of palpitations.  She has continued to be on isosorbide 30 mg and losartan 50 mg daily.  She is on Eliquis for anticoagulation.  She continues to be on atorvastatin 80 mg and Zetia 10 mg for hyperlipidemia.  She is followed by Dr. Terance Hart at the [facility] in [place] for primary care.  She continues to use CPAP with 100% compliance.  I obtained a download from February 12 through June 06, 2020 which shows 100% usage with average use at 6 hours and 48 minutes.  At 9 cm set pressure, AHI is excellent at 1.2.   I saw her on October 27, 2021.  Since her prior evaluation she has been wearing a left foot brace due to a  dropfoot.  She has noticed leg swelling right greater than left.  She has continued to use her CPAP machine, but her unit is no longer wireless with her initial set up date in December 2016.  She  brought her machine in today so that we could access data with a card.  She continues to be compliant on a download from December 8 through April 01, 2021 with average use at 6 hours and 26 minutes.  At  9 cm set pressure AHI is 1.3.  She continues to have issues with her neuropathy.  She denied chest pain or palpitations.  He received a new ResMed AirSense 11 AutoSet unit on September 15, 2021.  She is now using [employer] as her DME company.  I last saw her on January 04, 2023. She continued to feel well.  She remains active and does water aerobics on Monday Wednesday and Friday.  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  She has neuropathy and continues to have foot drop of her left foot.  Outside the house, she walks with a cane.  She continues to be on Eliquis for anticoagulation with her atrial fibrillation.  She is on isosorbide 30 mg and losartan 25 mg daily for hypertension and her CAD.  She is on atorvastatin 80 mg and Zetia 10 mg for hyperlipidemia.  She takes omeprazole daily as well as Flonase spray in her nostrils.  She is sleeping well.  She denies any residual daytime sleepiness.  A download was obtained in the office today from September 4 through December 28, 2022 which shows 100% usage of her CPAP.  Her CPAP is set at 9 cm set pressure.  AHI is excellent at 1.1.    Presently, Susan [name] has had issues with neuropathy and sciatica.  She received an injection to her right knee yesterday.  She has tried to continue doing water aerobics.  She will be going to [place] with her granddaughter.  She tolerated cataract surgery last month.  She admits to ankle swelling right greater than left and has been using support stockings.  She has been in atrial fibrillation with controlled ventricular  rate.  She continues to use CPAP.  Download was obtained from February 14 through June 09, 2023.  Usage days is 100% which average use at 6 hours and 45 minutes.  Her ResMed air sense 11 AutoSet unit is set at a pressure of 9 cm.  AHI is excellent at 1.3.  She does not have any mask leak.  She feels great from a sleep apnea perspective. She presents for evaluation.   Past Medical History:  Diagnosis Date   Arthritis    Bradycardia    CAD involving native coronary artery without angina pectoris    Chemotherapy-induced neuropathy (HCC)    Chronic back pain    Coronary artery disease    a. s/p STEMI in 2014 with DES to LAD with staged PCI/DES placement to LCx   Echoencephalogram abnormality    Heart murmur    History of Holter monitoring    History of ST elevation myocardial infarction (STEMI)    Hyperlipidemia    Hypertension    Ischemic cardiomyopathy    LBBB (left bundle branch block)    Mild aortic stenosis by prior echocardiogram    Mild pulmonary hypertension (HCC)    Morbid obesity due to excess calories (HCC)    Myocardial infarction (HCC)    Neuromuscular disorder (HCC)    neuropathy   NSVT (nonsustained ventricular tachycardia) (HCC)    OSA on CPAP    Permanent atrial fibrillation (HCC)    Personal history of chemotherapy    ENDOMETRIAL CA   Personal history of radiation therapy  ENDOMETRIAL CA   Sleep apnea    wears CPAP   Uterine cancer (HCC)    serrous    Past Surgical History:  Procedure Laterality Date   ABDOMINAL HYSTERECTOMY     BREAST EXCISIONAL BIOPSY Right 1956   NEG   CARDIOVERSION N/A 07/27/2017   Procedure: CARDIOVERSION;  Surgeon: Chilton Si, MD;  Location[facility] ENDOSCOPY;  Service: Cardiovascular;  Laterality: N/A;   CATARACT EXTRACTION W/PHACO Left 05/01/2023   Procedure: CATARACT EXTRACTION PHACO AND INTRAOCULAR LENS PLACEMENT (IOC) LEFT 6.80 00:45.0;  Surgeon: Galen Manila, MD;  Location: [facility];  Service: Ophthalmology;   Laterality: Left;   CATARACT EXTRACTION W/PHACO Right 05/15/2023   Procedure: CATARACT EXTRACTION PHACO AND INTRAOCULAR LENS PLACEMENT (IOC) RIGHT 7.09 00:41.1;  Surgeon: Galen Manila, MD;  Location: [facility];  Service: Ophthalmology;  Laterality: Right;   LEFT HEART CATHETERIZATION WITH CORONARY ANGIOGRAM  10/19/2012   Procedure: LEFT HEART CATHETERIZATION WITH CORONARY ANGIOGRAM;  Surgeon: Lennette Bihari, MD;  Location: [facility] CATH LAB;  Service: Cardiovascular;;   PERCUTANEOUS CORONARY STENT INTERVENTION (PCI-S)  10/19/2012   Procedure: PERCUTANEOUS CORONARY STENT INTERVENTION (PCI-S);  Surgeon: Lennette Bihari, MD;  Location: [facility] CATH LAB;  Service: Cardiovascular;;   PERCUTANEOUS CORONARY STENT INTERVENTION (PCI-S) N/A 10/23/2012   Procedure: PERCUTANEOUS CORONARY STENT INTERVENTION (PCI-S);  Surgeon: Lennette Bihari, MD;  Location: [facility] CATH LAB;  Service: Cardiovascular;  Laterality: N/A;   PERIPHERAL VASCULAR CATHETERIZATION N/A 05/05/2015   Procedure: Shelda Pal Cath Insertion;  Surgeon: Annice Needy, MD;  Location: [facility] INVASIVE CV LAB;  Service: Cardiovascular;  Laterality: N/A;   PORTA CATH REMOVAL N/A 04/05/2017   Procedure: PORTA CATH REMOVAL;  Surgeon: Annice Needy, MD;  Location: [facility] INVASIVE CV LAB;  Service: Cardiovascular;  Laterality: N/A;    Allergies  Allergen Reactions   Ace Inhibitors Cough   Penicillins Rash and Other (See Comments)    Has patient had a PCN reaction causing immediate rash, facial/tongue/throat swelling, SOB or lightheadedness with hypotension: No Has patient had a PCN reaction causing severe rash involving mucus membranes or skin necrosis: No Has patient had a PCN reaction that required hospitalization: No Has patient had a PCN reaction occurring within the last 10 years: No If all of the above answers are "NO", then may proceed with Cephalosporin use.     Current Outpatient Medications  Medication Sig Dispense Refill   alendronate (FOSAMAX) 70 MG  tablet Take 70 mg by mouth once a week.     apixaban (ELIQUIS) 5 MG TABS tablet TAKE 1 TABLET 2 (TWO) TIMES DAILY. NEEDS CARDIOLOGY APPT AND LABS FOR ELIQUIS REFILLS, CALL OFFICE 15 tablet 0   atorvastatin (LIPITOR) 80 MG tablet Take 1 tablet (80 mg total) by mouth daily. 90 tablet 2   ezetimibe (ZETIA) 10 MG tablet TAKE 1 TABLET BY MOUTH EVERY DAY 90 tablet 3   fluticasone (FLONASE) 50 MCG/ACT nasal spray      isosorbide mononitrate (IMDUR) 30 MG 24 hr tablet TAKE 1 TABLET BY MOUTH EVERY DAY 90 tablet 3   loperamide (IMODIUM A-D) 2 MG tablet Take 2 mg by mouth 4 (four) times daily as needed for diarrhea or loose stools.     losartan (COZAAR) 25 MG tablet TAKE 2 TABLETS BY MOUTH DAILY. PLEASE CALL OFFICE TO SCHEDULE AN APPT FOR FURTHER REFILLS. THANK YOU 30 tablet 0   Multiple Vitamin (MULTIVITAMIN WITH MINERALS) TABS Take 1 tablet by mouth daily.     Multiple Vitamins-Minerals (PRESERVISION AREDS 2  PO) Take 2 tablets by mouth daily.     NITROSTAT 0.4 MG SL tablet PLACE 1 TABLET UNDER THE TOUGUE EVERY 5 MINUTES FOR UP TO 3 DOSES AS NEEDED FOR CHEST PAIN 25 tablet 0   omeprazole (PRILOSEC) 20 MG capsule Take 20 mg by mouth daily.     No current facility-administered medications for this visit.   Facility-Administered Medications Ordered in Other Visits  Medication Dose Route Frequency Provider Last Rate Last Admin   sodium chloride flush (NS) 0.9 % injection 10 mL  10 mL Intravenous PRN Louretta Shorten R, MD   10 mL at 06/06/16 1447   sodium chloride flush (NS) 0.9 % injection 10 mL  10 mL Intravenous PRN Earna Coder, MD   10 mL at 08/15/16 1404    Social History   Socioeconomic History   Marital status: Widowed    Spouse name: Not on file   Number of children: Not on file   Years of education: Not on file   Highest education level: Not on file  Occupational History   Not on file  Tobacco Use   Smoking status: Former    Current packs/day: 0.00    Types: Cigarettes     Quit date: 18    Years since quitting: 49.2   Smokeless tobacco: Never  Vaping Use   Vaping status: Never Used  Substance and Sexual Activity   Alcohol use: Yes    Alcohol/week: 1.0 standard drink of alcohol    Types: 1 Glasses of wine per week   Drug use: No   Sexual activity: Not Currently  Other Topics Concern   Not on file  Social History Narrative   Are you right handed or left handed? Right Handed    Are you currently employed ? No    What is your current occupation?    Do you live at home alone? Yes   Who lives with you?    What type of home do you live in: 1 story or 2 story? Lives in a two story home. Lives downstairs        Social Drivers of Health   Financial Resource Strain: Low Risk  (06/12/2023)   Received from [facility]   Overall Financial Resource Strain (CARDIA)    Difficulty of Paying Living Expenses: Not hard at all  Food Insecurity: No Food Insecurity (06/12/2023)   Received from [facility]   Hunger Vital Sign    Worried About Running Out of Food in the Last Year: Never true    Ran Out of Food in the Last Year: Never true  Transportation Needs: No Transportation Needs (06/12/2023)   Received from [facility] - Transportation    In the past 12 months, has lack of transportation kept you from medical appointments or from getting medications?: No    Lack of Transportation (Non-Medical): No  Physical Activity: Not on file  Stress: Not on file  Social Connections: Not on file  Intimate Partner Violence: Not on file    Socially she is widowed. There is no tobacco use. She drinks alcohol rarely.  Family History  Problem Relation Age of Onset   Heart disease Mother    Breast cancer Mother 68   Heart disease Father    Diabetes Father    Diabetes Brother    Stroke Brother    Kidney cancer Neg Hx    Bladder Cancer Neg Hx  ROS General: Negative; No fevers, chills, or night sweats;   HEENT: Negative; No changes in vision or hearing, sinus congestion, difficulty swallowing Pulmonary: Negative; No cough, wheezing, shortness of breath, hemoptysis Cardiovascular:  See HPI GI: Positive for GERD; No nausea, vomiting, diarrhea, or abdominal pain LO:[id number] of previous kidney stones, contributing to left flank pain intermittently; No dysuria, hematuria, or difficulty voiding; radiation-induced urinary incontinence Musculoskeletal: Positive for small sacral fracture, L3-L5 degenerative disc disease. Hematologic/Oncology: Positive for endometrial cancer, status post chemotherapy and radiation treatments. Endocrine: Negative; no heat/cold intolerance; no diabetes Neuro: Neuropathy, left foot drop Skin: Negative; No rashes or skin lesions Psychiatric: Negative; No behavioral problems, depression Sleep: Positive for obstructive sleep apnea, on CPAP therapy.  No breakthrough snoring.  No daytime sleepiness. Other comprehensive 14 point system review is negative.   PE BP 130/82   Pulse (!) 57   Ht 5' 2.5" (1.588 m)   Wt 198 lb 6.4 oz (90 kg)   SpO2 98%   BMI 35.71 kg/m    Repeat blood pressure by me was 130/76  Wt Readings from Last 3 Encounters:  06/13/23 198 lb 6.4 oz (90 kg)  05/15/23 200 lb (90.7 kg)  05/01/23 199 lb (90.3 kg)   General: Alert, oriented, no distress.  Skin: normal turgor, no rashes, warm and dry HEENT: Normocephalic, atraumatic. Pupils equal round and reactive to light; sclera anicteric; extraocular muscles intact;  Nose without nasal septal hypertrophy Mouth/Parynx benign; Mallinpatti scale 3 Neck: No JVD, no carotid bruits; normal carotid upstroke Lungs: clear to ausculatation and percussion; no wheezing or rales Chest wall: without tenderness to palpitation Heart: PMI not displaced, RRR, s1 s2 normal, 1/6 systolic murmur, no diastolic murmur, no rubs, gallops, thrills, or heaves Abdomen: soft, nontender; no hepatosplenomehaly, BS+; abdominal  aorta nontender and not dilated by palpation. Back: no CVA tenderness Pulses 2+ Musculoskeletal: full range of motion, normal strength, no joint deformities Extremities: no clubbing cyanosis or edema, Homan's sign negative  Neurologic: grossly nonfocal; Cranial nerves grossly wnl Psychologic: Normal mood and affect  EKG Interpretation Date/Time:  Wednesday June 13 2023 12:29:48 EDT Ventricular Rate:  58 PR Interval:    QRS Duration:  146 QT Interval:  428 QTC Calculation: 420 R Axis:   -38  Text Interpretation: Atrial fibrillation with slow ventricular response Left axis deviation Left bundle branch block When compared with ECG of 04-Jan-2023 12:02, QRS axis Shifted left T wave inversion no longer evident in Inferior leads T wave inversion more evident in Lateral leads Confirmed by Nicki Guadalajara ([id number]) on 06/18/2023 4:56:51 PM    January 04, 2023 ECG (independently read by me): Atrial fibrillation with ventricular rate of 58 bpm.  IVCD.  April 01, 2021 ECG (independently read by me):  Atrial fibrillation at 56, LBBB  March 2021 ECG (independently read by me): Atrial fibrillation at 70; LBBB, QTc 453  June 2019 ECG (independently read by me): Atrial fibrillation with ventricular rate at 53 bpm.  Left bundle branch block with repolarization changes.  QTc interval 4 1 ms.  June 15, 2017 ECG (independently read by me): Atrial fibrillation at 67 bpm.  Left bundle branch block. Variable rate ranging from probably in the , low 40s to approximately 88  October 2018 ECG (independently read by me): Marked sinus bradycardia with PACs.  Ventricular rate at 43.  Left bundle branch block with repolarization changes.  March 2018 ECG (independently read by me): Sinus bradycardia 57 bpm.  First-degree AV block with a PR interval 314 ms. [name]  She now has a left bundle-branch block which is new since her prior tracing.  March 2017 ECG (independently read by me): Sinus bradycardia 52 bpm with  first-degree block.  PR interval 284 ms.  LVH by voltage.  QS V1 through V3 concordant with old anteroseptal MI.  March 2016 ECG (independently read by me): Normal sinus rhythm.  Old anteroseptal MI.  First-degree AV block.  October 27, 2013 ECG (independently read by me): Normal sinus rhythm at 60 beats per minute.  First degree AV block with PR interval at 276 ms.  Old anteroseptal MI with QS complex in V1 and V2.  EKG (independently read by me): Sinus rhythm at 55 beats per minute. First degree AV block. Poor progression compatible with her prior septal infarction. T-wave changes anterolaterally.  LABS:    Latest Ref Rng & Units 12/15/2019    9:17 AM 02/26/2019    2:32 PM 12/16/2018   10:32 AM  BMP  Glucose 70 - 99 mg/dL 161  096  045   BUN 8 - 23 mg/dL 13  18  19    Creatinine 0.44 - 1.00 mg/dL 4.09  8.11  9.14   Sodium 135 - 145 mmol/L 141  137  140   Potassium 3.5 - 5.1 mmol/L 3.8  3.7  3.8   Chloride 98 - 111 mmol/L 106  103  105   CO2 22 - 32 mmol/L 27  26  26    Calcium 8.9 - 10.3 mg/dL 9.4  9.5  9.5       Latest Ref Rng & Units 12/15/2019    9:17 AM 02/26/2019    2:32 PM 12/16/2018   10:32 AM  Hepatic Function  Total Protein 6.5 - 8.1 g/dL 7.1  6.9  7.0   Albumin 3.5 - 5.0 g/dL 4.0  4.0  4.0   AST 15 - 41 U/L 25  25  25    ALT 0 - 44 U/L 14  14  18    Alk Phosphatase 38 - 126 U/L 92  92  91   Total Bilirubin 0.3 - 1.2 mg/dL 0.8  0.9  0.7       Latest Ref Rng & Units 12/15/2019    9:17 AM 02/26/2019    2:32 PM 12/16/2018   10:32 AM  CBC  WBC 4.0 - 10.5 K/uL 6.1  5.6  6.0   Hemoglobin 12.0 - 15.0 g/dL 78.2  95.6  21.3   Hematocrit 36.0 - 46.0 % 40.6  38.7  38.1   Platelets 150 - 400 K/uL 222  213  198    Lab Results  Component Value Date   MCV 90.4 12/15/2019   MCV 93.7 02/26/2019   MCV 92.5 12/16/2018   Lab Results  Component Value Date   TSH 0.97 06/19/2016   Lab Results  Component Value Date   HGBA1C 5.7 (H) 10/19/2012   Lipid Panel     Component Value  Date/Time   CHOL 116 06/06/2017 0922   CHOL 141 04/16/2013 0924   TRIG 75 06/06/2017 0922   TRIG 112 04/16/2013 0924   HDL 51 06/06/2017 0922   HDL 53 04/16/2013 0924   CHOLHDL 2.3 06/06/2017 0922   CHOLHDL 2.2 06/19/2016 0950   VLDL 20 06/19/2016 0950   LDLCALC 50 06/06/2017 0922   LDLCALC 66 04/16/2013 0924   RADIOLOGY: No results found.  IMPRESSION:  1. Old MI (myocardial infarction): Anterior STEMI 10/19/2012   2. Coronary artery disease involving native coronary artery of native heart  without angina pectoris   3. Permanent atrial fibrillation (HCC)   4. Hypertensive heart disease without CHF   5. OSA (obstructive sleep apnea)   6. Anticoagulation adequate   7. Left bundle branch block     ASSESSMENT AND PLAN: Susan [name] is an 89 year-old young appearing female who presented to [facility] on 10/19/2012 with a STEMI after an approximate 12 hour delay with back and chest discomfort. When she arrived to [facility] she had excellent door to balloon time at 27 minutes in the setting of an anterior wall ST segment elevation myocardial infarction. She underwent successful stenting of her LAD and because of diffuse disease tandem DES stents were inserted.  She underwent staged intervention to the circumflex 4 days later. She had concomitant 50 - 60% RCA narrowing and a large dominant right coronary artery.  She has a history of ACE inhibitor induced cough, but has tolerated ARB therapy with losartan .  A nuclear perfusion study  demonstrated significant salvage of myocardium in the majority of the LAD territory and showed distal apical scar.  She has chronic left bundle branch block.  She has permanent atrial fibrillation and has been on Eliquis for anticoagulation. Her cha2ds2vasc score is 4.  An echo revealed an EF of 40 to 45% with moderate LVH and septal and apical akinesis secondary to her prior late presentation anterior MI.  There was very mild aortic stenosis.  She had  moderate left atrial dilatation.  She has continued to do well without anginal symptomatology.  She has had some issues with neuropathy and sciatica.  She does water aerobics.  She recently underwent injection to her right knee.  She has noticed some lower extremity edema and apparently had stopped her diuretic therapy.  I have recommended revision of support stockings and also recommended reinstitution of furosemide to take half dose 2 to 3 days/week for swelling.  She continues to be anticoagulated with Eliquis 5 mg twice a day.  Blood pressure stable on losartan 50 mg daily and low-dose isosorbide 30 mg.  She continues to use CPAP ResMed AirSense 11 AutoSet unit set at a pressure of 9.  AHI is excellent at 1.3 with 100% use.  I have recommended discontinuance of aspirin and for her to continue just with Eliquis along.  She continues to be on atorvastatin 80 mg and Zetia 10 mg for hyperlipidemia.  I discussed with her my plans for retirement this year.  I will transition her to the care of Dr. Royann Shivers to be seen in the proximal 6 to 8 months.  From a sleep she will see Dr. Mayford Knife as necessary.    Lennette Bihari, MD, [facility], ABSM Diplomate, [employer] Board of Sleep Medicine   06/18/2023 5:12 PM

## 2023-06-20 DIAGNOSIS — M48062 Spinal stenosis, lumbar region with neurogenic claudication: Secondary | ICD-10-CM | POA: Diagnosis not present

## 2023-06-20 DIAGNOSIS — M5416 Radiculopathy, lumbar region: Secondary | ICD-10-CM | POA: Diagnosis not present

## 2023-06-26 DIAGNOSIS — M5416 Radiculopathy, lumbar region: Secondary | ICD-10-CM | POA: Diagnosis not present

## 2023-06-26 DIAGNOSIS — M48062 Spinal stenosis, lumbar region with neurogenic claudication: Secondary | ICD-10-CM | POA: Diagnosis not present

## 2023-06-27 ENCOUNTER — Other Ambulatory Visit: Payer: Self-pay | Admitting: Cardiovascular Disease

## 2023-06-29 DIAGNOSIS — G4733 Obstructive sleep apnea (adult) (pediatric): Secondary | ICD-10-CM | POA: Diagnosis not present

## 2023-07-03 DIAGNOSIS — M48062 Spinal stenosis, lumbar region with neurogenic claudication: Secondary | ICD-10-CM | POA: Diagnosis not present

## 2023-07-03 DIAGNOSIS — M5416 Radiculopathy, lumbar region: Secondary | ICD-10-CM | POA: Diagnosis not present

## 2023-07-05 DIAGNOSIS — M48062 Spinal stenosis, lumbar region with neurogenic claudication: Secondary | ICD-10-CM | POA: Diagnosis not present

## 2023-07-05 DIAGNOSIS — M5416 Radiculopathy, lumbar region: Secondary | ICD-10-CM | POA: Diagnosis not present

## 2023-07-10 DIAGNOSIS — M48062 Spinal stenosis, lumbar region with neurogenic claudication: Secondary | ICD-10-CM | POA: Diagnosis not present

## 2023-07-10 DIAGNOSIS — M5416 Radiculopathy, lumbar region: Secondary | ICD-10-CM | POA: Diagnosis not present

## 2023-07-12 DIAGNOSIS — M48062 Spinal stenosis, lumbar region with neurogenic claudication: Secondary | ICD-10-CM | POA: Diagnosis not present

## 2023-07-12 DIAGNOSIS — M5416 Radiculopathy, lumbar region: Secondary | ICD-10-CM | POA: Diagnosis not present

## 2023-07-17 DIAGNOSIS — M5416 Radiculopathy, lumbar region: Secondary | ICD-10-CM | POA: Diagnosis not present

## 2023-07-17 DIAGNOSIS — M48062 Spinal stenosis, lumbar region with neurogenic claudication: Secondary | ICD-10-CM | POA: Diagnosis not present

## 2023-07-19 ENCOUNTER — Other Ambulatory Visit: Payer: Self-pay | Admitting: Family Medicine

## 2023-07-19 DIAGNOSIS — M5416 Radiculopathy, lumbar region: Secondary | ICD-10-CM | POA: Diagnosis not present

## 2023-07-19 DIAGNOSIS — Z1231 Encounter for screening mammogram for malignant neoplasm of breast: Secondary | ICD-10-CM

## 2023-07-19 DIAGNOSIS — M48062 Spinal stenosis, lumbar region with neurogenic claudication: Secondary | ICD-10-CM | POA: Diagnosis not present

## 2023-07-24 DIAGNOSIS — M5416 Radiculopathy, lumbar region: Secondary | ICD-10-CM | POA: Diagnosis not present

## 2023-07-24 DIAGNOSIS — M48062 Spinal stenosis, lumbar region with neurogenic claudication: Secondary | ICD-10-CM | POA: Diagnosis not present

## 2023-07-26 DIAGNOSIS — M48062 Spinal stenosis, lumbar region with neurogenic claudication: Secondary | ICD-10-CM | POA: Diagnosis not present

## 2023-07-26 DIAGNOSIS — M5416 Radiculopathy, lumbar region: Secondary | ICD-10-CM | POA: Diagnosis not present

## 2023-07-30 ENCOUNTER — Telehealth: Payer: Self-pay | Admitting: Cardiovascular Disease

## 2023-07-30 NOTE — Telephone Encounter (Signed)
 Pt daughter called to inform Dr. Loetta Ringer that pt passed away this past 08-23-23 09-Aug-2023 (message sent to HIM). She asked if Dr. Loetta Ringer saw anything abnormal on her breathing machine that could have caused it. Please advise.

## 2023-08-26 DEATH — deceased

## 2023-09-10 ENCOUNTER — Encounter

## 2023-09-18 ENCOUNTER — Ambulatory Visit: Payer: Medicare PPO | Admitting: Nurse Practitioner

## 2023-09-18 ENCOUNTER — Other Ambulatory Visit: Payer: Medicare PPO
